# Patient Record
Sex: Female | Born: 1935
Health system: Southern US, Community
[De-identification: ages and names within clinical notes are randomized; demographics above are authoritative.]

## PROBLEM LIST (undated history)

## (undated) ENCOUNTER — Ambulatory Visit (HOSPITAL_COMMUNITY): Admission: EM | Source: Home / Self Care

## (undated) DIAGNOSIS — Z950 Presence of cardiac pacemaker: Secondary | ICD-10-CM

## (undated) DIAGNOSIS — I10 Essential (primary) hypertension: Secondary | ICD-10-CM

## (undated) DIAGNOSIS — I2510 Atherosclerotic heart disease of native coronary artery without angina pectoris: Secondary | ICD-10-CM

## (undated) DIAGNOSIS — I252 Old myocardial infarction: Secondary | ICD-10-CM

## (undated) DIAGNOSIS — N183 Chronic kidney disease, stage 3 unspecified: Secondary | ICD-10-CM

## (undated) DIAGNOSIS — Z87440 Personal history of urinary (tract) infections: Secondary | ICD-10-CM

## (undated) DIAGNOSIS — Z951 Presence of aortocoronary bypass graft: Secondary | ICD-10-CM

## (undated) DIAGNOSIS — Z955 Presence of coronary angioplasty implant and graft: Secondary | ICD-10-CM

## (undated) DIAGNOSIS — R55 Syncope and collapse: Secondary | ICD-10-CM

## (undated) DIAGNOSIS — E039 Hypothyroidism, unspecified: Secondary | ICD-10-CM

## (undated) DIAGNOSIS — E785 Hyperlipidemia, unspecified: Secondary | ICD-10-CM

## (undated) DIAGNOSIS — I480 Paroxysmal atrial fibrillation: Secondary | ICD-10-CM

## (undated) HISTORY — DX: Syncope and collapse: R55

## (undated) HISTORY — PX: TOTAL HIP ARTHROPLASTY: SHX124

## (undated) HISTORY — DX: Presence of coronary angioplasty implant and graft: Z95.5

## (undated) HISTORY — DX: Hypothyroidism, unspecified: E03.9

## (undated) HISTORY — DX: Chronic kidney disease, stage 3 unspecified: N18.30

## (undated) HISTORY — PX: THYROIDECTOMY: SHX17

## (undated) HISTORY — PX: CHOLECYSTECTOMY: SHX55

## (undated) HISTORY — PX: JOINT REPLACEMENT: SHX530

## (undated) HISTORY — DX: Hyperlipidemia, unspecified: E78.5

## (undated) HISTORY — DX: Chronic kidney disease, stage 3 (moderate): N18.3

## (undated) HISTORY — DX: Paroxysmal atrial fibrillation: I48.0

---

## 1997-08-11 ENCOUNTER — Ambulatory Visit (HOSPITAL_COMMUNITY): Admission: RE | Admit: 1997-08-11 | Discharge: 1997-08-11 | Payer: Self-pay

## 1997-10-05 ENCOUNTER — Ambulatory Visit (HOSPITAL_BASED_OUTPATIENT_CLINIC_OR_DEPARTMENT_OTHER): Admission: RE | Admit: 1997-10-05 | Discharge: 1997-10-05 | Payer: Self-pay | Admitting: *Deleted

## 1998-08-06 ENCOUNTER — Emergency Department (HOSPITAL_COMMUNITY): Admission: EM | Admit: 1998-08-06 | Discharge: 1998-08-06 | Payer: Self-pay | Admitting: Emergency Medicine

## 1999-11-30 ENCOUNTER — Encounter: Admission: RE | Admit: 1999-11-30 | Discharge: 1999-11-30 | Payer: Self-pay

## 2000-02-01 ENCOUNTER — Encounter: Admission: RE | Admit: 2000-02-01 | Discharge: 2000-02-01 | Payer: Self-pay

## 2001-02-15 ENCOUNTER — Emergency Department (HOSPITAL_COMMUNITY): Admission: EM | Admit: 2001-02-15 | Discharge: 2001-02-15 | Payer: Self-pay | Admitting: Emergency Medicine

## 2001-07-29 ENCOUNTER — Ambulatory Visit (HOSPITAL_COMMUNITY): Admission: RE | Admit: 2001-07-29 | Discharge: 2001-07-29 | Payer: Self-pay | Admitting: *Deleted

## 2001-07-29 ENCOUNTER — Encounter (INDEPENDENT_AMBULATORY_CARE_PROVIDER_SITE_OTHER): Payer: Self-pay | Admitting: Specialist

## 2002-01-13 ENCOUNTER — Encounter: Admission: RE | Admit: 2002-01-13 | Discharge: 2002-01-13 | Payer: Self-pay

## 2003-01-31 ENCOUNTER — Inpatient Hospital Stay (HOSPITAL_COMMUNITY): Admission: RE | Admit: 2003-01-31 | Discharge: 2003-02-03 | Payer: Self-pay | Admitting: Orthopaedic Surgery

## 2003-02-03 ENCOUNTER — Inpatient Hospital Stay (HOSPITAL_COMMUNITY)
Admission: RE | Admit: 2003-02-03 | Discharge: 2003-02-11 | Payer: Self-pay | Admitting: Physical Medicine & Rehabilitation

## 2004-01-25 ENCOUNTER — Encounter (INDEPENDENT_AMBULATORY_CARE_PROVIDER_SITE_OTHER): Payer: Self-pay | Admitting: *Deleted

## 2004-01-25 ENCOUNTER — Ambulatory Visit (HOSPITAL_COMMUNITY): Admission: RE | Admit: 2004-01-25 | Discharge: 2004-01-25 | Payer: Self-pay | Admitting: *Deleted

## 2004-02-26 DIAGNOSIS — Z951 Presence of aortocoronary bypass graft: Secondary | ICD-10-CM

## 2004-02-26 HISTORY — PX: CORONARY ARTERY BYPASS GRAFT: SHX141

## 2004-02-26 HISTORY — DX: Presence of aortocoronary bypass graft: Z95.1

## 2004-04-23 ENCOUNTER — Inpatient Hospital Stay (HOSPITAL_COMMUNITY): Admission: EM | Admit: 2004-04-23 | Discharge: 2004-05-09 | Payer: Self-pay | Admitting: Emergency Medicine

## 2004-04-25 ENCOUNTER — Encounter (INDEPENDENT_AMBULATORY_CARE_PROVIDER_SITE_OTHER): Payer: Self-pay | Admitting: Cardiology

## 2004-05-29 ENCOUNTER — Encounter: Admission: RE | Admit: 2004-05-29 | Discharge: 2004-05-29 | Payer: Self-pay | Admitting: Cardiothoracic Surgery

## 2004-06-04 ENCOUNTER — Encounter (HOSPITAL_COMMUNITY): Admission: RE | Admit: 2004-06-04 | Discharge: 2004-09-02 | Payer: Self-pay | Admitting: Cardiology

## 2004-06-28 ENCOUNTER — Encounter
Admission: RE | Admit: 2004-06-28 | Discharge: 2004-06-28 | Payer: Self-pay | Admitting: Thoracic Surgery (Cardiothoracic Vascular Surgery)

## 2005-07-25 ENCOUNTER — Emergency Department (HOSPITAL_COMMUNITY): Admission: EM | Admit: 2005-07-25 | Discharge: 2005-07-25 | Payer: Self-pay | Admitting: Family Medicine

## 2006-09-29 ENCOUNTER — Emergency Department (HOSPITAL_COMMUNITY): Admission: EM | Admit: 2006-09-29 | Discharge: 2006-09-29 | Payer: Self-pay | Admitting: Pediatrics

## 2008-07-07 ENCOUNTER — Encounter (INDEPENDENT_AMBULATORY_CARE_PROVIDER_SITE_OTHER): Payer: Self-pay | Admitting: *Deleted

## 2008-07-07 ENCOUNTER — Ambulatory Visit (HOSPITAL_COMMUNITY): Admission: RE | Admit: 2008-07-07 | Discharge: 2008-07-07 | Payer: Self-pay | Admitting: *Deleted

## 2008-11-29 HISTORY — PX: NM MYOVIEW LTD: HXRAD82

## 2009-07-06 ENCOUNTER — Encounter: Admission: RE | Admit: 2009-07-06 | Discharge: 2009-07-06 | Payer: Self-pay | Admitting: Internal Medicine

## 2010-02-25 HISTORY — PX: CAROTID STENT: SHX1301

## 2010-07-10 NOTE — Op Note (Signed)
NAME:  Stephanie Atkinson, Stephanie Atkinson                 ACCOUNT NO.:  [id number]   MEDICAL RECORD NO.:  [id number]          PATIENT TYPE:  AMB   LOCATION:  ENDO                         FACILITY:  [facility]   PHYSICIAN:  Georgiana Spinner, M.D.    DATE OF BIRTH:  19-Jul-1924   DATE OF PROCEDURE:  07/07/2008  DATE OF DISCHARGE:                               OPERATIVE REPORT   PROCEDURE:  Upper endoscopy with biopsy.   ENDOSCOPIST:  Georgiana Spinner, M.D.   INDICATIONS:  Barrett's esophagus.   ANESTHESIA:  Fentanyl 50 mcg, Versed 5 mg.   DESCRIPTION OF PROCEDURE:  With the patient mildly sedated in the left  lateral decubitus position, the Pentax videoscopic endoscope was  inserted in the mouth and passed under direct vision through the  esophagus, which appeared normal.  We entered into the stomach and  placed the endoscope in a  retroflexed view.  We could see that there  was an area of Barrett's esophagus and actually some small areas above  the squamocolumnar junction line.  This was photographed and biopsied in  retroflex view.  The endoscope was then straightened and advanced  through the stomach, fundus, body and antrum.  The duodenal bulb was  visualized, as was the second portion of the duodenum.  All appeared  normal.  From this point the endoscope was slowly withdrawn, taking  circumferential views of the duodenal mucosa, until the endoscope was  pulled back into the stomach, placed in retroflexion to view the stomach  from below. The endoscope was straightened and withdrawn, taking  circumferential views of the remaining gastric and esophageal mucosa.  The patient's vital signs and pulse oximeter remained stable.   The patient tolerated procedure well without apparent complications.   FINDINGS:  Barrett's esophagus was biopsied in the retroflexed view.  Otherwise unremarkable exam.   PLAN:  Await biopsy report.  The patient will call me for results and  follow up with me as an outpatient.        ______________________________  Georgiana Spinner, M.D.     GMO/MEDQ  D:  07/07/2008  T:  07/07/2008  Job:  [id number]

## 2010-07-13 NOTE — Op Note (Signed)
NAME:  Stephanie Atkinson, Stephanie Atkinson                 ACCOUNT NO.:  [id number]   MEDICAL RECORD NO.:  [id number]          PATIENT TYPE:  INP   LOCATION:                               FACILITY:  [facility]   PHYSICIAN:  Cristy Hilts. Jacinto Halim, MD       DATE OF BIRTH:  15-Oct-1924   DATE OF PROCEDURE:  DATE OF DISCHARGE:                                 OPERATIVE REPORT   PROCEDURE PERFORMED:  Persantine Cardiolite stress test, stress data only.   INDICATIONS FOR PROCEDURE:  Ms. Stephanie Atkinson is a 77 year old female with  history of hypertension who presents with chest discomfort.  Given her age  and chest discomfort, she was referred for cardiac stress testing for  evaluation of myocardial ischemia.   Resting ECG demonstrates normal sinus rhythm.   Resting heart rate is 66 beats per minute with a blood pressure of 173/73  mmHg.  With Persantine push the maximum heart rate was 96 beats per minute  with blood pressure of 160/61 mmHg.   With Persantine fusion, there was 2 mm downsloping ST segment depression  with T-wave inversion noted in the inferolateral leads suggestive of  myocardial ischemia.  These changes reverted back to baseline into recovery.   Patient did complain of chest discomfort at five minutes into Persantine  infusion which rapidly resolved with 75 mg of IV aminophylline infusion.   There are occasional PVCs.   FINDINGS:  1.  The patient with resting ECG demonstrating sinus rhythm.  Stress ECG was      positive for myocardial ischemia with development of 2 mm downsloping ST      segment depression in inferolateral leads.  2.  The Cardiolite portion of the stress test with nuclear imaging is      pending.  3.  Given the fact that the EKG response is positive for myocardial ischemia      with Persantine infusion.  This in itself should be considered a      moderate risk study.  Clinical correlation is necessary.      JRG/MEDQ  D:  04/25/2004  T:  04/25/2004  Job:  [id number]

## 2010-07-13 NOTE — Op Note (Signed)
NAME:  Stephanie Atkinson, Stephanie Atkinson                 ACCOUNT NO.:  [id number]   MEDICAL RECORD NO.:  [id number]          PATIENT TYPE:  AMB   LOCATION:  ENDO                         FACILITY:  [facility]   PHYSICIAN:  Georgiana Spinner, M.D.    DATE OF BIRTH:  October 09, 1924   DATE OF PROCEDURE:  DATE OF DISCHARGE:                                 OPERATIVE REPORT   PROCEDURE:  Upper endoscopy.   INDICATIONS:  GERD, with history of Barrett's esophagus by biopsy.   ANESTHESIA:  1.  Demerol 60.  2.  Versed 5 mg.   PROCEDURE:  With the patient mildly sedated in the left lateral decubitus  position, the Olympus videoscopic endoscope was inserted into the mouth and  passed under direct vision through the esophagus which appeared normal.  We  biopsied the distal esophagus, where the squamocolumnar junction was to look  for changes of Barrett's.  We entered into the stomach.  Fundus, body,  antrum, duodenal bulb, and second portion of the duodenum were visualized.  From this point, the endoscope was slowly withdrawn, taking circumferential  views of the duodenal mucosa until the endoscope until the endoscope had  been pulled back into the stomach, placed on retroflexion, and viewed the  stomach from below.  The endoscope was straightened and withdrawn, taking  circumferential views of the remaining gastric and esophageal mucosa.  The  patient's vital signs and pulse oximetry remained stable.  The patient  tolerated the procedure well, without apparent complications.   FINDINGS:  Biopsies of distal esophagus.  Await biopsy report.  The patient  will call me for results and follow up with me as an outpatient.  Proceed to  colonoscopy as planned.      GMO/MEDQ  D:  01/25/2004  T:  01/25/2004  Job:  [id number]

## 2010-07-13 NOTE — Procedures (Signed)
[facility]  Patient:    LETHER, TESCH Visit Number: [id number] MRN: [id number]          Service Type: END Location: ENDO Attending Physician:  Sabino Gasser Dictated by:   Sabino Gasser, M.D. Proc. Date: 07/29/01 Admit Date:  07/29/2001 Discharge Date: 07/29/2001                             Procedure Report  PROCEDURE:  Upper endoscopy.  INDICATIONS:  GERD.  ANESTHESIA:  Demerol 60 mg, Versed 6 mg.  DESCRIPTION OF PROCEDURE:  With the patient mildly sedated in the left lateral decubitus position, the Olympus videoscopic endoscope was inserted in the mouth and passed under direct vision through the esophagus.  The distal esophagus showed changes of esophagitis, which was biopsied.  We entered into the stomach; fundus, body, antrum, duodenal bulb and second portion of the duodenum were visualized.  From this point the endoscope was slowly withdrawn, taking circumferential views of the entire duodenal mucosa, until the endoscope had been pulled back and the stomach placed in retroflexion to view the stomach from below.  The endoscope was then straightened and withdrawn, taking circumferential views of the remaining gastric and esophageal mucosa, stopping to photograph and biopsy erythema of the body of the stomach.  The remainder of the examination was unremarkable.  The patients vital signs and pulse oximetry remained stable.  The patient tolerated the procedure well without apparent complications.  FINDINGS:  Erythema of body of stomach, biopsied.  Esophagitis biopsied. PLAN:  Await biopsy report.  The patient will call me for results and follow-up with me as an outpatient. Dictated by:   Sabino Gasser, M.D. Attending Physician:  Sabino Gasser DD:  07/29/01 TD:  07/31/01 Job: [id number]

## 2010-07-13 NOTE — Procedures (Signed)
[facility]  Patient:    Stephanie Atkinson, Stephanie Atkinson Visit Number: [id number] MRN: [id number]          Service Type: END Location: ENDO Attending Physician:  Sabino Gasser Dictated by:   Sabino Gasser, M.D. Admit Date:  07/29/2001 Discharge Date: 07/29/2001                             Procedure Report  PROCEDURE:  Colonoscopy.  SURGEON:  Sabino Gasser, M.D.  INDICATIONS:  Colon polyps and rectal bleeding.  ANESTHESIA:  Versed 2 mg.  DESCRIPTION OF PROCEDURE:  With the patient mildly sedated in the left lateral decubitus position, the Olympus videoscopic colonoscope was inserted in the rectum and passed under direct vision to the cecum, identified by the ileocecal valve and appendiceal orifice.  From this point, the colonoscope was slowly withdrawn, taking circumferential views of the entire colonic mucosa, stopping at approximately 25 cm from the anal verge, at which point, a large polyp was seen, photographed, and removed using snare cautery technique with a setting of 20/20 blunted current.  The base was photographed and seemed to be clean.  The endoscope was then withdrawn all the way to the rectum, stopping one fold from the rectum, at which point, a small flat polyp was seen, and removed using hot biopsy forceps technique with a setting of 20/20 blunted current.  In the rectum, the endoscope was placed in retroflexion to view the anal canal from above.  The endoscope was straightened and withdrawn.  The patients vital signs and pulse oximeter remained stable.  The patient tolerated the procedure well and without apparent complications.  FINDINGS:  Internal hemorrhoids and polyps of rectosigmoid and sigmoid areas, await biopsy report.  The patient will call me for results and follow up with me as an outpatient. Dictated by:   Sabino Gasser, M.D. Attending Physician:  Sabino Gasser DD:  07/29/01 TD:  07/31/01 Job: [id number]

## 2010-07-13 NOTE — Discharge Summary (Signed)
NAME:  Stephanie Atkinson, Stephanie Atkinson                 ACCOUNT NO.:  [id number]   MEDICAL RECORD NO.:  [id number]          PATIENT TYPE:  INP   LOCATION:  2014                         FACILITY:  [facility]   PHYSICIAN:  Evelene Croon, M.D.     DATE OF BIRTH:  1924-11-25   DATE OF ADMISSION:  04/23/2004  DATE OF DISCHARGE:  05/09/2004                                 DISCHARGE SUMMARY   ADDENDUM:  Stephanie Atkinson planned discharge for March 13th was held due to development of  atrial fibrillation.  She had an episode of atrial fibrillation with a rapid  ventricular rate in the overnight hours of postoperative day #3.  She was  started on IV amiodarone with rapid conversion to normal sinus rhythm.  She  maintained a normal sinus rhythm until the late morning on postoperative day  #5.  She again had an episode of atrial fibrillation with ventricular rate  in the 130s.  This lasted approximately one hour.  She converted back to a  normal sinus rhythm.  Her Toprol was increased to 50 mg q.d.  Her amiodarone  had been switched from IV to amiodarone the day before, and this was  continued on p.o. amiodarone 400 mg b.i.d.  Stephanie Atkinson converted back to  normal sinus rhythm at approximately 11:30 on postoperative day #5, March  14th.  She remained in normal sinus rhythm during the day and overnight  again.   On May 09, 2004, postoperative day #6, Stephanie Atkinson on morning rounds  reported feeling very well.  Her vital signs remained with a blood pressure  of 159/74.  She is afebrile.  Room-air saturation is 96%.  Her heart is  maintained in a normal sinus rhythm at 71 beats per minute.  Lungs with a  few crackles at the left base, otherwise clear.  She continues to tolerate  her diet.  No bowel or bladder complaints.  Her incision has continued to  heal well.  She did develop some right forearm cellulitis at the site of a  probable IV infiltrate.  She was started on a short course of Cipro (she is  allergic to PENICILLIN and  SULFA).   Stephanie Atkinson was discharged home today, May 09, 2004.   DISCHARGE MEDICATIONS:  1.  Enteric-coated aspirin 325 mg q.d.  2.  Toprol XL 50 mg q.d.  3.  Zocor 40 mg q.d.  4.  Isosorbide mononitrate 30 mg q.d. x1 month.  5.  Amiodarone 400 mg b.i.d. until March 20th, then 400 mg q.d.  6.  Cipro 250 mg q.12h. x5 days.   All other discharge instructions and appointments remain as per the prior  dictation.      CTK/MEDQ  D:  05/09/2004  T:  05/09/2004  Job:  [id number]   cc:   [facility]   Areatha Keas, M.D.  [address]  [address]  [place]  [place] [address]  Fax: [phone]

## 2010-07-13 NOTE — Cardiovascular Report (Signed)
NAME:  Stephanie Atkinson, Stephanie Atkinson                 ACCOUNT NO.:  [id number]   MEDICAL RECORD NO.:  [id number]          PATIENT TYPE:  INP   LOCATION:  3740                         FACILITY:  [facility]   PHYSICIAN:  Darlin Priestly, MD  DATE OF BIRTH:  03-May-1924   DATE OF PROCEDURE:  04/27/2004  DATE OF DISCHARGE:                              CARDIAC CATHETERIZATION   PROCEDURES:  1.  Left-heart catheterization.  2.  Coronary angiography.  3.  Left ventriculogram.  4.  Abdominal aortogram.   PHYSICIAN:  Dr. Lenise Herald.   COMPLICATIONS:  None.   HISTORY:  Ms. Osment is a 77 year old female, patient of Dr. Hettie Holstein,  with a history of hypertension, who was admitted on 04/23/2004 with  substernal chest pain.  She was subsequently ruled out for myocardial  infarction.  She did have a Cardiolite scan revealing apical ischemia as  well an anteroseptal ischemia.  She is now referred for cardiac  catheterization to rule out significant coronary artery disease.   DESCRIPTION OF PROCEDURE:  After giving informed written consent, the  patient was brought to the cardiac catheterization laboratory.  The groin  was shaved and prepped in the usual sterile fashion.  ECG monitoring was  established.  Using the modified Seldinger technique, a #6 French arterial  sheath was inserted in the right femoral artery.  The 6-French diagnostic  catheter was used to perform diagnostic angiography.   FINDINGS:  1.  The left main is main is a medium-sized vessel with no significant      disease.  2.  The LAD is a large vessel that courses the apex and one diagonal branch.      The LAD is noted to be calcified in its proximal segment with 60% ostial      and proximal and 95% proximal stenosis.  The remainder of the LAD has no      significant disease.  3.  The first diagonal is a small vessel which is irregular but has no      significant stenosis.  4.  The left circumflex is a medium-size vessel that courses in  the AV      groove, and gives off three obtuse marginal branches.  The AV groove of      the circumflex has an 80% early mid and 60% late mid stenosis after      takeoff of the first and second obtuse marginals.  5.  The first OM is a large vessel which bifurcates distally with an 80%      ostial and 70% proximal lesion.  6.  The second OM is a medium-size vessel with no significant disease.  7.  The third OM is a medium-size vessel which bifurcates distally with no      significant disease.  8.  The right coronary artery is a large vessel which is dominant and gives      rise to a PDA as well as a posterolateral branch.  The RCA has a 60%      late and 70% distal stenosis prior to the bifurcation.  The PDA and      posterolateral branch are small-to-medium size vessels with 50-60% mid      vessel stenosis.  9.  The left ventriculogram has preserved ejection fraction of 70%.  10. Abdominal aortogram reveals no evidence of significant renal artery      stenosis.  There does appear to be a small infrarenal abdominal      aneurysm.   HEMODYNAMICS:  Systemic arterial pressure 163/57, LV systemic pressure  164/14, LVEDP of 24.   CONCLUSION:  1.  Significant three-vessel coronary artery disease.  2.  Normal left-ventricular systolic function.  3.  No evidence of significant renal artery stenosis.  4.  Small infrarenal abdominal aortic aneurysm.  5.  Systemic hypertension.  6.  Elevated left ventricular end diastolic pressure.      RHM/MEDQ  D:  04/27/2004  T:  04/28/2004  Job:  [id number]   cc:   Hettie Holstein, D.O.

## 2010-07-13 NOTE — Op Note (Signed)
NAME:  Stephanie Atkinson, Stephanie Atkinson                           ACCOUNT NO.:  [id number]   MEDICAL RECORD NO.:  [id number]                   PATIENT TYPE:  INP   LOCATION:  2899                                 FACILITY:  [facility]   PHYSICIAN:  Mark C. Ophelia Charter, M.D.                 DATE OF BIRTH:  11-08-1924   DATE OF PROCEDURE:  01/31/2003  DATE OF DISCHARGE:                                 OPERATIVE REPORT   PREOPERATIVE DIAGNOSIS:  Right hip osteoarthritis.   POSTOPERATIVE DIAGNOSIS:  Right hip osteoarthritis.   OPERATION PERFORMED:  Right total hip arthroplasty.   SURGEON:  Mark C. Ophelia Charter, M.D.   ASSISTANT:  Genene Churn. Denton Meek.   ANESTHESIA:  General orotracheal anesthesia.   ESTIMATED BLOOD LOSS:  200 mL.   COMPONENTS USED:  52 mm Trident [employer]onics cup, 32 mm +5 ball, Crossfire  poly, 10 degree wall.  #6 cemented ODC stem, Stryker cement.   DESCRIPTION OF PROCEDURE:  After induction of general anesthesia,  preoperative Ancef prophylaxis, the patient was placed in lateral position,  standard prepping and draping was performed.  Impervious stockinette and  Coban, usual hip sheets, drapes, sterile skin marker and Betadine Vi-drape  times two was used to seal the skin and groin.  The posterior approach was  made.  The tensor fascia was split in line with its fibers.  The gluteus  maximus was split in line with its fibers.  Charnley retractor placed.  Sciatic nerve palpated, preserved and the Charnley retractor grabbed the  inferior portions of the gluteus maximus muscle.  The piriformis was tagged  and then cut with #1 Tycron suture.  The posterior capsule was excised.  A  large Steinmann pin was placed laterally in the pelvis underneath the  gluteus medius muscle and a parallel pin in the greater trochanter measuring  at 57 mm.  The hip was then dislocated and the neck cut.  Sequential  reaming, broaching, trochanteric reaming, calcar reaming was performed and  #6 was appropriate to fill the  canal.  Small sponge placed.   Acetabulum reamed sequentially up to 52.  Marginal osteophytes were trimmed  and the labrum was removed.  Transverse acetabular ligament was divided.  Trident cup was inserted, impacted into place.  After trials had been used  which showed excellent stability.  After permanent central screw plug was  placed followed by the liner, cement was vacuum mixed, compacted with a glue  gun, pressurized and a 13 mm distal tip was applied with a #4 canal  restrictor.  Cement was hardened 15 minutes and a +5 ball was popped on and  leg length measurements showed that she had been lengthened to 64 which  corrected her shortness that she had preoperatively.  There was flexion in  90 degrees, internal rotation to 90 degrees without subluxation.  Full  extension, quad was not tight, no trochanteric  impingement.  Good stability with extension, external rotation.  Piriformis  was repaired back to gluteus medius tendon.  Tensor fascia repaired with  Tycron followed by 0 Vicryl in the gluteus maximus, 2-0 Vicryl, subcutaneous  tissue, skin staple closure, postop dressing and knee immobilizer.  Instrument count, needle count was correct.                                               Mark C. Ophelia Charter, M.D.    MCY/MEDQ  D:  01/31/2003  T:  02/01/2003  Job:  [id number]

## 2010-07-13 NOTE — Op Note (Signed)
NAME:  Stephanie Atkinson, Stephanie Atkinson                 ACCOUNT NO.:  [id number]   MEDICAL RECORD NO.:  [id number]          PATIENT TYPE:  AMB   LOCATION:  ENDO                         FACILITY:  [facility]   PHYSICIAN:  Georgiana Spinner, M.D.    DATE OF BIRTH:  [birth year]   DATE OF PROCEDURE:  01/25/2004  DATE OF DISCHARGE:                                 OPERATIVE REPORT   PROCEDURE PERFORMED:  Colonoscopy.   ENDOSCOPIST:  Georgiana Spinner, M.D.   INDICATIONS FOR PROCEDURE:  Colon polyps.   ANESTHESIA:  Demerol 10 mg, Versed 1 mg.   DESCRIPTION OF PROCEDURE:  With the patient mildly sedated in the left  lateral decubitus position, the Olympus videoscopic colonoscope was inserted  in the rectum and passed under direct vision through a tortuous diverticula  filled sigmoid colon to reach the cecum, identified by the base of cecum,  ileocecal valve, both of which were photographed.  From this point the  colonoscope was slowly withdrawn taking circumferential views of the colonic  mucosa stopping only in the rectum which appeared normal on direct and  retroflex view.  The endoscope was straightened and withdrawn.  The  patient's vital signs and pulse oximeter remained stable.  The patient  tolerated the procedure well without apparent complications.   FINDINGS:  Diverticulosis of the sigmoid colon.  Otherwise unremarkable  examination.   PLAN:  Consider repeat examination possibly in five years.      GMO/MEDQ  D:  01/25/2004  T:  01/25/2004  Job:  [id number]

## 2010-07-13 NOTE — Consult Note (Signed)
NAME:  Stephanie Atkinson, Stephanie Atkinson                 ACCOUNT NO.:  [id number]   MEDICAL RECORD NO.:  [id number]          PATIENT TYPE:  INP   LOCATION:  3740                         FACILITY:  [facility]   PHYSICIAN:  Evelene Croon, M.D.     DATE OF BIRTH:  1924-11-23   DATE OF CONSULTATION:  04/29/2004  DATE OF DISCHARGE:                                   CONSULTATION   REFERRING PHYSICIAN:  Dr. Lenise Herald.   REASON FOR CONSULTATION:  Severe three-vessel coronary disease and unstable  angina.   HISTORY OF PRESENT ILLNESS:  This patient is a 77 year old woman who is very  active and walks daily.  She was asymptomatic until last Sunday, when she  developed severe substernal tightness and pressure which radiated up into  her shoulders and down both arms to her wrists.  This lasted for 2-3 minutes  and resolved, but left her very weak.  About 5 minutes later,  she has a  second episode of similar length and severity.  This resolved after a few  minutes and left her still very weak.  She sat there for a while and the  weakness resolved.  She discussed this with her daughter, who recommended  that she come to the hospital.  She had no acute EKG changes on  presentation.  Her CPK enzymes were negative.  Troponin levels were  negative.  She subsequently underwent a stress Cardiolite exam which was  positive and cardiac catheterization was performed by Dr. Jenne Campus on [facility] 3,  2006.  This showed severe three-vessel disease.  The patient has 60%  proximal LAD stenosis and then a calcified 95% stenosis.  The left  circumflex had large first marginal that had an 80% ostial and 70% proximal  stenosis.  There was then an 80% stenosis in the left circumflex before a  smaller second marginal branch.  There was then a 60% left circumflex  stenosis before a very small third marginal branch.  The right coronary had  60% proximal and 70% distal stenosis.  The posterior descending had about  50% proximal stenosis.  There  was about 60% distal right coronary stenosis  before the posterolateral branch which was small.  Left ventricular ejection  fraction was about 70%.  There is a small infrarenal saccular abdominal  aortic aneurysm.  There is no gradient across the aortic valve and no mitral  regurgitation.   REVIEW OF SYSTEMS:  Her review of systems is as follows:  GENERAL: She  denies any fever or chills.  She has had no recent weight changes.  EYES:  Negative.  ENT:  She has chronic sinus problems.  She wears lower dentures.  CARDIOVASCULAR:  She denies any previous chest pain or pressure.  She has  had no exertional dyspnea.  She denies orthopnea and PND.  She has had no  peripheral edema or palpitations.  RESPIRATORY:  She denies cough and sputum  production.  GI:  She has a history of diverticulosis found on colonoscopy.  She has some problems with constipation and gastroesophageal reflux.  She  has a history  of esophagitis and history of Barrett's esophagus found by  biopsy in November 2005.  She has had a history of gastritis with positive  H. pylori in June 2003.  GU:  She has nocturia symptoms, getting up several  times per night to urinate.  She has a history of urinary tract infection.  MUSCULOSKELETAL:  She has osteoarthritis, particularly in her hips, and has  undergone previous right hip arthroplasty in December 2004.  NEUROLOGICAL:  She denies any focal weakness or numbness.  She denies dizziness and  syncope.  She never had a TIA or stroke.  Neurological negative.  HEMATOLOGICAL:  Negative.   ALLERGIES:  To PENICILLIN and SULFA.   PAST MEDICAL HISTORY:  Her past medical history is significant for a right  hip arthroplasty in December of 2004.  She is status post cholecystectomy  and status post surgery for hemorrhoids.  She is status post right shoulder  arthroscopy.  She has a history of polyp removal from her rectosigmoid and  sigmoid colon.  She is status post bilateral leg saphenous  vein strippings  for varicose vein disease many years ago.  She has a  history of  hypertension and hyperlipidemia.  She denies diabetes.   SOCIAL HISTORY:  She is retired.  She has never smoked and does not drink  alcohol.  She is a widow and lives alone.   MEDICATIONS PRIOR TO ADMISSION:  1.  Norvasc 10 mg daily.  2.  Ultram 50 mg b.i.d.  3.  Aleve p.r.n.  4.  Metamucil p.r.n.[name]   FAMILY HISTORY:  Is positive for coronary disease.  Her mother died suddenly  at age 14.   PHYSICAL EXAMINATION:  VITAL SIGNS:  Her blood pressure 114/62.  Her pulse  is 65 regular.  Respiratory rate is 18 and unlabored.  GENERAL:  She is well-  developed black female in no distress.  HEENT:  Exam shows her to be normocephalic and atraumatic.  Pupils are equal  and reactive to light accommodation.  Extraocular muscles are intact.  Her  throat is clear.  NECK:  Exam shows normal carotid pulses bilaterally.  There are no bruits.  There is no adenopathy or thyromegaly.  CARDIAC:  Exam shows a regular rate and rhythm with normal S1-S2.  There is  no murmur or gallop.  LUNGS:  Her lungs are clear.  ABDOMEN:  Abdominal exam shows active bowel sounds. Abdomen is soft and  nontender.  There are no palpable masses or organomegaly.  EXTREMITIES:  Extremity exam shows no peripheral edema.  Pedal pulses are  palpable bilaterally.  There are old well-healed faint incisions in the  groins, adjacent to the knees and in the ankles bilaterally from vein  stripping.  SKIN:  Her skin is warm and dry.  NEUROLOGIC:  Exam shows her to be alert and oriented x3.  Motor and sensory  exams are grossly normal.   LABORATORY EXAMINATION:  Shows a hemoglobin of 13.9 and platelet count of  287,000, white blood cell count 5.3.  Coagulation profile is normal.  Electrolytes are normal with BUN of 11 and creatinine 1.0.  The liver  function profile is within normal limits.  Albumin was 3.5.  Her total cholesterol was 234 with  triglycerides of 72, HDL of 56 and an LDL of 164.   Chest x-ray shows no active disease.   Electrocardiogram shows sinus bradycardia with possible old septal infarct.   IMPRESSION:  This patient has severe three-vessel coronary disease with  unstable anginal symptoms on presentation.  She has normal left ventricular  function.  I agree that coronary bypass graft surgery is the best treatment  for her to prevent further ischemia and infarction and allow her to resume  an active lifestyle.  She has undergone bilateral lower extremity vein  stripping which I suspect that the greater saphenous vein.  We will have the  vascular laboratory perform vein mapping and if this saphenous vein has been  removed, then we will have to make plans to use bilateral internal mammary  grafts and possibly a radial artery graft.  I have discussed the operative  procedure of coronary bypass surgery with her including alternatives,  benefits, and risks including bleeding, blood transfusion, infection,  stroke, myocardial infarction, graft failure, and death.  She understands  and would like proceed with surgery as soon as possible.  We will plan to do  this on Thursday, May 03, 2004.      BB/MEDQ  D:  04/29/2004  T:  04/30/2004  Job:  [id number]   cc:   Darlin Priestly, MD  [phone] [address], [address]  [place]  [place] [address]  Fax: [phone]   Areatha Keas, M.D.  [address]  [address]  [place]  [place] [address]  Fax: [phone]

## 2010-07-13 NOTE — Discharge Summary (Signed)
NAME:  Stephanie Atkinson, Stephanie Atkinson                 ACCOUNT NO.:  [id number]   MEDICAL RECORD NO.:  [id number]          PATIENT TYPE:  INP   LOCATION:  2014                         FACILITY:  [facility]   PHYSICIAN:  Evelene Croon, M.D.     DATE OF BIRTH:  1925-02-07   DATE OF ADMISSION:  04/23/2004  DATE OF DISCHARGE:  05/07/2004                                 DISCHARGE SUMMARY   ADMISSION DIAGNOSIS:  Chest pain.   PAST MEDICAL HISTORY:  1.  Hypertension.  2.  Osteoarthritis.   PAST SURGICAL HISTORY:  1.  Includes upper and lower endoscopy November of 2005, diagnosed with      diverticulosis.  2.  Right hip arthroplasty December of 2004.  3.  Cholecystectomy.  4.  Bilateral vein stripping.   This patient has allergies to penicillin and sulfa.   DISCHARGE DIAGNOSES:  Severe three vessel coronary artery disease and  unstable angina, status post coronary artery bypass graft.   BRIEF HISTORY:  Stephanie Atkinson is an active 9 year old African American female.  On November 22, 2003, she developed substernal tightness and pressure which  radiated up to her shoulders and down both arms and wrists.  This resolved  after few minutes however, she still felt very weak.  Her daughter  recommended coming to the hospital.  She presented to the [facility]  Emergency Department. On arrival, she had no acute EKG changes.  CPK and  troponin levels were negative.  She does have a strong family history for  coronary disease.  She was evaluated in the emergency department by Dr.  Garnette Czech who recommended admission for 23 hour observation to rule out acute  cardiac event.   HOSPITAL COURSE:  On April 23, 2004, Stephanie Atkinson was admitted to [facility] under the care of the [facility] team.  Dr. Garnette Czech  requested cardiology consultation and she was evaluated on April 24, 2004  by Dr. Elsie [place] from [facility] and [facility].  He recommended  proceeding with a stress Cardiolite exam.  This  was performed and it was  positive.  He then recommended cardiac catheterization.  This was performed  by Dr. Jenne Campus on April 27, 2004.  Catheterization revealed severe two  vessel coronary artery disease with an ejection fraction of approximately  70%.  As the lesions were not amenable to PCI, cardiac surgery consultation  was requested.  She was evaluated later that day by Dr. Evelene Croon.  After  examination of the patient and in view of all the available records, Dr.  Laneta Simmers agreed that coronary artery bypass grafting for further treatment for  this woman to prevent further ischemia and infarction and allow her to  resume her active lifestyle.  This procedure, the risks and benefits were  all discussed with [name] and she agreed to proceed with surgery.  Because of her history of bilateral lower extremity vein stripping, Dr.  Laneta Simmers planned for preoperative vein mapping to see if there was any  available vein conduit for a bypass.  If not, his plan was to use bilateral  internal  mammary artery grafts as well as a radial artery graft.  Again,  these procedures were discussed with [name] and she agreed to proceed  with surgery.   Preoperative arterial evaluation included carotid duplex exam which revealed  no significant coronary artery disease.  Her vein mapping was performed  preoperatively and revealed that she had no suitable vein for conduit.  Ms.  Bobak remained stable in the hospital while awaiting surgery.   On May 03, 2004, Ms. Both was brought to the operating room and  underwent the following surgical procedure with Dr. Evelene Croon.  Coronary  artery bypass grafting x4.  Grafts placed at the time of procedure:  Left  internal mammary artery was grafted to the left anterior descending artery.  Left radial artery was grafted in a sequential fashion to the OM1 and OM2  arteries, a free right internal mammary artery was grafted to the posterior  descending  artery.  She tolerated this procedure well, transferring in  stable condition to the SICU.  She remained hemodynamically stable in the  immediate postoperative period.  She was extubated several hours after  arrival in the intensive care unit and awoke from anesthesia neurologically  intact.  She required only routine care in the intensive care unit and was  transferred to unit 2000 on postoperative day #1.  [name]  postoperative course has been essentially uneventful.  She is making very  good progress in her recovery.   On May 06, 2004, on postoperative day #3 on morning rounds, [name]  reports feeling very well.  Her vital signs are stable with blood pressure  127/73.  She is afebrile and room air saturation is 95%.  Her heart is  maintaining normal sinus rhythm, 69 beats per minute.  Her lung sounds are  slightly decreased at the right base, otherwise clear.  Her chest x-ray  today also reveals a small right lung atelectasis and effusion which is  improving from last x-ray.  She is tolerating her diet, small amounts of her  diet.  No nausea.  Her bowel and bladder functions are within normal limits  for her.  Her incisions are all healing well, specifically her left hand is  neurovascularly intact.  She does have mild bilateral lower extremity edema.  She is ambulating in the hall with minimal assistance.  Her pain is well  controlled.  If Ms. Logiudice continues to progress in this manner, it is  anticipated she will be ready for discharge home tomorrow, May 07, 2004.   LABORATORY STUDIES:  On May 06, 2004, chemistries included a sodium of  141, potassium 4.3, BUN 13, creatinine 0.9, glucose 92.  On May 05, 2004  CBC: White blood cells 9.3, hemoglobin 9.6, hematocrit 27.6, platelets  141,000.   CONDITION ON DISCHARGE:  Improved.   INSTRUCTIONS ON DISCHARGE:   MEDICATIONS:  1.  Enteric-coated aspirin 325 mg daily.  2.  Toprol XL 25 mg daily.  3.  Zocor 40 mg  daily. 4.  Lasix 40 mg daily for five days.  5.  Potassium chloride 20 mEq daily for five days.  6.  Isosorbide mononitrate 30 mg daily for one month.  7.  For pain management, she may have Ultram one to two p.o. q.6h. for      moderate to severe pain or Tylenol 325 mg one to two p.o. q.4h. for mild      pain.   ACTIVITY:  She is asked to refrain from any driving  or heavy lifting,  pushing or pulling.  She is also instructed to continue her breathing  exercises and daily walking.   DIET:  She will continue to be on a low fat, low salt diet.   WOUND CARE:  She is to shower daily with mild soap and water.  If her  incision shows any signs of infection, she is to call Dr. Sharee Pimple office.   FOLLOW UP:  She is to follow up with Dr. Elsie Lincoln at [facility] and  Vascular Center in approximately two weeks.  The patient has been asked to  call to arrange that appointment and given the office phone number.   An appointment will be arranged for Ms. Enterline at the [facility] office in  approximately three weeks.  The office will call with the date and time for  that appointment.  She will also be asked to have a chest x-ray at  [facility] one hour prior to that appointment and bring her  chest x-ray for the [facility] physician to review.      CTK/MEDQ  D:  05/06/2004  T:  05/07/2004  Job:  [id number]   cc:   Evelene Croon, M.D.  [address]  [place]  [place] [address]  Fax: [phone]   Madaline Savage, M.D.  [phone] [address]  [place]  [place] [address]  Fax: [phone]   Areatha Keas, M.D.  [address]  [address] [place] [address] [address]  [place]  [place] [address]  Fax: [phone]

## 2010-07-13 NOTE — H&P (Signed)
NAME:  Stephanie Atkinson, Stephanie Atkinson                 ACCOUNT NO.:  [id number]   MEDICAL RECORD NO.:  [id number]          PATIENT TYPE:  INP   LOCATION:  1843                         FACILITY:  [facility]   PHYSICIAN:  Stephanie Atkinson, D.O.    DATE OF BIRTH:  [birth year]   DATE OF ADMISSION:  04/23/2004  DATE OF DISCHARGE:                                HISTORY & PHYSICAL   PRIMARY CARE PHYSICIAN:  Stephanie Atkinson, M.D.   CHIEF COMPLAINT:  Chest pain.   HISTORY OF PRESENT ILLNESS:  This is a  pleasant 77 year old African-  American female with no prior coronary history, in relatively good health,  independently living, who developed some vague chest pain across her chest  and shoulders this past evening that lasted approximately 5 minutes.  She  reports this radiated down to her thighs.  She felt these were cramping and  not like in characteristics.  This recurred again today at rest.  This  mainly involved her neck, jaw, chest, wrists also.  Does not involve her  lower extremities as before.  She said they subsided without intervention.  Again, she developed another episode in 10 minutes.  She took no treatments.  She denies any prior history of coronary artery disease or angina in the  past.  Her functional status is good.  She ambulates.  Her limiting factor  is the pain she has in her hips.  She is followed closely by Dr. Phylliss Atkinson at  [facility].   PAST MEDICAL HISTORY:  She denies diabetes.  She has had hypertension  diagnosed for the past six to seven years, on medications.  She has  osteoarthritis.   PAST SURGICAL HISTORY:  Upper endoscopy back in November 2005, as well as  colonoscopy.  She was found to have diverticulosis.  She had right hip  arthroplasty in December 2004.  History of cholecystectomy.   MEDICATIONS:  1.  Norvasc 10 mg daily.  2.  Ultram for pain.   No other medications.   ALLERGIES:  She reports PENICILLIN and SULFA allergies.   FAMILY HISTORY:   Noncontributory.   SOCIAL HISTORY:  She does not smoke or drink alcohol and lives alone.   REVIEW OF SYSTEMS:  She has had no nausea, vomiting, diarrhea, diaphoresis.  Only chest pain is as described above.  No abdominal pain.  No urinary  difficulties.  Swelling over the lower extremities is chronic.  Otherwise,  she has no other complaints.   PHYSICAL EXAMINATION:  VITAL SIGNS:  Blood pressure 160/86, heart rate 70,  respirations 16, O2 saturation 99% on room air.  GENERAL:  Well-developed, well-nourished appearing African-American female  in no acute distress.  HEENT:  Head is normocephalic and atraumatic.  Extraocular muscles intact.  NECK:  Supple.  No palpable mass or thyromegaly.  CHEST:  Clear to auscultation bilaterally.  Normal effort.  No dullness to  percussion.  CARDIOVASCULAR:  Normal S1 and S2.  ABDOMEN: Soft and nontender.  EXTREMITIES:  Lower extremities exhibit no edema, cyanosis, clubbing.  Peripheral pulses are symmetrical.  NEUROLOGIC:  Exam reveals her  to be euthymic, and affect is stable.   LABORATORY DATA AND OTHER STUDIES:  Chemistries revealed sodium 139,  potassium 4.5, BUN 18, creatinine pending.  INR 0.9.  LFTs were within  normal limits.  WBC 5.0, hemoglobin 13.9, platelet count 254, MCV 81.   EKG revealed normal sinus rhythm.   Chest x-ray is pending at this time, and we will review.   ASSESSMENT AND PLAN:  1.  Chest pain, suspect noncardiac.  2.  Hypertension, poorly controlled.  3.  Osteoarthritis.   PLAN:  Admit Ms. Stephanie Atkinson for 23-hour observation to rule out acute event by  cardiac markers.  If these are negative, she can be discharged home if her  clinical course remains stable.      ESS/MEDQ  D:  04/23/2004  T:  04/23/2004  Job:  [id number]   cc:   Stephanie Atkinson, M.D.  [address]  [address]  [place]  [place] [address]  Fax: [phone]

## 2010-07-13 NOTE — Op Note (Signed)
NAME:  Stephanie Atkinson, Stephanie Atkinson                 ACCOUNT NO.:  [id number]   MEDICAL RECORD NO.:  [id number]          PATIENT TYPE:  INP   LOCATION:  2309                         FACILITY:  [facility]   PHYSICIAN:  Evelene Croon, M.D.     DATE OF BIRTH:  Jul 14, 1924   DATE OF PROCEDURE:  05/03/2004  DATE OF DISCHARGE:                                 OPERATIVE REPORT   PREOPERATIVE DIAGNOSIS:  Severe three-vessel coronary artery disease.   POSTOPERATIVE DIAGNOSIS:  Severe three-vessel coronary artery disease.   OPERATIVE PROCEDURE:  1.  Median sternotomy.  2.  Extracorporeal circulation.  3.  Coronary artery bypass graft surgery x4 using a left internal mammary      artery graft to the left anterior descending coronary artery, with a      free right internal mammary artery graft to the posterior descending      coronary artery and a sequential left radial artery graft to the first      and second obtuse marginal branches of the left circumflex coronary      artery.   ATTENDING SURGEON:  Evelene Croon, M.D.   ASSISTANT:  Toribio Harbour, N.P.   ANESTHESIA:  General endotracheal.   CLINICAL HISTORY:  This patient is a 77 year old woman with no prior cardiac  history, who was admitted with chest and shoulder pain.  She underwent a  Cardiolite scan, which was positive.  She subsequently underwent a cardiac  catheterization, which showed 60% proximal LAD stenosis.  There was a 95%  calcified proximal to mid-LAD stenosis.  The left circumflex had large first  and medium second marginal branches.  The first marginal had about 80%  ostial and 70% proximal stenosis.  The left circumflex then had about 80%  stenosis before the second marginal branch.  The right coronary artery had  60% mid- and 70% midstenoses.  There was about 50% posterior descending  stenosis.  Left ventricular ejection fraction was about 70% with no mitral  regurgitation.  After review of the angiogram and examination of the  patient,  it was felt that coronary artery bypass graft surgery was the best  treatment.  The patient had a history of previous bilateral saphenous vein  strippings and vein mapping was performed, which showed no remaining  saphenous vein in her legs.  Upper extremity arterial Doppler examination  was normal, and therefore we planned to use a left radial artery graft.  We  also made plans to use bilateral internal mammary grafts.  I discussed the  operative procedure with the patient and her two daughters, including use of  bilateral mammary arteries and the left radial artery graft.  I discussed  the alternatives to surgery, benefits and risks, including bleeding, blood  transfusion, infection, stroke, myocardial infarction, graft failure and  death.  I also discussed the slightly increased risk of sternal nonhealing  from using bilateral internal mammary grafts and a small chances of  paresthesias or ischemia in the left hand from use of the radial artery  graft.  They understood and agreed to proceed.   OPERATIVE PROCEDURE:  The patient was taken to the operating room and placed  on the table in the supine position.  After induction of general  endotracheal anesthesia, a Foley catheter was placed in the bladder using  sterile technique.  Then the left arm was positioned out at 90 degrees.  Then the left arm, neck, chest, abdomen and both lower extremities were  prepped and draped in the usual sterile manner.  The left radial artery was  harvested first.  This was a medium-caliber vessel with active blood flow  through it.  Prior to transecting the artery, it was occluded with an  atraumatic vascular clamp and there was good pulsation in the radial artery  at the risk beyond the clamp.  Therefore, the artery was transected  proximally and distally and the ends oversewn with 2-0 silk suture ligature.  There was complete hemostasis.  The wound was irrigated with saline solution  and then closed in  layers.  The radial artery was then flushed with  papaverine-saline solution.   Then the chest was entered through a median sternotomy incision and the  pericardium opened in the midline.  Examination of the heart showed good  ventricular contractility.  The ascending aorta had no palpable plaques in  it.   Then the left internal mammary artery was harvested from the chest wall as a  pedicle graft.  This was a medium-caliber vessel with excellent blood flow  through it.  Then the right internal mammary graft was harvested.  This was  also a medium-caliber vessel with excellent blood flow through it.  It was  obvious that this was not going to be long enough to use as a pedicle graft,  and therefore it was harvested as a free graft.   Then the patient was heparinized and when an adequate activated clotting  time was achieved, the distal ascending aorta was cannulated using a 20  French aortic cannula for arterial inflow.  Venous outflow was achieved  using a two-stage venous cannula through the right atrial appendage.  An  antegrade cardioplegia and vent cannula was inserted in the aortic root.   The patient was placed on cardiopulmonary bypass and the distal coronary  arteries identified.  The LAD was an intramyocardial vessel that exited near  the apex of the heart.  Distally this vessel was a medium-caliber vessel.  The first and second marginal branches were both large, graftable vessels.  The right coronary artery was diffusely diseased.  The proximal portion of  the posterior descending artery was a large vessel that was suitable for  grafting.   Then the aorta was crossclamped and 500 mL of cold blood antegrade  cardioplegia was administered in the aortic root with quick arrest of the  heart.  Systemic hypothermia to 20 degrees Centigrade and topical  hypothermia with iced saline was used.  A temperature probe was placed in the septum and an insulating pad in the  pericardium.   The first distal anastomosis was performed to the first marginal branch.  The internal diameter was about 2 mm.  The conduit used was the left radial  artery graft, and this was anastomosed in a sequential side-to-side manner  using continuous 7-0 Prolene suture.  Flow was measured through the graft  and was excellent.   The second distal anastomosis was performed to the second marginal branch.  The internal diameter of this vessel was about 1.75 mm.  The conduit used  was the same segment of greater saphenous vein  and the anastomosis performed  in a sequential end-to-side manner using continuous 8-0 Prolene suture.  Flow was measured through the graft and was excellent.  Then a dose of  cardioplegia was given down the vein graft and in the aortic root.   The third distal anastomosis was performed to the posterior descending  coronary artery.  The internal diameter of this vessel was about 1.75 mm.  The conduit used was the free right internal mammary graft, and this was  anastomosed in an end-to-side manner using continuous 8-0 Prolene suture.  Flow was measured through the graft and was excellent.   The fourth distal anastomosis was performed to the distal portion of the  left anterior descending coronary artery.  The internal diameter of this  vessel was about 1.6 mm in this area.  The conduit used was the left  internal mammary graft, and this was brought through an opening in the left  pericardium anterior to the phrenic nerve.  It was anastomosed to the LAD in  an end-to-side manner using continuous 8-0 Prolene suture.  The pedicle was  tacked to the epicardium with 6-0 Prolene sutures.   Then the patient was rewarmed to 37 degrees Centigrade.  The two proximal  anastomoses were then performed directly to the aortic root in an end-to-  side manner using continuous 7-0 Prolene suture.  The clamps were removed  from the mammary artery pedicle.  There was rapid  warming of the ventricular  septum and the return of spontaneous ventricular fibrillation.  Then the  crossclamp was removed with a time of 102 minutes.  There was spontaneous  return of sinus rhythm.  The proximal and distal anastomoses appeared  hemostatic, the alignment of the grafts satisfactory.  Graft markers were  placed around the proximal anastomoses.  Two temporary right ventricular and  right atrial pacing wires were placed and brought through the skin.   When the patient had was rewarmed to 37 degrees Centigrade, she was weaned  from cardiopulmonary bypass on no inotropic agents.  Total bypass time was  124 minutes.  Cardiac function appeared excellent with a cardiac output of  4.5 L/min.  Protamine was given and the venous and the aortic cannulas were  removed without difficulty.  Hemostasis was achieved.  Four chest tubes were  placed with bilateral pleural tubes, a tube in the posterior pericardium and one in the anterior mediastinum.  The pericardium was reapproximated over  the heart.  The sternum was closed with #6 stainless steel wires.  The  fascia was closed with continuous #1 Vicryl suture.  Subcutaneous tissue was  closed with a continuous 2-0 Vicryl and the skin with 3-0 Vicryl  subcuticular closure.  The lower extremity vein harvest site was closed in  layers in a similar manner.  The sponge, needle and instrument counts were  correct according to the scrub nurse.  Dry sterile dressings were applied  over the incisions and around the chest tubes, which were hooked to  Pleuravac suction.  The patient remained hemodynamically stable and was  transported to the SICU in guarded but stable condition.      BB/MEDQ  D:  05/03/2004  T:  05/03/2004  Job:  [id number]   cc:   Darlin Priestly, MD  [address]  [place]  [place] [address]  Fax: [phone]   [facility]

## 2010-07-13 NOTE — Discharge Summary (Signed)
NAME:  Stephanie Atkinson, Stephanie Atkinson                           ACCOUNT NO.:  [id number]   MEDICAL RECORD NO.:  [id number]                   PATIENT TYPE:  IPS   LOCATION:  4143                                 FACILITY:  [facility]   PHYSICIAN:  Ranelle Oyster, M.D.             DATE OF BIRTH:  07-Jul-1924   DATE OF ADMISSION:  02/03/2003  DATE OF DISCHARGE:  02/11/2003                                 DISCHARGE SUMMARY   DISCHARGE DIAGNOSES:  1. Right total hip replacement.  2. Postoperative anemia.  3. Hypertension.  4. Chronic constipation.   HISTORY OF PRESENT ILLNESS:  Stephanie Atkinson is a 77 year old female with a  history of hypertension, right hip OA with end-stage changes who elected to  undergo right total hip replacement on December 6 by Dr. Ophelia Charter.  Postop with  partial weightbearing and on Coumadin for DVT prophylaxis.  Once therapy was  initiated, the patient was noted to be on total assist __________  60% pivot  transfer to chair.  Has had a syncopal episode on December 7.   PAST MEDICAL HISTORY:  1. Significant for hypertension.  2. Right hip OA.  3. Nocturia x3.  4. __________  in 1999.  5. Cholecystectomy in 1985.  6. Varicose vein stripping.  7. Excision of lipoma, neck.  8. Excision of colon polyp.   ALLERGIES:  PENICILLIN.   SOCIAL HISTORY:  Patient lives in a one-level home with no step at entry.  Was independent prior to admission with the use of a cane for the past few  weeks.  She does not use any tobacco or alcohol.   Stephanie Atkinson was admitted to rehab on 02/03/2003 for therapy to consist  of PT/OT.  On past admission, Lovenox was added, and Coumadin on a  therapeutic basis.  Blood pressure was monitored on a b.i.d. basis and  showed good control.  Patient with chronic constipation issues.  Multiple  laxatives have been used to conquer this.  The patient's right hip incision  is healing without any signs or symptoms of infection.  She is noted to have  continued with  some minimal serous drainage on the lower aspect of her  incision.  Staples remain intact.  Patient is to follow up with Dr. Ophelia Charter  next week for staple removal.   Labs checked past admission showed a hemoglobin of 10.3, hematocrit 30.4,  white count 5.8. Sodium 144, potassium 3.9, chloride 108, CO2 28, BUN 8,  creatinine 0.9, glucose 111.   Once therapy was initiated, patient was able to progress a long way.  She  has been moderated.  At the time of discharge, the patient's progress was  modified independent for transfers, modified independent for ambulation,  modified independent for ambulating 80-120 feet without difficulty.  She was  able to navigate stairs with minimal assist.  Patient was modified  independent for below-body care.  Required assistive equivalent for  lower  body secondary to hip precautions.  Patient was modified independent for  toileting as well as simple move management tests.  Further followup  therapies include home health PT/OT by home health services.  Also to follow  Coumadin through 03/03/2003.  Patient on 1 mg of Coumadin to start tomorrow.   On February 11, 2003, the patient is discharged to home.   DISCHARGE MEDICATIONS:  1. Norvasc 10 mg q.d.  2. Ferrous sulfate 225 mg b.i.d.  3. Coumadin 2 mg q.p.m. starting tomorrow.  4. Oxycodone IR 5-10 mg q.4-6h. p.r.n. pain.   ACTIVITY:  Partial weight, right lower extremity.  Use walker.  Follow hip  precautions.   WOUND CARE:  Keep the area clean and dry.  May wash with soap and water.   SPECIAL INSTRUCTIONS:  No alcohol.  No smoking.  No driving.  No aspirin or  aspirin-containing products.  May use Tylenol and/or Ultram as needed.  Ice  pack to right hip p.r.n.   FOLLOW UP:  Patient is to follow up with Dr. Ophelia Charter for a postop check next  week.  Follow up with Dr. Su Hilt and Dr. Riley Kill as needed.      Greg Cutter, P.A.                    Ranelle Oyster, M.D.    PP/MEDQ  D:  02/11/2003   T:  02/13/2003  Job:  [id number]   cc:   Dr. Channing Mutters   Dr. Ophelia Charter

## 2010-07-13 NOTE — Discharge Summary (Signed)
NAME:  Stephanie Atkinson, Stephanie Atkinson                           ACCOUNT NO.:  [id number]   MEDICAL RECORD NO.:  [id number]                   PATIENT TYPE:  INP   LOCATION:  5030                                 FACILITY:  [facility]   PHYSICIAN:  Mark C. Ophelia Charter, M.D.                 DATE OF BIRTH:  [birth year]   DATE OF ADMISSION:  01/31/2003  DATE OF DISCHARGE:  02/03/2003                                 DISCHARGE SUMMARY   FINAL DIAGNOSES:  1. Status post right total hip arthroplasty for osteoarthritis on January 31, 2003.  2. Hypertension, not otherwise specified.  3. Longterm use of anticoagulants.   HISTORY OF PRESENT ILLNESS:  This 77 year old female with history of right  hip osteoarthritis and chronic pain presented to our office for preoperative  evaluation on January 27, 2003.  She had progressively worsening pain with  failure to respond to conservative treatment.  Significant decrease in her  daily activities due to the ongoing complaints.  Preoperative x-ray showed  bone-on-bone changes with marginal osteophyte formation and subchondral  sclerosis.   LABORATORY DATA:  WBC 6.4, hemoglobin 14.4, hematocrit 43.4, platelets 258.  PT 12.7, INR 0.9, PTT 29.  Sodium 140, potassium 3.9, chloride 106, CO2 27,  glucose 83, BUN 13, creatinine 1.0, calcium 9.5.  TP 7.0, albumin 4.1.  AST  20, ALT 20, alkaline phosphatase 109, total bilirubin 0.6.  UA negative.   HOSPITAL COURSE:  On January 31, 2003, the patient was taken to the [facility] operating room and a right total hip arthroplasty procedure  was performed.  Surgeon was [employer]. Ophelia Charter, M.D. and assistant was Genene Churn.  Denton Meek.  Anesthesia was general with local Marcaine 0.5% used  postoperatively.  Estimated blood loss 300 mL.  There were no surgical or  anesthesia complications and the patient was transferred to recovery room in  stable condition.   On February 01, 2003, the patient complained of right hip pain.  PT 14.7, INR  1.2, hemoglobin 11.1, glucose 135.  Temperature 100.1.  PT consult ordered.  Started pharmacy protocol Coumadin.  Encouraged incentive spirometer.  On  February 02, 2003, good pain control.  Complained of being dizzy when she  stood up.  Temperature 98.7, blood pressure 145/55, pulse 74, respirations  20.  PT 16.5, INR 1.5, hemoglobin 10.5, glucose 126, BUN 5.  Staples intact.  Wound looks good.  Rehab consult ordered.  FESO4 325 mg p.o. b.i.d. with  meals started.  Discontinue morphine PCA and Foley.  IV Hep-lock.  On  February 03, 2003, hemoglobin 10.3, INR 1.6.  Hep-lock discontinued.  Rehab  bed became available and the patient was ready for transfer.   DISCHARGE MEDICATIONS:  Continue all current medications.   CONDITION ON DISCHARGE:  Good and stable.   DISPOSITION:  Transfer to inpatient rehab.   DISCHARGE INSTRUCTIONS:  The patient will  continue to work with physical  therapy to improve ambulation and strengthening.  Staples to be removed two  weeks postoperative.  Remain on Coumadin for DVT prophylaxis x3 to 4 weeks  postoperative.  Dressing changes p.r.n.  She will follow up in our office in  two weeks after discharge from hospital for recheck.  If there are any  problems or complications, Dr. Ophelia Charter will be notified in our office.      Genene Churn. Denton Meek.                      Mark C. Ophelia Charter, M.D.    JMO/MEDQ  D:  03/01/2003  T:  03/01/2003  Job:  [id number]

## 2010-08-26 DIAGNOSIS — Z955 Presence of coronary angioplasty implant and graft: Secondary | ICD-10-CM

## 2010-08-26 DIAGNOSIS — R55 Syncope and collapse: Secondary | ICD-10-CM

## 2010-08-26 HISTORY — DX: Presence of coronary angioplasty implant and graft: Z95.5

## 2010-08-26 HISTORY — DX: Syncope and collapse: R55

## 2010-09-07 ENCOUNTER — Observation Stay (HOSPITAL_COMMUNITY)
Admission: EM | Admit: 2010-09-07 | Discharge: 2010-09-08 | Disposition: A | Discharge status: Home / Self Care | Payer: Medicare Other | Attending: Cardiology | Admitting: Cardiology

## 2010-09-07 ENCOUNTER — Emergency Department (HOSPITAL_COMMUNITY): Payer: Medicare Other

## 2010-09-07 DIAGNOSIS — I4891 Unspecified atrial fibrillation: Secondary | ICD-10-CM | POA: Insufficient documentation

## 2010-09-07 DIAGNOSIS — R079 Chest pain, unspecified: Secondary | ICD-10-CM | POA: Insufficient documentation

## 2010-09-07 DIAGNOSIS — R61 Generalized hyperhidrosis: Secondary | ICD-10-CM | POA: Insufficient documentation

## 2010-09-07 DIAGNOSIS — K222 Esophageal obstruction: Secondary | ICD-10-CM | POA: Insufficient documentation

## 2010-09-07 DIAGNOSIS — I10 Essential (primary) hypertension: Principal | ICD-10-CM | POA: Insufficient documentation

## 2010-09-07 DIAGNOSIS — E785 Hyperlipidemia, unspecified: Secondary | ICD-10-CM | POA: Insufficient documentation

## 2010-09-07 DIAGNOSIS — I2510 Atherosclerotic heart disease of native coronary artery without angina pectoris: Secondary | ICD-10-CM | POA: Insufficient documentation

## 2010-09-07 DIAGNOSIS — Z79899 Other long term (current) drug therapy: Secondary | ICD-10-CM | POA: Insufficient documentation

## 2010-09-07 DIAGNOSIS — R110 Nausea: Secondary | ICD-10-CM | POA: Insufficient documentation

## 2010-09-07 DIAGNOSIS — E039 Hypothyroidism, unspecified: Secondary | ICD-10-CM | POA: Insufficient documentation

## 2010-09-07 DIAGNOSIS — Z951 Presence of aortocoronary bypass graft: Secondary | ICD-10-CM | POA: Insufficient documentation

## 2010-09-07 LAB — BASIC METABOLIC PANEL
GFR calc Af Amer: 49 mL/min — ABNORMAL LOW (ref >60)
GFR calc non Af Amer: 41 mL/min — ABNORMAL LOW (ref >60)
Sodium: 137 mEq/L (ref 135–145)

## 2010-09-07 LAB — CBC
MCH: 27.9 pg (ref 26.0–34.0)
Platelets: 260 10*3/uL (ref 150–400)
WBC: 11.6 10*3/uL — ABNORMAL HIGH (ref 4.0–10.5)

## 2010-09-07 LAB — DIFFERENTIAL
Basophils Absolute: 0 10*3/uL (ref 0.0–0.1)
Basophils Relative: 0 % (ref 0–1)
Eosinophils Absolute: 0 10*3/uL (ref 0.0–0.7)
Eosinophils Relative: 0 % (ref 0–5)
Lymphocytes Relative: 9 % — ABNORMAL LOW (ref 12–46)
Neutro Abs: 9.6 10*3/uL — ABNORMAL HIGH (ref 1.7–7.7)

## 2010-09-07 LAB — CK TOTAL AND CKMB (NOT AT ARMC)
CK, MB: 3.5 ng/mL (ref 0.3–4.0)
CK, MB: 3.6 ng/mL (ref 0.3–4.0)
Relative Index: INVALID (ref 0.0–2.5)
Total CK: 81 U/L (ref 7–177)

## 2010-09-07 LAB — TROPONIN I: Troponin I: <0.3 ng/mL (ref <0.30)

## 2010-09-08 ENCOUNTER — Emergency Department (HOSPITAL_COMMUNITY): Payer: Medicare Other

## 2010-09-08 ENCOUNTER — Inpatient Hospital Stay (HOSPITAL_COMMUNITY)
Admission: EM | Admit: 2010-09-08 | Discharge: 2010-09-13 | DRG: 247 | Disposition: A | Discharge status: Home / Self Care | Payer: Medicare Other | Attending: Internal Medicine | Admitting: Internal Medicine

## 2010-09-08 DIAGNOSIS — Z7982 Long term (current) use of aspirin: Secondary | ICD-10-CM

## 2010-09-08 DIAGNOSIS — I440 Atrioventricular block, first degree: Secondary | ICD-10-CM | POA: Diagnosis present

## 2010-09-08 DIAGNOSIS — F17200 Nicotine dependence, unspecified, uncomplicated: Secondary | ICD-10-CM | POA: Diagnosis present

## 2010-09-08 DIAGNOSIS — Z7902 Long term (current) use of antithrombotics/antiplatelets: Secondary | ICD-10-CM

## 2010-09-08 DIAGNOSIS — E876 Hypokalemia: Secondary | ICD-10-CM | POA: Diagnosis present

## 2010-09-08 DIAGNOSIS — Z951 Presence of aortocoronary bypass graft: Secondary | ICD-10-CM

## 2010-09-08 DIAGNOSIS — I2582 Chronic total occlusion of coronary artery: Secondary | ICD-10-CM | POA: Diagnosis present

## 2010-09-08 DIAGNOSIS — R55 Syncope and collapse: Principal | ICD-10-CM | POA: Diagnosis present

## 2010-09-08 DIAGNOSIS — E785 Hyperlipidemia, unspecified: Secondary | ICD-10-CM | POA: Diagnosis present

## 2010-09-08 DIAGNOSIS — Z96649 Presence of unspecified artificial hip joint: Secondary | ICD-10-CM

## 2010-09-08 DIAGNOSIS — N289 Disorder of kidney and ureter, unspecified: Secondary | ICD-10-CM | POA: Diagnosis present

## 2010-09-08 DIAGNOSIS — I129 Hypertensive chronic kidney disease with stage 1 through stage 4 chronic kidney disease, or unspecified chronic kidney disease: Secondary | ICD-10-CM | POA: Diagnosis present

## 2010-09-08 DIAGNOSIS — I2510 Atherosclerotic heart disease of native coronary artery without angina pectoris: Secondary | ICD-10-CM | POA: Diagnosis present

## 2010-09-08 DIAGNOSIS — E039 Hypothyroidism, unspecified: Secondary | ICD-10-CM | POA: Diagnosis present

## 2010-09-08 DIAGNOSIS — I4891 Unspecified atrial fibrillation: Secondary | ICD-10-CM | POA: Diagnosis present

## 2010-09-08 DIAGNOSIS — E871 Hypo-osmolality and hyponatremia: Secondary | ICD-10-CM | POA: Diagnosis not present

## 2010-09-08 DIAGNOSIS — I498 Other specified cardiac arrhythmias: Secondary | ICD-10-CM | POA: Diagnosis present

## 2010-09-08 DIAGNOSIS — Z882 Allergy status to sulfonamides status: Secondary | ICD-10-CM

## 2010-09-08 DIAGNOSIS — Z79899 Other long term (current) drug therapy: Secondary | ICD-10-CM

## 2010-09-08 DIAGNOSIS — N189 Chronic kidney disease, unspecified: Secondary | ICD-10-CM | POA: Diagnosis present

## 2010-09-08 DIAGNOSIS — Z880 Allergy status to penicillin: Secondary | ICD-10-CM

## 2010-09-08 LAB — CBC
MCH: 28.1 pg (ref 26.0–34.0)
MCHC: 33.9 g/dL (ref 30.0–36.0)
MCV: 83.1 fL (ref 78.0–100.0)
Platelets: 256 10*3/uL (ref 150–400)
RBC: 5.08 MIL/uL (ref 3.87–5.11)

## 2010-09-08 LAB — TSH: TSH: 2.852 u[IU]/mL (ref 0.350–4.500)

## 2010-09-08 LAB — COMPREHENSIVE METABOLIC PANEL
ALT: 53 U/L — ABNORMAL HIGH (ref 0–35)
AST: 33 U/L (ref 0–37)
Albumin: 3.2 g/dL — ABNORMAL LOW (ref 3.5–5.2)
Albumin: 3.4 g/dL — ABNORMAL LOW (ref 3.5–5.2)
Alkaline Phosphatase: 111 U/L (ref 39–117)
Calcium: 10.3 mg/dL (ref 8.4–10.5)
Chloride: 103 mEq/L (ref 96–112)
Creatinine, Ser: 1.54 mg/dL — ABNORMAL HIGH (ref 0.50–1.10)
GFR calc Af Amer: 48 mL/min — ABNORMAL LOW (ref >60)
GFR calc non Af Amer: 40 mL/min — ABNORMAL LOW (ref >60)
Potassium: 4.2 mEq/L (ref 3.5–5.1)
Sodium: 140 mEq/L (ref 135–145)

## 2010-09-08 LAB — TROPONIN I: Troponin I: <0.3 ng/mL (ref <0.30)

## 2010-09-08 LAB — DIFFERENTIAL
Basophils Relative: 0 % (ref 0–1)
Eosinophils Absolute: 0 10*3/uL (ref 0.0–0.7)
Eosinophils Relative: 0 % (ref 0–5)
Lymphs Abs: 1.5 10*3/uL (ref 0.7–4.0)
Monocytes Absolute: 0.9 10*3/uL (ref 0.1–1.0)
Monocytes Relative: 10 % (ref 3–12)
Neutrophils Relative %: 73 % (ref 43–77)

## 2010-09-08 LAB — CK TOTAL AND CKMB (NOT AT ARMC)
Relative Index: INVALID (ref 0.0–2.5)
Total CK: 66 U/L (ref 7–177)

## 2010-09-08 LAB — URINE MICROSCOPIC-ADD ON

## 2010-09-08 LAB — URINALYSIS, ROUTINE W REFLEX MICROSCOPIC
Bilirubin Urine: NEGATIVE
Glucose, UA: NEGATIVE mg/dL
Ketones, ur: NEGATIVE mg/dL
Nitrite: NEGATIVE
Protein, ur: NEGATIVE mg/dL
pH: 7 (ref 5.0–8.0)

## 2010-09-09 LAB — CARDIAC PANEL(CRET KIN+CKTOT+MB+TROPI)
CK, MB: 3.4 ng/mL (ref 0.3–4.0)
CK, MB: 5.4 ng/mL — ABNORMAL HIGH (ref 0.3–4.0)
Relative Index: INVALID (ref 0.0–2.5)
Relative Index: INVALID (ref 0.0–2.5)
Total CK: 56 U/L (ref 7–177)
Total CK: 92 U/L (ref 7–177)
Troponin I: <0.3 ng/mL (ref <0.30)
Troponin I: 0.41 ng/mL (ref <0.30)

## 2010-09-09 LAB — CBC
Platelets: 250 10*3/uL (ref 150–400)
RDW: 15.5 % (ref 11.5–15.5)
WBC: 8.4 10*3/uL (ref 4.0–10.5)

## 2010-09-09 LAB — BASIC METABOLIC PANEL
Chloride: 103 mEq/L (ref 96–112)
Creatinine, Ser: 1.42 mg/dL — ABNORMAL HIGH (ref 0.50–1.10)
GFR calc Af Amer: 43 mL/min — ABNORMAL LOW (ref >60)
Potassium: 4.6 mEq/L (ref 3.5–5.1)
Sodium: 138 mEq/L (ref 135–145)

## 2010-09-10 LAB — POCT ACTIVATED CLOTTING TIME: Activated Clotting Time: 358 seconds

## 2010-09-10 LAB — CBC
HCT: 39 % (ref 36.0–46.0)
MCHC: 33.8 g/dL (ref 30.0–36.0)
Platelets: 247 10*3/uL (ref 150–400)
RDW: 15.1 % (ref 11.5–15.5)

## 2010-09-10 LAB — COMPREHENSIVE METABOLIC PANEL
Albumin: 3.2 g/dL — ABNORMAL LOW (ref 3.5–5.2)
Alkaline Phosphatase: 101 U/L (ref 39–117)
BUN: 21 mg/dL (ref 6–23)
Potassium: 3.7 mEq/L (ref 3.5–5.1)
Total Protein: 6.1 g/dL (ref 6.0–8.3)

## 2010-09-12 LAB — BASIC METABOLIC PANEL
BUN: 33 mg/dL — ABNORMAL HIGH (ref 6–23)
Chloride: 94 mEq/L — ABNORMAL LOW (ref 96–112)
Glucose, Bld: 97 mg/dL (ref 70–99)
Potassium: 3.5 mEq/L (ref 3.5–5.1)

## 2010-09-12 LAB — CBC
HCT: 39.5 % (ref 36.0–46.0)
Hemoglobin: 13.3 g/dL (ref 12.0–15.0)
WBC: 7.9 10*3/uL (ref 4.0–10.5)

## 2010-09-13 LAB — BASIC METABOLIC PANEL
BUN: 38 mg/dL — ABNORMAL HIGH (ref 6–23)
CO2: 28 mEq/L (ref 19–32)
Chloride: 93 mEq/L — ABNORMAL LOW (ref 96–112)
Creatinine, Ser: 1.95 mg/dL — ABNORMAL HIGH (ref 0.50–1.10)
GFR calc Af Amer: 30 mL/min — ABNORMAL LOW (ref >60)

## 2010-09-13 NOTE — Cardiovascular Report (Signed)
Stephanie Atkinson, Stephanie Atkinson                 ACCOUNT NO.:  [id number]  MEDICAL RECORD NO.:  [id number]  LOCATION:  2921                         FACILITY:  [facility]  PHYSICIAN:  Arturo Morton. Riley Kill, MD, FACCDATE OF BIRTH:  08-Mar-1924  DATE OF PROCEDURE:  09/08/2010 DATE OF DISCHARGE:                           CARDIAC CATHETERIZATION   INDICATIONS:  [name] is an 77 year old who was admitted with nausea. She ruled out and she was discharged today with plans for an outpatient stress test.  Barely before getting home, she developed recurrent problems with nausea subsequent syncope.  She was out for a period of time and called as a code stroke on arrival to the emergency room, however, she woke up, and was seen by Dr. Rennis Golden.  He was concerned about her EKG and also that this represented a high-grade unstable angina.  He asked that we perform urgent cardiac catheterization.  The patient's creatinine clearance was felt to be in the low 30s.  I discussed this with the patient and her family.  The plan was to proceed judiciously without left ventriculography and with limited contrast use with a diagnostic catheterization.  She was not having pain at the time of arrival in the catheterization laboratory.  PROCEDURE: 1. Left heart catheterization. 2. Coronary arteriography. 3. Selective free radial graft angiography. 4. Selective left internal mammary angiography. 5. Percutaneous stenting of the distal right coronary artery using     drug-eluting platform. 6. Percutaneous angioplasty of the continuation branch of the right     coronary artery.  DESCRIPTION OF PROCEDURE:  The patient was brought to the catheterization laboratory after informed consent and prepped and draped in the usual fashion.  Through an anterior puncture, the femoral artery was entered.  We counted contrast as we took injections to limit the amount of contrast load.  Two views of the left coronary artery were obtained followed  by two views of the right coronary artery before and after intracoronary nitroglycerin administration.  Following this, the grafts were injected sequentially.  Left ventricular end-diastolic pressure was measured, but we did not perform ventriculography in order to limit the amount of contrast load.  I discussed the case in detail with Dr. Rennis Golden.  She has a high-grade critical right stenosis, as well as a continuation branch stenosis that is about 80%.  After a thorough discussion, I recommend proceeding on to percutaneous intervention. This was explained to the patient and Dr. Rennis Golden went out and spoke with her family.  Bivalirudin was given according to protocol.  600 mg of oral clopidogrel was administered.  We used a JR-4 guiding catheter with side holes.  A traverse wire was passed into the distal vessel, then predilatation was done with a 2 mm compliant balloon first in the posterolateral segment, then in the more proximal segment.  Following this, we proceeded to stent the distal right coronary lesion just slightly across the posterior descending takeoff, and we chose a stent that was appropriately sized to 2.25 mm.  This was taken to about 13-14 atmospheres and there was still some evidence of mal expansion.  A 2.5 noncompliant balloon was then used to fully expand the stent, and there was  marked improvement as noted on fluoroscopy.  There was still a suboptimal result in the continuation branch, we elected to use a 2-mm noncompliant balloon and this was subsequently taken up to about 10 or 11 atmospheres with a marked improvement in the appearance of this vessel.  There were no major complications.  I subsequently chose to sew the sheath in place.  I then spoke with the family about the findings.  HEMODYNAMIC DATA: 1. Central aortic pressure 156/66, mean 99. 2. LV pressure 162/10. 3. No gradient or pullback across the aortic valve.  ANGIOGRAPHIC DATA: 1. The left main is free  of critical disease. 2. The left anterior descending artery has about 60% ostial narrowing.     It leads into a diagonal branch which has a small subbranch which     has a 90% ostial stenosis.  The distal LAD is occluded. 3. The distal left anterior descending artery is a small-caliber     vessel with mild luminal irregularity and fills by a fairly small-     caliber internal mammary which is widely patent. 4. The circumflex has a first and second marginals both of which have     modest disease.  There appears to be at most about 60-70% narrowing     in the first marginal and 70 in the second.  There is a retrograde     filling of the graft which is a free radial graft.  Antegrade     injection of the graft reveals about a 50% area of stenosis prior     to the initial first touchdown into the OM1, and this graft does go     into the OM-2, so there is a dual supply to the circumflex     territory. 5. The right coronary artery is a large-caliber vessel.  There is a 90-     95% stenosis just prior to the takeoff of the large PDA.  In the     continuation branch, there is a focal 80% stenosis prior to a     bifurcation into a posterolateral and a second posterolateral     branch.  The free radial graft to the distal right is basically     atretic and severely diseased distally.  Following stenting, the     distal lesion prior to the PDA is reduced from 90% down to 0%.  The     continuation branch responded nicely to a noncompliant balloon, and     demonstrates probably about 20% residual narrowing.  CONCLUSIONS: 1. Successful percutaneous stenting of the distal right coronary     artery leading into the posterior descending branch with a drug-     eluting platform. 2. Successful percutaneous angioplasty balloon only of the     continuation branch of the right coronary artery past the first     posterolateral branch. 3. Continued patency of the internal mammary to the LAD. 4. Continued  patency of a free radial graft to the OM-1 and 2 with     about 50% stenosis, but competitive filling.  DISPOSITION:  The patient will be treated medically.  Dr. Rennis Golden is providing her care for the weekend.  She will be treated with dual antiplatelet therapy, preferably for minimum of a year.     Arturo Morton. Riley Kill, MD, [facility]     TDS/MEDQ  D:  09/08/2010  T:  09/09/2010  Job:  [id number]  cc:   Italy Hilty, MD Nanetta Batty, M.D. CV Laboratory  Lorelle Formosa, M.D.  Electronically Signed by Shawnie Pons MD [facility] on 09/13/2010 07:29:35 AM

## 2010-09-20 NOTE — Discharge Summary (Signed)
NAME:  Stephanie Atkinson, Stephanie Atkinson                 ACCOUNT NO.:  [id number]  MEDICAL RECORD NO.:  [id number]  LOCATION:  3714                         FACILITY:  [facility]  PHYSICIAN:  [place] Hilty, MD         DATE OF BIRTH:  09-Jan-1925  DATE OF ADMISSION:  October 05, 2010 DATE OF DISCHARGE:  09/08/2010                              DISCHARGE SUMMARY   DISCHARGE DIAGNOSES: 1. Hypertensive urgency, blood pressure improved.  Metoprolol     increased to 50 mg daily. 2. Hypothyroidism. 3. Coronary artery disease, status post coronary artery bypass     grafting in March 2006. 4. History of atrial fibrillation. 5. Hyperlipidemia. 6. History of esophageal stricture.  HOSPITAL COURSE:  Stephanie Atkinson is an 77 year old African American female with history of hypertension, atrial fibrillation, coronary artery disease with coronary artery bypass grafting in 2006, hypothyroidism, hyperlipidemia, and esophageal stricture.  She was walking and shopping at a mall, she developed feeling of being very tired and developed diaphoresis and nausea.  Upon presentation at the ER, her blood pressure was 204/77 with recheck showing 138/54.  She was admitted for observation overnight.  Cardiac enzymes were cycled and negative x2. EKG shows T-wave abnormality in V5 and V6 that was similar to previous, first-degree AV block.  Blood pressures improved this morning.  She has had no complaints of nausea, chest pain, or shortness of breath.  She has been seen by Dr. Rennis Golden who felt she was stable for discharge home with followup to Dr. Allyson Sabal.  DISCHARGE LABS:  WBC is 11.6, hemoglobin 12.9, hematocrit 39.0, platelets 260.  Sodium 140, potassium 4.2, chloride 103, carbon dioxide 29, glucose 86, BUN 20, creatinine 1.28, total bilirubin 0.4, alkaline phosphatase 111, AST 35, ALT 49, total protein 6.0, albumin 3.2, calcium 10.0.  Cardiac enzymes negative x2.  Thyroid stimulating hormone is 2.852.  STUDIES/PROCEDURES:  Chest x-ray 10-05-22 2012, shows questionable mild course basilar pulmonary markings.  No acute findings.  DISCHARGE MEDICATIONS: 1. Metoprolol XL succinate 50 mg 1 tablet by mouth daily. 2. Amiodarone 200 mg half tablet by mouth daily. 3. Aspirin 81 mg 1 tablet by mouth daily. 4. Cyclobenzaprine 10 mg 1 tablet by mouth daily at bedtime as needed     for back spasm. 5. Diovan HCT 320/12.5 mg 1 tablet by mouth daily. 6. Diphenhydramine 25 mg 1 tablet by mouth daily as needed for sinus     allergies. 7. Furosemide 40 mg 1 tablet by mouth twice daily. 8. Ibuprofen 200 mg 1-2 tablets by mouth every 8 hours as needed for     pain or headache. 9. Levothyroxine 25 mcg 1 tablet by mouth daily. 10.Lipitor 40 mg 1 tablet by mouth daily. 11.Tramadol HCl/acetaminophen 37.5/325 mg 1-2 tablets by mouth every 6     hours as needed for pain.  DISPOSITION:  Stephanie Atkinson was discharged home in stable condition, recommended to eat heart-healthy diet.  She will follow up with Dr. Allyson Sabal in approximately 1-2 weeks and office will call with the appointment time.    ______________________________ Wilburt Finlay, PA   ______________________________ [place] Hilty, MD    BH/MEDQ  D:  09/08/2010  T:  09/08/2010  Job:  [id number]  cc:   Dr. Allyson Sabal  Electronically Signed by Wilburt Finlay PA on 09/12/2010 11:56:05 AM Electronically Signed by Kirtland Bouchard. HILTY M.D. on 09/20/2010 04:54:09 PM

## 2010-09-20 NOTE — Discharge Summary (Signed)
Stephanie Atkinson, Stephanie Atkinson                 ACCOUNT NO.:  [id number]  MEDICAL RECORD NO.:  [id number]  LOCATION:  2027                         FACILITY:  [facility]  PHYSICIAN:  [place] Chandi Nicklin, MD         DATE OF BIRTH:  14-Feb-1925  DATE OF ADMISSION:  09/08/2010 DATE OF DISCHARGE:  09/13/2010                              DISCHARGE SUMMARY   DISCHARGE DIAGNOSES: 1. Syncope secondary to possible ischemia versus arrhythmia. 2. Abnormal EKG on admission. 3. Coronary artery disease with stent placement to the distal right     coronary artery and balloon angioplasty to the posterolateral     artery by Dr. Riley Kill, who was MD on-call for urgent cardiac catheterization.     a.     Status post bypass grafting in 2006 with atretic vein graft      to the right coronary artery on this catheterization and patent      left internal mammary artery, patent small radial to obtuse      marginal 1 and 2, and normal left ventricular function.     b.     Mild increase of troponin after procedure. 4. Paroxysmal atrial fibrillation. 5. Sinus bradycardia with heart rates in the 40s with decrease in her     beta-blocker. 6. Acute on chronic renal insufficiency with chronic kidney disease.     At discharge, creatinine 1.95. 7. Hypokalemia, resolved. 8. Hypertension, treated. 9. Dyslipidemia. 10.Tobacco use with snuff with consults to discontinue.  DISCHARGE CONDITION:  Improved.  DISCHARGE MEDICATIONS:  See medication reconciliation sheet.  We had decreased her Lasix due to her renal issues.  We stopped her hydrochlorothiazide and we decreased her beta-blocker, Plavix, and increase of aspirin were also noted.  DISCHARGE INSTRUCTIONS: 1. Increase activity slowly.  May shower. 2. Diet:  Heart-healthy diet. 3. Wash cath site with soap and water.  Call us if any bleeding,     swelling or drainage. 4. Follow up with Dr. Allyson Sabal on October 03, 2010 at 1:30 p.m. 5. Appointment for heart monitor actually will be mailed to  her and     probably arrive over the weekend.  She is scheduled for 2-D echo on     September 27, 2010 at 2 p.m.  HOSPITAL COURSE:  An 77 year old African American female with history of hypertension, PAF, coronary artery disease with bypass grafting in 2006, presented with hypertensive urgency on September 07, 2010.  Her blood pressure in the ER was 204/77.  She had improvement and blood pressure was 148/82 and she was feeling better and discharged home.  In route home, she had a syncopal episode in the car.  She became hot, diaphoretic and her vision became dark and then she became unresponsive. Her daughter called 911 and she was brought immediately back.  She had abnormal EKG on arrival with ST depression in II, III, and aVF, and ST mildly elevated in V1 through V2.  She was seen by Dr. Rennis Golden and she had a similar episode prior to her bypass grafting.  CT of her head was done, which was negative for any acute process and she underwent cardiac catheterization, which found atretic vein graft  to the RCA and 95% stenosis distal RCA and 80% PLA lesion.  She underwent PTCA with Promus drug-eluting stent to the RCA and angioplasty to the PLA.  Tolerated the procedure well.  Her troponin did bump slightly after the procedure. She continued to be stable except bradycardic in the 40s.  Her beta- blocker was decreased and then her creatinine increased, so we have been given her fluids on the 18th and also initially decreased the Lasix and discontinued the Lasix and hydrochlorothiazide and are holding the Diovan on the day of discharge.  She will have also need blood work done on Monday, which will be a basic metabolic panel to further evaluate her kidney status.  The patient ambulated in the hall without complications. She denies any dizziness or lightheadedness at discharge and is ready for discharge.     Darcella Gasman. Annie Paras, N.P.   ______________________________ [place] Jhonathan Desroches, MD    LRI/MEDQ  D:   09/13/2010  T:  09/13/2010  Job:  [id number]  cc:   Nanetta Batty, M.D.  Electronically Signed by Nada Boozer N.P. on 09/16/2010 06:35:33 PM Electronically Signed by Kirtland Bouchard. Atina Feeley M.D. on 09/20/2010 06:36:26 PM

## 2010-09-27 HISTORY — PX: DOPPLER ECHOCARDIOGRAPHY: SHX263

## 2010-10-18 ENCOUNTER — Emergency Department (HOSPITAL_COMMUNITY)
Admission: EM | Admit: 2010-10-18 | Discharge: 2010-10-19 | Disposition: A | Discharge status: Home / Self Care | Payer: Medicare Other | Attending: Emergency Medicine | Admitting: Emergency Medicine

## 2010-10-18 ENCOUNTER — Emergency Department (HOSPITAL_COMMUNITY): Payer: Medicare Other

## 2010-10-18 DIAGNOSIS — R350 Frequency of micturition: Secondary | ICD-10-CM | POA: Insufficient documentation

## 2010-10-18 DIAGNOSIS — Z951 Presence of aortocoronary bypass graft: Secondary | ICD-10-CM | POA: Insufficient documentation

## 2010-10-18 DIAGNOSIS — K5730 Diverticulosis of large intestine without perforation or abscess without bleeding: Secondary | ICD-10-CM | POA: Insufficient documentation

## 2010-10-18 DIAGNOSIS — N390 Urinary tract infection, site not specified: Secondary | ICD-10-CM | POA: Insufficient documentation

## 2010-10-18 DIAGNOSIS — I252 Old myocardial infarction: Secondary | ICD-10-CM | POA: Insufficient documentation

## 2010-10-18 DIAGNOSIS — I10 Essential (primary) hypertension: Secondary | ICD-10-CM | POA: Insufficient documentation

## 2010-10-18 DIAGNOSIS — R109 Unspecified abdominal pain: Secondary | ICD-10-CM | POA: Insufficient documentation

## 2010-10-18 DIAGNOSIS — I2510 Atherosclerotic heart disease of native coronary artery without angina pectoris: Secondary | ICD-10-CM | POA: Insufficient documentation

## 2010-10-18 DIAGNOSIS — E785 Hyperlipidemia, unspecified: Secondary | ICD-10-CM | POA: Insufficient documentation

## 2010-10-18 HISTORY — DX: Essential (primary) hypertension: I10

## 2010-10-18 HISTORY — DX: Presence of aortocoronary bypass graft: Z95.1

## 2010-10-18 LAB — POCT I-STAT, CHEM 8
BUN: 21 mg/dL (ref 6–23)
Calcium, Ion: 1.25 mmol/L (ref 1.12–1.32)
Chloride: 105 meq/L (ref 96–112)
Creatinine, Ser: 1.6 mg/dL — ABNORMAL HIGH (ref 0.50–1.10)
Glucose, Bld: 105 mg/dL — ABNORMAL HIGH (ref 70–99)
HCT: 41 % (ref 36.0–46.0)
Hemoglobin: 13.9 g/dL (ref 12.0–15.0)
Potassium: 3.7 meq/L (ref 3.5–5.1)
Sodium: 143 meq/L (ref 135–145)
TCO2: 29 mmol/L (ref 0–100)

## 2010-10-18 LAB — URINALYSIS, ROUTINE W REFLEX MICROSCOPIC
Bilirubin Urine: NEGATIVE
Ketones, ur: NEGATIVE mg/dL
Protein, ur: NEGATIVE mg/dL
Urobilinogen, UA: 0.2 mg/dL (ref 0.0–1.0)

## 2010-10-18 LAB — DIFFERENTIAL
Basophils Absolute: 0 10*3/uL (ref 0.0–0.1)
Eosinophils Relative: 1 % (ref 0–5)
Lymphocytes Relative: 19 % (ref 12–46)
Lymphs Abs: 1.4 10*3/uL (ref 0.7–4.0)
Monocytes Absolute: 0.5 10*3/uL (ref 0.1–1.0)

## 2010-10-18 LAB — CBC
HCT: 39.9 % (ref 36.0–46.0)
MCHC: 32.8 g/dL (ref 30.0–36.0)
MCV: 86.7 fL (ref 78.0–100.0)
RDW: 14.7 % (ref 11.5–15.5)

## 2010-10-19 ENCOUNTER — Encounter (HOSPITAL_COMMUNITY): Payer: Self-pay | Admitting: Radiology

## 2010-10-20 LAB — URINE CULTURE: Colony Count: >=100000

## 2010-12-10 ENCOUNTER — Emergency Department (HOSPITAL_COMMUNITY): Payer: Medicare Other

## 2010-12-10 ENCOUNTER — Inpatient Hospital Stay (HOSPITAL_COMMUNITY)
Admission: EM | Admit: 2010-12-10 | Discharge: 2010-12-12 | DRG: 287 | Disposition: A | Discharge status: Home / Self Care | Payer: Medicare Other | Attending: Cardiovascular Disease | Admitting: Cardiovascular Disease

## 2010-12-10 DIAGNOSIS — K219 Gastro-esophageal reflux disease without esophagitis: Secondary | ICD-10-CM | POA: Diagnosis present

## 2010-12-10 DIAGNOSIS — Z96649 Presence of unspecified artificial hip joint: Secondary | ICD-10-CM

## 2010-12-10 DIAGNOSIS — N189 Chronic kidney disease, unspecified: Secondary | ICD-10-CM | POA: Diagnosis present

## 2010-12-10 DIAGNOSIS — E039 Hypothyroidism, unspecified: Secondary | ICD-10-CM | POA: Diagnosis present

## 2010-12-10 DIAGNOSIS — I129 Hypertensive chronic kidney disease with stage 1 through stage 4 chronic kidney disease, or unspecified chronic kidney disease: Secondary | ICD-10-CM | POA: Diagnosis present

## 2010-12-10 DIAGNOSIS — Z7902 Long term (current) use of antithrombotics/antiplatelets: Secondary | ICD-10-CM

## 2010-12-10 DIAGNOSIS — I25810 Atherosclerosis of coronary artery bypass graft(s) without angina pectoris: Secondary | ICD-10-CM | POA: Diagnosis present

## 2010-12-10 DIAGNOSIS — Z79899 Other long term (current) drug therapy: Secondary | ICD-10-CM

## 2010-12-10 DIAGNOSIS — Z9861 Coronary angioplasty status: Secondary | ICD-10-CM

## 2010-12-10 DIAGNOSIS — Z7982 Long term (current) use of aspirin: Secondary | ICD-10-CM

## 2010-12-10 DIAGNOSIS — E785 Hyperlipidemia, unspecified: Secondary | ICD-10-CM | POA: Diagnosis present

## 2010-12-10 DIAGNOSIS — K222 Esophageal obstruction: Secondary | ICD-10-CM | POA: Diagnosis present

## 2010-12-10 DIAGNOSIS — I2510 Atherosclerotic heart disease of native coronary artery without angina pectoris: Principal | ICD-10-CM | POA: Diagnosis present

## 2010-12-10 DIAGNOSIS — I200 Unstable angina: Secondary | ICD-10-CM | POA: Diagnosis present

## 2010-12-10 DIAGNOSIS — Z882 Allergy status to sulfonamides status: Secondary | ICD-10-CM

## 2010-12-10 DIAGNOSIS — Z880 Allergy status to penicillin: Secondary | ICD-10-CM

## 2010-12-10 DIAGNOSIS — I4891 Unspecified atrial fibrillation: Secondary | ICD-10-CM | POA: Diagnosis present

## 2010-12-10 DIAGNOSIS — I2582 Chronic total occlusion of coronary artery: Secondary | ICD-10-CM | POA: Diagnosis present

## 2010-12-10 HISTORY — PX: CARDIAC CATHETERIZATION: SHX172

## 2010-12-10 LAB — CBC
HCT: 44 % (ref 36.0–46.0)
Hemoglobin: 14.6 g/dL (ref 12.0–15.0)
MCH: 28.3 pg (ref 26.0–34.0)
MCHC: 33.2 g/dL (ref 30.0–36.0)
MCV: 85.4 fL (ref 78.0–100.0)
Platelets: 183 10*3/uL (ref 150–400)
RBC: 5.15 MIL/uL — ABNORMAL HIGH (ref 3.87–5.11)
RDW: 13.3 % (ref 11.5–15.5)
WBC: 5.3 K/uL (ref 4.0–10.5)

## 2010-12-10 LAB — PROTIME-INR
INR: 0.93 (ref 0.00–1.49)
Prothrombin Time: 12.7 s (ref 11.6–15.2)

## 2010-12-10 LAB — COMPREHENSIVE METABOLIC PANEL
ALT: 24 U/L (ref 0–35)
Albumin: 4.3 g/dL (ref 3.5–5.2)
Alkaline Phosphatase: 108 U/L (ref 39–117)
BUN: 16 mg/dL (ref 6–23)
Chloride: 102 mEq/L (ref 96–112)
Potassium: 4.6 mEq/L (ref 3.5–5.1)
Sodium: 140 mEq/L (ref 135–145)
Total Bilirubin: 0.5 mg/dL (ref 0.3–1.2)

## 2010-12-10 LAB — PRO B NATRIURETIC PEPTIDE: Pro B Natriuretic peptide (BNP): 312.5 pg/mL (ref 0–450)

## 2010-12-10 LAB — COMPREHENSIVE METABOLIC PANEL WITH GFR
AST: 41 U/L — ABNORMAL HIGH (ref 0–37)
CO2: 28 meq/L (ref 19–32)
Calcium: 10.5 mg/dL (ref 8.4–10.5)
Creatinine, Ser: 1.22 mg/dL — ABNORMAL HIGH (ref 0.50–1.10)
GFR calc Af Amer: 46 mL/min — ABNORMAL LOW (ref >90)
GFR calc non Af Amer: 40 mL/min — ABNORMAL LOW (ref >90)
Glucose, Bld: 87 mg/dL (ref 70–99)
Total Protein: 7.6 g/dL (ref 6.0–8.3)

## 2010-12-10 LAB — APTT: aPTT: 22 s — ABNORMAL LOW (ref 24–37)

## 2010-12-10 LAB — POCT I-STAT TROPONIN I: Troponin i, poc: 0 ng/mL (ref 0.00–0.08)

## 2010-12-11 LAB — BASIC METABOLIC PANEL
CO2: 25 mEq/L (ref 19–32)
Chloride: 108 mEq/L (ref 96–112)
Glucose, Bld: 100 mg/dL — ABNORMAL HIGH (ref 70–99)
Potassium: 3.3 mEq/L — ABNORMAL LOW (ref 3.5–5.1)
Sodium: 143 mEq/L (ref 135–145)

## 2010-12-11 LAB — CBC
Hemoglobin: 13.3 g/dL (ref 12.0–15.0)
MCH: 27.9 pg (ref 26.0–34.0)
RBC: 4.77 MIL/uL (ref 3.87–5.11)
WBC: 5.6 10*3/uL (ref 4.0–10.5)

## 2010-12-11 LAB — LIPID PANEL
Cholesterol: 147 mg/dL (ref 0–200)
HDL: 56 mg/dL (ref >39)
LDL Cholesterol: 74 mg/dL (ref 0–99)
Total CHOL/HDL Ratio: 2.6 ratio
Triglycerides: 84 mg/dL (ref <150)
VLDL: 17 mg/dL (ref 0–40)

## 2010-12-11 LAB — CARDIAC PANEL(CRET KIN+CKTOT+MB+TROPI)
CK, MB: 2.8 ng/mL (ref 0.3–4.0)
CK, MB: 4 ng/mL (ref 0.3–4.0)
Relative Index: INVALID (ref 0.0–2.5)
Relative Index: 2.6 — ABNORMAL HIGH (ref 0.0–2.5)
Total CK: 75 U/L (ref 7–177)
Total CK: 152 U/L (ref 7–177)
Troponin I: <0.3 ng/mL (ref <0.30)
Troponin I: <0.3 ng/mL (ref <0.30)

## 2010-12-11 LAB — BASIC METABOLIC PANEL WITH GFR
BUN: 13 mg/dL (ref 6–23)
Calcium: 10.1 mg/dL (ref 8.4–10.5)
Creatinine, Ser: 1.18 mg/dL — ABNORMAL HIGH (ref 0.50–1.10)
GFR calc Af Amer: 48 mL/min — ABNORMAL LOW (ref >90)
GFR calc non Af Amer: 41 mL/min — ABNORMAL LOW (ref >90)

## 2010-12-11 LAB — CK TOTAL AND CKMB (NOT AT ARMC)
CK, MB: 2.4 ng/mL (ref 0.3–4.0)
Relative Index: INVALID (ref 0.0–2.5)
Total CK: 67 U/L (ref 7–177)

## 2010-12-11 LAB — MAGNESIUM: Magnesium: 2.2 mg/dL (ref 1.5–2.5)

## 2010-12-11 LAB — TSH: TSH: 1.728 u[IU]/mL (ref 0.350–4.500)

## 2010-12-11 LAB — POCT ACTIVATED CLOTTING TIME: Activated Clotting Time: 144 seconds

## 2010-12-11 LAB — TROPONIN I: Troponin I: <0.3 ng/mL (ref <0.30)

## 2010-12-11 LAB — HEPARIN LEVEL (UNFRACTIONATED): Heparin Unfractionated: 0.29 IU/mL — ABNORMAL LOW (ref 0.30–0.70)

## 2010-12-12 LAB — BASIC METABOLIC PANEL
BUN: 13 mg/dL (ref 6–23)
Calcium: 10.3 mg/dL (ref 8.4–10.5)
Creatinine, Ser: 1.32 mg/dL — ABNORMAL HIGH (ref 0.50–1.10)
GFR calc Af Amer: 42 mL/min — ABNORMAL LOW (ref >90)

## 2010-12-14 NOTE — Cardiovascular Report (Signed)
NAME:  Stephanie Atkinson, Stephanie Atkinson NO.:  [id number]  MEDICAL RECORD NO.:  [id number]  LOCATION:  3703                         FACILITY:  [facility]  PHYSICIAN:  Landry Corporal, MD DATE OF BIRTH:  September 23, 1924  DATE OF PROCEDURE:  12/11/2010 DATE OF DISCHARGE:  12/12/2010                           CARDIAC CATHETERIZATION   PRIMARY CARDIOLOGIST:  Nanetta Batty, MD.  PERFORMING PHYSICIAN:  Landry Corporal, MD.  PROCEDURES PERFORMED: 1. Left heart catheterization without left ventriculography from the     right common femoral artery access using a 5-French sheath. 2. Native coronary angiography. 3. Radial artery graft angiography. 4. LIMA to LAD angiography.  INDICATIONS:  Diaphoresis and chest pressure with known CAD.  [name] is a very pleasant 77 year old woman, who recently back in July of this year underwent PCI of the distal RCA and PTCA of the right posterolateral branch after presenting with basically syncope after having some exertional dyspnea and angina.  She was readmitted earlier this morning having a spell of diaphoresis with some chest discomfort yesterday prior to arrival.  Her baseline coronary history is that she had a coronary artery bypass grafting in March 2006, with a LIMA to LAD, vein graft to the RCA, and vein graft sequential to OM-1, OM-2.  The free RIMA graft to the distal right was atretic and it is really diseased and basically occluded and the circumflex was a free radial. Based on her symptoms being very similar to her initial presenting symptoms for last hospitalization, I had a long discussion with the patient and family about the risks, benefits, alternatives, and indications of stress testing versus cardiac catheterization.  It was felt like the cardiac catheterization would be a more definitive study based on her existing disease and her recent PCI and, therefore, she was referred for a diagnostic cardiac catheterization.  The  risks, benefits, alternatives, and indications of procedure were explained to the patient in full detail.  An informed consent was obtained with signed form placed on the chart.  Risks included, but not limited to death, stroke, MI, VF/VT arrest, adverse medical reaction, vascular injury requiring vascular surgery, requiring redo bypass grafting emergently, renal insufficiency, adverse medication reaction, bleeding, hematoma.  The patient voiced understanding of these and did agree to proceed.  PROCEDURE:  The patient was brought to Second [facility], prepped and draped in usual sterile fashion for common femoral artery access.  After time-out period was performed, the patient was sedated with intravenous Versed and fentanyl.  The right femoral head was localized using tactile fluoroscopic guidance and the right groin was anesthetized using 1% subcutaneous lidocaine.  The right hand femoral artery was then accessed using the modified Seldinger technique with the placement of a 5-French sheath.  Sheath was aspirated and flushed and then first a 5-French JL-4 followed by 5-French JR4 catheters were advanced over a wire and multiple angiographic views of first left and right coronary systems were obtained.  The JR4 catheter was then used to engage the free radial graft to the sequential graft to the obtuse marginal branches and also pulled back into the subclavian artery and used to take for angiographic  images of the LIMA to LAD.  The catheter was then pulled back into the aortic arch and advanced over a wire across the aortic valve for measuring pullback of left ventricular pressures and pullback gradient.  A left ventriculogram was not performed as she just had recent evaluation of cardiac function and there was concern for possible contrast nephropathy from her last procedure based on her back chronic kidney disease at least stage II  to III.  CATHETERIZATION LABORATORY STATISTICS: 1. Sedation: 1 mg Versed and 25 mcg fentanyl. 2. Contrast:  100 mL.  HEMODYNAMICS: 1. Central aortic pressure of 193/68 mmHg with a mean of 115 mmHg. 2. Left ventricular pressure 195/8 mmHg with an EDP of 23 mmHg.  ANGIOGRAPHIC FINDINGS: 1. I did not view the known atretic RIMA graft to the right, this was     previously imaged and known to be atretic. 2. The right coronary artery.  The native right coronary artery has a     very proximal S-A nodal branch which has about 80% lesion.  The     distal portion has a widely patent stent that was recently placed     in July and the prior PTCA site remains at about 10%-20% stenosis. 3. The left main is a short vessel that bifurcates into an LAD which     is roughly 80% proximal lesion.  The remainder part of the LAD has     a small diagonal branch with 80% lesion prior to being essentially     occluded.  The circumflex is relatively patent in the proximal     portion.  First proximal obtuse marginal has 80% lesion and then is     occluded shortly thereafter.  Then after the takeoff of the second     obtuse marginal which is also occluded, there is a 60% lesion in a     1.5 mm vessel.  Remainder of the AV groove circumflex gives rise to     another posterolateral branch with no significant disease. 4. The radial graft to the sequential obtuse marginal branches has a     maybe 40% ostial lesion.  The distal obtuse marginals have TIMI-3     flow, but are diffusely disease. 5. The LIMA to LAD is widely patent.  There is a small diagonal branch     just after the anastomosis.  There is minimal retrograde flow in     the LAD itself.  The LAD then wraps around the apex, but is a small-     caliber vessel.  There is small diffuse disease throughout.  IMPRESSION: 1. Known atretic right internal mammary artery to right coronary     artery with patent percutaneous coronary intervention site of the      native right coronary artery as well as percutaneous transluminal     angioplasty site of the RPL. 2. Patent radial graft to the first obtuse marginal and second obtuse     marginal as well as patent left internal mammary artery to left     anterior descending artery. 3. Severe hypertension with mildly elevated EDP. 4. No evidence of obstructive coronary artery lesions remaining.  PLAN:  Aggressive blood pressure control.  Hydrate overnight for possible contrast nephropathy and likely discharge in the morning.          ______________________________ Landry Corporal, MD     DWH/MEDQ  D:  12/12/2010  T:  12/13/2010  Job:  [id number]  cc:   Tresa Moore  [facility] Nanetta Batty, M.D. Cardiac Catheterization Laboratory  Electronically Signed by Bryan Lemma MD on 12/14/2010 03:14:27 PM

## 2010-12-28 NOTE — H&P (Signed)
NAME:  Stephanie Atkinson, Stephanie Atkinson NO.:  [id number]  MEDICAL RECORD NO.:  [id number]  LOCATION:  [facility]                         FACILITY:  [facility]  PHYSICIAN:  Nanetta Batty, M.D.   DATE OF BIRTH:  [birth year]  DATE OF ADMISSION:  12/10/2010 DATE OF DISCHARGE:                             HISTORY & PHYSICAL   CHIEF COMPLAINT:  Chest pain and diaphoresis.  HISTORY OF PRESENT ILLNESS:  [name] is a very pleasant 77 year old African American female with history of coronary atherosclerotic heart disease.  She is status post coronary artery bypass graft in March 2006 and underwent percutaneous coronary intervention and drug-eluting stent placement to the distal right coronary artery as well as PTCA of the PL branch of the right coronary artery in July 2012.  She also has a history of hypertension, dyslipidemia, and hypothyroidism.  She also has chronic renal insufficiency.  She presents to the emergency department this evening after experiencing several episodes of chest discomfort earlier yesterday morning.  These episodes occurred while she was at rest.  She describes an aching discomfort under her left breast without radiation of the pain.  No shortness of breath associated with this.  No nausea or diaphoresis.  She states that these episodes lasted approximately 5 minutes and abated spontaneously.  Other than these intermittent episodes, she felt well during the day, however, around 2 p.m. she was at rest and developed sudden onset of diaphoresis.  She felt extremely hot from her hairline into her chest.  She was sweating profusely and complained of some weakness and tingling in her legs at that time as well.  She denies any syncope or near syncope.  No tachycardia or palpitations and no notable chest discomfort at that time.  She unfortunately experienced similar symptoms in the summer, prior to the placement of her stent in the RCA or she became very diaphoretic and  actually had a syncopal episode, which was thought to be her antianginal equivalent.  Because of that the symptoms that she had previously experienced she used 2 of her sublingual nitroglycerin, and the diaphoresis and weakness improved. She had no other complaints throughout the day.  Her youngest daughter stopped by to visit her this evening.  She was told of the events that occurred throughout the day, and she insisted that she come to the emergency department for evaluation.  On arrival, her EKG revealed sinus rhythm with T-wave inversion inferior and lateral.  There was slight change worsening of the T-wave changes inferiorly as compared to previous EKGs.  Her initial cardiac enzymes have been negative.  There was no evidence of active pulmonary disease.  On her chest x-ray.  Currently, she has no complaints and feels well without any chest discomfort.  PAST MEDICAL HISTORY: 1. Coronary atherosclerotic heart disease, status post coronary artery     bypass graft in March 2006 with a LIMA to the LAD, a free right     internal mammary artery to the posterior descending and a     sequential left radial artery graft to the 1st and 2nd obtuse     marginal branches of the left circumflex artery. 2. History of hypertension. 3.  Dyslipidemia. 4. Acute on chronic renal insufficiency with creatinine of 1.2. 5. Paroxysmally atrial fibrillation, on amiodarone therapy. 6. Sinus bradycardia, asymptomatic. 7. Hypothyroidism. 8. History of esophageal stricture. 9. Osteoarthritis. 10.History of right hip arthroplasty in December 2004, status post     cholecystectomy.  ALLERGIES:  PENICILLIN and SULFA.  CURRENT MEDICATIONS: 1. Amiodarone 200 mg 1/2 tablet daily. 2. Enteric-coated ASA 325 daily. 3. Plavix 75 mg daily. 4. Diovan 320 mg daily. 5. Lasix 40 mg daily. 6. Levothyroxine 50 mcg daily. 7. Lipitor 40 mg daily. 8. Metoprolol-XL 50 mg 1/2 tablet daily. 9. Sublingual nitroglycerin  p.r.n.  REVIEW OF SYSTEMS:  As per HPI otherwise negative.  FAMILY HISTORY:  Noncontributory.  SOCIAL HISTORY:  She is a widow.  She lives alone.  She denies any tobacco or alcohol use.  PHYSICAL EXAMINATION:  VITAL SIGNS:  Blood pressure is 140/60, pulse is 50 and regular, respirations 18, is a very pleasant, African American female in no acute distress. HEENT:  Pupils are equal, reactive to light and accommodation. Extraocular movements intact. NECK:  Supple.  No JVD, carotid bruits, or thyromegaly. CARDIOVASCULAR:  Regular rate and rhythm.  S1 and S2 without appreciable murmur, gallop, or rub. LUNGS:  Clear to auscultation bilaterally with normal respiratory effort. ABDOMEN:  Soft, nontender, without hepatosplenomegaly or masses. EXTREMITIES:  Radial, femoral, dorsal pedal arteries are present.  No lower extremity edema.  No clubbing, cyanosis, or ulcers. NEUROLOGICAL:  Alert and oriented to person, place, and time.  Normal mood and affect.  LABORATORY DATA:  Chest x-ray reveals no active pulmonary disease.  BNP is 312.  CMP revealed a BUN of 16, creatinine of 1.22.  PTT is 22.  PT is 12.7 with an INR of 0.93.  Troponin is 0.0.  CBC, white count is 5.3 hemoglobin is 14.6, hematocrit is 44.0, platelets of 183.  IMPRESSION: 1. Chest pain, diaphoresis questionable anginal equivalent. 2. Known coronary disease status post DES stent in the right coronary     artery, as well as PTCA of the  posterolateral branch.  Status post     CABG in 2006. 3. Hypertension. 4. Dyslipidemia. 5. Hypothyroidism.  PLAN:  We will admit to telemetry and rule out for myocardial infarction.  We will start her on heparin and continue her current medicines.  We will hold her Lasix and Diovan in the mornings in anticipation of possible cardiac catheterization if needed.  We will also keep her n.p.o. given her recent stent in her similar symptoms especially with improvement with nitroglycerin.  She  may need cardiac catheterization to evaluate for progression of her disease.  However given her chronic renal insufficiency, Myoview may be considered as well.  We will have the physician see her first thing in the morning to determine the need for catheterization versus Myoview. We will check her TSH as well as lipid panel and EKG in the morning.    ______________________________ [employer], NP   ______________________________ Nanetta Batty, M.D.    LS/MEDQ  D:  12/11/2010  T:  12/11/2010  Job:  [id number]  cc:   [facility]  Electronically Signed by Charmian Muff NP on 12/23/2010 09:54:05 PM Electronically Signed by Nanetta Batty M.D. on 12/28/2010 05:26:30 PM

## 2010-12-28 NOTE — Discharge Summary (Signed)
  Stephanie Atkinson, GOTO NO.:  [id number]  MEDICAL RECORD NO.:  [id number]  LOCATION:  3703                         FACILITY:  [facility]  PHYSICIAN:  Nanetta Batty, M.D.   DATE OF BIRTH:  06/26/1924  DATE OF ADMISSION:  12/10/2010 DATE OF DISCHARGE:  12/12/2010                              DISCHARGE SUMMARY   DISCHARGE DIAGNOSES: 1. Unstable angina, myocardial infarction ruled out. 2. Coronary disease with coronary artery bypass grafting in 2006, with     percutaneous coronary intervention July 2012, catheterization this     admission showing no critical coronary disease and plan is for     continued medical therapy. 3. Hypertension, her medications were adjusted this admission and is     under better control at discharge. 4. Treated dyslipidemia. 5. History of paroxysmal atrial fibrillation, the patient has been on     amiodarone and in sinus rhythm, she is on aspirin daily. 6. Gastroesophageal reflux with history of esophageal stricture.  HOSPITAL COURSE:  The patient is an 77 year old female with history of coronary artery bypass grafting x4 in March 2006.  She subsequently had a drug-eluting stent to the posterolateral branch in July 2012.  She was admitted with unstable angina on December 10, 2010.  See history and physical for complete details.  The patient was admitted, put on heparin and nitrates, set up for diagnostic catheterization.  This was done by Dr. Herbie Baltimore on December 11, 2010.  The patient has a known atretic RIMA to RCA with a patent distal RCA PLA stent site.  She has a patent radial artery to the OM1 and OM2, and a patent LIMA to the LAD. Catheterization, she had elevated LV end-diastolic pressure.  She was watched overnight.  She did have some pain at the site of her femoral cannulization.  She was seen by Dr. Clarene Duke, the next day.  A Doppler had been ordered the previous day, but by the next day, she was totally asymptomatic and ambulating  without problem.  On examination of her groin revealed no swelling and no ecchymosis.  Dr. Clarene Duke, felt she could be discharged without having her duplex study.  She will follow up with Dr. Allyson Sabal.  She will be contacted by the office.  Please see med rec for complete discharge medications.  LABS:  White count 5.6, hemoglobin 12.2, hematocrit 40.4, platelet count 221.  INR 0.9.  Sodium 142, potassium 3.8, BUN 13, creatinine 1.3.  CK- MB and troponins are negative.  Liver function show an AST of 41, ALT of 24, cholesterol is 147, triglycerides 84, LDL 74, TSH is 1.72.  Chest x-ray shows no acute process.  EKG shows sinus rhythm, nonspecific ST changes with LVH.  DISPOSITION:  Patient discharged in stable condition and will follow up with Dr. Allyson Sabal.     Abelino Derrick, P.A.   ______________________________ Nanetta Batty, M.D.    Stephanie Atkinson  D:  12/19/2010  T:  12/19/2010  Job:  [id number]  Electronically Signed by Corine Shelter P.A. on 12/24/2010 09:54:54 AM Electronically Signed by Nanetta Batty M.D. on 12/28/2010 05:26:34 PM

## 2011-04-30 DIAGNOSIS — M1710 Unilateral primary osteoarthritis, unspecified knee: Secondary | ICD-10-CM | POA: Diagnosis not present

## 2011-05-28 DIAGNOSIS — E782 Mixed hyperlipidemia: Secondary | ICD-10-CM | POA: Diagnosis not present

## 2011-05-28 DIAGNOSIS — R9431 Abnormal electrocardiogram [ECG] [EKG]: Secondary | ICD-10-CM | POA: Diagnosis not present

## 2011-05-28 DIAGNOSIS — I10 Essential (primary) hypertension: Secondary | ICD-10-CM | POA: Diagnosis not present

## 2011-05-28 DIAGNOSIS — I2510 Atherosclerotic heart disease of native coronary artery without angina pectoris: Secondary | ICD-10-CM | POA: Diagnosis not present

## 2011-06-13 DIAGNOSIS — M76899 Other specified enthesopathies of unspecified lower limb, excluding foot: Secondary | ICD-10-CM | POA: Diagnosis not present

## 2011-06-25 ENCOUNTER — Ambulatory Visit: Payer: Medicare Other | Attending: Physician Assistant

## 2011-06-25 DIAGNOSIS — IMO0001 Reserved for inherently not codable concepts without codable children: Secondary | ICD-10-CM | POA: Insufficient documentation

## 2011-06-25 DIAGNOSIS — M25559 Pain in unspecified hip: Secondary | ICD-10-CM | POA: Diagnosis not present

## 2011-06-25 DIAGNOSIS — Z96649 Presence of unspecified artificial hip joint: Secondary | ICD-10-CM | POA: Diagnosis not present

## 2011-07-02 ENCOUNTER — Ambulatory Visit: Payer: Medicare Other | Attending: Internal Medicine

## 2011-07-02 DIAGNOSIS — M25559 Pain in unspecified hip: Secondary | ICD-10-CM | POA: Diagnosis not present

## 2011-07-02 DIAGNOSIS — IMO0001 Reserved for inherently not codable concepts without codable children: Secondary | ICD-10-CM | POA: Diagnosis not present

## 2011-07-04 ENCOUNTER — Ambulatory Visit: Payer: Medicare Other

## 2011-07-09 ENCOUNTER — Ambulatory Visit: Payer: Medicare Other

## 2011-08-06 DIAGNOSIS — E039 Hypothyroidism, unspecified: Secondary | ICD-10-CM | POA: Diagnosis not present

## 2011-08-06 DIAGNOSIS — I10 Essential (primary) hypertension: Secondary | ICD-10-CM | POA: Diagnosis not present

## 2011-08-06 DIAGNOSIS — N390 Urinary tract infection, site not specified: Secondary | ICD-10-CM | POA: Diagnosis not present

## 2011-08-06 DIAGNOSIS — M899 Disorder of bone, unspecified: Secondary | ICD-10-CM | POA: Diagnosis not present

## 2011-08-06 DIAGNOSIS — Z0000 Encounter for general adult medical examination without abnormal findings: Secondary | ICD-10-CM | POA: Diagnosis not present

## 2011-08-06 DIAGNOSIS — M949 Disorder of cartilage, unspecified: Secondary | ICD-10-CM | POA: Diagnosis not present

## 2011-08-06 DIAGNOSIS — Z23 Encounter for immunization: Secondary | ICD-10-CM | POA: Diagnosis not present

## 2011-08-13 DIAGNOSIS — I2510 Atherosclerotic heart disease of native coronary artery without angina pectoris: Secondary | ICD-10-CM | POA: Diagnosis not present

## 2011-08-13 DIAGNOSIS — I10 Essential (primary) hypertension: Secondary | ICD-10-CM | POA: Diagnosis not present

## 2011-08-13 DIAGNOSIS — E785 Hyperlipidemia, unspecified: Secondary | ICD-10-CM | POA: Diagnosis not present

## 2011-08-13 DIAGNOSIS — E039 Hypothyroidism, unspecified: Secondary | ICD-10-CM | POA: Diagnosis not present

## 2011-11-15 DIAGNOSIS — M545 Low back pain: Secondary | ICD-10-CM | POA: Diagnosis not present

## 2011-11-15 DIAGNOSIS — Z23 Encounter for immunization: Secondary | ICD-10-CM | POA: Diagnosis not present

## 2011-12-11 DIAGNOSIS — M1710 Unilateral primary osteoarthritis, unspecified knee: Secondary | ICD-10-CM | POA: Diagnosis not present

## 2011-12-11 DIAGNOSIS — M25669 Stiffness of unspecified knee, not elsewhere classified: Secondary | ICD-10-CM | POA: Diagnosis not present

## 2011-12-19 DIAGNOSIS — M79609 Pain in unspecified limb: Secondary | ICD-10-CM | POA: Diagnosis not present

## 2011-12-19 DIAGNOSIS — M2010 Hallux valgus (acquired), unspecified foot: Secondary | ICD-10-CM | POA: Diagnosis not present

## 2011-12-19 DIAGNOSIS — I509 Heart failure, unspecified: Secondary | ICD-10-CM | POA: Diagnosis not present

## 2011-12-19 DIAGNOSIS — M19079 Primary osteoarthritis, unspecified ankle and foot: Secondary | ICD-10-CM | POA: Diagnosis not present

## 2011-12-31 ENCOUNTER — Encounter (HOSPITAL_COMMUNITY): Payer: Self-pay | Admitting: *Deleted

## 2011-12-31 ENCOUNTER — Emergency Department (HOSPITAL_COMMUNITY)
Admission: EM | Admit: 2011-12-31 | Discharge: 2012-01-01 | Disposition: A | Discharge status: Home / Self Care | Payer: Medicare Other | Attending: Emergency Medicine | Admitting: Emergency Medicine

## 2011-12-31 DIAGNOSIS — N390 Urinary tract infection, site not specified: Secondary | ICD-10-CM | POA: Diagnosis not present

## 2011-12-31 DIAGNOSIS — R61 Generalized hyperhidrosis: Secondary | ICD-10-CM | POA: Diagnosis not present

## 2011-12-31 DIAGNOSIS — R51 Headache: Secondary | ICD-10-CM | POA: Diagnosis not present

## 2011-12-31 DIAGNOSIS — Z96649 Presence of unspecified artificial hip joint: Secondary | ICD-10-CM | POA: Insufficient documentation

## 2011-12-31 DIAGNOSIS — Z951 Presence of aortocoronary bypass graft: Secondary | ICD-10-CM | POA: Diagnosis not present

## 2011-12-31 DIAGNOSIS — Z9089 Acquired absence of other organs: Secondary | ICD-10-CM | POA: Diagnosis not present

## 2011-12-31 DIAGNOSIS — Z79899 Other long term (current) drug therapy: Secondary | ICD-10-CM | POA: Diagnosis not present

## 2011-12-31 DIAGNOSIS — I252 Old myocardial infarction: Secondary | ICD-10-CM | POA: Insufficient documentation

## 2011-12-31 DIAGNOSIS — I10 Essential (primary) hypertension: Secondary | ICD-10-CM | POA: Insufficient documentation

## 2011-12-31 DIAGNOSIS — M542 Cervicalgia: Secondary | ICD-10-CM | POA: Insufficient documentation

## 2011-12-31 DIAGNOSIS — I2510 Atherosclerotic heart disease of native coronary artery without angina pectoris: Secondary | ICD-10-CM | POA: Diagnosis not present

## 2011-12-31 DIAGNOSIS — M852 Hyperostosis of skull: Secondary | ICD-10-CM | POA: Diagnosis not present

## 2011-12-31 HISTORY — DX: Old myocardial infarction: I25.2

## 2011-12-31 LAB — CBC WITH DIFFERENTIAL/PLATELET
Basophils Absolute: 0 10*3/uL (ref 0.0–0.1)
Basophils Relative: 0 % (ref 0–1)
Eosinophils Absolute: 0 10*3/uL (ref 0.0–0.7)
Eosinophils Relative: 1 % (ref 0–5)
MCH: 27.1 pg (ref 26.0–34.0)
MCV: 83.6 fL (ref 78.0–100.0)
Neutrophils Relative %: 62 % (ref 43–77)
Platelets: 188 10*3/uL (ref 150–400)
RBC: 5.06 MIL/uL (ref 3.87–5.11)
RDW: 14.5 % (ref 11.5–15.5)

## 2011-12-31 LAB — COMPREHENSIVE METABOLIC PANEL
ALT: 23 U/L (ref 0–35)
AST: 26 U/L (ref 0–37)
Albumin: 3.8 g/dL (ref 3.5–5.2)
Alkaline Phosphatase: 137 U/L — ABNORMAL HIGH (ref 39–117)
Calcium: 10 mg/dL (ref 8.4–10.5)
GFR calc Af Amer: 45 mL/min — ABNORMAL LOW (ref >90)
Potassium: 3.9 mEq/L (ref 3.5–5.1)
Sodium: 140 mEq/L (ref 135–145)
Total Protein: 6.6 g/dL (ref 6.0–8.3)

## 2011-12-31 NOTE — ED Notes (Signed)
Pt states that 2 weeks ago at the PCP her BP was elevated. Pt states that she did not take her BP yesterday but today she was having Neck pain and a HA and she took her BP today and it was elevated at home. Pt state takes all medications regularly.

## 2012-01-01 ENCOUNTER — Emergency Department (HOSPITAL_COMMUNITY): Payer: Medicare Other

## 2012-01-01 DIAGNOSIS — E782 Mixed hyperlipidemia: Secondary | ICD-10-CM | POA: Diagnosis not present

## 2012-01-01 DIAGNOSIS — M852 Hyperostosis of skull: Secondary | ICD-10-CM | POA: Diagnosis not present

## 2012-01-01 DIAGNOSIS — I10 Essential (primary) hypertension: Secondary | ICD-10-CM | POA: Diagnosis not present

## 2012-01-01 DIAGNOSIS — I2510 Atherosclerotic heart disease of native coronary artery without angina pectoris: Secondary | ICD-10-CM | POA: Diagnosis not present

## 2012-01-01 LAB — URINALYSIS, ROUTINE W REFLEX MICROSCOPIC
Glucose, UA: NEGATIVE mg/dL
Protein, ur: NEGATIVE mg/dL
Specific Gravity, Urine: 1.012 (ref 1.005–1.030)

## 2012-01-01 LAB — URINE MICROSCOPIC-ADD ON

## 2012-01-01 LAB — TROPONIN I: Troponin I: <0.3 ng/mL (ref <0.30)

## 2012-01-01 MED ORDER — CIPROFLOXACIN HCL 500 MG PO TABS: 500.0000 mg | ORAL_TABLET | Freq: Two times a day (BID) | ORAL | Status: DC

## 2012-01-01 MED ORDER — SODIUM CHLORIDE 0.9 % IV SOLN: INTRAVENOUS | Status: DC

## 2012-01-01 MED ORDER — CIPROFLOXACIN IN D5W 400 MG/200ML IV SOLN
400.0000 mg | Freq: Once | INTRAVENOUS | Status: AC
  Administered 2012-01-01: 400 mg via INTRAVENOUS
  Filled 2012-01-01: qty 200

## 2012-01-01 NOTE — ED Notes (Signed)
pt's daughter called and requesting to speak with EDP d/t BP, EDP is aware of HBP and has already spoke with family that is present about reason why has not received meds, but pt's daughter still wanting to speak with EDP personally, EDP notified, and is currently on phone with daughter

## 2012-01-01 NOTE — ED Provider Notes (Signed)
History     CSN: [id number]  Arrival date & time 12/31/11  2116   First MD Initiated Contact with Patient 01/01/12 0002      Chief Complaint  Patient presents with  . Hypertension  . Neck Pain    (Consider location/radiation/quality/duration/timing/severity/associated sxs/prior treatment) Patient is a 78 y.o. female presenting with hypertension and neck pain. The history is provided by the patient.  Hypertension Associated symptoms include headaches. Pertinent negatives include no chest pain, no abdominal pain and no shortness of breath.  Neck Pain  Associated symptoms include headaches. Pertinent negatives include no chest pain, no numbness and no weakness.   patient presents emergency department with the complaint of neck pain base of skull head pain and high blood pressure. Patient states 2 weeks ago PCP her blood pressure was elevated C-section take her blood pressure medicines yesterday but did take him today. Patient started having neck pain and headache at the occiput area it does 7 PM this evening. Patient checked her blood pressure at that time and it was elevated. Denies any chest pain nausea vomiting or strokelike symptoms. Currently the pain has resolved. Patient does have a history of coronary artery disease. She had a cardiac bypass in 2010 and had a stent in 2012  Past Medical History  Diagnosis Date  . Hypertension   . Hx of CABG   . CAD (coronary artery disease)   . S/P laparoscopic cholecystectomy   . History of hip replacement, total   . MI, old     "mild"    Past Surgical History  Procedure Date  . Carotid stent     2012    History reviewed. No pertinent family history.  History  Substance Use Topics  . Smoking status: Never Smoker   . Smokeless tobacco: Not on file  . Alcohol Use: No    OB History    Grav Para Term Preterm Abortions TAB SAB Ect Mult Living                  Review of Systems  Constitutional: Positive for diaphoresis.  Negative for fever.  HENT: Positive for neck pain. Negative for congestion.   Eyes: Negative for visual disturbance.  Respiratory: Negative for shortness of breath.   Cardiovascular: Negative for chest pain.  Gastrointestinal: Negative for nausea, vomiting and abdominal pain.  Genitourinary: Negative for dysuria.  Musculoskeletal: Negative for back pain.  Skin: Negative for rash.  Neurological: Positive for headaches. Negative for dizziness, weakness and numbness.  Hematological: Does not bruise/bleed easily.  Psychiatric/Behavioral: Negative for confusion.    Allergies  Penicillins and Sulfa antibiotics  Home Medications   Current Outpatient Rx  Name  Route  Sig  Dispense  Refill  . ACETAMINOPHEN 325 MG PO TABS   Oral   Take 650 mg by mouth every 4 (four) hours as needed. For pain         . ALUM & MAG HYDROXIDE-SIMETH 200-200-20 MG/5ML PO SUSP   Oral   Take 15 mLs by mouth every 2 (two) hours as needed.         . AMIODARONE HCL 200 MG PO TABS   Oral   Take 100 mg by mouth daily.         . ASPIRIN EC 325 MG PO TBEC   Oral   Take 325 mg by mouth daily.         . ATORVASTATIN CALCIUM 40 MG PO TABS   Oral   Take 40 mg  by mouth daily.         [name] CLOPIDOGREL BISULFATE 75 MG PO TABS   Oral   Take 75 mg by mouth daily.         . FUROSEMIDE 40 MG PO TABS   Oral   Take 40 mg by mouth daily as needed. For fluid retention         . LEVOTHYROXINE SODIUM 50 MCG PO TABS   Oral   Take 50 mcg by mouth daily.         [name] METOPROLOL SUCCINATE ER 50 MG PO TB24   Oral   Take 25 mg by mouth daily. Take with or immediately following a meal.         . NITROGLYCERIN 0.4 MG SL SUBL   Sublingual   Place 0.4 mg under the tongue every 5 (five) minutes as needed. For chest pain         . PANTOPRAZOLE SODIUM 40 MG PO TBEC   Oral   Take 40 mg by mouth daily.         [name] VALSARTAN 320 MG PO TABS   Oral   Take 320 mg by mouth daily.           BP 201/66  Pulse  58  Temp 98.4 F (36.9 C) (Oral)  Resp 18  SpO2 100%  Physical Exam  Nursing note and vitals reviewed. Constitutional: She is oriented to person, place, and time. She appears well-developed and well-nourished. No distress.  HENT:  Head: Normocephalic and atraumatic.  Mouth/Throat: Oropharynx is clear and moist.  Eyes: Conjunctivae normal and EOM are normal. Pupils are equal, round, and reactive to light.  Neck: Normal range of motion. Neck supple.  Cardiovascular: Normal rate, regular rhythm and normal heart sounds.   No murmur heard. Pulmonary/Chest: Effort normal and breath sounds normal.  Abdominal: Soft. Bowel sounds are normal. There is no tenderness.  Musculoskeletal: Normal range of motion. She exhibits no edema.  Lymphadenopathy:    She has no cervical adenopathy.  Neurological: She is alert and oriented to person, place, and time. No cranial nerve deficit. She exhibits normal muscle tone. Coordination normal.  Skin: Skin is warm. No rash noted. She is not diaphoretic.    ED Course  Procedures (including critical care time)  Labs Reviewed  COMPREHENSIVE METABOLIC PANEL - Abnormal; Notable for the following:    Creatinine, Ser 1.24 (*)     Alkaline Phosphatase 137 (*)     GFR calc non Af Amer 38 (*)     GFR calc Af Amer 45 (*)     All other components within normal limits  URINALYSIS, ROUTINE W REFLEX MICROSCOPIC - Abnormal; Notable for the following:    Hgb urine dipstick TRACE (*)     Nitrite POSITIVE (*)     Leukocytes, UA MODERATE (*)     All other components within normal limits  URINE MICROSCOPIC-ADD ON - Abnormal; Notable for the following:    Bacteria, UA MANY (*)     All other components within normal limits  CBC WITH DIFFERENTIAL  POCT I-STAT TROPONIN I  TROPONIN I  URINE CULTURE   Dg Chest 2 View  01/01/2012  *RADIOLOGY REPORT*  Clinical Data: Acute hypertension.  Headache.  CHEST - 2 VIEW  Comparison: Portable chest x-ray 12/10/2010.  Two-view chest  x-ray 09/08/2010.  Findings: Prior sternotomy for CABG.  Cardiac silhouette mildly enlarged but stable.  Thoracic aorta mildly tortuous and atherosclerotic, unchanged.  Hilar and  mediastinal contours otherwise unremarkable.  Lungs clear.  Bronchovascular markings normal.  Pulmonary vascularity normal.  No pneumothorax.  No pleural effusions.  Degenerative changes throughout the thoracic spine.  IMPRESSION: Stable mild cardiomegaly.  No acute cardiopulmonary disease.   Original Report Authenticated By: Hulan Saas, M.D.    Ct Head Wo Contrast  01/01/2012  *RADIOLOGY REPORT*  Clinical Data: Headache.  Neck pain.  Hypertension.  CT HEAD WITHOUT CONTRAST  Technique:  Contiguous axial images were obtained from the base of the skull through the vertex without contrast.  Comparison: Unenhanced cranial CT 09/08/2010.  Findings: Mild cortical and deep atrophy consistent with age.  Mild changes of small vessel disease of the white matter diffusely, unchanged.  Old lacunar stroke involving the left basal ganglia, unchanged.  No mass lesion.  No midline shift.  No acute hemorrhage or hematoma.  No extra-axial fluid collections.  No evidence of acute infarction.  No significant interval change.  Mild changes of hyperostosis frontalis interna. Visualized paranasal sinuses, bilateral mastoid air cells, and bilateral middle ear cavities well-aerated.  Extensive bilateral carotid siphon and vertebrobasilar atherosclerosis.  IMPRESSION:  1.  No acute intracranial abnormality. 2.  Stable mild age appropriate generalized atrophy and mild chronic microvascular ischemic changes of the white matter. 3.  Old lacunar stroke involving the left basal ganglia.   Original Report Authenticated By: Hulan Saas, M.D.    Results for orders placed during the hospital encounter of 12/31/11  CBC WITH DIFFERENTIAL      Component Value Range   WBC 5.9  4.0 - 10.5 K/uL   RBC 5.06  3.87 - 5.11 MIL/uL   Hemoglobin 13.7  12.0 - 15.0 g/dL     HCT 16.1  09.6 - 04.5 %   MCV 83.6  78.0 - 100.0 fL   MCH 27.1  26.0 - 34.0 pg   MCHC 32.4  30.0 - 36.0 g/dL   RDW 40.9  81.1 - 91.4 %   Platelets 188  150 - 400 K/uL   Neutrophils Relative 62  43 - 77 %   Neutro Abs 3.6  1.7 - 7.7 K/uL   Lymphocytes Relative 30  12 - 46 %   Lymphs Abs 1.8  0.7 - 4.0 K/uL   Monocytes Relative 7  3 - 12 %   Monocytes Absolute 0.4  0.1 - 1.0 K/uL   Eosinophils Relative 1  0 - 5 %   Eosinophils Absolute 0.0  0.0 - 0.7 K/uL   Basophils Relative 0  0 - 1 %   Basophils Absolute 0.0  0.0 - 0.1 K/uL  COMPREHENSIVE METABOLIC PANEL      Component Value Range   Sodium 140  135 - 145 mEq/L   Potassium 3.9  3.5 - 5.1 mEq/L   Chloride 105  96 - 112 mEq/L   CO2 25  19 - 32 mEq/L   Glucose, Bld 97  70 - 99 mg/dL   BUN 18  6 - 23 mg/dL   Creatinine, Ser 7.82 (*) 0.50 - 1.10 mg/dL   Calcium 95.6  8.4 - 21.3 mg/dL   Total Protein 6.6  6.0 - 8.3 g/dL   Albumin 3.8  3.5 - 5.2 g/dL   AST 26  0 - 37 U/L   ALT 23  0 - 35 U/L   Alkaline Phosphatase 137 (*) 39 - 117 U/L   Total Bilirubin 0.4  0.3 - 1.2 mg/dL   GFR calc non Af Amer 38 (*) >90 mL/min  GFR calc Af Amer 45 (*) >90 mL/min  POCT I-STAT TROPONIN I      Component Value Range   Troponin i, poc 0.03  0.00 - 0.08 ng/mL   Comment 3           URINALYSIS, ROUTINE W REFLEX MICROSCOPIC      Component Value Range   Color, Urine YELLOW  YELLOW   APPearance CLEAR  CLEAR   Specific Gravity, Urine 1.012  1.005 - 1.030   pH 6.5  5.0 - 8.0   Glucose, UA NEGATIVE  NEGATIVE mg/dL   Hgb urine dipstick TRACE (*) NEGATIVE   Bilirubin Urine NEGATIVE  NEGATIVE   Ketones, ur NEGATIVE  NEGATIVE mg/dL   Protein, ur NEGATIVE  NEGATIVE mg/dL   Urobilinogen, UA 0.2  0.0 - 1.0 mg/dL   Nitrite POSITIVE (*) NEGATIVE   Leukocytes, UA MODERATE (*) NEGATIVE  TROPONIN I      Component Value Range   Troponin I <0.30  <0.30 ng/mL  URINE MICROSCOPIC-ADD ON      Component Value Range   Squamous Epithelial / LPF RARE  RARE    WBC, UA 3-6  <3 WBC/hpf   RBC / HPF 0-2  <3 RBC/hpf   Bacteria, UA MANY (*) RARE    Date: 01/01/2012  Rate: 54  Rhythm: normal sinus rhythm  QRS Axis: normal  Intervals: normal  ST/T Wave abnormalities: nonspecific ST changes  Conduction Disutrbances:first-degree A-V block   Narrative Interpretation:   Old EKG Reviewed: unchanged From 12/12/10   1. Urinary tract infection   2. Hypertension       MDM   Patient presented with a complaint of high blood pressure. She has a history of hypertension but it was higher today than usual. Came in with a blood pressure 236/70. Patient is followed the by Dr. Gery Pray from cardiology. Starting at about 7 PM. Patient had a sharp posterior headache at the base of the brain which resolved. Some diaphoresis. No chest pain no shortness of breath no neurological focal findings. Symptoms have resolved currently. No dizziness no nausea no vomiting. Patient states she had similar symptoms back when she had heart attacks or to cardiac markers were done last one was done at 6-1/2 hours after the onset of symptomatology. EKG has no changes compared to a year ago. Workup did reveal a urinary tract infection with positive nitrite. Patient given a dose of Cipro she is allergic to penicillins Rocephin was not used in the emergency part will continue that at home. She has followup with her cardiologist later today. Blood pressure was not addressed. Didn't prove some for systolics were generally around 200. Diastolics are always normal. Patient doesn't show any evidence of endorgan damage. So we'll not be addressed acutely here tonight. Her record Dr. can adjust that slowly. Head CT showed no evidence of acute stroke chest x-ray showed no evidence of congestive heart failure.         Shelda Jakes, MD 01/01/12 [phone]

## 2012-01-01 NOTE — ED Notes (Signed)
The patient is AOx4 and comfortable with her discharge instructions. 

## 2012-01-01 NOTE — ED Notes (Signed)
The patient's contact person is Janace Aris.  She can be reached at [phone].

## 2012-01-03 LAB — URINE CULTURE: Colony Count: >=100000

## 2012-01-04 NOTE — ED Notes (Signed)
+  Urine. Patient given Cipro. Resistant. Chart sent to EDP office for review. °

## 2012-01-05 ENCOUNTER — Telehealth (HOSPITAL_COMMUNITY): Payer: Self-pay | Admitting: Emergency Medicine

## 2012-01-05 NOTE — ED Notes (Signed)
Chart returned from EDP office. Per [facility] PA-C, please ask what patient's allergy is to Penicillin. If not anaphylaxis, SOB, rash, or "unknown", etc, may give Keflex 500 mg PO BID x 7 days. Follow-up with PCP. Return for worsening symptoms. If penicillin allergy is sever, patient may need to return for IV antibiotics.

## 2012-01-05 NOTE — ED Notes (Signed)
+   Urine culture. Patient notified of positive result and resistance to Cipro. RX Keflex 500 mg PO BID x seven days called to [facility].

## 2012-01-07 DIAGNOSIS — N390 Urinary tract infection, site not specified: Secondary | ICD-10-CM | POA: Diagnosis not present

## 2012-01-07 DIAGNOSIS — I10 Essential (primary) hypertension: Secondary | ICD-10-CM | POA: Diagnosis not present

## 2012-02-06 DIAGNOSIS — E039 Hypothyroidism, unspecified: Secondary | ICD-10-CM | POA: Diagnosis not present

## 2012-02-06 DIAGNOSIS — I10 Essential (primary) hypertension: Secondary | ICD-10-CM | POA: Diagnosis not present

## 2012-02-06 DIAGNOSIS — E785 Hyperlipidemia, unspecified: Secondary | ICD-10-CM | POA: Diagnosis not present

## 2012-02-06 DIAGNOSIS — E559 Vitamin D deficiency, unspecified: Secondary | ICD-10-CM | POA: Diagnosis not present

## 2012-02-12 DIAGNOSIS — I2510 Atherosclerotic heart disease of native coronary artery without angina pectoris: Secondary | ICD-10-CM | POA: Diagnosis not present

## 2012-02-12 DIAGNOSIS — E039 Hypothyroidism, unspecified: Secondary | ICD-10-CM | POA: Diagnosis not present

## 2012-02-12 DIAGNOSIS — I10 Essential (primary) hypertension: Secondary | ICD-10-CM | POA: Diagnosis not present

## 2012-02-12 DIAGNOSIS — E785 Hyperlipidemia, unspecified: Secondary | ICD-10-CM | POA: Diagnosis not present

## 2012-04-30 ENCOUNTER — Other Ambulatory Visit: Payer: Self-pay | Admitting: Internal Medicine

## 2012-04-30 DIAGNOSIS — I82609 Acute embolism and thrombosis of unspecified veins of unspecified upper extremity: Secondary | ICD-10-CM

## 2012-04-30 DIAGNOSIS — M19019 Primary osteoarthritis, unspecified shoulder: Secondary | ICD-10-CM | POA: Diagnosis not present

## 2012-04-30 DIAGNOSIS — M25519 Pain in unspecified shoulder: Secondary | ICD-10-CM | POA: Diagnosis not present

## 2012-05-01 ENCOUNTER — Other Ambulatory Visit: Payer: Medicare Other

## 2012-05-04 ENCOUNTER — Ambulatory Visit
Admission: RE | Admit: 2012-05-04 | Discharge: 2012-05-04 | Disposition: A | Discharge status: Home / Self Care | Payer: Medicare Other | Source: Ambulatory Visit | Attending: Internal Medicine | Admitting: Internal Medicine

## 2012-05-04 DIAGNOSIS — M79609 Pain in unspecified limb: Secondary | ICD-10-CM | POA: Diagnosis not present

## 2012-05-04 DIAGNOSIS — I82609 Acute embolism and thrombosis of unspecified veins of unspecified upper extremity: Secondary | ICD-10-CM

## 2012-05-06 DIAGNOSIS — M25519 Pain in unspecified shoulder: Secondary | ICD-10-CM | POA: Diagnosis not present

## 2012-05-06 DIAGNOSIS — M75100 Unspecified rotator cuff tear or rupture of unspecified shoulder, not specified as traumatic: Secondary | ICD-10-CM | POA: Diagnosis not present

## 2012-05-27 DIAGNOSIS — M25519 Pain in unspecified shoulder: Secondary | ICD-10-CM | POA: Diagnosis not present

## 2012-05-27 DIAGNOSIS — M1990 Unspecified osteoarthritis, unspecified site: Secondary | ICD-10-CM | POA: Diagnosis not present

## 2012-07-09 ENCOUNTER — Encounter: Payer: Self-pay | Admitting: *Deleted

## 2012-07-10 ENCOUNTER — Encounter: Payer: Self-pay | Admitting: Cardiovascular Disease

## 2012-07-10 ENCOUNTER — Ambulatory Visit (INDEPENDENT_AMBULATORY_CARE_PROVIDER_SITE_OTHER): Payer: Medicare Other | Admitting: Cardiology

## 2012-07-10 ENCOUNTER — Encounter: Payer: Self-pay | Admitting: Cardiology

## 2012-07-10 VITALS — BP 176/62 | HR 54 | Ht 63.0 in | Wt 160.8 lb

## 2012-07-10 DIAGNOSIS — I10 Essential (primary) hypertension: Secondary | ICD-10-CM | POA: Insufficient documentation

## 2012-07-10 DIAGNOSIS — E785 Hyperlipidemia, unspecified: Secondary | ICD-10-CM | POA: Diagnosis not present

## 2012-07-10 DIAGNOSIS — N179 Acute kidney failure, unspecified: Secondary | ICD-10-CM | POA: Insufficient documentation

## 2012-07-10 DIAGNOSIS — I4821 Permanent atrial fibrillation: Secondary | ICD-10-CM | POA: Insufficient documentation

## 2012-07-10 DIAGNOSIS — Z951 Presence of aortocoronary bypass graft: Secondary | ICD-10-CM | POA: Diagnosis not present

## 2012-07-10 DIAGNOSIS — N183 Chronic kidney disease, stage 3 unspecified: Secondary | ICD-10-CM

## 2012-07-10 DIAGNOSIS — Z9861 Coronary angioplasty status: Secondary | ICD-10-CM

## 2012-07-10 DIAGNOSIS — I25810 Atherosclerosis of coronary artery bypass graft(s) without angina pectoris: Secondary | ICD-10-CM

## 2012-07-10 DIAGNOSIS — I4891 Unspecified atrial fibrillation: Secondary | ICD-10-CM

## 2012-07-10 DIAGNOSIS — I4430 Unspecified atrioventricular block: Secondary | ICD-10-CM

## 2012-07-10 DIAGNOSIS — I480 Paroxysmal atrial fibrillation: Secondary | ICD-10-CM

## 2012-07-10 DIAGNOSIS — Z955 Presence of coronary angioplasty implant and graft: Secondary | ICD-10-CM | POA: Insufficient documentation

## 2012-07-10 MED ORDER — ASPIRIN EC 81 MG PO TBEC: 162.0000 mg | DELAYED_RELEASE_TABLET | Freq: Every day | ORAL | Status: DC

## 2012-07-10 NOTE — Patient Instructions (Addendum)
Stop Metoprolol  Decrease Aspirin to (2) 81mg  tabs a day  Return in 2 weeks   Check BP at home

## 2012-07-10 NOTE — Assessment & Plan Note (Signed)
No angina 

## 2012-07-10 NOTE — Assessment & Plan Note (Signed)
NSR/SB with long 1st AVB on 100mg  daily and Toprol low dose. She is on ASA and Plavix not Coumadin

## 2012-07-10 NOTE — Assessment & Plan Note (Signed)
B/P a little elevated today - 162/60 by me

## 2012-07-10 NOTE — Assessment & Plan Note (Signed)
Last SCr 1.2 

## 2012-07-10 NOTE — Assessment & Plan Note (Signed)
Her PR is now . Last EKG this was .

## 2012-07-10 NOTE — Assessment & Plan Note (Signed)
Placed after she presented with syncope/ bradycardia in 2012

## 2012-07-10 NOTE — Progress Notes (Signed)
07/10/2012 Stephanie Atkinson   14-Nov-1924  [id number]  Primary [name], MD Primary Cardiologist: Dr Allyson Sabal  HPI:  Delightful 79 y/o female with a history of CABG in 2006. She had LIMA-LAD, L radial-OM1-OM2, and a free RIMA-PDA. In July 2012 she presented with syncope. Cath revealed an atretic RIMA and she underwent a DES to the PDA. She was noted to be bradycardic at that time. She apparently has been on Amiodarone since her CABG for PAF. This was cut back as well as her beta blocker. She denies any syncope but admits to some DOE that gradually resolves. Her PR is now >4. She has no angina or syncope, pre-syncope. She has rare palpitations, no tachycardia.   Current Outpatient Prescriptions  Medication Sig Dispense Refill  . acetaminophen (TYLENOL) 325 MG tablet Take 650 mg by mouth every 4 (four) hours as needed. For pain      . alum & mag hydroxide-simeth (MAALOX/MYLANTA) 200-200-20 MG/5ML suspension Take 15 mLs by mouth every 2 (two) hours as needed.      [name] amiodarone (PACERONE) 200 MG tablet Take 100 mg by mouth daily.      [name] amLODipine (NORVASC) 5 MG tablet Take 5 mg by mouth daily.      [name] aspirin EC 325 MG tablet Take 325 mg by mouth daily.      [name] atorvastatin (LIPITOR) 40 MG tablet Take 40 mg by mouth daily.      . clopidogrel (PLAVIX) 75 MG tablet Take 75 mg by mouth daily.      . furosemide (LASIX) 40 MG tablet Take 40 mg by mouth daily as needed. For fluid retention      . levothyroxine (SYNTHROID, LEVOTHROID) 50 MCG tablet Take 50 mcg by mouth daily.      . metoprolol succinate (TOPROL-XL) 50 MG 24 hr tablet Take 25 mg by mouth daily. Take with or immediately following a meal.      . nitroGLYCERIN (NITROSTAT) 0.4 MG SL tablet Place 0.4 mg under the tongue every 5 (five) minutes as needed. For chest pain      . pantoprazole (PROTONIX) 40 MG tablet Take 40 mg by mouth daily.      . traMADol (ULTRAM) 50 MG tablet Take 50 mg by mouth as needed.      . valsartan (DIOVAN) 320  MG tablet Take 320 mg by mouth daily.      . ciprofloxacin (CIPRO) 500 MG tablet Take 1 tablet (500 mg total) by mouth 2 (two) times daily.  14 tablet  0   No current facility-administered medications for this visit.    Allergies  Allergen Reactions  . Penicillins   . Sulfa Antibiotics     History   Social History  . Marital Status: Single    Spouse Name: N/A    Number of Children: N/A  . Years of Education: N/A   Occupational History  . Not on file.   Social History Main Topics  . Smoking status: Never Smoker   . Smokeless tobacco: Never Used  . Alcohol Use: No  . Drug Use: No  . Sexually Active: Not on file   Other Topics Concern  . Not on file   Social History Narrative  . No narrative on file     Review of Systems: General: negative for chills, fever, night sweats or weight changes.  Cardiovascular: negative for chest pain, dyspnea on exertion, edema, orthopnea, palpitations, paroxysmal nocturnal dyspnea or shortness of breath Dermatological: negative for rash Respiratory:  negative for cough or wheezing Urologic: negative for hematuria Abdominal: negative for nausea, vomiting, diarrhea, bright red blood per rectum, melena, or hematemesis Neurologic: negative for visual changes, syncope, or dizziness All other systems reviewed and are otherwise negative except as noted above.    Blood pressure 176/62, pulse 54, height 5\' 3"  (1.6 m), weight 160 lb 12.8 oz (72.938 kg).  General appearance: alert, cooperative, no distress and looks younger than 40! Neck: no carotid bruit and no JVD Lungs: clear to auscultation bilaterally Heart: RRR without murmur Extremities: no edema Skin: Skin color, texture, turgor normal. No rashes or lesions Neurologic: Grossly normal  EKG  EKG: sinus bradycardia, 1st degree AV block, PR now .  ASSESSMENT AND PLAN:   Hypertension B/P a little elevated today - 162/60 by me  Hx of CABG No angina  Presence of stent in right  coronary artery Placed after she presented with syncope/ bradycardia in 2012  PAF (paroxysmal atrial fibrillation) NSR/SB with long 1st AVB on 100mg  daily and Toprol low dose. She is on ASA and Plavix not Coumadin  Chronic kidney disease, stage 3 Last SCr 1.2  AVB (atrioventricular block) Her PR is now . Last EKG this was .   PLAN  I am concerned about her bradycardia and prolonged PR. I will stop her Metoprolol and continue her Amiodarone 100mg  daily. I also asked her to decrease her ASA to 162mg  daily. HerB/P is high today and she has already taken her medications. She will keep a log for the next few weeks and this will be reviewed when I see her back for a follow up EKG.   Amadu Schlageter KPA-C 07/10/2012 10:29 AM

## 2012-07-29 ENCOUNTER — Ambulatory Visit (INDEPENDENT_AMBULATORY_CARE_PROVIDER_SITE_OTHER): Payer: Medicare Other | Admitting: Cardiology

## 2012-07-29 ENCOUNTER — Encounter: Payer: Self-pay | Admitting: Cardiology

## 2012-07-29 VITALS — BP 158/78 | HR 61 | Ht 64.0 in | Wt 159.0 lb

## 2012-07-29 DIAGNOSIS — I4891 Unspecified atrial fibrillation: Secondary | ICD-10-CM | POA: Diagnosis not present

## 2012-07-29 DIAGNOSIS — Z9861 Coronary angioplasty status: Secondary | ICD-10-CM

## 2012-07-29 DIAGNOSIS — N183 Chronic kidney disease, stage 3 unspecified: Secondary | ICD-10-CM

## 2012-07-29 DIAGNOSIS — Z955 Presence of coronary angioplasty implant and graft: Secondary | ICD-10-CM

## 2012-07-29 DIAGNOSIS — I480 Paroxysmal atrial fibrillation: Secondary | ICD-10-CM

## 2012-07-29 DIAGNOSIS — Z951 Presence of aortocoronary bypass graft: Secondary | ICD-10-CM | POA: Diagnosis not present

## 2012-07-29 DIAGNOSIS — I4430 Unspecified atrioventricular block: Secondary | ICD-10-CM

## 2012-07-29 DIAGNOSIS — E785 Hyperlipidemia, unspecified: Secondary | ICD-10-CM

## 2012-07-29 NOTE — Patient Instructions (Signed)
Stop Amiodarone. See me in follow up in 6 weeks.

## 2012-07-29 NOTE — Assessment & Plan Note (Signed)
She has been on Amiodarone and ASA/Plavix since CABG in 2006. She has not had any episodes of breakthrough PAF.

## 2012-07-29 NOTE — Assessment & Plan Note (Signed)
This has not significantly changed since coming off beta blocker 3 weeks ago. She is asymptomatic

## 2012-07-29 NOTE — Assessment & Plan Note (Signed)
IMA-PDA occluded, DES stent placed July 2012

## 2012-07-29 NOTE — Assessment & Plan Note (Signed)
No angina 

## 2012-07-29 NOTE — Assessment & Plan Note (Signed)
treated

## 2012-07-29 NOTE — Progress Notes (Signed)
07/29/2012 Stephanie Atkinson   [birth year]  [id number]  Primary Stephanie Neer, MD Primary Cardiologist: Dr Stephanie Atkinson  HPI: Ms Stephanie Atkinson has a history of CAD. She had CABG X 4 in 3/06. She had a DES placed to her PDA in July 2012, this was patent on follow up cath in Oct 2012. I saw her in the office 3 weeks ago and noted a long 1st degree AVB on her EKG, her PR was > 4. She was asymptomatic. She has a history of PAF since her CABG in 2006 and has been on Amiodarone, ASA, and Plavix. I don't believe she has had any breakthrough PAF. I saw her in follow up today after stopping her Metoprolol 3 weeks ago. She continues to do well, no palpitation or syncope. Her PR remains prolonged. I reviewed this with Dr Stephanie Atkinson in the office. We will stop her Amiodarone. I'll see her in follow up in 6 weeks. For now she will continue to take ASA/ Plavix. She may ultimately require a pacemaker if she has recurrent PAF.   Current Outpatient Prescriptions  Medication Sig Dispense Refill  . acetaminophen (TYLENOL) 325 MG tablet Take 650 mg by mouth every 4 (four) hours as needed. For pain      . alum & mag hydroxide-simeth (MAALOX/MYLANTA) 200-200-20 MG/5ML suspension Take 15 mLs by mouth every 2 (two) hours as needed.      [name] amLODipine (NORVASC) 5 MG tablet Take 5 mg by mouth daily.      [name] aspirin EC 81 MG tablet Take 2 tablets (162 mg total) by mouth daily.  90 tablet  3  . atorvastatin (LIPITOR) 40 MG tablet Take 40 mg by mouth daily.      . clopidogrel (PLAVIX) 75 MG tablet Take 75 mg by mouth daily.      . furosemide (LASIX) 40 MG tablet Take 40 mg by mouth daily as needed. For fluid retention      . levothyroxine (SYNTHROID, LEVOTHROID) 50 MCG tablet Take 50 mcg by mouth daily.      . nitroGLYCERIN (NITROSTAT) 0.4 MG SL tablet Place 0.4 mg under the tongue every 5 (five) minutes as needed. For chest pain      . pantoprazole (PROTONIX) 40 MG tablet Take 40 mg by mouth daily.      . traMADol (ULTRAM) 50 MG tablet  Take 50 mg by mouth as needed.      . valsartan (DIOVAN) 320 MG tablet Take 320 mg by mouth daily.       No current facility-administered medications for this visit.    Allergies  Allergen Reactions  . Penicillins Hives  . Sulfa Antibiotics Swelling    History   Social History  . Marital Status: Single    Spouse Name: N/A    Number of Children: N/A  . Years of Education: N/A   Occupational History  . Not on file.   Social History Main Topics  . Smoking status: Never Smoker   . Smokeless tobacco: Current User    Types: Snuff  . Alcohol Use: No  . Drug Use: No  . Sexually Active: Not on file   Other Topics Concern  . Not on file   Social History Narrative  . No narrative on file     Review of Systems: General: negative for chills, fever, night sweats or weight changes.  Cardiovascular: negative for chest pain, dyspnea on exertion, edema, orthopnea, palpitations, paroxysmal nocturnal dyspnea or shortness of breath Dermatological: negative for rash Respiratory:  negative for cough or wheezing Urologic: negative for hematuria Abdominal: negative for nausea, vomiting, diarrhea, bright red blood per rectum, melena, or hematemesis Neurologic: negative for visual changes, syncope, or dizziness All other systems reviewed and are otherwise negative except as noted above.    Blood pressure 158/78, pulse 61, height 5\' 4"  (1.626 m), weight 159 lb (72.122 kg).  General appearance: alert, cooperative and no distress Lungs: clear to auscultation bilaterally Heart: regular rate and rhythm  EKG  EKG: normal EKG, normal sinus rhythm, unchanged from previous tracings, 1st degree AV block.  ASSESSMENT AND PLAN:   AVB (atrioventricular block) This has not significantly changed since coming off beta blocker 3 weeks ago. She is asymptomatic  Hx of CABG X 4 3/06- PDA DES 7/12, this was patent 10/12 No angina  Presence of stent in right coronary artery IMA-PDA occluded, DES  stent placed July 2012  Chronic kidney disease, stage 3 Scr 1.19 Jan 2012  Hyperlipidemia treated  PAF (paroxysmal atrial fibrillation) She has been on Amiodarone and ASA/Plavix since CABG in 2006. She has not had any episodes of breakthrough PAF.   PLAN  Stop Amiodarone. Follow up in 6 weeks.   Rocklin Soderquist KPA-C 07/29/2012 11:44 AM

## 2012-07-29 NOTE — Assessment & Plan Note (Signed)
Scr 1.19 Jan 2012

## 2012-08-07 DIAGNOSIS — E039 Hypothyroidism, unspecified: Secondary | ICD-10-CM | POA: Diagnosis not present

## 2012-08-07 DIAGNOSIS — I10 Essential (primary) hypertension: Secondary | ICD-10-CM | POA: Diagnosis not present

## 2012-08-07 DIAGNOSIS — M949 Disorder of cartilage, unspecified: Secondary | ICD-10-CM | POA: Diagnosis not present

## 2012-08-07 DIAGNOSIS — E785 Hyperlipidemia, unspecified: Secondary | ICD-10-CM | POA: Diagnosis not present

## 2012-08-07 DIAGNOSIS — M899 Disorder of bone, unspecified: Secondary | ICD-10-CM | POA: Diagnosis not present

## 2012-08-10 DIAGNOSIS — N390 Urinary tract infection, site not specified: Secondary | ICD-10-CM | POA: Diagnosis not present

## 2012-08-14 DIAGNOSIS — I10 Essential (primary) hypertension: Secondary | ICD-10-CM | POA: Diagnosis not present

## 2012-08-14 DIAGNOSIS — E785 Hyperlipidemia, unspecified: Secondary | ICD-10-CM | POA: Diagnosis not present

## 2012-08-14 DIAGNOSIS — I2510 Atherosclerotic heart disease of native coronary artery without angina pectoris: Secondary | ICD-10-CM | POA: Diagnosis not present

## 2012-08-14 DIAGNOSIS — E039 Hypothyroidism, unspecified: Secondary | ICD-10-CM | POA: Diagnosis not present

## 2012-10-06 ENCOUNTER — Encounter: Payer: Self-pay | Admitting: Cardiology

## 2012-10-06 ENCOUNTER — Ambulatory Visit (INDEPENDENT_AMBULATORY_CARE_PROVIDER_SITE_OTHER): Payer: Medicare Other | Admitting: Cardiology

## 2012-10-06 ENCOUNTER — Telehealth: Payer: Self-pay | Admitting: *Deleted

## 2012-10-06 VITALS — BP 146/60 | HR 56 | Ht 64.0 in | Wt 158.2 lb

## 2012-10-06 DIAGNOSIS — I4430 Unspecified atrioventricular block: Secondary | ICD-10-CM

## 2012-10-06 DIAGNOSIS — Z951 Presence of aortocoronary bypass graft: Secondary | ICD-10-CM | POA: Diagnosis not present

## 2012-10-06 DIAGNOSIS — I10 Essential (primary) hypertension: Secondary | ICD-10-CM

## 2012-10-06 DIAGNOSIS — R079 Chest pain, unspecified: Secondary | ICD-10-CM

## 2012-10-06 DIAGNOSIS — I480 Paroxysmal atrial fibrillation: Secondary | ICD-10-CM

## 2012-10-06 DIAGNOSIS — N183 Chronic kidney disease, stage 3 unspecified: Secondary | ICD-10-CM

## 2012-10-06 DIAGNOSIS — I4891 Unspecified atrial fibrillation: Secondary | ICD-10-CM

## 2012-10-06 DIAGNOSIS — Z7901 Long term (current) use of anticoagulants: Secondary | ICD-10-CM

## 2012-10-06 DIAGNOSIS — Z79899 Other long term (current) drug therapy: Secondary | ICD-10-CM

## 2012-10-06 DIAGNOSIS — R5381 Other malaise: Secondary | ICD-10-CM

## 2012-10-06 MED ORDER — APIXABAN 2.5 MG PO TABS: 2.5000 mg | ORAL_TABLET | Freq: Two times a day (BID) | ORAL | Status: DC

## 2012-10-06 NOTE — Assessment & Plan Note (Signed)
Beta blocker, then Amiodarone stopped June 2014

## 2012-10-06 NOTE — Assessment & Plan Note (Signed)
No chest pain

## 2012-10-06 NOTE — Patient Instructions (Addendum)
Start Eliquis 2.5mg  twice a day.  Have labwork done in 2 weeks.  Your physician recommends that you schedule a follow-up appointment in: 3 weeks  Stop Plavix

## 2012-10-06 NOTE — Telephone Encounter (Signed)
Patient notified to stop clopidogrel.  Patient voiced understanding.

## 2012-10-06 NOTE — Assessment & Plan Note (Signed)
She is now in AF with CVR.

## 2012-10-06 NOTE — Progress Notes (Signed)
10/06/2012 Stephanie Atkinson   [birth year]  [id number]  Primary [name], MD Primary Cardiologist: Dr Allyson Sabal  HPI:  Ms Stuber has a history of CAD. She had CABG X 4 in 3/06. She had a DES placed to her PDA in July 2012, this was patent on follow up cath in Oct 2012. I saw her in the office 07/10/12 and noted a long 1st degree AVB on her EKG, her PR was > 4. She was asymptomatic. She has a history of PAF since her CABG in 2006 and had been on Toprol, Amiodarone, ASA, and Plavix. At that time we stopped her Toprol and decreased her Amiodarone. I saw her in follow up 07/29/12. Her PR remained prolonged. I reviewed this with Dr Royann Shivers in the office. We stopped her Amiodarone at that time. She presents today for follow up. She denies any tachycardia or SOB. Today she is in AF with a VR of 58. She remains asymptomatic. She does not drive but is otherwise active.     Current Outpatient Prescriptions  Medication Sig Dispense Refill  . acetaminophen (TYLENOL) 325 MG tablet Take 650 mg by mouth every 4 (four) hours as needed. For pain      . alum & mag hydroxide-simeth (MAALOX/MYLANTA) 200-200-20 MG/5ML suspension Take 15 mLs by mouth every 2 (two) hours as needed.      [name] amLODipine (NORVASC) 5 MG tablet Take 5 mg by mouth daily.      [name] aspirin EC 81 MG tablet Take 2 tablets (162 mg total) by mouth daily.  90 tablet  3  . atorvastatin (LIPITOR) 40 MG tablet Take 40 mg by mouth daily.      . clopidogrel (PLAVIX) 75 MG tablet Take 75 mg by mouth daily.      . furosemide (LASIX) 40 MG tablet Take 40 mg by mouth daily as needed. For fluid retention      . levothyroxine (SYNTHROID, LEVOTHROID) 50 MCG tablet Take 50 mcg by mouth daily.      . nitroGLYCERIN (NITROSTAT) 0.4 MG SL tablet Place 0.4 mg under the tongue every 5 (five) minutes as needed. For chest pain      . pantoprazole (PROTONIX) 40 MG tablet Take 40 mg by mouth daily.      . traMADol (ULTRAM) 50 MG tablet Take 50 mg by mouth as needed.       . valsartan (DIOVAN) 320 MG tablet Take 320 mg by mouth daily.      [name] apixaban (ELIQUIS) 2.5 MG TABS tablet Take 1 tablet (2.5 mg total) by mouth 2 (two) times daily.  60 tablet  5   No current facility-administered medications for this visit.    Allergies  Allergen Reactions  . Penicillins Hives  . Sulfa Antibiotics Swelling    History   Social History  . Marital Status: Single    Spouse Name: N/A    Number of Children: N/A  . Years of Education: N/A   Occupational History  . Not on file.   Social History Main Topics  . Smoking status: Never Smoker   . Smokeless tobacco: Current User    Types: Snuff  . Alcohol Use: No  . Drug Use: No  . Sexually Active: Not on file   Other Topics Concern  . Not on file   Social History Narrative  . No narrative on file     Review of Systems: General: negative for chills, fever, night sweats or weight changes.  Cardiovascular: negative for chest  pain, dyspnea on exertion, edema, orthopnea, palpitations, paroxysmal nocturnal dyspnea or shortness of breath Dermatological: negative for rash Respiratory: negative for cough or wheezing Urologic: negative for hematuria Abdominal: negative for nausea, vomiting, diarrhea, bright red blood per rectum, melena, or hematemesis Neurologic: negative for visual changes, syncope, or dizziness All other systems reviewed and are otherwise negative except as noted above.    Blood pressure 146/60, pulse 56, height 5\' 4"  (1.626 m), weight 158 lb 3.2 oz (71.759 kg).  General appearance: alert, cooperative and no distress Neck: no carotid bruit and no JVD Lungs: clear to auscultation bilaterally Heart: irregularly irregular rhythm Extremities: no edema  EKG  EKG: AF with VR 56, one PVC, LVH with repol changes.  ASSESSMENT AND PLAN:   PAF (paroxysmal atrial fibrillation) She is now in AF with CVR.  AVB (atrioventricular block) Beta blocker, then Amiodarone stopped June 2014  Hx of CABG X  4 3/06- PDA DES 7/12, this was patent 10/12 No chest pain  Chronic kidney disease, stage 3 .   Hypertension controlled   PLAN  Since she does not drive and relies on public transportation I elected to start her on Eloquis. He CHAd2Vasc is 5. I stopped her Plavix and continued her baby ASA.  I discussed this with Dr Rennis Golden. She'll return in 3 weeks.   Sanda Dejoy KPA-C 10/06/2012 1:26 PM

## 2012-10-06 NOTE — Assessment & Plan Note (Signed)
controlled 

## 2012-10-07 ENCOUNTER — Telehealth: Payer: Self-pay | Admitting: Cardiology

## 2012-10-07 NOTE — Telephone Encounter (Signed)
Left message with directions on how she should take the eliquis.

## 2012-10-07 NOTE — Telephone Encounter (Signed)
Returned patient's phone message requesting info on how to take the eliquis she was given yesterday. Left message per office records she should have 2.5 mg tablet to take 1 tablet twice daily. Call back if questions.

## 2012-10-07 NOTE — Telephone Encounter (Signed)
Please call-need the correct directions for her Eliquis.Please call today so she can take start taking it.

## 2012-10-10 ENCOUNTER — Inpatient Hospital Stay (HOSPITAL_COMMUNITY)
Admission: EM | Admit: 2012-10-10 | Discharge: 2012-10-13 | DRG: 287 | Disposition: A | Discharge status: Home / Self Care | Payer: Medicare Other | Attending: Cardiovascular Disease | Admitting: Cardiovascular Disease

## 2012-10-10 ENCOUNTER — Emergency Department (HOSPITAL_COMMUNITY): Payer: Medicare Other

## 2012-10-10 ENCOUNTER — Encounter (HOSPITAL_COMMUNITY): Payer: Self-pay | Admitting: *Deleted

## 2012-10-10 DIAGNOSIS — R55 Syncope and collapse: Secondary | ICD-10-CM | POA: Diagnosis not present

## 2012-10-10 DIAGNOSIS — F17200 Nicotine dependence, unspecified, uncomplicated: Secondary | ICD-10-CM | POA: Diagnosis present

## 2012-10-10 DIAGNOSIS — Z9861 Coronary angioplasty status: Secondary | ICD-10-CM | POA: Diagnosis not present

## 2012-10-10 DIAGNOSIS — N281 Cyst of kidney, acquired: Secondary | ICD-10-CM | POA: Diagnosis not present

## 2012-10-10 DIAGNOSIS — I200 Unstable angina: Secondary | ICD-10-CM | POA: Diagnosis present

## 2012-10-10 DIAGNOSIS — Z7902 Long term (current) use of antithrombotics/antiplatelets: Secondary | ICD-10-CM | POA: Diagnosis not present

## 2012-10-10 DIAGNOSIS — Z79899 Other long term (current) drug therapy: Secondary | ICD-10-CM

## 2012-10-10 DIAGNOSIS — Z7901 Long term (current) use of anticoagulants: Secondary | ICD-10-CM | POA: Diagnosis not present

## 2012-10-10 DIAGNOSIS — I2510 Atherosclerotic heart disease of native coronary artery without angina pectoris: Principal | ICD-10-CM | POA: Diagnosis present

## 2012-10-10 DIAGNOSIS — I4430 Unspecified atrioventricular block: Secondary | ICD-10-CM | POA: Diagnosis not present

## 2012-10-10 DIAGNOSIS — I10 Essential (primary) hypertension: Secondary | ICD-10-CM

## 2012-10-10 DIAGNOSIS — N179 Acute kidney failure, unspecified: Secondary | ICD-10-CM | POA: Diagnosis present

## 2012-10-10 DIAGNOSIS — I129 Hypertensive chronic kidney disease with stage 1 through stage 4 chronic kidney disease, or unspecified chronic kidney disease: Secondary | ICD-10-CM | POA: Diagnosis not present

## 2012-10-10 DIAGNOSIS — I252 Old myocardial infarction: Secondary | ICD-10-CM | POA: Diagnosis not present

## 2012-10-10 DIAGNOSIS — E785 Hyperlipidemia, unspecified: Secondary | ICD-10-CM | POA: Diagnosis not present

## 2012-10-10 DIAGNOSIS — I440 Atrioventricular block, first degree: Secondary | ICD-10-CM | POA: Diagnosis present

## 2012-10-10 DIAGNOSIS — Z7982 Long term (current) use of aspirin: Secondary | ICD-10-CM

## 2012-10-10 DIAGNOSIS — R0602 Shortness of breath: Secondary | ICD-10-CM | POA: Diagnosis not present

## 2012-10-10 DIAGNOSIS — E039 Hypothyroidism, unspecified: Secondary | ICD-10-CM | POA: Diagnosis present

## 2012-10-10 DIAGNOSIS — I4891 Unspecified atrial fibrillation: Secondary | ICD-10-CM | POA: Diagnosis present

## 2012-10-10 DIAGNOSIS — I480 Paroxysmal atrial fibrillation: Secondary | ICD-10-CM

## 2012-10-10 DIAGNOSIS — Z955 Presence of coronary angioplasty implant and graft: Secondary | ICD-10-CM

## 2012-10-10 DIAGNOSIS — R079 Chest pain, unspecified: Secondary | ICD-10-CM

## 2012-10-10 DIAGNOSIS — N183 Chronic kidney disease, stage 3 unspecified: Secondary | ICD-10-CM | POA: Diagnosis present

## 2012-10-10 DIAGNOSIS — R0789 Other chest pain: Secondary | ICD-10-CM | POA: Diagnosis not present

## 2012-10-10 DIAGNOSIS — I4821 Permanent atrial fibrillation: Secondary | ICD-10-CM | POA: Diagnosis present

## 2012-10-10 DIAGNOSIS — Z951 Presence of aortocoronary bypass graft: Secondary | ICD-10-CM

## 2012-10-10 DIAGNOSIS — Z8679 Personal history of other diseases of the circulatory system: Secondary | ICD-10-CM | POA: Diagnosis present

## 2012-10-10 HISTORY — DX: Atherosclerotic heart disease of native coronary artery without angina pectoris: I25.10

## 2012-10-10 LAB — BASIC METABOLIC PANEL
Chloride: 105 mEq/L (ref 96–112)
GFR calc Af Amer: 54 mL/min — ABNORMAL LOW (ref >90)
GFR calc non Af Amer: 46 mL/min — ABNORMAL LOW (ref >90)
Potassium: 4.1 mEq/L (ref 3.5–5.1)
Sodium: 139 mEq/L (ref 135–145)

## 2012-10-10 LAB — POCT I-STAT TROPONIN I: Troponin i, poc: 0.07 ng/mL (ref 0.00–0.08)

## 2012-10-10 LAB — CBC
HCT: 37.5 % (ref 36.0–46.0)
Hemoglobin: 12.8 g/dL (ref 12.0–15.0)
MCH: 28.6 pg (ref 26.0–34.0)
MCHC: 34.1 g/dL (ref 30.0–36.0)
MCV: 83.7 fL (ref 78.0–100.0)
Platelets: 199 10*3/uL (ref 150–400)
RBC: 4.48 MIL/uL (ref 3.87–5.11)
RDW: 13.1 % (ref 11.5–15.5)
WBC: 5 10*3/uL (ref 4.0–10.5)

## 2012-10-10 LAB — BASIC METABOLIC PANEL WITH GFR
BUN: 23 mg/dL (ref 6–23)
CO2: 23 meq/L (ref 19–32)
Calcium: 9.8 mg/dL (ref 8.4–10.5)
Creatinine, Ser: 1.06 mg/dL (ref 0.50–1.10)
Glucose, Bld: 105 mg/dL — ABNORMAL HIGH (ref 70–99)

## 2012-10-10 MED ORDER — NITROGLYCERIN 0.4 MG SL SUBL: 0.4000 mg | SUBLINGUAL_TABLET | SUBLINGUAL | Status: DC | PRN

## 2012-10-10 MED ORDER — NITROGLYCERIN 2 % TD OINT
1.0000 [in_us] | TOPICAL_OINTMENT | Freq: Once | TRANSDERMAL | Status: AC
  Administered 2012-10-11: 1 [in_us] via TOPICAL
  Filled 2012-10-10 (×2): qty 1

## 2012-10-10 NOTE — ED Notes (Signed)
The pt has had  Some lt breast pain for 2-3 days and the pain increases when she lefts her lt arm.  Minimal sob.  Hx coronary artery disease

## 2012-10-10 NOTE — ED Notes (Signed)
Ghim, MD at bedside. 

## 2012-10-10 NOTE — ED Notes (Signed)
Pt is having 6/10 chest pressure, with radiation to her left arm, but only when she lifts the arm. Pt states that this bout of chest pain has been going on for 5 minutes. Dr. Oletta Lamas aware.

## 2012-10-10 NOTE — ED Provider Notes (Signed)
CSN: [id number]     Arrival date & time 10/10/12  2013 History     First MD Initiated Contact with Patient 10/10/12 2207     Chief Complaint  Patient presents with  . Chest Pain   (Consider location/radiation/quality/duration/timing/severity/associated sxs/prior Treatment) HPI Comments: Pt with h/o CABG, cardiac stent, usually doesn't have anginal pain, has had frequent episodes, brief of pain in left breast radiating deep into chest for the past 3-4 days, used NTG for the first time in about 1 year.  No SOB, sweats, nausea.  Pt reports pain is very similar to what she used to experience that led then to find CAD.  No cough, congestion, GERD or heart burn.  Pain lasts about 2-3 minutes.  Has had some pain intermittent in the ED but doesn't remain.    Patient is a 79 y.o. female presenting with chest pain. The history is provided by the patient, medical records and a relative.  Chest Pain Pain quality: aching and throbbing   Pain radiates to:  Does not radiate Pain radiates to the back: no   Pain severity:  Mild Onset quality:  Sudden Duration:  4 days Timing:  Intermittent Progression:  Unchanged Chronicity:  New Associated symptoms: no cough, no fever, no nausea and no shortness of breath     Past Medical History  Diagnosis Date  . Hypertension   . Hx of CABG 2006    X4  . MI, old     "mild"  . Hyperlipidemia   . Presence of stent in right coronary artery 07/12    RIMA-PDA occluded, DES stent placed  . PAF (paroxysmal atrial fibrillation)     on AMIO  . Syncope 07/12    bradycardic, beta blocker decreased  . Chronic kidney disease, stage 3   . Hypothyroid   . Coronary artery disease    Past Surgical History  Procedure Laterality Date  . Carotid stent      2012  . Cardiac catheterization  [id number]    dr. Onalee Hua harding, revealing a atretic bypass to her right with patent distal RCA stent  . Doppler echocardiography  [id number]    mild asymmetric left ventricular  hypertrophy, left ventricular systolic function is low normal, ejection fraction = 50-55%, the LA is moderate dilated, the RA is mildly dilated, no significant valvular disease  . Nm myoview ltd  100510    post stress left ventricle is normal in size, post stress ejection fraction is 67% global left ventricular systolic function is normal, normal myocardial perfusion study, abnormal myocardial perfusion study, low risk scan   History reviewed. No pertinent family history. History  Substance Use Topics  . Smoking status: Never Smoker   . Smokeless tobacco: Current User    Types: Snuff  . Alcohol Use: No   OB History   Grav Para Term Preterm Abortions TAB SAB Ect Mult Living                 Review of Systems  Constitutional: Negative for fever and activity change.  Respiratory: Negative for cough and shortness of breath.   Cardiovascular: Positive for chest pain.  Gastrointestinal: Negative for nausea.  All other systems reviewed and are negative.    Allergies  Penicillins and Sulfa antibiotics  Home Medications   No current outpatient prescriptions on file. BP 149/56  Pulse 76  Temp(Src) 98.2 F (36.8 C) (Oral)  Resp 20  Ht 5\' 4"  (1.626 m)  Wt 158 lb 8 oz (71.895 kg)  BMI 27.19 kg/m2  SpO2 100% Physical Exam  Nursing note and vitals reviewed. Constitutional: She is oriented to person, place, and time. She appears well-developed and well-nourished. No distress.  HENT:  Head: Normocephalic and atraumatic.  Eyes: EOM are normal.  Neck: Neck supple.  Cardiovascular: Normal rate and regular rhythm.   Pulmonary/Chest: Effort normal. No respiratory distress. She has no wheezes. She exhibits no tenderness.  Abdominal: Soft. She exhibits no distension. There is no tenderness. There is no rebound.  Musculoskeletal: She exhibits no edema.  Neurological: She is alert and oriented to person, place, and time.  Skin: She is not diaphoretic.  Psychiatric: She has a normal mood  and affect.    ED Course   Procedures (including critical care time)  Labs Reviewed  BASIC METABOLIC PANEL - Abnormal; Notable for the following:    Glucose, Bld 105 (*)    GFR calc non Af Amer 46 (*)    GFR calc Af Amer 54 (*)    All other components within normal limits  BASIC METABOLIC PANEL - Abnormal; Notable for the following:    Glucose, Bld 104 (*)    Creatinine, Ser 1.12 (*)    GFR calc non Af Amer 43 (*)    GFR calc Af Amer 50 (*)    All other components within normal limits  CBC - Abnormal; Notable for the following:    Hemoglobin 11.6 (*)    All other components within normal limits  CBC  TROPONIN I  TROPONIN I  TROPONIN I  POCT I-STAT TROPONIN I  POCT I-STAT TROPONIN I   Dg Chest Port 1 View  10/10/2012   *RADIOLOGY REPORT*  Clinical Data: 79 year old female shortness of breath and chest pain.  PORTABLE CHEST - 1 VIEW  Comparison: 01/01/2012 and earlier.  Findings: Portable upright AP view 2252 hours.  Mildly lower lung volumes. Stable cardiomegaly and mediastinal contours.  Sequelae of CABG.  Allowing for portable technique, the lungs are clear.  IMPRESSION: No acute cardiopulmonary abnormality.   Original Report Authenticated By: Erskine Speed, M.D.   1. Chest pain     ECG at time 20:17 shows SR with first degree AV block, anterior Q waves with poor r wave progression from leads V1-V3, ST depression laterall, T wave inversion in leads AVL.  Abn ECG.  No sig change from ECG on 10/06/12 except atrial fib no longer seen.    11:39 PM Pt is now having recurrence of CP and this time lasting longer than prior.  Will obtain repeat ECG.  Will give NTG and recheck troponin.     12:20 AM  Troponin repeat was normal.  Pain improved on its own when I went to see pt.  Spoke to Dr. Lovell Sheehan to admit pt for observation.  Nitro paste applied.   MDM  Pt with CP that seems to be her anginal equivalent, but has not occurred in some time, some concern for UA.  Troponin I is WNL  here times 1, no new ischemic changes on ECG and pain is intermittent, very brief, rather atypical.  Pt did take her ASA this AM she reports.    Gavin Pound. Oletta Lamas, MD 10/11/12 1610

## 2012-10-10 NOTE — ED Notes (Signed)
Pt states that she cannot remember how she felt with her last MI, but her daughter states that the pt become became flushed and complained of being hot, then passed out. Pt states that she was started on a new medication earlier in the week.

## 2012-10-11 ENCOUNTER — Encounter (HOSPITAL_COMMUNITY): Payer: Self-pay | Admitting: General Practice

## 2012-10-11 DIAGNOSIS — I10 Essential (primary) hypertension: Secondary | ICD-10-CM

## 2012-10-11 DIAGNOSIS — I200 Unstable angina: Secondary | ICD-10-CM

## 2012-10-11 DIAGNOSIS — R0789 Other chest pain: Secondary | ICD-10-CM

## 2012-10-11 DIAGNOSIS — R079 Chest pain, unspecified: Secondary | ICD-10-CM

## 2012-10-11 DIAGNOSIS — Z8679 Personal history of other diseases of the circulatory system: Secondary | ICD-10-CM | POA: Diagnosis present

## 2012-10-11 DIAGNOSIS — I4891 Unspecified atrial fibrillation: Secondary | ICD-10-CM

## 2012-10-11 DIAGNOSIS — Z9861 Coronary angioplasty status: Secondary | ICD-10-CM

## 2012-10-11 DIAGNOSIS — Z951 Presence of aortocoronary bypass graft: Secondary | ICD-10-CM

## 2012-10-11 DIAGNOSIS — E785 Hyperlipidemia, unspecified: Secondary | ICD-10-CM

## 2012-10-11 LAB — CBC
Platelets: 182 10*3/uL (ref 150–400)
RDW: 13.3 % (ref 11.5–15.5)
WBC: 6 10*3/uL (ref 4.0–10.5)

## 2012-10-11 LAB — TROPONIN I
Troponin I: <0.3 ng/mL (ref <0.30)
Troponin I: <0.3 ng/mL (ref <0.30)
Troponin I: <0.3 ng/mL (ref <0.30)

## 2012-10-11 LAB — BASIC METABOLIC PANEL
Calcium: 9.8 mg/dL (ref 8.4–10.5)
Chloride: 111 mEq/L (ref 96–112)
Creatinine, Ser: 1.12 mg/dL — ABNORMAL HIGH (ref 0.50–1.10)
GFR calc Af Amer: 50 mL/min — ABNORMAL LOW (ref >90)
GFR calc non Af Amer: 43 mL/min — ABNORMAL LOW (ref >90)

## 2012-10-11 LAB — POCT I-STAT TROPONIN I: Troponin i, poc: 0.03 ng/mL (ref 0.00–0.08)

## 2012-10-11 MED ORDER — HYDROMORPHONE HCL PF 1 MG/ML IJ SOLN: 0.5000 mg | INTRAMUSCULAR | Status: DC | PRN

## 2012-10-11 MED ORDER — APIXABAN 2.5 MG PO TABS
2.5000 mg | ORAL_TABLET | Freq: Two times a day (BID) | ORAL | Status: DC
  Administered 2012-10-11: 2.5 mg via ORAL
  Filled 2012-10-11 (×2): qty 1

## 2012-10-11 MED ORDER — FUROSEMIDE 40 MG PO TABS
40.0000 mg | ORAL_TABLET | Freq: Every day | ORAL | Status: DC
  Administered 2012-10-11: 40 mg via ORAL
  Filled 2012-10-11: qty 1

## 2012-10-11 MED ORDER — ATORVASTATIN CALCIUM 40 MG PO TABS
40.0000 mg | ORAL_TABLET | Freq: Every day | ORAL | Status: DC
  Administered 2012-10-11 – 2012-10-13 (×3): 40 mg via ORAL
  Filled 2012-10-11 (×3): qty 1

## 2012-10-11 MED ORDER — LEVOTHYROXINE SODIUM 50 MCG PO TABS
50.0000 ug | ORAL_TABLET | Freq: Every day | ORAL | Status: DC
  Administered 2012-10-11 – 2012-10-13 (×3): 50 ug via ORAL
  Filled 2012-10-11 (×4): qty 1

## 2012-10-11 MED ORDER — TRAMADOL HCL 50 MG PO TABS
50.0000 mg | ORAL_TABLET | Freq: Four times a day (QID) | ORAL | Status: DC | PRN
  Administered 2012-10-11 – 2012-10-13 (×5): 50 mg via ORAL
  Filled 2012-10-11 (×5): qty 1

## 2012-10-11 MED ORDER — ALUM & MAG HYDROXIDE-SIMETH 200-200-20 MG/5ML PO SUSP: 30.0000 mL | Freq: Four times a day (QID) | ORAL | Status: DC | PRN

## 2012-10-11 MED ORDER — PANTOPRAZOLE SODIUM 40 MG PO TBEC
40.0000 mg | DELAYED_RELEASE_TABLET | Freq: Every day | ORAL | Status: DC
  Administered 2012-10-11 – 2012-10-13 (×3): 40 mg via ORAL
  Filled 2012-10-11 (×3): qty 1

## 2012-10-11 MED ORDER — DIAZEPAM 2 MG PO TABS
2.0000 mg | ORAL_TABLET | ORAL | Status: AC
  Administered 2012-10-12: 2 mg via ORAL
  Filled 2012-10-11: qty 1

## 2012-10-11 MED ORDER — SODIUM CHLORIDE 0.9 % IV SOLN
INTRAVENOUS | Status: DC
  Administered 2012-10-11: 01:00:00 via INTRAVENOUS

## 2012-10-11 MED ORDER — NITROGLYCERIN 0.4 MG SL SUBL: 0.4000 mg | SUBLINGUAL_TABLET | SUBLINGUAL | Status: DC | PRN

## 2012-10-11 MED ORDER — APIXABAN 2.5 MG PO TABS: 2.5000 mg | ORAL_TABLET | Freq: Two times a day (BID) | ORAL | Status: DC

## 2012-10-11 MED ORDER — ONDANSETRON HCL 4 MG/2ML IJ SOLN: 4.0000 mg | Freq: Four times a day (QID) | INTRAMUSCULAR | Status: DC | PRN

## 2012-10-11 MED ORDER — OXYCODONE HCL 5 MG PO TABS
5.0000 mg | ORAL_TABLET | ORAL | Status: DC | PRN
  Filled 2012-10-11: qty 1

## 2012-10-11 MED ORDER — ACETAMINOPHEN 650 MG RE SUPP: 650.0000 mg | Freq: Four times a day (QID) | RECTAL | Status: DC | PRN

## 2012-10-11 MED ORDER — ZOLPIDEM TARTRATE 5 MG PO TABS: 5.0000 mg | ORAL_TABLET | Freq: Every evening | ORAL | Status: DC | PRN

## 2012-10-11 MED ORDER — ONDANSETRON HCL 4 MG PO TABS: 4.0000 mg | ORAL_TABLET | Freq: Four times a day (QID) | ORAL | Status: DC | PRN

## 2012-10-11 MED ORDER — FUROSEMIDE 40 MG PO TABS
40.0000 mg | ORAL_TABLET | Freq: Every day | ORAL | Status: DC
  Filled 2012-10-11: qty 1

## 2012-10-11 MED ORDER — SODIUM CHLORIDE 0.9 % IV SOLN
1.0000 mL/kg/h | INTRAVENOUS | Status: DC
  Administered 2012-10-11: 1 mL/kg/h via INTRAVENOUS

## 2012-10-11 MED ORDER — AMLODIPINE BESYLATE 5 MG PO TABS
5.0000 mg | ORAL_TABLET | Freq: Every day | ORAL | Status: DC
  Administered 2012-10-11 – 2012-10-12 (×2): 5 mg via ORAL
  Filled 2012-10-11 (×2): qty 1

## 2012-10-11 MED ORDER — IRBESARTAN 300 MG PO TABS
300.0000 mg | ORAL_TABLET | Freq: Every day | ORAL | Status: DC
  Administered 2012-10-13: 300 mg via ORAL
  Filled 2012-10-11: qty 1

## 2012-10-11 MED ORDER — IRBESARTAN 300 MG PO TABS
300.0000 mg | ORAL_TABLET | Freq: Every day | ORAL | Status: DC
  Administered 2012-10-11: 300 mg via ORAL
  Filled 2012-10-11: qty 1

## 2012-10-11 MED ORDER — ASPIRIN EC 81 MG PO TBEC
81.0000 mg | DELAYED_RELEASE_TABLET | Freq: Every day | ORAL | Status: DC
  Administered 2012-10-11 – 2012-10-13 (×3): 81 mg via ORAL
  Filled 2012-10-11 (×3): qty 1

## 2012-10-11 MED ORDER — CLOPIDOGREL BISULFATE 75 MG PO TABS
75.0000 mg | ORAL_TABLET | Freq: Every day | ORAL | Status: DC
  Administered 2012-10-11: 75 mg via ORAL
  Filled 2012-10-11: qty 1

## 2012-10-11 MED ORDER — APIXABAN 5 MG PO TABS
5.0000 mg | ORAL_TABLET | Freq: Two times a day (BID) | ORAL | Status: DC
Start: 2012-10-12 — End: 2012-10-13
  Administered 2012-10-12 – 2012-10-13 (×2): 5 mg via ORAL
  Filled 2012-10-11 (×3): qty 1

## 2012-10-11 MED ORDER — ACETAMINOPHEN 325 MG PO TABS
650.0000 mg | ORAL_TABLET | Freq: Four times a day (QID) | ORAL | Status: DC | PRN
Start: 2012-10-11 — End: 2012-10-13

## 2012-10-11 NOTE — Consult Note (Signed)
Reason for Consult: Chest pain  Requesting Physician: [facility]  HPI: This is a 79 y.o. female with a past medical history significant for a history of CAD. She had CABG X 4 in 3/06. She had a DES placed to her PDA in July 2012, (RIMA-PDA occluded). This was patent on follow up cath in Oct 2012. She was seen in the office 07/10/12 and noted to have a long 1st degree AVB on her EKG,her PR was > 4. She was asymptomatic. She has a history of PAF since her CABG in 2006 and had been on Toprol, Amiodarone, ASA, and Plavix. At that time we stopped her Toprol and decreased her Amiodarone. She was seen in follow up 07/29/12. Her PR remained prolonged. This was reviewed with Dr Royann Shivers in the office. We stopped her Amiodarone at that time. She was seen in follow up 10/06/12 and noted to be in AF with VR of 58. She was asymptomatic. At that time we stopped her Plavix and added Eloquis. She is admitted now with Lt sided chest "pressure". She has some atypical features. She says her chest is also "sore". She complains of Lt arm soreness as well, worse with movement. She denies any SOB or diaphoresis. He symptoms last about "7 minutes" and resolve spontaneously.     PMHx:  Past Medical History  Diagnosis Date  . Hypertension   . Hx of CABG 2006    X4  . MI, old     "mild"  . Hyperlipidemia   . Presence of stent in right coronary artery 07/12    RIMA-PDA occluded, DES stent placed  . PAF (paroxysmal atrial fibrillation)     on AMIO  . Syncope 07/12    bradycardic, beta blocker decreased  . Chronic kidney disease, stage 3   . Hypothyroid   . Coronary artery disease    Past Surgical History  Procedure Laterality Date  . Carotid stent      2012  . Cardiac catheterization  [id number]    dr. Onalee Hua harding, revealing a atretic bypass to her right with patent distal RCA stent  . Doppler echocardiography  [id number]    mild asymmetric left ventricular hypertrophy, left ventricular systolic function is low normal,  ejection fraction = 50-55%, the LA is moderate dilated, the RA is mildly dilated, no significant valvular disease  . Nm myoview ltd  100510    post stress left ventricle is normal in size, post stress ejection fraction is 67% global left ventricular systolic function is normal, normal myocardial perfusion study, abnormal myocardial perfusion study, low risk scan    FAMHx: History reviewed. No pertinent family history.  SOCHx:  reports that she has never smoked. Her smokeless tobacco use includes Snuff. She reports that she does not drink alcohol or use illicit drugs.  ALLERGIES: Allergies  Allergen Reactions  . Penicillins Hives  . Sulfa Antibiotics Swelling    ROS: Pertinent items are noted in HPI. See H&P from 8/17 for complete details. She denies any syncope or near syncope  HOME MEDICATIONS: Prescriptions prior to admission  Medication Sig Dispense Refill  . acetaminophen (TYLENOL) 325 MG tablet Take 650 mg by mouth every 4 (four) hours as needed. For pain      . alum & mag hydroxide-simeth (MAALOX/MYLANTA) 200-200-20 MG/5ML suspension Take 15 mLs by mouth every 2 (two) hours as needed.      [name] amLODipine (NORVASC) 5 MG tablet Take 5 mg by mouth daily.      [name] apixaban (  ELIQUIS) 2.5 MG TABS tablet Take 1 tablet (2.5 mg total) by mouth 2 (two) times daily.  60 tablet  5  . aspirin 325 MG tablet Take 325 mg by mouth daily.      [name] atorvastatin (LIPITOR) 40 MG tablet Take 40 mg by mouth daily.      . clopidogrel (PLAVIX) 75 MG tablet Take 75 mg by mouth daily.      . furosemide (LASIX) 40 MG tablet Take 40 mg by mouth daily as needed. For fluid retention      . levothyroxine (SYNTHROID, LEVOTHROID) 50 MCG tablet Take 50 mcg by mouth daily.      . nitroGLYCERIN (NITROSTAT) 0.4 MG SL tablet Place 0.4 mg under the tongue every 5 (five) minutes as needed. For chest pain      . pantoprazole (PROTONIX) 40 MG tablet Take 40 mg by mouth daily.      . traMADol (ULTRAM) 50 MG tablet Take 50 mg  by mouth as needed.      . valsartan (DIOVAN) 320 MG tablet Take 320 mg by mouth daily.        HOSPITAL MEDICATIONS: I have reviewed the patient's current medications.  VITALS: Blood pressure 138/53, pulse 80, temperature 97.9 F (36.6 C), temperature source Oral, resp. rate 18, height 5\' 4"  (1.626 m), weight 158 lb 8 oz (71.895 kg), SpO2 98.00%.  PHYSICAL EXAM: General appearance: alert, cooperative, no distress and looks younger than her stated age Neck: no carotid bruit and no JVD Lungs: clear to auscultation bilaterally Heart: regular rate and rhythm Abdomen: soft, non-tender; bowel sounds normal; no masses,  no organomegaly Extremities: extremities normal, atraumatic, no cyanosis or edema Pulses: 2+ and symmetric Skin: Skin color, texture, turgor normal. No rashes or lesions Neurologic: Grossly normal  LABS: Results for orders placed during the hospital encounter of 10/10/12 (from the past 48 hour(s))  CBC     Status: None   Collection Time    10/10/12  8:37 PM      Result Value Range   WBC 5.0  4.0 - 10.5 K/uL   RBC 4.48  3.87 - 5.11 MIL/uL   Hemoglobin 12.8  12.0 - 15.0 g/dL   HCT 19.1  47.8 - 29.5 %   MCV 83.7  78.0 - 100.0 fL   MCH 28.6  26.0 - 34.0 pg   MCHC 34.1  30.0 - 36.0 g/dL   RDW 62.1  30.8 - 65.7 %   Platelets 199  150 - 400 K/uL  BASIC METABOLIC PANEL     Status: Abnormal   Collection Time    10/10/12  8:37 PM      Result Value Range   Sodium 139  135 - 145 mEq/L   Potassium 4.1  3.5 - 5.1 mEq/L   Chloride 105  96 - 112 mEq/L   CO2 23  19 - 32 mEq/L   Glucose, Bld 105 (*) 70 - 99 mg/dL   BUN 23  6 - 23 mg/dL   Creatinine, Ser 8.46  0.50 - 1.10 mg/dL   Calcium 9.8  8.4 - 96.2 mg/dL   GFR calc non Af Amer 46 (*) >90 mL/min   GFR calc Af Amer 54 (*) >90 mL/min   Comment: (NOTE)     The eGFR has been calculated using the CKD EPI equation.     This calculation has not been validated in all clinical situations.     eGFR's persistently <90 mL/min  signify possible Chronic Kidney  Disease.  POCT I-STAT TROPONIN I     Status: None   Collection Time    10/10/12  8:43 PM      Result Value Range   Troponin i, poc 0.07  0.00 - 0.08 ng/mL   Comment 3            Comment: Due to the release kinetics of cTnI,     a negative result within the first hours     of the onset of symptoms does not rule out     myocardial infarction with certainty.     If myocardial infarction is still suspected,     repeat the test at appropriate intervals.  POCT I-STAT TROPONIN I     Status: None   Collection Time    10/11/12 12:07 AM      Result Value Range   Troponin i, poc 0.03  0.00 - 0.08 ng/mL   Comment 3            Comment: Due to the release kinetics of cTnI,     a negative result within the first hours     of the onset of symptoms does not rule out     myocardial infarction with certainty.     If myocardial infarction is still suspected,     repeat the test at appropriate intervals.  TROPONIN I     Status: None   Collection Time    10/11/12 12:11 AM      Result Value Range   Troponin I <0.30  <0.30 ng/mL   Comment:            Due to the release kinetics of cTnI,     a negative result within the first hours     of the onset of symptoms does not rule out     myocardial infarction with certainty.     If myocardial infarction is still suspected,     repeat the test at appropriate intervals.  BASIC METABOLIC PANEL     Status: Abnormal   Collection Time    10/11/12  4:50 AM      Result Value Range   Sodium 142  135 - 145 mEq/L   Potassium 3.8  3.5 - 5.1 mEq/L   Chloride 111  96 - 112 mEq/L   CO2 24  19 - 32 mEq/L   Glucose, Bld 104 (*) 70 - 99 mg/dL   BUN 20  6 - 23 mg/dL   Creatinine, Ser 1.61 (*) 0.50 - 1.10 mg/dL   Calcium 9.8  8.4 - 09.6 mg/dL   GFR calc non Af Amer 43 (*) >90 mL/min   GFR calc Af Amer 50 (*) >90 mL/min   Comment: (NOTE)     The eGFR has been calculated using the CKD EPI equation.     This calculation has not  been validated in all clinical situations.     eGFR's persistently <90 mL/min signify possible Chronic Kidney     Disease.  CBC     Status: Abnormal   Collection Time    10/11/12  4:50 AM      Result Value Range   WBC 6.0  4.0 - 10.5 K/uL   RBC 4.30  3.87 - 5.11 MIL/uL   Hemoglobin 11.6 (*) 12.0 - 15.0 g/dL   HCT 04.5  40.9 - 81.1 %   MCV 84.9  78.0 - 100.0 fL   MCH 27.0  26.0 - 34.0 pg   MCHC 31.8  30.0 - 36.0 g/dL   RDW 11.9  14.7 - 82.9 %   Platelets 182  150 - 400 K/uL  TROPONIN I     Status: None   Collection Time    10/11/12  7:41 AM      Result Value Range   Troponin I <0.30  <0.30 ng/mL   Comment:            Due to the release kinetics of cTnI,     a negative result within the first hours     of the onset of symptoms does not rule out     myocardial infarction with certainty.     If myocardial infarction is still suspected,     repeat the test at appropriate intervals.    EKG: NSR/SB with long 1st degree AVB  IMAGING: Dg Chest Port 1 View  10/10/2012   *RADIOLOGY REPORT*  Clinical Data: 79 year old female shortness of breath and chest pain.  PORTABLE CHEST - 1 VIEW  Comparison: 01/01/2012 and earlier.  Findings: Portable upright AP view 2252 hours.  Mildly lower lung volumes. Stable cardiomegaly and mediastinal contours.  Sequelae of CABG.  Allowing for portable technique, the lungs are clear.  IMPRESSION: No acute cardiopulmonary abnormality.   Original Report Authenticated By: Erskine Speed, M.D.    IMPRESSION: Principal Problem:   Chest pain with moderate risk for cardiac etiology Active Problems:   Hypertension   Hx of CABG X 4 3/06-    RCA DES placed 7/12- patent 10/12   PAF - recurrent   AVB - beta blocker and Amiodarone stopped   Chronic anticoagulation- Eloquis started and Plavix stopped 10/07/12   First degree atrioventricular block   Hyperlipidemia   Chronic kidney disease, stage 3   RECOMMENDATION: Dr Royann Shivers to see- ? Myoview vs Cath  Time  Spent Directly with Patient: 40 minutes  Abelino Derrick [phone] beeper 10/11/2012, 9:54 AM   I have seen and examined the patient along with Corine Shelter, PA.  I have reviewed the chart, notes and new data.  I agree with PA's note.  Key new complaints: her chest pain is not typical, but it is reminiscent of pre-CABG angina and responded to NTG. Pain free once NTG patch was applied.  Key examination changes: bradycardia, otherwise unremarkable cardiac exam. Normal distal pulses. Key new findings / data: ECG - Sinus brady with very long 1st degree AV block, nonspecific repol changes. ST changes are chronically present, but look subtly different from last tracing. cTropI negative so far. Creat 1.12.  PLAN:recommend cardiac cath/coronary angio as symptoms appear consistent with unstable angina. This procedure has been fully reviewed with the patient and informed consent has been obtained. Hold nephrotoxic drugs.   Thurmon Fair, MD, [facility] [phone] 10/11/2012, 12:53 PM

## 2012-10-11 NOTE — ED Notes (Signed)
Jenkins, MD at bedside.  

## 2012-10-11 NOTE — H&P (Signed)
[facility] History and Physical  Stephanie Atkinson ZOX:[id number] DOB: [birth year] DOA: 10/10/2012  Referring physician: EDP PCP: Thayer Headings, MD  Specialists:   Chief Complaint: Chest Pain  HPI: Stephanie Atkinson is a 79 y.o. female with a history of CAD S/P CABG x4, and PTI with Stent X1 who presents to the ED with complaints of pain in the left breast radiating into her back which has been occuring intermittently for the past 3 days. She reports having SOB, and diaphoresis at times with the pain.  She rates the pain as a 6/10, and the pain is pressure-like.  She reports that her previous MI pain was similar.  She sees [facility] and she was evaluated in the Ed and was found to have a non-acute EKG and negative first troponin so she was referred for medical admission.      Review of Systems: The patient denies anorexia, fever, chills, headaches, weight loss, vision loss, diplopia, dizziness, decreased hearing, rhinitis, hoarseness, syncope, dyspnea on exertion, peripheral edema, balance deficits, cough, hemoptysis, abdominal pain, nausea, vomiting, diarrhea, constipation, hematemesis, melena, hematochezia, severe indigestion/heartburn, dysuria, hematuria, incontinence, muscle weakness, suspicious skin lesions, transient blindness, difficulty walking, depression, unusual weight change, abnormal bleeding, enlarged lymph nodes, angioedema, and breast masses.    Past Medical History  Diagnosis Date  . Hypertension   . Hx of CABG 2006    X4  . MI, old     "mild"  . Hyperlipidemia   . Presence of stent in right coronary artery 07/12    RIMA-PDA occluded, DES stent placed  . PAF (paroxysmal atrial fibrillation)     on AMIO  . Syncope 07/12    bradycardic, beta blocker decreased  . Chronic kidney disease, stage 3   . Hypothyroid   . Coronary artery disease     Past Surgical History  Procedure Laterality Date  . Carotid stent      2012  . Cardiac catheterization  [id number]    dr.  Onalee Hua harding, revealing a atretic bypass to her right with patent distal RCA stent  . Doppler echocardiography  [id number]    mild asymmetric left ventricular hypertrophy, left ventricular systolic function is low normal, ejection fraction = 50-55%, the LA is moderate dilated, the RA is mildly dilated, no significant valvular disease  . Nm myoview ltd  [id number]    post stress left ventricle is normal in size, post stress ejection fraction is 67% global left ventricular systolic function is normal, normal myocardial perfusion study, abnormal myocardial perfusion study, low risk scan    Prior to Admission medications   Medication Sig Start Date End Date Taking? Authorizing Provider  acetaminophen (TYLENOL) 325 MG tablet Take 650 mg by mouth every 4 (four) hours as needed. For pain   Yes Historical Provider, MD  alum & mag hydroxide-simeth (MAALOX/MYLANTA) 200-200-20 MG/5ML suspension Take 15 mLs by mouth every 2 (two) hours as needed.   Yes Historical Provider, MD  amLODipine (NORVASC) 5 MG tablet Take 5 mg by mouth daily. 06/22/12  Yes Historical Provider, MD  apixaban (ELIQUIS) 2.5 MG TABS tablet Take 1 tablet (2.5 mg total) by mouth 2 (two) times daily. 10/06/12  Yes Abelino Derrick, PA-C  aspirin 325 MG tablet Take 325 mg by mouth daily.   Yes Historical Provider, MD  atorvastatin (LIPITOR) 40 MG tablet Take 40 mg by mouth daily.   Yes Historical Provider, MD  clopidogrel (PLAVIX) 75 MG tablet Take 75 mg by mouth daily.   Yes Historical  Provider, MD  furosemide (LASIX) 40 MG tablet Take 40 mg by mouth daily as needed. For fluid retention   Yes Historical Provider, MD  levothyroxine (SYNTHROID, LEVOTHROID) 50 MCG tablet Take 50 mcg by mouth daily.   Yes Historical Provider, MD  nitroGLYCERIN (NITROSTAT) 0.4 MG SL tablet Place 0.4 mg under the tongue every 5 (five) minutes as needed. For chest pain   Yes Historical Provider, MD  pantoprazole (PROTONIX) 40 MG tablet Take 40 mg by mouth daily.   Yes  Historical Provider, MD  traMADol (ULTRAM) 50 MG tablet Take 50 mg by mouth as needed. 06/22/12  Yes Historical Provider, MD  valsartan (DIOVAN) 320 MG tablet Take 320 mg by mouth daily.   Yes Historical Provider, MD    Allergies  Allergen Reactions  . Penicillins Hives  . Sulfa Antibiotics Swelling    Social History:  reports that she has never smoked. Her smokeless tobacco use includes Snuff. She reports that she does not drink alcohol or use illicit drugs.     Family History:    CAD in Mother and Maternal Grandmother   Physical Exam:  GEN:  Pleasant Elderly well nourished and well developed 79 y.o. African American female examined  and in no acute distress; cooperative with exam Filed Vitals:   10/10/12 2215 10/10/12 2317 10/11/12 0016 10/11/12 0138  BP: 145/52 149/57 135/52 164/61  Pulse: 66 66 63 73  Temp:    98 F (36.7 C)  TempSrc:      Resp: 19 12 18 20   Height:    5\' 4"  (1.626 m)  Weight:    71.895 kg (158 lb 8 oz)  SpO2: 99% 99% 96% 100%   Blood pressure 164/61, pulse 73, temperature 98 F (36.7 C), temperature source Oral, resp. rate 20, height 5\' 4"  (1.626 m), weight 71.895 kg (158 lb 8 oz), SpO2 100.00%. PSYCH: She is alert and oriented x4; does not appear anxious does not appear depressed; affect is normal HEENT: Normocephalic and Atraumatic, Mucous membranes pink; PERRLA; EOM intact; Fundi:  Benign;  No scleral icterus, Nares: Patent, Oropharynx: Clear, Fair Dentition, Neck:  FROM, no cervical lymphadenopathy nor thyromegaly or carotid bruit; no JVD; Breasts:: Not examined CHEST WALL: No tenderness CHEST: Normal respiration, clear to auscultation bilaterally HEART: Regular rate and rhythm; no murmurs rubs or gallops BACK: No kyphosis or scoliosis; no CVA tenderness ABDOMEN: Positive Bowel Sounds,  soft non-tender; no masses, no organomegaly. Rectal Exam: Not done EXTREMITIES: No cyanosis, clubbing or edema; no ulcerations. Small area of ecchymosis on Right  mid Arm area.    Genitalia: not examined PULSES: 2+ and symmetric SKIN: Normal hydration no rash or ulceration CNS: Cranial nerves 2-12 grossly intact no focal neurologic deficit    Labs on Admission:  Basic Metabolic Panel:  Recent Labs Lab 10/10/12 2037  NA 139  K 4.1  CL 105  CO2 23  GLUCOSE 105*  BUN 23  CREATININE 1.06  CALCIUM 9.8   Liver Function Tests: No results found for this basename: AST, ALT, ALKPHOS, BILITOT, PROT, ALBUMIN,  in the last 168 hours No results found for this basename: LIPASE, AMYLASE,  in the last 168 hours No results found for this basename: AMMONIA,  in the last 168 hours CBC:  Recent Labs Lab 10/10/12 2037  WBC 5.0  HGB 12.8  HCT 37.5  MCV 83.7  PLT 199   Cardiac Enzymes:  Recent Labs Lab 10/11/12 0011  TROPONINI <0.30    BNP (last 3 results) No  results found for this basename: PROBNP,  in the last 8760 hours CBG: No results found for this basename: GLUCAP,  in the last 168 hours  Radiological Exams on Admission: Dg Chest Port 1 View  10/10/2012   *RADIOLOGY REPORT*  Clinical Data: 79 year old female shortness of breath and chest pain.  PORTABLE CHEST - 1 VIEW  Comparison: 01/01/2012 and earlier.  Findings: Portable upright AP view 2252 hours.  Mildly lower lung volumes. Stable cardiomegaly and mediastinal contours.  Sequelae of CABG.  Allowing for portable technique, the lungs are clear.  IMPRESSION: No acute cardiopulmonary abnormality.   Original Report Authenticated By: Erskine Speed, M.D.     EKG: Independently reviewed. No acute changes form previous,  Rate 71,    Assessment/Plan Principal Problem:   Atypical chest pain Active Problems:   Hypertension   Hx of CABG X 4 3/06- PDA DES 7/12, this was patent 10/12   Hyperlipidemia   PAF - recurrent   Chronic kidney disease, stage 3   Chronic anticoagulation- Eloquis started and Plavix stopped 10/07/12   Hypothyroid     1.   Atypical Chest Pain-  Admitted to  telemetry Bed for Chest Pain rule out.  Nitropaste, O2,and Continue Eliquis Rx.     2.   HTN-  Continue Amlodipine and Diovan and Lasix.   Monitor Bps.    3.   CAD-  Continue on  Eliquis, ASA , Plavix , and Statin Rx (Atorvastatin).     4.   PAF- continue on Eliquis Rx.    5.  CKD stage 3- Monitor BUN/Cr levels.    6.   Hyperlipidemia- continue Statin Rx.    7.  Hypothyroid-  Continue levothyroxine.  Check TSH level.    8.  Notify [facility] in AM.      Code Status:    FULL CODE Family Communication:   Daughter At Bedside  Disposition Plan:      Return to Home on discharge   Time spent:   90 Minutes  Ron Parker [facility] Pager [phone]  If 7PM-7AM, please contact night-coverage [email or web address] Password [facility] 10/11/2012, 2:19 AM

## 2012-10-11 NOTE — Progress Notes (Signed)
Utilization review completed.  

## 2012-10-11 NOTE — Progress Notes (Signed)
PATIENT DETAILS Name: Stephanie Atkinson Age: 79 y.o. Sex: female Date of Birth: June 17, 1924 Admit Date: 10/10/2012 Admitting Physician Ron Parker, MD WUJ:[email or web address],NFAOZ, MD  Subjective: No further chest pain  Assessment/Plan: Principal Problem:   Chest pain  -known CAD-pain with both typical and atypical features -troponins neg, c/w ASA -cards consulted-left heart Cath in am  Active Problems:   Hypertension -controlled with Amlodipine, Lasix and Avapro    Hx of CABG X 4 3/06-  -on ASA, Statins    Hyperlipidemia -c/w Statins    PAF - recurrent -previously on Amiodarone and Toprol-these have been discontinued 2/2 prolonged PR interval -on Eliquis for anticoagulation    AVB  - beta blocker and Amiodarone stopped  Hypothyroidism -Levothyroxine  Disposition: Remain inpatient  DVT Prophylaxis: Not needed as on Eliquis  Code Status: Full code   Family Communication None  Procedures:  None  CONSULTS:  cardiology   MEDICATIONS: Scheduled Meds: . amLODipine  5 mg Oral Daily  . [START ON 10/12/2012] apixaban  5 mg Oral BID  . aspirin EC  81 mg Oral Daily  . atorvastatin  40 mg Oral Daily  . [START ON 10/13/2012] furosemide  40 mg Oral Daily  . [START ON 10/13/2012] irbesartan  300 mg Oral Daily  . levothyroxine  50 mcg Oral QAC breakfast  . pantoprazole  40 mg Oral Daily   Continuous Infusions: . sodium chloride 10 mL/hr at 10/11/12 0041   PRN Meds:.acetaminophen, acetaminophen, alum & mag hydroxide-simeth, HYDROmorphone (DILAUDID) injection, nitroGLYCERIN, ondansetron (ZOFRAN) IV, ondansetron, oxyCODONE, traMADol, zolpidem  Antibiotics: Anti-infectives   None       PHYSICAL EXAM: Vital signs in last 24 hours: Filed Vitals:   10/11/12 0138 10/11/12 0500 10/11/12 0900 10/11/12 1323  BP: 164/61 189/57 138/53 149/56  Pulse: 73 75 80 76  Temp: 98 F (36.7 C) 98.1 F (36.7 C) 97.9 F (36.6 C) 98.2 F (36.8 C)  TempSrc:   Oral Oral  Resp:  20 20 18 20   Height: 5\' 4"  (1.626 m)     Weight: 71.895 kg (158 lb 8 oz)     SpO2: 100% 99% 98% 100%    Weight change:  Filed Weights   10/11/12 0138  Weight: 71.895 kg (158 lb 8 oz)   Body mass index is 27.19 kg/(m^2).   Gen Exam: Awake and alert with clear speech.   Neck: Supple, No JVD.   Chest: B/L Clear.   CVS: S1 S2 Regular, no murmurs.  Abdomen: soft, BS +, non tender, non distended.  Extremities: no edema, lower extremities warm to touch. Neurologic: Non Focal.   Skin: No Rash.   Wounds: N/A.    Intake/Output from previous day:  Intake/Output Summary (Last 24 hours) at 10/11/12 1500 Last data filed at 10/11/12 1300  Gross per 24 hour  Intake    600 ml  Output    225 ml  Net    375 ml     LAB RESULTS: CBC  Recent Labs Lab 10/10/12 2037 10/11/12 0450  WBC 5.0 6.0  HGB 12.8 11.6*  HCT 37.5 36.5  PLT 199 182  MCV 83.7 84.9  MCH 28.6 27.0  MCHC 34.1 31.8  RDW 13.1 13.3    Chemistries   Recent Labs Lab 10/10/12 2037 10/11/12 0450  NA 139 142  K 4.1 3.8  CL 105 111  CO2 23 24  GLUCOSE 105* 104*  BUN 23 20  CREATININE 1.06 1.12*  CALCIUM 9.8 9.8    CBG: No  results found for this basename: GLUCAP,  in the last 168 hours  GFR Estimated Creatinine Clearance: 35.1 ml/min (by C-G formula based on Cr of 1.12).  Coagulation profile No results found for this basename: INR, PROTIME,  in the last 168 hours  Cardiac Enzymes  Recent Labs Lab 10/11/12 0011 10/11/12 0741 10/11/12 1148  TROPONINI <0.30 <0.30 <0.30    No components found with this basename: POCBNP,  No results found for this basename: DDIMER,  in the last 72 hours No results found for this basename: HGBA1C,  in the last 72 hours No results found for this basename: CHOL, HDL, LDLCALC, TRIG, CHOLHDL, LDLDIRECT,  in the last 72 hours No results found for this basename: TSH, T4TOTAL, FREET3, T3FREE, THYROIDAB,  in the last 72 hours No results found for this basename: VITAMINB12,  FOLATE, FERRITIN, TIBC, IRON, RETICCTPCT,  in the last 72 hours No results found for this basename: LIPASE, AMYLASE,  in the last 72 hours  Urine Studies No results found for this basename: UACOL, UAPR, USPG, UPH, UTP, UGL, UKET, UBIL, UHGB, UNIT, UROB, ULEU, UEPI, UWBC, URBC, UBAC, CAST, CRYS, UCOM, BILUA,  in the last 72 hours  MICROBIOLOGY: No results found for this or any previous visit (from the past 240 hour(s)).  RADIOLOGY STUDIES/RESULTS: Dg Chest Port 1 View  10/10/2012   *RADIOLOGY REPORT*  Clinical Data: 79 year old female shortness of breath and chest pain.  PORTABLE CHEST - 1 VIEW  Comparison: 01/01/2012 and earlier.  Findings: Portable upright AP view 2252 hours.  Mildly lower lung volumes. Stable cardiomegaly and mediastinal contours.  Sequelae of CABG.  Allowing for portable technique, the lungs are clear.  IMPRESSION: No acute cardiopulmonary abnormality.   Original Report Authenticated By: Erskine Speed, M.D.    Jeoffrey Massed, MD  [facility] Pager:[phone]  If 7PM-7AM, please contact night-coverage [email or web address] Password [email or web address] 10/11/2012, 3:00 PM   LOS: 1 day

## 2012-10-12 ENCOUNTER — Encounter (HOSPITAL_COMMUNITY)
Admission: EM | Disposition: A | Discharge status: Home / Self Care | Payer: Self-pay | Source: Home / Self Care | Attending: Internal Medicine

## 2012-10-12 DIAGNOSIS — I2510 Atherosclerotic heart disease of native coronary artery without angina pectoris: Principal | ICD-10-CM

## 2012-10-12 HISTORY — PX: LEFT HEART CATHETERIZATION WITH CORONARY/GRAFT ANGIOGRAM: SHX5450

## 2012-10-12 LAB — CBC
HCT: 39 % (ref 36.0–46.0)
Hemoglobin: 12.9 g/dL (ref 12.0–15.0)
MCH: 27.6 pg (ref 26.0–34.0)
MCHC: 33.1 g/dL (ref 30.0–36.0)
MCV: 83.3 fL (ref 78.0–100.0)
Platelets: 192 10*3/uL (ref 150–400)
RBC: 4.68 MIL/uL (ref 3.87–5.11)
RDW: 13.1 % (ref 11.5–15.5)
WBC: 4.6 10*3/uL (ref 4.0–10.5)

## 2012-10-12 LAB — BASIC METABOLIC PANEL
BUN: 19 mg/dL (ref 6–23)
CO2: 26 mEq/L (ref 19–32)
Calcium: 9.8 mg/dL (ref 8.4–10.5)
Chloride: 111 mEq/L (ref 96–112)
Creatinine, Ser: 1.15 mg/dL — ABNORMAL HIGH (ref 0.50–1.10)
GFR calc Af Amer: 48 mL/min — ABNORMAL LOW (ref >90)
GFR calc non Af Amer: 42 mL/min — ABNORMAL LOW (ref >90)
Glucose, Bld: 90 mg/dL (ref 70–99)
Potassium: 4 mEq/L (ref 3.5–5.1)
Sodium: 144 mEq/L (ref 135–145)

## 2012-10-12 LAB — PROTIME-INR
INR: 1.24 (ref 0.00–1.49)
Prothrombin Time: 15.3 seconds — ABNORMAL HIGH (ref 11.6–15.2)

## 2012-10-12 SURGERY — LEFT HEART CATHETERIZATION WITH CORONARY/GRAFT ANGIOGRAM
Anesthesia: LOCAL

## 2012-10-12 MED ORDER — FENTANYL CITRATE 0.05 MG/ML IJ SOLN
INTRAMUSCULAR | Status: AC
  Filled 2012-10-12: qty 2

## 2012-10-12 MED ORDER — NITROGLYCERIN 0.2 MG/ML ON CALL CATH LAB
INTRAVENOUS | Status: AC
  Filled 2012-10-12: qty 1

## 2012-10-12 MED ORDER — LIDOCAINE HCL (PF) 1 % IJ SOLN
INTRAMUSCULAR | Status: AC
  Filled 2012-10-12: qty 30

## 2012-10-12 MED ORDER — HYDRALAZINE HCL 20 MG/ML IJ SOLN: 10.0000 mg | INTRAMUSCULAR | Status: DC | PRN

## 2012-10-12 MED ORDER — SODIUM CHLORIDE 0.9 % IJ SOLN
3.0000 mL | Freq: Two times a day (BID) | INTRAMUSCULAR | Status: DC
  Administered 2012-10-12: 3 mL via INTRAVENOUS

## 2012-10-12 MED ORDER — HEPARIN (PORCINE) IN NACL 2-0.9 UNIT/ML-% IJ SOLN
INTRAMUSCULAR | Status: AC
  Filled 2012-10-12: qty 1000

## 2012-10-12 MED ORDER — SODIUM CHLORIDE 0.9 % IV SOLN: 250.0000 mL | INTRAVENOUS | Status: DC | PRN

## 2012-10-12 MED ORDER — AMLODIPINE BESYLATE 10 MG PO TABS
10.0000 mg | ORAL_TABLET | Freq: Every day | ORAL | Status: DC
  Administered 2012-10-13: 10 mg via ORAL
  Filled 2012-10-12: qty 1

## 2012-10-12 MED ORDER — SODIUM CHLORIDE 0.9 % IJ SOLN: 3.0000 mL | INTRAMUSCULAR | Status: DC | PRN

## 2012-10-12 MED ORDER — SODIUM CHLORIDE 0.9 % IV SOLN: 1.0000 mL/kg/h | INTRAVENOUS | Status: AC

## 2012-10-12 MED ORDER — MIDAZOLAM HCL 2 MG/2ML IJ SOLN
INTRAMUSCULAR | Status: AC
  Filled 2012-10-12: qty 2

## 2012-10-12 NOTE — Progress Notes (Signed)
Subjective:  No chest pain overnight  Objective:  Vital Signs in the last 24 hours: Temp:  [97.9 F (36.6 C)-98.5 F (36.9 C)] 98.1 F (36.7 C) (08/18 0759) Pulse Rate:  [63-92] 63 (08/18 0759) Resp:  [16-20] 18 (08/18 0400) BP: (127-149)/(37-56) 137/47 mmHg (08/18 0759) SpO2:  [98 %-100 %] 99 % (08/18 0759)  Intake/Output from previous day:  Intake/Output Summary (Last 24 hours) at 10/12/12 0817 Last data filed at 10/11/12 1800  Gross per 24 hour  Intake    960 ml  Output      0 ml  Net    960 ml    Physical Exam: General appearance: alert, cooperative and no distress Lungs: clear to auscultation bilaterally Heart: regular rate and rhythm   Rate: 66  Rhythm: normal sinus rhythm 1st degree AVB  Lab Results:  Recent Labs  10/11/12 0450 10/12/12 0612  WBC 6.0 4.6  HGB 11.6* 12.9  PLT 182 192    Recent Labs  10/11/12 0450 10/12/12 0612  NA 142 144  K 3.8 4.0  CL 111 111  CO2 24 26  GLUCOSE 104* 90  BUN 20 19  CREATININE 1.12* 1.15*    Recent Labs  10/11/12 0741 10/11/12 1148  TROPONINI <0.30 <0.30   Hepatic Function Panel No results found for this basename: PROT, ALBUMIN, AST, ALT, ALKPHOS, BILITOT, BILIDIR, IBILI,  in the last 72 hours No results found for this basename: CHOL,  in the last 72 hours  Recent Labs  10/12/12 0612  INR 1.24    Imaging: Imaging results have been reviewed  Cardiac Studies:  Assessment/Plan:   Principal Problem:   Chest pain with moderate risk for cardiac etiology Active Problems:   Hypertension   Hx of CABG X 4 3/06-    RCA DES placed 7/12- patent 10/12   PAF - recurrent   AVB - beta blocker and Amiodarone stopped   Chronic anticoagulation- Eloquis started and Plavix stopped 10/07/12   First degree atrioventricular block   Hyperlipidemia   Chronic kidney disease, stage 3   PLAN: Cath today. We will take on our service.  [name] PA-C Beeper [phone] 10/12/2012, 8:17 AM  79 y/o woman with known  CAD - CABG & PCI on Sequential RadA-OM1-OM2 Graft p/w CP concerning for Angina. Per Dr. Croitoru's initial plan, will proceed with cardiac cath today - was not dosed with Eliquis since admission, should be all but cleared by the time of cath. Cr seems to be stable.  This procedure has been fully reviewed with the patient and written informed consent has been obtained.   Dontea Corlew W, M.D., M.S. [facility] [address] [place], [place]  [address]  [phone] Pager # [phone] 10/12/2012 9:04 AM     

## 2012-10-12 NOTE — CV Procedure (Signed)
CARDIAC CATHETERIZATION REPORT  NAME:  Stephanie Atkinson   MRN: [id number] DOB:  08-25-1924   ADMIT DATE: 10/10/2012 Procedure Date: 10/12/2012  INTERVENTIONAL CARDIOLOGIST: Marykay Lex, M.D., MS PRIMARY CARE PROVIDER: Thayer Headings, MD PRIMARY CARDIOLOGIST: Nanetta Batty, M.D.  PATIENT:  Stephanie Atkinson is a 79 y.o. female with history of coronary disease status post all arterial CABG x4 in 2006 (LIMA-LAD, free RIMA-RCA, free radial-OM1-OM 2 sequential) who underwent distal RCA PCI in July of 2012 which is not have an occluded graft to the RCA and distal RCA and plus RPAV disease.  This was treated successfully with a drug-eluting stent in distal balloon angioplasty.  She is pre-cath in October of that year and have no significant change to her coronaries.  She done well up until this weekend when she had a prolonged episode of left-sided chest discomfort which was reminiscent of her pre-CABG days.  She was seen by Dr. Royann Shivers upon admission and was referred for cardiac catheterization for possible unstable angina.  PRE-OPERATIVE DIAGNOSIS:    Chest pain concerning for Unstable Angina  PROCEDURES PERFORMED:    LEFT HEART CATHETERIZATION WITH NATIVE CORONARY AND GRAFT ANGIOGRAPHY  ABDOMINAL AORTA-BILATERAL ILIAC ANGIOGRAPHY  PROCEDURE:Consent:  Risks of procedure as well as the alternatives and risks of each were explained to the (patient/caregiver).  Consent for procedure obtained. Consent for signed by MD and patient with RN witness -- placed on chart.   PROCEDURE: The patient was brought to the 2nd Floor [facility] in the fasting state and prepped and draped in the usual sterile fashion for Both groin  access. Sterile technique was used including antiseptics, cap, gloves, gown, hand hygiene, mask and sheet.  Skin prep: Chlorhexidine.  Time Out: Verified patient identification, verified procedure, site/side was marked, verified correct patient position,  special equipment/implants available, medications/allergies/relevent history reviewed, required imaging and test results available.  Performed  Access:   Right Common Femoral Artery; 5 Fr Sheath -- fluoroscopically guided modified Seldinger technique; however the wire would not advance into the common iliac ear therefore the sheath was initially placed into the external iliac.  Despite multiple adjustments, I was unable to aspirate from the side port of the sheath, therefore I decided to remove the sheath, and hold 15 minutes of manual pressure for hemostasis.  Attention was turned to the left groin that had been prepped.  Left Common Femoral Artery; 5 Fr Sheath -- fluoroscopically guided modified Seldinger technique    Diagnostic:  JR 4, IMA, JL4 and angled pigtail catheter  Left Coronary Artery Angiography: JL4  Right Coronary Artery, free radial graft to OM1 and OM 2 Angiography: JR 4, this catheter was then redirected into the left subclavian artery and exchanged the IMA catheter.  LIMA-LAD Angiography: IMA catheter  LV Hemodynamics (LV Gram): Angled pigtail catheter; pullback into the descending aorta for abdominal aorta-iliac angiography.  Left Common Femoral Artery Sheath:  To removed in the holding area with any pressure and held.  The right groin was unremarkable with no hematoma.  MEDICATIONS:  Anesthesia:  Local Lidocaine 16 mL right groin, 16 ml left groin  Sedation:  2 mg IV Versed, 50 mcg IV fentanyl ;   Omnipaque Contrast: 105 ml  Hemodynamics:  Central Aortic / Mean Pressures: 171/61 mmHg; 105 mmHg  Left Ventricular Pressures / EDP: 169/8 mmHg; 13 mmHg  Left Ventriculography:  EF: 65-70%  Wall Motion: Hyperdynamic  Coronary Anatomy:  Left Main: Large-caliber vessel that bifurcates into the LAD and Circumflex;  mild calcification but otherwise angiographically normal  LAD: Small to moderate caliber (roughly 2 mm) vessel it gives rise to a proximal first  diagonal branch and in the mid small diagonal branch/SP1.  There is 80% proximal lesion in a small small second diagonal branch, there is an occluded.  Non-this the LAD has competitive flow from the LIMA graft..  D1: Small-caliber (roughly 1.5 mm) vessel with ostial 80% stenosis  Left Circumflex: Very obtuse angle takeoff with a proximal OM1 before the vessel courses into the AV groove where it gives off an OM 2 then continues on to give off 2 small left posterolateral branches.  Prior to the bifurcation between OM 2 and the AV groove circumflex there is a focal 50-60% stenosis.  OM1: Small to moderate caliber (at most 2 mm) vessel with ostial 70% lesion; the remainder the vessel is free of significant disease.  OM 2: Small-caliber (roughly 1.27mm) vessel with proximal 60% stenosis   RCA: Moderate to large caliber, dominant vessel with a very proximal conus branch that has a 70% ostial stenosis.  Beyond this the vessel is essentially normal with minimal luminal irregularities and several smaller marginal branches.  Distally there is a widely patent stent prior to bifurcation into the Right Posterior Descending Artery and the Right Posterior AV Groove Branch (RPAV).  RPDA: Small-caliber vessel, mild luminal irregularities  RPL Sysytem:The RPAV is a smaller moderate caliber vessel with widely patent previous PTCA segment before the vessel bifurcates into 2 small caliber RPL branches.  Graft Angiography  LIMA-LAD: Widely patent moderate large-caliber vessel, follow on LAD is free of significant disease and gives rise to small diagonal branch as it reaches the apex.  Radial-OM1-OM 2: Very small, almost atretic graft with an ostial 50% lesion.  The anastomosis to OM1 does not appear to have any significant disease, however there is only competitive flow in the native OM1.  Distal flow beyond OM1, shows a widely patent, albeit very small caliber graft to the OM 2 that is perfused, but also has  competitive flow.  Known occlusion of free RIMA-RCA  PATIENT DISPOSITION:    The patient was transferred to the PACU holding area in a hemodynamicaly stable, chest pain free condition.  The patient tolerated the procedure well, and there were no complications.  EBL:   < 10 ml  The patient was stable before, during, and after the procedure.  POST-OPERATIVE DIAGNOSIS:    Moderate to severe native coronary artery disease in very small vessels.  No clear-cut culprit lesion for the patient's resting chest pain.  The radial graft to OM1-OM 2 does appear to be significantly atretic and small in caliber, however even the brisk flow in the native vessels, this is likely due to significant competitive flow.  It is unlikely that the existing lesion in OM1 would cause resting chest pain.  The previous PTCA and PCI site of the RCA is widely patent, LIMA is widely patent.  Systemic hypertension, but mildly elevated LVEDP.  Normal EF, with no regional wall motion abnormalities.  Essentially, despite the existence of disease in the circumflex system, these vessels are at best 2 mm in diameter, and the most significant lesion is the ostial OM1 lesion which would only be a PTCA candidate and not PCI due to the proximity to the parent Circumflex.  PLAN OF CARE:  Monitor over night with hydration due to her history of renal insufficiency.  Family medical therapy, given her hypertension, will increase amlodipine dose to 10 mg  daily.  Consider long-acting nitrate  Monitor over night, annulate the morning.  Would discharge if stable.  Restart Eliquis tonight.   Marykay Lex, M.D., M.S. [facility] [address] [place], [place]  [address]  [phone]  10/12/2012 3:47 PM

## 2012-10-12 NOTE — Interval H&P Note (Signed)
History and Physical Interval Note:  10/12/2012 10:47 AM  Stephanie Atkinson  has presented today for surgery, with the diagnosis of CP (likely Unstable Angina) The various methods of treatment have been discussed with the patient and family. After consideration of risks, benefits and other options for treatment, the patient has consented to  Procedure(s): LEFT HEART CATHETERIZATION WITH CORONARY/GRAFT ANGIOGRAM (N/A) and POSSIBLE PERCUTANEOUS CORONARY INTERVENTION as a surgical intervention .  The patient's history has been reviewed, patient examined, no change in status, stable for surgery.  I have reviewed the patient's chart and labs.  Questions were answered to the patient's satisfaction.     Cath Lab Visit (complete for each Cath Lab visit)  Clinical Evaluation Leading to the Procedure:   ACS: yes ; CP / ANGINA AT REST - UNSTABLE ANGINA  Non-ACS:    Anginal Classification: CCS III  Anti-ischemic medical therapy: Maximal Therapy (2 or more classes of medications)  Non-Invasive Test Results: No non-invasive testing performed  Prior CABG: Previous CABG  HARDING,DAVID W

## 2012-10-12 NOTE — H&P (View-Only) (Signed)
Subjective:  No chest pain overnight  Objective:  Vital Signs in the last 24 hours: Temp:  [97.9 F (36.6 C)-98.5 F (36.9 C)] 98.1 F (36.7 C) (08/18 0759) Pulse Rate:  [63-92] 63 (08/18 0759) Resp:  [16-20] 18 (08/18 0400) BP: (127-149)/(37-56) 137/47 mmHg (08/18 0759) SpO2:  [98 %-100 %] 99 % (08/18 0759)  Intake/Output from previous day:  Intake/Output Summary (Last 24 hours) at 10/12/12 0817 Last data filed at 10/11/12 1800  Gross per 24 hour  Intake    960 ml  Output      0 ml  Net    960 ml    Physical Exam: General appearance: alert, cooperative and no distress Lungs: clear to auscultation bilaterally Heart: regular rate and rhythm   Rate: 66  Rhythm: normal sinus rhythm 1st degree AVB  Lab Results:  Recent Labs  10/11/12 0450 10/12/12 0612  WBC 6.0 4.6  HGB 11.6* 12.9  PLT 182 192    Recent Labs  10/11/12 0450 10/12/12 0612  NA 142 144  K 3.8 4.0  CL 111 111  CO2 24 26  GLUCOSE 104* 90  BUN 20 19  CREATININE 1.12* 1.15*    Recent Labs  10/11/12 0741 10/11/12 1148  TROPONINI <0.30 <0.30   Hepatic Function Panel No results found for this basename: PROT, ALBUMIN, AST, ALT, ALKPHOS, BILITOT, BILIDIR, IBILI,  in the last 72 hours No results found for this basename: CHOL,  in the last 72 hours  Recent Labs  10/12/12 0612  INR 1.24    Imaging: Imaging results have been reviewed  Cardiac Studies:  Assessment/Plan:   Principal Problem:   Chest pain with moderate risk for cardiac etiology Active Problems:   Hypertension   Hx of CABG X 4 3/06-    RCA DES placed 7/12- patent 10/12   PAF - recurrent   AVB - beta blocker and Amiodarone stopped   Chronic anticoagulation- Eloquis started and Plavix stopped 10/07/12   First degree atrioventricular block   Hyperlipidemia   Chronic kidney disease, stage 3   PLAN: Cath today. We will take on our service.  Corine Shelter PA-C Beeper [phone] 10/12/2012, 8:17 AM  79 y/o woman with known  CAD - CABG & PCI on Sequential RadA-OM1-OM2 Graft p/w CP concerning for Angina. Per Dr. Erin Hearing initial plan, will proceed with cardiac cath today - was not dosed with Eliquis since admission, should be all but cleared by the time of cath. Cr seems to be stable.  This procedure has been fully reviewed with the patient and written informed consent has been obtained.   Marykay Lex, M.D., M.S. [facility] [address] [place], [place]  [address]  [phone] Pager # [phone] 10/12/2012 9:04 AM

## 2012-10-12 NOTE — Progress Notes (Signed)
Patient being transferred to [facility] service. Thanks.

## 2012-10-13 ENCOUNTER — Emergency Department (HOSPITAL_COMMUNITY): Payer: Medicare Other

## 2012-10-13 ENCOUNTER — Inpatient Hospital Stay (HOSPITAL_COMMUNITY)
Admission: EM | Admit: 2012-10-13 | Discharge: 2012-10-15 | DRG: 261 | Disposition: A | Discharge status: Home / Self Care | Payer: Medicare Other | Attending: Internal Medicine | Admitting: Internal Medicine

## 2012-10-13 ENCOUNTER — Encounter (HOSPITAL_COMMUNITY): Payer: Self-pay | Admitting: *Deleted

## 2012-10-13 DIAGNOSIS — R55 Syncope and collapse: Secondary | ICD-10-CM | POA: Diagnosis present

## 2012-10-13 DIAGNOSIS — A498 Other bacterial infections of unspecified site: Secondary | ICD-10-CM | POA: Diagnosis present

## 2012-10-13 DIAGNOSIS — Z951 Presence of aortocoronary bypass graft: Secondary | ICD-10-CM

## 2012-10-13 DIAGNOSIS — I10 Essential (primary) hypertension: Secondary | ICD-10-CM

## 2012-10-13 DIAGNOSIS — Z7982 Long term (current) use of aspirin: Secondary | ICD-10-CM

## 2012-10-13 DIAGNOSIS — Z955 Presence of coronary angioplasty implant and graft: Secondary | ICD-10-CM

## 2012-10-13 DIAGNOSIS — I440 Atrioventricular block, first degree: Secondary | ICD-10-CM | POA: Diagnosis present

## 2012-10-13 DIAGNOSIS — N390 Urinary tract infection, site not specified: Secondary | ICD-10-CM | POA: Diagnosis not present

## 2012-10-13 DIAGNOSIS — J9819 Other pulmonary collapse: Secondary | ICD-10-CM | POA: Diagnosis present

## 2012-10-13 DIAGNOSIS — Z8679 Personal history of other diseases of the circulatory system: Secondary | ICD-10-CM

## 2012-10-13 DIAGNOSIS — N179 Acute kidney failure, unspecified: Secondary | ICD-10-CM | POA: Diagnosis present

## 2012-10-13 DIAGNOSIS — N183 Chronic kidney disease, stage 3 unspecified: Secondary | ICD-10-CM

## 2012-10-13 DIAGNOSIS — I498 Other specified cardiac arrhythmias: Secondary | ICD-10-CM | POA: Diagnosis not present

## 2012-10-13 DIAGNOSIS — R209 Unspecified disturbances of skin sensation: Secondary | ICD-10-CM | POA: Diagnosis present

## 2012-10-13 DIAGNOSIS — E039 Hypothyroidism, unspecified: Secondary | ICD-10-CM | POA: Diagnosis present

## 2012-10-13 DIAGNOSIS — I129 Hypertensive chronic kidney disease with stage 1 through stage 4 chronic kidney disease, or unspecified chronic kidney disease: Secondary | ICD-10-CM | POA: Diagnosis present

## 2012-10-13 DIAGNOSIS — R079 Chest pain, unspecified: Secondary | ICD-10-CM | POA: Diagnosis not present

## 2012-10-13 DIAGNOSIS — N281 Cyst of kidney, acquired: Secondary | ICD-10-CM | POA: Diagnosis not present

## 2012-10-13 DIAGNOSIS — E785 Hyperlipidemia, unspecified: Secondary | ICD-10-CM

## 2012-10-13 DIAGNOSIS — Z7901 Long term (current) use of anticoagulants: Secondary | ICD-10-CM | POA: Diagnosis not present

## 2012-10-13 DIAGNOSIS — F17200 Nicotine dependence, unspecified, uncomplicated: Secondary | ICD-10-CM | POA: Diagnosis present

## 2012-10-13 DIAGNOSIS — I252 Old myocardial infarction: Secondary | ICD-10-CM

## 2012-10-13 DIAGNOSIS — I480 Paroxysmal atrial fibrillation: Secondary | ICD-10-CM

## 2012-10-13 DIAGNOSIS — Z9861 Coronary angioplasty status: Secondary | ICD-10-CM

## 2012-10-13 DIAGNOSIS — E860 Dehydration: Secondary | ICD-10-CM | POA: Diagnosis present

## 2012-10-13 DIAGNOSIS — I4430 Unspecified atrioventricular block: Secondary | ICD-10-CM | POA: Diagnosis not present

## 2012-10-13 DIAGNOSIS — I2510 Atherosclerotic heart disease of native coronary artery without angina pectoris: Secondary | ICD-10-CM | POA: Diagnosis present

## 2012-10-13 DIAGNOSIS — I4891 Unspecified atrial fibrillation: Secondary | ICD-10-CM | POA: Diagnosis present

## 2012-10-13 DIAGNOSIS — I4821 Permanent atrial fibrillation: Secondary | ICD-10-CM | POA: Diagnosis present

## 2012-10-13 LAB — CBC WITH DIFFERENTIAL/PLATELET
Basophils Relative: 0 % (ref 0–1)
Eosinophils Absolute: 0 10*3/uL (ref 0.0–0.7)
MCH: 27.6 pg (ref 26.0–34.0)
MCHC: 33 g/dL (ref 30.0–36.0)
Neutrophils Relative %: 64 % (ref 43–77)
Platelets: 169 10*3/uL (ref 150–400)

## 2012-10-13 LAB — TYPE AND SCREEN
ABO/RH(D): B POS
Antibody Screen: NEGATIVE

## 2012-10-13 LAB — CBC
HCT: 40.7 % (ref 36.0–46.0)
Hemoglobin: 13.5 g/dL (ref 12.0–15.0)
MCV: 85 fL (ref 78.0–100.0)
RDW: 13.5 % (ref 11.5–15.5)
WBC: 8.2 10*3/uL (ref 4.0–10.5)

## 2012-10-13 LAB — COMPREHENSIVE METABOLIC PANEL
ALT: 28 U/L (ref 0–35)
Albumin: 3.4 g/dL — ABNORMAL LOW (ref 3.5–5.2)
Calcium: 9.9 mg/dL (ref 8.4–10.5)
GFR calc Af Amer: 42 mL/min — ABNORMAL LOW (ref >90)
Glucose, Bld: 107 mg/dL — ABNORMAL HIGH (ref 70–99)
Potassium: 4.7 mEq/L (ref 3.5–5.1)
Sodium: 140 mEq/L (ref 135–145)
Total Protein: 5.9 g/dL — ABNORMAL LOW (ref 6.0–8.3)

## 2012-10-13 LAB — POCT I-STAT, CHEM 8
Chloride: 108 mEq/L (ref 96–112)
Glucose, Bld: 104 mg/dL — ABNORMAL HIGH (ref 70–99)
HCT: 42 % (ref 36.0–46.0)
Hemoglobin: 14.3 g/dL (ref 12.0–15.0)
Potassium: 4.2 mEq/L (ref 3.5–5.1)
Sodium: 141 mEq/L (ref 135–145)

## 2012-10-13 LAB — POCT I-STAT TROPONIN I: Troponin i, poc: 0.05 ng/mL (ref 0.00–0.08)

## 2012-10-13 LAB — ABO/RH: ABO/RH(D): B POS

## 2012-10-13 LAB — OCCULT BLOOD X 1 CARD TO LAB, STOOL: Fecal Occult Bld: NEGATIVE

## 2012-10-13 LAB — TROPONIN I: Troponin I: <0.3 ng/mL (ref <0.30)

## 2012-10-13 MED ORDER — IRBESARTAN 300 MG PO TABS
300.0000 mg | ORAL_TABLET | Freq: Every day | ORAL | Status: DC
  Administered 2012-10-14 – 2012-10-15 (×2): 300 mg via ORAL
  Filled 2012-10-13 (×3): qty 1

## 2012-10-13 MED ORDER — SODIUM CHLORIDE 0.9 % IJ SOLN
3.0000 mL | Freq: Two times a day (BID) | INTRAMUSCULAR | Status: DC
  Administered 2012-10-14: 3 mL via INTRAVENOUS

## 2012-10-13 MED ORDER — ACETAMINOPHEN 325 MG PO TABS: 650.0000 mg | ORAL_TABLET | ORAL | Status: DC | PRN

## 2012-10-13 MED ORDER — ISOSORBIDE MONONITRATE ER 30 MG PO TB24
30.0000 mg | ORAL_TABLET | Freq: Every day | ORAL | Status: DC
  Administered 2012-10-13: 30 mg via ORAL
  Filled 2012-10-13: qty 1

## 2012-10-13 MED ORDER — TRAMADOL HCL 50 MG PO TABS
50.0000 mg | ORAL_TABLET | Freq: Four times a day (QID) | ORAL | Status: DC | PRN
  Administered 2012-10-13 – 2012-10-15 (×3): 50 mg via ORAL
  Filled 2012-10-13 (×3): qty 1

## 2012-10-13 MED ORDER — IOHEXOL 300 MG/ML  SOLN
80.0000 mL | Freq: Once | INTRAMUSCULAR | Status: AC | PRN
  Administered 2012-10-13: 80 mL via INTRAVENOUS

## 2012-10-13 MED ORDER — LEVOTHYROXINE SODIUM 50 MCG PO TABS
50.0000 ug | ORAL_TABLET | Freq: Every day | ORAL | Status: DC
  Administered 2012-10-14: 50 ug via ORAL
  Filled 2012-10-13 (×4): qty 1

## 2012-10-13 MED ORDER — ALUM & MAG HYDROXIDE-SIMETH 200-200-20 MG/5ML PO SUSP: 15.0000 mL | ORAL | Status: DC | PRN

## 2012-10-13 MED ORDER — ATORVASTATIN CALCIUM 40 MG PO TABS
40.0000 mg | ORAL_TABLET | Freq: Every day | ORAL | Status: DC
  Administered 2012-10-14 – 2012-10-15 (×2): 40 mg via ORAL
  Filled 2012-10-13 (×3): qty 1

## 2012-10-13 MED ORDER — ISOSORBIDE MONONITRATE ER 30 MG PO TB24: 30.0000 mg | ORAL_TABLET | Freq: Every day | ORAL | Status: DC

## 2012-10-13 MED ORDER — AMLODIPINE BESYLATE 5 MG PO TABS
5.0000 mg | ORAL_TABLET | Freq: Every day | ORAL | Status: DC
  Administered 2012-10-14 – 2012-10-15 (×2): 5 mg via ORAL
  Filled 2012-10-13 (×3): qty 1

## 2012-10-13 MED ORDER — NITROGLYCERIN 0.4 MG SL SUBL: 0.4000 mg | SUBLINGUAL_TABLET | SUBLINGUAL | Status: DC | PRN

## 2012-10-13 MED ORDER — APIXABAN 5 MG PO TABS: 5.0000 mg | ORAL_TABLET | Freq: Two times a day (BID) | ORAL | Status: DC

## 2012-10-13 MED ORDER — PANTOPRAZOLE SODIUM 40 MG PO TBEC
40.0000 mg | DELAYED_RELEASE_TABLET | Freq: Every day | ORAL | Status: DC
  Administered 2012-10-14 – 2012-10-15 (×2): 40 mg via ORAL
  Filled 2012-10-13 (×3): qty 1

## 2012-10-13 MED ORDER — ASPIRIN 81 MG PO TBEC: 81.0000 mg | DELAYED_RELEASE_TABLET | Freq: Every day | ORAL | Status: DC

## 2012-10-13 MED ORDER — SODIUM CHLORIDE 0.9 % IV SOLN: INTRAVENOUS | Status: AC

## 2012-10-13 MED ORDER — SODIUM CHLORIDE 0.9 % IV BOLUS (SEPSIS)
500.0000 mL | Freq: Once | INTRAVENOUS | Status: AC
  Administered 2012-10-13: 500 mL via INTRAVENOUS

## 2012-10-13 MED ORDER — SODIUM CHLORIDE 0.9 % IV SOLN
1000.0000 mL | INTRAVENOUS | Status: DC
  Administered 2012-10-13: 1000 mL via INTRAVENOUS

## 2012-10-13 NOTE — Discharge Summary (Signed)
Physician Discharge Summary  Patient ID: Stephanie Atkinson MRN: [id number] DOB/AGE: [birth year] 77 y.o.  Admit date: 10/10/2012 Discharge date: 10/13/2012  Admission Diagnoses:  Chest pain with moderate risk for cardiac etiology  Principal Problem:   Chest pain with moderate risk for cardiac etiology Active Problems:   Hypertension   Hx of CABG X 4 3/06-    Hyperlipidemia   RCA DES placed 7/12- patent 10/12   PAF - recurrent   Chronic kidney disease, stage 3   AVB - beta blocker and Amiodarone stopped   Chronic anticoagulation- Eloquis started and Plavix stopped 10/07/12   First degree atrioventricular block   Discharged Condition: stable  Hospital Course: This is a 79 y.o. female with a past medical history significant for a history of CAD. She had CABG X 4 in 3/06. She had a DES placed to her PDA in July 2012, (RIMA-PDA occluded). This was patent on follow up cath in Oct 2012. She also has PAF, on Eliquis for anticoagulation, and first degree AV block. She was admitted on 10/11/12 for evaluation of left sided chest pain. Her EKG on admission demonstrated 1st degree AV block and nonspecific repolarization changes. There were also ST changes that had been chronically present, but looked subtly different compared to last tracing. Cardiac enzymes were cycled and were negative. However, a diagnostic left heart cath was recommended. The procedure was performed by Dr. Herbie Baltimore, via the right femoral artery. The cath demonstrated patent grafts and moderate to severe native coronary artery disease in very small vessels. There was no clear-cut culprit lesion for the patient's resting chest pain. She had normal LV function. Medical therapy was recommended. She left the cath lab in stable condition. She was kept an additional night for observation and hydration. Day 1 post cath, she had no further chest pain and the right femoral access site was stable, free of hematoma and bruit. Her renal function was stable.  She was started on 30 mg of Imdur daily. She was resumed on her PPI as well as all other home medications. She was last seen and examined by Dr. Royann Shivers, who determined that she was stable for discharge home. She will follow up with Dr. Allyson Sabal on 10/28/12.    Consults: None  Significant Diagnostic Studies:   LHC 10/12/12 POST-OPERATIVE DIAGNOSIS:  Moderate to severe native coronary artery disease in very small vessels. No clear-cut culprit lesion for the patient's resting chest pain. The radial graft to OM1-OM 2 does appear to be significantly atretic and small in caliber, however even the brisk flow in the native vessels, this is likely due to significant competitive flow. It is unlikely that the existing lesion in OM1 would cause resting chest pain.  The previous PTCA and PCI site of the RCA is widely patent, LIMA is widely patent.  Systemic hypertension, but mildly elevated LVEDP. Normal EF, with no regional wall motion abnormalities.  Essentially, despite the existence of disease in the circumflex system, these vessels are at best 2 mm in diameter, and the most significant lesion is the ostial OM1 lesion which would only be a PTCA candidate and not PCI due to the proximity to the parent Circumflex.  Treatments: See Hospital Course  Discharge Exam: Blood pressure 132/39, pulse 78, temperature 98.9 F (37.2 C), temperature source Oral, resp. rate 18, height 5\' 4"  (1.626 m), weight 158 lb 8 oz (71.895 kg), SpO2 93.00%.  Disposition: 01-Home or Self Care      Discharge Orders   Future Appointments  Provider Department Dept Phone   10/28/2012 10:00 AM Runell Gess, MD [facility] [place] [phone]   Future Orders Complete By Expires   Diet - low sodium heart healthy  As directed    Increase activity slowly  As directed        Medication List    STOP taking these medications       aspirin 325 MG tablet  Replaced by:  aspirin 81 MG EC tablet      clopidogrel 75 MG tablet  Commonly known as:  PLAVIX      TAKE these medications       acetaminophen 325 MG tablet  Commonly known as:  TYLENOL  Take 650 mg by mouth every 4 (four) hours as needed. For pain     alum & mag hydroxide-simeth 200-200-20 MG/5ML suspension  Commonly known as:  MAALOX/MYLANTA  Take 15 mLs by mouth every 2 (two) hours as needed.     amLODipine 5 MG tablet  Commonly known as:  NORVASC  Take 5 mg by mouth daily.     apixaban 5 MG Tabs tablet  Commonly known as:  ELIQUIS  Take 1 tablet (5 mg total) by mouth 2 (two) times daily.     aspirin 81 MG EC tablet  Take 1 tablet (81 mg total) by mouth daily.     atorvastatin 40 MG tablet  Commonly known as:  LIPITOR  Take 40 mg by mouth daily.     furosemide 40 MG tablet  Commonly known as:  LASIX  Take 40 mg by mouth daily as needed. For fluid retention     isosorbide mononitrate 30 MG 24 hr tablet  Commonly known as:  IMDUR  Take 1 tablet (30 mg total) by mouth daily.     levothyroxine 50 MCG tablet  Commonly known as:  SYNTHROID, LEVOTHROID  Take 50 mcg by mouth daily.     nitroGLYCERIN 0.4 MG SL tablet  Commonly known as:  NITROSTAT  Place 0.4 mg under the tongue every 5 (five) minutes as needed. For chest pain     pantoprazole 40 MG tablet  Commonly known as:  PROTONIX  Take 40 mg by mouth daily.     traMADol 50 MG tablet  Commonly known as:  ULTRAM  Take 50 mg by mouth as needed.     valsartan 320 MG tablet  Commonly known as:  DIOVAN  Take 320 mg by mouth daily.       Follow-up Information   Follow up with Runell Gess, MD On 10/28/2012. (10:00 am)    Specialty:  Cardiology   Contact information:   [address] [place] [address] [phone]     TIME SPENT ON DISCHARGE, INCLUDING PHYSICIAN TIME: > 30 MINUTES  Signed: Allayne Butcher, PA-C 10/13/2012, 9:38 AM

## 2012-10-13 NOTE — ED Notes (Signed)
Pt. Reports, "I feel weak."

## 2012-10-13 NOTE — H&P (Addendum)
[facility] History and Physical  DEJAE BERNET KGM:[id number] DOB: 08-05-1924 DOA: 10/13/2012  Referring physician:  Linwood Dibbles PCP:  Thayer Headings, MD   Chief Complaint:  Recurrent syncope  HPI:  The patient is a 79 y.o. year-old female with history of CAD s/p CABG x 4 in 2006 and RCA DES placed 2012, recurrent PAF on apixaban, AVB now off BB and amiodarone, CKD stage 3  who presented with chest pain on 10/11/12 to the ER.  The patient was last at their baseline health approximately 1 week prior to that admission.  Her troponins were negative, however, she had some subtle ST changes compared to prior so she was seen by cardiology who performed a left heart catheterization on 8/18 which demonstrated patent grafts and moderate to severe coronary artery disease of the native vessels.  She had no angioplasty or stents placed.  She remained chest pain free and was started on imdur 30mg  daily.  She was discharged home this morning.  En route from the hospital to home, she developed blurry vision followed by flushing and then partial unresponsiveness with head hanging.  She had nausea and pallor.  She denies chest pain or shortness of breath.  Her daughter brought her back to the ER.  In triage, she syncopized and was described as "pulseless" but she spontaneously recovered without chest compressions or assisted ventilation.  Currently she feels like the right side of her lip is numb or not right.  She has chronic numbness of the right arm and leg.  Denies focal weakness, but endorses generalized weakness and persistent nausea.     Her labs demonstrated some hemoconcentration with hgb of 14 compared to 12mg /dl yesterday.  Her creatinine was 1.4, slightly increased from 1.15 two days ago.  POC troponin 0.05.  Glucose 104.  CT abd/pelvis demonstrated no retroperitoneal hematoma.  ECG demonstrated atrial fibrillation with inversion of T-wave from previous in lead V2, but otherwise stable from prior  including previously inverted T-waves in V5-6 and I and aVL.    Review of Systems:  Denies fevers, chills.  Denies rhinorrhea, sinus congestion, sore throat.  Denies chest pain and palpitations.  Denies SOB, wheezing, cough. + nausea, vomiting.  Denies constipation, diarrhea.  Denies dysuria, frequency, urgency, polyuria, polydipsia.  Denies hematemesis, blood in stools, melena, abnormal bruising or bleeding.  Denies lymphadenopathy.  Denies arthralgias, myalgias.  Denies skin rash or ulcer.  Denies lower extremity edema.  Mild slurred speech.  Denies anxiety and depression.    Past Medical History  Diagnosis Date  . Hypertension   . Hx of CABG 2006    X4  . MI, old     "mild"  . Hyperlipidemia   . Presence of stent in right coronary artery 07/12    RIMA-PDA occluded, DES stent placed  . PAF (paroxysmal atrial fibrillation)     on AMIO  . Syncope 07/12    bradycardic, beta blocker decreased  . Chronic kidney disease, stage 3   . Hypothyroid   . Coronary artery disease    Past Surgical History  Procedure Laterality Date  . Carotid stent      2012  . Cardiac catheterization  [id number]    dr. Onalee Hua harding, revealing a atretic bypass to her right with patent distal RCA stent  . Doppler echocardiography  [id number]    mild asymmetric left ventricular hypertrophy, left ventricular systolic function is low normal, ejection fraction = 50-55%, the LA is moderate dilated, the RA is mildly dilated, no  significant valvular disease  . Nm myoview ltd  100510    post stress left ventricle is normal in size, post stress ejection fraction is 67% global left ventricular systolic function is normal, normal myocardial perfusion study, abnormal myocardial perfusion study, low risk scan   Social History:  reports that she has never smoked. Her smokeless tobacco use includes Snuff. She reports that she does not drink alcohol or use illicit drugs.   Allergies  Allergen Reactions  . Penicillins Hives  .  Sulfa Antibiotics Swelling    Family History  Problem Relation Age of Onset  . CAD Mother   . CAD Maternal Grandmother      Prior to Admission medications   Medication Sig Start Date End Date Taking? Authorizing Provider  acetaminophen (TYLENOL) 325 MG tablet Take 650 mg by mouth every 4 (four) hours as needed. For pain    Historical Provider, MD  alum & mag hydroxide-simeth (MAALOX/MYLANTA) 200-200-20 MG/5ML suspension Take 15 mLs by mouth every 2 (two) hours as needed.    Historical Provider, MD  amLODipine (NORVASC) 5 MG tablet Take 5 mg by mouth daily. 06/22/12   Historical Provider, MD  apixaban (ELIQUIS) 5 MG TABS tablet Take 1 tablet (5 mg total) by mouth 2 (two) times daily. 10/13/12   Brittainy Sharol Harness, PA-C  aspirin EC 81 MG EC tablet Take 1 tablet (81 mg total) by mouth daily. 10/13/12   Brittainy Simmons, PA-C  atorvastatin (LIPITOR) 40 MG tablet Take 40 mg by mouth daily.    Historical Provider, MD  furosemide (LASIX) 40 MG tablet Take 40 mg by mouth daily as needed. For fluid retention    Historical Provider, MD  isosorbide mononitrate (IMDUR) 30 MG 24 hr tablet Take 1 tablet (30 mg total) by mouth daily. 10/13/12   Brittainy Simmons, PA-C  levothyroxine (SYNTHROID, LEVOTHROID) 50 MCG tablet Take 50 mcg by mouth daily.    Historical Provider, MD  nitroGLYCERIN (NITROSTAT) 0.4 MG SL tablet Place 0.4 mg under the tongue every 5 (five) minutes as needed. For chest pain    Historical Provider, MD  pantoprazole (PROTONIX) 40 MG tablet Take 40 mg by mouth daily.    Historical Provider, MD  traMADol (ULTRAM) 50 MG tablet Take 50 mg by mouth as needed. 06/22/12   Historical Provider, MD  valsartan (DIOVAN) 320 MG tablet Take 320 mg by mouth daily.    Historical Provider, MD   Physical Exam: Filed Vitals:   10/13/12 1513 10/13/12 1515 10/13/12 1530 10/13/12 1545  BP: 155/56 155/56 149/55 158/60  Pulse: 72 76 75 80  Temp:      TempSrc:      Resp: 18 15 23 14   SpO2: 97% 95% 95% 97%      General:  Thin AAF, no acute distress, but pallor and slow to respond, some slurring of speech  Eyes:  PERRL, anicteric, non-injected.  ENT:  Nares clear.  OP clear, non-erythematous without plaques or exudates.  MMM.  Neck:  Supple without TM or JVD.    Lymph:  No cervical, supraclavicular, or submandibular LAD.  Cardiovascular:  RRR, normal S1, S2, without m/r/g.  2+ pulses, warm extremities  Respiratory:  CTA bilaterally without increased WOB.  Abdomen:  NABS.  Soft, ND/NT.    Skin:  No rashes or focal lesions.  Musculoskeletal:  Normal bulk and tone.  No LE edema.  Psychiatric:  A & O x 4.  Appropriate affect.  Neurologic:  CN 3-12 intact.  5/5 strength BUE and  LLE, 4/5 RLE which is chronic due to pain.  Sensation decreased right arm and leg which is chronic.  Mild slurred speech.  Bilateral dysmetria upper extremities with slow response somewhat suggestive of dementia.    Labs on Admission:  Basic Metabolic Panel:  Recent Labs Lab 10/10/12 2037 10/11/12 0450 10/12/12 0612 10/13/12 1227 10/13/12 1244  NA 139 142 144 140 141  K 4.1 3.8 4.0 4.7 4.2  CL 105 111 111 106 108  CO2 23 24 26 23   --   GLUCOSE 105* 104* 90 107* 104*  BUN 23 20 19 21  25*  CREATININE 1.06 1.12* 1.15* 1.29* 1.40*  CALCIUM 9.8 9.8 9.8 9.9  --    Liver Function Tests:  Recent Labs Lab 10/13/12 1227  AST 34  ALT 28  ALKPHOS 120*  BILITOT 0.7  PROT 5.9*  ALBUMIN 3.4*    Recent Labs Lab 10/13/12 1227  LIPASE 39   No results found for this basename: AMMONIA,  in the last 168 hours CBC:  Recent Labs Lab 10/10/12 2037 10/11/12 0450 10/12/12 0612 10/13/12 1244  WBC 5.0 6.0 4.6  --   HGB 12.8 11.6* 12.9 14.3  HCT 37.5 36.5 39.0 42.0  MCV 83.7 84.9 83.3  --   PLT 199 182 192  --    Cardiac Enzymes:  Recent Labs Lab 10/11/12 0011 10/11/12 0741 10/11/12 1148  TROPONINI <0.30 <0.30 <0.30    BNP (last 3 results) No results found for this basename: PROBNP,  in the  last 8760 hours CBG:  Recent Labs Lab 10/13/12 1219  GLUCAP 94    Radiological Exams on Admission: Ct Abdomen Pelvis W Contrast  10/13/2012   *RADIOLOGY REPORT*  Clinical Data: Epigastric pain syncope.  Rule out retroperitoneal bleed  CT ABDOMEN AND PELVIS WITH CONTRAST  Technique:  Multidetector CT imaging of the abdomen and pelvis was performed following the standard protocol during bolus administration of intravenous contrast.  Contrast: 80mL OMNIPAQUE IOHEXOL 300 MG/ML  SOLN  Comparison: CT 10/18/2010  Findings: The lung bases are clear.  Heart size is upper normal.  Prior cholecystectomy.  Bile ducts are not dilated.  No focal liver lesion.  Pancreas and spleen are normal.  Bilateral renal cyst.  No renal obstruction or mass.  Atherosclerotic aorta without aneurysm.  Negative for hematoma.  No free fluid.  Negative for bowel obstruction or bowel thickening.  Normal appendix.  Negative for mass or adenopathy.  IMPRESSION: No acute abnormality.  Negative for aortic aneurysm or hematoma.   Original Report Authenticated By: Janeece Riggers, M.D.   Dg Chest Port 1 View  10/13/2012   *RADIOLOGY REPORT*  Clinical Data: Near-syncope  PORTABLE CHEST - 1 VIEW  Comparison: 10/10/2012  Findings: Increased opacities noted the lung bases accentuated by lower lung volumes are seen on the prior study.  This is likely atelectasis. No evidence of pulmonary edema.  No pleural effusion or pneumothorax.  Changes from CABG surgery are stable.  There is mild cardiomegaly. No mediastinal or hilar masses are noted.  IMPRESSION: Mild lung base opacity accentuated by low lung volumes.  The opacities are most likely atelectasis.  Infiltrate should be considered in the proper clinical setting.  Mild cardiomegaly and changes from prior CABG surgery.   Original Report Authenticated By: Amie Portland, M.D.    EKG:  ECG demonstrated atrial fibrillation with inversion of T-wave from previous in lead V2, but otherwise stable from  prior including previously inverted T-waves in V5-6 and I and aVL.  Assessment/Plan Principal Problem:   Syncope and collapse Active Problems:   Hypertension   Hx of CABG X 4 3/06-    Hyperlipidemia   RCA DES placed 7/12- patent 10/12   PAF - recurrent   Chronic kidney disease, stage 3   AVB - beta blocker and Amiodarone stopped   Chronic anticoagulation- Eloquis started and Plavix stopped 10/07/12   First degree atrioventricular block   Principal Problem:   Syncope and collapse, DDX includes medication side effect from imdur, arrhythmia including higher degree heart block, stroke or TIA (particularly right after catheterization), new infection, dehydration, GIB, ACS.   Retroperitoneal bleed not seen on CT abd/pelvis. -  Admit to stepdown due to concern raised by "pulseless" episode ?higher degree heart block prior to current a-fib rhythm -  CXR demonstrates atelectasis and possible infiltrate, but currently without SOB or cough, will monitor -  UA pending -  Telemetry -  Cycle troponins  -  Cycle H/H -  Occult stool -  D/c imdur -  CT head to rule out hemorrhage -  MRI brain without contrast to rule out acute stroke.  Not candidate for TPA due to ongoing A/C.   -  Neurology consult if positive for stroke -  IVF and hold lasix -  Cardiology to follow  Abdominal pain:  LFTs and lipase wnl.  CT abd/pelvis negative for bowel thickening, obstruction, stones, or other notable abnormality which would explain her pain. -  UA pending.    Active Problems:   Hypertension, blood pressure improving since event in ER -  Continue ARB and norvasc with hold parameters  Hx of CABG X 4 3/06, RCA DES placed 7/12 -  Continue norvasc, ARB, statin and apixaban in AM if no evidence of GIB  Hyperlipidemia, continue statin  PAF - recurrent.  Rate controlled.  Hold apixaban until GIB ruled out.   -  Restart A/C in AM  First degree atrioventricular block, avoid AV blocking medications such as  beta blocker/amiodarone  Chronic kidney disease, stage 3, creatinine higher than baseline post cath.  May have some mild CIN -  Gentle hydration and hold lasix  Diet:  Healthy heart Access:  PIV IVF:  NS at 12ml/h x 12 h Proph:  apixaban  Code Status: full Family Communication: spoke with patient and her daughters Disposition Plan:  stepdown  Time spent: 60 min  Renae Fickle [facility] Pager [phone]  If 7PM-7AM, please contact night-coverage [email or web address] Password [facility] 10/13/2012, 4:35 PM

## 2012-10-13 NOTE — Progress Notes (Signed)
Chaplain Note: Found pt's daughter in Consult A. She was crying and quite distraught.  Talked about not wanting to see her mother suffer like this. I provided provided emotional support and joined her as she prayed for strength.  She expressed strong faith and a desire to submit to God's will but also stated she cannot see her mother in the condition she is in. Engaged in active and reflective listening as pt's daughter talked about her trips to [place] and the spiritual/emotional significance of these trips on her. Stayed with her until she was calm and at a good place.  She pronounced a blessing on me and thanked me for my presence and support.  Will follow up as needed.  Rutherford Nail Chaplain

## 2012-10-13 NOTE — Consult Note (Signed)
[facility]       CONSULTATION NOTE   Reason for Consult: Syncope  Requesting Physician: Short  Cardiologist: Allyson Sabal  HPI: This is a 79 y.o. female with a past medical history significant for coronary artery disease, recently evaluated again by coronary angiography, experienced a syncopal event while being driven home after her recent hospitalization.  She was feeling well this morning and had no difficulty getting into the car as she was discharged. While she was being driven away from the hospital she complained of blurry vision and a sensation of feeling hot. She did not have chest pain or shortness of breath. Subsequently, she became very quiet and complained of feeling weak. Her head tilted forward but she did not completely lose consciousness. Her daughter brought her immediately back to the emergency room and while she was being evaluated in triage she completely lost consciousness. Her blood pressure was recorded as approximately 92/66, but she was not hooked up to telemetry or electrocardiogram at the time of her syncopal event. She recovered very quickly but continued to feel weak and have a blurry vision.  As she had had a cardiac catheterization via right femoral approach the day before, a CT scan of the abdomen and pelvis was performed. It did not show any evidence of retroperitoneal hematoma.  Of note she had an almost identical syncopal event when she was being driven home after previous cardiac catheterization. These are the only 2 syncopal event she has had in her entire lifetime.  Also of note she was in sinus rhythm yesterday and today she is back in atrial fibrillation. She is known to have long-standing paroxysmal atrial fibrillation and is on chronic anticoagulation.  PMHx:  Past Medical History  Diagnosis Date  . Hypertension   . Hx of CABG 2006    X4  . MI, old     "mild"  . Hyperlipidemia   . Presence of stent in right  coronary artery 07/12    RIMA-PDA occluded, DES stent placed  . PAF (paroxysmal atrial fibrillation)     on AMIO  . Syncope 07/12    bradycardic, beta blocker decreased  . Chronic kidney disease, stage 3   . Hypothyroid   . Coronary artery disease    Past Surgical History  Procedure Laterality Date  . Carotid stent      2012  . Cardiac catheterization  [id number]    dr. Onalee Hua harding, revealing a atretic bypass to her right with patent distal RCA stent  . Doppler echocardiography  [id number]    mild asymmetric left ventricular hypertrophy, left ventricular systolic function is low normal, ejection fraction = 50-55%, the LA is moderate dilated, the RA is mildly dilated, no significant valvular disease  . Nm myoview ltd  100510    post stress left ventricle is normal in size, post stress ejection fraction is 67% global left ventricular systolic function is normal, normal myocardial perfusion study, abnormal myocardial perfusion study, low risk scan    FAMHx: No family history on file.  SOCHx:  reports that she has never smoked. Her smokeless tobacco use includes Snuff. She reports that she does not drink alcohol or use illicit drugs.  ALLERGIES: Allergies  Allergen Reactions  . Penicillins Hives  . Sulfa Antibiotics Swelling    ROS: Constitutional: positive for fatigue, negative for anorexia, chills and fevers Eyes: positive for Blurry vision, transient Ears, nose, mouth, throat, and face: negative Respiratory: negative for cough, dyspnea  on exertion, hemoptysis and wheezing Cardiovascular: positive for fatigue and syncope, negative for chest pain, lower extremity edema, orthopnea, palpitations and paroxysmal nocturnal dyspnea Gastrointestinal: negative for abdominal pain, constipation, diarrhea, dysphagia, nausea and vomiting Genitourinary:negative Hematologic/lymphatic: negative for bleeding, easy bruising and petechiae Musculoskeletal:negative Neurological: positive for Syncope;  she also says that her right lip was "drawing" for a few seconds; , negative for gait problems, headaches, paresthesia, seizures, speech problems and vertigo Behavioral/Psych: negative Endocrine: negative for diabetic symptoms including polydipsia and polyuria Allergic/Immunologic: negative  HOME MEDICATIONS:  (Not in a hospital admission)  HOSPITAL MEDICATIONS: I have reviewed the patient's current medications. Prior to Admission:  (Not in a hospital admission)  VITALS: Blood pressure 158/60, pulse 80, temperature 98.3 F (36.8 C), temperature source Oral, resp. rate 14, SpO2 97.00%.  PHYSICAL EXAM: General appearance: alert, cooperative and no distress Neck: no adenopathy, no carotid bruit, no JVD, supple, symmetrical, trachea midline and thyroid not enlarged, symmetric, no tenderness/mass/nodules Lungs: clear to auscultation bilaterally Heart: irregularly irregular rhythm, S1, S2 normal and No murmurs or gallops Abdomen: soft, non-tender; bowel sounds normal; no masses,  no organomegaly Extremities: extremities normal, atraumatic, no cyanosis or edema Pulses: 2+ and symmetric Skin: Skin color, texture, turgor normal. No rashes or lesions Neurologic: Alert and oriented X 3, normal strength and tone. Normal symmetric reflexes. Normal coordination and gait The right femoral access site is healthy without evidence of ecchymosis or hematoma or bruit  LABS: Results for orders placed during the hospital encounter of 10/13/12 (from the past 48 hour(s))  GLUCOSE, CAPILLARY     Status: None   Collection Time    10/13/12 12:19 PM      Result Value Range   Glucose-Capillary 94  70 - 99 mg/dL  COMPREHENSIVE METABOLIC PANEL     Status: Abnormal   Collection Time    10/13/12 12:27 PM      Result Value Range   Sodium 140  135 - 145 mEq/L   Potassium 4.7  3.5 - 5.1 mEq/L   Comment: HEMOLYZED SPECIMEN, RESULTS MAY BE AFFECTED   Chloride 106  96 - 112 mEq/L   CO2 23  19 - 32 mEq/L    Glucose, Bld 107 (*) 70 - 99 mg/dL   BUN 21  6 - 23 mg/dL   Creatinine, Ser 4.09 (*) 0.50 - 1.10 mg/dL   Calcium 9.9  8.4 - 81.1 mg/dL   Total Protein 5.9 (*) 6.0 - 8.3 g/dL   Albumin 3.4 (*) 3.5 - 5.2 g/dL   AST 34  0 - 37 U/L   ALT 28  0 - 35 U/L   Alkaline Phosphatase 120 (*) 39 - 117 U/L   Total Bilirubin 0.7  0.3 - 1.2 mg/dL   GFR calc non Af Amer 36 (*) >90 mL/min   GFR calc Af Amer 42 (*) >90 mL/min   Comment: (NOTE)     The eGFR has been calculated using the CKD EPI equation.     This calculation has not been validated in all clinical situations.     eGFR's persistently <90 mL/min signify possible Chronic Kidney     Disease.  PROTIME-INR     Status: Abnormal   Collection Time    10/13/12 12:27 PM      Result Value Range   Prothrombin Time 17.2 (*) 11.6 - 15.2 seconds   INR 1.44  0.00 - 1.49  LIPASE, BLOOD     Status: None   Collection Time    10/13/12  12:27 PM      Result Value Range   Lipase 39  11 - 59 U/L  TYPE AND SCREEN     Status: None   Collection Time    10/13/12 12:30 PM      Result Value Range   ABO/RH(D) B POS     Antibody Screen NEG     Sample Expiration 10/16/2012    ABO/RH     Status: None   Collection Time    10/13/12 12:30 PM      Result Value Range   ABO/RH(D) B POS    POCT I-STAT TROPONIN I     Status: None   Collection Time    10/13/12 12:42 PM      Result Value Range   Troponin i, poc 0.05  0.00 - 0.08 ng/mL   Comment 3            Comment: Due to the release kinetics of cTnI,     a negative result within the first hours     of the onset of symptoms does not rule out     myocardial infarction with certainty.     If myocardial infarction is still suspected,     repeat the test at appropriate intervals.  POCT I-STAT, CHEM 8     Status: Abnormal   Collection Time    10/13/12 12:44 PM      Result Value Range   Sodium 141  135 - 145 mEq/L   Potassium 4.2  3.5 - 5.1 mEq/L   Chloride 108  96 - 112 mEq/L   BUN 25 (*) 6 - 23 mg/dL    Creatinine, Ser 1.61 (*) 0.50 - 1.10 mg/dL   Glucose, Bld 096 (*) 70 - 99 mg/dL   Calcium, Ion 0.45  4.09 - 1.30 mmol/L   TCO2 22  0 - 100 mmol/L   Hemoglobin 14.3  12.0 - 15.0 g/dL   HCT 81.1  91.4 - 78.2 %    IMAGING: Ct Abdomen Pelvis W Contrast  10/13/2012   *RADIOLOGY REPORT*  Clinical Data: Epigastric pain syncope.  Rule out retroperitoneal bleed  CT ABDOMEN AND PELVIS WITH CONTRAST  Technique:  Multidetector CT imaging of the abdomen and pelvis was performed following the standard protocol during bolus administration of intravenous contrast.  Contrast: 80mL OMNIPAQUE IOHEXOL 300 MG/ML  SOLN  Comparison: CT 10/18/2010  Findings: The lung bases are clear.  Heart size is upper normal.  Prior cholecystectomy.  Bile ducts are not dilated.  No focal liver lesion.  Pancreas and spleen are normal.  Bilateral renal cyst.  No renal obstruction or mass.  Atherosclerotic aorta without aneurysm.  Negative for hematoma.  No free fluid.  Negative for bowel obstruction or bowel thickening.  Normal appendix.  Negative for mass or adenopathy.  IMPRESSION: No acute abnormality.  Negative for aortic aneurysm or hematoma.   Original Report Authenticated By: Janeece Riggers, M.D.   Dg Chest Port 1 View  10/13/2012   *RADIOLOGY REPORT*  Clinical Data: Near-syncope  PORTABLE CHEST - 1 VIEW  Comparison: 10/10/2012  Findings: Increased opacities noted the lung bases accentuated by lower lung volumes are seen on the prior study.  This is likely atelectasis. No evidence of pulmonary edema.  No pleural effusion or pneumothorax.  Changes from CABG surgery are stable.  There is mild cardiomegaly. No mediastinal or hilar masses are noted.  IMPRESSION: Mild lung base opacity accentuated by low lung volumes.  The opacities are most likely atelectasis.  Infiltrate should be considered in the proper clinical setting.  Mild cardiomegaly and changes from prior CABG surgery.   Original Report Authenticated By: Amie Portland, M.D.     IMPRESSION: 1. The clinical pattern of her loss of consciousness is most consistent with a neurally mediated syncope, but there is also substantial concern for possible tachycardia-bradycardia syndrome in this elderly lady with paroxysmal atrial fibrillation. It is possible that she had post fibrillatory sinus node arrest. Currently her vital signs are normal she is in atrial fibrillation with controlled ventricular rate.  RECOMMENDATION: 1. I would advocate monitoring her via telemetry for about 24 hours. Hopefully this will suffice to identify a cause for syncope. She might require pacemaker therapy, but this is unclear at this time. If she has a neurally mediated syncope, then vasodepressive syncope may still occur even with a pacemaker in place. If telemetry does not answer the question, I would go ahead with implantation of a loop recorder([employer] Linq), maybe on Thursday morning.  Time Spent Directly with Patient: 45 minutes  Thurmon Fair, MD, [facility] [phone] office [phone] pager  10/13/2012, 4:21 PM

## 2012-10-13 NOTE — ED Provider Notes (Signed)
CSN: [id number]     Arrival date & time 10/13/12  1147 History  First MD Initiated Contact with Patient 10/13/12 1216     Chief Complaint  Patient presents with  . Near Syncope    HPI Patient presents to the emergency room after having a syncopal episode. The patient was recently admitted to the hospital for evaluation of chest pain. She underwent a cardiac catheterization.  Pt did not have any lesions that were amenable to intervention.  The patient was being released today and was going out to the car when she began to have abdominal pain, nausea, neck pain and the sensation that she was going to pass out. Patient states she got very lightheaded and her vision started to get blurry .  Patient didn't get to the triage area and at that time had a syncopal episode. The nurse had difficulty feeling her pulse.  Patient was immediately brought back to the resuscitation room. On my arrival the patient was already alert and awake and answering questions. Patient denies any abdominal pain at this time. She denies any chest pain or neck pain. She does feel some nausea still. She has not had any vomiting. Past Medical History  Diagnosis Date  . Hypertension   . Hx of CABG 2006    X4  . MI, old     "mild"  . Hyperlipidemia   . Presence of stent in right coronary artery 07/12    RIMA-PDA occluded, DES stent placed  . PAF (paroxysmal atrial fibrillation)     on AMIO  . Syncope 07/12    bradycardic, beta blocker decreased  . Chronic kidney disease, stage 3   . Hypothyroid   . Coronary artery disease    Past Surgical History  Procedure Laterality Date  . Carotid stent      2012  . Cardiac catheterization  409811    dr. Onalee Hua harding, revealing a atretic bypass to her right with patent distal RCA stent  . Doppler echocardiography  [id number]    mild asymmetric left ventricular hypertrophy, left ventricular systolic function is low normal, ejection fraction = 50-55%, the LA is moderate dilated, the RA  is mildly dilated, no significant valvular disease  . Nm myoview ltd  100510    post stress left ventricle is normal in size, post stress ejection fraction is 67% global left ventricular systolic function is normal, normal myocardial perfusion study, abnormal myocardial perfusion study, low risk scan   No family history on file. History  Substance Use Topics  . Smoking status: Never Smoker   . Smokeless tobacco: Current User    Types: Snuff  . Alcohol Use: No   OB History   Grav Para Term Preterm Abortions TAB SAB Ect Mult Living                 Review of Systems  All other systems reviewed and are negative.    Allergies  Penicillins and Sulfa antibiotics  Home Medications   Current Outpatient Rx  Name  Route  Sig  Dispense  Refill  . acetaminophen (TYLENOL) 325 MG tablet   Oral   Take 650 mg by mouth every 4 (four) hours as needed. For pain         . alum & mag hydroxide-simeth (MAALOX/MYLANTA) 200-200-20 MG/5ML suspension   Oral   Take 15 mLs by mouth every 2 (two) hours as needed.         [name] amLODipine (NORVASC) 5 MG tablet  Oral   Take 5 mg by mouth daily.         [name] apixaban (ELIQUIS) 5 MG TABS tablet   Oral   Take 1 tablet (5 mg total) by mouth 2 (two) times daily.   60 tablet   5   . aspirin EC 81 MG EC tablet   Oral   Take 1 tablet (81 mg total) by mouth daily.         [name] atorvastatin (LIPITOR) 40 MG tablet   Oral   Take 40 mg by mouth daily.         . furosemide (LASIX) 40 MG tablet   Oral   Take 40 mg by mouth daily as needed. For fluid retention         . isosorbide mononitrate (IMDUR) 30 MG 24 hr tablet   Oral   Take 1 tablet (30 mg total) by mouth daily.   30 tablet   5   . levothyroxine (SYNTHROID, LEVOTHROID) 50 MCG tablet   Oral   Take 50 mcg by mouth daily.         . nitroGLYCERIN (NITROSTAT) 0.4 MG SL tablet   Sublingual   Place 0.4 mg under the tongue every 5 (five) minutes as needed. For chest pain         .  pantoprazole (PROTONIX) 40 MG tablet   Oral   Take 40 mg by mouth daily.         . traMADol (ULTRAM) 50 MG tablet   Oral   Take 50 mg by mouth as needed.         . valsartan (DIOVAN) 320 MG tablet   Oral   Take 320 mg by mouth daily.          BP 92/49  Temp(Src) 98.3 F (36.8 C) (Oral)  Resp 18  SpO2 98% Physical Exam  Nursing note and vitals reviewed. HENT:  Head: Normocephalic and atraumatic.  Right Ear: External ear normal.  Left Ear: External ear normal.  Eyes: Conjunctivae are normal. Right eye exhibits no discharge. Left eye exhibits no discharge. No scleral icterus.  Neck: Neck supple. No tracheal deviation present.  Cardiovascular: Normal rate, regular rhythm and intact distal pulses.   Pulmonary/Chest: Effort normal and breath sounds normal. No stridor. No respiratory distress. She has no wheezes. She has no rales.  Abdominal: Soft. Bowel sounds are normal. She exhibits no distension and no mass. There is no tenderness. There is no rebound and no guarding.  Musculoskeletal: She exhibits no edema and no tenderness.  Status post cardiac catheterization left femoral artery, and small hematoma, no pulsatile lesion  Neurological: She is alert. She has normal strength. No sensory deficit. Cranial nerve deficit:  no gross defecits noted. She exhibits normal muscle tone. She displays no seizure activity. Coordination normal.  Skin: Skin is warm and dry. No rash noted. She is not diaphoretic. There is pallor.  Psychiatric: She has a normal mood and affect.    ED Course  1517 patient is alert and awake in no distress. She appears comfortable  EKG Atrial fibrillation rate 63 Normal axis, regular intervals  normal ST T waves Irregular rhythm is new since prior EKG  Procedures (including critical care time)  Labs Reviewed  COMPREHENSIVE METABOLIC PANEL - Abnormal; Notable for the following:    Glucose, Bld 107 (*)    Creatinine, Ser 1.29 (*)    Total Protein 5.9  (*)    Albumin 3.4 (*)  Alkaline Phosphatase 120 (*)    GFR calc non Af Amer 36 (*)    GFR calc Af Amer 42 (*)    All other components within normal limits  PROTIME-INR - Abnormal; Notable for the following:    Prothrombin Time 17.2 (*)    All other components within normal limits  POCT I-STAT, CHEM 8 - Abnormal; Notable for the following:    BUN 25 (*)    Creatinine, Ser 1.40 (*)    Glucose, Bld 104 (*)    All other components within normal limits  GLUCOSE, CAPILLARY  LIPASE, BLOOD  CBC  URINALYSIS, ROUTINE W REFLEX MICROSCOPIC  POCT I-STAT TROPONIN I  POCT CBG (FASTING - GLUCOSE)-MANUAL ENTRY  TYPE AND SCREEN  ABO/RH   Ct Abdomen Pelvis W Contrast  10/13/2012   *RADIOLOGY REPORT*  Clinical Data: Epigastric pain syncope.  Rule out retroperitoneal bleed  CT ABDOMEN AND PELVIS WITH CONTRAST  Technique:  Multidetector CT imaging of the abdomen and pelvis was performed following the standard protocol during bolus administration of intravenous contrast.  Contrast: 80mL OMNIPAQUE IOHEXOL 300 MG/ML  SOLN  Comparison: CT 10/18/2010  Findings: The lung bases are clear.  Heart size is upper normal.  Prior cholecystectomy.  Bile ducts are not dilated.  No focal liver lesion.  Pancreas and spleen are normal.  Bilateral renal cyst.  No renal obstruction or mass.  Atherosclerotic aorta without aneurysm.  Negative for hematoma.  No free fluid.  Negative for bowel obstruction or bowel thickening.  Normal appendix.  Negative for mass or adenopathy.  IMPRESSION: No acute abnormality.  Negative for aortic aneurysm or hematoma.   Original Report Authenticated By: Janeece Riggers, M.D.   Dg Chest Port 1 View  10/13/2012   *RADIOLOGY REPORT*  Clinical Data: Near-syncope  PORTABLE CHEST - 1 VIEW  Comparison: 10/10/2012  Findings: Increased opacities noted the lung bases accentuated by lower lung volumes are seen on the prior study.  This is likely atelectasis. No evidence of pulmonary edema.  No pleural effusion  or pneumothorax.  Changes from CABG surgery are stable.  There is mild cardiomegaly. No mediastinal or hilar masses are noted.  IMPRESSION: Mild lung base opacity accentuated by low lung volumes.  The opacities are most likely atelectasis.  Infiltrate should be considered in the proper clinical setting.  Mild cardiomegaly and changes from prior CABG surgery.   Original Report Authenticated By: Amie Portland, M.D.   1. Syncope     MDM  Patient was recently discharged from hospital after having a cardiac catheterization for evaluation of chest pain. The patient was attempting to get into the car after being discharged she had a syncopal episode.  Laboratory tests are unremarkable. CT scan fortunately does not show any evidence of retroperitoneal hematoma to account for her lightheadedness  and abdominal pain.  Patient's EKG does show questionable A. fib. I will repeat EKG. We'll continue to monitor her vital signs.  I consulted  the cardiology service, Dr. Herbie Baltimore. I spoke with Dr Malachi Bonds from the medical service.  The patient will be admitted to the hospital for monitoring and further evaluation.  Celene Kras, MD 10/13/12 [phone]

## 2012-10-13 NOTE — ED Notes (Addendum)
cbg -94-- reported to lori-rn

## 2012-10-13 NOTE — Progress Notes (Signed)
[facility]  Subjective: No complaints. Denies further chest pain. No groin pain.   Objective: Vital signs in last 24 hours: Temp:  [97.9 F (36.6 C)-99.6 F (37.6 C)] 98.9 F (37.2 C) (08/19 0724) Pulse Rate:  [64-86] 78 (08/19 0724) Resp:  [18] 18 (08/19 0724) BP: (132-169)/(39-63) 132/39 mmHg (08/19 0724) SpO2:  [93 %-100 %] 93 % (08/19 0724) Last BM Date: 10/09/12  Intake/Output from previous day: 08/18 0701 - 08/19 0700 In: 360 [P.O.:360] Out: -  Intake/Output this shift:    Medications Current Facility-Administered Medications  Medication Dose Route Frequency Provider Last Rate Last Dose  . 0.9 %  sodium chloride infusion   Intravenous Continuous Ron Parker, MD 10 mL/hr at 10/11/12 0041    . 0.9 %  sodium chloride infusion  250 mL Intravenous PRN Marykay Lex, MD      . acetaminophen (TYLENOL) tablet 650 mg  650 mg Oral Q6H PRN Ron Parker, MD       Or  . acetaminophen (TYLENOL) suppository 650 mg  650 mg Rectal Q6H PRN Ron Parker, MD      . alum & mag hydroxide-simeth (MAALOX/MYLANTA) 200-200-20 MG/5ML suspension 30 mL  30 mL Oral Q6H PRN Ron Parker, MD      . amLODipine (NORVASC) tablet 10 mg  10 mg Oral Daily Marykay Lex, MD      . apixaban Everlene Balls) tablet 5 mg  5 mg Oral BID Abelino Derrick, PA-C   5 mg at 10/12/12 2026  . aspirin EC tablet 81 mg  81 mg Oral Daily Ron Parker, MD   81 mg at 10/12/12 1052  . atorvastatin (LIPITOR) tablet 40 mg  40 mg Oral Daily Ron Parker, MD   40 mg at 10/12/12 1052  . furosemide (LASIX) tablet 40 mg  40 mg Oral Daily Luke K Kilroy, PA-C      . hydrALAZINE (APRESOLINE) injection 10 mg  10 mg Intravenous Q10 min PRN Marykay Lex, MD      . HYDROmorphone (DILAUDID) injection 0.5-1 mg  0.5-1 mg Intravenous Q3H PRN Ron Parker, MD      . irbesartan (AVAPRO) tablet 300 mg  300 mg Oral Daily Luke K Kilroy, PA-C      . levothyroxine (SYNTHROID,  LEVOTHROID) tablet 50 mcg  50 mcg Oral QAC breakfast Ron Parker, MD   50 mcg at 10/13/12 4540  . nitroGLYCERIN (NITROSTAT) SL tablet 0.4 mg  0.4 mg Sublingual Q5 Min x 2 PRN Gavin Pound. Ghim, MD      . ondansetron (ZOFRAN) tablet 4 mg  4 mg Oral Q6H PRN Ron Parker, MD       Or  . ondansetron (ZOFRAN) injection 4 mg  4 mg Intravenous Q6H PRN Ron Parker, MD      . oxyCODONE (Oxy IR/ROXICODONE) immediate release tablet 5 mg  5 mg Oral Q4H PRN Ron Parker, MD      . pantoprazole (PROTONIX) EC tablet 40 mg  40 mg Oral Daily Ron Parker, MD   40 mg at 10/12/12 1052  . sodium chloride 0.9 % injection 3 mL  3 mL Intravenous Q12H Marykay Lex, MD   3 mL at 10/12/12 2315  . sodium chloride 0.9 % injection 3 mL  3 mL Intravenous PRN Marykay Lex, MD      . traMADol Janean Sark) tablet 50 mg  50 mg Oral Q6H PRN  Roma Kayser Schorr, NP   50 mg at 10/13/12 1610  . zolpidem (AMBIEN) tablet 5 mg  5 mg Oral QHS PRN Ron Parker, MD        PE: General appearance: alert, cooperative and no distress Lungs: clear to auscultation bilaterally Heart: regular rate and rhythm, S1, S2 normal, no murmur, click, rub or gallop Extremities: no LEE, the right groin is free of hematoma and bruit Pulses: 2+ and symmetric Skin: warm and dry Neurologic: Grossly normal  Lab Results:   Recent Labs  10/10/12 2037 10/11/12 0450 10/12/12 0612  WBC 5.0 6.0 4.6  HGB 12.8 11.6* 12.9  HCT 37.5 36.5 39.0  PLT 199 182 192   BMET  Recent Labs  10/10/12 2037 10/11/12 0450 10/12/12 0612  NA 139 142 144  K 4.1 3.8 4.0  CL 105 111 111  CO2 23 24 26   GLUCOSE 105* 104* 90  BUN 23 20 19   CREATININE 1.06 1.12* 1.15*  CALCIUM 9.8 9.8 9.8   PT/INR  Recent Labs  10/12/12 0612  LABPROT 15.3*  INR 1.24   Cholesterol No results found for this basename: CHOL,  in the last 72 hours Cardiac Enzymes No components found with this basename: TROPONIN,  CKMB,    Studies/Results: POST-OPERATIVE DIAGNOSIS:  Moderate to severe native coronary artery disease in very small vessels. No clear-cut culprit lesion for the patient's resting chest pain. The radial graft to OM1-OM 2 does appear to be significantly atretic and small in caliber, however even the brisk flow in the native vessels, this is likely due to significant competitive flow. It is unlikely that the existing lesion in OM1 would cause resting chest pain.  The previous PTCA and PCI site of the RCA is widely patent, LIMA is widely patent.  Systemic hypertension, but mildly elevated LVEDP. Normal EF, with no regional wall motion abnormalities.  Essentially, despite the existence of disease in the circumflex system, these vessels are at best 2 mm in diameter, and the most significant lesion is the ostial OM1 lesion which would only be a PTCA candidate and not PCI due to the proximity to the parent Circumflex.     Assessment/Plan  Principal Problem:   Chest pain with moderate risk for cardiac etiology Active Problems:   Hypertension   Hx of CABG X 4 3/06-    Hyperlipidemia   RCA DES placed 7/12- patent 10/12   PAF - recurrent   Chronic kidney disease, stage 3   AVB - beta blocker and Amiodarone stopped   Chronic anticoagulation- Eloquis started and Plavix stopped 10/07/12   First degree atrioventricular block  Plan: s/p LHC yesterday, which revealed patent grafts and moderate to severe native coronary artery disease in very small vessels. No clear-cut culprit lesion for the patient's resting chest pain. Continue with medical therapy. Will add Imdur, 30 mg daily. HR and BP stable. SCr is 1.12. The right groin is stable. Plan for discharge home today.      LOS: 3 days    Stephanie Atkinson 10/13/2012 8:28 AM  I have seen and examined the patient along with Stephanie M. Sharol Harness, PA-C.  I have reviewed the chart, notes and new data.  I agree with PA's note.  Key new complaints:  asymptomatic Key examination changes: groin site healthy, lungs clear, normal pulses, no edema, JVD or gallop Key new findings / data: creat at baseline  PLAN: May have angina secondary to small branch obstruction. Add daily long acting nitrates, maybe  Ranexa if that doesn't work. Continue PPI, may take H2 blocker prn as well. Instructed in appropriate use of SL NTG if symptoms return.  Thurmon Fair, MD, [facility] [phone] 10/13/2012, 8:38 AM

## 2012-10-13 NOTE — ED Notes (Signed)
Pt states that she got in car with daughter after being discharged today with chest pain.  Pt felt real faint, nauseated, had neck and stomach pain.  Pt states when faint vision got blurriness.  Pt feels weak all over

## 2012-10-13 NOTE — ED Notes (Signed)
Pt was getting EKG and eyes rolled back and patient was diaphoretic and unresponsive and appeared to stop breathing.  Pt was rushed to Trauma room and pulses not felt at triage.  Pt regained consciousness and MD to bedside

## 2012-10-13 NOTE — ED Notes (Signed)
Admitting MD at the bedside.  

## 2012-10-14 ENCOUNTER — Observation Stay (HOSPITAL_COMMUNITY): Payer: Medicare Other

## 2012-10-14 DIAGNOSIS — I4891 Unspecified atrial fibrillation: Secondary | ICD-10-CM | POA: Diagnosis not present

## 2012-10-14 DIAGNOSIS — Z951 Presence of aortocoronary bypass graft: Secondary | ICD-10-CM | POA: Diagnosis not present

## 2012-10-14 DIAGNOSIS — I498 Other specified cardiac arrhythmias: Secondary | ICD-10-CM | POA: Diagnosis not present

## 2012-10-14 DIAGNOSIS — I440 Atrioventricular block, first degree: Secondary | ICD-10-CM | POA: Diagnosis not present

## 2012-10-14 DIAGNOSIS — E860 Dehydration: Secondary | ICD-10-CM | POA: Diagnosis present

## 2012-10-14 DIAGNOSIS — R55 Syncope and collapse: Secondary | ICD-10-CM | POA: Diagnosis not present

## 2012-10-14 DIAGNOSIS — N390 Urinary tract infection, site not specified: Secondary | ICD-10-CM | POA: Diagnosis not present

## 2012-10-14 DIAGNOSIS — J9819 Other pulmonary collapse: Secondary | ICD-10-CM | POA: Diagnosis not present

## 2012-10-14 LAB — CBC
HCT: 38 % (ref 36.0–46.0)
Hemoglobin: 11.4 g/dL — ABNORMAL LOW (ref 12.0–15.0)
MCH: 26.8 pg (ref 26.0–34.0)
MCHC: 31.8 g/dL (ref 30.0–36.0)
MCHC: 32.6 g/dL (ref 30.0–36.0)
MCV: 84.4 fL (ref 78.0–100.0)
Platelets: 178 10*3/uL (ref 150–400)
Platelets: 199 10*3/uL (ref 150–400)
RDW: 13.3 % (ref 11.5–15.5)
WBC: 4.3 10*3/uL (ref 4.0–10.5)

## 2012-10-14 LAB — URINALYSIS, ROUTINE W REFLEX MICROSCOPIC
Bilirubin Urine: NEGATIVE
Nitrite: POSITIVE — AB

## 2012-10-14 LAB — URINE MICROSCOPIC-ADD ON

## 2012-10-14 LAB — BASIC METABOLIC PANEL
BUN: 17 mg/dL (ref 6–23)
Calcium: 9.6 mg/dL (ref 8.4–10.5)
GFR calc non Af Amer: 50 mL/min — ABNORMAL LOW (ref >90)
Glucose, Bld: 97 mg/dL (ref 70–99)

## 2012-10-14 MED ORDER — CHLORHEXIDINE GLUCONATE 4 % EX LIQD
60.0000 mL | Freq: Once | CUTANEOUS | Status: AC
  Administered 2012-10-15: 4 via TOPICAL
  Filled 2012-10-14: qty 60

## 2012-10-14 MED ORDER — SODIUM CHLORIDE 0.9 % IV SOLN
INTRAVENOUS | Status: DC
  Administered 2012-10-15: 06:00:00 via INTRAVENOUS

## 2012-10-14 MED ORDER — CHLORHEXIDINE GLUCONATE 4 % EX LIQD
60.0000 mL | Freq: Once | CUTANEOUS | Status: AC
  Administered 2012-10-14: 4 via TOPICAL
  Filled 2012-10-14: qty 60

## 2012-10-14 MED ORDER — VANCOMYCIN HCL IN DEXTROSE 1-5 GM/200ML-% IV SOLN
1000.0000 mg | INTRAVENOUS | Status: DC
  Filled 2012-10-14: qty 200

## 2012-10-14 MED ORDER — SODIUM CHLORIDE 0.45 % IV SOLN
INTRAVENOUS | Status: DC
  Administered 2012-10-14: 08:00:00 via INTRAVENOUS

## 2012-10-14 MED ORDER — SODIUM CHLORIDE 0.9 % IR SOLN
80.0000 mg | Status: DC
  Filled 2012-10-14 (×4): qty 2

## 2012-10-14 NOTE — Progress Notes (Signed)
Pt. Seen and examined. Agree with the NP/PA-C note as written.  Agree that episode sounds mostly like neurocardiogenic syncope. With a-fib, arrythmia cannot be ruled out. No events on telemetry overnight. I agree that a Linq monitor would be helpful and she is agreeable to this. Transfer to telemetry today and arrange for Linq monitor placement with Dr. Salena Saner tomorrow (will need pre-pacer orders).  Chrystie Nose, MD, [facility] Attending Cardiologist [facility]

## 2012-10-14 NOTE — Progress Notes (Signed)
[facility]  Subjective: No complaints. Feels "Great". Denies any further syncope/near syncope.   Objective: Vital signs in last 24 hours: Temp:  [97.9 F (36.6 C)-98.6 F (37 C)] 98 F (36.7 C) (08/20 0813) Pulse Rate:  [63-80] 75 (08/20 0815) Resp:  [13-23] 13 (08/20 0815) BP: (92-158)/(28-111) 151/62 mmHg (08/20 0815) SpO2:  [92 %-99 %] 98 % (08/20 0815) Weight:  [153 lb 3.5 oz (69.5 kg)] 153 lb 3.5 oz (69.5 kg) (08/19 1650)    Intake/Output from previous day: 08/19 0701 - 08/20 0700 In: 825 [P.O.:150; I.V.:675] Out: -  Intake/Output this shift:    Medications Current Facility-Administered Medications  Medication Dose Route Frequency Provider Last Rate Last Dose  . 0.45 % sodium chloride infusion   Intravenous Continuous Russella Dar, NP 75 mL/hr at 10/14/12 0806    . acetaminophen (TYLENOL) tablet 650 mg  650 mg Oral Q4H PRN Renae Fickle, MD      . alum & mag hydroxide-simeth (MAALOX/MYLANTA) 200-200-20 MG/5ML suspension 15 mL  15 mL Oral Q4H PRN Renae Fickle, MD      . amLODipine (NORVASC) tablet 5 mg  5 mg Oral Daily Renae Fickle, MD      . atorvastatin (LIPITOR) tablet 40 mg  40 mg Oral Daily Renae Fickle, MD      . irbesartan (AVAPRO) tablet 300 mg  300 mg Oral Daily Renae Fickle, MD      . levothyroxine (SYNTHROID, LEVOTHROID) tablet 50 mcg  50 mcg Oral QAC breakfast Renae Fickle, MD      . nitroGLYCERIN (NITROSTAT) SL tablet 0.4 mg  0.4 mg Sublingual Q5 min PRN Renae Fickle, MD      . pantoprazole (PROTONIX) EC tablet 40 mg  40 mg Oral Daily Renae Fickle, MD      . sodium chloride 0.9 % injection 3 mL  3 mL Intravenous Q12H Renae Fickle, MD      . traMADol Janean Sark) tablet 50 mg  50 mg Oral Q6H PRN Renae Fickle, MD   50 mg at 10/13/12 2240    PE: General appearance: alert, cooperative and no distress Lungs: clear to auscultation bilaterally Heart: regular rate and rhythm Extremities: no LEE Pulses:  2+ and symmetric Skin: warm and dry Neurologic: Grossly normal  Lab Results:   Recent Labs  10/13/12 1220 10/13/12 1244 10/13/12 1834 10/14/12 0551  WBC 8.2  --  5.8 4.7  HGB 13.5 14.3 12.3 11.4*  HCT 40.7 42.0 37.3 35.8*  PLT 215  --  169 178   BMET  Recent Labs  10/12/12 0612 10/13/12 1227 10/13/12 1244 10/14/12 0551  NA 144 140 141 149*  K 4.0 4.7 4.2 4.1  CL 111 106 108 116*  CO2 26 23  --  23  GLUCOSE 90 107* 104* 97  BUN 19 21 25* 17  CREATININE 1.15* 1.29* 1.40* 1.00  CALCIUM 9.8 9.9  --  9.6   PT/INR  Recent Labs  10/12/12 0612 10/13/12 1227  LABPROT 15.3* 17.2*  INR 1.24 1.44    Assessment/Plan  Principal Problem:   Syncope and collapse Active Problems:   Hypertension   Hx of CABG X 4 3/06-    Hyperlipidemia   RCA DES placed 7/12- patent 10/12   PAF - recurrent   Chronic kidney disease, stage 3   AVB - beta blocker and Amiodarone stopped   Chronic anticoagulation- Eloquis started and Plavix stopped 10/07/12   First degree atrioventricular block  Plan: No arrhthymias noted  on telemetry since readmission. However she does have known 1st degree AV block. No further syncope. HR and BP both stable. ? Insertion of loop recorder tomorrow. MD to follow.    LOS: 1 day    Estus Krakowski M. Delmer Islam 10/14/2012 8:47 AM

## 2012-10-14 NOTE — Progress Notes (Signed)
Transferred to [id number] by wheelchair , stable, report given to RN, belongings with family.

## 2012-10-14 NOTE — Progress Notes (Signed)
Orthostatic v/s: Lying-bp- 150/44, hr 73, r- 16 Sitting- bp 159/102,hr-74,r- 15 Sitting bp-151/62, hr- 77, r- 15 denied any dizziness.

## 2012-10-14 NOTE — Progress Notes (Signed)
[facility] Progress Note [place] TEAM 1 - Stepdown ICU Team   Stephanie Atkinson AVW:[id number] DOB: 02-07-1925 DOA: 10/13/2012 PCP: Thayer Headings, MD  Brief narrative: 79 year old female with known history of CAD recently admitted to this facility on 10/11/2012 with chest pain. Underwent cardiac catheterization during that admission and found to have patent bypass grafts. During that admission she was started on Imdur 30 mg daily. She was discharged home on the morning of 10/13/2012. Before arriving to her home she developed blurry vision with flushing of the skin and then partial unresponsiveness with her head hanging down. She had associated nausea and pallor. She had no chest pain or shortness of breath. Her daughter brought her back to the emergency department. In the triage area the patient apparently had another syncopal episode and was described as pulseless but spontaneously recovered without any chest compressions being delivered or any assisted ventilation. The patient subsequently reported that the right side of her lip was normal and did not feel right. She apparently has chronic numbness of the right arm and leg. Additional workup in the ER revealed some mild hemoconcentration with hemoglobin of 14 as compared to 12 the day prior. Her creatinine was also slightly elevated at 1.4 as compared to 1.15 at time of previous admission. CT abdomen and pelvis did not reveal any evidence of retroperitoneal hematoma. EKG demonstrated atrial fibrillation with new T-wave inversion in lead V2 but otherwise stable.  Assessment/Plan:    PAF - recurrent with History of first degree atrioventricular block -concern may have had bradycardia as etiology of syncope -Cards following and plans loop recorder implant 8/21 -tele here has been unremarkable so far    Syncope and collapse -?due to orthostasis vs arrhythmia -Cards suspicious likely neurocardiogenic syndrome -Orthostatic vital signs checked  today after hydration and are normal -Suspect may have had orthostasis pre-admit in setting of initiation of Imdur during last admission    Hypertension -Currently normotensive on Avapro and Norvasc    Chronic kidney disease, stage 3 -Creatinine back to baseline with baseline GFR and BUN after hydration    Dehydration -Currently normotensive and renal function has returned to baseline so we'll KVO IV fluid    Hx of CABG X 4 3/06/recent stable coronary angiogram August 2014 -Currently chest pain-free   Hypothyroidism -Continue Synthroid   Hyperlipidemia   Chronic anticoagulation - Eloquis started and Plavix stopped 10/07/12   DVT prophylaxis: SCDs Code Status: Full Family Communication: Patient Disposition Plan/Expected LOS: Transfer to telemetry-plans are for implantation of loop recorder 10/15/2012  Consultants: Cardiology  Procedures: Implantation of loop recorder - pending  Antibiotics: None   HPI/Subjective: Patient alert and currently denies any dizziness, chest pain or shortness of breath. States when normally at home she eats well and drinks adequate amounts of fluid. 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  Objective: Blood pressure 138/50, pulse 79, temperature 98.2 F (36.8 C), temperature source Other (Comment), resp. rate 20, height 5\' 4"  (1.626 m), weight 69.5 kg (153 lb 3.5 oz), SpO2 99.00%.  Intake/Output Summary (Last 24 hours) at 10/14/12 1302 Last data filed at 10/14/12 1300  Gross per 24 hour  Intake   1575 ml  Output      0 ml  Net   1575 ml    Exam: General: No acute respiratory distress Lungs: Clear to auscultation bilaterally without wheezes or crackles, RA Cardiovascular: Regular rate and first degree block rhythm without murmur gallop or rub normal S1 and S2, no peripheral edema or  JVD-not orthostatic when checked Abdomen: Nontender, nondistended, soft, bowel sounds positive, no rebound, no ascites, no appreciable  mass Musculoskeletal: No significant cyanosis, clubbing of bilateral lower extremities Neurological: Alert and oriented x 3, moves all extremities x 4 without focal neurological deficits, CN 2-12 intact  Scheduled Meds:  Scheduled Meds: . amLODipine  5 mg Oral Daily  . atorvastatin  40 mg Oral Daily  . irbesartan  300 mg Oral Daily  . levothyroxine  50 mcg Oral QAC breakfast  . pantoprazole  40 mg Oral Daily  . sodium chloride  3 mL Intravenous Q12H   Data Reviewed: Basic Metabolic Panel:  Recent Labs Lab 10/10/12 2037 10/11/12 0450 10/12/12 0612 10/13/12 1227 10/13/12 1244 10/14/12 0551  NA 139 142 144 140 141 149*  K 4.1 3.8 4.0 4.7 4.2 4.1  CL 105 111 111 106 108 116*  CO2 23 24 26 23   --  23  GLUCOSE 105* 104* 90 107* 104* 97  BUN 23 20 19 21  25* 17  CREATININE 1.06 1.12* 1.15* 1.29* 1.40* 1.00  CALCIUM 9.8 9.8 9.8 9.9  --  9.6   Liver Function Tests:  Recent Labs Lab 10/13/12 1227  AST 34  ALT 28  ALKPHOS 120*  BILITOT 0.7  PROT 5.9*  ALBUMIN 3.4*    Recent Labs Lab 10/13/12 1227  LIPASE 39   CBC:  Recent Labs Lab 10/11/12 0450 10/12/12 0612 10/13/12 1220 10/13/12 1244 10/13/12 1834 10/14/12 0551  WBC 6.0 4.6 8.2  --  5.8 4.7  NEUTROABS  --   --   --   --  3.7  --   HGB 11.6* 12.9 13.5 14.3 12.3 11.4*  HCT 36.5 39.0 40.7 42.0 37.3 35.8*  MCV 84.9 83.3 85.0  --  83.8 84.0  PLT 182 192 215  --  169 178   Cardiac Enzymes:  Recent Labs Lab 10/11/12 0741 10/11/12 1148 10/13/12 1838 10/13/12 2225 10/14/12 0551  TROPONINI <0.30 <0.30 <0.30 <0.30 <0.30   CBG:  Recent Labs Lab 10/13/12 1219  GLUCAP 94    Recent Results (from the past 240 hour(s))  MRSA PCR SCREENING     Status: None   Collection Time    10/13/12  4:37 PM      Result Value Range Status   MRSA by PCR NEGATIVE  NEGATIVE Final   Comment:            The GeneXpert MRSA Assay (FDA     approved for NASAL specimens     only), is one component of a     comprehensive  MRSA colonization     surveillance program. It is not     intended to diagnose MRSA     infection nor to guide or     monitor treatment for     MRSA infections.     Studies:  Recent x-ray studies have been reviewed in detail by the Attending Physician  Junious Silk, ANP [facility] Office  [phone] Pager [phone]  **If unable to reach the above provider after paging please contact the Flow Manager @ [phone]  On-Call/Text Page:      [email or web address]      password [email or web address]  If 7PM-7AM, please contact night-coverage [email or web address] Password [email or web address] 10/14/2012, 1:02 PM   LOS: 1 day   I have personally examined this patient and reviewed the entire database. I have reviewed the above note, made any necessary editorial changes, and agree with its content.  Lonia Blood, MD [facility]

## 2012-10-14 NOTE — Progress Notes (Signed)
MD made aware of the result of urinalysis, no order given.

## 2012-10-15 ENCOUNTER — Encounter (HOSPITAL_COMMUNITY)
Admission: EM | Disposition: A | Discharge status: Home / Self Care | Payer: Self-pay | Source: Home / Self Care | Attending: Internal Medicine

## 2012-10-15 DIAGNOSIS — R55 Syncope and collapse: Secondary | ICD-10-CM | POA: Diagnosis not present

## 2012-10-15 DIAGNOSIS — I10 Essential (primary) hypertension: Secondary | ICD-10-CM | POA: Diagnosis not present

## 2012-10-15 DIAGNOSIS — I4891 Unspecified atrial fibrillation: Secondary | ICD-10-CM | POA: Diagnosis not present

## 2012-10-15 HISTORY — PX: LOOP RECORDER IMPLANT: SHX5477

## 2012-10-15 LAB — URINE CULTURE: Colony Count: >=100000

## 2012-10-15 LAB — CBC
HCT: 34.7 % — ABNORMAL LOW (ref 36.0–46.0)
Hemoglobin: 11.5 g/dL — ABNORMAL LOW (ref 12.0–15.0)
MCH: 27.8 pg (ref 26.0–34.0)
MCHC: 33.1 g/dL (ref 30.0–36.0)
RBC: 4.14 MIL/uL (ref 3.87–5.11)

## 2012-10-15 LAB — BASIC METABOLIC PANEL
BUN: 18 mg/dL (ref 6–23)
Chloride: 110 mEq/L (ref 96–112)
Glucose, Bld: 95 mg/dL (ref 70–99)
Potassium: 3.8 mEq/L (ref 3.5–5.1)
Sodium: 141 mEq/L (ref 135–145)

## 2012-10-15 LAB — SURGICAL PCR SCREEN: Staphylococcus aureus: NEGATIVE

## 2012-10-15 SURGERY — LOOP RECORDER IMPLANT
Anesthesia: LOCAL

## 2012-10-15 MED ORDER — NITROFURANTOIN MACROCRYSTAL 100 MG PO CAPS
100.0000 mg | ORAL_CAPSULE | Freq: Two times a day (BID) | ORAL | Status: DC
  Administered 2012-10-15: 100 mg via ORAL
  Filled 2012-10-15 (×2): qty 1

## 2012-10-15 MED ORDER — NITROFURANTOIN MACROCRYSTAL 100 MG PO CAPS: 100.0000 mg | ORAL_CAPSULE | Freq: Two times a day (BID) | ORAL | Status: DC

## 2012-10-15 MED ORDER — LIDOCAINE HCL (PF) 1 % IJ SOLN
INTRAMUSCULAR | Status: AC
  Filled 2012-10-15: qty 30

## 2012-10-15 MED ORDER — DIPHENHYDRAMINE HCL 12.5 MG/5ML PO ELIX
12.5000 mg | ORAL_SOLUTION | Freq: Once | ORAL | Status: AC
  Administered 2012-10-15: 12.5 mg via ORAL
  Filled 2012-10-15: qty 5

## 2012-10-15 NOTE — Progress Notes (Signed)
[facility]  DAILY PROGRESS NOTE   Subjective:  No chest pain or syncope overnight  Objective:  Temp:  [97.5 F (36.4 C)-98.2 F (36.8 C)] 98.1 F (36.7 C) (08/21 0436) Pulse Rate:  [62-79] 62 (08/21 0436) Resp:  [18-20] 18 (08/21 0436) BP: (134-174)/(43-56) 134/43 mmHg (08/21 0436) SpO2:  [99 %-100 %] 100 % (08/21 0436) Weight:  [155 lb 8 oz (70.534 kg)] 155 lb 8 oz (70.534 kg) (08/20 1457) Weight change: 2 lb 4.5 oz (1.034 kg)  Intake/Output from previous day: 08/20 0701 - 08/21 0700 In: 1290 [P.O.:780; I.V.:510] Out: -   Intake/Output from this shift:    Medications: Current Facility-Administered Medications  Medication Dose Route Frequency Provider Last Rate Last Dose  . 0.45 % sodium chloride infusion   Intravenous Continuous Russella Dar, NP 10 mL/hr at 10/14/12 1500    . acetaminophen (TYLENOL) tablet 650 mg  650 mg Oral Q4H PRN Renae Fickle, MD      . alum & mag hydroxide-simeth (MAALOX/MYLANTA) 200-200-20 MG/5ML suspension 15 mL  15 mL Oral Q4H PRN Renae Fickle, MD      . amLODipine (NORVASC) tablet 5 mg  5 mg Oral Daily Renae Fickle, MD   5 mg at 10/14/12 0900  . atorvastatin (LIPITOR) tablet 40 mg  40 mg Oral Daily Renae Fickle, MD   40 mg at 10/14/12 0900  . irbesartan (AVAPRO) tablet 300 mg  300 mg Oral Daily Renae Fickle, MD   300 mg at 10/14/12 0900  . levothyroxine (SYNTHROID, LEVOTHROID) tablet 50 mcg  50 mcg Oral QAC breakfast Renae Fickle, MD   50 mcg at 10/14/12 0900  . nitroGLYCERIN (NITROSTAT) SL tablet 0.4 mg  0.4 mg Sublingual Q5 min PRN Renae Fickle, MD      . pantoprazole (PROTONIX) EC tablet 40 mg  40 mg Oral Daily Renae Fickle, MD   40 mg at 10/14/12 0900  . sodium chloride 0.9 % injection 3 mL  3 mL Intravenous Q12H Renae Fickle, MD   3 mL at 10/14/12 0901  . traMADol (ULTRAM) tablet 50 mg  50 mg Oral Q6H PRN Renae Fickle, MD   50 mg at 10/14/12 2001    Physical Exam: General  appearance: alert, cooperative and no distress Neck: no adenopathy, no carotid bruit, no JVD, supple, symmetrical, trachea midline and thyroid not enlarged, symmetric, no tenderness/mass/nodules Lungs: clear to auscultation bilaterally Heart: regular rate and rhythm, S1, S2 normal, no murmur, click, rub or gallop Abdomen: soft, non-tender; bowel sounds normal; no masses,  no organomegaly Extremities: extremities normal, atraumatic, no cyanosis or edema Pulses: 2+ and symmetric Skin: Skin color, texture, turgor normal. No rashes or lesions Neurologic: Alert and oriented X 3, normal strength and tone. Normal symmetric reflexes. Normal coordination and gait  Lab Results: Results for orders placed during the hospital encounter of 10/13/12 (from the past 48 hour(s))  GLUCOSE, CAPILLARY     Status: None   Collection Time    10/13/12 12:19 PM      Result Value Range   Glucose-Capillary 94  70 - 99 mg/dL  CBC     Status: None   Collection Time    10/13/12 12:20 PM      Result Value Range   WBC 8.2  4.0 - 10.5 K/uL   RBC 4.79  3.87 - 5.11 MIL/uL   Hemoglobin 13.5  12.0 - 15.0 g/dL   HCT 16.1  09.6 - 04.5 %   MCV 85.0  78.0 - 100.0 fL   MCH 28.2  26.0 - 34.0 pg   MCHC 33.2  30.0 - 36.0 g/dL   RDW 16.1  09.6 - 04.5 %   Platelets 215  150 - 400 K/uL  COMPREHENSIVE METABOLIC PANEL     Status: Abnormal   Collection Time    10/13/12 12:27 PM      Result Value Range   Sodium 140  135 - 145 mEq/L   Potassium 4.7  3.5 - 5.1 mEq/L   Comment: HEMOLYZED SPECIMEN, RESULTS MAY BE AFFECTED   Chloride 106  96 - 112 mEq/L   CO2 23  19 - 32 mEq/L   Glucose, Bld 107 (*) 70 - 99 mg/dL   BUN 21  6 - 23 mg/dL   Creatinine, Ser 4.09 (*) 0.50 - 1.10 mg/dL   Calcium 9.9  8.4 - 81.1 mg/dL   Total Protein 5.9 (*) 6.0 - 8.3 g/dL   Albumin 3.4 (*) 3.5 - 5.2 g/dL   AST 34  0 - 37 U/L   ALT 28  0 - 35 U/L   Alkaline Phosphatase 120 (*) 39 - 117 U/L   Total Bilirubin 0.7  0.3 - 1.2 mg/dL   GFR calc non Af  Amer 36 (*) >90 mL/min   GFR calc Af Amer 42 (*) >90 mL/min   Comment: (NOTE)     The eGFR has been calculated using the CKD EPI equation.     This calculation has not been validated in all clinical situations.     eGFR's persistently <90 mL/min signify possible Chronic Kidney     Disease.  PROTIME-INR     Status: Abnormal   Collection Time    10/13/12 12:27 PM      Result Value Range   Prothrombin Time 17.2 (*) 11.6 - 15.2 seconds   INR 1.44  0.00 - 1.49  LIPASE, BLOOD     Status: None   Collection Time    10/13/12 12:27 PM      Result Value Range   Lipase 39  11 - 59 U/L  TYPE AND SCREEN     Status: None   Collection Time    10/13/12 12:30 PM      Result Value Range   ABO/RH(D) B POS     Antibody Screen NEG     Sample Expiration 10/16/2012    ABO/RH     Status: None   Collection Time    10/13/12 12:30 PM      Result Value Range   ABO/RH(D) B POS    POCT I-STAT TROPONIN I     Status: None   Collection Time    10/13/12 12:42 PM      Result Value Range   Troponin i, poc 0.05  0.00 - 0.08 ng/mL   Comment 3            Comment: Due to the release kinetics of cTnI,     a negative result within the first hours     of the onset of symptoms does not rule out     myocardial infarction with certainty.     If myocardial infarction is still suspected,     repeat the test at appropriate intervals.  POCT I-STAT, CHEM 8     Status: Abnormal   Collection Time    10/13/12 12:44 PM      Result Value Range   Sodium 141  135 - 145 mEq/L   Potassium 4.2  3.5 -  5.1 mEq/L   Chloride 108  96 - 112 mEq/L   BUN 25 (*) 6 - 23 mg/dL   Creatinine, Ser 2.95 (*) 0.50 - 1.10 mg/dL   Glucose, Bld 621 (*) 70 - 99 mg/dL   Calcium, Ion 3.08  6.57 - 1.30 mmol/L   TCO2 22  0 - 100 mmol/L   Hemoglobin 14.3  12.0 - 15.0 g/dL   HCT 84.6  96.2 - 95.2 %  MRSA PCR SCREENING     Status: None   Collection Time    10/13/12  4:37 PM      Result Value Range   MRSA by PCR NEGATIVE  NEGATIVE   Comment:             The GeneXpert MRSA Assay (FDA     approved for NASAL specimens     only), is one component of a     comprehensive MRSA colonization     surveillance program. It is not     intended to diagnose MRSA     infection nor to guide or     monitor treatment for     MRSA infections.  CBC WITH DIFFERENTIAL     Status: None   Collection Time    10/13/12  6:34 PM      Result Value Range   WBC 5.8  4.0 - 10.5 K/uL   RBC 4.45  3.87 - 5.11 MIL/uL   Hemoglobin 12.3  12.0 - 15.0 g/dL   HCT 84.1  32.4 - 40.1 %   MCV 83.8  78.0 - 100.0 fL   MCH 27.6  26.0 - 34.0 pg   MCHC 33.0  30.0 - 36.0 g/dL   RDW 02.7  25.3 - 66.4 %   Platelets 169  150 - 400 K/uL   Neutrophils Relative % 64  43 - 77 %   Neutro Abs 3.7  1.7 - 7.7 K/uL   Lymphocytes Relative 24  12 - 46 %   Lymphs Abs 1.4  0.7 - 4.0 K/uL   Monocytes Relative 11  3 - 12 %   Monocytes Absolute 0.6  0.1 - 1.0 K/uL   Eosinophils Relative 0  0 - 5 %   Eosinophils Absolute 0.0  0.0 - 0.7 K/uL   Basophils Relative 0  0 - 1 %   Basophils Absolute 0.0  0.0 - 0.1 K/uL  TROPONIN I     Status: None   Collection Time    10/13/12  6:38 PM      Result Value Range   Troponin I <0.30  <0.30 ng/mL   Comment:            Due to the release kinetics of cTnI,     a negative result within the first hours     of the onset of symptoms does not rule out     myocardial infarction with certainty.     If myocardial infarction is still suspected,     repeat the test at appropriate intervals.  OCCULT BLOOD X 1 CARD TO LAB, STOOL     Status: None   Collection Time    10/13/12  7:40 PM      Result Value Range   Fecal Occult Bld NEGATIVE  NEGATIVE  TROPONIN I     Status: None   Collection Time    10/13/12 10:25 PM      Result Value Range   Troponin I <0.30  <0.30 ng/mL   Comment:  Due to the release kinetics of cTnI,     a negative result within the first hours     of the onset of symptoms does not rule out     myocardial infarction with  certainty.     If myocardial infarction is still suspected,     repeat the test at appropriate intervals.  TROPONIN I     Status: None   Collection Time    10/14/12  5:51 AM      Result Value Range   Troponin I <0.30  <0.30 ng/mL   Comment:            Due to the release kinetics of cTnI,     a negative result within the first hours     of the onset of symptoms does not rule out     myocardial infarction with certainty.     If myocardial infarction is still suspected,     repeat the test at appropriate intervals.  BASIC METABOLIC PANEL     Status: Abnormal   Collection Time    10/14/12  5:51 AM      Result Value Range   Sodium 149 (*) 135 - 145 mEq/L   Comment: DELTA CHECK NOTED   Potassium 4.1  3.5 - 5.1 mEq/L   Chloride 116 (*) 96 - 112 mEq/L   CO2 23  19 - 32 mEq/L   Glucose, Bld 97  70 - 99 mg/dL   BUN 17  6 - 23 mg/dL   Creatinine, Ser 1.61  0.50 - 1.10 mg/dL   Calcium 9.6  8.4 - 09.6 mg/dL   GFR calc non Af Amer 50 (*) >90 mL/min   GFR calc Af Amer 57 (*) >90 mL/min   Comment: (NOTE)     The eGFR has been calculated using the CKD EPI equation.     This calculation has not been validated in all clinical situations.     eGFR's persistently <90 mL/min signify possible Chronic Kidney     Disease.  CBC     Status: Abnormal   Collection Time    10/14/12  5:51 AM      Result Value Range   WBC 4.7  4.0 - 10.5 K/uL   RBC 4.26  3.87 - 5.11 MIL/uL   Hemoglobin 11.4 (*) 12.0 - 15.0 g/dL   HCT 04.5 (*) 40.9 - 81.1 %   MCV 84.0  78.0 - 100.0 fL   MCH 26.8  26.0 - 34.0 pg   MCHC 31.8  30.0 - 36.0 g/dL   RDW 91.4  78.2 - 95.6 %   Platelets 178  150 - 400 K/uL  URINALYSIS, ROUTINE W REFLEX MICROSCOPIC     Status: Abnormal   Collection Time    10/14/12  9:22 AM      Result Value Range   Color, Urine YELLOW  YELLOW   APPearance CLOUDY (*) CLEAR   Specific Gravity, Urine 1.029  1.005 - 1.030   pH 6.0  5.0 - 8.0   Glucose, UA NEGATIVE  NEGATIVE mg/dL   Hgb urine dipstick  TRACE (*) NEGATIVE   Bilirubin Urine NEGATIVE  NEGATIVE   Ketones, ur NEGATIVE  NEGATIVE mg/dL   Protein, ur NEGATIVE  NEGATIVE mg/dL   Urobilinogen, UA 1.0  0.0 - 1.0 mg/dL   Nitrite POSITIVE (*) NEGATIVE   Leukocytes, UA MODERATE (*) NEGATIVE  URINE MICROSCOPIC-ADD ON     Status: Abnormal   Collection Time    10/14/12  9:22 AM      Result Value Range   Squamous Epithelial / LPF FEW (*) RARE   WBC, UA TOO NUMEROUS TO COUNT  <3 WBC/hpf   RBC / HPF 3-6  <3 RBC/hpf   Bacteria, UA MANY (*) RARE  URINE CULTURE     Status: None   Collection Time    10/14/12  9:22 AM      Result Value Range   Specimen Description URINE, RANDOM     Special Requests NONE     Culture  Setup Time       Value: 10/14/2012 10:37     Performed at [facility] Count       Value: >=100,000 COLONIES/ML     Performed at [facility]   Culture       Value: ESCHERICHIA COLI     Performed at [facility]   Report Status PENDING    CBC     Status: None   Collection Time    10/14/12  2:55 PM      Result Value Range   WBC 4.3  4.0 - 10.5 K/uL   RBC 4.50  3.87 - 5.11 MIL/uL   Hemoglobin 12.4  12.0 - 15.0 g/dL   HCT 29.5  28.4 - 13.2 %   MCV 84.4  78.0 - 100.0 fL   MCH 27.6  26.0 - 34.0 pg   MCHC 32.6  30.0 - 36.0 g/dL   RDW 44.0  10.2 - 72.5 %   Platelets 199  150 - 400 K/uL  BASIC METABOLIC PANEL     Status: Abnormal   Collection Time    10/15/12  4:25 AM      Result Value Range   Sodium 141  135 - 145 mEq/L   Comment: DELTA CHECK NOTED   Potassium 3.8  3.5 - 5.1 mEq/L   Chloride 110  96 - 112 mEq/L   CO2 23  19 - 32 mEq/L   Glucose, Bld 95  70 - 99 mg/dL   BUN 18  6 - 23 mg/dL   Creatinine, Ser 3.66  0.50 - 1.10 mg/dL   Calcium 9.7  8.4 - 44.0 mg/dL   GFR calc non Af Amer 50 (*) >90 mL/min   GFR calc Af Amer 58 (*) >90 mL/min   Comment: (NOTE)     The eGFR has been calculated using the CKD EPI equation.     This calculation has not been validated in all clinical  situations.     eGFR's persistently <90 mL/min signify possible Chronic Kidney     Disease.  CBC     Status: Abnormal   Collection Time    10/15/12  4:25 AM      Result Value Range   WBC 4.9  4.0 - 10.5 K/uL   RBC 4.14  3.87 - 5.11 MIL/uL   Hemoglobin 11.5 (*) 12.0 - 15.0 g/dL   HCT 34.7 (*) 42.5 - 95.6 %   MCV 83.8  78.0 - 100.0 fL   MCH 27.8  26.0 - 34.0 pg   MCHC 33.1  30.0 - 36.0 g/dL   RDW 38.7  56.4 - 33.2 %   Platelets 184  150 - 400 K/uL  SURGICAL PCR SCREEN     Status: None   Collection Time    10/15/12  5:45 AM      Result Value Range   MRSA, PCR NEGATIVE  NEGATIVE   Staphylococcus aureus NEGATIVE  NEGATIVE   Comment:            The Xpert SA Assay (FDA     approved for NASAL specimens     in patients over 38 years of age),     is one component of     a comprehensive surveillance     program.  Test performance has     been validated by [facility] for patients greater     than or equal to 20 year old.     It is not intended     to diagnose infection nor to     guide or monitor treatment.    Imaging: Ct Head Wo Contrast  10/14/2012   *RADIOLOGY REPORT*  Clinical Data: Recurrent syncope postcatheterization.  On anticoagulation.  CT HEAD WITHOUT CONTRAST  Technique:  Contiguous axial images were obtained from the base of the skull through the vertex without contrast.  Comparison: 01/01/2012  Findings: Mild cerebral atrophy and small vessel ischemic changes. The ventricles and sulci are symmetrical without significant effacement, displacement, or dilatation. No mass effect or midline shift. No abnormal extra-axial fluid collections. The grey-white matter junction is distinct. Basal cisterns are not effaced. No acute intracranial hemorrhage. No depressed skull fractures. Visualized paranasal sinuses and mastoids air cells are not significantly opacified.  No significant change since previous study.  IMPRESSION: No acute intracranial abnormalities.   Original Report  Authenticated By: Burman Nieves, M.D.   Ct Abdomen Pelvis W Contrast  10/13/2012   *RADIOLOGY REPORT*  Clinical Data: Epigastric pain syncope.  Rule out retroperitoneal bleed  CT ABDOMEN AND PELVIS WITH CONTRAST  Technique:  Multidetector CT imaging of the abdomen and pelvis was performed following the standard protocol during bolus administration of intravenous contrast.  Contrast: 80mL OMNIPAQUE IOHEXOL 300 MG/ML  SOLN  Comparison: CT 10/18/2010  Findings: The lung bases are clear.  Heart size is upper normal.  Prior cholecystectomy.  Bile ducts are not dilated.  No focal liver lesion.  Pancreas and spleen are normal.  Bilateral renal cyst.  No renal obstruction or mass.  Atherosclerotic aorta without aneurysm.  Negative for hematoma.  No free fluid.  Negative for bowel obstruction or bowel thickening.  Normal appendix.  Negative for mass or adenopathy.  IMPRESSION: No acute abnormality.  Negative for aortic aneurysm or hematoma.   Original Report Authenticated By: Janeece Riggers, M.D.   Dg Chest Port 1 View  10/13/2012   *RADIOLOGY REPORT*  Clinical Data: Near-syncope  PORTABLE CHEST - 1 VIEW  Comparison: 10/10/2012  Findings: Increased opacities noted the lung bases accentuated by lower lung volumes are seen on the prior study.  This is likely atelectasis. No evidence of pulmonary edema.  No pleural effusion or pneumothorax.  Changes from CABG surgery are stable.  There is mild cardiomegaly. No mediastinal or hilar masses are noted.  IMPRESSION: Mild lung base opacity accentuated by low lung volumes.  The opacities are most likely atelectasis.  Infiltrate should be considered in the proper clinical setting.  Mild cardiomegaly and changes from prior CABG surgery.   Original Report Authenticated By: Amie Portland, M.D.    Assessment:  1. Active Problems: 2.   Hypertension 3.   Hx of CABG X 4 3/06-  4.   Hyperlipidemia 5.   PAF - recurrent 6.   Chronic kidney disease, stage 3 7.   Chronic  anticoagulation- Eloquis started and Plavix stopped 10/07/12 8.   History of first degree atrioventricular block 9.  Syncope and collapse 10.   Dehydration 11.   Plan:  1. Loop recorder this AM. DC home later today  Time Spent Directly with Patient:  30 minutes  Length of Stay:  LOS: 2 days    Cing  10/15/2012, 9:22 AM

## 2012-10-15 NOTE — Discharge Summary (Signed)
Physician Discharge Summary  MANNA GOSE AOZ:[id number] DOB: 1924/09/26 DOA: 10/13/2012  PCP: Thayer Headings, MD  Admit date: 10/13/2012 Discharge date: 10/15/2012  Time spent: 30 minutes  Recommendations for Outpatient Follow-up:  1. Follow up with Cardiologist as directed 2. Please refer to discharge instructions from cardiology regarding loop recorder post procedure care and instructions  Discharge Diagnoses:  Active Problems:   PAF - recurrent-non documented this admission   History of first degree atrioventricular block   Syncope and collapse- etiology likely dehydration and new nitrate   Hypertension   Chronic kidney disease, stage 3   Dehydration   Hx of CABG X 4 3/06-    Hyperlipidemia   Chronic anticoagulation- Eliquis started and Plavix stopped 10/07/12   Discharge Condition: stable  Diet recommendation: Heart Healthy  Filed Weights   10/13/12 1650 10/14/12 1457  Weight: 69.5 kg (153 lb 3.5 oz) 70.534 kg (155 lb 8 oz)    History of present illness:  79 year old female with known history of CAD recently admitted to this facility on 10/11/2012 with chest pain. Underwent cardiac catheterization during that admission and found to have patent bypass grafts. During that admission she was started on Imdur 30 mg daily. She was discharged home on the morning of 10/13/2012. Before arriving to her home she developed blurry vision with flushing of the skin and then partial unresponsiveness with her head hanging down. She had associated nausea and pallor. She had no chest pain or shortness of breath. Her daughter brought her back to the emergency department. In the triage area the patient apparently had another syncopal episode and was described as pulseless but spontaneously recovered without any chest compressions being delivered or any assisted ventilation. The patient subsequently reported that the right side of her lip was normal and did not feel right. She apparently has chronic  numbness of the right arm and leg. Additional workup in the ER revealed some mild hemoconcentration with hemoglobin of 14 as compared to 12 the day prior. Her creatinine was also slightly elevated at 1.4 as compared to 1.15 at time of previous admission. CT abdomen and pelvis did not reveal any evidence of retroperitoneal hematoma. EKG demonstrated atrial fibrillation with new T-wave inversion in lead V2 but otherwise stable.   Hospital Course:  PAF - recurrent with History of first degree atrioventricular block  -concern may have had bradycardia as etiology of syncope  -Cards followed during the admission and loop recorder implanted 8/21  -telemetery here has been unremarkable this admission  Syncope and collapse  -likely due to orthostasis although arrhythmia not fully excluded -Cardiology was suspicious may have neurocardiogenic syndrome  -Orthostatic vital signs checked after hydration were normal  -Suspect she may have had orthostasis pre-admit in setting of initiation of Imdur during last admission therefore after discussion with Dr. Rubie Maid we have discontinued Imdur.  Hypertension  -Currently normotensive on Avapro and Norvasc   Chronic kidney disease, stage 3  -Creatinine back to baseline with baseline GFR and BUN after hydration   Dehydration  -Currently normotensive and renal function has returned to baseline  E. Coli UTI -last positive urine was November 2013 (E. Coli)- seen in ER and empirically treated with Cipro since PCN allergic with hives. Subsequent culture demonstrated fluoroquinolone resistance -current sensitivities not available but suspect will be the same -discussed with pharmacist and recommendation is to proceed with Macrodantin 100 mg BID and extend to a minimum course of 7 days to ensure eradication- GFR borderline for use of this medication but  choices are extremely limited  Hx of CABG X 4 3/06/recent stable coronary angiogram August 2014  -Currently chest  pain-free   Hypothyroidism  -Continue Synthroid   Hyperlipidemia  -continue Lipitor  Chronic anticoagulation  - Eliquis started and Plavix stopped 10/07/12   Procedures: Loop recorder implantation  10/15/2012   Consultations:  Cardiology  Discharge Exam: Filed Vitals:   10/15/12 0436  BP: 134/43  Pulse: 62  Temp: 98.1 F (36.7 C)  Resp: 18   General: No acute respiratory distress  Lungs: Clear to auscultation bilaterally without wheezes or crackles, RA  Cardiovascular: Regular rate and first degree block rhythm without murmur gallop or rub normal S1 and S2, no peripheral edema or JVD-not orthostatic when checked  Abdomen: Nontender, nondistended, soft, bowel sounds positive, no rebound, no ascites, no appreciable mass  Musculoskeletal: No significant cyanosis, clubbing of bilateral lower extremities  Neurological: Alert and oriented x 3, moves all extremities x 4 without focal neurological deficits, CN 2-12 intact     Discharge Instructions      Discharge Orders   Future Appointments Provider Department Dept Phone   10/20/2012 10:20 AM Abelino Derrick, [place] [facility] [place] [phone]   10/28/2012 10:00 AM Runell Gess, MD [facility] [place] [phone]   Future Orders Complete By Expires   Diet - low sodium heart healthy  As directed    Discharge wound care:  As directed    Comments:     Please see instructions from Cardiology fro care after loop recorder implantation   Increase activity slowly  As directed        Medication List    STOP taking these medications       isosorbide mononitrate 30 MG 24 hr tablet  Commonly known as:  IMDUR      TAKE these medications       acetaminophen 325 MG tablet  Commonly known as:  TYLENOL  Take 650 mg by mouth every 4 (four) hours as needed. For pain     alum & mag hydroxide-simeth 200-200-20 MG/5ML suspension  Commonly known as:  MAALOX/MYLANTA   Take 15 mLs by mouth every 2 (two) hours as needed.     amLODipine 5 MG tablet  Commonly known as:  NORVASC  Take 5 mg by mouth daily.     apixaban 5 MG Tabs tablet  Commonly known as:  ELIQUIS  Take 1 tablet (5 mg total) by mouth 2 (two) times daily.     aspirin 81 MG EC tablet  Take 1 tablet (81 mg total) by mouth daily.     atorvastatin 40 MG tablet  Commonly known as:  LIPITOR  Take 40 mg by mouth daily.     furosemide 40 MG tablet  Commonly known as:  LASIX  Take 40 mg by mouth daily as needed. For fluid retention     levothyroxine 50 MCG tablet  Commonly known as:  SYNTHROID, LEVOTHROID  Take 50 mcg by mouth daily.     nitrofurantoin 100 MG capsule  Commonly known as:  MACRODANTIN  Take 1 capsule (100 mg total) by mouth every 12 (twelve) hours.     nitroGLYCERIN 0.4 MG SL tablet  Commonly known as:  NITROSTAT  Place 0.4 mg under the tongue every 5 (five) minutes as needed. For chest pain     pantoprazole 40 MG tablet  Commonly known as:  PROTONIX  Take 40 mg by mouth daily.     traMADol  50 MG tablet  Commonly known as:  ULTRAM  Take 50 mg by mouth every 6 (six) hours as needed for pain.     valsartan 320 MG tablet  Commonly known as:  DIOVAN  Take 320 mg by mouth daily.       Allergies  Allergen Reactions  . Penicillins Hives  . Sulfa Antibiotics Swelling   Follow-up Information   Follow up with CROITORU,MIHAI, MD In 10 days.   Specialty:  Cardiology   Contact information:   [address] [place] [address] [phone]        The results of significant diagnostics from this hospitalization (including imaging, microbiology, ancillary and laboratory) are listed below for reference.    Significant Diagnostic Studies: Ct Head Wo Contrast  10/14/2012   *RADIOLOGY REPORT*  Clinical Data: Recurrent syncope postcatheterization.  On anticoagulation.  CT HEAD WITHOUT CONTRAST  Technique:  Contiguous axial images were obtained from  the base of the skull through the vertex without contrast.  Comparison: 01/01/2012  Findings: Mild cerebral atrophy and small vessel ischemic changes. The ventricles and sulci are symmetrical without significant effacement, displacement, or dilatation. No mass effect or midline shift. No abnormal extra-axial fluid collections. The grey-white matter junction is distinct. Basal cisterns are not effaced. No acute intracranial hemorrhage. No depressed skull fractures. Visualized paranasal sinuses and mastoids air cells are not significantly opacified.  No significant change since previous study.  IMPRESSION: No acute intracranial abnormalities.   Original Report Authenticated By: Burman Nieves, M.D.   Ct Abdomen Pelvis W Contrast  10/13/2012   *RADIOLOGY REPORT*  Clinical Data: Epigastric pain syncope.  Rule out retroperitoneal bleed  CT ABDOMEN AND PELVIS WITH CONTRAST  Technique:  Multidetector CT imaging of the abdomen and pelvis was performed following the standard protocol during bolus administration of intravenous contrast.  Contrast: 80mL OMNIPAQUE IOHEXOL 300 MG/ML  SOLN  Comparison: CT 10/18/2010  Findings: The lung bases are clear.  Heart size is upper normal.  Prior cholecystectomy.  Bile ducts are not dilated.  No focal liver lesion.  Pancreas and spleen are normal.  Bilateral renal cyst.  No renal obstruction or mass.  Atherosclerotic aorta without aneurysm.  Negative for hematoma.  No free fluid.  Negative for bowel obstruction or bowel thickening.  Normal appendix.  Negative for mass or adenopathy.  IMPRESSION: No acute abnormality.  Negative for aortic aneurysm or hematoma.   Original Report Authenticated By: Janeece Riggers, M.D.   Dg Chest Port 1 View  10/13/2012   *RADIOLOGY REPORT*  Clinical Data: Near-syncope  PORTABLE CHEST - 1 VIEW  Comparison: 10/10/2012  Findings: Increased opacities noted the lung bases accentuated by lower lung volumes are seen on the prior study.  This is likely  atelectasis. No evidence of pulmonary edema.  No pleural effusion or pneumothorax.  Changes from CABG surgery are stable.  There is mild cardiomegaly. No mediastinal or hilar masses are noted.  IMPRESSION: Mild lung base opacity accentuated by low lung volumes.  The opacities are most likely atelectasis.  Infiltrate should be considered in the proper clinical setting.  Mild cardiomegaly and changes from prior CABG surgery.   Original Report Authenticated By: Amie Portland, M.D.   Dg Chest Port 1 View  10/10/2012   *RADIOLOGY REPORT*  Clinical Data: 79 year old female shortness of breath and chest pain.  PORTABLE CHEST - 1 VIEW  Comparison: 01/01/2012 and earlier.  Findings: Portable upright AP view 2252 hours.  Mildly lower lung volumes. Stable cardiomegaly and  mediastinal contours.  Sequelae of CABG.  Allowing for portable technique, the lungs are clear.  IMPRESSION: No acute cardiopulmonary abnormality.   Original Report Authenticated By: Erskine Speed, M.D.    Microbiology: Recent Results (from the past 240 hour(s))  MRSA PCR SCREENING     Status: None   Collection Time    10/13/12  4:37 PM      Result Value Range Status   MRSA by PCR NEGATIVE  NEGATIVE Final   Comment:            The GeneXpert MRSA Assay (FDA     approved for NASAL specimens     only), is one component of a     comprehensive MRSA colonization     surveillance program. It is not     intended to diagnose MRSA     infection nor to guide or     monitor treatment for     MRSA infections.  URINE CULTURE     Status: None   Collection Time    10/14/12  9:22 AM      Result Value Range Status   Specimen Description URINE, RANDOM   Final   Special Requests NONE   Final   Culture  Setup Time     Final   Value: 10/14/2012 10:37     Performed at [facility] Count     Final   Value: >=100,000 COLONIES/ML     Performed at [facility]   Culture     Final   Value: ESCHERICHIA COLI     Performed at  [facility]   Report Status PENDING   Incomplete  SURGICAL PCR SCREEN     Status: None   Collection Time    10/15/12  5:45 AM      Result Value Range Status   MRSA, PCR NEGATIVE  NEGATIVE Final   Staphylococcus aureus NEGATIVE  NEGATIVE Final   Comment:            The Xpert SA Assay (FDA     approved for NASAL specimens     in patients over 94 years of age),     is one component of     a comprehensive surveillance     program.  Test performance has     been validated by [employer] for patients greater     than or equal to 30 year old.     It is not intended     to diagnose infection nor to     guide or monitor treatment.     Labs: Basic Metabolic Panel:  Recent Labs Lab 10/11/12 0450 10/12/12 0612 10/13/12 1227 10/13/12 1244 10/14/12 0551 10/15/12 0425  NA 142 144 140 141 149* 141  K 3.8 4.0 4.7 4.2 4.1 3.8  CL 111 111 106 108 116* 110  CO2 24 26 23   --  23 23  GLUCOSE 104* 90 107* 104* 97 95  BUN 20 19 21  25* 17 18  CREATININE 1.12* 1.15* 1.29* 1.40* 1.00 0.99  CALCIUM 9.8 9.8 9.9  --  9.6 9.7   Liver Function Tests:  Recent Labs Lab 10/13/12 1227  AST 34  ALT 28  ALKPHOS 120*  BILITOT 0.7  PROT 5.9*  ALBUMIN 3.4*    Recent Labs Lab 10/13/12 1227  LIPASE 39   No results found for this basename: AMMONIA,  in the last 168 hours CBC:  Recent Labs  Lab 10/13/12 1220 10/13/12 1244 10/13/12 1834 10/14/12 0551 10/14/12 1455 10/15/12 0425  WBC 8.2  --  5.8 4.7 4.3 4.9  NEUTROABS  --   --  3.7  --   --   --   HGB 13.5 14.3 12.3 11.4* 12.4 11.5*  HCT 40.7 42.0 37.3 35.8* 38.0 34.7*  MCV 85.0  --  83.8 84.0 84.4 83.8  PLT 215  --  169 178 199 184   Cardiac Enzymes:  Recent Labs Lab 10/11/12 0741 10/11/12 1148 10/13/12 1838 10/13/12 2225 10/14/12 0551  TROPONINI <0.30 <0.30 <0.30 <0.30 <0.30   BNP: BNP (last 3 results) No results found for this basename: PROBNP,  in the last 8760 hours CBG:  Recent Labs Lab  10/13/12 1219  GLUCAP 94       Signed:  ELLIS,ALLISON L. ANP  [facility] 10/15/2012, 12:41 PM   I have examined the patient, reviewed the chart and modified the above note which I agree with.   Hasini Peachey,MD [phone] 10/15/2012, 3:37 PM

## 2012-10-15 NOTE — Op Note (Signed)
LOOP RECORDER IMPLANT   Procedure report  Procedure performed:  1. Loop recorder implantation  Reason for procedure:  1. Recurrent syncope Procedure performed by:  Thurmon Fair, MD  Complications:  None  Estimated blood loss:  <5 mL   Device details:  Medtronic Reveal Linq model number [id number], serial number [id number] Procedure details:  After the risks and benefits of the procedure were discussed the patient provided informed consent. He was brought to the cardiac catheter lab in the fasting state. The patient was prepped and draped in usual sterile fashion. Local anesthesia with 1% lidocaine was administered to an area 2 cm to the left of the sternum in the 4th intercostal space. A horizontal incision was made using the incision tool. The introducer was then used to create a subcutaneous tunnel and carefully deploy the device. Local pressure was held to ensure hemostasis.  The incision was closed with SteriStrips and a sterile dressing was applied.   Thurmon Fair, MD, [facility] [phone] office [phone] pager 10/15/2012 9:12 AM

## 2012-10-16 ENCOUNTER — Telehealth: Payer: Self-pay | Admitting: Cardiovascular Disease

## 2012-10-16 MED ORDER — APIXABAN 2.5 MG PO TABS: 2.5000 mg | ORAL_TABLET | Freq: Two times a day (BID) | ORAL | Status: DC

## 2012-10-16 NOTE — Telephone Encounter (Signed)
Pt returning your call

## 2012-10-16 NOTE — Telephone Encounter (Signed)
Returning your call. °

## 2012-10-16 NOTE — Telephone Encounter (Signed)
Returned call and spoke w/ [name], pt's daughter.  Stated pt was taking Eliquis 2.5 mg BID before the hospital and now Rx says to take 5 mg BID.  Wants to know correct dose.  Dr. Salena Saner notified and advised pt should be taking 2.5 mg BID and apologized for confusion.  Daughter informed and of apology.  Verbalized understanding and agreed w/ plan.   New Rx sent to pharmacy for 2.5 mg tabs one BID per request by daughter.  Message to hold until call for refill.

## 2012-10-16 NOTE — Telephone Encounter (Signed)
Please call-pt was just discharged from the hospital yesterday-please call asap-question about her Eloiquist.

## 2012-10-16 NOTE — Telephone Encounter (Signed)
Returned call.  Left message to call back before 4pm.  

## 2012-10-20 ENCOUNTER — Encounter: Payer: Self-pay | Admitting: Cardiology

## 2012-10-20 ENCOUNTER — Ambulatory Visit (INDEPENDENT_AMBULATORY_CARE_PROVIDER_SITE_OTHER): Payer: Medicare Other | Admitting: Cardiology

## 2012-10-20 VITALS — BP 150/60 | HR 72 | Ht 63.0 in | Wt 155.2 lb

## 2012-10-20 DIAGNOSIS — R55 Syncope and collapse: Secondary | ICD-10-CM | POA: Diagnosis not present

## 2012-10-20 DIAGNOSIS — I480 Paroxysmal atrial fibrillation: Secondary | ICD-10-CM

## 2012-10-20 DIAGNOSIS — Z8679 Personal history of other diseases of the circulatory system: Secondary | ICD-10-CM

## 2012-10-20 DIAGNOSIS — Z7901 Long term (current) use of anticoagulants: Secondary | ICD-10-CM

## 2012-10-20 DIAGNOSIS — Z951 Presence of aortocoronary bypass graft: Secondary | ICD-10-CM

## 2012-10-20 DIAGNOSIS — I4891 Unspecified atrial fibrillation: Secondary | ICD-10-CM

## 2012-10-20 DIAGNOSIS — N183 Chronic kidney disease, stage 3 unspecified: Secondary | ICD-10-CM

## 2012-10-20 DIAGNOSIS — I10 Essential (primary) hypertension: Secondary | ICD-10-CM

## 2012-10-20 NOTE — Patient Instructions (Signed)
No standing water, showering is OK Your physician recommends that you schedule a follow-up appointment in: one month

## 2012-10-20 NOTE — Assessment & Plan Note (Signed)
She has had long 1sr degree AVB, transient junctional rhythm, and PAF

## 2012-10-20 NOTE — Assessment & Plan Note (Signed)
S/P loop recorder implant 10/14/12. No further syncope or pre syncope.

## 2012-10-20 NOTE — Assessment & Plan Note (Signed)
SCr 0.99 at discharge 10/15/12

## 2012-10-20 NOTE — Progress Notes (Signed)
10/20/2012 Stephanie Atkinson   01-11-1925  [id number]  Primary Stephanie Neer, MD Primary Cardiologist: Dr Allyson Sabal  HPI:  79 y/o with a history of CAD. She had CABG X 4 in 3/06. She had a DES placed to her PDA in July 2012, this was patent on follow up cath in Oct 2012 and recently 10/12/12. She did have distal disease that is to be treated medically. She h in the office 07/10/12 and was noted to have a long 1st degree AVB on her EKG, her PR was > 4. She was asymptomatic. She has a history of PAF since her CABG in 2006 and had been on Toprol, Amiodarone, ASA, and Plavix. At that time we stopped her Toprol and decreased her Amiodarone. When she was seen in follow up 07/29/12 her PR remained prolonged her Amiodarone was discontinued. On 10/06/12 she was seen and noted to be in AF with CVR. She was placed on Eloquis and her Plavix was  Stopped. She the was admitted a few days later with chest pain with subtle EKG changes. She was cathed 10/12/12 and her anatomy was unchanged. She was discharge but readmitted later the 19th after a syncopal spell. She had a loop recorder implanted 10/14/12. She was noted to be mildly dehydrated and this, as well as new nitrate therapy, may have contributed to her syncope. She is seen today for post loop follow up. She has had no angina or syncope.    Current Outpatient Prescriptions  Medication Sig Dispense Refill  . acetaminophen (TYLENOL) 325 MG tablet Take 650 mg by mouth every 4 (four) hours as needed. For pain      . alum & mag hydroxide-simeth (MAALOX/MYLANTA) 200-200-20 MG/5ML suspension Take 15 mLs by mouth every 2 (two) hours as needed.      [name] amLODipine (NORVASC) 5 MG tablet Take 5 mg by mouth daily.      [name] apixaban (ELIQUIS) 2.5 MG TABS tablet Take 1 tablet (2.5 mg total) by mouth 2 (two) times daily.  60 tablet  4  . aspirin EC 81 MG EC tablet Take 1 tablet (81 mg total) by mouth daily.      [name] atorvastatin (LIPITOR) 40 MG tablet Take 40 mg by mouth daily.       . clopidogrel (PLAVIX) 75 MG tablet       . furosemide (LASIX) 40 MG tablet Take 40 mg by mouth daily as needed. For fluid retention      . isosorbide mononitrate (IMDUR) 30 MG 24 hr tablet       . levothyroxine (SYNTHROID, LEVOTHROID) 50 MCG tablet Take 50 mcg by mouth daily.      . nitrofurantoin (MACRODANTIN) 100 MG capsule Take 1 capsule (100 mg total) by mouth every 12 (twelve) hours.  27 capsule  0  . nitroGLYCERIN (NITROSTAT) 0.4 MG SL tablet Place 0.4 mg under the tongue every 5 (five) minutes as needed. For chest pain      . pantoprazole (PROTONIX) 40 MG tablet Take 40 mg by mouth daily.      . traMADol (ULTRAM) 50 MG tablet Take 50 mg by mouth every 6 (six) hours as needed for pain.      . valsartan (DIOVAN) 320 MG tablet Take 320 mg by mouth daily.       No current facility-administered medications for this visit.    Allergies  Allergen Reactions  . Penicillins Hives  . Sulfa Antibiotics Swelling    History   Social History  .  Marital Status: Single    Spouse Name: N/A    Number of Children: N/A  . Years of Education: N/A   Occupational History  . Not on file.   Social History Main Topics  . Smoking status: Never Smoker   . Smokeless tobacco: Current User    Types: Snuff  . Alcohol Use: No  . Drug Use: No  . Sexual Activity: Not on file   Other Topics Concern  . Not on file   Social History Narrative  . No narrative on file     Review of Systems: General: negative for chills, fever, night sweats or weight changes.  Cardiovascular: negative for chest pain, dyspnea on exertion, edema, orthopnea, palpitations, paroxysmal nocturnal dyspnea or shortness of breath Dermatological: negative for rash Respiratory: negative for cough or wheezing Urologic: negative for hematuria Abdominal: negative for nausea, vomiting, diarrhea, bright red blood per rectum, melena, or hematemesis Neurologic: negative for visual changes, syncope, or dizziness All other systems  reviewed and are otherwise negative except as noted above.     Blood pressure 150/60, pulse 72, height 5\' 3"  (1.6 m), weight 155 lb 3.2 oz (70.398 kg).  Loop recorder site without signs of infection. Steri strips removed.  EKG NSR, long 1st degree AVB  ASSESSMENT AND PLAN:   Syncope and collapse S/P loop recorder implant 10/14/12. No further syncope or pre syncope.  History of first degree atrioventricular block She has had long 1sr degree AVB, transient junctional rhythm, and PAF  Chronic kidney disease, stage 3 SCr 0.99 at discharge 10/15/12  Chronic anticoagulation- Eloquis started and Plavix stopped 10/07/12 .  Hypertension controlled  Hx of CABG X 4 3/06-  DES PDA 7/12 with cath 10/13, 10/12/12 showing distal disease- medical Rx   PLAN  Follow up with Dr Allyson Sabal as scheduled. Consider resuming Imdur if she has chest pain in the future.  June Vacha KPA-C 10/20/2012 10:54 AM

## 2012-10-20 NOTE — Assessment & Plan Note (Signed)
DES PDA 7/12 with cath 10/13, 10/12/12 showing distal disease- medical Rx

## 2012-10-20 NOTE — Assessment & Plan Note (Signed)
controlled 

## 2012-10-23 ENCOUNTER — Telehealth: Payer: Self-pay | Admitting: Cardiovascular Disease

## 2012-10-23 NOTE — Telephone Encounter (Signed)
Left message -no restriction in activity or being around any equipment.

## 2012-10-23 NOTE — Telephone Encounter (Signed)
Patient has had a loop recorder implanted recently.  Does she need to stay away from the microwave oven?

## 2012-10-28 ENCOUNTER — Encounter: Payer: Self-pay | Admitting: Cardiovascular Disease

## 2012-10-28 ENCOUNTER — Ambulatory Visit (INDEPENDENT_AMBULATORY_CARE_PROVIDER_SITE_OTHER): Payer: Self-pay | Admitting: Cardiovascular Disease

## 2012-10-28 VITALS — BP 156/62 | HR 68 | Ht 64.0 in | Wt 156.6 lb

## 2012-10-28 DIAGNOSIS — Z951 Presence of aortocoronary bypass graft: Secondary | ICD-10-CM

## 2012-10-28 DIAGNOSIS — R55 Syncope and collapse: Secondary | ICD-10-CM

## 2012-10-28 DIAGNOSIS — I4891 Unspecified atrial fibrillation: Secondary | ICD-10-CM

## 2012-10-28 DIAGNOSIS — I480 Paroxysmal atrial fibrillation: Secondary | ICD-10-CM

## 2012-10-28 NOTE — Assessment & Plan Note (Signed)
Status post coronary artery bypass grafting in 2006 by Dr. Rexanne Mano with a LIMA to her LAD, a left radial to the first and second obtuse marginal branches and a free RIMA to the PDA. She underwent cardiac catheterization recently by Dr. Bryan Lemma because of chest pain 10/12/12 was unchanged anatomy. She has had no further symptoms.

## 2012-10-28 NOTE — Assessment & Plan Note (Signed)
Appears to be in sinus rhythm now on Eliquis anticoagulation

## 2012-10-28 NOTE — Progress Notes (Signed)
10/28/2012 Stephanie Atkinson   30-Nov-1924  [id number]  Primary Physician Stephanie Headings, Atkinson Primary Cardiologist: Stephanie Atkinson Stephanie Atkinson   HPI:  79 y/o with a history of CAD. She had CABG X 4 in 3/06. She had a DES placed to her PDA in July 2012, this was patent on follow up cath in Oct 2012 and recently 10/12/12. She did have distal disease that is to be treated medically. She h in the office 07/10/12 and was noted to have a long 1st degree AVB on her EKG, her PR was > 4. She was asymptomatic. She has a history of PAF since her CABG in 2006 and had been on Toprol, Amiodarone, ASA, and Plavix. At that time we stopped her Toprol and decreased her Amiodarone. When she was seen in follow up 07/29/12 her PR remained prolonged her Amiodarone was discontinued. On 10/06/12 she was seen and noted to be in AF with CVR. She was placed on Eloquis and her Plavix was Stopped. She the was admitted a few days later with chest pain with subtle EKG changes. She was cathed 10/12/12 and her anatomy was unchanged. She was discharge but readmitted later the 19th after a syncopal spell. She had a loop recorder implanted 10/14/12. She was noted to be mildly dehydrated and this, as well as new nitrate therapy, may have contributed to her syncope. . She has had no angina or syncope. She saw Stephanie Atkinson [facility] in the office one week ago for post loop implantation followup. The insertion site appears well-healed. She's had no recurrent episodes of syncope, presyncope, or chest pain.    Current Outpatient Prescriptions  Medication Sig Dispense Refill  . acetaminophen (TYLENOL) 325 MG tablet Take 650 mg by mouth every 4 (four) hours as needed. For pain      . alum & mag hydroxide-simeth (MAALOX/MYLANTA) 200-200-20 MG/5ML suspension Take 15 mLs by mouth every 2 (two) hours as needed.      [name] amLODipine (NORVASC) 5 MG tablet Take 5 mg by mouth daily.      [name] apixaban (ELIQUIS) 2.5 MG TABS tablet Take 1 tablet (2.5 mg  total) by mouth 2 (two) times daily.  60 tablet  4  . aspirin EC 81 MG EC tablet Take 1 tablet (81 mg total) by mouth daily.      [name] atorvastatin (LIPITOR) 40 MG tablet Take 40 mg by mouth daily.      . furosemide (LASIX) 40 MG tablet Take 40 mg by mouth daily as needed. For fluid retention      . levothyroxine (SYNTHROID, LEVOTHROID) 50 MCG tablet Take 50 mcg by mouth daily.      . nitrofurantoin (MACRODANTIN) 100 MG capsule Take 1 capsule (100 mg total) by mouth every 12 (twelve) hours.  27 capsule  0  . nitroGLYCERIN (NITROSTAT) 0.4 MG SL tablet Place 0.4 mg under the tongue every 5 (five) minutes as needed. For chest pain      . pantoprazole (PROTONIX) 40 MG tablet Take 40 mg by mouth daily.      . traMADol (ULTRAM) 50 MG tablet Take 50 mg by mouth every 6 (six) hours as needed for pain.      . valsartan (DIOVAN) 320 MG tablet Take 320 mg by mouth daily.       No current facility-administered medications for this visit.    Allergies  Allergen Reactions  . Penicillins Hives  . Sulfa Antibiotics Swelling    History   Social History  .  Marital Status: Single    Spouse Name: N/A    Number of Children: N/A  . Years of Education: N/A   Occupational History  . Not on file.   Social History Main Topics  . Smoking status: Never Smoker   . Smokeless tobacco: Current User    Types: Snuff  . Alcohol Use: No  . Drug Use: No  . Sexual Activity: Not on file   Other Topics Concern  . Not on file   Social History Narrative  . No narrative on file     Review of Systems: General: negative for chills, fever, night sweats or weight changes.  Cardiovascular: negative for chest pain, dyspnea on exertion, edema, orthopnea, palpitations, paroxysmal nocturnal dyspnea or shortness of breath Dermatological: negative for rash Respiratory: negative for cough or wheezing Urologic: negative for hematuria Abdominal: negative for nausea, vomiting, diarrhea, bright red blood per rectum, melena,  or hematemesis Neurologic: negative for visual changes, syncope, or dizziness All other systems reviewed and are otherwise negative except as noted above.    Blood pressure 156/62, pulse 68, height 5\' 4"  (1.626 m), weight 156 lb 9.6 oz (71.033 kg).  General appearance: alert and no distress Neck: no adenopathy, no carotid bruit, no JVD, supple, symmetrical, trachea midline and thyroid not enlarged, symmetric, no tenderness/mass/nodules Lungs: clear to auscultation bilaterally Heart: regular rate and rhythm, S1, S2 normal, no murmur, click, rub or gallop Abdomen: soft, non-tender; bowel sounds normal; no masses,  no organomegaly Pulses: 2+ and symmetric  EKG not performed today  ASSESSMENT AND PLAN:   Hx of CABG X 4 3/06-  Status post coronary artery bypass grafting in 2006 by Dr. Rexanne Mano with a LIMA to her LAD, a left radial to the first and second obtuse marginal branches and a free RIMA to the PDA. She underwent cardiac catheterization recently by Dr. Bryan Lemma because of chest pain 10/12/12 was unchanged anatomy. She has had no further symptoms.  Syncope and collapse She's had 2 syncopal episodes thought to be related to potentially dehydration and use of nitrates. A loop recorder was inserted by Dr. Royann Shivers   PAF - recurrent Appears to be in sinus rhythm now on Eliquis anticoagulation      Stephanie Atkinson [facility] 10/28/2012 11:06 AM

## 2012-10-28 NOTE — Assessment & Plan Note (Signed)
She's had 2 syncopal episodes thought to be related to potentially dehydration and use of nitrates. A loop recorder was inserted by Dr. Royann Shivers

## 2012-10-28 NOTE — Patient Instructions (Addendum)
Your physician wants you to follow-up in: 6 months with [facility] and Dr Allyson Sabal in 1 year.  You will receive a reminder letter in the mail two months in advance. If you don't receive a letter, please call our office to schedule the follow-up appointment.  You need to see Dr Royann Shivers in 3 months for a loop recorder check.

## 2012-11-05 DIAGNOSIS — M545 Low back pain: Secondary | ICD-10-CM | POA: Diagnosis not present

## 2012-11-05 DIAGNOSIS — I509 Heart failure, unspecified: Secondary | ICD-10-CM | POA: Diagnosis not present

## 2012-11-06 ENCOUNTER — Other Ambulatory Visit: Payer: Self-pay | Admitting: Cardiovascular Disease

## 2012-11-09 NOTE — Telephone Encounter (Signed)
Rx was sent to pharmacy electronically. 

## 2012-11-14 ENCOUNTER — Other Ambulatory Visit: Payer: Self-pay | Admitting: Cardiovascular Disease

## 2012-11-14 DIAGNOSIS — R55 Syncope and collapse: Secondary | ICD-10-CM | POA: Diagnosis not present

## 2012-11-14 DIAGNOSIS — I4891 Unspecified atrial fibrillation: Secondary | ICD-10-CM

## 2012-11-14 LAB — PACEMAKER DEVICE OBSERVATION

## 2012-11-24 DIAGNOSIS — M545 Low back pain: Secondary | ICD-10-CM | POA: Diagnosis not present

## 2012-11-24 DIAGNOSIS — M76899 Other specified enthesopathies of unspecified lower limb, excluding foot: Secondary | ICD-10-CM | POA: Diagnosis not present

## 2012-11-25 ENCOUNTER — Encounter: Payer: Self-pay | Admitting: *Deleted

## 2012-11-25 LAB — PACEMAKER DEVICE OBSERVATION

## 2012-11-25 LAB — REMOTE PACEMAKER DEVICE

## 2012-12-22 DIAGNOSIS — Z23 Encounter for immunization: Secondary | ICD-10-CM | POA: Diagnosis not present

## 2013-01-27 ENCOUNTER — Encounter: Payer: Self-pay | Admitting: Cardiovascular Disease

## 2013-01-27 ENCOUNTER — Ambulatory Visit (INDEPENDENT_AMBULATORY_CARE_PROVIDER_SITE_OTHER): Payer: Medicare Other | Admitting: Cardiovascular Disease

## 2013-01-27 VITALS — BP 130/60 | HR 68 | Ht 64.0 in | Wt 157.8 lb

## 2013-01-27 DIAGNOSIS — I480 Paroxysmal atrial fibrillation: Secondary | ICD-10-CM

## 2013-01-27 DIAGNOSIS — I4891 Unspecified atrial fibrillation: Secondary | ICD-10-CM

## 2013-01-27 DIAGNOSIS — R55 Syncope and collapse: Secondary | ICD-10-CM

## 2013-01-27 LAB — MDC_IDC_ENUM_SESS_TYPE_INCLINIC

## 2013-01-27 LAB — PACEMAKER DEVICE OBSERVATION

## 2013-01-27 NOTE — Patient Instructions (Addendum)
Your home monitor will be sending automatic transmissions at least every month. Please record symptoms as they occur.  Your physician recommends that you schedule a follow-up appointment in: 12 months   Continue to follow up with Dr.Berry as scheduled.

## 2013-02-08 DIAGNOSIS — M899 Disorder of bone, unspecified: Secondary | ICD-10-CM | POA: Diagnosis not present

## 2013-02-08 DIAGNOSIS — I10 Essential (primary) hypertension: Secondary | ICD-10-CM | POA: Diagnosis not present

## 2013-02-08 DIAGNOSIS — E039 Hypothyroidism, unspecified: Secondary | ICD-10-CM | POA: Diagnosis not present

## 2013-02-10 NOTE — Progress Notes (Signed)
Patient ID: Stephanie Atkinson, female   DOB: 03-Dec-1924, 79 y.o.   MRN: [id number]     Reason for office visit Loop recorder followup  Ms. Swanner had 2 episodes of syncope that might have been related to nitrates and hypovolemia but because of concern of possible arrhythmia she had a loop recorder implanted. She has a very long first degree AV block as well as a history of paroxysmal atrial fibrillation treated with amiodarone and metoprolol She's not had any new syncopal events since the device was placed. We have enrolled her in the remote monitoring system and she'll begin performing monthly transmissions. She does not have any discomfort at the loop recorder site which has healed well. She complains of some dizziness but does occurs when she lies flat in bed and turns his head from side to side. She has occasional shortness of breath if she "over exerts himself" but denies chest pain or edema. She has a long-standing history of structural heart disease and has previously undergone bypass surgery in 2006 as well subsequent percutaneous revascularization procedures. She underwent cardiac catheterization on August 18 without any new areas of coronary insufficiency and had syncope the very next day. She received a loop recorder on August 20. By most recent evaluation she has normal left ventricular systolic function Allergies  Allergen Reactions  . Penicillins Hives  . Sulfa Antibiotics Swelling    Current Outpatient Prescriptions  Medication Sig Dispense Refill  . acetaminophen (TYLENOL) 325 MG tablet Take 650 mg by mouth every 4 (four) hours as needed. For pain      . alum & mag hydroxide-simeth (MAALOX/MYLANTA) 200-200-20 MG/5ML suspension Take 15 mLs by mouth every 2 (two) hours as needed.      [name] amLODipine (NORVASC) 5 MG tablet Take 5 mg by mouth daily.      [name] apixaban (ELIQUIS) 2.5 MG TABS tablet Take 1 tablet (2.5 mg total) by mouth 2 (two) times daily.  60 tablet  4  . aspirin EC 81 MG EC  tablet Take 1 tablet (81 mg total) by mouth daily.      [name] atorvastatin (LIPITOR) 40 MG tablet Take 80 mg by mouth daily.       . furosemide (LASIX) 40 MG tablet Take 40 mg by mouth daily as needed. For fluid retention      . levothyroxine (SYNTHROID, LEVOTHROID) 50 MCG tablet Take 50 mcg by mouth daily.      . nitrofurantoin (MACRODANTIN) 100 MG capsule Take 100 mg by mouth every 12 (twelve) hours as needed.      . nitroGLYCERIN (NITROSTAT) 0.4 MG SL tablet Place 0.4 mg under the tongue every 5 (five) minutes as needed. For chest pain      . pantoprazole (PROTONIX) 40 MG tablet Take 40 mg by mouth daily.      . traMADol (ULTRAM) 50 MG tablet Take 50 mg by mouth every 6 (six) hours as needed for pain.      . valsartan (DIOVAN) 320 MG tablet TAKE 1 TABLET BY MOUTH EVERY DAY  30 tablet  11   No current facility-administered medications for this visit.    Past Medical History  Diagnosis Date  . Hypertension   . Hx of CABG 2006    X4  . MI, old     "mild"  . Hyperlipidemia   . Presence of stent in right coronary artery 07/12    RIMA-PDA occluded, DES stent placed  . PAF (paroxysmal atrial fibrillation)  on AMIO  . Syncope 07/12    bradycardic, beta blocker decreased  . Chronic kidney disease, stage 3   . Hypothyroid   . Coronary artery disease     Past Surgical History  Procedure Laterality Date  . Carotid stent      2012  . Cardiac catheterization  161096    dr. Onalee Hua harding, revealing a atretic bypass to her right with patent distal RCA stent  . Doppler echocardiography  045409    mild asymmetric left ventricular hypertrophy, left ventricular systolic function is low normal, ejection fraction = 50-55%, the LA is moderate dilated, the RA is mildly dilated, no significant valvular disease  . Nm myoview ltd  100510    post stress left ventricle is normal in size, post stress ejection fraction is 67% global left ventricular systolic function is normal, normal myocardial  perfusion study, abnormal myocardial perfusion study, low risk scan    Family History  Problem Relation Age of Onset  . CAD Mother   . CAD Maternal Grandmother     History   Social History  . Marital Status: Single    Spouse Name: N/A    Number of Children: N/A  . Years of Education: N/A   Occupational History  . Not on file.   Social History Main Topics  . Smoking status: Never Smoker   . Smokeless tobacco: Current User    Types: Snuff  . Alcohol Use: No  . Drug Use: No  . Sexual Activity: Not on file   Other Topics Concern  . Not on file   Social History Narrative  . No narrative on file    Review of systems: The patient specifically denies any chest pain at rest or with exertion, dyspnea at rest or with exertion, orthopnea, paroxysmal nocturnal dyspnea, syncope, palpitations, focal neurological deficits, intermittent claudication, lower extremity edema, unexplained weight gain, cough, hemoptysis or wheezing.  The patient also denies abdominal pain, nausea, vomiting, dysphagia, diarrhea, constipation, polyuria, polydipsia, dysuria, hematuria, frequency, urgency, abnormal bleeding or bruising, fever, chills, unexpected weight changes, mood swings, change in skin or hair texture, change in voice quality, auditory or visual problems, allergic reactions or rashes, new musculoskeletal complaints other than usual "aches and pains".   PHYSICAL EXAM BP 130/60  Pulse 68  Ht 5\' 4"  (1.626 m)  Wt 157 lb 12.8 oz (71.578 kg)  BMI 27.07 kg/m2  General: Alert, oriented x3, no distress Head: no evidence of trauma, PERRL, EOMI, no exophtalmos or lid lag, no myxedema, no xanthelasma; normal ears, nose and oropharynx Neck: normal jugular venous pulsations and no hepatojugular reflux; brisk carotid pulses without delay and no carotid bruits Chest: clear to auscultation, no signs of consolidation by percussion or palpation, normal fremitus, symmetrical and full respiratory excursions;  healthy sternotomy and loop recorder site Cardiovascular: normal position and quality of the apical impulse, regular rhythm, normal first and second heart sounds, no murmurs, rubs or gallops Abdomen: no tenderness or distention, no masses by palpation, no abnormal pulsatility or arterial bruits, normal bowel sounds, no hepatosplenomegaly Extremities: no clubbing, cyanosis or edema; 2+ radial, ulnar and brachial pulses bilaterally; 2+ right femoral, posterior tibial and dorsalis pedis pulses; 2+ left femoral, posterior tibial and dorsalis pedis pulses; no subclavian or femoral bruits Neurological: grossly nonfocal   EKG: Sinus rhythm, first degree AV block  Lipid Panel     Component Value Date/Time   CHOL 147 12/11/2010 0901   TRIG 84 12/11/2010 0901   HDL 56 12/11/2010 0901  CHOLHDL 2.6 12/11/2010 0901   VLDL 17 12/11/2010 0901   LDLCALC 74 12/11/2010 0901    BMET    Component Value Date/Time   NA 141 10/15/2012 0425   K 3.8 10/15/2012 0425   CL 110 10/15/2012 0425   CO2 23 10/15/2012 0425   GLUCOSE 95 10/15/2012 0425   BUN 18 10/15/2012 0425   CREATININE 0.99 10/15/2012 0425   CALCIUM 9.7 10/15/2012 0425   GFRNONAA 50* 10/15/2012 0425   GFRAA 58* 10/15/2012 0425     ASSESSMENT AND PLAN Syncope of uncertain etiology. Possibilities include bradycardia arrhythmias related to underlying conduction system disease and negative chronotropic medication, tachycardia (atrial fibrillation with RVR) or hypotension. No evidence of occurred since the loop recorder implantation. Patient Instructions  Your home monitor will be sending automatic transmissions at least every month. Please record symptoms as they occur.  Your physician recommends that you schedule a follow-up appointment in: 12 months   Continue to follow up with Dr.Berry as scheduled.        Orders Placed This Encounter  Procedures  . Implantable device check   Meds ordered this encounter  Medications  . nitrofurantoin  (MACRODANTIN) 100 MG capsule    Sig: Take 100 mg by mouth every 12 (twelve) hours as needed.    Junious Silk, MD, [facility] [phone] office [phone] pager

## 2013-02-15 DIAGNOSIS — E785 Hyperlipidemia, unspecified: Secondary | ICD-10-CM | POA: Diagnosis not present

## 2013-02-15 DIAGNOSIS — I2510 Atherosclerotic heart disease of native coronary artery without angina pectoris: Secondary | ICD-10-CM | POA: Diagnosis not present

## 2013-02-15 DIAGNOSIS — E039 Hypothyroidism, unspecified: Secondary | ICD-10-CM | POA: Diagnosis not present

## 2013-02-15 DIAGNOSIS — I10 Essential (primary) hypertension: Secondary | ICD-10-CM | POA: Diagnosis not present

## 2013-02-19 ENCOUNTER — Ambulatory Visit (INDEPENDENT_AMBULATORY_CARE_PROVIDER_SITE_OTHER): Payer: Medicare Other | Admitting: *Deleted

## 2013-02-19 DIAGNOSIS — R55 Syncope and collapse: Secondary | ICD-10-CM

## 2013-02-19 LAB — PACEMAKER DEVICE OBSERVATION

## 2013-03-16 DIAGNOSIS — R300 Dysuria: Secondary | ICD-10-CM | POA: Diagnosis not present

## 2013-03-16 DIAGNOSIS — N390 Urinary tract infection, site not specified: Secondary | ICD-10-CM | POA: Diagnosis not present

## 2013-03-23 ENCOUNTER — Ambulatory Visit
Admission: RE | Admit: 2013-03-23 | Discharge: 2013-03-23 | Disposition: A | Discharge status: Home / Self Care | Payer: Medicare Other | Source: Ambulatory Visit | Attending: Internal Medicine | Admitting: Internal Medicine

## 2013-03-23 ENCOUNTER — Other Ambulatory Visit: Payer: Self-pay | Admitting: Internal Medicine

## 2013-03-23 DIAGNOSIS — R21 Rash and other nonspecific skin eruption: Secondary | ICD-10-CM

## 2013-03-23 DIAGNOSIS — M79609 Pain in unspecified limb: Secondary | ICD-10-CM | POA: Diagnosis not present

## 2013-03-25 ENCOUNTER — Ambulatory Visit (INDEPENDENT_AMBULATORY_CARE_PROVIDER_SITE_OTHER): Payer: Medicare Other | Admitting: *Deleted

## 2013-03-25 DIAGNOSIS — I4430 Unspecified atrioventricular block: Secondary | ICD-10-CM | POA: Diagnosis not present

## 2013-03-25 LAB — PACEMAKER DEVICE OBSERVATION

## 2013-03-30 DIAGNOSIS — M67919 Unspecified disorder of synovium and tendon, unspecified shoulder: Secondary | ICD-10-CM | POA: Diagnosis not present

## 2013-03-30 DIAGNOSIS — M719 Bursopathy, unspecified: Secondary | ICD-10-CM | POA: Diagnosis not present

## 2013-04-22 LAB — MDC_IDC_ENUM_SESS_TYPE_REMOTE

## 2013-04-26 ENCOUNTER — Ambulatory Visit: Payer: Medicare Other | Admitting: Cardiology

## 2013-04-26 ENCOUNTER — Ambulatory Visit (INDEPENDENT_AMBULATORY_CARE_PROVIDER_SITE_OTHER): Payer: Medicare Other | Admitting: *Deleted

## 2013-04-26 DIAGNOSIS — I4891 Unspecified atrial fibrillation: Secondary | ICD-10-CM | POA: Diagnosis not present

## 2013-04-26 DIAGNOSIS — I480 Paroxysmal atrial fibrillation: Secondary | ICD-10-CM

## 2013-04-26 LAB — PACEMAKER DEVICE OBSERVATION

## 2013-05-04 ENCOUNTER — Ambulatory Visit (HOSPITAL_COMMUNITY): Payer: Medicare Other

## 2013-05-04 ENCOUNTER — Encounter: Payer: Self-pay | Admitting: Cardiology

## 2013-05-04 ENCOUNTER — Ambulatory Visit (INDEPENDENT_AMBULATORY_CARE_PROVIDER_SITE_OTHER): Payer: Medicare Other | Admitting: Cardiology

## 2013-05-04 VITALS — BP 140/64 | HR 71 | Ht 64.0 in | Wt 162.0 lb

## 2013-05-04 DIAGNOSIS — Z951 Presence of aortocoronary bypass graft: Secondary | ICD-10-CM

## 2013-05-04 DIAGNOSIS — Z9861 Coronary angioplasty status: Secondary | ICD-10-CM | POA: Diagnosis not present

## 2013-05-04 DIAGNOSIS — I4430 Unspecified atrioventricular block: Secondary | ICD-10-CM | POA: Diagnosis not present

## 2013-05-04 DIAGNOSIS — Z7901 Long term (current) use of anticoagulants: Secondary | ICD-10-CM

## 2013-05-04 DIAGNOSIS — I4891 Unspecified atrial fibrillation: Secondary | ICD-10-CM

## 2013-05-04 DIAGNOSIS — Z955 Presence of coronary angioplasty implant and graft: Secondary | ICD-10-CM

## 2013-05-04 DIAGNOSIS — I480 Paroxysmal atrial fibrillation: Secondary | ICD-10-CM

## 2013-05-04 DIAGNOSIS — I498 Other specified cardiac arrhythmias: Secondary | ICD-10-CM | POA: Insufficient documentation

## 2013-05-04 NOTE — Assessment & Plan Note (Signed)
No angina 

## 2013-05-04 NOTE — Progress Notes (Signed)
05/04/2013 Stephanie Atkinson   1924-04-17  [id number]  Primary Stephanie Neer, MD Primary Cardiologist: Dr Allyson Sabal Dr Croitoru  HPI:  80 y/o with a history of CAD. She had CABG X 4 in 3/06. She had a DES placed to her PDA in July 2012, this was patent on follow up cath in Oct 2012 and recently 10/12/12. She did have distal disease that is to be treated medically. She had been seen in the office 07/10/12 and was noted to have a long 1st degree AVB on her EKG, her PR was > 4. She was asymptomatic. She has a history of PAF since her CABG in 2006 and had been on Toprol, Amiodarone, ASA, and Plavix. At that time we stopped her Toprol and decreased her Amiodarone. When she was seen in follow up 07/29/12 her PR remained prolonged her Amiodarone was discontinued. On 10/06/12 she was seen and noted to be in AF with CVR. She was placed on Eloquis and her Plavix was Stopped. She then was admitted a few days later with chest pain with subtle EKG changes. She was cathed 10/12/12 and her anatomy was unchanged. She was discharge but readmitted later the 19th after a syncopal spell. She had a loop recorder implanted 10/14/12. She was noted to be mildly dehydrated and this, as well as new nitrate therapy, may have contributed to her syncope. Dr Royann Shivers saw he in follow up 02/10/13 and she was doing well. She has had no evidence of AF or bradycardia reported on her loop and she denies any dyspnea, near syncope, chest pain, or palpitations. Today her EKG shows an accelerated junctional rhythm with narrow QRS complex and a rate of 70. She is asymptomatic.     Current Outpatient Prescriptions  Medication Sig Dispense Refill  . acetaminophen (TYLENOL) 325 MG tablet Take 650 mg by mouth every 4 (four) hours as needed. For pain      . alum & mag hydroxide-simeth (MAALOX/MYLANTA) 200-200-20 MG/5ML suspension Take 15 mLs by mouth every 2 (two) hours as needed.      [name] amLODipine (NORVASC) 5 MG tablet Take 5 mg by mouth  daily.      [name] apixaban (ELIQUIS) 2.5 MG TABS tablet Take 1 tablet (2.5 mg total) by mouth 2 (two) times daily.  60 tablet  4  . aspirin EC 81 MG EC tablet Take 1 tablet (81 mg total) by mouth daily.      [name] atorvastatin (LIPITOR) 40 MG tablet Take 80 mg by mouth daily.       . furosemide (LASIX) 40 MG tablet Take 40 mg by mouth daily as needed. For fluid retention      . levothyroxine (SYNTHROID, LEVOTHROID) 50 MCG tablet Take 50 mcg by mouth daily.      . nitrofurantoin (MACRODANTIN) 100 MG capsule Take 100 mg by mouth every 12 (twelve) hours as needed.      . nitroGLYCERIN (NITROSTAT) 0.4 MG SL tablet Place 0.4 mg under the tongue every 5 (five) minutes as needed. For chest pain      . pantoprazole (PROTONIX) 40 MG tablet Take 40 mg by mouth daily.      . traMADol (ULTRAM) 50 MG tablet Take 50 mg by mouth every 6 (six) hours as needed for pain.      . valsartan (DIOVAN) 320 MG tablet TAKE 1 TABLET BY MOUTH EVERY DAY  30 tablet  11   No current facility-administered medications for this visit.    Allergies  Allergen Reactions  . Penicillins Hives  . Sulfa Antibiotics Swelling    History   Social History  . Marital Status: Single    Spouse Name: N/A    Number of Children: N/A  . Years of Education: N/A   Occupational History  . Not on file.   Social History Main Topics  . Smoking status: Never Smoker   . Smokeless tobacco: Current User    Types: Snuff  . Alcohol Use: No  . Drug Use: No  . Sexual Activity: Not on file   Other Topics Concern  . Not on file   Social History Narrative  . No narrative on file     Review of Systems: General: negative for chills, fever, night sweats or weight changes.  Cardiovascular: negative for chest pain, dyspnea on exertion, edema, orthopnea, palpitations, paroxysmal nocturnal dyspnea or shortness of breath Dermatological: negative for rash Respiratory: negative for cough or wheezing Urologic: negative for hematuria Abdominal:  negative for nausea, vomiting, diarrhea, bright red blood per rectum, melena, or hematemesis Neurologic: negative for visual changes, syncope, or dizziness All other systems reviewed and are otherwise negative except as noted above.    Blood pressure 140/64, pulse 71, height 5\' 4"  (1.626 m), weight 162 lb (73.483 kg).  General appearance: alert, cooperative and no distress Lungs: clear to auscultation bilaterally Heart: regular rate and rhythm  EKG Accelerated junctional rhythm at 70  ASSESSMENT AND PLAN:   AVB - beta blocker and Amiodarone stopped She is now in an accelerated junctional rhythm- asymptomatic  PAF history .  RCA DES placed 7/12- patent 8/14 No angina  Hx of CABG X 4 3/06-  .  Chronic anticoagulation- Eloquis started and Plavix stopped 10/07/12 .  Accelerated junctional rhythm Asymptomatic    PLAN  I reviewed her EKG with Dr Royann Shiversroitoru. She is asymptomatic. We'll continue to monitor. Follow up with Dr Royann Shiversroitoru in 3 months. If she develops any symptoms she will need a pacemaker.   [facility] CenterKILROY,Damari Hiltz KPA-C 05/04/2013 12:51 PM

## 2013-05-04 NOTE — Patient Instructions (Signed)
Your physician recommends that you schedule a follow-up appointment in: 3 months with Dr. Croitoru.  

## 2013-05-04 NOTE — Assessment & Plan Note (Signed)
She is now in an accelerated junctional rhythm- asymptomatic

## 2013-05-04 NOTE — Assessment & Plan Note (Signed)
Asymptomatic. 

## 2013-05-20 DIAGNOSIS — R300 Dysuria: Secondary | ICD-10-CM | POA: Diagnosis not present

## 2013-05-26 ENCOUNTER — Ambulatory Visit (INDEPENDENT_AMBULATORY_CARE_PROVIDER_SITE_OTHER): Payer: Medicare Other | Admitting: *Deleted

## 2013-05-26 DIAGNOSIS — I4891 Unspecified atrial fibrillation: Secondary | ICD-10-CM | POA: Diagnosis not present

## 2013-05-26 DIAGNOSIS — I480 Paroxysmal atrial fibrillation: Secondary | ICD-10-CM

## 2013-05-26 LAB — MDC_IDC_ENUM_SESS_TYPE_REMOTE

## 2013-06-13 LAB — MDC_IDC_ENUM_SESS_TYPE_REMOTE

## 2013-06-14 ENCOUNTER — Encounter: Payer: Self-pay | Admitting: Cardiovascular Disease

## 2013-06-23 ENCOUNTER — Ambulatory Visit (INDEPENDENT_AMBULATORY_CARE_PROVIDER_SITE_OTHER): Payer: Medicare Other | Admitting: *Deleted

## 2013-06-23 DIAGNOSIS — R55 Syncope and collapse: Secondary | ICD-10-CM

## 2013-07-27 ENCOUNTER — Ambulatory Visit (INDEPENDENT_AMBULATORY_CARE_PROVIDER_SITE_OTHER): Payer: Medicare Other

## 2013-07-27 DIAGNOSIS — R55 Syncope and collapse: Secondary | ICD-10-CM

## 2013-08-03 ENCOUNTER — Encounter: Payer: Self-pay | Admitting: Cardiovascular Disease

## 2013-08-03 ENCOUNTER — Ambulatory Visit (INDEPENDENT_AMBULATORY_CARE_PROVIDER_SITE_OTHER): Payer: Medicare Other | Admitting: Cardiovascular Disease

## 2013-08-03 VITALS — BP 136/72 | HR 68 | Resp 16 | Ht 63.0 in | Wt 160.5 lb

## 2013-08-03 DIAGNOSIS — I4891 Unspecified atrial fibrillation: Secondary | ICD-10-CM | POA: Diagnosis not present

## 2013-08-03 DIAGNOSIS — I4430 Unspecified atrioventricular block: Secondary | ICD-10-CM

## 2013-08-03 DIAGNOSIS — E039 Hypothyroidism, unspecified: Secondary | ICD-10-CM | POA: Diagnosis not present

## 2013-08-03 DIAGNOSIS — I480 Paroxysmal atrial fibrillation: Secondary | ICD-10-CM

## 2013-08-03 DIAGNOSIS — R55 Syncope and collapse: Secondary | ICD-10-CM | POA: Diagnosis not present

## 2013-08-03 DIAGNOSIS — R5383 Other fatigue: Secondary | ICD-10-CM | POA: Diagnosis not present

## 2013-08-03 DIAGNOSIS — R5381 Other malaise: Secondary | ICD-10-CM | POA: Diagnosis not present

## 2013-08-03 DIAGNOSIS — I498 Other specified cardiac arrhythmias: Secondary | ICD-10-CM | POA: Diagnosis not present

## 2013-08-03 LAB — PACEMAKER DEVICE OBSERVATION

## 2013-08-03 LAB — MDC_IDC_ENUM_SESS_TYPE_INCLINIC
Date Time Interrogation Session: 20150609153503
MDC IDC SET ZONE DETECTION INTERVAL: 420 ms
Zone Setting Detection Interval: 2000 ms
Zone Setting Detection Interval: 3000 ms

## 2013-08-03 NOTE — Patient Instructions (Addendum)
Your physician has ordered the following test(s) to be done:  Lab work - TSH, T4  Dr [employer] wants you to follow-up in 6 months. You will receive a reminder letter in the mail one months in advance. If you don't receive a letter, please call our office to schedule the follow-up appointment.

## 2013-08-03 NOTE — Progress Notes (Signed)
Patient ID: Stephanie Atkinson, female   DOB: 14-Jul-1924, 80 y.o.   MRN: [id number]      Reason for office visit Syncope, implantable loop recorder check  Stephanie Atkinson feels well and is here for routine followup.  She has an extensive history of coronary disease and underwent bypass surgery in 2006 and subsequent percutaneous revascularization procedures. She underwent cardiac catheterization on October 12, 2012 without any new areas of coronary insufficiency and had syncope the very next day. She received a loop recorder on August 20. She has normal left ventricular systolic function. She has a history of paroxysmal atrial fibrillation and AV conduction disease parents is very long first degree AV block). Her syncope occurred when receiving treatment with amiodarone and metoprolol. Since implantation of the loop recorder she has not had syncope.   The device has not recorded any meaningful episodes of bradyarrhythmia or any ventricular tachyarrhythmia. She continues to have a relatively low burden of atrial fibrillation at 7.2% with good ventricular rate control. However over the last couple of months there seems to be increasing prevalence of atrial fibrillation compared with earlier this year. She is oblivious to the arrhythmia and is unaware of palpitations.    Allergies  Allergen Reactions  . Penicillins Hives  . Sulfa Antibiotics Swelling    Current Outpatient Prescriptions  Medication Sig Dispense Refill  . acetaminophen (TYLENOL) 325 MG tablet Take 650 mg by mouth every 4 (four) hours as needed. For pain      . alum & mag hydroxide-simeth (MAALOX/MYLANTA) 200-200-20 MG/5ML suspension Take 15 mLs by mouth every 2 (two) hours as needed.      [name] amLODipine (NORVASC) 5 MG tablet Take 5 mg by mouth daily.      [name] apixaban (ELIQUIS) 2.5 MG TABS tablet Take 1 tablet (2.5 mg total) by mouth 2 (two) times daily.  60 tablet  4  . aspirin EC 81 MG EC tablet Take 1 tablet (81 mg total) by mouth daily.       [name] atorvastatin (LIPITOR) 40 MG tablet Take 80 mg by mouth daily.       . furosemide (LASIX) 40 MG tablet Take 40 mg by mouth daily as needed. For fluid retention      . levothyroxine (SYNTHROID, LEVOTHROID) 50 MCG tablet Take 50 mcg by mouth daily.      . nitrofurantoin (MACRODANTIN) 100 MG capsule Take 100 mg by mouth every 12 (twelve) hours as needed.      . nitroGLYCERIN (NITROSTAT) 0.4 MG SL tablet Place 0.4 mg under the tongue every 5 (five) minutes as needed. For chest pain      . traMADol (ULTRAM) 50 MG tablet Take 50 mg by mouth every 6 (six) hours as needed for pain.      . valsartan (DIOVAN) 320 MG tablet TAKE 1 TABLET BY MOUTH EVERY DAY  30 tablet  11   No current facility-administered medications for this visit.    Past Medical History  Diagnosis Date  . Hypertension   . Hx of CABG 2006    X4  . MI, old     "mild"  . Hyperlipidemia   . Presence of stent in right coronary artery 07/12    RIMA-PDA occluded, DES stent placed  . PAF (paroxysmal atrial fibrillation)     on AMIO  . Syncope 07/12    bradycardic, beta blocker decreased  . Chronic kidney disease, stage 3   . Hypothyroid   . Coronary artery disease  Past Surgical History  Procedure Laterality Date  . Carotid stent      2012  . Cardiac catheterization  [id number]    dr. Onalee Huadavid harding, revealing a atretic bypass to her right with patent distal RCA stent  . Doppler echocardiography  [id number]    mild asymmetric left ventricular hypertrophy, left ventricular systolic function is low normal, ejection fraction = 50-55%, the LA is moderate dilated, the RA is mildly dilated, no significant valvular disease  . Nm myoview ltd  100510    post stress left ventricle is normal in size, post stress ejection fraction is 67% global left ventricular systolic function is normal, normal myocardial perfusion study, abnormal myocardial perfusion study, low risk scan    Family History  Problem Relation Age of Onset  . CAD  Mother   . CAD Maternal Grandmother     History   Social History  . Marital Status: Single    Spouse Name: N/A    Number of Children: N/A  . Years of Education: N/A   Occupational History  . Not on file.   Social History Main Topics  . Smoking status: Never Smoker   . Smokeless tobacco: Current User    Types: Snuff  . Alcohol Use: No  . Drug Use: No  . Sexual Activity: Not on file   Other Topics Concern  . Not on file   Social History Narrative  . No narrative on file    Review of systems: The patient specifically denies any chest pain at rest or with exertion, dyspnea at rest or with exertion, orthopnea, paroxysmal nocturnal dyspnea, syncope, palpitations, focal neurological deficits, intermittent claudication, lower extremity edema, unexplained weight gain, cough, hemoptysis or wheezing.  The patient also denies abdominal pain, nausea, vomiting, dysphagia, diarrhea, constipation, polyuria, polydipsia, dysuria, hematuria, frequency, urgency, abnormal bleeding or bruising, fever, chills, unexpected weight changes, mood swings, change in skin or hair texture, change in voice quality, auditory or visual problems, allergic reactions or rashes, new musculoskeletal complaints other than usual "aches and pains".   PHYSICAL EXAM BP 136/72  Pulse 68  Resp 16  Ht 5\' 3"  (1.6 m)  Wt 160 lb 8 oz (72.802 kg)  BMI 28.44 kg/m2 General: Alert, oriented x3, no distress  Head: no evidence of trauma, PERRL, EOMI, no exophtalmos or lid lag, no myxedema, no xanthelasma; normal ears, nose and oropharynx  Neck: normal jugular venous pulsations and no hepatojugular reflux; brisk carotid pulses without delay and no carotid bruits  Chest: clear to auscultation, no signs of consolidation by percussion or palpation, normal fremitus, symmetrical and full respiratory excursions; healthy sternotomy and loop recorder site  Cardiovascular: normal position and quality of the apical impulse, regular  rhythm, normal first and second heart sounds, no murmurs, rubs or gallops  Abdomen: no tenderness or distention, no masses by palpation, no abnormal pulsatility or arterial bruits, normal bowel sounds, no hepatosplenomegaly  Extremities: no clubbing, cyanosis or edema; 2+ radial, ulnar and brachial pulses bilaterally; 2+ right femoral, posterior tibial and dorsalis pedis pulses; 2+ left femoral, posterior tibial and dorsalis pedis pulses; no subclavian or femoral bruits  Neurological: grossly nonfocal   EKG: Sinus rhythm, first degree AV block   Lipid Panel     Component Value Date/Time   CHOL 147 12/11/2010 0901   TRIG 84 12/11/2010 0901   HDL 56 12/11/2010 0901   CHOLHDL 2.6 12/11/2010 0901   VLDL 17 12/11/2010 0901   LDLCALC 74 12/11/2010 0901    BMET  Component Value Date/Time   NA 141 10/15/2012 0425   K 3.8 10/15/2012 0425   CL 110 10/15/2012 0425   CO2 23 10/15/2012 0425   GLUCOSE 95 10/15/2012 0425   BUN 18 10/15/2012 0425   CREATININE 0.99 10/15/2012 0425   CALCIUM 9.7 10/15/2012 0425   GFRNONAA 50* 10/15/2012 0425   GFRAA 58* 10/15/2012 0425     ASSESSMENT AND PLAN  Stephanie Atkinson has not had any near syncopal events and her loop recorder has not recorded any rhythm abnormalities that could explain previous syncope. It appears likely that she may have had a bradycardic event related to treatment with amiodarone and metoprolol for paroxysmal atrial fibrillation.   He seems to be slight increase in the overall prevalence of atrial fibrillation. There are no clinical findings to support coronary insufficiency or heart failure as a cause for this. I have advised that she have her thyroid function tests rechecked. She is appropriately anticoagulated. Antiarrhythmic medicines do not appear to be indicated since she is asymptomatic during atrial fibrillation. Rate control remains excellent  Orders Placed This Encounter  Procedures  . T4, free  . TSH  . Implantable device check    No orders of the defined types were placed in this encounter.    Awesome Jared  Thurmon Fair, MD, [facility] [phone] office [phone] pager

## 2013-08-05 LAB — TSH: TSH: 2.342 u[IU]/mL (ref 0.350–4.500)

## 2013-08-05 LAB — T4, FREE: Free T4: 1.2 ng/dL (ref 0.80–1.80)

## 2013-08-06 DIAGNOSIS — M67919 Unspecified disorder of synovium and tendon, unspecified shoulder: Secondary | ICD-10-CM | POA: Diagnosis not present

## 2013-08-06 DIAGNOSIS — M25559 Pain in unspecified hip: Secondary | ICD-10-CM | POA: Diagnosis not present

## 2013-08-06 DIAGNOSIS — M25569 Pain in unspecified knee: Secondary | ICD-10-CM | POA: Diagnosis not present

## 2013-08-16 DIAGNOSIS — Z0000 Encounter for general adult medical examination without abnormal findings: Secondary | ICD-10-CM | POA: Diagnosis not present

## 2013-08-16 DIAGNOSIS — M899 Disorder of bone, unspecified: Secondary | ICD-10-CM | POA: Diagnosis not present

## 2013-08-16 DIAGNOSIS — E039 Hypothyroidism, unspecified: Secondary | ICD-10-CM | POA: Diagnosis not present

## 2013-08-16 DIAGNOSIS — E663 Overweight: Secondary | ICD-10-CM | POA: Diagnosis not present

## 2013-08-16 DIAGNOSIS — Z1331 Encounter for screening for depression: Secondary | ICD-10-CM | POA: Diagnosis not present

## 2013-08-16 DIAGNOSIS — I10 Essential (primary) hypertension: Secondary | ICD-10-CM | POA: Diagnosis not present

## 2013-08-16 DIAGNOSIS — N390 Urinary tract infection, site not specified: Secondary | ICD-10-CM | POA: Diagnosis not present

## 2013-08-20 ENCOUNTER — Ambulatory Visit (INDEPENDENT_AMBULATORY_CARE_PROVIDER_SITE_OTHER): Payer: Medicare Other | Admitting: *Deleted

## 2013-08-20 DIAGNOSIS — R55 Syncope and collapse: Secondary | ICD-10-CM

## 2013-08-21 LAB — MDC_IDC_ENUM_SESS_TYPE_REMOTE: Date Time Interrogation Session: 20150627040500

## 2013-08-23 DIAGNOSIS — I2510 Atherosclerotic heart disease of native coronary artery without angina pectoris: Secondary | ICD-10-CM | POA: Diagnosis not present

## 2013-08-23 DIAGNOSIS — E785 Hyperlipidemia, unspecified: Secondary | ICD-10-CM | POA: Diagnosis not present

## 2013-08-23 DIAGNOSIS — M810 Age-related osteoporosis without current pathological fracture: Secondary | ICD-10-CM | POA: Diagnosis not present

## 2013-08-23 DIAGNOSIS — E039 Hypothyroidism, unspecified: Secondary | ICD-10-CM | POA: Diagnosis not present

## 2013-08-23 DIAGNOSIS — I10 Essential (primary) hypertension: Secondary | ICD-10-CM | POA: Diagnosis not present

## 2013-08-31 NOTE — Progress Notes (Signed)
Loop recorder 

## 2013-09-01 ENCOUNTER — Other Ambulatory Visit: Payer: Self-pay

## 2013-09-01 MED ORDER — APIXABAN 2.5 MG PO TABS: 2.5000 mg | ORAL_TABLET | Freq: Two times a day (BID) | ORAL | Status: DC

## 2013-09-01 NOTE — Telephone Encounter (Signed)
Rx was sent to pharmacy electronically. 

## 2013-09-09 DIAGNOSIS — M79609 Pain in unspecified limb: Secondary | ICD-10-CM | POA: Diagnosis not present

## 2013-09-09 DIAGNOSIS — R079 Chest pain, unspecified: Secondary | ICD-10-CM | POA: Diagnosis not present

## 2013-09-14 ENCOUNTER — Encounter: Payer: Self-pay | Admitting: Cardiology

## 2013-09-16 ENCOUNTER — Encounter: Payer: Self-pay | Admitting: Cardiovascular Disease

## 2013-09-20 ENCOUNTER — Encounter: Payer: Self-pay | Admitting: Cardiovascular Disease

## 2013-09-21 ENCOUNTER — Encounter: Payer: Self-pay | Admitting: Cardiovascular Disease

## 2013-09-22 ENCOUNTER — Ambulatory Visit (INDEPENDENT_AMBULATORY_CARE_PROVIDER_SITE_OTHER): Payer: Medicare Other | Admitting: *Deleted

## 2013-09-22 DIAGNOSIS — R55 Syncope and collapse: Secondary | ICD-10-CM | POA: Diagnosis not present

## 2013-09-22 LAB — MDC_IDC_ENUM_SESS_TYPE_REMOTE

## 2013-09-29 NOTE — Progress Notes (Signed)
Loop recorder 

## 2013-10-05 DIAGNOSIS — M1610 Unilateral primary osteoarthritis, unspecified hip: Secondary | ICD-10-CM | POA: Diagnosis not present

## 2013-10-05 DIAGNOSIS — M25559 Pain in unspecified hip: Secondary | ICD-10-CM | POA: Diagnosis not present

## 2013-10-05 DIAGNOSIS — M169 Osteoarthritis of hip, unspecified: Secondary | ICD-10-CM | POA: Diagnosis not present

## 2013-10-11 ENCOUNTER — Encounter: Payer: Self-pay | Admitting: Cardiovascular Disease

## 2013-10-22 ENCOUNTER — Ambulatory Visit (INDEPENDENT_AMBULATORY_CARE_PROVIDER_SITE_OTHER): Payer: Medicare Other | Admitting: *Deleted

## 2013-10-22 DIAGNOSIS — R55 Syncope and collapse: Secondary | ICD-10-CM

## 2013-10-28 NOTE — Progress Notes (Signed)
Loop recorder 

## 2013-11-01 LAB — MDC_IDC_ENUM_SESS_TYPE_REMOTE

## 2013-11-02 ENCOUNTER — Ambulatory Visit: Payer: Medicare Other | Admitting: Cardiovascular Disease

## 2013-11-02 LAB — MDC_IDC_ENUM_SESS_TYPE_REMOTE

## 2013-11-04 ENCOUNTER — Other Ambulatory Visit: Payer: Self-pay | Admitting: Cardiovascular Disease

## 2013-11-04 DIAGNOSIS — N390 Urinary tract infection, site not specified: Secondary | ICD-10-CM | POA: Diagnosis not present

## 2013-11-04 NOTE — Telephone Encounter (Signed)
Rx refill sent to patient pharmacy   

## 2013-11-09 LAB — MDC_IDC_ENUM_SESS_TYPE_REMOTE

## 2013-11-16 ENCOUNTER — Encounter: Payer: Self-pay | Admitting: Cardiology

## 2013-11-19 ENCOUNTER — Encounter: Payer: Self-pay | Admitting: Cardiovascular Disease

## 2013-11-24 ENCOUNTER — Encounter: Payer: Self-pay | Admitting: Cardiovascular Disease

## 2013-11-24 ENCOUNTER — Ambulatory Visit (INDEPENDENT_AMBULATORY_CARE_PROVIDER_SITE_OTHER): Payer: Medicare Other | Admitting: *Deleted

## 2013-11-24 DIAGNOSIS — R55 Syncope and collapse: Secondary | ICD-10-CM | POA: Diagnosis not present

## 2013-11-24 LAB — MDC_IDC_ENUM_SESS_TYPE_REMOTE

## 2013-12-03 ENCOUNTER — Ambulatory Visit (INDEPENDENT_AMBULATORY_CARE_PROVIDER_SITE_OTHER): Payer: Medicare Other | Admitting: Cardiovascular Disease

## 2013-12-03 ENCOUNTER — Encounter: Payer: Self-pay | Admitting: Cardiovascular Disease

## 2013-12-03 VITALS — BP 118/64 | HR 54 | Ht 63.0 in | Wt 162.0 lb

## 2013-12-03 DIAGNOSIS — Z951 Presence of aortocoronary bypass graft: Secondary | ICD-10-CM

## 2013-12-03 DIAGNOSIS — I480 Paroxysmal atrial fibrillation: Secondary | ICD-10-CM

## 2013-12-03 DIAGNOSIS — I498 Other specified cardiac arrhythmias: Secondary | ICD-10-CM

## 2013-12-03 DIAGNOSIS — I10 Essential (primary) hypertension: Secondary | ICD-10-CM

## 2013-12-03 DIAGNOSIS — R55 Syncope and collapse: Secondary | ICD-10-CM

## 2013-12-03 DIAGNOSIS — E785 Hyperlipidemia, unspecified: Secondary | ICD-10-CM | POA: Diagnosis not present

## 2013-12-03 NOTE — Assessment & Plan Note (Signed)
Controlled on current medications 

## 2013-12-03 NOTE — Progress Notes (Signed)
12/03/2013 Stephanie Atkinson   08/11/1924  [id number]  Primary Physician Stephanie Atkinson,BRIAN, MD Primary Cardiologist: Stephanie Atkinson J. Stephanie Homann MD Stephanie RenoFACP,FACC,FAHA, FSCAI   HPI:  80y/o with a history of CAD who I last saw in the office one year ago.. She had CABG X 4 in 3/06. She had a DES placed to her PDA in July 2012, this was patent on follow up cath in Oct 2012 and recently 10/12/12. She did have distal disease that is to be treated medically. She h in the office 07/10/12 and was noted to have a long 1st degree AVB on her EKG, her PR was > 4. She was asymptomatic. She has a history of PAF since her CABG in 2006 and had been on Toprol, Amiodarone, ASA, and Plavix. At that time we stopped her Toprol and decreased her Amiodarone. When she was seen in follow up 07/29/12 her PR remained prolonged her Amiodarone was discontinued. On 10/06/12 she was seen and noted to be in AF with CVR. She was placed on Eloquis and her Plavix was Stopped. She the was admitted a few days later with chest pain with subtle EKG changes. She was cathed 10/12/12 and her anatomy was unchanged. She was discharge but readmitted later the 19th after a syncopal spell. She had a loop recorder implanted 10/14/12. She was noted to be mildly dehydrated and this, as well as new nitrate therapy, may have contributed to her syncope. . She has had no angina or syncope.  She had a loop recorder implanted which has been followed by Dr. Royann Atkinson was recently 3 months ago that showed no evidence of tachycardia or bradycardia arrhythmias. She's had no further episodes of syncope. She denies chest pain or shortness of breath..    Current Outpatient Prescriptions  Medication Sig Dispense Refill  . acetaminophen (TYLENOL) 325 MG tablet Take 650 mg by mouth every 4 (four) hours as needed. For pain      . alum & mag hydroxide-simeth (MAALOX/MYLANTA) 200-200-20 MG/5ML suspension Take 15 mLs by mouth every 2 (two) hours as needed.      [name]. amLODipine (NORVASC) 5 MG  tablet Take 5 mg by mouth daily.      [name]. apixaban (ELIQUIS) 2.5 MG TABS tablet Take 1 tablet (2.5 mg total) by mouth 2 (two) times daily.  60 tablet  11  . aspirin EC 81 MG EC tablet Take 1 tablet (81 mg total) by mouth daily.      [name]. atorvastatin (LIPITOR) 80 MG tablet Take 80 mg by mouth daily.      . furosemide (LASIX) 40 MG tablet Take 40 mg by mouth daily as needed. For fluid retention      . levothyroxine (SYNTHROID, LEVOTHROID) 50 MCG tablet Take 50 mcg by mouth daily.      . nitrofurantoin (MACRODANTIN) 100 MG capsule Take 100 mg by mouth every 12 (twelve) hours as needed.      . nitroGLYCERIN (NITROSTAT) 0.4 MG SL tablet Place 0.4 mg under the tongue every 5 (five) minutes as needed. For chest pain      . traMADol (ULTRAM) 50 MG tablet Take 50 mg by mouth every 6 (six) hours as needed for pain.      . valsartan (DIOVAN) 320 MG tablet TAKE 1 TABLET BY MOUTH EVERY DAY  30 tablet  11   No current facility-administered medications for this visit.    Allergies  Allergen Reactions  . Penicillins Hives  . Sulfa Antibiotics Swelling  History   Social History  . Marital Status: Single    Spouse Name: N/A    Number of Children: N/A  . Years of Education: N/A   Occupational History  . Not on file.   Social History Main Topics  . Smoking status: Never Smoker   . Smokeless tobacco: Current User    Types: Snuff  . Alcohol Use: No  . Drug Use: No  . Sexual Activity: Not on file   Other Topics Concern  . Not on file   Social History Narrative  . No narrative on file     Review of Systems: General: negative for chills, fever, night sweats or weight changes.  Cardiovascular: negative for chest pain, dyspnea on exertion, edema, orthopnea, palpitations, paroxysmal nocturnal dyspnea or shortness of breath Dermatological: negative for rash Respiratory: negative for cough or wheezing Urologic: negative for hematuria Abdominal: negative for nausea, vomiting, diarrhea, bright red  blood per rectum, melena, or hematemesis Neurologic: negative for visual changes, syncope, or dizziness All other systems reviewed and are otherwise negative except as noted above.    Blood pressure 118/64, pulse 54, height 5\' 3"  (1.6 m), weight 162 lb (73.483 kg).  General appearance: alert and no distress Neck: no adenopathy, no carotid bruit, no JVD, supple, symmetrical, trachea midline and thyroid not enlarged, symmetric, no tenderness/mass/nodules Lungs: clear to auscultation bilaterally Heart: regular rate and rhythm, S1, S2 normal, no murmur, click, rub or gallop Extremities: extremities normal, atraumatic, no cyanosis or edema  EKG sinus bradycardia at 54 with evidence of LVH with repolarization changes unchanged from prior EKGs  ASSESSMENT AND PLAN:   Hypertension Controlled on current medications  Hyperlipidemia On statin therapy followed by her PCP  Hx of CABG X 4 3/06-  History of CAD status post coronary artery bypass grafting x4 of March 2006. She had DES stent placed to her PDA July 2012 with followup cath performed in October of that year and as well as August 2014 with distal disease documented that was treated medically. She denies chest pain or shortness of breath.  PAF history History of PAF on Eliquis oral anticoagulation. She does have a loop recorder implanted which was interrogated by Dr. Royann Atkinson 08/03/13 which revealed an A. Fib burden of 7.2% with good ventricular rate control. There were no tachycardia or bradycardia arrhythmias that would contribute to syncope.  Syncope and collapse History of syncope in the past. She has a history of paroxysmal atrial fibrillation on oral anticoagulation. She's had a loop recorder implanted with recent followup her former doctor Stephanie Atkinson 08/03/13 that did not show any tachycardia or bradycardia arrhythmias.      Stephanie Atkinson J. Stephanie Lasure MD FACP,FACC,FAHA, [facility] CorporationFSCAI 12/03/2013 11:37 AM

## 2013-12-03 NOTE — Assessment & Plan Note (Signed)
On statin therapy followed by her PCP 

## 2013-12-03 NOTE — Assessment & Plan Note (Signed)
History of CAD status post coronary artery bypass grafting x4 of March 2006. She had DES stent placed to her PDA July 2012 with followup cath performed in October of that year and as well as August 2014 with distal disease documented that was treated medically. She denies chest pain or shortness of breath.

## 2013-12-03 NOTE — Assessment & Plan Note (Signed)
History of syncope in the past. She has a history of paroxysmal atrial fibrillation on oral anticoagulation. She's had a loop recorder implanted with recent followup her former doctor Croitoru 08/03/13 that did not show any tachycardia or bradycardia arrhythmias.

## 2013-12-03 NOTE — Patient Instructions (Signed)
We request that you follow-up in: 6 months with Luke and in 1 year with Dr Berry  You will receive a reminder letter in the mail two months in advance. If you don't receive a letter, please call our office to schedule the follow-up appointment.   

## 2013-12-03 NOTE — Assessment & Plan Note (Signed)
History of PAF on Eliquis oral anticoagulation. She does have a loop recorder implanted which was interrogated by Dr. Royann Shiversroitoru 08/03/13 which revealed an A. Fib burden of 7.2% with good ventricular rate control. There were no tachycardia or bradycardia arrhythmias that would contribute to syncope.

## 2013-12-03 NOTE — Progress Notes (Signed)
Loop recorder 

## 2013-12-13 DIAGNOSIS — Z23 Encounter for immunization: Secondary | ICD-10-CM | POA: Diagnosis not present

## 2013-12-13 DIAGNOSIS — R0789 Other chest pain: Secondary | ICD-10-CM | POA: Diagnosis not present

## 2013-12-13 DIAGNOSIS — N390 Urinary tract infection, site not specified: Secondary | ICD-10-CM | POA: Diagnosis not present

## 2013-12-14 ENCOUNTER — Encounter: Payer: Self-pay | Admitting: Cardiovascular Disease

## 2013-12-24 ENCOUNTER — Ambulatory Visit (INDEPENDENT_AMBULATORY_CARE_PROVIDER_SITE_OTHER): Payer: Medicare Other | Admitting: *Deleted

## 2013-12-24 DIAGNOSIS — R55 Syncope and collapse: Secondary | ICD-10-CM

## 2013-12-24 LAB — MDC_IDC_ENUM_SESS_TYPE_REMOTE

## 2013-12-29 NOTE — Progress Notes (Signed)
Loop recorder 

## 2013-12-31 ENCOUNTER — Ambulatory Visit (INDEPENDENT_AMBULATORY_CARE_PROVIDER_SITE_OTHER): Payer: Medicare Other | Admitting: *Deleted

## 2013-12-31 DIAGNOSIS — R55 Syncope and collapse: Secondary | ICD-10-CM | POA: Diagnosis not present

## 2014-01-04 DIAGNOSIS — R1011 Right upper quadrant pain: Secondary | ICD-10-CM | POA: Diagnosis not present

## 2014-01-05 ENCOUNTER — Other Ambulatory Visit: Payer: Self-pay | Admitting: Internal Medicine

## 2014-01-05 DIAGNOSIS — R1011 Right upper quadrant pain: Secondary | ICD-10-CM

## 2014-01-13 ENCOUNTER — Encounter (INDEPENDENT_AMBULATORY_CARE_PROVIDER_SITE_OTHER): Payer: Self-pay

## 2014-01-13 ENCOUNTER — Ambulatory Visit
Admission: RE | Admit: 2014-01-13 | Discharge: 2014-01-13 | Disposition: A | Discharge status: Home / Self Care | Payer: Medicare Other | Source: Ambulatory Visit | Attending: Internal Medicine | Admitting: Internal Medicine

## 2014-01-13 DIAGNOSIS — R1011 Right upper quadrant pain: Secondary | ICD-10-CM

## 2014-01-13 DIAGNOSIS — Z9049 Acquired absence of other specified parts of digestive tract: Secondary | ICD-10-CM | POA: Diagnosis not present

## 2014-01-28 NOTE — Progress Notes (Signed)
Loop recorder 

## 2014-01-31 LAB — MDC_IDC_ENUM_SESS_TYPE_REMOTE

## 2014-02-01 ENCOUNTER — Encounter: Payer: Self-pay | Admitting: Cardiovascular Disease

## 2014-02-01 ENCOUNTER — Ambulatory Visit (INDEPENDENT_AMBULATORY_CARE_PROVIDER_SITE_OTHER): Payer: Medicare Other | Admitting: Cardiovascular Disease

## 2014-02-01 VITALS — BP 150/80 | HR 64 | Resp 29 | Ht 63.0 in | Wt 162.7 lb

## 2014-02-01 DIAGNOSIS — Z8679 Personal history of other diseases of the circulatory system: Secondary | ICD-10-CM

## 2014-02-01 DIAGNOSIS — I4430 Unspecified atrioventricular block: Secondary | ICD-10-CM

## 2014-02-01 DIAGNOSIS — I10 Essential (primary) hypertension: Secondary | ICD-10-CM | POA: Diagnosis not present

## 2014-02-01 DIAGNOSIS — I498 Other specified cardiac arrhythmias: Secondary | ICD-10-CM

## 2014-02-01 DIAGNOSIS — Z4509 Encounter for adjustment and management of other cardiac device: Secondary | ICD-10-CM | POA: Insufficient documentation

## 2014-02-01 DIAGNOSIS — I480 Paroxysmal atrial fibrillation: Secondary | ICD-10-CM

## 2014-02-01 DIAGNOSIS — R55 Syncope and collapse: Secondary | ICD-10-CM | POA: Diagnosis not present

## 2014-02-01 NOTE — Patient Instructions (Signed)
Dr Royann Shiversroitoru wants you to follow-up in 1 year with device check. You will receive a reminder letter in the mail two months in advance. If you don't receive a letter, please call our office to schedule the follow-up appointment.

## 2014-02-01 NOTE — Progress Notes (Signed)
Patient ID: Stephanie Atkinson, female   DOB: Sep 16, 1924, 80 y.o.   MRN: [id number]     Reason for office visit History of syncope, recurrent paroxysmal atrial fibrillation  Stephanie Atkinson has a history of 2 episodes of syncope that seemed to occur in the setting of hypovolemia and treatment with nitrates. However she also had evidence of amiodarone and beta blocker induced atrioventricular block and received a loop recorder roughly 2 years ago. Since implantation the device has shown a very high burden of paroxysmal atrial fibrillation, on today's check 21.4%. The ventricular rate is uniformly well controlled, despite not being on any rate control medications. The episodes are asymptomatic. There has been no evidence of any bradycardia arrhythmia that could cause syncope. Occasional "pauses" recorded by the device are secondary to under sensed PVCs.  She also has a history of coronary artery disease status post bypass surgery in 2006 with subsequent percutaneous revascularization procedures. Most recently she had cardiac catheterization in August 2014 that did not lead to any intervention. One of her syncopal events actually occurred the day after that last catheterization. She has normal left ventricular systolic function.  She has not had syncope and since loop recorder implantation.   She is on appropriate anticoagulation with Eliquis and has not had any serious bleeding complications or any neurological events. Allergies  Allergen Reactions  . Penicillins Hives  . Sulfa Antibiotics Swelling    Current Outpatient Prescriptions  Medication Sig Dispense Refill  . acetaminophen (TYLENOL) 325 MG tablet Take 650 mg by mouth every 4 (four) hours as needed. For pain    . alum & mag hydroxide-simeth (MAALOX/MYLANTA) 200-200-20 MG/5ML suspension Take 15 mLs by mouth every 2 (two) hours as needed.    [name]. amLODipine (NORVASC) 5 MG tablet Take 5 mg by mouth daily.    [name]. apixaban (ELIQUIS) 2.5 MG TABS tablet  Take 1 tablet (2.5 mg total) by mouth 2 (two) times daily. 60 tablet 11  . aspirin EC 81 MG EC tablet Take 1 tablet (81 mg total) by mouth daily.    [name]. atorvastatin (LIPITOR) 80 MG tablet Take 80 mg by mouth daily.    . furosemide (LASIX) 40 MG tablet Take 40 mg by mouth daily as needed. For fluid retention    . levothyroxine (SYNTHROID, LEVOTHROID) 50 MCG tablet Take 50 mcg by mouth daily.    . nitrofurantoin (MACRODANTIN) 100 MG capsule Take 100 mg by mouth every 12 (twelve) hours as needed.    . nitroGLYCERIN (NITROSTAT) 0.4 MG SL tablet Place 0.4 mg under the tongue every 5 (five) minutes as needed. For chest pain    . traMADol (ULTRAM) 50 MG tablet Take 50 mg by mouth every 6 (six) hours as needed for pain.    . valsartan (DIOVAN) 320 MG tablet TAKE 1 TABLET BY MOUTH EVERY DAY 30 tablet 11   No current facility-administered medications for this visit.    Past Medical History  Diagnosis Date  . Hypertension   . Hx of CABG 2006    X4  . MI, old     "mild"  . Hyperlipidemia   . Presence of stent in right coronary artery 07/12    RIMA-PDA occluded, DES stent placed  . PAF (paroxysmal atrial fibrillation)     on AMIO  . Syncope 07/12    bradycardic, beta blocker decreased  . Chronic kidney disease, stage 3   . Hypothyroid   . Coronary artery disease     Past Surgical History  Procedure Laterality Date  . Carotid stent      2012  . Cardiac catheterization  [id number]    dr. Onalee Hua harding, revealing a atretic bypass to her right with patent distal RCA stent  . Doppler echocardiography  [id number]    mild asymmetric left ventricular hypertrophy, left ventricular systolic function is low normal, ejection fraction = 50-55%, the LA is moderate dilated, the RA is mildly dilated, no significant valvular disease  . Nm myoview ltd  100510    post stress left ventricle is normal in size, post stress ejection fraction is 67% global left ventricular systolic function is normal, normal myocardial  perfusion study, abnormal myocardial perfusion study, low risk scan    Family History  Problem Relation Age of Onset  . CAD Mother   . CAD Maternal Grandmother     History   Social History  . Marital Status: Single    Spouse Name: N/A    Number of Children: N/A  . Years of Education: N/A   Occupational History  . Not on file.   Social History Main Topics  . Smoking status: Never Smoker   . Smokeless tobacco: Current User    Types: Snuff  . Alcohol Use: No  . Drug Use: No  . Sexual Activity: Not on file   Other Topics Concern  . Not on file   Social History Narrative    Review of systems: The patient specifically denies any chest pain at rest or with usual exertion, dyspnea at rest or with exertion, orthopnea, paroxysmal nocturnal dyspnea, syncope, palpitations, focal neurological deficits, intermittent claudication, lower extremity edema, unexplained weight gain, cough, hemoptysis or wheezing.  The patient also denies abdominal pain, nausea, vomiting, dysphagia, diarrhea, constipation, polyuria, polydipsia, dysuria, hematuria, frequency, urgency, abnormal bleeding or bruising, fever, chills, unexpected weight changes, mood swings, change in skin or hair texture, change in voice quality, auditory or visual problems, allergic reactions or rashes, new musculoskeletal complaints other than usual "aches and pains".   PHYSICAL EXAM BP 150/80 mmHg  Pulse 64  Resp 29  Ht 5\' 3"  (1.6 m)  Wt 162 lb 11.2 oz (73.8 kg)  BMI 28.83 kg/m2 General: Alert, oriented x3, no distress Head: no evidence of trauma, PERRL, EOMI, no exophtalmos or lid lag, no myxedema, no xanthelasma; normal ears, nose and oropharynx Neck: normal jugular venous pulsations and no hepatojugular reflux; brisk carotid pulses without delay and no carotid bruits Chest: clear to auscultation, no signs of consolidation by percussion or palpation, normal fremitus, symmetrical and full respiratory excursions; healthy  sternotomy and loop recorder site Cardiovascular: normal position and quality of the apical impulse, regular rhythm, normal first and second heart sounds, no murmurs, rubs or gallops Abdomen: no tenderness or distention, no masses by palpation, no abnormal pulsatility or arterial bruits, normal bowel sounds, no hepatosplenomegaly Extremities: no clubbing, cyanosis or edema; 2+ radial, ulnar and brachial pulses bilaterally; 2+ right femoral, posterior tibial and dorsalis pedis pulses; 2+ left femoral, posterior tibial and dorsalis pedis pulses; no subclavian or femoral bruits Neurological: grossly nonfocal   EKG: Sinus rhythm, first degree AV block   Lipid Panel     Component Value Date/Time   CHOL 147 12/11/2010 0901   TRIG 84 12/11/2010 0901   HDL 56 12/11/2010 0901   CHOLHDL 2.6 12/11/2010 0901   VLDL 17 12/11/2010 0901   LDLCALC 74 12/11/2010 0901    BMET    Component Value Date/Time   NA 141 10/15/2012 0425   K 3.8 10/15/2012  0425   CL 110 10/15/2012 0425   CO2 23 10/15/2012 0425   GLUCOSE 95 10/15/2012 0425   BUN 18 10/15/2012 0425   CREATININE 0.99 10/15/2012 0425   CALCIUM 9.7 10/15/2012 0425   GFRNONAA 50* 10/15/2012 0425   GFRAA 58* 10/15/2012 0425     ASSESSMENT AND PLAN  Frequent recurrent paroxysmal atrial fibrillation Spontaneous ventricular rate control, the presence of first-degree AV block during sinus rhythm and the occurrence of syncope while on treatment with negative chronotropic agents all suggest that she has significant AV node conduction disease. She does not require pacemaker treatment at this time but chronotropic negative medications should be avoided. Continue full anticoagulation.  History of syncope Normally functioning implantable loop recorder without meaningful episodes of bradyarrhythmia since implantation.  Her blood pressure is mildly elevated today but this is unusual for her and no changes are made her medications. She has no  symptoms of active coronary insufficiency.  Follow-up with Dr. Nanetta BattyJonathan Berry for CAD and PAD [facility],Novella Abraha  Thurmon FairMihai Zyrah Wiswell, MD, [facility] [phone] office [phone] pager

## 2014-02-03 ENCOUNTER — Encounter (HOSPITAL_COMMUNITY): Payer: Self-pay | Admitting: Cardiology

## 2014-02-04 LAB — MDC_IDC_ENUM_SESS_TYPE_INCLINIC

## 2014-02-14 DIAGNOSIS — I10 Essential (primary) hypertension: Secondary | ICD-10-CM | POA: Diagnosis not present

## 2014-02-14 DIAGNOSIS — E039 Hypothyroidism, unspecified: Secondary | ICD-10-CM | POA: Diagnosis not present

## 2014-02-14 DIAGNOSIS — M859 Disorder of bone density and structure, unspecified: Secondary | ICD-10-CM | POA: Diagnosis not present

## 2014-02-21 DIAGNOSIS — E785 Hyperlipidemia, unspecified: Secondary | ICD-10-CM | POA: Diagnosis not present

## 2014-02-21 DIAGNOSIS — I10 Essential (primary) hypertension: Secondary | ICD-10-CM | POA: Diagnosis not present

## 2014-02-21 DIAGNOSIS — E039 Hypothyroidism, unspecified: Secondary | ICD-10-CM | POA: Diagnosis not present

## 2014-02-21 DIAGNOSIS — I2510 Atherosclerotic heart disease of native coronary artery without angina pectoris: Secondary | ICD-10-CM | POA: Diagnosis not present

## 2014-03-01 ENCOUNTER — Encounter: Payer: Self-pay | Admitting: Cardiovascular Disease

## 2014-04-15 ENCOUNTER — Telehealth: Payer: Self-pay | Admitting: Cardiology

## 2014-04-15 NOTE — Telephone Encounter (Signed)
Spoke with pt and attempted to help her trouble shoot monitor. She stated that the monitor would not come on. Instructed pt to call tech services. Pt verbalized understanding.

## 2014-04-25 ENCOUNTER — Ambulatory Visit (INDEPENDENT_AMBULATORY_CARE_PROVIDER_SITE_OTHER): Payer: Medicare Other | Admitting: *Deleted

## 2014-04-25 DIAGNOSIS — R55 Syncope and collapse: Secondary | ICD-10-CM | POA: Diagnosis not present

## 2014-04-28 ENCOUNTER — Telehealth: Payer: Self-pay | Admitting: Cardiovascular Disease

## 2014-04-28 NOTE — Telephone Encounter (Signed)
Called patient, Ms. Stephanie Atkinson. Patient reports on Tuesday nite she noticed a blood vessel has burst in her eye. She states her eye was red, with a "little red ball under eyelid", but has improved and is just a pink color in eye.   Her daughter had called in wanting to get patient appmt with Dr. Allyson SabalBerry since she is a "heart patient" and I assume since she is on Eliquis. Patient states she will see her eye doctor this afternoon. She was advised to keep this appointment.   Patient denies eye pain, denies headaches, denies vision changes, denies sinus pressure/congestion. She reports her BP was a little higher last week (does not check on daily basis).   Informed patient is appears her issue is progressively improving. She agreed.   Will route to DOD for any further advice if appropriate

## 2014-04-28 NOTE — Telephone Encounter (Signed)
Stephanie HongJudy called in stating that the blood vessal in her eye burst on Tuesday night and she would like for her mother to be seen as soon as possible to get it checked out. Please f/u  Thanks

## 2014-04-28 NOTE — Telephone Encounter (Signed)
Spoke to daughter- informed her unable to discuss information ( no DPR) She states she should, will handle it when they come in. She states she spoke to her mother- and mother stated someone contacted her.

## 2014-04-28 NOTE — Progress Notes (Signed)
Loop recorder 

## 2014-04-29 DIAGNOSIS — M1711 Unilateral primary osteoarthritis, right knee: Secondary | ICD-10-CM | POA: Diagnosis not present

## 2014-04-29 DIAGNOSIS — M5440 Lumbago with sciatica, unspecified side: Secondary | ICD-10-CM | POA: Diagnosis not present

## 2014-05-11 LAB — MDC_IDC_ENUM_SESS_TYPE_REMOTE
Date Time Interrogation Session: 20160219184642
MDC IDC SET ZONE DETECTION INTERVAL: 2000 ms
MDC IDC SET ZONE DETECTION INTERVAL: 3000 ms
Zone Setting Detection Interval: 420 ms

## 2014-05-16 ENCOUNTER — Encounter: Payer: Self-pay | Admitting: Cardiovascular Disease

## 2014-05-20 ENCOUNTER — Encounter: Payer: Self-pay | Admitting: Cardiology

## 2014-05-24 ENCOUNTER — Ambulatory Visit (INDEPENDENT_AMBULATORY_CARE_PROVIDER_SITE_OTHER): Payer: Medicare Other | Admitting: *Deleted

## 2014-05-24 DIAGNOSIS — R55 Syncope and collapse: Secondary | ICD-10-CM

## 2014-05-24 LAB — MDC_IDC_ENUM_SESS_TYPE_REMOTE
MDC IDC SESS DTM: 20160331194254
MDC IDC SET ZONE DETECTION INTERVAL: 2000 ms
Zone Setting Detection Interval: 3000 ms
Zone Setting Detection Interval: 420 ms

## 2014-05-24 NOTE — Progress Notes (Signed)
Loop recorder 

## 2014-05-26 ENCOUNTER — Telehealth: Payer: Self-pay | Admitting: Cardiovascular Disease

## 2014-05-26 NOTE — Telephone Encounter (Signed)
New Message  Pt wants to know what to do going forward after receiving letter. Please call back and discuss.

## 2014-05-26 NOTE — Telephone Encounter (Signed)
Instructed pt how to send manual transmission.  

## 2014-05-27 ENCOUNTER — Encounter: Payer: Self-pay | Admitting: Cardiovascular Disease

## 2014-06-13 ENCOUNTER — Encounter: Payer: Self-pay | Admitting: Cardiovascular Disease

## 2014-06-23 ENCOUNTER — Ambulatory Visit (INDEPENDENT_AMBULATORY_CARE_PROVIDER_SITE_OTHER): Payer: Medicare Other | Admitting: *Deleted

## 2014-06-23 DIAGNOSIS — R55 Syncope and collapse: Secondary | ICD-10-CM

## 2014-06-24 ENCOUNTER — Encounter: Payer: Self-pay | Admitting: Cardiovascular Disease

## 2014-06-24 DIAGNOSIS — N390 Urinary tract infection, site not specified: Secondary | ICD-10-CM | POA: Diagnosis not present

## 2014-06-24 DIAGNOSIS — R300 Dysuria: Secondary | ICD-10-CM | POA: Diagnosis not present

## 2014-06-28 NOTE — Progress Notes (Signed)
Loop recorder 

## 2014-07-18 ENCOUNTER — Encounter: Payer: Self-pay | Admitting: Cardiology

## 2014-07-18 ENCOUNTER — Ambulatory Visit (INDEPENDENT_AMBULATORY_CARE_PROVIDER_SITE_OTHER): Payer: Medicare Other | Admitting: Cardiology

## 2014-07-18 VITALS — BP 122/74 | HR 46 | Ht 63.0 in | Wt 161.2 lb

## 2014-07-18 DIAGNOSIS — Z955 Presence of coronary angioplasty implant and graft: Secondary | ICD-10-CM

## 2014-07-18 DIAGNOSIS — N183 Chronic kidney disease, stage 3 unspecified: Secondary | ICD-10-CM

## 2014-07-18 DIAGNOSIS — I480 Paroxysmal atrial fibrillation: Secondary | ICD-10-CM

## 2014-07-18 DIAGNOSIS — I498 Other specified cardiac arrhythmias: Secondary | ICD-10-CM | POA: Diagnosis not present

## 2014-07-18 DIAGNOSIS — Z951 Presence of aortocoronary bypass graft: Secondary | ICD-10-CM | POA: Diagnosis not present

## 2014-07-18 DIAGNOSIS — R001 Bradycardia, unspecified: Secondary | ICD-10-CM

## 2014-07-18 DIAGNOSIS — I10 Essential (primary) hypertension: Secondary | ICD-10-CM

## 2014-07-18 DIAGNOSIS — E785 Hyperlipidemia, unspecified: Secondary | ICD-10-CM

## 2014-07-18 DIAGNOSIS — Z7901 Long term (current) use of anticoagulants: Secondary | ICD-10-CM

## 2014-07-18 MED ORDER — APIXABAN 2.5 MG PO TABS: 2.5000 mg | ORAL_TABLET | Freq: Two times a day (BID) | ORAL | Status: DC

## 2014-07-18 NOTE — Assessment & Plan Note (Signed)
Tolerating this well 

## 2014-07-18 NOTE — Assessment & Plan Note (Signed)
Controlled.  

## 2014-07-18 NOTE — Assessment & Plan Note (Signed)
Followed by Dr Ronne BinningMcKenzie

## 2014-07-18 NOTE — Progress Notes (Signed)
   Patient ID: Stephanie Atkinson, female    DOB: [birth year], 81 y.o.   MRN: [id number]  HPI    Review of Systems    Physical Exam     We will need to see he renal function to make sure she is on the correct dose of Eliquis.  Corine ShelterLUKE Lorieann Argueta PA-C 07/18/2014 11:55 AM

## 2014-07-18 NOTE — Assessment & Plan Note (Signed)
Appears to be in permanentt AF. No symptomatic slow

## 2014-07-18 NOTE — Assessment & Plan Note (Signed)
No labs in Epic going back to 2014

## 2014-07-18 NOTE — Patient Instructions (Addendum)
   Dr. Allyson SabalBerry will see you in October and Dr. Royann Shiversroitoru in December.

## 2014-07-18 NOTE — Progress Notes (Signed)
07/18/2014 Stephanie Atkinson   02/26/1924  [id number]  Primary Physician Thayer HeadingsMACKENZIE,BRIAN, MD Primary Cardiologist: Dr Allyson SabalBerry/ Dr Geralyn Flashroitrou  HPI:  81 y/o with a history of CAD. She had CABG X 4 in 3/06. She had a DES placed to her PDA in July 2012, this was patent on follow up cath in Oct 2012 and 10/12/12. She did have distal disease that is to be treated medically. She had been seen in the office 07/10/12 and was noted to have a long 1st degree AVB on her EKG, her PR was > 4. She was asymptomatic. She has a history of PAF since her CABG in 2006 and had been on Toprol, Amiodarone, ASA, and Plavix. Eventually her Amiodarone was discontinued. On 10/06/12 she was seen and noted to be in AF with CVR. She was placed on Eloquis and her Plavix was Stopped. She was admitted with a syncopal spell in Aug 2014 and underwent loop recorder implant 10/14/12. She was noted to be mildly dehydrated and this, as well as new nitrate therapy, may have contributed to her syncope. She is in the office today for follow up. She is in AF with VR 46. She is asymptomatic.    Current Outpatient Prescriptions  Medication Sig Dispense Refill  . acetaminophen (TYLENOL) 325 MG tablet Take 650 mg by mouth every 4 (four) hours as needed. For pain    . alum & mag hydroxide-simeth (MAALOX/MYLANTA) 200-200-20 MG/5ML suspension Take 15 mLs by mouth every 2 (two) hours as needed.    [name]. amLODipine (NORVASC) 5 MG tablet Take 5 mg by mouth daily.    [name]. apixaban (ELIQUIS) 2.5 MG TABS tablet Take 1 tablet (2.5 mg total) by mouth 2 (two) times daily. 60 tablet 11  . aspirin EC 81 MG EC tablet Take 1 tablet (81 mg total) by mouth daily.    [name]. atorvastatin (LIPITOR) 80 MG tablet Take 80 mg by mouth daily.    . furosemide (LASIX) 40 MG tablet Take 40 mg by mouth daily as needed. For fluid retention    . levothyroxine (SYNTHROID, LEVOTHROID) 50 MCG tablet Take 50 mcg by mouth daily.    . nitrofurantoin (MACRODANTIN) 100 MG capsule Take 100 mg by mouth  every 12 (twelve) hours as needed.    . nitroGLYCERIN (NITROSTAT) 0.4 MG SL tablet Place 0.4 mg under the tongue every 5 (five) minutes as needed. For chest pain    . traMADol (ULTRAM) 50 MG tablet Take 50 mg by mouth every 6 (six) hours as needed for pain.    . valsartan (DIOVAN) 320 MG tablet TAKE 1 TABLET BY MOUTH EVERY DAY 30 tablet 11   No current facility-administered medications for this visit.    Allergies  Allergen Reactions  . Penicillins Hives  . Sulfa Antibiotics Swelling    History   Social History  . Marital Status: Single    Spouse Name: N/A  . Number of Children: N/A  . Years of Education: N/A   Occupational History  . Not on file.   Social History Main Topics  . Smoking status: Never Smoker   . Smokeless tobacco: Current User    Types: Snuff  . Alcohol Use: No  . Drug Use: No  . Sexual Activity: Not on file   Other Topics Concern  . Not on file   Social History Narrative     Review of Systems: General: negative for chills, fever, night sweats or weight changes.  Cardiovascular: negative for chest pain, dyspnea  on exertion, edema, orthopnea, palpitations, paroxysmal nocturnal dyspnea or shortness of breath Dermatological: negative for rash Respiratory: negative for cough or wheezing Urologic: negative for hematuria Abdominal: negative for nausea, vomiting, diarrhea, bright red blood per rectum, melena, or hematemesis Neurologic: negative for visual changes, syncope, or dizziness All other systems reviewed and are otherwise negative except as noted above.    Blood pressure 122/74, pulse 46, height  (1.6 m), weight 161 lb 3.2 oz (73.12 kg).  General appearance: alert, cooperative and no distress Neck: no carotid bruit and no JVD Lungs: clear to auscultation bilaterally Heart: irregularly irregular rhythm Extremities: extremities normal, atraumatic, no cyanosis or edema Neurologic: Grossly normal  EKG AF with slow VR, TWI inferior Lat leads  unchanged She was in NSR in Oct 2015  ASSESSMENT AND PLAN:   PAF history Appears to be in permanentt AF. No symptomatic slow    Hx of CABG X 4 3/06-  Last cath Aug 2014- medical Rx. No anginal symptoms   Chronic anticoagulation- Eloquis started and Plavix stopped 10/07/12 Tolerating this well   RCA DES placed 7/12- patent 8/14 .   Hypertension Controlled   Dyslipidemia Followed by Dr Ronne Binning   Chronic kidney disease, stage 3 No labs in Epic going back to 2014    PLAN  Same Rx. F/U Dr Allyson Sabal in Oct. Labs will be done by PCP next week, I have asked her to send [place] copies.   Corine Shelter K PA-C 07/18/2014 11:46 AM

## 2014-07-18 NOTE — Assessment & Plan Note (Signed)
Last cath Aug 2014- medical Rx. No anginal symptoms

## 2014-07-22 ENCOUNTER — Ambulatory Visit (INDEPENDENT_AMBULATORY_CARE_PROVIDER_SITE_OTHER): Payer: Medicare Other | Admitting: *Deleted

## 2014-07-22 DIAGNOSIS — R55 Syncope and collapse: Secondary | ICD-10-CM

## 2014-07-26 LAB — CUP PACEART REMOTE DEVICE CHECK
Date Time Interrogation Session: 20160523142652
Zone Setting Detection Interval: 2000 ms
Zone Setting Detection Interval: 3000 ms
Zone Setting Detection Interval: 420 ms

## 2014-08-02 LAB — CUP PACEART REMOTE DEVICE CHECK: Date Time Interrogation Session: 20160607101632

## 2014-08-02 NOTE — Progress Notes (Signed)
Loop recorder 

## 2014-08-03 DIAGNOSIS — M25562 Pain in left knee: Secondary | ICD-10-CM | POA: Diagnosis not present

## 2014-08-04 ENCOUNTER — Encounter: Payer: Self-pay | Admitting: Cardiovascular Disease

## 2014-08-08 ENCOUNTER — Encounter: Payer: Self-pay | Admitting: Cardiovascular Disease

## 2014-08-16 DIAGNOSIS — N390 Urinary tract infection, site not specified: Secondary | ICD-10-CM | POA: Diagnosis not present

## 2014-08-16 DIAGNOSIS — Z0000 Encounter for general adult medical examination without abnormal findings: Secondary | ICD-10-CM | POA: Diagnosis not present

## 2014-08-16 DIAGNOSIS — Z23 Encounter for immunization: Secondary | ICD-10-CM | POA: Diagnosis not present

## 2014-08-16 DIAGNOSIS — Z1389 Encounter for screening for other disorder: Secondary | ICD-10-CM | POA: Diagnosis not present

## 2014-08-16 DIAGNOSIS — M859 Disorder of bone density and structure, unspecified: Secondary | ICD-10-CM | POA: Diagnosis not present

## 2014-08-16 DIAGNOSIS — I10 Essential (primary) hypertension: Secondary | ICD-10-CM | POA: Diagnosis not present

## 2014-08-19 ENCOUNTER — Encounter: Payer: Self-pay | Admitting: Cardiovascular Disease

## 2014-08-22 ENCOUNTER — Ambulatory Visit (INDEPENDENT_AMBULATORY_CARE_PROVIDER_SITE_OTHER): Payer: Medicare Other | Admitting: *Deleted

## 2014-08-22 DIAGNOSIS — R55 Syncope and collapse: Secondary | ICD-10-CM

## 2014-08-23 DIAGNOSIS — E785 Hyperlipidemia, unspecified: Secondary | ICD-10-CM | POA: Diagnosis not present

## 2014-08-23 DIAGNOSIS — I2510 Atherosclerotic heart disease of native coronary artery without angina pectoris: Secondary | ICD-10-CM | POA: Diagnosis not present

## 2014-08-23 DIAGNOSIS — I130 Hypertensive heart and chronic kidney disease with heart failure and stage 1 through stage 4 chronic kidney disease, or unspecified chronic kidney disease: Secondary | ICD-10-CM | POA: Diagnosis not present

## 2014-08-23 DIAGNOSIS — E063 Autoimmune thyroiditis: Secondary | ICD-10-CM | POA: Diagnosis not present

## 2014-08-23 LAB — CUP PACEART REMOTE DEVICE CHECK: Date Time Interrogation Session: 20160628130244

## 2014-08-24 ENCOUNTER — Encounter: Payer: Self-pay | Admitting: Cardiovascular Disease

## 2014-08-24 NOTE — Progress Notes (Signed)
Loop recorder 

## 2014-08-25 IMAGING — CR DG CHEST 1V PORT
1 series · 1 of 1 positions shown · non-contrast
Comparison: 01/01/2012 and earlier.

CLINICAL DATA: 88-year-old female shortness of breath and chest
pain.

PORTABLE CHEST - 1 VIEW

[AP]
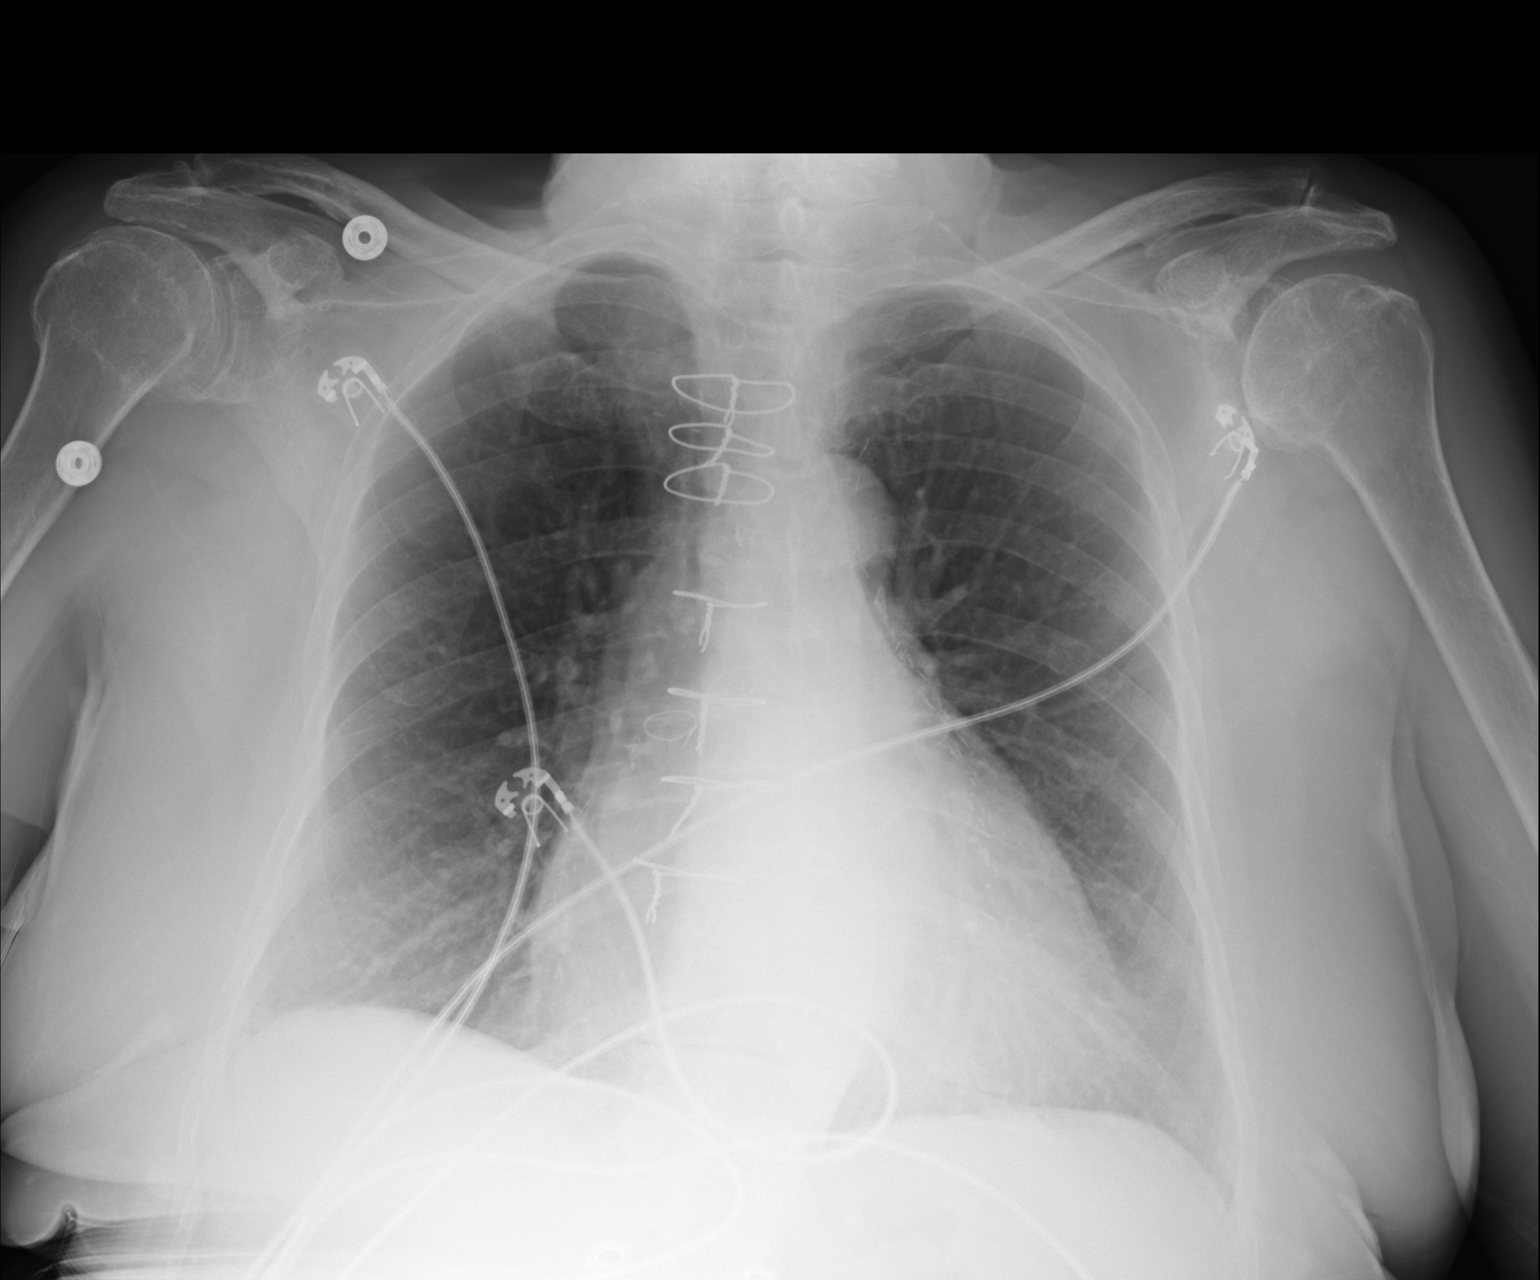

[1 of 1 positions shown; findings below may reference images not displayed]

FINDINGS: Portable upright AP view 5575 hours.  Mildly lower lung
volumes. Stable cardiomegaly and mediastinal contours.  Sequelae of
CABG.  Allowing for portable technique, the lungs are clear.
IMPRESSION: No acute cardiopulmonary abnormality.

## 2014-08-28 IMAGING — CT CT ABD-PELV W/ CM
2 of 5 series · 17 of 46 positions shown, 19 images · IV contrast (CONTRAST)
Comparison: CT 10/18/2010

CLINICAL DATA: Epigastric pain syncope.  Rule out retroperitoneal
bleed

CT ABDOMEN AND PELVIS WITH CONTRAST
TECHNIQUE: Multidetector CT imaging of the abdomen and pelvis was
performed following the standard protocol during bolus
administration of intravenous contrast.
Contrast: 80mL OMNIPAQUE IOHEXOL 300 MG/ML  SOLN

[Series 2: routine · axial · 0.84mm/px · z∈[+397,+777]mm · 14 of 86 slices shown, 16 images]
[im 5/86  soft-tissue]
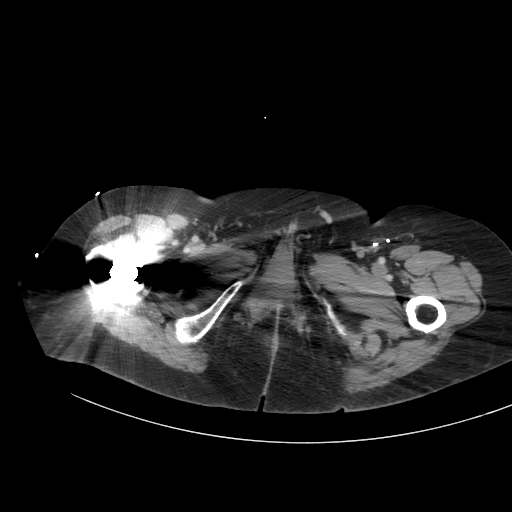
[im 5/86  bone]
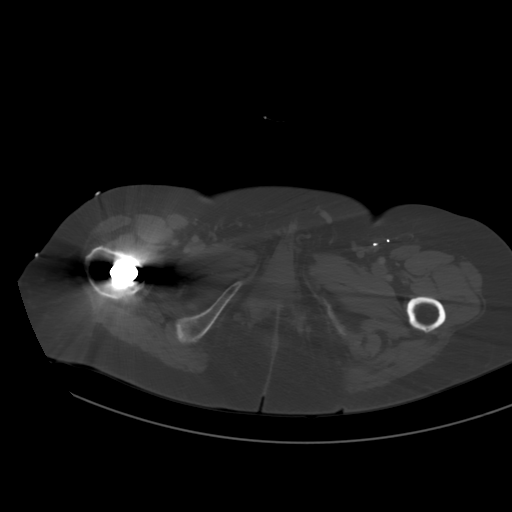
[im 9/86  soft-tissue]
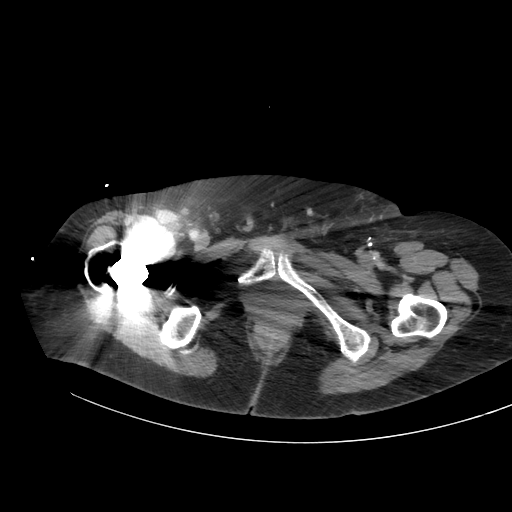
[im 18/86  soft-tissue]
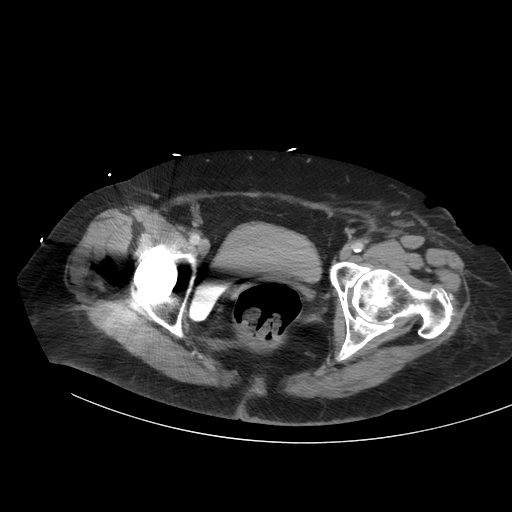
[im 23/86  soft-tissue]
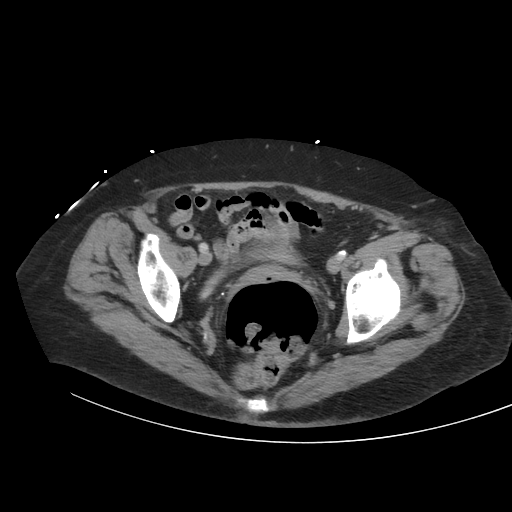
[im 27/86  soft-tissue]
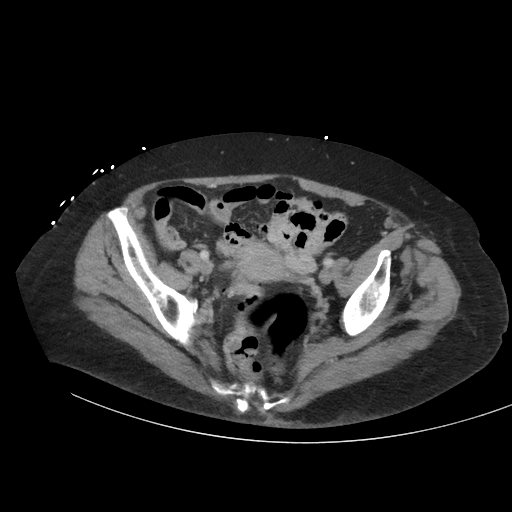
[im 36/86  soft-tissue]
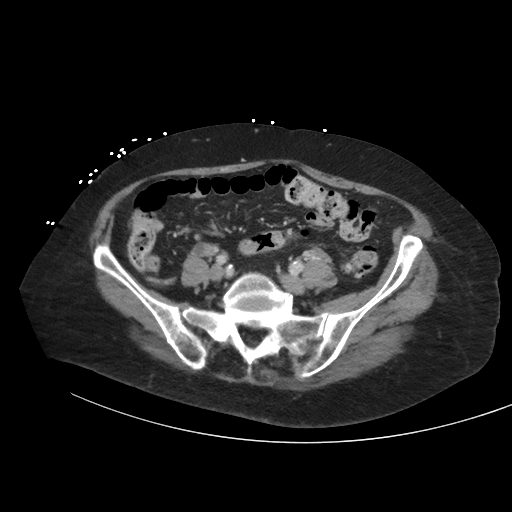
[im 41/86  soft-tissue]
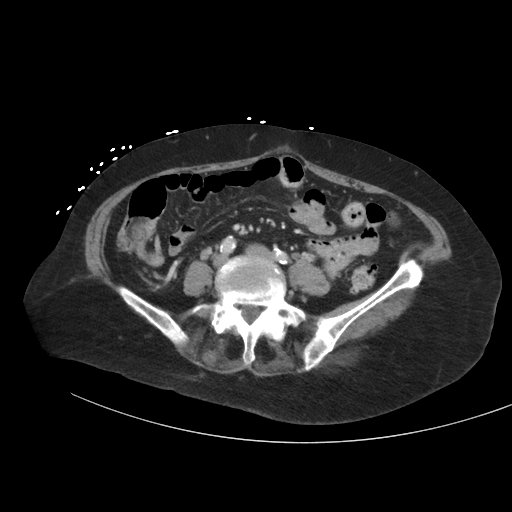
[im 45/86  soft-tissue]
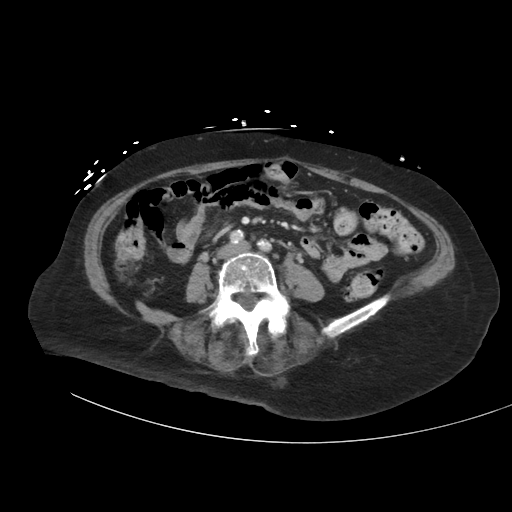
[im 50/86  soft-tissue]
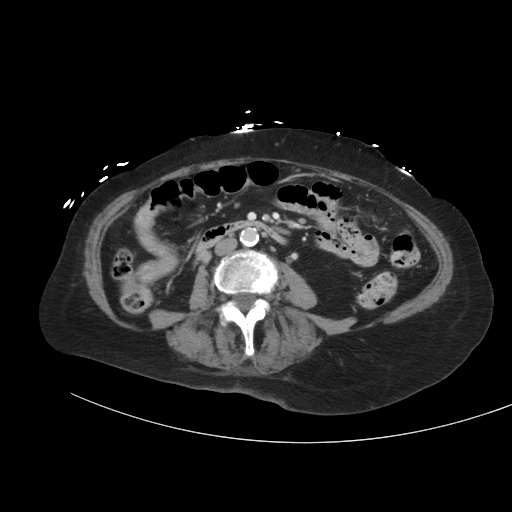
[im 50/86  bone]
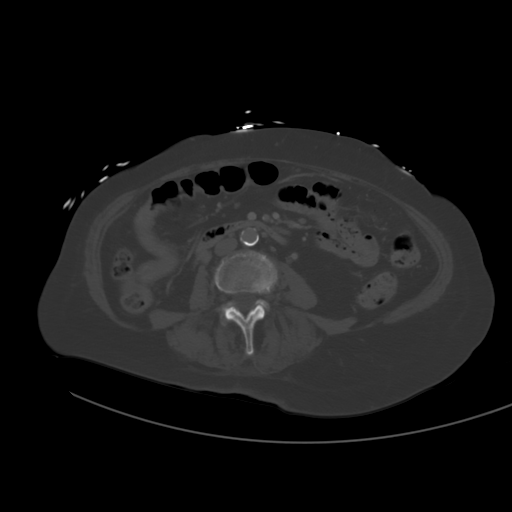
[im 59/86  soft-tissue]
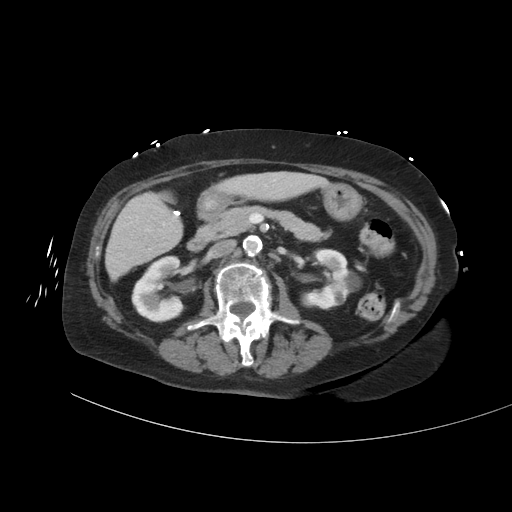
[im 63/86  soft-tissue]
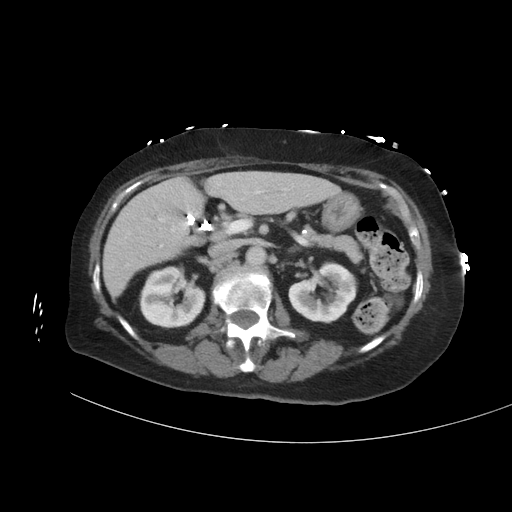
[im 68/86  soft-tissue]
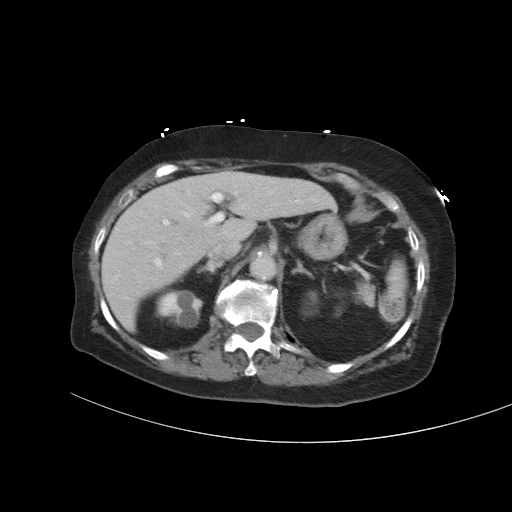
[im 77/86  soft-tissue]
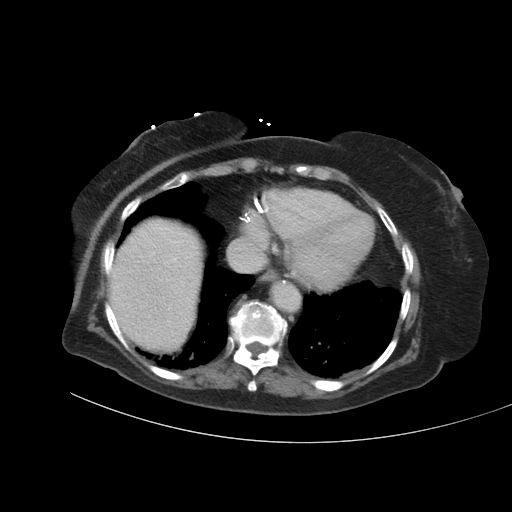
[im 81/86  soft-tissue]
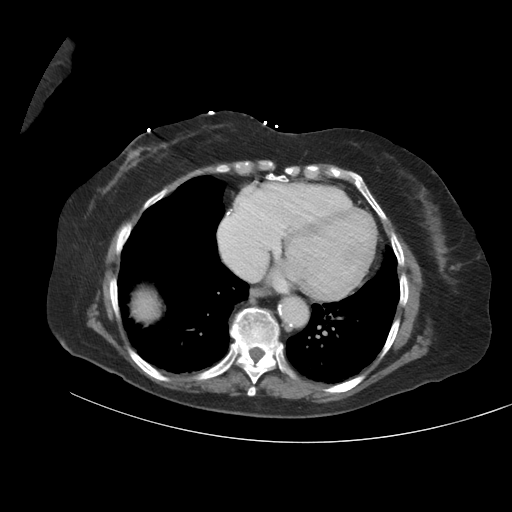

[coronal · coronal · 0.83mm/px · 3 of 86 slices shown]
[im 29/86  soft-tissue]
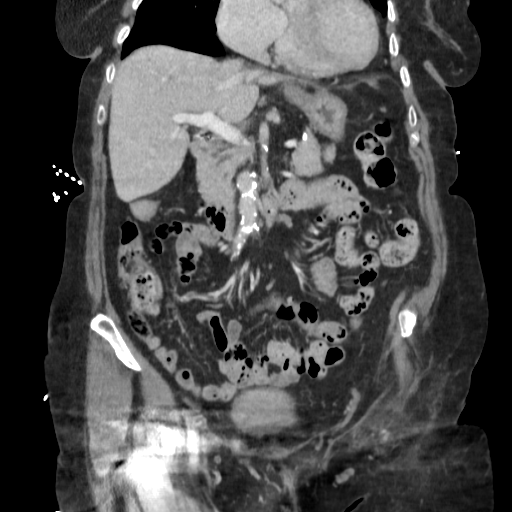
[im 38/86  soft-tissue]
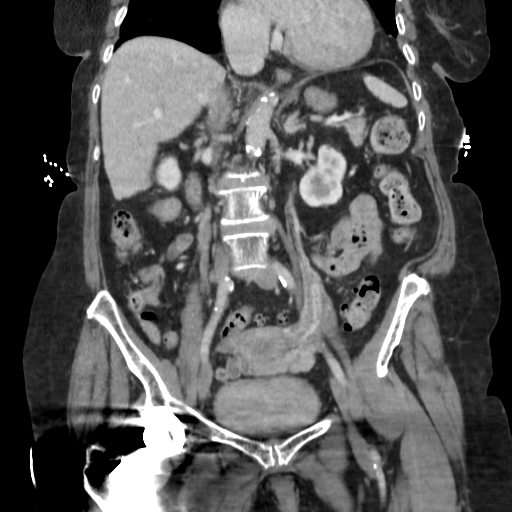
[im 48/86  soft-tissue]
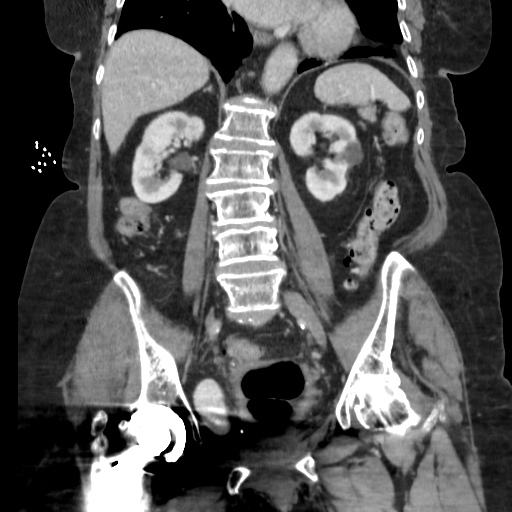

[17 of 46 positions shown; findings below may reference images not displayed]

FINDINGS: The lung bases are clear.  Heart size is upper normal.

Prior cholecystectomy.  Bile ducts are not dilated.  No focal liver
lesion.  Pancreas and spleen are normal.

Bilateral renal cyst.  No renal obstruction or mass.

Atherosclerotic aorta without aneurysm.  Negative for hematoma.  No
free fluid.

Negative for bowel obstruction or bowel thickening.  Normal
appendix.  Negative for mass or adenopathy.
IMPRESSION: No acute abnormality.  Negative for aortic aneurysm or hematoma.

## 2014-08-29 IMAGING — CT CT HEAD W/O CM
1 series · 16 of 30 positions shown, 20 images · non-contrast
Comparison: 01/01/2012

CLINICAL DATA: Recurrent syncope postcatheterization.  On
anticoagulation.

CT HEAD WITHOUT CONTRAST
TECHNIQUE: Contiguous axial images were obtained from the base of
the skull through the vertex without contrast.

[Series 2: head 5.0 h30s · axial · 0.43mm/px · z∈[-138,+7]mm · 16 of 33 slices shown, 20 images]
[im 2/33  brain]
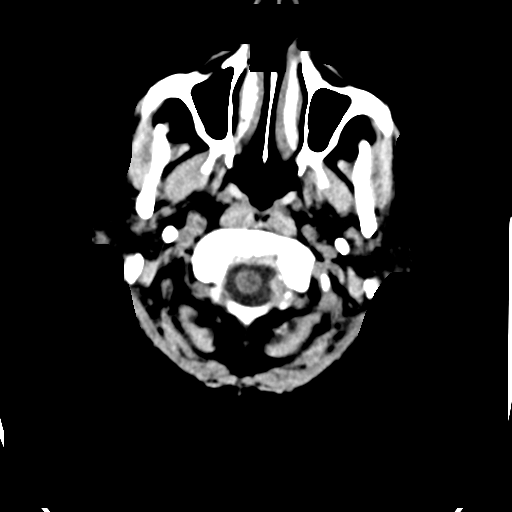
[im 2/33  bone]
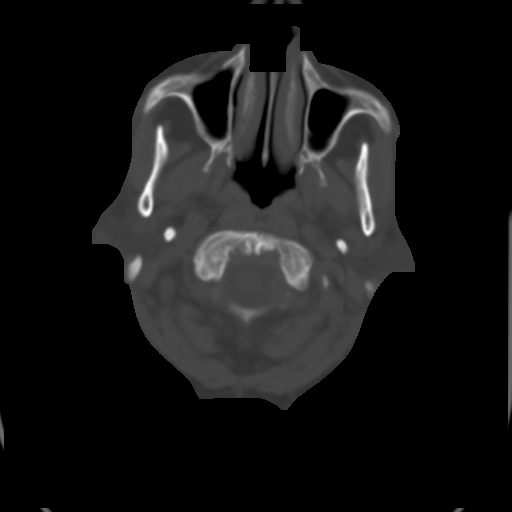
[im 4/33  brain]
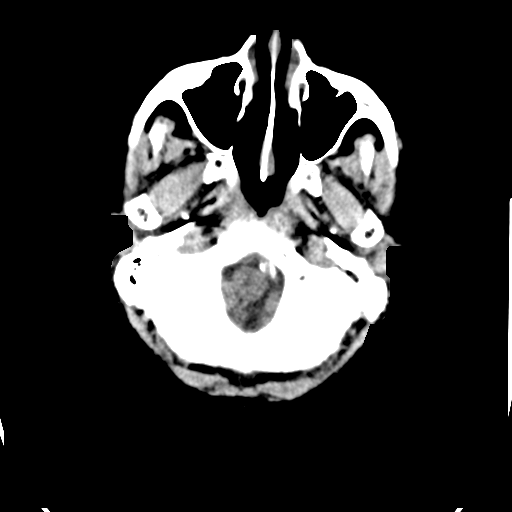
[im 6/33  brain]
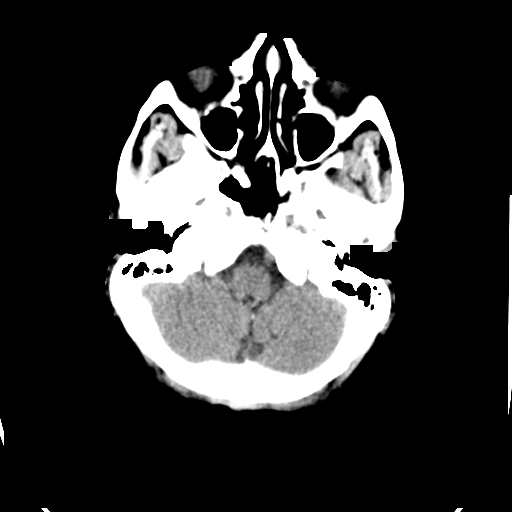
[im 8/33  brain]
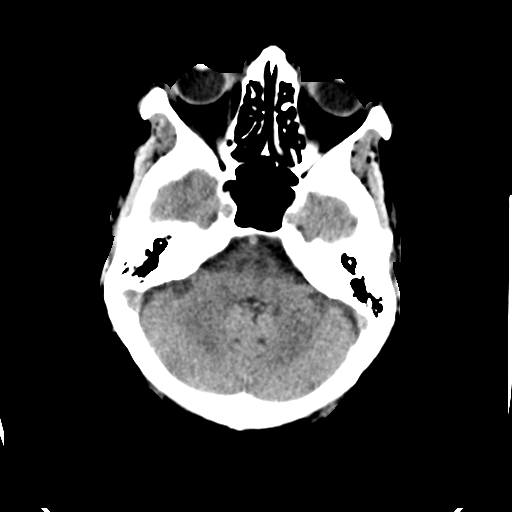
[im 9/33  brain]
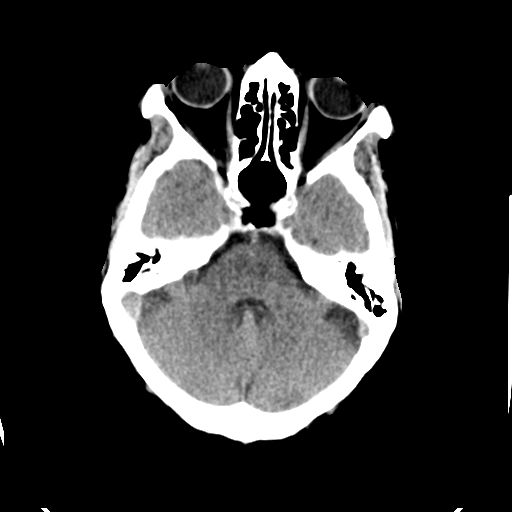
[im 9/33  bone]
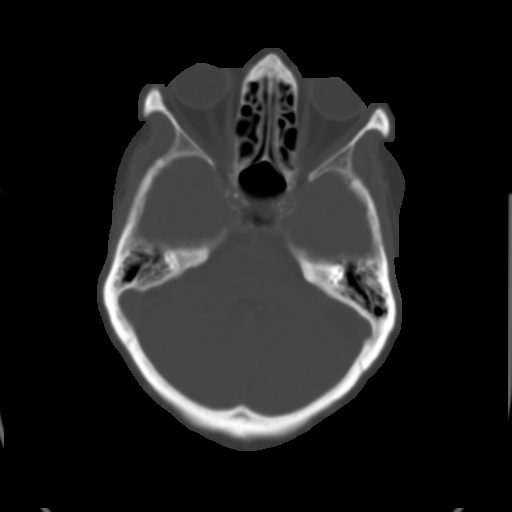
[im 12/33  brain]
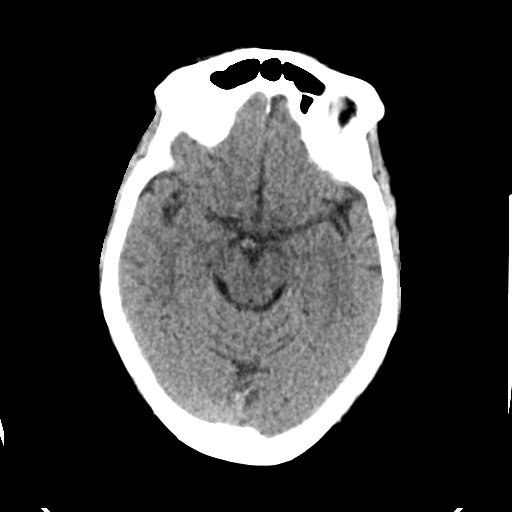
[im 14/33  brain]
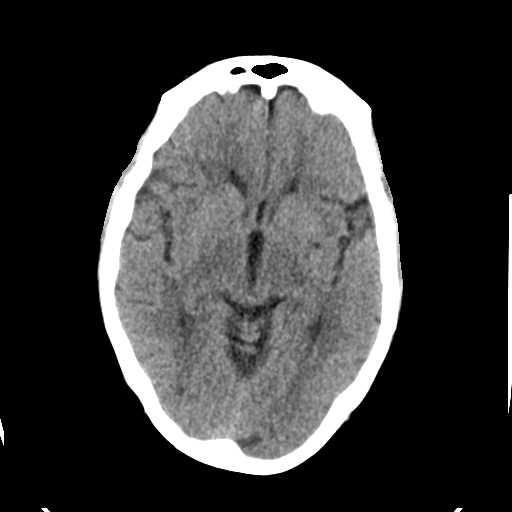
[im 16/33  brain]
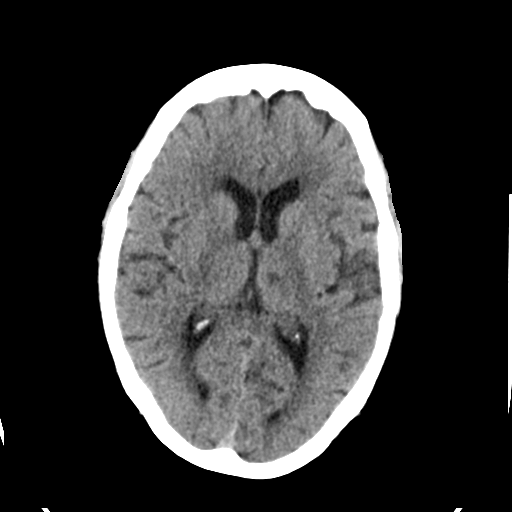
[im 17/33  brain]
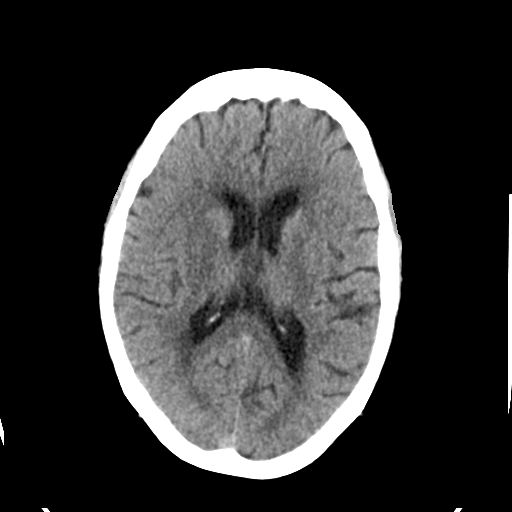
[im 17/33  bone]
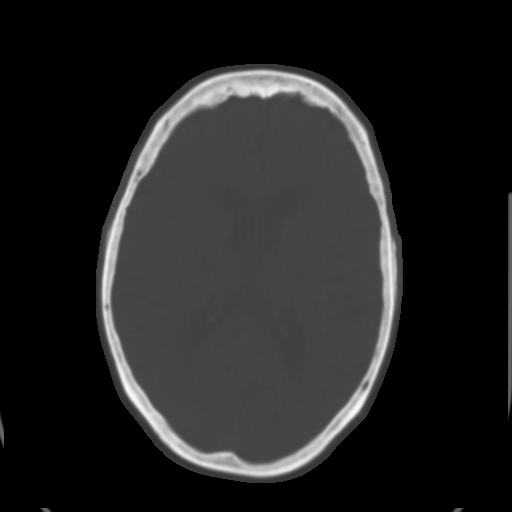
[im 19/33  brain]
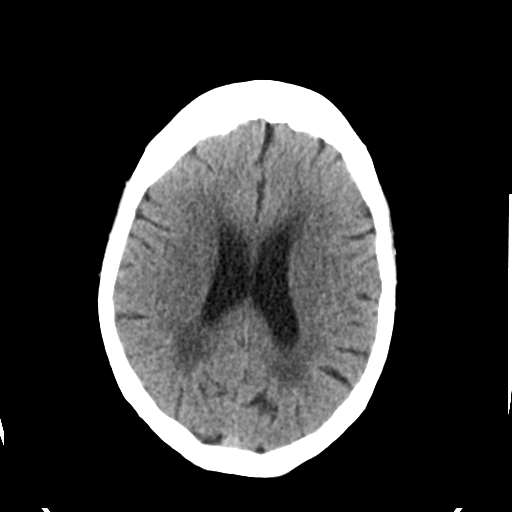
[im 21/33  brain]
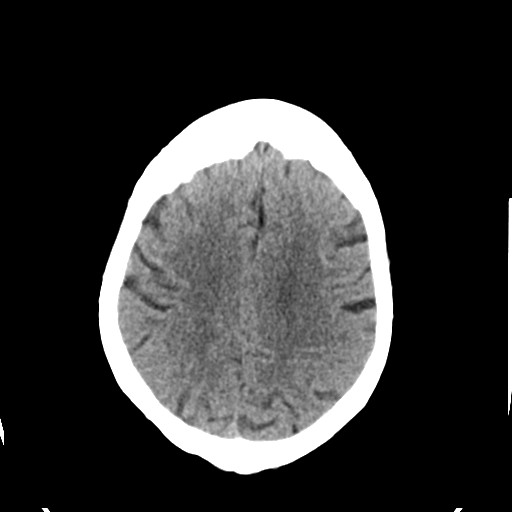
[im 24/33  brain]
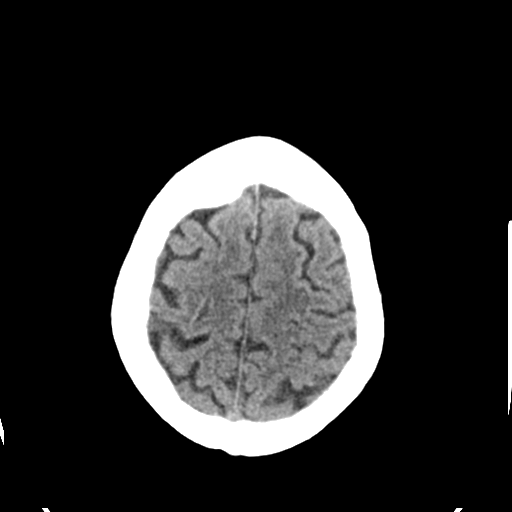
[im 25/33  brain]
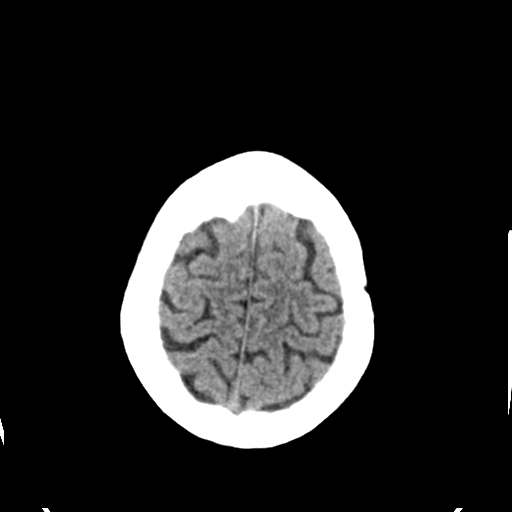
[im 25/33  bone]
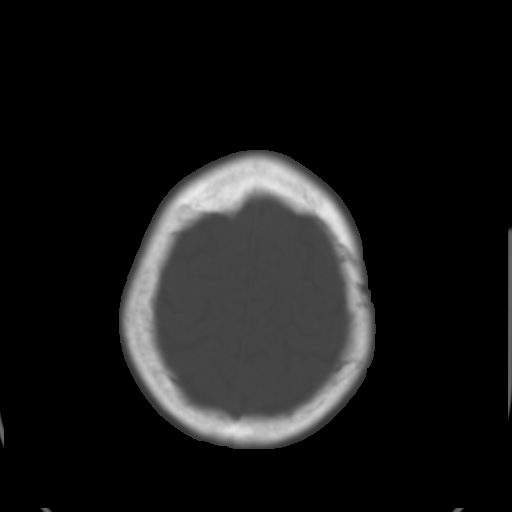
[im 27/33  brain]
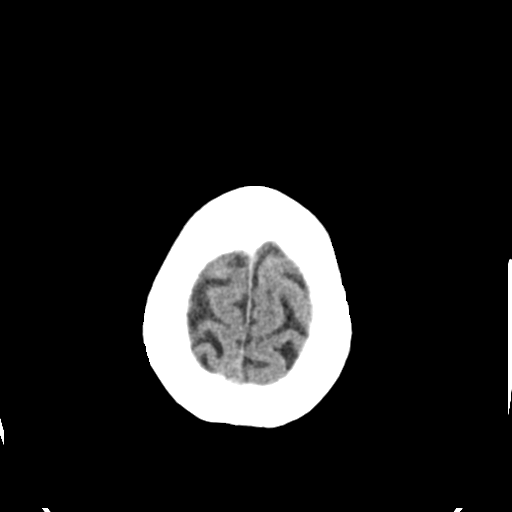
[im 29/33  brain]
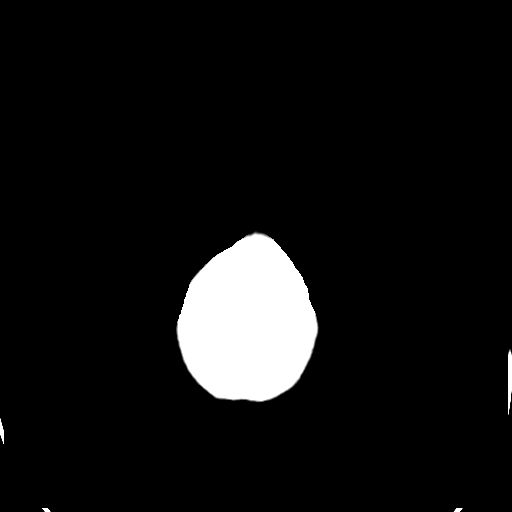
[im 31/33  brain]
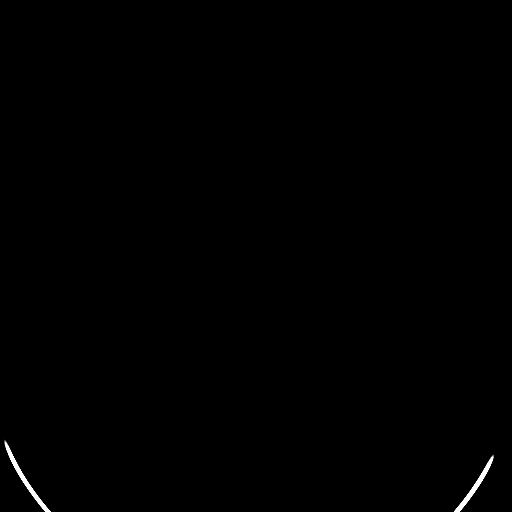

[16 of 30 positions shown; findings below may reference images not displayed]

FINDINGS: Mild cerebral atrophy and small vessel ischemic changes.
The ventricles and sulci are symmetrical without significant
effacement, displacement, or dilatation. No mass effect or midline
shift. No abnormal extra-axial fluid collections. The grey-white
matter junction is distinct. Basal cisterns are not effaced. No
acute intracranial hemorrhage. No depressed skull fractures.
Visualized paranasal sinuses and mastoids air cells are not
significantly opacified.  No significant change since previous
study.
IMPRESSION: No acute intracranial abnormalities.

## 2014-08-30 ENCOUNTER — Encounter: Payer: Self-pay | Admitting: *Deleted

## 2014-09-01 ENCOUNTER — Telehealth: Payer: Self-pay | Admitting: *Deleted

## 2014-09-01 NOTE — Telephone Encounter (Signed)
Pause episode on 08-24-14 at 2:22 a.m. Pt was sleeping. Does not recall episode.

## 2014-09-11 ENCOUNTER — Other Ambulatory Visit: Payer: Self-pay | Admitting: Cardiovascular Disease

## 2014-09-15 ENCOUNTER — Encounter: Payer: Self-pay | Admitting: Cardiovascular Disease

## 2014-09-21 ENCOUNTER — Ambulatory Visit (INDEPENDENT_AMBULATORY_CARE_PROVIDER_SITE_OTHER): Payer: Medicare Other

## 2014-09-21 DIAGNOSIS — R55 Syncope and collapse: Secondary | ICD-10-CM | POA: Diagnosis not present

## 2014-09-28 NOTE — Progress Notes (Signed)
Loop recorder 

## 2014-10-04 ENCOUNTER — Other Ambulatory Visit: Payer: Self-pay | Admitting: Cardiovascular Disease

## 2014-10-07 LAB — CUP PACEART REMOTE DEVICE CHECK: Date Time Interrogation Session: 20160729040500

## 2014-10-13 ENCOUNTER — Encounter: Payer: Self-pay | Admitting: Cardiovascular Disease

## 2014-10-21 ENCOUNTER — Ambulatory Visit (INDEPENDENT_AMBULATORY_CARE_PROVIDER_SITE_OTHER): Payer: Medicare Other | Admitting: *Deleted

## 2014-10-21 DIAGNOSIS — R55 Syncope and collapse: Secondary | ICD-10-CM | POA: Diagnosis not present

## 2014-10-26 NOTE — Progress Notes (Signed)
Loop recorder 

## 2014-11-03 ENCOUNTER — Telehealth: Payer: Self-pay | Admitting: Cardiovascular Disease

## 2014-11-03 ENCOUNTER — Other Ambulatory Visit: Payer: Self-pay | Admitting: Cardiovascular Disease

## 2014-11-03 NOTE — Telephone Encounter (Signed)
Stephanie Atkinson is calling because she has been swelling in both feet and legs and having some shortness of breath . Please call   Thanks

## 2014-11-03 NOTE — Telephone Encounter (Signed)
Patient says past 2-3 months has had swelling in feet and legs resolved with elevation.  Also c/o feeling short of breath with housework resolved by rest.  Has not taken Lasix since 2014.  Called primary care doctor and just got Lasix restarted and will be picking up today.  Does not know if Dr. Has done any labs to watch her kidney function.  Instructed patient to observe symptoms over the next few days.  If she gets worse she needs to call right away.  If this is helping her feels better let us know.

## 2014-11-03 NOTE — Telephone Encounter (Signed)
Needs OV with MLP 

## 2014-11-04 ENCOUNTER — Encounter: Payer: Self-pay | Admitting: Cardiology

## 2014-11-04 NOTE — Telephone Encounter (Signed)
Called patient LVM that she has an appointment with Cassie Freer on Wednesday September 14th at 0800 in the morning at [address] location

## 2014-11-04 NOTE — Telephone Encounter (Signed)
Called patient to ask her if there is anytime during the next week that she can't come in.  LVMTCB  Routed to scheduling to follow up with midlevel (NP or PA) within the next week

## 2014-11-07 ENCOUNTER — Encounter: Payer: Self-pay | Admitting: Cardiovascular Disease

## 2014-11-09 ENCOUNTER — Ambulatory Visit (INDEPENDENT_AMBULATORY_CARE_PROVIDER_SITE_OTHER): Payer: Medicare Other | Admitting: Physician Assistant

## 2014-11-09 ENCOUNTER — Encounter: Payer: Self-pay | Admitting: Physician Assistant

## 2014-11-09 VITALS — BP 166/84 | HR 56 | Ht 63.0 in | Wt 160.6 lb

## 2014-11-09 DIAGNOSIS — I10 Essential (primary) hypertension: Secondary | ICD-10-CM | POA: Diagnosis not present

## 2014-11-09 DIAGNOSIS — R609 Edema, unspecified: Secondary | ICD-10-CM

## 2014-11-09 DIAGNOSIS — R55 Syncope and collapse: Secondary | ICD-10-CM | POA: Diagnosis not present

## 2014-11-09 DIAGNOSIS — R001 Bradycardia, unspecified: Secondary | ICD-10-CM | POA: Diagnosis not present

## 2014-11-09 MED ORDER — FUROSEMIDE 40 MG PO TABS: 40.0000 mg | ORAL_TABLET | Freq: Every day | ORAL | Status: DC

## 2014-11-09 NOTE — Patient Instructions (Addendum)
Medication Instructions:  Your physician has recommended you make the following change in your medication:  1.  Increase your Lasix to taking 40 mg 1 tablet every day.   Labwork: BMET   11-30-14 @ 9:45   Testing/Procedures: None ordered  Follow-Up: Your physician recommends that you keep your follow-up appointment with Dr. Allyson Sabal 11-30-14 @ 10:15 @ the [facility]   Any Other Special Instructions Will Be Listed Below (If Applicable). 1.  The 1st thing every morning, we want you to go to the restroom, weigh yourself and record the weight in a notebook. 2.  Eat bananas on a regular basis 3.  Please call our office is you start to have cramps or get light headed.

## 2014-11-09 NOTE — Progress Notes (Signed)
Cardiology Office Note   Date:  11/09/2014   ID:  Stephanie TURRUBIATES, DOB 03-24-1924, MRN [id number]  PCP:  Thayer Headings, MD  Cardiologist:  Dr Allyson Sabal,  Dr Croitoru (Loop) Barrett, [place]orn [place], [place]   Chief complaint: Fatigue and dyspnea on exertion, lower extremity edema  History of Present Illness: Stephanie Atkinson is a 81 y.o. female with a history of CABG '06, DES PDA'12, PR > 4, PAF on Eliquis, Syncope>loop, hypothryoid, HTN, HL, CKD III.    Stephanie Atkinson presents for evaluation of lower extremity edema and fatigue.  Stephanie Atkinson began noticing increasing fatigue and some dyspnea on exertion and couple months ago. She is able to do most of the activities she was previously doing but has to take more frequent rest breaks.  Recently, she began noticing lower extremity edema. She has been taking the Lasix almost daily, but still gets it. There is some in the mornings, but mostly during the day. She denies orthopnea or PND. When she cooks at home, she does well with a low sodium diet. However, when she eats out her sodium intake is considerably higher.  She was concerned about the swelling and came in today for evaluation.  She is aware her blood pressure is up today, but has not taken her medications yet and had [place] food last night. She she promises to throw the leftovers away. She has not been weighing daily. Her current Lasix dose is 40 mg daily when necessary but she has taken it 4 out of the last 5 days.  Past Medical History  Diagnosis Date  . Hypertension   . Hx of CABG 2006    LIMA-LAD, free RIMA-PDA, Radial-OM1-OM2  . MI, old        . Hyperlipidemia   . Presence of stent in right coronary artery 07/12    RIMA-PDA occluded, DES stent placed  . PAF (paroxysmal atrial fibrillation)     was on amio, d/c'd due to prolonged PR interval, rate control  . Syncope 07/12    bradycardic, beta blocker decreased, also felt to be dehydrated  . Chronic kidney disease, stage 3   .  Hypothyroid   . Coronary artery disease     Past Surgical History  Procedure Laterality Date  . Carotid stent      2012  . Cardiac catheterization  409811    dr. Onalee Hua harding, revealing a atretic bypass to her right with patent distal RCA stent  . Doppler echocardiography  [id number]    mild asymmetric left ventricular hypertrophy, left ventricular systolic function is low normal, ejection fraction = 50-55%, the LA is moderate dilated, the RA is mildly dilated, no significant valvular disease  . Nm myoview ltd  [id number]    post stress left ventricle is normal in size, post stress ejection fraction is 67% global left ventricular systolic function is normal, normal myocardial perfusion study, abnormal myocardial perfusion study, low risk scan  . Left heart catheterization with coronary/graft angiogram N/A 10/12/2012    Procedure: LEFT HEART CATHETERIZATION WITH Isabel Caprice;  Surgeon: Marykay Lex, MD;  Location: [facility] CATH LAB;  Service: Cardiovascular;  Laterality: N/A;  . Loop recorder implant N/A 10/15/2012    Procedure: LOOP RECORDER IMPLANT;  Surgeon: Thurmon Fair, MD;  Location: [facility] CATH LAB;  Service: Cardiovascular;  Laterality: N/A;  . Coronary artery bypass graft  2006    LIMA-LAD, free RIMA-PDA, Radial-OM1-OM2    Current Outpatient Prescriptions  Medication Sig Dispense Refill  . acetaminophen (  TYLENOL) 325 MG tablet Take 650 mg by mouth every 4 (four) hours as needed. For pain    . alum & mag hydroxide-simeth (MAALOX/MYLANTA) 200-200-20 MG/5ML suspension Take 15 mLs by mouth every 2 (two) hours as needed.    [name] amLODipine (NORVASC) 5 MG tablet Take 5 mg by mouth daily.    [name] apixaban (ELIQUIS) 2.5 MG TABS tablet Take 1 tablet (2.5 mg total) by mouth 2 (two) times daily. 42 tablet 0  . aspirin EC 81 MG EC tablet Take 1 tablet (81 mg total) by mouth daily.    [name] atorvastatin (LIPITOR) 80 MG tablet Take 80 mg by mouth daily.    . furosemide (LASIX) 40 MG tablet Take 1 tablet  (40 mg total) by mouth daily. 90 tablet 3  . levothyroxine (SYNTHROID, LEVOTHROID) 50 MCG tablet Take 50 mcg by mouth daily.    . nitroGLYCERIN (NITROSTAT) 0.4 MG SL tablet Place 0.4 mg under the tongue every 5 (five) minutes as needed. For chest pain    . traMADol (ULTRAM) 50 MG tablet Take 50 mg by mouth every 6 (six) hours as needed for pain.    . valsartan (DIOVAN) 320 MG tablet TAKE 1 TABLET BY MOUTH EVERY DAY 30 tablet 2   No current facility-administered medications for this visit.    Allergies:   Penicillins and Sulfa antibiotics    Social History:  The patient  reports that she has never smoked. Her smokeless tobacco use includes Snuff. She reports that she does not drink alcohol or use illicit drugs.   Family History:  The patient's family history includes CAD in her maternal grandmother and mother.   ROS:  Please see the history of present illness. All other systems are reviewed and negative.    PHYSICAL EXAM: VS:  BP 166/84 mmHg  Pulse 56  Ht 5\' 3"  (1.6 m)  Wt 160 lb 9.6 oz (72.848 kg)  BMI 28.46 kg/m2  SpO2 95% , BMI Body mass index is 28.46 kg/(m^2). GEN: Well nourished, well developed, in no acute distress HEENT: normal Neck:  JVD 9 cm, +hepatojugular reflux, no carotid bruits, or masses Cardiac: RRR; no murmurs, rubs, or gallops, trace edema  Respiratory:  Decreased BS bases bilaterally, normal work of breathing GI: soft, nontender, nondistended, + BS MS: no deformity or atrophy; distal pulses intact in all 4 extremities Skin: warm and dry, no rash Neuro:  Strength and sensation are intact Psych: euthymic mood, full affect   EKG:  EKG is not ordered today. Loop Recorder: 10/26/2014 Ms. Mcquilkin is in atrial fibrillation 42% of the time for the month of August. Since the loop recorder was inserted, she has been in atrial fibrillation 17.5% of the time. Her percentage of time in atrial fibrillation is increasing.   Her rate is generally well-controlled, there are  occasional episodes where she is less than 40, but most of the time she is between 40 and 80.  When in sinus rhythm, she has actually more time with her heart rate less than 40 that she does when she is in atrial fibrillation.  Recent Labs:  BMET 07/2014 by primary M.D. Showed potassium 4.5, BUN 21 and creatinine 1.1  Lipid Panel    Component Value Date/Time   CHOL 147 12/11/2010 0901   TRIG 84 12/11/2010 0901   HDL 56 12/11/2010 0901   CHOLHDL 2.6 12/11/2010 0901   VLDL 17 12/11/2010 0901   LDLCALC 74 12/11/2010 0901     Wt Readings from Last 3  Encounters:  11/09/14 160 lb 9.6 oz (72.848 kg)  07/18/14 161 lb 3.2 oz (73.12 kg)  02/01/14 162 lb 11.2 oz (73.8 kg)     Other studies Reviewed: Additional studies/ records that were reviewed today include: Previous office visits, hospital records and op notes, Loop recorder report.  ASSESSMENT AND PLAN:  1.  Edema: Ms Blass  EF was last assessed in 2014, at cath. Her EF was 65-70 percent with hyperdynamic wall motion. With her history of high blood pressure, she is at high risk for diastolic dysfunction.  Plan: We will increase her Lasix to 40 mg daily. She is to keep her follow-up appointment with Dr. Allyson Sabal in October and get a BMET done that day. He can discuss with her if she needs an echocardiogram performed. For now, we will treat her symptomatically.  She understands that she is to get up every morning, go to the bathroom and then weigh herself. She is to record these weights and let us know if her weight increases. She is drinking approximately 5 of the 8 ounce bottles of water a day and she was advised that this is okay. She is to let us know if she begins to get lightheaded or the edema does not improve.  2. Syncope: She was asking about the loop recorder and if it would stay in there. It is still operational and providing helpful information.  Current medicines are reviewed at length with the patient today.  The patient does  not have concerns regarding medicines.  The following changes have been made:  Change Lasix from 40 mg daily when necessary to 40 mg daily  Labs/ tests ordered today include:   Orders Placed This Encounter  Procedures  . Basic Metabolic Panel (BMET)     Disposition:   FU with Dr. Allyson Sabal as scheduled.  Tawny Asal  11/09/2014 8:27 AM    [facility] [address] [place], [place], [place]  [address] Phone: [phone]; Fax: [phone]

## 2014-11-21 ENCOUNTER — Ambulatory Visit (INDEPENDENT_AMBULATORY_CARE_PROVIDER_SITE_OTHER): Payer: Medicare Other | Admitting: *Deleted

## 2014-11-21 DIAGNOSIS — R55 Syncope and collapse: Secondary | ICD-10-CM

## 2014-11-21 LAB — CUP PACEART REMOTE DEVICE CHECK: Date Time Interrogation Session: 20160925143530

## 2014-11-24 ENCOUNTER — Encounter: Payer: Self-pay | Admitting: Cardiology

## 2014-11-24 LAB — CUP PACEART REMOTE DEVICE CHECK: Date Time Interrogation Session: 20160826140610

## 2014-11-24 NOTE — Progress Notes (Signed)
Loop recorder 

## 2014-11-24 NOTE — Progress Notes (Signed)
Carelink summary report received. Battery status RRT since 09/22/14 (likely false, ROV with [facility] on 12/01/14 at 10:00am to wand for software update). Normal device function. No new symptom, tachy, brady, or pause episodes. 477 AF episodes (burden 42.0%), +Eliquis, V rates well controlled. Monthly summary reports and ROV with [facility] on 02/07/15 at 10:15am.

## 2014-11-29 ENCOUNTER — Encounter: Payer: Self-pay | Admitting: Cardiovascular Disease

## 2014-11-30 ENCOUNTER — Ambulatory Visit: Payer: Medicare Other | Admitting: Cardiovascular Disease

## 2014-12-01 ENCOUNTER — Encounter: Payer: Self-pay | Admitting: Cardiology

## 2014-12-01 ENCOUNTER — Ambulatory Visit (INDEPENDENT_AMBULATORY_CARE_PROVIDER_SITE_OTHER): Payer: Medicare Other | Admitting: *Deleted

## 2014-12-01 DIAGNOSIS — I480 Paroxysmal atrial fibrillation: Secondary | ICD-10-CM

## 2014-12-01 LAB — CUP PACEART INCLINIC DEVICE CHECK: Date Time Interrogation Session: 20161006110538

## 2014-12-01 NOTE — Progress Notes (Signed)
False EOS. Updated software shows battery status now "Good." Pt with 0 tachy episodes; 0 brady episodes; 1 asystole---previously noted, pt was asleep. 38.6% AF + eliquis. ROV w/ [facility] 02/07/15.

## 2014-12-09 DIAGNOSIS — M25561 Pain in right knee: Secondary | ICD-10-CM | POA: Diagnosis not present

## 2014-12-09 DIAGNOSIS — M25551 Pain in right hip: Secondary | ICD-10-CM | POA: Diagnosis not present

## 2014-12-09 DIAGNOSIS — M25552 Pain in left hip: Secondary | ICD-10-CM | POA: Diagnosis not present

## 2014-12-09 DIAGNOSIS — M25511 Pain in right shoulder: Secondary | ICD-10-CM | POA: Diagnosis not present

## 2014-12-12 ENCOUNTER — Encounter: Payer: Self-pay | Admitting: Cardiovascular Disease

## 2014-12-19 NOTE — Progress Notes (Signed)
Carelink summary report received. Battery status OK. Normal device function. No new symptom episodes, tachy episodes, brady, or pause episodes. 267 AF episodes (burden 31.9%), +Eliquis, avg V rate well controlled. Monthly summary reports and ROV with [facility] on 02/07/15 at 10:15am. [facility]

## 2014-12-20 ENCOUNTER — Ambulatory Visit (INDEPENDENT_AMBULATORY_CARE_PROVIDER_SITE_OTHER): Payer: Medicare Other | Admitting: *Deleted

## 2014-12-20 DIAGNOSIS — R55 Syncope and collapse: Secondary | ICD-10-CM

## 2014-12-20 LAB — CUP PACEART REMOTE DEVICE CHECK: Date Time Interrogation Session: 20161025143649

## 2014-12-21 NOTE — Progress Notes (Signed)
Loop recorder 

## 2014-12-28 ENCOUNTER — Ambulatory Visit (INDEPENDENT_AMBULATORY_CARE_PROVIDER_SITE_OTHER): Payer: Medicare Other | Admitting: Cardiovascular Disease

## 2014-12-28 ENCOUNTER — Encounter: Payer: Self-pay | Admitting: Cardiovascular Disease

## 2014-12-28 VITALS — BP 148/58 | HR 54 | Ht 62.0 in | Wt 161.0 lb

## 2014-12-28 DIAGNOSIS — Z23 Encounter for immunization: Secondary | ICD-10-CM

## 2014-12-28 DIAGNOSIS — E785 Hyperlipidemia, unspecified: Secondary | ICD-10-CM | POA: Diagnosis not present

## 2014-12-28 DIAGNOSIS — Z951 Presence of aortocoronary bypass graft: Secondary | ICD-10-CM

## 2014-12-28 DIAGNOSIS — R55 Syncope and collapse: Secondary | ICD-10-CM | POA: Diagnosis not present

## 2014-12-28 DIAGNOSIS — I480 Paroxysmal atrial fibrillation: Secondary | ICD-10-CM

## 2014-12-28 DIAGNOSIS — I10 Essential (primary) hypertension: Secondary | ICD-10-CM

## 2014-12-28 NOTE — Assessment & Plan Note (Signed)
History of chronic atrial fibrillation currently with a ventricular response of 54. She is on Eliquis oral anticoagulation.

## 2014-12-28 NOTE — Assessment & Plan Note (Signed)
History of hyperlipidemia on atorvastatin 80. We will recheck a lipid and liver profile

## 2014-12-28 NOTE — Assessment & Plan Note (Signed)
History of coronary artery disease status post coronary artery bypass grafting March 2006 with 4 grafts placed at that time. She had a DES placed to her PDA in July 2012. Follow-up cath October 2012 as recently as 10/12/12 showed distal disease which was treated medically. She denies chest pain or shortness of breath.

## 2014-12-28 NOTE — Patient Instructions (Signed)
Medication Instructions:  Your physician recommends that you continue on your current medications as directed. Please refer to the Current Medication list given to you today.   Labwork: Your physician recommends that you return for lab work in: FASTING (LIPID/LIVER) The lab can be found on the FIRST FLOOR of out building in [address]   Testing/Procedures: none  Follow-Up: We request that you follow-up in: 6 MONTHS WITH [name] and in 12 MONTHS with Dr [name]  You will receive a reminder letter in the mail two months in advance. If you don't receive a letter, please call our office to schedule the follow-up appointment.    Any Other Special Instructions Will Be Listed Below (If Applicable).     If you need a refill on your cardiac medications before your next appointment, please call your pharmacy.

## 2014-12-28 NOTE — Assessment & Plan Note (Signed)
History of syncope with subsequent loop recorder placed by Dr. Royann Shiversroitoru which did not show any tachycardia or bradycardia arrhythmias contributing to this.

## 2014-12-28 NOTE — Progress Notes (Signed)
12/28/2014 Stephanie Atkinson   1924/07/15  [id number]  Primary Physician Thayer Headings, MD Primary Cardiologist: Runell Gess MD [name]   HPI:   81 year old female with a history of CAD who I last saw in the office one year ago.. She had CABG X 4 in 3/06. She had a DES placed to her PDA in July 2012, this was patent on follow up cath in Oct 2012 and recently 10/12/12. She did have distal disease that is to be treated medically. She h in the office 07/10/12 and was noted to have a long 1st degree AVB on her EKG, her PR was > 4. She was asymptomatic. She has a history of PAF since her CABG in 2006 and had been on Toprol, Amiodarone, ASA, and Plavix. At that time we stopped her Toprol and decreased her Amiodarone. When she was seen in follow up 07/29/12 her PR remained prolonged her Amiodarone was discontinued. On 10/06/12 she was seen and noted to be in AF with CVR. She was placed on Eloquis and her Plavix was Stopped. She the was admitted a few days later with chest pain with subtle EKG changes. She was cathed 10/12/12 and her anatomy was unchanged. She was discharge but readmitted later the 19th after a syncopal spell. She had a loop recorder implanted 10/14/12. She was noted to be mildly dehydrated and this, as well as new nitrate therapy, may have contributed to her syncope. . She has had no angina or syncope. Interrogation of her loop recorder was unrevealing.   Current Outpatient Prescriptions  Medication Sig Dispense Refill  . acetaminophen (TYLENOL) 325 MG tablet Take 650 mg by mouth every 4 (four) hours as needed. For pain    . alum & mag hydroxide-simeth (MAALOX/MYLANTA) 200-200-20 MG/5ML suspension Take 15 mLs by mouth every 2 (two) hours as needed.    [name] amLODipine (NORVASC) 5 MG tablet Take 5 mg by mouth daily.    [name] apixaban (ELIQUIS) 2.5 MG TABS tablet Take 1 tablet (2.5 mg total) by mouth 2 (two) times daily. 42 tablet 0  . aspirin EC 81 MG EC tablet Take 1 tablet (81  mg total) by mouth daily.    [name] atorvastatin (LIPITOR) 80 MG tablet Take 80 mg by mouth daily.    . furosemide (LASIX) 40 MG tablet Take 1 tablet (40 mg total) by mouth daily. 90 tablet 3  . levothyroxine (SYNTHROID, LEVOTHROID) 50 MCG tablet Take 50 mcg by mouth daily.    . nitroGLYCERIN (NITROSTAT) 0.4 MG SL tablet Place 0.4 mg under the tongue every 5 (five) minutes as needed. For chest pain    . traMADol (ULTRAM) 50 MG tablet Take 50 mg by mouth every 6 (six) hours as needed for pain.    . valsartan (DIOVAN) 320 MG tablet TAKE 1 TABLET BY MOUTH EVERY DAY 30 tablet 2   No current facility-administered medications for this visit.    Allergies  Allergen Reactions  . Penicillins Hives  . Sulfa Antibiotics Swelling    Social History   Social History  . Marital Status: Single    Spouse Name: N/A  . Number of Children: N/A  . Years of Education: N/A   Occupational History  . Not on file.   Social History Main Topics  . Smoking status: Never Smoker   . Smokeless tobacco: Current User    Types: Snuff  . Alcohol Use: No  . Drug Use: No  . Sexual Activity: Not on  file   Other Topics Concern  . Not on file   Social History Narrative     Review of Systems: General: negative for chills, fever, night sweats or weight changes.  Cardiovascular: negative for chest pain, dyspnea on exertion, edema, orthopnea, palpitations, paroxysmal nocturnal dyspnea or shortness of breath Dermatological: negative for rash Respiratory: negative for cough or wheezing Urologic: negative for hematuria Abdominal: negative for nausea, vomiting, diarrhea, bright red blood per rectum, melena, or hematemesis Neurologic: negative for visual changes, syncope, or dizziness All other systems reviewed and are otherwise negative except as noted above.    Blood pressure 148/58, pulse 54, height 5\' 2"  (1.575 m), weight 161 lb (73.029 kg).  General appearance: alert and no distress Neck: no adenopathy, no  carotid bruit, no JVD, supple, symmetrical, trachea midline and thyroid not enlarged, symmetric, no tenderness/mass/nodules Lungs: clear to auscultation bilaterally Heart: irregularly irregular rhythm Extremities: extremities normal, atraumatic, no cyanosis or edema  EKG atrial fibrillation with a ventricular response of 54 and occasional apparent beats with evidence of left surgical hypertrophy and nonspecific ST and T-wave changes. I personally reviewed this EKG  ASSESSMENT AND PLAN:   Syncope and collapse History of syncope with subsequent loop recorder placed by Dr. Royann Shiversroitoru which did not show any tachycardia or bradycardia arrhythmias contributing to this.  Hx of CABG X 4 3/06-  History of coronary artery disease status post coronary artery bypass grafting March 2006 with 4 grafts placed at that time. She had a DES placed to her PDA in July 2012. Follow-up cath October 2012 as recently as 10/12/12 showed distal disease which was treated medically. She denies chest pain or shortness of breath.  Dyslipidemia History of hyperlipidemia on atorvastatin 80. We will recheck a lipid and liver profile  PAF history History of chronic atrial fibrillation currently with a ventricular response of 54. She is on Eliquis oral anticoagulation.      Runell GessJonathan J. Liticia Gasior MD FACP,FACC,FAHA, [facility] 12/28/2014 3:17 PM

## 2015-01-02 ENCOUNTER — Encounter: Payer: Self-pay | Admitting: Cardiovascular Disease

## 2015-01-03 ENCOUNTER — Encounter: Payer: Self-pay | Admitting: Cardiovascular Disease

## 2015-01-05 DIAGNOSIS — E785 Hyperlipidemia, unspecified: Secondary | ICD-10-CM | POA: Diagnosis not present

## 2015-01-06 LAB — HEPATIC FUNCTION PANEL
ALK PHOS: 94 U/L (ref 33–130)
ALT: 32 U/L — ABNORMAL HIGH (ref 6–29)
AST: 31 U/L (ref 10–35)
Albumin: 4 g/dL (ref 3.6–5.1)
BILIRUBIN INDIRECT: 0.8 mg/dL (ref 0.2–1.2)
Bilirubin, Direct: 0.2 mg/dL (ref <=0.2)
TOTAL PROTEIN: 6.4 g/dL (ref 6.1–8.1)
Total Bilirubin: 1 mg/dL (ref 0.2–1.2)

## 2015-01-06 LAB — LIPID PANEL
Cholesterol: 121 mg/dL — ABNORMAL LOW (ref 125–200)
HDL: 47 mg/dL
LDL Cholesterol: 56 mg/dL
Total CHOL/HDL Ratio: 2.6 ratio
Triglycerides: 89 mg/dL
VLDL: 18 mg/dL

## 2015-01-12 ENCOUNTER — Telehealth: Payer: Self-pay | Admitting: Cardiovascular Disease

## 2015-01-12 NOTE — Telephone Encounter (Signed)
Pt aware of results 

## 2015-01-12 NOTE — Telephone Encounter (Signed)
Pt is returning Kim's call in reference to some lab results. Please f/u with her   Thanks

## 2015-01-20 NOTE — Progress Notes (Signed)
Carelink summary report received. Battery status OK. Normal device function. No new symptom, tachy, or brady episodes. 2 pause episodes, 1 pause appropriate, ~3sec, 1 pause inappropriate, undersensed PVC. 573 AF episodes (burden 39.3%), +Eliquis, avg V rates well controlled. Monthly summary reports and ROV with [facility] on 02/13/15 at 9:45am.

## 2015-01-23 ENCOUNTER — Ambulatory Visit (INDEPENDENT_AMBULATORY_CARE_PROVIDER_SITE_OTHER): Payer: Medicare Other | Admitting: *Deleted

## 2015-01-23 DIAGNOSIS — R55 Syncope and collapse: Secondary | ICD-10-CM

## 2015-01-23 LAB — CUP PACEART REMOTE DEVICE CHECK: MDC IDC SESS DTM: 20161124150605

## 2015-01-25 NOTE — Progress Notes (Signed)
LOOP RECORDER  

## 2015-01-26 ENCOUNTER — Encounter: Payer: Self-pay | Admitting: Cardiovascular Disease

## 2015-01-31 ENCOUNTER — Other Ambulatory Visit: Payer: Self-pay | Admitting: Cardiovascular Disease

## 2015-01-31 NOTE — Telephone Encounter (Signed)
Rx(s) sent to pharmacy electronically.  

## 2015-02-07 ENCOUNTER — Encounter: Payer: Medicare Other | Admitting: Cardiovascular Disease

## 2015-02-08 ENCOUNTER — Encounter: Payer: Medicare Other | Admitting: Cardiovascular Disease

## 2015-02-13 ENCOUNTER — Encounter: Payer: Self-pay | Admitting: Cardiovascular Disease

## 2015-02-13 ENCOUNTER — Ambulatory Visit (INDEPENDENT_AMBULATORY_CARE_PROVIDER_SITE_OTHER): Payer: Medicare Other | Admitting: Cardiovascular Disease

## 2015-02-13 VITALS — BP 138/66 | HR 56 | Resp 16 | Ht 63.0 in | Wt 162.5 lb

## 2015-02-13 DIAGNOSIS — Z951 Presence of aortocoronary bypass graft: Secondary | ICD-10-CM

## 2015-02-13 DIAGNOSIS — I25708 Atherosclerosis of coronary artery bypass graft(s), unspecified, with other forms of angina pectoris: Secondary | ICD-10-CM | POA: Diagnosis not present

## 2015-02-13 DIAGNOSIS — I10 Essential (primary) hypertension: Secondary | ICD-10-CM | POA: Diagnosis not present

## 2015-02-13 DIAGNOSIS — I480 Paroxysmal atrial fibrillation: Secondary | ICD-10-CM | POA: Diagnosis not present

## 2015-02-13 DIAGNOSIS — R55 Syncope and collapse: Secondary | ICD-10-CM

## 2015-02-13 DIAGNOSIS — Z7901 Long term (current) use of anticoagulants: Secondary | ICD-10-CM

## 2015-02-13 DIAGNOSIS — I25810 Atherosclerosis of coronary artery bypass graft(s) without angina pectoris: Secondary | ICD-10-CM | POA: Insufficient documentation

## 2015-02-13 LAB — CUP PACEART INCLINIC DEVICE CHECK: Date Time Interrogation Session: 20161219151344

## 2015-02-13 NOTE — Progress Notes (Signed)
Patient ID: Stephanie Atkinson, female   DOB: 12/05/1924, 81 y.o.   MRN: [id number]    Cardiology Office Note    Date:  02/13/2015   ID:  Stephanie Atkinson, DOB 04/14/1924, MRN [id number]  PCP:  Thayer Headings, MD  Cardiologist:  Nanetta Batty, MD  Thurmon Fair, MD   Chief Complaint  Patient presents with  . Follow-up    no chest pain, occassional shortness of breath, has edema, no pain or cramping in legs, no lightheaded or dizziness     History of Present Illness:  Stephanie Atkinson is a 81 y.o. female with CAD s/p CABG, PAFib, implantable loop recorder (for syncope that has not recurred since implant).  She feels well. The patient specifically denies any chest pain at rest exertion, dyspnea at rest or with exertion, orthopnea, paroxysmal nocturnal dyspnea, syncope, palpitations, focal neurological deficits, intermittent claudication, lower extremity edema, unexplained weight gain, cough, hemoptysis or wheezing.  Device checks show frequent PAF that seems to be asymtomatic. Excellent ventricular rate control. Longest episode recently 6 hours in October. No stroke symptoms, no bleeding.   Past Medical History  Diagnosis Date  . Hypertension   . Hx of CABG 2006    LIMA-LAD, free RIMA-PDA, Radial-OM1-OM2  . MI, old        . Hyperlipidemia   . Presence of stent in right coronary artery 07/12    RIMA-PDA occluded, DES stent placed  . PAF (paroxysmal atrial fibrillation) (HCC)     was on amio, d/c'd due to prolonged PR interval, rate control  . Syncope 07/12    bradycardic, beta blocker decreased, also felt to be dehydrated  . Chronic kidney disease, stage 3   . Hypothyroid   . Coronary artery disease     Past Surgical History  Procedure Laterality Date  . Carotid stent      2012  . Cardiac catheterization  [id number]    dr. Onalee Hua harding, revealing a atretic bypass to her right with patent distal RCA stent  . Doppler echocardiography  644034    mild asymmetric left ventricular  hypertrophy, left ventricular systolic function is low normal, ejection fraction = 50-55%, the LA is moderate dilated, the RA is mildly dilated, no significant valvular disease  . Nm myoview ltd  [id number]    post stress left ventricle is normal in size, post stress ejection fraction is 67% global left ventricular systolic function is normal, normal myocardial perfusion study, abnormal myocardial perfusion study, low risk scan  . Left heart catheterization with coronary/graft angiogram N/A 10/12/2012    Procedure: LEFT HEART CATHETERIZATION WITH Isabel Caprice;  Surgeon: Marykay Lex, MD;  Location: [facility] CATH LAB;  Service: Cardiovascular;  Laterality: N/A;  . Loop recorder implant N/A 10/15/2012    Procedure: LOOP RECORDER IMPLANT;  Surgeon: Thurmon Fair, MD;  Location: [facility] CATH LAB;  Service: Cardiovascular;  Laterality: N/A;  . Coronary artery bypass graft  2006    LIMA-LAD, free RIMA-PDA, Radial-OM1-OM2    Current Outpatient Prescriptions  Medication Sig Dispense Refill  . acetaminophen (TYLENOL) 325 MG tablet Take 650 mg by mouth every 4 (four) hours as needed. For pain    . alum & mag hydroxide-simeth (MAALOX/MYLANTA) 200-200-20 MG/5ML suspension Take 15 mLs by mouth every 2 (two) hours as needed.    [name] amLODipine (NORVASC) 5 MG tablet Take 5 mg by mouth daily.    [name] apixaban (ELIQUIS) 2.5 MG TABS tablet Take 1 tablet (2.5 mg total) by mouth 2 (two) times daily. 42  tablet 0  . atorvastatin (LIPITOR) 80 MG tablet Take 80 mg by mouth daily.    . furosemide (LASIX) 40 MG tablet Take 1 tablet (40 mg total) by mouth daily. 90 tablet 3  . levothyroxine (SYNTHROID, LEVOTHROID) 50 MCG tablet Take 50 mcg by mouth daily.    . nitroGLYCERIN (NITROSTAT) 0.4 MG SL tablet Place 0.4 mg under the tongue every 5 (five) minutes as needed. For chest pain    . traMADol (ULTRAM) 50 MG tablet Take 50 mg by mouth every 6 (six) hours as needed for pain.    . valsartan (DIOVAN) 320 MG tablet TAKE 1 TABLET BY  MOUTH EVERY DAY 30 tablet 8   No current facility-administered medications for this visit.    Allergies:   Penicillins and Sulfa antibiotics   Social History   Social History  . Marital Status: Single    Spouse Name: N/A  . Number of Children: N/A  . Years of Education: N/A   Social History Main Topics  . Smoking status: Never Smoker   . Smokeless tobacco: Current User    Types: Snuff  . Alcohol Use: No  . Drug Use: No  . Sexual Activity: Not Asked   Other Topics Concern  . None   Social History Narrative     Family History:  The patient's family history includes CAD in her maternal grandmother and mother.   ROS:   Please see the history of present illness.    Review of Systems  All other systems reviewed and are negative.     PHYSICAL EXAM:   VS:  BP 138/66 mmHg  Pulse 56  Resp 16  Ht 5\' 3"  (1.6 m)  Wt 162 lb 8 oz (73.71 kg)  BMI 28.79 kg/m2   GEN: Well nourished, well developed, in no acute distress HEENT: normal Neck: no JVD, carotid bruits, or masses Cardiac: RRR; no murmurs, rubs, or gallops,no edema  Respiratory:  clear to auscultation bilaterally, normal work of breathing, healthy ILR site GI: soft, nontender, nondistended, + BS MS: no deformity or atrophy Skin: warm and dry, no rash Neuro:  Alert and Oriented x 3, Strength and sensation are intact Psych: euthymic mood, full affect  Wt Readings from Last 3 Encounters:  02/13/15 162 lb 8 oz (73.71 kg)  12/28/14 161 lb (73.029 kg)  11/09/14 160 lb 9.6 oz (72.848 kg)      Studies/Labs Reviewed:   EKG:  EKG is not ordered today.    Recent Labs: 01/05/2015: ALT 32*   Lipid Panel    Component Value Date/Time   CHOL 121* 01/05/2015 1118   TRIG 89 01/05/2015 1118   HDL 47 01/05/2015 1118   CHOLHDL 2.6 01/05/2015 1118   VLDL 18 01/05/2015 1118   LDLCALC 56 01/05/2015 1118     ASSESSMENT:    1. Coronary artery disease involving coronary bypass graft of native heart with other forms of  angina pectoris (HCC)   2. Paroxysmal atrial fibrillation (HCC)   3. Syncope and collapse   4. Essential hypertension   5. Chronic anticoagulation- Eloquis started and Plavix stopped 10/07/12   6. Hx of CABG X 4 3/06-       PLAN:  In order of problems listed above:  1. CAD - Angina free on current regimen. > 4 years since last revascularization procedure, > 2 years ago cath was OK. Atrial fibrillation is well rate controlled, not clearly associated with any symptoms and she is anticoagulated. Stop aspirin to reduce bleeding  risk No syncope after ILR implantation - suspect she had hypotension (nitrates and hypovolemia) or a bradyarrhythmia event (while on amiodarone and beta blocker combo, very long PR interval). HBP well controled   Medication Adjustments/Labs and Tests Ordered: Current medicines are reviewed at length with the patient today.  Concerns regarding medicines are outlined above.  Medication changes, Labs and Tests ordered today are listed below. Patient Instructions  Your physician has recommended you make the following change in your medication: STOP THE ASPIRIN   Dr. Royann Shivers recommends that you schedule a follow-up appointment in: ONE YEAR       SignedThurmon Fair, MD  02/13/2015 4:56 PM    [facility] [address] [place], [place], [place]  [address] Phone: [phone]; Fax: [phone]

## 2015-02-13 NOTE — Patient Instructions (Signed)
Your physician has recommended you make the following change in your medication: STOP THE ASPIRIN   Dr. Royann Shiversroitoru recommends that you schedule a follow-up appointment in: ONE YEAR

## 2015-02-15 DIAGNOSIS — E785 Hyperlipidemia, unspecified: Secondary | ICD-10-CM | POA: Diagnosis not present

## 2015-02-15 DIAGNOSIS — I130 Hypertensive heart and chronic kidney disease with heart failure and stage 1 through stage 4 chronic kidney disease, or unspecified chronic kidney disease: Secondary | ICD-10-CM | POA: Diagnosis not present

## 2015-02-15 DIAGNOSIS — I2510 Atherosclerotic heart disease of native coronary artery without angina pectoris: Secondary | ICD-10-CM | POA: Diagnosis not present

## 2015-02-15 DIAGNOSIS — I480 Paroxysmal atrial fibrillation: Secondary | ICD-10-CM | POA: Diagnosis not present

## 2015-02-15 DIAGNOSIS — N183 Chronic kidney disease, stage 3 (moderate): Secondary | ICD-10-CM | POA: Diagnosis not present

## 2015-02-15 DIAGNOSIS — E559 Vitamin D deficiency, unspecified: Secondary | ICD-10-CM | POA: Diagnosis not present

## 2015-02-15 DIAGNOSIS — M8580 Other specified disorders of bone density and structure, unspecified site: Secondary | ICD-10-CM | POA: Diagnosis not present

## 2015-02-21 ENCOUNTER — Ambulatory Visit (INDEPENDENT_AMBULATORY_CARE_PROVIDER_SITE_OTHER): Payer: Medicare Other | Admitting: *Deleted

## 2015-02-21 DIAGNOSIS — R55 Syncope and collapse: Secondary | ICD-10-CM | POA: Diagnosis not present

## 2015-02-21 NOTE — Progress Notes (Signed)
Carelink Summary Report / Loop Recorder 

## 2015-02-22 DIAGNOSIS — E785 Hyperlipidemia, unspecified: Secondary | ICD-10-CM | POA: Diagnosis not present

## 2015-02-22 DIAGNOSIS — I129 Hypertensive chronic kidney disease with stage 1 through stage 4 chronic kidney disease, or unspecified chronic kidney disease: Secondary | ICD-10-CM | POA: Diagnosis not present

## 2015-02-22 DIAGNOSIS — I509 Heart failure, unspecified: Secondary | ICD-10-CM | POA: Diagnosis not present

## 2015-02-22 DIAGNOSIS — D709 Neutropenia, unspecified: Secondary | ICD-10-CM | POA: Diagnosis not present

## 2015-03-20 ENCOUNTER — Ambulatory Visit (INDEPENDENT_AMBULATORY_CARE_PROVIDER_SITE_OTHER): Payer: Medicare Other | Admitting: *Deleted

## 2015-03-20 DIAGNOSIS — R55 Syncope and collapse: Secondary | ICD-10-CM

## 2015-03-21 NOTE — Progress Notes (Signed)
Carelink Summary Report / Loop Recorder 

## 2015-04-01 LAB — CUP PACEART REMOTE DEVICE CHECK: MDC IDC SESS DTM: 20161224150648

## 2015-04-19 ENCOUNTER — Ambulatory Visit (INDEPENDENT_AMBULATORY_CARE_PROVIDER_SITE_OTHER): Payer: Medicare Other | Admitting: *Deleted

## 2015-04-19 DIAGNOSIS — R55 Syncope and collapse: Secondary | ICD-10-CM

## 2015-04-19 NOTE — Progress Notes (Signed)
Carelink Summary Report / Loop Recorder 

## 2015-05-03 DIAGNOSIS — M62830 Muscle spasm of back: Secondary | ICD-10-CM | POA: Diagnosis not present

## 2015-05-03 DIAGNOSIS — N183 Chronic kidney disease, stage 3 (moderate): Secondary | ICD-10-CM | POA: Diagnosis not present

## 2015-05-03 DIAGNOSIS — I509 Heart failure, unspecified: Secondary | ICD-10-CM | POA: Diagnosis not present

## 2015-05-05 LAB — CUP PACEART REMOTE DEVICE CHECK: Date Time Interrogation Session: 20170123153712

## 2015-05-19 ENCOUNTER — Ambulatory Visit (INDEPENDENT_AMBULATORY_CARE_PROVIDER_SITE_OTHER): Payer: Medicare Other | Admitting: *Deleted

## 2015-05-19 DIAGNOSIS — R55 Syncope and collapse: Secondary | ICD-10-CM | POA: Diagnosis not present

## 2015-05-19 NOTE — Progress Notes (Signed)
Carelink Summary Report / Loop Recorder 

## 2015-06-07 DIAGNOSIS — I509 Heart failure, unspecified: Secondary | ICD-10-CM | POA: Diagnosis not present

## 2015-06-09 ENCOUNTER — Other Ambulatory Visit: Payer: Self-pay | Admitting: Cardiovascular Disease

## 2015-06-19 ENCOUNTER — Ambulatory Visit (INDEPENDENT_AMBULATORY_CARE_PROVIDER_SITE_OTHER): Payer: Medicare Other | Admitting: *Deleted

## 2015-06-19 DIAGNOSIS — R55 Syncope and collapse: Secondary | ICD-10-CM | POA: Diagnosis not present

## 2015-06-19 NOTE — Progress Notes (Signed)
Carelink Summary Report / Loop Recorder 

## 2015-07-07 LAB — CUP PACEART REMOTE DEVICE CHECK: Date Time Interrogation Session: 20170222160659

## 2015-07-07 NOTE — Progress Notes (Signed)
Carelink summary report received. Battery status OK. Normal device function. No new symptom episodes, tachy episodes, brady, or pause episodes. 250 AF episodes 22.4% +Eliquis. Monthly summary reports and ROV/PRN

## 2015-07-18 ENCOUNTER — Ambulatory Visit (INDEPENDENT_AMBULATORY_CARE_PROVIDER_SITE_OTHER): Payer: Medicare Other | Admitting: *Deleted

## 2015-07-18 DIAGNOSIS — R55 Syncope and collapse: Secondary | ICD-10-CM

## 2015-07-19 NOTE — Progress Notes (Signed)
Carelink Summary Report / Loop Recorder 

## 2015-07-20 DIAGNOSIS — R300 Dysuria: Secondary | ICD-10-CM | POA: Diagnosis not present

## 2015-07-20 DIAGNOSIS — N390 Urinary tract infection, site not specified: Secondary | ICD-10-CM | POA: Diagnosis not present

## 2015-07-21 ENCOUNTER — Telehealth: Payer: Self-pay | Admitting: Cardiology

## 2015-07-21 NOTE — Telephone Encounter (Signed)
Spoke w/ pt and requested that she send a manual transmission b/c her home monitor has not updated in at least 14 days.   

## 2015-07-22 LAB — CUP PACEART REMOTE DEVICE CHECK: MDC IDC SESS DTM: 20170324163633

## 2015-07-22 NOTE — Progress Notes (Signed)
[facility]link summary report received. [facility] Normal device function. No new symptom episodes, tachy episodes, brady, or pause episodes. 26% AF, +Eliquis, V rates controlled. ROV [facility] next available.

## 2015-07-24 LAB — CUP PACEART REMOTE DEVICE CHECK: Date Time Interrogation Session: 20170423170602

## 2015-07-24 NOTE — Progress Notes (Signed)
[facility]link summary report received[facility] Normal device function. No new symptom episodes, tachy episodes, brady, or pause episodes. 30.4% AF, +Eliquis, V rates controlled. ROV [facility] next available.

## 2015-07-26 ENCOUNTER — Encounter: Payer: Self-pay | Admitting: Cardiology

## 2015-07-26 ENCOUNTER — Ambulatory Visit (INDEPENDENT_AMBULATORY_CARE_PROVIDER_SITE_OTHER): Payer: Medicare Other | Admitting: Cardiology

## 2015-07-26 VITALS — BP 132/70 | HR 54 | Ht 63.0 in | Wt 159.4 lb

## 2015-07-26 DIAGNOSIS — I10 Essential (primary) hypertension: Secondary | ICD-10-CM | POA: Diagnosis not present

## 2015-07-26 DIAGNOSIS — Z951 Presence of aortocoronary bypass graft: Secondary | ICD-10-CM | POA: Diagnosis not present

## 2015-07-26 DIAGNOSIS — Z7901 Long term (current) use of anticoagulants: Secondary | ICD-10-CM

## 2015-07-26 DIAGNOSIS — Z955 Presence of coronary angioplasty implant and graft: Secondary | ICD-10-CM | POA: Diagnosis not present

## 2015-07-26 DIAGNOSIS — I482 Chronic atrial fibrillation: Secondary | ICD-10-CM

## 2015-07-26 DIAGNOSIS — I4821 Permanent atrial fibrillation: Secondary | ICD-10-CM

## 2015-07-26 NOTE — Assessment & Plan Note (Signed)
RIMA-PDA occluded, DES stent placed

## 2015-07-26 NOTE — Assessment & Plan Note (Signed)
CABG x 4 2006

## 2015-07-26 NOTE — Progress Notes (Signed)
07/26/2015 Stephanie Atkinson   07-Sep-1924  [id number]  Primary Physician Thayer HeadingsMACKENZIE,BRIAN, MD Primary Cardiologist: Dr Allyson SabalBerry  HPI:  82 y/o AA female with a history of CAD. She had CABG X 4 in 3/06. She had a DES placed to her PDA in July 2012, this was patent on follow up cath in Oct 2012 and 10/12/12. She did have distal disease that is to be treated medically. She has a history of PAF since her CABG in 2006 and had been on Toprol, Amiodarone, ASA, and Plavix. Eventually her Amiodarone was discontinued secondary to junctional rhythm and bradycardia. She has remained in AF with CVR which she tolerates well. CHADs VASc=5 and she is on Eliquis. She was admitted with a syncopal spell in Aug 2014 and underwent loop recorder implant 10/14/12. She was noted to be mildly dehydrated and this, as well as new nitrate therapy, may have contributed to her syncope. She is in the office today for follow up. She is in AF with VR 54. She is asymptomatic. She denies chest pain or palpitations.   Current Outpatient Prescriptions  Medication Sig Dispense Refill  . acetaminophen (TYLENOL) 325 MG tablet Take 650 mg by mouth every 4 (four) hours as needed. For pain    . alum & mag hydroxide-simeth (MAALOX/MYLANTA) 200-200-20 MG/5ML suspension Take 15 mLs by mouth every 2 (two) hours as needed.    [name]. amLODipine (NORVASC) 5 MG tablet Take 5 mg by mouth daily.    [name]. apixaban (ELIQUIS) 2.5 MG TABS tablet Take 1 tablet (2.5 mg total) by mouth 2 (two) times daily. 42 tablet 0  . atorvastatin (LIPITOR) 80 MG tablet Take 80 mg by mouth daily.    [name]. doxycycline (VIBRAMYCIN) 100 MG capsule Take 1 capsule by mouth daily. For 7 days.    . furosemide (LASIX) 40 MG tablet Take 1 tablet (40 mg total) by mouth daily. 90 tablet 3  . levothyroxine (SYNTHROID, LEVOTHROID) 50 MCG tablet Take 50 mcg by mouth daily.    . nitroGLYCERIN (NITROSTAT) 0.4 MG SL tablet Place 0.4 mg under the tongue every 5 (five) minutes as needed. For chest pain      . traMADol (ULTRAM) 50 MG tablet Take 50 mg by mouth every 6 (six) hours as needed for pain.    . valsartan (DIOVAN) 320 MG tablet TAKE 1 TABLET BY MOUTH EVERY DAY 30 tablet 8   No current facility-administered medications for this visit.    Allergies  Allergen Reactions  . Penicillins Hives  . Sulfa Antibiotics Swelling    Social History   Social History  . Marital Status: Single    Spouse Name: N/A  . Number of Children: N/A  . Years of Education: N/A   Occupational History  . Not on file.   Social History Main Topics  . Smoking status: Never Smoker   . Smokeless tobacco: Current User    Types: Snuff  . Alcohol Use: No  . Drug Use: No  . Sexual Activity: Not on file   Other Topics Concern  . Not on file   Social History Narrative     Review of Systems: General: negative for chills, fever, night sweats or weight changes.  Cardiovascular: negative for chest pain, dyspnea on exertion, edema, orthopnea, palpitations, paroxysmal nocturnal dyspnea or shortness of breath Dermatological: negative for rash Respiratory: negative for cough or wheezing Urologic: negative for hematuria Abdominal: negative for nausea, vomiting, diarrhea, bright red blood per rectum, melena, or hematemesis Neurologic: negative for  visual changes, syncope, or dizziness All other systems reviewed and are otherwise negative except as noted above.    Blood pressure 132/70, pulse 54, height  (1.6 m), weight 159 lb 6.4 oz (72.303 kg).  General appearance: alert, cooperative and no distress Neck: no carotid bruit and no JVD Lungs: clear to auscultation bilaterally Heart: irregularly irregular rhythm Extremities: trace edema Skin: Skin color, texture, turgor normal. No rashes or lesions Neurologic: Grossly normal  EKG AF, VR 54, LVH  ASSESSMENT AND PLAN:   Permanent atrial fibrillation (HCC) Slow VR, she tolerates this well  Hypertension Controlled  Hx of CABG X 4 3/06-  CABG x  4 2006  RCA DES placed 7/12- patent 8/14 RIMA-PDA occluded, DES stent placed  Chronic anticoagulation- Eloquis started and Plavix stopped 10/07/12 CHADs VASc=5   PLAN  No change in her medical Rx. She has had CRI in the past but her last SCr was normal. She will see her PCP in a few weeks and he draws her labs. I have asked her to send [place] a copy. F/U Dr Allyson Sabal in 6 months.   Corine Shelter PA-C 07/26/2015 10:43 AM

## 2015-07-26 NOTE — Assessment & Plan Note (Signed)
Slow VR, she tolerates this well

## 2015-07-26 NOTE — Assessment & Plan Note (Signed)
Controlled.  

## 2015-07-26 NOTE — Patient Instructions (Signed)
Your physician recommends that you schedule a follow-up appointment in: 6 months with Dr. Allyson SabalBerry  Continue your current therapy.

## 2015-07-26 NOTE — Assessment & Plan Note (Signed)
CHADs VASc=5 

## 2015-08-03 ENCOUNTER — Telehealth: Payer: Self-pay | Admitting: *Deleted

## 2015-08-03 NOTE — Telephone Encounter (Signed)
Called patient to make her aware that her LINQ has reached EOS.  Patient verbalizes understanding and wishes to discuss extraction with Dr. Royann Shiversroitoru.  Scheduled patient for appointment with Dr. Royann Shiversroitoru on 10/10/15 at 10:30am.  She is appreciative of call and denies questions or concerns at this time.

## 2015-08-23 DIAGNOSIS — E663 Overweight: Secondary | ICD-10-CM | POA: Diagnosis not present

## 2015-08-23 DIAGNOSIS — I509 Heart failure, unspecified: Secondary | ICD-10-CM | POA: Diagnosis not present

## 2015-08-23 DIAGNOSIS — Z0000 Encounter for general adult medical examination without abnormal findings: Secondary | ICD-10-CM | POA: Diagnosis not present

## 2015-08-23 DIAGNOSIS — E039 Hypothyroidism, unspecified: Secondary | ICD-10-CM | POA: Diagnosis not present

## 2015-08-23 DIAGNOSIS — I129 Hypertensive chronic kidney disease with stage 1 through stage 4 chronic kidney disease, or unspecified chronic kidney disease: Secondary | ICD-10-CM | POA: Diagnosis not present

## 2015-08-23 DIAGNOSIS — M8580 Other specified disorders of bone density and structure, unspecified site: Secondary | ICD-10-CM | POA: Diagnosis not present

## 2015-08-23 DIAGNOSIS — E559 Vitamin D deficiency, unspecified: Secondary | ICD-10-CM | POA: Diagnosis not present

## 2015-08-23 DIAGNOSIS — Z1389 Encounter for screening for other disorder: Secondary | ICD-10-CM | POA: Diagnosis not present

## 2015-08-29 LAB — CUP PACEART REMOTE DEVICE CHECK: MDC IDC SESS DTM: 20170523170552

## 2015-08-29 NOTE — Progress Notes (Signed)
Carelink summary report received. Battery status RRT since 05-13-15. Normal device function. No new symptom episodes, tachy episodes, brady, or pause episodes. 208 AF episodes 28.8% +Eliquis. Monthly summary reports and ROV/PRN

## 2015-08-31 DIAGNOSIS — Z008 Encounter for other general examination: Secondary | ICD-10-CM | POA: Diagnosis not present

## 2015-08-31 DIAGNOSIS — I480 Paroxysmal atrial fibrillation: Secondary | ICD-10-CM | POA: Diagnosis not present

## 2015-08-31 DIAGNOSIS — E785 Hyperlipidemia, unspecified: Secondary | ICD-10-CM | POA: Diagnosis not present

## 2015-08-31 DIAGNOSIS — E559 Vitamin D deficiency, unspecified: Secondary | ICD-10-CM | POA: Diagnosis not present

## 2015-09-06 DIAGNOSIS — M25561 Pain in right knee: Secondary | ICD-10-CM | POA: Diagnosis not present

## 2015-09-06 DIAGNOSIS — M25551 Pain in right hip: Secondary | ICD-10-CM | POA: Diagnosis not present

## 2015-10-10 ENCOUNTER — Encounter (INDEPENDENT_AMBULATORY_CARE_PROVIDER_SITE_OTHER): Payer: Self-pay

## 2015-10-10 ENCOUNTER — Encounter: Payer: Self-pay | Admitting: Cardiovascular Disease

## 2015-10-10 ENCOUNTER — Ambulatory Visit (INDEPENDENT_AMBULATORY_CARE_PROVIDER_SITE_OTHER): Payer: Medicare Other | Admitting: Cardiovascular Disease

## 2015-10-10 VITALS — BP 136/70 | HR 50 | Ht 63.0 in | Wt 152.4 lb

## 2015-10-10 DIAGNOSIS — I482 Chronic atrial fibrillation: Secondary | ICD-10-CM | POA: Diagnosis not present

## 2015-10-10 DIAGNOSIS — I10 Essential (primary) hypertension: Secondary | ICD-10-CM

## 2015-10-10 DIAGNOSIS — I25708 Atherosclerosis of coronary artery bypass graft(s), unspecified, with other forms of angina pectoris: Secondary | ICD-10-CM | POA: Diagnosis not present

## 2015-10-10 DIAGNOSIS — R55 Syncope and collapse: Secondary | ICD-10-CM

## 2015-10-10 DIAGNOSIS — I4821 Permanent atrial fibrillation: Secondary | ICD-10-CM

## 2015-10-10 DIAGNOSIS — Z4509 Encounter for adjustment and management of other cardiac device: Secondary | ICD-10-CM

## 2015-10-10 NOTE — Progress Notes (Signed)
Patient ID: Stephanie AspCora R Kowalchuk, female   DOB: 1924-10-14, 82 y.o.   MRN: [id number]    Cardiology Office Note    Date:  10/11/2015   ID:  Stephanie AspCora R Doddridge, DOB 1924-10-14, MRN [id number]  PCP:  Thayer HeadingsMACKENZIE,BRIAN, MD  Cardiologist:  Nanetta BattyJonathan Berry, MD  Thurmon FairMihai Sirus Labrie, MD   Chief Complaint  Patient presents with  . Follow-up    pacer check, had episode last week of shortness of breath, has edema    History of Present Illness:  Stephanie Atkinson is a 82 y.o. female with CAD s/p CABG, PAFib, implantable loop recorder (for syncope that has not recurred since implant).  She feels well. The patient specifically denies any chest pain at rest exertion, dyspnea at rest or with exertion, orthopnea, paroxysmal nocturnal dyspnea, syncope, palpitations, focal neurological deficits, intermittent claudication, lower extremity edema, unexplained weight gain, cough, hemoptysis or wheezing.  A couple weeks ago she had a brief episode where she was a little dizzy, had blurry vision and felt hot and flushed, improved after sitting down and drinking water.  Her loop recorder has reached end of service. Over time it has documented atrial fibrillation with good ventricular rate control, asymptomatic.  She has an enlarging hyperpigmented spot on her right cheek bone, where she had a "mole" removed years ago. I encouraged her to see a dermatologist again the lesion does not appear to be raised on bleeding or ulcerated but is at least 2 cm in maximum diameter.  Past Medical History:  Diagnosis Date  . Chronic kidney disease, stage 3   . Coronary artery disease   . Hx of CABG 2006   LIMA-LAD, free RIMA-PDA, Radial-OM1-OM2  . Hyperlipidemia   . Hypertension   . Hypothyroid   . MI, old       . PAF (paroxysmal atrial fibrillation) (HCC)    was on amio, d/c'd due to prolonged PR interval, rate control  . Presence of stent in right coronary artery 07/12   RIMA-PDA occluded, DES stent placed  . Syncope 07/12   bradycardic,  beta blocker decreased, also felt to be dehydrated    Past Surgical History:  Procedure Laterality Date  . CARDIAC CATHETERIZATION  [id number]   dr. Onalee Huadavid harding, revealing a atretic bypass to her right with patent distal RCA stent  . CAROTID STENT     2012  . CORONARY ARTERY BYPASS GRAFT  2006   LIMA-LAD, free RIMA-PDA, Radial-OM1-OM2  . DOPPLER ECHOCARDIOGRAPHY  [id number]   mild asymmetric left ventricular hypertrophy, left ventricular systolic function is low normal, ejection fraction = 50-55%, the LA is moderate dilated, the RA is mildly dilated, no significant valvular disease  . LEFT HEART CATHETERIZATION WITH CORONARY/GRAFT ANGIOGRAM N/A 10/12/2012   Procedure: LEFT HEART CATHETERIZATION WITH Isabel CapriceORONARY/GRAFT ANGIOGRAM;  Surgeon: Marykay Lexavid W Harding, MD;  [facility];  Service: Cardiovascular;  Laterality: N/A;  . LOOP RECORDER IMPLANT N/A 10/15/2012   Procedure: LOOP RECORDER IMPLANT;  Surgeon: Thurmon FairMihai Nil Xiong, MD;  Location: [facility] CATH LAB;  Service: Cardiovascular;  Laterality: N/A;  . NM MYOVIEW LTD  100510   post stress left ventricle is normal in size, post stress ejection fraction is 67% global left ventricular systolic function is normal, normal myocardial perfusion study, abnormal myocardial perfusion study, low risk scan    Current Outpatient Prescriptions  Medication Sig Dispense Refill  . acetaminophen (TYLENOL) 325 MG tablet Take 650 mg by mouth every 4 (four) hours as needed. For pain    . alum &  mag hydroxide-simeth (MAALOX/MYLANTA) 200-200-20 MG/5ML suspension Take 15 mLs by mouth every 2 (two) hours as needed.    [name] amLODipine (NORVASC) 5 MG tablet Take 5 mg by mouth daily.    [name] apixaban (ELIQUIS) 2.5 MG TABS tablet Take 1 tablet (2.5 mg total) by mouth 2 (two) times daily. 42 tablet 0  . atorvastatin (LIPITOR) 80 MG tablet Take 80 mg by mouth daily.    [name] doxycycline (VIBRAMYCIN) 100 MG capsule Take 1 capsule by mouth daily. For 7 days.    . furosemide (LASIX) 40 MG tablet  Take 1 tablet (40 mg total) by mouth daily. 90 tablet 3  . levothyroxine (SYNTHROID, LEVOTHROID) 50 MCG tablet Take 50 mcg by mouth daily.    . nitroGLYCERIN (NITROSTAT) 0.4 MG SL tablet Place 0.4 mg under the tongue every 5 (five) minutes as needed. For chest pain    . traMADol (ULTRAM) 50 MG tablet Take 50 mg by mouth every 6 (six) hours as needed for pain.    . valsartan (DIOVAN) 320 MG tablet TAKE 1 TABLET BY MOUTH EVERY DAY 30 tablet 8   No current facility-administered medications for this visit.     Allergies:   Penicillins and Sulfa antibiotics   Social History   Social History  . Marital status: Single    Spouse name: N/A  . Number of children: N/A  . Years of education: N/A   Social History Main Topics  . Smoking status: Never Smoker  . Smokeless tobacco: Current User    Types: Snuff  . Alcohol use No  . Drug use: No  . Sexual activity: Not Asked   Other Topics Concern  . None   Social History Narrative  . None     Family History:  The patient's family history includes CAD in her maternal grandmother and mother.   ROS:   Please see the history of present illness.    Review of Systems  All other systems reviewed and are negative.     PHYSICAL EXAM:   VS:  BP 136/70 (BP Location: Right Arm, Patient Position: Sitting, Cuff Size: Normal)   Pulse (!) 50   Ht 5\' 3"  (1.6 m)   Wt 152 lb 6 oz (69.1 kg)   BMI 26.99 kg/m    GEN: Well nourished, well developed, in no acute distress  HEENT: normal  Neck: no JVD, carotid bruits, or masses Cardiac: RRR; no murmurs, rubs, or gallops,no edema  Respiratory:  clear to auscultation bilaterally, normal work of breathing, healthy ILR site GI: soft, nontender, nondistended, + BS MS: no deformity or atrophy  Skin: warm and dry, no rash Neuro:  Alert and Oriented x 3, Strength and sensation are intact Psych: euthymic mood, full affect  Wt Readings from Last 3 Encounters:  10/10/15 152 lb 6 oz (69.1 kg)  07/26/15 159 lb  6.4 oz (72.3 kg)  02/13/15 162 lb 8 oz (73.7 kg)      Studies/Labs Reviewed:   EKG:  EKG is not ordered today.    Recent Labs: 01/05/2015: ALT 32   Lipid Panel    Component Value Date/Time   CHOL 121 (L) 01/05/2015 1118   TRIG 89 01/05/2015 1118   HDL 47 01/05/2015 1118   CHOLHDL 2.6 01/05/2015 1118   VLDL 18 01/05/2015 1118   LDLCALC 56 01/05/2015 1118     ASSESSMENT:    1. Coronary artery disease involving coronary bypass graft of native heart with other forms of angina pectoris (HCC)  2. Permanent atrial fibrillation (HCC)   3. Syncope and collapse   4. Essential hypertension   5. Encounter for loop recorder check      PLAN:  In order of problems listed above:  1. CAD - Angina free on current regimen. > 5 years since last revascularization procedure, about 3 years ago cath was OK. Atrial fibrillation is well rate controlled, not clearly associated with any symptoms and she is anticoagulated. No bleeding complications. CHADSVasc 5 (age 75, hypertension, CAD, gender). No syncope after ILR implantation - suspect she had hypotension (nitrates and hypovolemia). She had a recent episode of flushing and dizziness that was probably also related to hypotension  HBP well controled. Discussed the fact that if she is dehydrated her medications will prevent her from adjusting to hypotension. Avoid excessive heat and drink plenty of fluids. ILR: Has reached end of service. She prefers to leave the device in for the time being   Medication Adjustments/Labs and Tests Ordered: Current medicines are reviewed at length with the patient today.  Concerns regarding medicines are outlined above.  Medication changes, Labs and Tests ordered today are listed below. Patient Instructions  Dr Royann Shiversroitoru recommends that you schedule a follow-up appointment in 12 months. You will receive a reminder letter in the mail two months in advance. If you don't receive a letter, please call our office to  schedule the follow-up appointment.  If you need a refill on your cardiac medications before your next appointment, please call your pharmacy.   Signed, Thurmon FairMihai Breanah Faddis, MD  10/11/2015 1:00 PM    [facility] [address], [place], [place]  [address] Phone: [phone]; Fax: [phone]

## 2015-10-10 NOTE — Patient Instructions (Signed)
Dr Croitoru recommends that you schedule a follow-up appointment in 12 months. You will receive a reminder letter in the mail two months in advance. If you don't receive a letter, please call our office to schedule the follow-up appointment.  If you need a refill on your cardiac medications before your next appointment, please call your pharmacy. 

## 2015-10-11 ENCOUNTER — Telehealth: Payer: Self-pay | Admitting: Cardiovascular Disease

## 2015-10-11 NOTE — Telephone Encounter (Signed)
New message    Patient calling what does she do with two monitors at home.

## 2015-10-11 NOTE — Telephone Encounter (Signed)
Called patient to inform her that I would be sending her a return kit for her equipment.   Confirmed address with patient.  Patient voiced understanding.

## 2015-10-11 NOTE — Telephone Encounter (Signed)
Patient has ILR - reached EOS. She has decided to leave in loop recorder and not have removed Patient has 2 monitors that is like a telephone and one she carries around with her.  She wants to know what to do with these monitors/transmitters  Will route to device clinic to contact patient

## 2015-10-13 ENCOUNTER — Encounter (HOSPITAL_COMMUNITY): Payer: Self-pay | Admitting: Emergency Medicine

## 2015-10-13 ENCOUNTER — Emergency Department (HOSPITAL_COMMUNITY): Payer: Medicare Other

## 2015-10-13 ENCOUNTER — Emergency Department (HOSPITAL_COMMUNITY)
Admission: EM | Admit: 2015-10-13 | Discharge: 2015-10-14 | Disposition: A | Discharge status: Home / Self Care | Payer: Medicare Other | Attending: Emergency Medicine | Admitting: Emergency Medicine

## 2015-10-13 DIAGNOSIS — R52 Pain, unspecified: Secondary | ICD-10-CM | POA: Diagnosis not present

## 2015-10-13 DIAGNOSIS — G43909 Migraine, unspecified, not intractable, without status migrainosus: Secondary | ICD-10-CM | POA: Insufficient documentation

## 2015-10-13 DIAGNOSIS — G43009 Migraine without aura, not intractable, without status migrainosus: Secondary | ICD-10-CM

## 2015-10-13 DIAGNOSIS — I129 Hypertensive chronic kidney disease with stage 1 through stage 4 chronic kidney disease, or unspecified chronic kidney disease: Secondary | ICD-10-CM | POA: Diagnosis not present

## 2015-10-13 DIAGNOSIS — I252 Old myocardial infarction: Secondary | ICD-10-CM | POA: Insufficient documentation

## 2015-10-13 DIAGNOSIS — Z7901 Long term (current) use of anticoagulants: Secondary | ICD-10-CM | POA: Diagnosis not present

## 2015-10-13 DIAGNOSIS — G4489 Other headache syndrome: Secondary | ICD-10-CM | POA: Diagnosis not present

## 2015-10-13 DIAGNOSIS — Z79899 Other long term (current) drug therapy: Secondary | ICD-10-CM | POA: Insufficient documentation

## 2015-10-13 DIAGNOSIS — Z7982 Long term (current) use of aspirin: Secondary | ICD-10-CM | POA: Insufficient documentation

## 2015-10-13 DIAGNOSIS — I10 Essential (primary) hypertension: Secondary | ICD-10-CM

## 2015-10-13 DIAGNOSIS — N183 Chronic kidney disease, stage 3 (moderate): Secondary | ICD-10-CM | POA: Insufficient documentation

## 2015-10-13 DIAGNOSIS — R51 Headache: Secondary | ICD-10-CM | POA: Diagnosis not present

## 2015-10-13 DIAGNOSIS — Z951 Presence of aortocoronary bypass graft: Secondary | ICD-10-CM | POA: Insufficient documentation

## 2015-10-13 DIAGNOSIS — I2510 Atherosclerotic heart disease of native coronary artery without angina pectoris: Secondary | ICD-10-CM | POA: Insufficient documentation

## 2015-10-13 DIAGNOSIS — E039 Hypothyroidism, unspecified: Secondary | ICD-10-CM | POA: Insufficient documentation

## 2015-10-13 LAB — I-STAT CHEM 8, ED
BUN: 25 mg/dL — ABNORMAL HIGH (ref 6–20)
CALCIUM ION: 1.26 mmol/L — AB (ref 1.12–1.23)
CHLORIDE: 108 mmol/L (ref 101–111)
CREATININE: 0.9 mg/dL (ref 0.44–1.00)
GLUCOSE: 96 mg/dL (ref 65–99)
HCT: 44 % (ref 36.0–46.0)
Hemoglobin: 15 g/dL (ref 12.0–15.0)
Potassium: 5.5 mmol/L — ABNORMAL HIGH (ref 3.5–5.1)
Sodium: 142 mmol/L (ref 135–145)
TCO2: 27 mmol/L (ref 0–100)

## 2015-10-13 LAB — CBG MONITORING, ED: Glucose-Capillary: 116 mg/dL — ABNORMAL HIGH (ref 65–99)

## 2015-10-13 MED ORDER — HYDROMORPHONE HCL 1 MG/ML IJ SOLN
0.5000 mg | Freq: Once | INTRAMUSCULAR | Status: AC
  Administered 2015-10-13: 0.5 mg via INTRAVENOUS
  Filled 2015-10-13: qty 1

## 2015-10-13 MED ORDER — ONDANSETRON HCL 4 MG/2ML IJ SOLN
4.0000 mg | Freq: Once | INTRAMUSCULAR | Status: AC
Start: 2015-10-13 — End: 2015-10-13
  Administered 2015-10-13: 4 mg via INTRAVENOUS
  Filled 2015-10-13: qty 2

## 2015-10-13 NOTE — Discharge Instructions (Addendum)
Follow-up with your family doctor next week for recheck. Take her Ultram for pain

## 2015-10-13 NOTE — ED Triage Notes (Signed)
Per EMS, pt from home with c/o severe headache beginning at 1400 this afternoon. C/o pain on the left side of head. No neurological deficits noted.  BP-212/83, HR-76, SpO2-96% ra.

## 2015-10-13 NOTE — ED Notes (Signed)
Pt states pain is worse when she turns her head to the left side.

## 2015-10-14 LAB — POTASSIUM: Potassium: 4.4 mmol/L (ref 3.5–5.1)

## 2015-10-14 NOTE — ED Notes (Signed)
Pt verbalized understanding of discharge instructions and follow up care

## 2015-10-14 NOTE — ED Provider Notes (Signed)
Pt handed off to me by Dr. Agapito GamesZamit.  Pt's repeat potassium is fine.  Her headache is gone.  Her BP is much better.  She knows to return if worse and f/u with her PCP.   Jacalyn LefevreJulie Emmette Katt, MD 10/14/15 [phone]

## 2015-10-18 NOTE — ED Provider Notes (Signed)
[facility]-EMERGENCY DEPT Provider Note   CSN: [id number] Arrival date & time: 10/13/15  2039     History   Chief Complaint Chief Complaint  Patient presents with  . Migraine    HPI Stephanie Atkinson is a 82 y.o. female.  Pt complains of  A headache   The history is provided by the patient. No language interpreter was used.  Migraine  This is a new problem. The current episode started less than 1 hour ago. The problem occurs hourly. The problem has not changed since onset.Pertinent negatives include no chest pain, no abdominal pain and no headaches. Nothing aggravates the symptoms. Nothing relieves the symptoms. She has tried nothing for the symptoms.    Past Medical History:  Diagnosis Date  . Chronic kidney disease, stage 3   . Coronary artery disease   . Hx of CABG 2006   LIMA-LAD, free RIMA-PDA, Radial-OM1-OM2  . Hyperlipidemia   . Hypertension   . Hypothyroid   . MI, old       . PAF (paroxysmal atrial fibrillation) (HCC)    was on amio, d/c'd due to prolonged PR interval, rate control  . Presence of stent in right coronary artery 07/12   RIMA-PDA occluded, DES stent placed  . Syncope 07/12   bradycardic, beta blocker decreased, also felt to be dehydrated    Patient Active Problem List   Diagnosis Date Noted  . CAD (coronary artery disease) of artery bypass graft 02/13/2015  . Encounter for loop recorder check 02/01/2014  . Accelerated junctional rhythm 05/04/2013  . Dehydration 10/14/2012  . Syncope and collapse 10/13/2012  . History of first degree atrioventricular block 10/11/2012  . Chronic anticoagulation- Eloquis started and Plavix stopped 10/07/12 10/06/2012  . AVB - beta blocker and Amiodarone stopped 07/10/2012  . Hypertension   . Hx of CABG X 4 3/06-    . Dyslipidemia   . RCA DES placed 7/12- patent 8/14   . Permanent atrial fibrillation (HCC)   . Chronic kidney disease, stage 3     Past Surgical History:  Procedure Laterality Date  . CARDIAC  CATHETERIZATION  [id number]   dr. Onalee Huadavid harding, revealing a atretic bypass to her right with patent distal RCA stent  . CAROTID STENT     2012  . CORONARY ARTERY BYPASS GRAFT  2006   LIMA-LAD, free RIMA-PDA, Radial-OM1-OM2  . DOPPLER ECHOCARDIOGRAPHY  [id number]   mild asymmetric left ventricular hypertrophy, left ventricular systolic function is low normal, ejection fraction = 50-55%, the LA is moderate dilated, the RA is mildly dilated, no significant valvular disease  . LEFT HEART CATHETERIZATION WITH CORONARY/GRAFT ANGIOGRAM N/A 10/12/2012   Procedure: LEFT HEART CATHETERIZATION WITH Isabel CapriceORONARY/GRAFT ANGIOGRAM;  Surgeon: Marykay Lexavid W Harding, MD;  [facility];  Service: Cardiovascular;  Laterality: N/A;  . LOOP RECORDER IMPLANT N/A 10/15/2012   Procedure: LOOP RECORDER IMPLANT;  Surgeon: Thurmon FairMihai Croitoru, MD;  Location: [facility] CATH LAB;  Service: Cardiovascular;  Laterality: N/A;  . NM MYOVIEW LTD  100510   post stress left ventricle is normal in size, post stress ejection fraction is 67% global left ventricular systolic function is normal, normal myocardial perfusion study, abnormal myocardial perfusion study, low risk scan    OB History    No data available       Home Medications    Prior to Admission medications   Medication Sig Start Date End Date Taking? Authorizing Provider  acetaminophen (TYLENOL) 325 MG tablet Take 650 mg by mouth every 4 (four)  hours as needed. For pain   Yes Historical Provider, MD  amLODipine (NORVASC) 5 MG tablet Take 5 mg by mouth daily. 06/22/12  Yes Historical Provider, MD  apixaban (ELIQUIS) 2.5 MG TABS tablet Take 1 tablet (2.5 mg total) by mouth 2 (two) times daily. 07/18/14  Yes Abelino DerrickLuke K Kilroy, PA-C  aspirin EC 81 MG tablet Take 81 mg by mouth daily.   Yes Historical Provider, MD  atorvastatin (LIPITOR) 80 MG tablet Take 80 mg by mouth daily.   Yes Historical Provider, MD  furosemide (LASIX) 40 MG tablet Take 1 tablet (40 mg total) by mouth daily. 11/09/14  Yes  Rhonda G Barrett, PA-C  levothyroxine (SYNTHROID, LEVOTHROID) 50 MCG tablet Take 50 mcg by mouth daily.   Yes Historical Provider, MD  nitroGLYCERIN (NITROSTAT) 0.4 MG SL tablet Place 0.4 mg under the tongue every 5 (five) minutes as needed. For chest pain   Yes Historical Provider, MD  traMADol (ULTRAM) 50 MG tablet Take 50 mg by mouth every 6 (six) hours as needed for pain.   Yes Historical Provider, MD  valsartan (DIOVAN) 320 MG tablet TAKE 1 TABLET BY MOUTH EVERY DAY 01/31/15  Yes Runell GessJonathan J Berry, MD    Family History Family History  Problem Relation Age of Onset  . CAD Mother   . CAD Maternal Grandmother     Social History Social History  Substance Use Topics  . Smoking status: Never Smoker  . Smokeless tobacco: Current User    Types: Snuff  . Alcohol use No     Allergies   Penicillins and Sulfa antibiotics   Review of Systems Review of Systems  Constitutional: Negative for appetite change and fatigue.  HENT: Negative for congestion, ear discharge and sinus pressure.   Eyes: Negative for discharge.  Respiratory: Negative for cough.   Cardiovascular: Negative for chest pain.  Gastrointestinal: Negative for abdominal pain and diarrhea.  Genitourinary: Negative for frequency and hematuria.  Musculoskeletal: Negative for back pain.  Skin: Negative for rash.  Neurological: Negative for seizures and headaches.  Psychiatric/Behavioral: Negative for hallucinations.     Physical Exam Updated Vital Signs BP 157/76   Pulse 64   Temp 98.1 F (36.7 C) (Oral)   Resp 24   Ht 5\' 3"  (1.6 m)   Wt 152 lb (68.9 kg)   SpO2 95%   BMI 26.93 kg/m   Physical Exam  Constitutional: She is oriented to person, place, and time. She appears well-developed.  HENT:  Head: Normocephalic.  Eyes: Conjunctivae and EOM are normal. No scleral icterus.  Neck: Neck supple. No thyromegaly present.  Cardiovascular: Normal rate and regular rhythm.  Exam reveals no gallop and no friction rub.    No murmur heard. Pulmonary/Chest: No stridor. She has no wheezes. She has no rales. She exhibits no tenderness.  Abdominal: She exhibits no distension. There is no tenderness. There is no rebound.  Musculoskeletal: Normal range of motion. She exhibits no edema.  Lymphadenopathy:    She has no cervical adenopathy.  Neurological: She is oriented to person, place, and time. She exhibits normal muscle tone. Coordination normal.  Skin: No rash noted. No erythema.  Psychiatric: She has a normal mood and affect. Her behavior is normal.     ED Treatments / Results  Labs (all labs ordered are listed, but only abnormal results are displayed) Labs Reviewed  CBG MONITORING, ED - Abnormal; Notable for the following:       Result Value   Glucose-Capillary 116 (*)  All other components within normal limits  I-STAT CHEM 8, ED - Abnormal; Notable for the following:    Potassium 5.5 (*)    BUN 25 (*)    Calcium, Ion 1.26 (*)    All other components within normal limits  POTASSIUM    EKG  EKG Interpretation None       Radiology No results found.  Procedures Procedures (including critical care time)  Medications Ordered in ED Medications  HYDROmorphone (DILAUDID) injection 0.5 mg (0.5 mg Intravenous Given 10/13/15 2116)  ondansetron (ZOFRAN) injection 4 mg (4 mg Intravenous Given 10/13/15 2115)     Initial Impression / Assessment and Plan / ED Course  I have reviewed the triage vital signs and the nursing notes.  Pertinent labs & imaging results that were available during my care of the patient were reviewed by me and considered in my medical decision making (see chart for details).  Clinical Course    Headache improved  Final Clinical Impressions(s) / ED Diagnoses   Final diagnoses:  Nonintractable migraine, unspecified migraine type  Essential hypertension    New Prescriptions Discharge Medication List as of 10/14/2015 12:16 AM       Bethann Berkshire, MD 10/18/15  1404

## 2015-10-20 DIAGNOSIS — I129 Hypertensive chronic kidney disease with stage 1 through stage 4 chronic kidney disease, or unspecified chronic kidney disease: Secondary | ICD-10-CM | POA: Diagnosis not present

## 2015-10-20 DIAGNOSIS — R51 Headache: Secondary | ICD-10-CM | POA: Diagnosis not present

## 2015-10-20 DIAGNOSIS — Z23 Encounter for immunization: Secondary | ICD-10-CM | POA: Diagnosis not present

## 2015-10-24 ENCOUNTER — Other Ambulatory Visit: Payer: Self-pay | Admitting: Cardiovascular Disease

## 2015-10-24 NOTE — Telephone Encounter (Signed)
Rx request sent to pharmacy.  

## 2015-11-27 DIAGNOSIS — E559 Vitamin D deficiency, unspecified: Secondary | ICD-10-CM | POA: Diagnosis not present

## 2015-11-27 DIAGNOSIS — I10 Essential (primary) hypertension: Secondary | ICD-10-CM | POA: Diagnosis not present

## 2015-11-27 DIAGNOSIS — M8580 Other specified disorders of bone density and structure, unspecified site: Secondary | ICD-10-CM | POA: Diagnosis not present

## 2015-11-27 DIAGNOSIS — I5032 Chronic diastolic (congestive) heart failure: Secondary | ICD-10-CM | POA: Diagnosis not present

## 2015-11-27 DIAGNOSIS — E039 Hypothyroidism, unspecified: Secondary | ICD-10-CM | POA: Diagnosis not present

## 2015-11-30 DIAGNOSIS — I5032 Chronic diastolic (congestive) heart failure: Secondary | ICD-10-CM | POA: Diagnosis not present

## 2015-11-30 DIAGNOSIS — E785 Hyperlipidemia, unspecified: Secondary | ICD-10-CM | POA: Diagnosis not present

## 2015-11-30 DIAGNOSIS — N183 Chronic kidney disease, stage 3 (moderate): Secondary | ICD-10-CM | POA: Diagnosis not present

## 2015-11-30 DIAGNOSIS — I129 Hypertensive chronic kidney disease with stage 1 through stage 4 chronic kidney disease, or unspecified chronic kidney disease: Secondary | ICD-10-CM | POA: Diagnosis not present

## 2015-12-20 ENCOUNTER — Other Ambulatory Visit: Payer: Self-pay | Admitting: Internal Medicine

## 2015-12-20 ENCOUNTER — Ambulatory Visit
Admission: RE | Admit: 2015-12-20 | Discharge: 2015-12-20 | Disposition: A | Discharge status: Home / Self Care | Payer: Medicare Other | Source: Ambulatory Visit | Attending: Internal Medicine | Admitting: Internal Medicine

## 2015-12-20 ENCOUNTER — Ambulatory Visit (INDEPENDENT_AMBULATORY_CARE_PROVIDER_SITE_OTHER): Payer: Self-pay | Admitting: Orthopaedic Surgery

## 2015-12-20 ENCOUNTER — Telehealth (INDEPENDENT_AMBULATORY_CARE_PROVIDER_SITE_OTHER): Payer: Self-pay | Admitting: Radiology

## 2015-12-20 DIAGNOSIS — S0990XA Unspecified injury of head, initial encounter: Secondary | ICD-10-CM | POA: Diagnosis not present

## 2015-12-20 DIAGNOSIS — Z7901 Long term (current) use of anticoagulants: Secondary | ICD-10-CM | POA: Diagnosis not present

## 2015-12-20 DIAGNOSIS — Z9181 History of falling: Secondary | ICD-10-CM | POA: Diagnosis not present

## 2015-12-20 DIAGNOSIS — I10 Essential (primary) hypertension: Secondary | ICD-10-CM | POA: Diagnosis not present

## 2015-12-20 NOTE — Telephone Encounter (Signed)
Patient calling today, s/p fall yesterday evening. She is having pain and wants to make sure everything is ok. She would like to see Dr. Ophelia CharterYates since he was the one that had done surgery on her knee. Appt made at 3:15pm today.

## 2015-12-28 DIAGNOSIS — M549 Dorsalgia, unspecified: Secondary | ICD-10-CM | POA: Diagnosis not present

## 2016-01-08 ENCOUNTER — Telehealth: Payer: Self-pay | Admitting: Cardiovascular Disease

## 2016-01-08 ENCOUNTER — Emergency Department (HOSPITAL_COMMUNITY): Payer: Medicare Other

## 2016-01-08 ENCOUNTER — Emergency Department (HOSPITAL_COMMUNITY)
Admission: EM | Admit: 2016-01-08 | Discharge: 2016-01-08 | Disposition: A | Discharge status: Home / Self Care | Payer: Medicare Other | Attending: Emergency Medicine | Admitting: Emergency Medicine

## 2016-01-08 ENCOUNTER — Encounter (HOSPITAL_COMMUNITY): Payer: Self-pay | Admitting: Nurse Practitioner

## 2016-01-08 DIAGNOSIS — I10 Essential (primary) hypertension: Secondary | ICD-10-CM | POA: Diagnosis not present

## 2016-01-08 DIAGNOSIS — I252 Old myocardial infarction: Secondary | ICD-10-CM | POA: Insufficient documentation

## 2016-01-08 DIAGNOSIS — I160 Hypertensive urgency: Secondary | ICD-10-CM | POA: Insufficient documentation

## 2016-01-08 DIAGNOSIS — Z7982 Long term (current) use of aspirin: Secondary | ICD-10-CM | POA: Diagnosis not present

## 2016-01-08 DIAGNOSIS — Z951 Presence of aortocoronary bypass graft: Secondary | ICD-10-CM | POA: Insufficient documentation

## 2016-01-08 DIAGNOSIS — I2510 Atherosclerotic heart disease of native coronary artery without angina pectoris: Secondary | ICD-10-CM | POA: Insufficient documentation

## 2016-01-08 DIAGNOSIS — R42 Dizziness and giddiness: Secondary | ICD-10-CM | POA: Diagnosis present

## 2016-01-08 DIAGNOSIS — N183 Chronic kidney disease, stage 3 (moderate): Secondary | ICD-10-CM | POA: Diagnosis not present

## 2016-01-08 DIAGNOSIS — Z79899 Other long term (current) drug therapy: Secondary | ICD-10-CM | POA: Diagnosis not present

## 2016-01-08 DIAGNOSIS — E039 Hypothyroidism, unspecified: Secondary | ICD-10-CM | POA: Insufficient documentation

## 2016-01-08 DIAGNOSIS — Z7901 Long term (current) use of anticoagulants: Secondary | ICD-10-CM | POA: Insufficient documentation

## 2016-01-08 LAB — URINALYSIS, ROUTINE W REFLEX MICROSCOPIC
Bilirubin Urine: NEGATIVE
GLUCOSE, UA: NEGATIVE mg/dL
Hgb urine dipstick: NEGATIVE
KETONES UR: NEGATIVE mg/dL
LEUKOCYTES UA: NEGATIVE
NITRITE: NEGATIVE
PH: 7 (ref 5.0–8.0)
Protein, ur: NEGATIVE mg/dL
SPECIFIC GRAVITY, URINE: 1.005 (ref 1.005–1.030)

## 2016-01-08 LAB — BASIC METABOLIC PANEL
ANION GAP: 9 (ref 5–15)
BUN: 12 mg/dL (ref 6–20)
CHLORIDE: 108 mmol/L (ref 101–111)
CO2: 23 mmol/L (ref 22–32)
Calcium: 10.3 mg/dL (ref 8.9–10.3)
Creatinine, Ser: 0.98 mg/dL (ref 0.44–1.00)
GFR, EST AFRICAN AMERICAN: 58 mL/min — AB (ref >60)
GFR, EST NON AFRICAN AMERICAN: 50 mL/min — AB (ref >60)
Glucose, Bld: 107 mg/dL — ABNORMAL HIGH (ref 65–99)
POTASSIUM: 4.4 mmol/L (ref 3.5–5.1)
SODIUM: 140 mmol/L (ref 135–145)

## 2016-01-08 LAB — CBC
HEMATOCRIT: 42.7 % (ref 36.0–46.0)
HEMOGLOBIN: 13.7 g/dL (ref 12.0–15.0)
MCH: 27.3 pg (ref 26.0–34.0)
MCHC: 32.1 g/dL (ref 30.0–36.0)
MCV: 85.1 fL (ref 78.0–100.0)
Platelets: 183 10*3/uL (ref 150–400)
RBC: 5.02 MIL/uL (ref 3.87–5.11)
RDW: 14.3 % (ref 11.5–15.5)
WBC: 5.6 10*3/uL (ref 4.0–10.5)

## 2016-01-08 MED ORDER — HYDRALAZINE HCL 20 MG/ML IJ SOLN
5.0000 mg | Freq: Once | INTRAMUSCULAR | Status: AC
  Administered 2016-01-08: 5 mg via INTRAVENOUS
  Filled 2016-01-08: qty 1

## 2016-01-08 NOTE — Discharge Instructions (Signed)
As discussed, your evaluation today has been largely reassuring.  But, it is important that you monitor your condition carefully, and do not hesitate to return to the ED if you develop new, or concerning changes in your condition.  Otherwise, please follow-up with your physician for appropriate ongoing care.Please be sure to discuss tonight's emergency department evaluation, and your blood pressure medication.

## 2016-01-08 NOTE — Telephone Encounter (Signed)
Curt BearsJudy Rashad is calling about patient's elevated BP readings.  She would like to speak with RN and find out what she needs to do prior to her appt with Dr. Allyson SabalBerry this week.

## 2016-01-08 NOTE — ED Provider Notes (Signed)
[facility]-EMERGENCY DEPT Provider Note   CSN: [id number] Arrival date & time: 01/08/16  1640     History   Chief Complaint Chief Complaint  Patient presents with  . Hypertension    HPI Stephanie Atkinson is a 82 y.o. female.  HPI  Patient presents with concern of dizziness, hypertension. Patient has multiple medical issues including hypertension for which he takes multiple antihypertensive medication. She notes that about 2 days ago she began to have mild dizziness, without chest pain, or any pain. She also denies vision changes, confusion, disorientation. During that.  She took her blood pressure, and as she has found several times since, has had persistently elevated readings. No change in diet, activity, medication. No dysuria, hematuria, vomiting. Patient is here with multiple family members who assist with the history of present illness.   Past Medical History:  Diagnosis Date  . Chronic kidney disease, stage 3   . Coronary artery disease   . Hx of CABG 2006   LIMA-LAD, free RIMA-PDA, Radial-OM1-OM2  . Hyperlipidemia   . Hypertension   . Hypothyroid   . MI, old       . PAF (paroxysmal atrial fibrillation) (HCC)    was on amio, d/c'd due to prolonged PR interval, rate control  . Presence of stent in right coronary artery 07/12   RIMA-PDA occluded, DES stent placed  . Syncope 07/12   bradycardic, beta blocker decreased, also felt to be dehydrated    Patient Active Problem List   Diagnosis Date Noted  . CAD (coronary artery disease) of artery bypass graft 02/13/2015  . Encounter for loop recorder check 02/01/2014  . Accelerated junctional rhythm 05/04/2013  . Dehydration 10/14/2012  . Syncope and collapse 10/13/2012  . History of first degree atrioventricular block 10/11/2012  . Chronic anticoagulation- Eloquis started and Plavix stopped 10/07/12 10/06/2012  . AVB - beta blocker and Amiodarone stopped 07/10/2012  . Hypertension   . Hx of CABG X 4 3/06-    .  Dyslipidemia   . RCA DES placed 7/12- patent 8/14   . Permanent atrial fibrillation (HCC)   . Chronic kidney disease, stage 3     Past Surgical History:  Procedure Laterality Date  . CARDIAC CATHETERIZATION  [id number]   dr. Onalee Huadavid harding, revealing a atretic bypass to her right with patent distal RCA stent  . CAROTID STENT     2012  . CORONARY ARTERY BYPASS GRAFT  2006   LIMA-LAD, free RIMA-PDA, Radial-OM1-OM2  . DOPPLER ECHOCARDIOGRAPHY  [id number]   mild asymmetric left ventricular hypertrophy, left ventricular systolic function is low normal, ejection fraction = 50-55%, the LA is moderate dilated, the RA is mildly dilated, no significant valvular disease  . LEFT HEART CATHETERIZATION WITH CORONARY/GRAFT ANGIOGRAM N/A 10/12/2012   Procedure: LEFT HEART CATHETERIZATION WITH Isabel CapriceORONARY/GRAFT ANGIOGRAM;  Surgeon: Marykay Lexavid W Harding, MD;  [facility];  Service: Cardiovascular;  Laterality: N/A;  . LOOP RECORDER IMPLANT N/A 10/15/2012   Procedure: LOOP RECORDER IMPLANT;  Surgeon: Thurmon FairMihai Croitoru, MD;  Location: [facility] CATH LAB;  Service: Cardiovascular;  Laterality: N/A;  . NM MYOVIEW LTD  100510   post stress left ventricle is normal in size, post stress ejection fraction is 67% global left ventricular systolic function is normal, normal myocardial perfusion study, abnormal myocardial perfusion study, low risk scan    OB History    No data available       Home Medications    Prior to Admission medications   Medication Sig Start  Date End Date Taking? Authorizing Provider  acetaminophen (TYLENOL) 325 MG tablet Take 650 mg by mouth every 4 (four) hours as needed. For pain    Historical Provider, MD  amLODipine (NORVASC) 5 MG tablet Take 5 mg by mouth daily. 06/22/12   Historical Provider, MD  apixaban (ELIQUIS) 2.5 MG TABS tablet Take 1 tablet (2.5 mg total) by mouth 2 (two) times daily. 07/18/14   Abelino DerrickLuke K Kilroy, PA-C  aspirin EC 81 MG tablet Take 81 mg by mouth daily.    Historical Provider, MD   atorvastatin (LIPITOR) 80 MG tablet Take 80 mg by mouth daily.    Historical Provider, MD  furosemide (LASIX) 40 MG tablet Take 1 tablet (40 mg total) by mouth daily. 11/09/14   Rhonda G Barrett, PA-C  levothyroxine (SYNTHROID, LEVOTHROID) 50 MCG tablet Take 50 mcg by mouth daily.    Historical Provider, MD  nitroGLYCERIN (NITROSTAT) 0.4 MG SL tablet Place 0.4 mg under the tongue every 5 (five) minutes as needed. For chest pain    Historical Provider, MD  traMADol (ULTRAM) 50 MG tablet Take 50 mg by mouth every 6 (six) hours as needed for pain.    Historical Provider, MD  valsartan (DIOVAN) 320 MG tablet TAKE 1 TABLET BY MOUTH EVERY DAY 10/24/15   Runell GessJonathan J Berry, MD    Family History Family History  Problem Relation Age of Onset  . CAD Mother   . CAD Maternal Grandmother     Social History Social History  Substance Use Topics  . Smoking status: Never Smoker  . Smokeless tobacco: Current User    Types: Snuff  . Alcohol use No     Allergies   Penicillins and Sulfa antibiotics   Review of Systems Review of Systems  Constitutional:       Per HPI, otherwise negative  HENT:       Per HPI, otherwise negative  Respiratory:       Per HPI, otherwise negative  Cardiovascular:       Per HPI, otherwise negative  Gastrointestinal: Negative for vomiting.  Endocrine:       Negative aside from HPI  Genitourinary:       Neg aside from HPI   Musculoskeletal:       Per HPI, otherwise negative  Skin: Negative.   Neurological: Positive for dizziness. Negative for syncope.     Physical Exam Updated Vital Signs BP 158/64   Pulse (!) 55   Temp 98.1 F (36.7 C) (Oral)   Resp 18   SpO2 100%   Physical Exam  Constitutional: She is oriented to person, place, and time. She appears well-developed and well-nourished. No distress.  HENT:  Head: Normocephalic and atraumatic.  Eyes: Conjunctivae and EOM are normal.  Cardiovascular: Normal rate and regular rhythm.   Pulmonary/Chest:  Effort normal and breath sounds normal. No stridor. No respiratory distress.  Abdominal: She exhibits no distension.  Musculoskeletal: She exhibits no edema.  Neurological: She is alert and oriented to person, place, and time. Coordination normal.  Skin: Skin is warm and dry.  Psychiatric: She has a normal mood and affect.  Nursing note and vitals reviewed.    ED Treatments / Results  Labs (all labs ordered are listed, but only abnormal results are displayed) Labs Reviewed  BASIC METABOLIC PANEL - Abnormal; Notable for the following:       Result Value   Glucose, Bld 107 (*)    GFR calc non Af Amer 50 (*)  GFR calc Af Amer 58 (*)    All other components within normal limits  CBC  URINALYSIS, ROUTINE W REFLEX MICROSCOPIC (NOT AT [facility])    EKG  EKG Interpretation  Date/Time:  Monday January 08 2016 17:22:36 EST Ventricular Rate:  48 PR Interval:    QRS Duration: 86 QT Interval:  482 QTC Calculation: 430 R Axis:   64 Text Interpretation:  Atrial fibrillation T wave abnormality Artifact Abnormal ekg Confirmed by Gerhard Munch  MD [phone]) on 01/08/2016 7:57:54 PM       Radiology Dg Chest 2 View  Result Date: 01/08/2016 CLINICAL DATA:  Atrial fibrillation.  Hypertension for 3 days. EXAM: CHEST  2 VIEW COMPARISON:  Single-view of the chest 10/13/2012. FINDINGS: There is cardiomegaly without edema. The patient is status post CABG. Lungs are clear. No pneumothorax or pleural effusion. No focal bony abnormality. IMPRESSION: Cardiomegaly without acute disease. Electronically Signed   By: Drusilla Kanner M.D.   On: 01/08/2016 18:04    Procedures Procedures (including critical care time)  Medications Ordered in ED Medications  hydrALAZINE (APRESOLINE) injection 5 mg (5 mg Intravenous Given 01/08/16 2159)    Chart review notable for multiple evaluations from her cardiology and primary care team for dizziness, hypertensive urgency.  No discrete etiology thus  far.   Initial Impression / Assessment and Plan / ED Course  I have reviewed the triage vital signs and the nursing notes.  Pertinent labs & imaging results that were available during my care of the patient were reviewed by me and considered in my medical decision making (see chart for details). Initial results were reassuring, on repeat exam the patient in no distress. Clinical Course     10:14 PM  Patient in no distress, awake and alert speaking clearly, dizziness not present. Blood pressure has diminished mildly after provision of hydralazine. We discussed all findings, as well as the need to follow-up with primary care.   Final Clinical Impressions(s) / ED Diagnoses  Elderly female presents with dizziness, hypertensive urgency. Here she is awake, alert, with no evidence for CNS pathology. No end organ effects from mildly elevated blood pressure. Blood pressure diminished appropriately after provision of IV medication. With no new complaints, no red flags, and with multiple prior similar occurrences, the patient was appropriate for discharge with close outpatient follow-up.    Gerhard Munch, MD 01/08/16 2216

## 2016-01-08 NOTE — ED Triage Notes (Addendum)
Pt presents with c/o elevated blood pressure. For the past 2 days she has noticed her blood pressure has been in the 170s over 100s at home. She has been taking her blood pressure medication as prescribed. She has had no recent changes in blood pressure medication. She denies weakness, CP, sob, vision changes. She reports headaches, dizziness. She did fall 3 weeks ago and had a CT scan of her head which was normal. She is in atrial fibrillation on EKG today, which she denies a history of.

## 2016-01-08 NOTE — Telephone Encounter (Signed)
No answer. Goes to VM that drops/disconnects call.

## 2016-01-10 NOTE — Telephone Encounter (Signed)
No answer when dialed. 

## 2016-01-11 ENCOUNTER — Ambulatory Visit (INDEPENDENT_AMBULATORY_CARE_PROVIDER_SITE_OTHER): Payer: Medicare Other | Admitting: Cardiovascular Disease

## 2016-01-11 ENCOUNTER — Encounter: Payer: Self-pay | Admitting: Cardiovascular Disease

## 2016-01-11 VITALS — BP 150/60 | HR 58 | Ht 63.0 in | Wt 151.6 lb

## 2016-01-11 DIAGNOSIS — R55 Syncope and collapse: Secondary | ICD-10-CM

## 2016-01-11 DIAGNOSIS — Z951 Presence of aortocoronary bypass graft: Secondary | ICD-10-CM | POA: Diagnosis not present

## 2016-01-11 DIAGNOSIS — I10 Essential (primary) hypertension: Secondary | ICD-10-CM

## 2016-01-11 DIAGNOSIS — I25708 Atherosclerosis of coronary artery bypass graft(s), unspecified, with other forms of angina pectoris: Secondary | ICD-10-CM

## 2016-01-11 DIAGNOSIS — E785 Hyperlipidemia, unspecified: Secondary | ICD-10-CM

## 2016-01-11 DIAGNOSIS — I482 Chronic atrial fibrillation: Secondary | ICD-10-CM | POA: Diagnosis not present

## 2016-01-11 DIAGNOSIS — I4821 Permanent atrial fibrillation: Secondary | ICD-10-CM

## 2016-01-11 NOTE — Assessment & Plan Note (Signed)
History of syncope and collapse in the past. She had a loop recorder implanted 10/14/12 which has not demonstrated an etiology. She's had no recurrent symptoms.

## 2016-01-11 NOTE — Progress Notes (Signed)
01/11/2016 Stephanie Atkinson   [birth year]  [id number]  Primary Physician Thayer HeadingsMACKENZIE,BRIAN, MD Primary Cardiologist: Runell GessJonathan J Amiylah Anastos MD Roseanne RenoFACP, FACC, FAHA, FSCAI  HPI:  82 year old female with a history of CAD who I last saw in the office 12/28/14.[name]. She had CABG X 4 in 3/06. She had a DES placed to her PDA in July 2012, this was patent on follow up cath in Oct 2012 and recently 10/12/12. She did have distal disease that is to be treated medically. She h in the office 07/10/12 and was noted to have a long 1st degree AVB on her EKG, her PR was > 4. She was asymptomatic. She has a history of PAF since her CABG in 2006 and had been on Toprol, Amiodarone, ASA, and Plavix. At that time we stopped her Toprol and decreased her Amiodarone. When she was seen in follow up 07/29/12 her PR remained prolonged her Amiodarone was discontinued. On 10/06/12 she was seen and noted to be in AF with CVR. She was placed on Eloquis and her Plavix was Stopped. She the was admitted a few days later with chest pain with subtle EKG changes. She was cathed 10/12/12 and her anatomy was unchanged. She was discharge but readmitted later the 19th after a syncopal spell. She had a loop recorder implanted 10/14/12. She was noted to be mildly dehydrated and this, as well as new nitrate therapy, may have contributed to her syncope. . She has had no angina or syncope. Interrogation of her loop recorder was unrevealing.   Current Outpatient Prescriptions  Medication Sig Dispense Refill  . acetaminophen (TYLENOL) 325 MG tablet Take 650 mg by mouth every 4 (four) hours as needed. For pain    . amLODipine (NORVASC) 5 MG tablet Take 5 mg by mouth daily.    [name]. apixaban (ELIQUIS) 2.5 MG TABS tablet Take 1 tablet (2.5 mg total) by mouth 2 (two) times daily. 42 tablet 0  . atorvastatin (LIPITOR) 80 MG tablet Take 80 mg by mouth daily.    . furosemide (LASIX) 40 MG tablet Take 1 tablet (40 mg total) by mouth daily. 90 tablet 3  . levothyroxine  (SYNTHROID, LEVOTHROID) 50 MCG tablet Take 50 mcg by mouth daily.    . nitroGLYCERIN (NITROSTAT) 0.4 MG SL tablet Place 0.4 mg under the tongue every 5 (five) minutes as needed. For chest pain    . traMADol (ULTRAM) 50 MG tablet Take 50 mg by mouth 2 (two) times daily.     . valsartan (DIOVAN) 320 MG tablet TAKE 1 TABLET BY MOUTH EVERY DAY 30 tablet 11   No current facility-administered medications for this visit.     Allergies  Allergen Reactions  . Penicillins Hives  . Sulfa Antibiotics Swelling    Social History   Social History  . Marital status: Single    Spouse name: N/A  . Number of children: N/A  . Years of education: N/A   Occupational History  . Not on file.   Social History Main Topics  . Smoking status: Never Smoker  . Smokeless tobacco: Current User    Types: Snuff  . Alcohol use No  . Drug use: No  . Sexual activity: Not on file   Other Topics Concern  . Not on file   Social History Narrative  . No narrative on file     Review of Systems: General: negative for chills, fever, night sweats or weight changes.  Cardiovascular: negative for chest pain, dyspnea on  exertion, edema, orthopnea, palpitations, paroxysmal nocturnal dyspnea or shortness of breath Dermatological: negative for rash Respiratory: negative for cough or wheezing Urologic: negative for hematuria Abdominal: negative for nausea, vomiting, diarrhea, bright red blood per rectum, melena, or hematemesis Neurologic: negative for visual changes, syncope, or dizziness All other systems reviewed and are otherwise negative except as noted above.    Blood pressure (!) 150/60, pulse (!) 58, height 5\' 3"  (1.6 m), weight 151 lb 9.6 oz (68.8 kg), SpO2 97 %.  General appearance: alert and no distress Neck: no adenopathy, no carotid bruit, no JVD, supple, symmetrical, trachea midline and thyroid not enlarged, symmetric, no tenderness/mass/nodules Lungs: clear to auscultation bilaterally Heart:  irregularly irregular rhythm Extremities: extremities normal, atraumatic, no cyanosis or edema  EKG atrial fibrillation with a ventricular response of 58, LVH with repolarization changes. I personally reviewed this EKG.  ASSESSMENT AND PLAN:   Hypertension History of hypertension but blood pressure measured at 150/60. She is on amlodipine and valsartan. Continue current meds at current dosing  Hx of CABG X 4 3/06-  History of coronary artery disease status post coronary artery bypass grafting X 4 in March 2006. She had DES stent placed in her PDA July 2012. She'll follow-up in October 2012 as well as 10/12/12 show some "distal disease" that was elected to be treated medically. She denies chest pain or shortness of breath.  Dyslipidemia History of dyslipidemia on statin therapy followed by her PCP  Permanent atrial fibrillation (HCC) History of permanent atrial fibrillation rate controlled on Eliquis oral anticoagulation.  Syncope and collapse History of syncope and collapse in the past. She had a loop recorder implanted 10/14/12 which has not demonstrated an etiology. She's had no recurrent symptoms.      Runell GessJonathan J. Theodoro Koval MD FACP,FACC,FAHA, [facility]SC[facility] 01/11/2016 11:49 AM

## 2016-01-11 NOTE — Assessment & Plan Note (Signed)
History of permanent atrial fibrillation rate controlled on Eliquis oral anticoagulation 

## 2016-01-11 NOTE — Assessment & Plan Note (Signed)
History of coronary artery disease status post coronary artery bypass grafting X 4 in March 2006. She had DES stent placed in her PDA July 2012. She'll follow-up in October 2012 as well as 10/12/12 show some "distal disease" that was elected to be treated medically. She denies chest pain or shortness of breath.

## 2016-01-11 NOTE — Assessment & Plan Note (Signed)
History of hypertension but blood pressure measured at 150/60. She is on amlodipine and valsartan. Continue current meds at current dosing

## 2016-01-11 NOTE — Assessment & Plan Note (Signed)
History of dyslipidemia on statin therapy followed by her PCP 

## 2016-01-11 NOTE — Patient Instructions (Signed)

## 2016-01-15 ENCOUNTER — Telehealth: Payer: Self-pay | Admitting: Cardiovascular Disease

## 2016-01-15 NOTE — Telephone Encounter (Signed)
New message  Has Paperwork from Friday that she filled  Please mail to pt

## 2016-01-15 NOTE — Telephone Encounter (Signed)
Attempted to call. Phone busy. 

## 2016-01-15 NOTE — Telephone Encounter (Signed)
Routed to [place] to follow up.

## 2016-01-16 NOTE — Telephone Encounter (Signed)
Returning your call. °

## 2016-01-16 NOTE — Telephone Encounter (Signed)
Spoke to pt, she received her papers in the mail yesterday.

## 2016-01-23 ENCOUNTER — Ambulatory Visit: Payer: Medicare Other | Admitting: Cardiovascular Disease

## 2016-01-23 ENCOUNTER — Other Ambulatory Visit: Payer: Self-pay | Admitting: Cardiovascular Disease

## 2016-01-24 DIAGNOSIS — N183 Chronic kidney disease, stage 3 (moderate): Secondary | ICD-10-CM | POA: Diagnosis not present

## 2016-01-24 DIAGNOSIS — N390 Urinary tract infection, site not specified: Secondary | ICD-10-CM | POA: Diagnosis not present

## 2016-01-24 DIAGNOSIS — R300 Dysuria: Secondary | ICD-10-CM | POA: Diagnosis not present

## 2016-01-26 ENCOUNTER — Other Ambulatory Visit: Payer: Self-pay

## 2016-01-26 DIAGNOSIS — I480 Paroxysmal atrial fibrillation: Secondary | ICD-10-CM

## 2016-01-26 MED ORDER — APIXABAN 2.5 MG PO TABS: 2.5000 mg | ORAL_TABLET | Freq: Two times a day (BID) | ORAL | 6 refills | Status: DC

## 2016-01-29 ENCOUNTER — Other Ambulatory Visit: Payer: Self-pay | Admitting: Pharmacist Clinician (PhC)/ Clinical Pharmacy Specialist

## 2016-01-29 MED ORDER — APIXABAN 5 MG PO TABS: 5.0000 mg | ORAL_TABLET | Freq: Two times a day (BID) | ORAL | 5 refills | Status: DC

## 2016-01-29 NOTE — Telephone Encounter (Signed)
Changed eliquis from 2.5 mg bid to 5 mg bid.  Based on dosing guidelines patient should be on higher does.

## 2016-03-27 ENCOUNTER — Other Ambulatory Visit: Payer: Self-pay | Admitting: Cardiovascular Disease

## 2016-05-17 ENCOUNTER — Ambulatory Visit (INDEPENDENT_AMBULATORY_CARE_PROVIDER_SITE_OTHER): Payer: Medicare Other

## 2016-05-17 ENCOUNTER — Encounter (INDEPENDENT_AMBULATORY_CARE_PROVIDER_SITE_OTHER): Payer: Self-pay | Admitting: Orthopaedic Surgery

## 2016-05-17 ENCOUNTER — Ambulatory Visit (INDEPENDENT_AMBULATORY_CARE_PROVIDER_SITE_OTHER): Payer: Medicare Other | Admitting: Orthopaedic Surgery

## 2016-05-17 VITALS — BP 151/65 | HR 49 | Ht 64.0 in | Wt 152.0 lb

## 2016-05-17 DIAGNOSIS — M25561 Pain in right knee: Secondary | ICD-10-CM

## 2016-05-17 DIAGNOSIS — M25532 Pain in left wrist: Secondary | ICD-10-CM

## 2016-05-17 DIAGNOSIS — M654 Radial styloid tenosynovitis [de Quervain]: Secondary | ICD-10-CM | POA: Insufficient documentation

## 2016-05-17 DIAGNOSIS — G8929 Other chronic pain: Secondary | ICD-10-CM | POA: Diagnosis not present

## 2016-05-17 NOTE — Progress Notes (Signed)
Office Visit Note   Patient: Stephanie Atkinson           Date of Birth: 1924/05/28           MRN: [id number] Visit Date: 05/17/2016              Requested by: Thayer HeadingsBrian Mackenzie, MD [address], [address] [place] [id number] PCP: Thayer HeadingsMACKENZIE,BRIAN, MD   Assessment & Plan: Visit Diagnoses:  1. Chronic pain of right knee   2. Pain in left wrist   3. De Quervain's tenosynovitis     Plan: Patient has osteoarthritis consistent with chondromalacia right knee and intra-articular injection performed with good relief. First dorsal compartment injection left wrist performed for stenosing tenosynovitis. She tolerated the injection well good relief of pain. She can call should like to have a splint. We discussed the diagnosis options for treatment. She'll call if she has persistent symptoms.  Follow-Up Instructions: No Follow-up on file.   Orders:  Orders Placed This Encounter  Procedures  . Large Joint Injection/Arthrocentesis  . Medium Joint Injection/Arthrocentesis  . Hand/Upper Extremity Injection/Arthrocentesis  . XR Knee 1-2 Views Right  . XR Wrist Complete Left   No orders of the defined types were placed in this encounter.     Procedures: Large Joint Inj Date/Time: 05/17/2016 4:28 PM Performed by: Eldred MangesYATES, Peri Kreft C Authorized by: Eldred MangesYATES, Roman Sandall C   Consent Given by:  Patient Site marked: the procedure site was marked   Indications:  Pain and joint swelling Location:  Knee Site:  R knee Needle Size:  22 G Needle Length:  1.5 inches Approach:  Anterolateral Ultrasound Guidance: No   Fluoroscopic Guidance: No   Arthrogram: No   Medications:  1 mL lidocaine 1 %; 40 mg methylPREDNISolone acetate 40 MG/ML; 0.66 mL bupivacaine 0.25 % Aspiration Attempted: No   Patient tolerance:  Patient tolerated the procedure well with no immediate complications Hand/UE Inj Date/Time: 05/17/2016 4:29 PM Performed by: Eldred MangesYATES, Devlyn Parish C Authorized by: Annell GreeningYATES, Claron Rosencrans C   Condition: de Quervain's    Needle Size:  25 G Medications:  0.3 mL lidocaine 1 %; 0.5 mL bupivacaine 0.5 %; 20 mg methylPREDNISolone acetate 40 MG/ML     Clinical Data: No additional findings.   Subjective: Chief Complaint  Patient presents with  . Right Knee - Pain  . Left Wrist - Pain    Patient is here with complaint of two problems. She has chronic right knee pain that she has had x 10 years.  She does state that the knee is painful and swells. She is also having left wrist pain x 2 weeks. She fell in October, but denies any problems after the fall. Two weeks ago she developed pain when twisting her arm or working her thumb. She takes tramadol normally for pain.   Patient many years ago had a right wrist show veins release which did well. She doesn't exactly remember what year. She has pain with squeezing points to the first dorsal compartment where she has pain. Saw said 10 years chronic right knee pain.  Review of Systems 14 point review of system positive for previous D [place] release right wrist distant past. Hypertension CABG 4. Stage III kidney disease, chronic anticoagulation on alkalosis. Hyperlipidemia. Permanent atrial fib first-degree AV block. Syncope dehydration. Coronary artery disease. Knee osteoarthritis. Previous right knee injections   Objective: Vital Signs: BP (!) 151/65   Pulse (!) 49   Ht 5\' 4"  (1.626 m)   Wt 152 lb (68.9  kg)   BMI 26.09 kg/m   Physical Exam  Constitutional: She is oriented to person, place, and time. She appears well-developed.  HENT:  Head: Normocephalic.  Right Ear: External ear normal.  Left Ear: External ear normal.  Eyes: Pupils are equal, round, and reactive to light.  Neck: No tracheal deviation present. No thyromegaly present.  Cardiovascular: Normal rate.   Pulmonary/Chest: Effort normal.  Abdominal: Soft.  Musculoskeletal:  Patient has no shoulder impingement elbow which is full extension. No evidence of peripheral nerve compression in the  forearm or wrist. Exquisite tenderness left first dorsal compartment positive Finkelstein test negative Phalen's test at the wrist negative percussion test over the ulnar nerve at the elbow and hand. Right knee has a 2+ knee effusion tenderness for palpation over the joint line. Negative Homan distal pulses are intact. Collateral cruciate ligament exam right knee are normal no pain with hip range of motion negative straight leg raising.  Neurological: She is alert and oriented to person, place, and time.  Skin: Skin is warm and dry.  Psychiatric: She has a normal mood and affect. Her behavior is normal.    Ortho Exam  Specialty Comments:  No specialty comments available.  Imaging: No results found.   PMFS History: Patient Active Problem List   Diagnosis Date Noted  . Chronic pain of right knee 05/17/2016  . De Quervain's tenosynovitis 05/17/2016  . CAD (coronary artery disease) of artery bypass graft 02/13/2015  . Encounter for loop recorder check 02/01/2014  . Accelerated junctional rhythm 05/04/2013  . Dehydration 10/14/2012  . Syncope and collapse 10/13/2012  . History of first degree atrioventricular block 10/11/2012  . Chronic anticoagulation- Eloquis started and Plavix stopped 10/07/12 10/06/2012  . AVB - beta blocker and Amiodarone stopped 07/10/2012  . Hypertension   . Hx of CABG X 4 3/06-    . Dyslipidemia   . RCA DES placed 7/12- patent 8/14   . Permanent atrial fibrillation (HCC)   . Chronic kidney disease, stage 3    Past Medical History:  Diagnosis Date  . Chronic kidney disease, stage 3   . Coronary artery disease   . Hx of CABG 2006   LIMA-LAD, free RIMA-PDA, Radial-OM1-OM2  . Hyperlipidemia   . Hypertension   . Hypothyroid   . MI, old       . PAF (paroxysmal atrial fibrillation) (HCC)    was on amio, d/c'd due to prolonged PR interval, rate control  . Presence of stent in right coronary artery 07/12   RIMA-PDA occluded, DES stent placed  . Syncope  07/12   bradycardic, beta blocker decreased, also felt to be dehydrated    Family History  Problem Relation Age of Onset  . CAD Mother   . CAD Maternal Grandmother     Past Surgical History:  Procedure Laterality Date  . CARDIAC CATHETERIZATION  [id number]   dr. Onalee Hua harding, revealing a atretic bypass to her right with patent distal RCA stent  . CAROTID STENT     2012  . CORONARY ARTERY BYPASS GRAFT  2006   LIMA-LAD, free RIMA-PDA, Radial-OM1-OM2  . DOPPLER ECHOCARDIOGRAPHY  [id number]   mild asymmetric left ventricular hypertrophy, left ventricular systolic function is low normal, ejection fraction = 50-55%, the LA is moderate dilated, the RA is mildly dilated, no significant valvular disease  . LEFT HEART CATHETERIZATION WITH CORONARY/GRAFT ANGIOGRAM N/A 10/12/2012   Procedure: LEFT HEART CATHETERIZATION WITH Isabel Caprice;  Surgeon: Marykay Lex, MD;  Location: [facility] CATH  LAB;  Service: Cardiovascular;  Laterality: N/A;  . LOOP RECORDER IMPLANT N/A 10/15/2012   Procedure: LOOP RECORDER IMPLANT;  Surgeon: Thurmon Fair, MD;  Location: [facility] CATH LAB;  Service: Cardiovascular;  Laterality: N/A;  . NM MYOVIEW LTD  100510   post stress left ventricle is normal in size, post stress ejection fraction is 67% global left ventricular systolic function is normal, normal myocardial perfusion study, abnormal myocardial perfusion study, low risk scan   Social History   Occupational History  . Not on file.   Social History Main Topics  . Smoking status: Never Smoker  . Smokeless tobacco: Current User    Types: Snuff  . Alcohol use No  . Drug use: No  . Sexual activity: Not on file

## 2016-05-19 MED ORDER — METHYLPREDNISOLONE ACETATE 40 MG/ML IJ SUSP
20.0000 mg | INTRAMUSCULAR | Status: AC | PRN
  Administered 2016-05-17: 20 mg

## 2016-05-19 MED ORDER — BUPIVACAINE HCL 0.5 % IJ SOLN
0.5000 mL | INTRAMUSCULAR | Status: AC | PRN
  Administered 2016-05-17: .5 mL

## 2016-05-19 MED ORDER — LIDOCAINE HCL 1 % IJ SOLN
1.0000 mL | INTRAMUSCULAR | Status: AC | PRN
  Administered 2016-05-17: 1 mL

## 2016-05-19 MED ORDER — LIDOCAINE HCL 1 % IJ SOLN
0.3000 mL | INTRAMUSCULAR | Status: AC | PRN
  Administered 2016-05-17: .3 mL

## 2016-05-19 MED ORDER — METHYLPREDNISOLONE ACETATE 40 MG/ML IJ SUSP
40.0000 mg | INTRAMUSCULAR | Status: AC | PRN
  Administered 2016-05-17: 40 mg via INTRA_ARTICULAR

## 2016-05-19 MED ORDER — BUPIVACAINE HCL 0.25 % IJ SOLN
0.6600 mL | INTRAMUSCULAR | Status: AC | PRN
  Administered 2016-05-17: .66 mL via INTRA_ARTICULAR

## 2016-05-29 ENCOUNTER — Ambulatory Visit (INDEPENDENT_AMBULATORY_CARE_PROVIDER_SITE_OTHER): Payer: Self-pay | Admitting: Orthopaedic Surgery

## 2016-07-19 ENCOUNTER — Telehealth: Payer: Self-pay | Admitting: Cardiovascular Disease

## 2016-07-19 NOTE — Telephone Encounter (Signed)
Spoke with pt, she is having SOB that has increased from her normal. She reports having to stop while making the bed or getting dressed due to SOB. She recovers quickly after resting. She denies orthopnea. She reports swelling and burning sensation in feet and legs by the end of the day. She was recently seen by the dermatologist and was told her veins were big due to poor circulation in her legs. She keeps her legs elevated which does help the swelling. Was able to move appt with dr berry up to next week due to cancellation. She will call prior to appt with concerns. Pt agreed with this plan.

## 2016-07-19 NOTE — Telephone Encounter (Signed)
Pt have been having some shortness of breath and some concerns about her legs. She has an appointment on 07-30-16.

## 2016-07-24 ENCOUNTER — Encounter: Payer: Self-pay | Admitting: Cardiovascular Disease

## 2016-07-24 ENCOUNTER — Ambulatory Visit (INDEPENDENT_AMBULATORY_CARE_PROVIDER_SITE_OTHER): Payer: Medicare Other | Admitting: Cardiovascular Disease

## 2016-07-24 VITALS — BP 164/72 | HR 54 | Ht 63.0 in | Wt 149.6 lb

## 2016-07-24 DIAGNOSIS — E785 Hyperlipidemia, unspecified: Secondary | ICD-10-CM

## 2016-07-24 DIAGNOSIS — I10 Essential (primary) hypertension: Secondary | ICD-10-CM

## 2016-07-24 DIAGNOSIS — I4821 Permanent atrial fibrillation: Secondary | ICD-10-CM

## 2016-07-24 DIAGNOSIS — I482 Chronic atrial fibrillation: Secondary | ICD-10-CM | POA: Diagnosis not present

## 2016-07-24 DIAGNOSIS — Z951 Presence of aortocoronary bypass graft: Secondary | ICD-10-CM

## 2016-07-24 NOTE — Assessment & Plan Note (Signed)
History of hypertension blood pressure measured 164/72. She is on amlodipine and valsartan. Continue current meds at current dosing

## 2016-07-24 NOTE — Patient Instructions (Signed)

## 2016-07-24 NOTE — Assessment & Plan Note (Signed)
History of coronary artery disease status post bypass grafting times 04/28/2004 she had a drug-eluting stent placed to her PDA July 2012 which was patent on follow-up 2014. She gets occasional atypical chest pain.

## 2016-07-24 NOTE — Progress Notes (Signed)
07/24/2016 Stephanie Atkinson   05/11/1924  [id number]  Primary Physician Thayer Headings, MD Primary Cardiologist: Runell Gess MD [name]  HPI:    83 year old female with a history of CAD who I last saw in the office 01/11/16.[name] She had CABG X 4 in 3/06. She had a DES placed to her PDA in July 2012, this was patent on follow up cath in Oct 2012 and recently 10/12/12. She did have distal disease that is to be treated medically. She h in the office 07/10/12 and was noted to have a long 1st degree AVB on her EKG, her PR was > 4. She was asymptomatic. She has a history of PAF since her CABG in 2006 and had been on Toprol, Amiodarone, ASA, and Plavix. At that time we stopped her Toprol and decreased her Amiodarone. When she was seen in follow up 07/29/12 her PR remained prolonged her Amiodarone was discontinued. On 10/06/12 she was seen and noted to be in AF with CVR. She was placed on Eloquis and her Plavix was Stopped. She the was admitted a few days later with chest pain with subtle EKG changes. She was cathed 10/12/12 and her anatomy was unchanged. She was discharge but readmitted later the 19th after a syncopal spell. She had a loop recorder implanted 10/14/12. She was noted to be mildly dehydrated and this, as well as new nitrate therapy, may have contributed to her syncope. Since I saw her 6 months ago she's remained stable. She gets occasional atypical low sternal chest pain which is fleeting. She's had no further episodes of syncope.   Current Outpatient Prescriptions  Medication Sig Dispense Refill  . acetaminophen (TYLENOL) 325 MG tablet Take 650 mg by mouth every 4 (four) hours as needed. For pain    . amLODipine (NORVASC) 5 MG tablet Take 5 mg by mouth daily.    [name] apixaban (ELIQUIS) 5 MG TABS tablet Take 1 tablet (5 mg total) by mouth 2 (two) times daily. 60 tablet 5  . atorvastatin (LIPITOR) 80 MG tablet Take 80 mg by mouth daily.    . clobetasol cream (TEMOVATE) 0.05 %  Apply 1 application topically as directed.  3  . furosemide (LASIX) 40 MG tablet Take 1 tablet (40 mg total) by mouth daily. 90 tablet 3  . levothyroxine (SYNTHROID, LEVOTHROID) 50 MCG tablet Take 50 mcg by mouth daily.    . nitroGLYCERIN (NITROSTAT) 0.4 MG SL tablet Place 0.4 mg under the tongue every 5 (five) minutes as needed. For chest pain    . traMADol (ULTRAM) 50 MG tablet Take 50 mg by mouth 2 (two) times daily.     . valsartan (DIOVAN) 320 MG tablet TAKE 1 TABLET BY MOUTH EVERY DAY 30 tablet 11   No current facility-administered medications for this visit.     Allergies  Allergen Reactions  . Penicillins Hives  . Sulfa Antibiotics Swelling    Social History   Social History  . Marital status: Single    Spouse name: N/A  . Number of children: N/A  . Years of education: N/A   Occupational History  . Not on file.   Social History Main Topics  . Smoking status: Never Smoker  . Smokeless tobacco: Current User    Types: Snuff  . Alcohol use No  . Drug use: No  . Sexual activity: Not on file   Other Topics Concern  . Not on file   Social History Narrative  .  No narrative on file     Review of Systems: General: negative for chills, fever, night sweats or weight changes.  Cardiovascular: negative for chest pain, dyspnea on exertion, edema, orthopnea, palpitations, paroxysmal nocturnal dyspnea or shortness of breath Dermatological: negative for rash Respiratory: negative for cough or wheezing Urologic: negative for hematuria Abdominal: negative for nausea, vomiting, diarrhea, bright red blood per rectum, melena, or hematemesis Neurologic: negative for visual changes, syncope, or dizziness All other systems reviewed and are otherwise negative except as noted above.    Blood pressure (!) 164/72, pulse (!) 54, height 5\' 3"  (1.6 m), weight 149 lb 9.6 oz (67.9 kg).  General appearance: alert and no distress Neck: no adenopathy, no carotid bruit, no JVD, supple,  symmetrical, trachea midline and thyroid not enlarged, symmetric, no tenderness/mass/nodules Lungs: clear to auscultation bilaterally Heart: irregularly irregular rhythm Extremities: extremities normal, atraumatic, no cyanosis or edema  EKG Atrial fibrillation with ventricular response of 54, lethargy or hypertrophy with repolarization abnormality. I personally reviewed this EKG.  ASSESSMENT AND PLAN:   Hypertension History of hypertension blood pressure measured 164/72. She is on amlodipine and valsartan. Continue current meds at current dosing  Hx of CABG X 4 3/06-  History of coronary artery disease status post bypass grafting times 04/28/2004 she had a drug-eluting stent placed to her PDA July 2012 which was patent on follow-up 2014. She gets occasional atypical chest pain.  Dyslipidemia History of hyperlipidemia on statin therapy followed by her PCP  Permanent atrial fibrillation (HCC) History of permanent atrial fibrillation rate controlled on Eliquis oral anticoagulation.      Runell GessJonathan J. Arli Bree MD FACP,FACC,FAHA, [facility] 07/24/2016 9:31 AM

## 2016-07-24 NOTE — Assessment & Plan Note (Signed)
History of hyperlipidemia on statin therapy followed by her PCP. 

## 2016-07-24 NOTE — Assessment & Plan Note (Signed)
History of permanent atrial fibrillation rate controlled on Eliquis oral anticoagulation 

## 2016-07-25 ENCOUNTER — Other Ambulatory Visit: Payer: Self-pay | Admitting: Cardiovascular Disease

## 2016-07-29 ENCOUNTER — Other Ambulatory Visit: Payer: Self-pay | Admitting: *Deleted

## 2016-07-29 MED ORDER — VALSARTAN 320 MG PO TABS: 320.0000 mg | ORAL_TABLET | Freq: Every day | ORAL | 3 refills | Status: DC

## 2016-07-30 ENCOUNTER — Ambulatory Visit: Payer: Medicare Other | Admitting: Cardiovascular Disease

## 2016-08-19 ENCOUNTER — Other Ambulatory Visit: Payer: Self-pay | Admitting: Internal Medicine

## 2016-08-19 DIAGNOSIS — I872 Venous insufficiency (chronic) (peripheral): Secondary | ICD-10-CM

## 2016-09-04 ENCOUNTER — Ambulatory Visit
Admission: RE | Admit: 2016-09-04 | Discharge: 2016-09-04 | Disposition: A | Discharge status: Home / Self Care | Payer: Medicare Other | Source: Ambulatory Visit | Attending: Internal Medicine | Admitting: Internal Medicine

## 2016-09-04 ENCOUNTER — Other Ambulatory Visit: Payer: Self-pay

## 2016-09-04 DIAGNOSIS — M79605 Pain in left leg: Principal | ICD-10-CM

## 2016-09-04 DIAGNOSIS — I872 Venous insufficiency (chronic) (peripheral): Secondary | ICD-10-CM

## 2016-09-04 DIAGNOSIS — G8929 Other chronic pain: Secondary | ICD-10-CM

## 2016-09-04 DIAGNOSIS — M79604 Pain in right leg: Principal | ICD-10-CM

## 2016-09-11 ENCOUNTER — Other Ambulatory Visit (HOSPITAL_COMMUNITY): Payer: Self-pay | Admitting: Internal Medicine

## 2016-09-11 DIAGNOSIS — G8929 Other chronic pain: Secondary | ICD-10-CM

## 2016-09-11 DIAGNOSIS — M79604 Pain in right leg: Principal | ICD-10-CM

## 2016-09-11 DIAGNOSIS — M79605 Pain in left leg: Principal | ICD-10-CM

## 2016-09-12 ENCOUNTER — Other Ambulatory Visit: Payer: Medicare Other

## 2016-09-19 ENCOUNTER — Other Ambulatory Visit: Payer: Self-pay

## 2016-09-19 DIAGNOSIS — I872 Venous insufficiency (chronic) (peripheral): Secondary | ICD-10-CM

## 2016-09-19 DIAGNOSIS — G8929 Other chronic pain: Secondary | ICD-10-CM

## 2016-09-19 DIAGNOSIS — M79605 Pain in left leg: Principal | ICD-10-CM

## 2016-09-19 DIAGNOSIS — M79604 Pain in right leg: Principal | ICD-10-CM

## 2016-09-20 ENCOUNTER — Ambulatory Visit (HOSPITAL_COMMUNITY)
Admission: RE | Admit: 2016-09-20 | Discharge: 2016-09-20 | Disposition: A | Discharge status: Home / Self Care | Payer: Medicare Other | Source: Ambulatory Visit | Attending: Cardiovascular Disease | Admitting: Cardiovascular Disease

## 2016-09-20 DIAGNOSIS — M79604 Pain in right leg: Secondary | ICD-10-CM | POA: Insufficient documentation

## 2016-09-20 DIAGNOSIS — M79605 Pain in left leg: Secondary | ICD-10-CM | POA: Diagnosis present

## 2016-09-20 DIAGNOSIS — I739 Peripheral vascular disease, unspecified: Secondary | ICD-10-CM

## 2016-09-20 DIAGNOSIS — G8929 Other chronic pain: Secondary | ICD-10-CM

## 2016-09-25 ENCOUNTER — Encounter (HOSPITAL_COMMUNITY): Payer: Medicare Other

## 2016-10-02 ENCOUNTER — Ambulatory Visit (INDEPENDENT_AMBULATORY_CARE_PROVIDER_SITE_OTHER): Payer: Medicare Other

## 2016-10-02 ENCOUNTER — Ambulatory Visit (INDEPENDENT_AMBULATORY_CARE_PROVIDER_SITE_OTHER): Payer: Medicare Other | Admitting: Orthopaedic Surgery

## 2016-10-02 DIAGNOSIS — M5442 Lumbago with sciatica, left side: Secondary | ICD-10-CM | POA: Diagnosis not present

## 2016-10-02 DIAGNOSIS — M25552 Pain in left hip: Secondary | ICD-10-CM

## 2016-10-02 DIAGNOSIS — M1711 Unilateral primary osteoarthritis, right knee: Secondary | ICD-10-CM | POA: Diagnosis not present

## 2016-10-02 NOTE — Progress Notes (Signed)
Office Visit Note   Patient: Stephanie Atkinson           Date of Birth: [birth year]           MRN: [id number] Visit Date: 10/02/2016              Requested by: No referring provider defined for this encounter. PCP: No primary care provider on file.   Assessment & Plan: Visit Diagnoses:  1. Pain in left hip   2. Acute left-sided low back pain with left-sided sciatica   3. Unilateral primary osteoarthritis, right knee     Plan: Right knee injection performed which she tolerated well. She can ambulate better after the injection with improvement in her pain.  Follow-Up Instructions: No Follow-up on file.   Orders:  Orders Placed This Encounter  Procedures  . Large Joint Injection/Arthrocentesis  . XR HIP UNILAT W OR W/O PELVIS 1V LEFT  . XR Lumbar Spine 2-3 Views   No orders of the defined types were placed in this encounter.     Procedures: Large Joint Inj Date/Time: 10/02/2016 11:49 AM Performed by: Eldred Manges Authorized by: Annell Greening C   Consent Given by:  Patient Indications:  Pain and joint swelling Location:  Knee Site:  R knee Needle Size:  22 G Needle Length:  1.5 inches Approach:  Anterolateral Ultrasound Guidance: No   Fluoroscopic Guidance: No   Arthrogram: No   Medications:  0.66 mL bupivacaine 0.25 %; 1 mL lidocaine 1 %; 40 mg methylPREDNISolone acetate 40 MG/ML Aspiration Attempted: No   Patient tolerance:  Patient tolerated the procedure well with no immediate complications      Clinical Data: No additional findings.   Subjective: No chief complaint on file.   HPI 83-year-old female returns was some ongoing back pain symptoms pain laterally over her trochanter on the left and increasing right knee pain primarily medial compartment. Previous x-rays had shown some osteoarthritis of her knee moderate in severity. She has not fallen. She is on chronic anticoagulation and has heart disease and chronic kidney disease. 5 months ago she got many  months of good relief with intra-articular knee injection.  Review of Systems positive for hypertension, CABG 4, stage III kidney disease, chronic anticoagulation, hyperlipidemia, atrial fib with first-degree block, syncope, history of dehydration, right greater than left knee osteoarthritis with previous good relief with injections, 14 point otherwise negative as it pertains or history of present illness.   Objective: Vital Signs: There were no vitals taken for this visit.  Physical Exam  Constitutional: She is oriented to person, place, and time. She appears well-developed.  HENT:  Head: Normocephalic.  Right Ear: External ear normal.  Left Ear: External ear normal.  Eyes: Pupils are equal, round, and reactive to light.  Neck: No tracheal deviation present. No thyromegaly present.  Cardiovascular: Intact distal pulses.   Pulmonary/Chest: Effort normal.  Abdominal: Soft.  Neurological: She is alert and oriented to person, place, and time.  Skin: Skin is warm and dry.  Psychiatric: She has a normal mood and affect. Her behavior is normal.   pulse is irregularly irregular  Ortho Exam patient has some trochanteric bursal tenderness the left mild sciatic notch tenderness on the left. Negative Trendelenburg gait. No significant pain with internal/external rotation of her hips. The synovitis on the right with medial joint line tenderness collateral cruciate ligament exam is normal she has palpable distal pulse.  Specialty Comments:  No specialty comments available.  Imaging: No results  found.   PMFS History: Patient Active Problem List   Diagnosis Date Noted  . Chronic pain of right knee 05/17/2016  . De Quervain's tenosynovitis 05/17/2016  . CAD (coronary artery disease) of artery bypass graft 02/13/2015  . Encounter for loop recorder check 02/01/2014  . Accelerated junctional rhythm 05/04/2013  . Dehydration 10/14/2012  . Syncope and collapse 10/13/2012  . History of first  degree atrioventricular block 10/11/2012  . Chronic anticoagulation- Eloquis started and Plavix stopped 10/07/12 10/06/2012  . AVB - beta blocker and Amiodarone stopped 07/10/2012  . Hypertension   . Hx of CABG X 4 3/06-    . Dyslipidemia   . RCA DES placed 7/12- patent 8/14   . Permanent atrial fibrillation (HCC)   . Chronic kidney disease, stage 3    Past Medical History:  Diagnosis Date  . Chronic kidney disease, stage 3   . Coronary artery disease   . Hx of CABG 2006   LIMA-LAD, free RIMA-PDA, Radial-OM1-OM2  . Hyperlipidemia   . Hypertension   . Hypothyroid   . MI, old       . PAF (paroxysmal atrial fibrillation) (HCC)    was on amio, d/c'd due to prolonged PR interval, rate control  . Presence of stent in right coronary artery 07/12   RIMA-PDA occluded, DES stent placed  . Syncope 07/12   bradycardic, beta blocker decreased, also felt to be dehydrated    Family History  Problem Relation Age of Onset  . CAD Mother   . CAD Maternal Grandmother     Past Surgical History:  Procedure Laterality Date  . CARDIAC CATHETERIZATION  [id number]   dr. Onalee Huadavid harding, revealing a atretic bypass to her right with patent distal RCA stent  . CAROTID STENT     2012  . CORONARY ARTERY BYPASS GRAFT  2006   LIMA-LAD, free RIMA-PDA, Radial-OM1-OM2  . DOPPLER ECHOCARDIOGRAPHY  [id number]   mild asymmetric left ventricular hypertrophy, left ventricular systolic function is low normal, ejection fraction = 50-55%, the LA is moderate dilated, the RA is mildly dilated, no significant valvular disease  . LEFT HEART CATHETERIZATION WITH CORONARY/GRAFT ANGIOGRAM N/A 10/12/2012   Procedure: LEFT HEART CATHETERIZATION WITH Isabel CapriceORONARY/GRAFT ANGIOGRAM;  Surgeon: Marykay Lexavid W Harding, MD;  [facility];  Service: Cardiovascular;  Laterality: N/A;  . LOOP RECORDER IMPLANT N/A 10/15/2012   Procedure: LOOP RECORDER IMPLANT;  Surgeon: Thurmon FairMihai Croitoru, MD;  Location: [facility] CATH LAB;  Service: Cardiovascular;   Laterality: N/A;  . NM MYOVIEW LTD  100510   post stress left ventricle is normal in size, post stress ejection fraction is 67% global left ventricular systolic function is normal, normal myocardial perfusion study, abnormal myocardial perfusion study, low risk scan   Social History   Occupational History  . Not on file.   Social History Main Topics  . Smoking status: Never Smoker  . Smokeless tobacco: Current User    Types: Snuff  . Alcohol use No  . Drug use: No  . Sexual activity: Not on file

## 2016-10-03 MED ORDER — LIDOCAINE HCL 1 % IJ SOLN
1.0000 mL | INTRAMUSCULAR | Status: AC | PRN
  Administered 2016-10-02: 1 mL

## 2016-10-03 MED ORDER — BUPIVACAINE HCL 0.25 % IJ SOLN
0.6600 mL | INTRAMUSCULAR | Status: AC | PRN
  Administered 2016-10-02: .66 mL via INTRA_ARTICULAR

## 2016-10-03 MED ORDER — METHYLPREDNISOLONE ACETATE 40 MG/ML IJ SUSP
40.0000 mg | INTRAMUSCULAR | Status: AC | PRN
  Administered 2016-10-02: 40 mg via INTRA_ARTICULAR

## 2016-11-18 ENCOUNTER — Encounter: Payer: Self-pay | Admitting: Cardiovascular Disease

## 2016-11-18 ENCOUNTER — Ambulatory Visit (INDEPENDENT_AMBULATORY_CARE_PROVIDER_SITE_OTHER): Payer: Medicare Other | Admitting: Cardiovascular Disease

## 2016-11-18 VITALS — BP 140/60 | HR 52 | Ht 63.0 in | Wt 151.0 lb

## 2016-11-18 DIAGNOSIS — Z79899 Other long term (current) drug therapy: Secondary | ICD-10-CM | POA: Diagnosis not present

## 2016-11-18 DIAGNOSIS — Z87898 Personal history of other specified conditions: Secondary | ICD-10-CM | POA: Diagnosis not present

## 2016-11-18 DIAGNOSIS — I25708 Atherosclerosis of coronary artery bypass graft(s), unspecified, with other forms of angina pectoris: Secondary | ICD-10-CM | POA: Diagnosis not present

## 2016-11-18 DIAGNOSIS — I10 Essential (primary) hypertension: Secondary | ICD-10-CM

## 2016-11-18 DIAGNOSIS — Z7901 Long term (current) use of anticoagulants: Secondary | ICD-10-CM

## 2016-11-18 DIAGNOSIS — I481 Persistent atrial fibrillation: Secondary | ICD-10-CM | POA: Diagnosis not present

## 2016-11-18 DIAGNOSIS — E785 Hyperlipidemia, unspecified: Secondary | ICD-10-CM | POA: Diagnosis not present

## 2016-11-18 DIAGNOSIS — I4819 Other persistent atrial fibrillation: Secondary | ICD-10-CM

## 2016-11-18 NOTE — Patient Instructions (Signed)
Dr Royann Shivers recommends that you continue on your current medications as directed. Please refer to the Current Medication list given to you today.  Your physician recommends that you return for lab work at your earliest convenience - FASTING.  Your physician recommends that you schedule a follow-up appointment in 6 months with Dr Allyson Sabal.  Dr Royann Shivers recommends that you schedule a follow-up appointment in 12 months. You will receive a reminder letter in the mail two months in advance. If you don't receive a letter, please call our office to schedule the follow-up appointment.  If you need a refill on your cardiac medications before your next appointment, please call your pharmacy.

## 2016-11-18 NOTE — Progress Notes (Signed)
Patient ID: Stephanie Atkinson, female   DOB: 20-Jun-1924, 83 y.o.   MRN: [id number]    Cardiology Office Note    Date:  11/19/2016   ID:  Stephanie Atkinson, DOB [birth year], MRN [id number]  PCP:  System, Pcp Not In  Cardiologist:  Nanetta Batty, MD  Thurmon Fair, MD   Chief Complaint  Patient presents with  . Follow-up    History of Present Illness:  Stephanie Atkinson is a 83 y.o. female with CAD s/p CABG, PAFib. She received an implantable loop recorder (for syncope that has not recurred since implant), but the device has reached end of service..  The patient specifically denies any chest pain at rest exertion, dyspnea at rest or with exertion, orthopnea, paroxysmal nocturnal dyspnea, syncope, palpitations, focal neurological deficits, intermittent claudication, lower extremity edema, unexplained weight gain, cough, hemoptysis or wheezing. She is unaware of the fact that she is in atrial fibrillation. She has relatively slow ventricular rate at 52, but currently denies dizziness, visual changes, syncope or lightheadedness. She has not had any bleeding problems on anticoagulation with Eliquis.  Past Medical History:  Diagnosis Date  . Chronic kidney disease, stage 3   . Coronary artery disease   . Hx of CABG 2006   LIMA-LAD, free RIMA-PDA, Radial-OM1-OM2  . Hyperlipidemia   . Hypertension   . Hypothyroid   . MI, old       . PAF (paroxysmal atrial fibrillation) (HCC)    was on amio, d/c'd due to prolonged PR interval, rate control  . Presence of stent in right coronary artery 07/12   RIMA-PDA occluded, DES stent placed  . Syncope 07/12   bradycardic, beta blocker decreased, also felt to be dehydrated    Past Surgical History:  Procedure Laterality Date  . CARDIAC CATHETERIZATION  562130   dr. Onalee Hua harding, revealing a atretic bypass to her right with patent distal RCA stent  . CAROTID STENT     2012  . CORONARY ARTERY BYPASS GRAFT  2006   LIMA-LAD, free RIMA-PDA, Radial-OM1-OM2  .  DOPPLER ECHOCARDIOGRAPHY  [id number]   mild asymmetric left ventricular hypertrophy, left ventricular systolic function is low normal, ejection fraction = 50-55%, the LA is moderate dilated, the RA is mildly dilated, no significant valvular disease  . LEFT HEART CATHETERIZATION WITH CORONARY/GRAFT ANGIOGRAM N/A 10/12/2012   Procedure: LEFT HEART CATHETERIZATION WITH Isabel Caprice;  Surgeon: Marykay Lex, MD;  [facility];  Service: Cardiovascular;  Laterality: N/A;  . LOOP RECORDER IMPLANT N/A 10/15/2012   Procedure: LOOP RECORDER IMPLANT;  Surgeon: Thurmon Fair, MD;  Location: [facility] CATH LAB;  Service: Cardiovascular;  Laterality: N/A;  . NM MYOVIEW LTD  100510   post stress left ventricle is normal in size, post stress ejection fraction is 67% global left ventricular systolic function is normal, normal myocardial perfusion study, abnormal myocardial perfusion study, low risk scan    Current Outpatient Prescriptions  Medication Sig Dispense Refill  . acetaminophen (TYLENOL) 325 MG tablet Take 650 mg by mouth every 4 (four) hours as needed. For pain    . amLODipine (NORVASC) 5 MG tablet Take 5 mg by mouth daily.    [name] atorvastatin (LIPITOR) 80 MG tablet Take 80 mg by mouth daily.    . clobetasol cream (TEMOVATE) 0.05 % Apply 1 application topically as directed.  3  . ELIQUIS 5 MG TABS tablet TAKE 1 TABLET (5 MG TOTAL) BY MOUTH 2 (TWO) TIMES DAILY. 180 tablet 1  . furosemide (LASIX)  40 MG tablet Take 1 tablet (40 mg total) by mouth daily. 90 tablet 3  . levothyroxine (SYNTHROID, LEVOTHROID) 50 MCG tablet Take 50 mcg by mouth daily.    . nitroGLYCERIN (NITROSTAT) 0.4 MG SL tablet Place 0.4 mg under the tongue every 5 (five) minutes as needed. For chest pain    . traMADol (ULTRAM) 50 MG tablet Take 50 mg by mouth 2 (two) times daily.     . valsartan (DIOVAN) 320 MG tablet Take 1 tablet (320 mg total) by mouth daily. 90 tablet 3   No current facility-administered medications for  this visit.     Allergies:   Penicillins and Sulfa antibiotics   Social History   Social History  . Marital status: Single    Spouse name: N/A  . Number of children: N/A  . Years of education: N/A   Social History Main Topics  . Smoking status: Never Smoker  . Smokeless tobacco: Current User    Types: Snuff  . Alcohol use No  . Drug use: No  . Sexual activity: Not Asked   Other Topics Concern  . None   Social History Narrative  . None     Family History:  The patient's family history includes CAD in her maternal grandmother and mother.   ROS:   Please see the history of present illness.    Review of Systems  All other systems reviewed and are negative.    PHYSICAL EXAM:   VS:  BP 140/60   Pulse (!) 52   Ht  (1.6 m)   Wt 151 lb (68.5 kg)   BMI 26.75 kg/m     General: Alert, oriented x3, no distress, Well-nourished Head: no evidence of trauma, PERRL, EOMI, no exophtalmos or lid lag, no myxedema, no xanthelasma; normal ears, nose and oropharynx Neck: normal jugular venous pulsations and no hepatojugular reflux; brisk carotid pulses without delay and no carotid bruits Chest: clear to auscultation, no signs of consolidation by percussion or palpation, normal fremitus, symmetrical and full respiratory excursions Cardiovascular: normal position and quality of the apical impulse, irregular rhythm, normal first and second heart sounds, no murmurs, rubs or gallops Abdomen: no tenderness or distention, no masses by palpation, no abnormal pulsatility or arterial bruits, normal bowel sounds, no hepatosplenomegaly Extremities: no clubbing, cyanosis or edema; 2+ radial, ulnar and brachial pulses bilaterally; 2+ right femoral, posterior tibial and dorsalis pedis pulses; 2+ left femoral, posterior tibial and dorsalis pedis pulses; no subclavian or femoral bruits Neurological: grossly nonfocal Psych: Normal mood and affect   Wt Readings from Last 3 Encounters:  11/18/16 151  lb (68.5 kg)  07/24/16 149 lb 9.6 oz (67.9 kg)  05/17/16 152 lb (68.9 kg)      Studies/Labs Reviewed:   EKG:  EKG is ordered today.  It shows atrial fibrillation with spontaneously slow ventricular rate of 52 bpm, left ventricular hypertrophy with secondary repolarization abnormalities (QRS 104 ms), QTC 425 ms  Recent Labs: 01/08/2016: BUN 12; Creatinine, Ser 0.98; Hemoglobin 13.7; Platelets 183; Potassium 4.4; Sodium 140   Lipid Panel    Component Value Date/Time   CHOL 121 (L) 01/05/2015 1118   TRIG 89 01/05/2015 1118   HDL 47 01/05/2015 1118   CHOLHDL 2.6 01/05/2015 1118   VLDL 18 01/05/2015 1118   LDLCALC 56 01/05/2015 1118     ASSESSMENT:    1. Coronary artery disease involving coronary bypass graft of native heart with other forms of angina pectoris (HCC)   2. Persistent  atrial fibrillation (HCC)   3. Long term current use of anticoagulant   4. History of syncope   5. Essential hypertension   6. Dyslipidemia   7. Medication management      PLAN:  In order of problems listed above:  1. CAD - Asymptomatic. > 6 years since last revascularization procedure, about 3 years ago cath was OK. Atrial fibrillation with spontaneous slow ventricular response, asymptomatic. She is anticoagulated.  CHADSVasc 5 (age 60, hypertension, CAD, gender). Eliquis: Well-tolerated no bleeding complications No syncope after ILR implantation - suspect she had hypotension (nitrates and hypovolemia), not arrhythmic syncope. She does have evidence of conduction system disease and might eventually require pacemaker.  HBP well controlled. Avoid any vacations with negative chronotropic effects such as beta blockers ILR: Has reached end of service. She prefers to leave the device in for the time being. If she ever needs a pacemaker there would be a good opportunity to remove it HLP: Time for recheck. On statin.   Medication Adjustments/Labs and Tests Ordered: Current medicines are reviewed at  length with the patient today.  Concerns regarding medicines are outlined above.  Medication changes, Labs and Tests ordered today are listed below. Patient Instructions  Dr Royann Shivers recommends that you continue on your current medications as directed. Please refer to the Current Medication list given to you today.  Your physician recommends that you return for lab work at your earliest convenience - FASTING.  Your physician recommends that you schedule a follow-up appointment in 6 months with Dr Allyson Sabal.  Dr Royann Shivers recommends that you schedule a follow-up appointment in 12 months. You will receive a reminder letter in the mail two months in advance. If you don't receive a letter, please call our office to schedule the follow-up appointment.  If you need a refill on your cardiac medications before your next appointment, please call your pharmacy.   Signed, Thurmon Fair, MD  11/19/2016 12:57 PM    [facility] [address] [place], [place], [place]  [address] Phone: [phone]; Fax: [phone]

## 2016-11-25 LAB — COMPREHENSIVE METABOLIC PANEL
A/G RATIO: 1.9 (ref 1.2–2.2)
ALT: 16 IU/L (ref 0–32)
AST: 18 IU/L (ref 0–40)
Albumin: 4.2 g/dL (ref 3.2–4.6)
Alkaline Phosphatase: 84 IU/L (ref 39–117)
BUN/Creatinine Ratio: 13 (ref 12–28)
BUN: 15 mg/dL (ref 10–36)
Bilirubin Total: 0.7 mg/dL (ref 0.0–1.2)
CALCIUM: 10.1 mg/dL (ref 8.7–10.3)
CHLORIDE: 107 mmol/L — AB (ref 96–106)
CO2: 25 mmol/L (ref 20–29)
Creatinine, Ser: 1.13 mg/dL — ABNORMAL HIGH (ref 0.57–1.00)
GFR calc Af Amer: 49 mL/min/{1.73_m2} — ABNORMAL LOW (ref >59)
GFR, EST NON AFRICAN AMERICAN: 43 mL/min/{1.73_m2} — AB (ref >59)
GLUCOSE: 78 mg/dL (ref 65–99)
Globulin, Total: 2.2 g/dL (ref 1.5–4.5)
POTASSIUM: 4.2 mmol/L (ref 3.5–5.2)
Sodium: 144 mmol/L (ref 134–144)
Total Protein: 6.4 g/dL (ref 6.0–8.5)

## 2016-11-25 LAB — LIPID PANEL
CHOL/HDL RATIO: 2.8 ratio (ref 0.0–4.4)
Cholesterol, Total: 127 mg/dL (ref 100–199)
HDL: 46 mg/dL (ref >39)
LDL Calculated: 58 mg/dL (ref 0–99)
TRIGLYCERIDES: 117 mg/dL (ref 0–149)
VLDL CHOLESTEROL CAL: 23 mg/dL (ref 5–40)

## 2016-11-27 ENCOUNTER — Telehealth: Payer: Self-pay | Admitting: Cardiovascular Disease

## 2016-11-27 NOTE — Telephone Encounter (Signed)
New Message ° ° pt verbalized that she is returning call for rn °

## 2016-11-27 NOTE — Telephone Encounter (Signed)
Results of recent labwork and recommendations discussed with patient, who verbalized understanding and thanks.

## 2016-11-30 ENCOUNTER — Emergency Department (HOSPITAL_COMMUNITY): Payer: Medicare Other

## 2016-11-30 ENCOUNTER — Emergency Department (HOSPITAL_COMMUNITY)
Admission: EM | Admit: 2016-11-30 | Discharge: 2016-11-30 | Disposition: A | Discharge status: Home / Self Care | Payer: Medicare Other | Attending: Emergency Medicine | Admitting: Emergency Medicine

## 2016-11-30 ENCOUNTER — Encounter (HOSPITAL_COMMUNITY): Payer: Self-pay | Admitting: *Deleted

## 2016-11-30 DIAGNOSIS — R42 Dizziness and giddiness: Secondary | ICD-10-CM

## 2016-11-30 DIAGNOSIS — N183 Chronic kidney disease, stage 3 (moderate): Secondary | ICD-10-CM | POA: Diagnosis not present

## 2016-11-30 DIAGNOSIS — I252 Old myocardial infarction: Secondary | ICD-10-CM | POA: Insufficient documentation

## 2016-11-30 DIAGNOSIS — E785 Hyperlipidemia, unspecified: Secondary | ICD-10-CM | POA: Insufficient documentation

## 2016-11-30 DIAGNOSIS — Z79899 Other long term (current) drug therapy: Secondary | ICD-10-CM | POA: Insufficient documentation

## 2016-11-30 DIAGNOSIS — I2510 Atherosclerotic heart disease of native coronary artery without angina pectoris: Secondary | ICD-10-CM | POA: Diagnosis not present

## 2016-11-30 DIAGNOSIS — I480 Paroxysmal atrial fibrillation: Secondary | ICD-10-CM | POA: Diagnosis not present

## 2016-11-30 DIAGNOSIS — Z951 Presence of aortocoronary bypass graft: Secondary | ICD-10-CM | POA: Diagnosis not present

## 2016-11-30 DIAGNOSIS — I129 Hypertensive chronic kidney disease with stage 1 through stage 4 chronic kidney disease, or unspecified chronic kidney disease: Secondary | ICD-10-CM | POA: Diagnosis not present

## 2016-11-30 DIAGNOSIS — Z7901 Long term (current) use of anticoagulants: Secondary | ICD-10-CM | POA: Insufficient documentation

## 2016-11-30 LAB — COMPREHENSIVE METABOLIC PANEL
ALK PHOS: 69 U/L (ref 38–126)
ALT: 21 U/L (ref 14–54)
AST: 25 U/L (ref 15–41)
Albumin: 3.8 g/dL (ref 3.5–5.0)
Anion gap: 7 (ref 5–15)
BILIRUBIN TOTAL: 1 mg/dL (ref 0.3–1.2)
BUN: 21 mg/dL — AB (ref 6–20)
CALCIUM: 10.1 mg/dL (ref 8.9–10.3)
CHLORIDE: 111 mmol/L (ref 101–111)
CO2: 22 mmol/L (ref 22–32)
CREATININE: 1.06 mg/dL — AB (ref 0.44–1.00)
GFR, EST AFRICAN AMERICAN: 52 mL/min — AB (ref >60)
GFR, EST NON AFRICAN AMERICAN: 45 mL/min — AB (ref >60)
Glucose, Bld: 87 mg/dL (ref 65–99)
Potassium: 4.1 mmol/L (ref 3.5–5.1)
Sodium: 140 mmol/L (ref 135–145)
TOTAL PROTEIN: 6.5 g/dL (ref 6.5–8.1)

## 2016-11-30 LAB — CBC WITH DIFFERENTIAL/PLATELET
BASOS ABS: 0 10*3/uL (ref 0.0–0.1)
BASOS PCT: 0 %
EOS PCT: 1 %
Eosinophils Absolute: 0.1 10*3/uL (ref 0.0–0.7)
HCT: 41.9 % (ref 36.0–46.0)
Hemoglobin: 13.2 g/dL (ref 12.0–15.0)
LYMPHS PCT: 19 %
Lymphs Abs: 1.1 10*3/uL (ref 0.7–4.0)
MCH: 27.2 pg (ref 26.0–34.0)
MCHC: 31.5 g/dL (ref 30.0–36.0)
MCV: 86.2 fL (ref 78.0–100.0)
Monocytes Absolute: 0.5 10*3/uL (ref 0.1–1.0)
Monocytes Relative: 8 %
Neutro Abs: 4.1 10*3/uL (ref 1.7–7.7)
Neutrophils Relative %: 72 %
PLATELETS: 174 10*3/uL (ref 150–400)
RBC: 4.86 MIL/uL (ref 3.87–5.11)
RDW: 13.9 % (ref 11.5–15.5)
WBC: 5.7 10*3/uL (ref 4.0–10.5)

## 2016-11-30 MED ORDER — MECLIZINE HCL 25 MG PO TABS: 25.0000 mg | ORAL_TABLET | Freq: Three times a day (TID) | ORAL | 0 refills | Status: DC | PRN

## 2016-11-30 MED ORDER — MECLIZINE HCL 25 MG PO TABS
25.0000 mg | ORAL_TABLET | Freq: Once | ORAL | Status: AC
  Administered 2016-11-30: 25 mg via ORAL
  Filled 2016-11-30: qty 1

## 2016-11-30 NOTE — ED Notes (Signed)
Patient returned from CT

## 2016-11-30 NOTE — ED Notes (Addendum)
Patient transported to MRI 

## 2016-11-30 NOTE — ED Notes (Signed)
Patient ambulated in hallway, tolerated well 

## 2016-11-30 NOTE — Discharge Instructions (Signed)
Continue the anti-GERD for the dizziness. Return for any new or worse symptoms. Make an appointment to follow-up with your doctor in the next few days. Referral information provided to neurology if additional follow-up is needed.

## 2016-11-30 NOTE — ED Notes (Signed)
Family at bedside. 

## 2016-11-30 NOTE — ED Triage Notes (Signed)
PT here via [facility] for dizziness when waking up this am.   Initial bp was 220/106, hr afib in 60's (though pt denies hx of afib).  Pt ao x 4 per ems.

## 2016-11-30 NOTE — ED Notes (Signed)
Daughter [name]) called to get an update on arrival. Pt notified.

## 2016-11-30 NOTE — ED Notes (Signed)
Tech walking patient at this time.

## 2016-11-30 NOTE — ED Notes (Signed)
Called daughter concerning discharge orders. Daughter will arrange for a ride home asap.

## 2016-11-30 NOTE — ED Notes (Addendum)
Patient transported to CT 

## 2016-11-30 NOTE — ED Provider Notes (Signed)
[facility]-EMERGENCY DEPT Provider Note   CSN: [id number] Arrival date & time: 11/30/16  7829     History   Chief Complaint Chief Complaint  Patient presents with  . Dizziness  . Hypertension    HPI Stephanie Atkinson is a 83 y.o. female.  Patient brought in by EMS for acute onset of dizziness with room spinning. Patient's blood pressure was elevated as well 180/58. Patient without any other specific symptoms. No other focal neurological deficits. Everything hit her this morning. Patient's past medical history significant for hypertension. Proximal atrial fibrillation as well. Patient is on blood thinners. Patient also has a implanted cardiac loop detector. Patient denies any chest pain any shortness of breath. No new visual changes no new motor weakness. No headache. Patient lives in assisted living. Family members live near her.      Past Medical History:  Diagnosis Date  . Chronic kidney disease, stage 3 (HCC)   . Coronary artery disease   . Hx of CABG 2006   LIMA-LAD, free RIMA-PDA, Radial-OM1-OM2  . Hyperlipidemia   . Hypertension   . Hypothyroid   . MI, old       . PAF (paroxysmal atrial fibrillation) (HCC)    was on amio, d/c'd due to prolonged PR interval, rate control  . Presence of stent in right coronary artery 07/12   RIMA-PDA occluded, DES stent placed  . Syncope 07/12   bradycardic, beta blocker decreased, also felt to be dehydrated    Patient Active Problem List   Diagnosis Date Noted  . Chronic pain of right knee 05/17/2016  . De Quervain's tenosynovitis 05/17/2016  . CAD (coronary artery disease) of artery bypass graft 02/13/2015  . Encounter for loop recorder check 02/01/2014  . Accelerated junctional rhythm 05/04/2013  . Dehydration 10/14/2012  . Syncope and collapse 10/13/2012  . History of first degree atrioventricular block 10/11/2012  . Chronic anticoagulation- Eloquis started and Plavix stopped 10/07/12 10/06/2012  . AVB - beta blocker and Amiodarone  stopped 07/10/2012  . Hypertension   . Hx of CABG X 4 3/06-    . Dyslipidemia   . RCA DES placed 7/12- patent 8/14   . Permanent atrial fibrillation (HCC)   . Chronic kidney disease, stage 3 (HCC)     Past Surgical History:  Procedure Laterality Date  . CARDIAC CATHETERIZATION  562130   dr. Onalee Hua harding, revealing a atretic bypass to her right with patent distal RCA stent  . CAROTID STENT     2012  . CORONARY ARTERY BYPASS GRAFT  2006   LIMA-LAD, free RIMA-PDA, Radial-OM1-OM2  . DOPPLER ECHOCARDIOGRAPHY  [id number]   mild asymmetric left ventricular hypertrophy, left ventricular systolic function is low normal, ejection fraction = 50-55%, the LA is moderate dilated, the RA is mildly dilated, no significant valvular disease  . JOINT REPLACEMENT     hip replacement  . LEFT HEART CATHETERIZATION WITH CORONARY/GRAFT ANGIOGRAM N/A 10/12/2012   Procedure: LEFT HEART CATHETERIZATION WITH Isabel Caprice;  Surgeon: Marykay Lex, MD;  Location: [facility] CATH LAB;  Service: Cardiovascular;  Laterality: N/A;  . LOOP RECORDER IMPLANT N/A 10/15/2012   Procedure: LOOP RECORDER IMPLANT;  Surgeon: Thurmon Fair, MD;  Location: [facility] CATH LAB;  Service: Cardiovascular;  Laterality: N/A;  . NM MYOVIEW LTD  100510   post stress left ventricle is normal in size, post stress ejection fraction is 67% global left ventricular systolic function is normal, normal myocardial perfusion study, abnormal myocardial perfusion study, low risk scan  . THYROIDECTOMY  OB History    No data available       Home Medications    Prior to Admission medications   Medication Sig Start Date End Date Taking? Authorizing Provider  acetaminophen (TYLENOL) 325 MG tablet Take 650 mg by mouth every 4 (four) hours as needed. For pain   Yes [provider]  amLODipine (NORVASC) 5 MG tablet Take 5 mg by mouth daily. 06/22/12  Yes [provider]  atorvastatin (LIPITOR) 80 MG tablet Take 80 mg by mouth  daily.   Yes [provider]  clobetasol cream (TEMOVATE) 0.05 % Apply 1 application topically as needed (to legs).  07/12/16  Yes [provider]  ELIQUIS 5 MG TABS tablet TAKE 1 TABLET (5 MG TOTAL) BY MOUTH 2 (TWO) TIMES DAILY. 07/25/16  Yes Runell Gess, MD  furosemide (LASIX) 40 MG tablet Take 1 tablet (40 mg total) by mouth daily. 11/09/14  Yes Barrett, Joline Salt, PA-C  levothyroxine (SYNTHROID, LEVOTHROID) 50 MCG tablet Take 50 mcg by mouth daily.   Yes [provider]  nitroGLYCERIN (NITROSTAT) 0.4 MG SL tablet Place 0.4 mg under the tongue every 5 (five) minutes as needed. For chest pain   Yes [provider]  traMADol (ULTRAM) 50 MG tablet Take 50 mg by mouth 2 (two) times daily.    Yes [provider]  valsartan (DIOVAN) 320 MG tablet Take 1 tablet (320 mg total) by mouth daily. 07/29/16  Yes Runell Gess, MD  meclizine (ANTIVERT) 25 MG tablet Take 1 tablet (25 mg total) by mouth 3 (three) times daily as needed for dizziness. 11/30/16   Vanetta Mulders, MD    Family History Family History  Problem Relation Age of Onset  . CAD Mother   . CAD Maternal Grandmother     Social History Social History  Substance Use Topics  . Smoking status: Never Smoker  . Smokeless tobacco: Current User    Types: Snuff  . Alcohol use No     Allergies   Penicillins and Sulfa antibiotics   Review of Systems Review of Systems  Constitutional: Negative for fever.  HENT: Negative for congestion.   Eyes: Negative for visual disturbance.  Respiratory: Negative for shortness of breath.   Gastrointestinal: Positive for nausea. Negative for abdominal pain and vomiting.  Genitourinary: Negative for dysuria.  Musculoskeletal: Negative for back pain.  Skin: Negative for wound.  Neurological: Positive for dizziness.  Hematological: Bruises/bleeds easily.  Psychiatric/Behavioral: Negative for confusion.     Physical Exam Updated Vital Signs BP  (!) 182/74 (BP Location: Right Arm)   Pulse 61   Temp 98.2 F (36.8 C) (Oral)   Resp 18   SpO2 100%   Physical Exam  Constitutional: She is oriented to person, place, and time. She appears well-developed and well-nourished. No distress.  HENT:  Head: Normocephalic and atraumatic.  Mouth/Throat: Oropharynx is clear and moist.  Eyes: Pupils are equal, round, and reactive to light. Conjunctivae and EOM are normal.  Neck: Normal range of motion. Neck supple.  Cardiovascular: Normal rate.   Pulmonary/Chest: Effort normal. No respiratory distress.  Abdominal: Soft. There is no tenderness.  Musculoskeletal: Normal range of motion.  Neurological: She is alert and oriented to person, place, and time. No cranial nerve deficit or sensory deficit. She exhibits normal muscle tone. Coordination normal.  Skin: Skin is warm.  Nursing note and vitals reviewed.    ED Treatments / Results  Labs (all labs ordered are listed, but only abnormal results  are displayed) Labs Reviewed  COMPREHENSIVE METABOLIC PANEL - Abnormal; Notable for the following:       Result Value   BUN 21 (*)    Creatinine, Ser 1.06 (*)    GFR calc non Af Amer 45 (*)    GFR calc Af Amer 52 (*)    All other components within normal limits  CBC WITH DIFFERENTIAL/PLATELET    EKG  EKG Interpretation  Date/Time:  Saturday November 30 2016 08:39:50 EDT Ventricular Rate:  47 PR Interval:    QRS Duration: 96 QT Interval:  509 QTC Calculation: 450 R Axis:   62 Text Interpretation:  Atrial fibrillation Ventricular premature complex LVH with secondary repolarization abnormality Anterior Q waves, possibly due to LVH Artifact Confirmed by Vanetta Mulders [phone]) on 11/30/2016 8:57:07 AM       Radiology Ct Head Wo Contrast  Result Date: 11/30/2016 CLINICAL DATA:  Pt c/o having an episode this morning, she set up on the side of her bed and it looked like the walls were closing in on her. EXAM: CT HEAD WITHOUT CONTRAST  TECHNIQUE: Contiguous axial images were obtained from the base of the skull through the vertex without intravenous contrast. COMPARISON:  Head CT dated 12/20/2015. FINDINGS: Brain: Mild generalized age related parenchymal atrophy with commensurate dilatation of the ventricles and sulci. Chronic small vessel ischemic changes again noted within the bilateral periventricular and subcortical white matter regions. Old lacunar infarct again noted in the left thalamus. No mass, hemorrhage, edema or other evidence of acute parenchymal abnormality. No extra-axial hemorrhage. Vascular: There are chronic calcified atherosclerotic changes of the large vessels at the skull base. No unexpected hyperdense vessel. Skull: Normal. Negative for fracture or focal lesion. Sinuses/Orbits: No acute finding. Other: None. IMPRESSION: 1. No acute findings.  No intracranial mass, hemorrhage or edema. 2. Chronic small vessel ischemic changes, as detailed above. Electronically Signed   By: Bary Richard M.D.   On: 11/30/2016 11:24   Mr Brain Wo Contrast (neuro Protocol)  Result Date: 11/30/2016 CLINICAL DATA:  Ataxia.  Acute stroke.  Negative CT. EXAM: MRI HEAD WITHOUT CONTRAST TECHNIQUE: Multiplanar, multiecho pulse sequences of the brain and surrounding structures were obtained without intravenous contrast. COMPARISON:  CT same day. FINDINGS: Brain: Diffusion imaging does not show any acute or subacute infarction. The brainstem is normal. There is an old infarction in the right cerebellum. Cerebral hemispheres show an old lacunar infarction in the left thalamus and moderate chronic small-vessel ischemic changes of the deep and subcortical white matter. No mass lesion, hemorrhage, hydrocephalus or extra-axial collection. Vascular: Major vessels at the base of the brain show flow. Skull and upper cervical spine: Negative Sinuses/Orbits: Clear/normal Other: None significant IMPRESSION: No acute finding by MRI. Old right cerebellar  infarction. Old left thalamic lacunar infarction. Moderate chronic small-vessel ischemic changes of the hemispheric white matter. Electronically Signed   By: Paulina Fusi M.D.   On: 11/30/2016 13:12   Dg Chest Port 1 View  Result Date: 11/30/2016 CLINICAL DATA:  Dizziness, chest pain dizziness, mid chest pain EXAM: PORTABLE CHEST 1 VIEW COMPARISON:  01/08/2016 FINDINGS: Bilateral mild interstitial thickening. No pleural effusion or pneumothorax. Stable cardiomegaly. Prior CABG. No acute osseous abnormality. Moderate osteoarthritis of bilateral glenohumeral joints. IMPRESSION: 1. Cardiomegaly with mild pulmonary vascular congestion. Electronically Signed   By: Elige Ko   On: 11/30/2016 10:36    Procedures Procedures (including critical care time)  Medications Ordered in ED Medications  meclizine (ANTIVERT) tablet 25 mg (25 mg Oral Given  11/30/16 1050)     Initial Impression / Assessment and Plan / ED Course  I have reviewed the triage vital signs and the nursing notes.  Pertinent labs & imaging results that were available during my care of the patient were reviewed by me and considered in my medical decision making (see chart for details).     Extensive workup for the vertigo to include head CT as well as MRI brain. MRI brain shows evidence of some old remote small infarcts but nothing acute. Patient responded extremely well to meclizine. But to be honest the dizziness was beginning to improve prior to receiving that. Symptoms were predominantly brought on by sitting up or standing up. With patient laying flat symptoms did not occur. Patient is now asymptomatic. Patient is stable for discharge home.  Patient will be continued on the meclizine. Will have follow-up with primary care doctor as well as neurology as needed.  Final Clinical Impressions(s) / ED Diagnoses   Final diagnoses:  Vertigo    New Prescriptions New Prescriptions   MECLIZINE (ANTIVERT) 25 MG TABLET    Take 1  tablet (25 mg total) by mouth 3 (three) times daily as needed for dizziness.     Vanetta Mulders, MD 11/30/16 1536

## 2016-11-30 NOTE — ED Notes (Signed)
Patient returned from MRI.

## 2016-12-04 ENCOUNTER — Ambulatory Visit (INDEPENDENT_AMBULATORY_CARE_PROVIDER_SITE_OTHER): Payer: Medicare Other | Admitting: Orthopaedic Surgery

## 2016-12-18 ENCOUNTER — Telehealth (INDEPENDENT_AMBULATORY_CARE_PROVIDER_SITE_OTHER): Payer: Self-pay | Admitting: Orthopaedic Surgery

## 2016-12-18 NOTE — Telephone Encounter (Signed)
Please let Ms.Arita MissDawson know if an appt opens up sooner than Nov 14 @9 :45am

## 2016-12-19 NOTE — Telephone Encounter (Signed)
I called patient and offered an appointment for tomorrow afternoon that opened at 3:30p. She uses [employer] transportation and has to let them know a day in advance. She is unable to make that appt. I advised I would let her know if I had anything else that seemed to come open.

## 2016-12-20 NOTE — Telephone Encounter (Signed)
I called patient. Worked in for 12/23/2016 at 1 pm.

## 2016-12-23 ENCOUNTER — Ambulatory Visit (INDEPENDENT_AMBULATORY_CARE_PROVIDER_SITE_OTHER): Payer: Medicare Other | Admitting: Orthopaedic Surgery

## 2016-12-26 ENCOUNTER — Emergency Department (HOSPITAL_COMMUNITY): Payer: Medicare Other

## 2016-12-26 ENCOUNTER — Encounter (HOSPITAL_COMMUNITY): Payer: Self-pay | Admitting: *Deleted

## 2016-12-26 ENCOUNTER — Emergency Department (HOSPITAL_COMMUNITY)
Admission: EM | Admit: 2016-12-26 | Discharge: 2016-12-26 | Disposition: A | Discharge status: Home / Self Care | Payer: Medicare Other | Attending: Emergency Medicine | Admitting: Emergency Medicine

## 2016-12-26 DIAGNOSIS — Z79899 Other long term (current) drug therapy: Secondary | ICD-10-CM | POA: Diagnosis not present

## 2016-12-26 DIAGNOSIS — Z951 Presence of aortocoronary bypass graft: Secondary | ICD-10-CM | POA: Insufficient documentation

## 2016-12-26 DIAGNOSIS — Z96649 Presence of unspecified artificial hip joint: Secondary | ICD-10-CM | POA: Insufficient documentation

## 2016-12-26 DIAGNOSIS — I129 Hypertensive chronic kidney disease with stage 1 through stage 4 chronic kidney disease, or unspecified chronic kidney disease: Secondary | ICD-10-CM | POA: Insufficient documentation

## 2016-12-26 DIAGNOSIS — W010XXA Fall on same level from slipping, tripping and stumbling without subsequent striking against object, initial encounter: Secondary | ICD-10-CM | POA: Insufficient documentation

## 2016-12-26 DIAGNOSIS — R001 Bradycardia, unspecified: Secondary | ICD-10-CM | POA: Insufficient documentation

## 2016-12-26 DIAGNOSIS — Z7901 Long term (current) use of anticoagulants: Secondary | ICD-10-CM | POA: Diagnosis not present

## 2016-12-26 DIAGNOSIS — Y999 Unspecified external cause status: Secondary | ICD-10-CM | POA: Diagnosis not present

## 2016-12-26 DIAGNOSIS — M62838 Other muscle spasm: Secondary | ICD-10-CM

## 2016-12-26 DIAGNOSIS — S0990XA Unspecified injury of head, initial encounter: Secondary | ICD-10-CM

## 2016-12-26 DIAGNOSIS — E039 Hypothyroidism, unspecified: Secondary | ICD-10-CM | POA: Diagnosis not present

## 2016-12-26 DIAGNOSIS — Y9389 Activity, other specified: Secondary | ICD-10-CM | POA: Insufficient documentation

## 2016-12-26 DIAGNOSIS — M47812 Spondylosis without myelopathy or radiculopathy, cervical region: Secondary | ICD-10-CM | POA: Insufficient documentation

## 2016-12-26 DIAGNOSIS — N183 Chronic kidney disease, stage 3 (moderate): Secondary | ICD-10-CM | POA: Insufficient documentation

## 2016-12-26 DIAGNOSIS — S0003XA Contusion of scalp, initial encounter: Secondary | ICD-10-CM | POA: Diagnosis not present

## 2016-12-26 DIAGNOSIS — W19XXXA Unspecified fall, initial encounter: Secondary | ICD-10-CM

## 2016-12-26 DIAGNOSIS — I2510 Atherosclerotic heart disease of native coronary artery without angina pectoris: Secondary | ICD-10-CM | POA: Insufficient documentation

## 2016-12-26 DIAGNOSIS — Y929 Unspecified place or not applicable: Secondary | ICD-10-CM | POA: Diagnosis not present

## 2016-12-26 NOTE — ED Notes (Signed)
Pt sitting on the side of the bed drinking coffee and eating crackers.  Denies dizziness.

## 2016-12-26 NOTE — ED Provider Notes (Signed)
[facility] EMERGENCY DEPARTMENT Provider Note   CSN: [id number] Arrival date & time: 12/26/16  1053     History   Chief Complaint Chief Complaint  Patient presents with  . Fall    HPI Stephanie Atkinson is a 83 y.o. female with a PMHx of CKD3, CAD s/p CABG, HTN, HLD, PAF on Eliquis, hypothyroidism, bradycardia, syncope, AV block, and other conditions listed below, who presents to the ED with complaints of mechanical fall that occurred just prior to arrival. Patient was walking out of her sliding glass door when she turned to grab her shopping cart behind her and her foot caught on the ground causing her to fall and hit the left posterior scalp on the wall next to her, and then slide down to the ground. She denies any prodromal symptoms including dizziness or lightheadedness, states that she felt fine and that her foot just caught on the ground. She denies any syncope/LOC. She was able to get up with the assistance of the paramedics, and was ambulatory on scene. She states that she has a small bump on the left posterior scalp however she denies any pain in this area. She denies any pain from the fall, denies any injuries sustained aside from the swollen part of her scalp. However she states that this morning when she woke up she had some right neck pain that she thought was just from "sleeping wrong on it", and feels very much like her arthritis. She denies any worsening of his neck pain after fall. She describes the pain is 8/10 intermittent aching nonradiating right lateral neck pain that worsens with turning her neck, and with no treatments tried prior to arrival. She is compliant on her Oquist. Her PCP is Dr. Ronne Binning at [facility]. She denies any hip pain or injury, back or buttock injury, leg pain, extremity pain, headache, vision changes, lightheadedness, dizziness, syncope/LOC, lacerations, bruises, recent fevers or chills, CP, SOB, abdominal pain, nausea, vomiting, diarrhea,  constipation, dysuria, hematuria, numbness, tingling, focal weakness, or any other complaints at this time. Last week she was on an antibiotic for a UTI, however she has completed this course and has had no other changes in her medications recently.   The history is provided by the patient and medical records. No language interpreter was used.  Fall  This is a new problem. The current episode started less than 1 hour ago. The problem occurs every several days. The problem has not changed since onset.Pertinent negatives include no chest pain, no abdominal pain, no headaches and no shortness of breath. The symptoms are aggravated by walking. Nothing relieves the symptoms. She has tried nothing for the symptoms. The treatment provided no relief.    Past Medical History:  Diagnosis Date  . Chronic kidney disease, stage 3 (HCC)   . Coronary artery disease   . Hx of CABG 2006   LIMA-LAD, free RIMA-PDA, Radial-OM1-OM2  . Hyperlipidemia   . Hypertension   . Hypothyroid   . MI, old       . PAF (paroxysmal atrial fibrillation) (HCC)    was on amio, d/c'd due to prolonged PR interval, rate control  . Presence of stent in right coronary artery 07/12   RIMA-PDA occluded, DES stent placed  . Syncope 07/12   bradycardic, beta blocker decreased, also felt to be dehydrated    Patient Active Problem List   Diagnosis Date Noted  . Chronic pain of right knee 05/17/2016  . De Quervain's tenosynovitis 05/17/2016  .  CAD (coronary artery disease) of artery bypass graft 02/13/2015  . Encounter for loop recorder check 02/01/2014  . Accelerated junctional rhythm 05/04/2013  . Dehydration 10/14/2012  . Syncope and collapse 10/13/2012  . History of first degree atrioventricular block 10/11/2012  . Chronic anticoagulation- Eloquis started and Plavix stopped 10/07/12 10/06/2012  . AVB - beta blocker and Amiodarone stopped 07/10/2012  . Hypertension   . Hx of CABG X 4 3/06-    . Dyslipidemia   . RCA DES  placed 7/12- patent 8/14   . Permanent atrial fibrillation (HCC)   . Chronic kidney disease, stage 3 (HCC)     Past Surgical History:  Procedure Laterality Date  . CARDIAC CATHETERIZATION  161096   dr. Onalee Hua harding, revealing a atretic bypass to her right with patent distal RCA stent  . CAROTID STENT     2012  . CORONARY ARTERY BYPASS GRAFT  2006   LIMA-LAD, free RIMA-PDA, Radial-OM1-OM2  . DOPPLER ECHOCARDIOGRAPHY  [id number]   mild asymmetric left ventricular hypertrophy, left ventricular systolic function is low normal, ejection fraction = 50-55%, the LA is moderate dilated, the RA is mildly dilated, no significant valvular disease  . JOINT REPLACEMENT     hip replacement  . LEFT HEART CATHETERIZATION WITH CORONARY/GRAFT ANGIOGRAM N/A 10/12/2012   Procedure: LEFT HEART CATHETERIZATION WITH Isabel Caprice;  Surgeon: Marykay Lex, MD;  Location: [facility] CATH LAB;  Service: Cardiovascular;  Laterality: N/A;  . LOOP RECORDER IMPLANT N/A 10/15/2012   Procedure: LOOP RECORDER IMPLANT;  Surgeon: Thurmon Fair, MD;  Location: [facility] CATH LAB;  Service: Cardiovascular;  Laterality: N/A;  . NM MYOVIEW LTD  100510   post stress left ventricle is normal in size, post stress ejection fraction is 67% global left ventricular systolic function is normal, normal myocardial perfusion study, abnormal myocardial perfusion study, low risk scan  . THYROIDECTOMY      OB History    No data available       Home Medications    Prior to Admission medications   Medication Sig Start Date End Date Taking? Authorizing Provider  acetaminophen (TYLENOL) 325 MG tablet Take 650 mg by mouth every 4 (four) hours as needed. For pain    [provider]  amLODipine (NORVASC) 5 MG tablet Take 5 mg by mouth daily. 06/22/12   [provider]  atorvastatin (LIPITOR) 80 MG tablet Take 80 mg by mouth daily.    [provider]  clobetasol cream (TEMOVATE) 0.05 % Apply 1 application topically as  needed (to legs).  07/12/16   [provider]  ELIQUIS 5 MG TABS tablet TAKE 1 TABLET (5 MG TOTAL) BY MOUTH 2 (TWO) TIMES DAILY. 07/25/16   Runell Gess, MD  furosemide (LASIX) 40 MG tablet Take 1 tablet (40 mg total) by mouth daily. 11/09/14   Barrett, Joline Salt, PA-C  levothyroxine (SYNTHROID, LEVOTHROID) 50 MCG tablet Take 50 mcg by mouth daily.    [provider]  meclizine (ANTIVERT) 25 MG tablet Take 1 tablet (25 mg total) by mouth 3 (three) times daily as needed for dizziness. 11/30/16   Vanetta Mulders, MD  nitroGLYCERIN (NITROSTAT) 0.4 MG SL tablet Place 0.4 mg under the tongue every 5 (five) minutes as needed. For chest pain    [provider]  traMADol (ULTRAM) 50 MG tablet Take 50 mg by mouth 2 (two) times daily.     [provider]  valsartan (DIOVAN) 320 MG tablet Take 1 tablet (320 mg total) by mouth  daily. 07/29/16   Runell Gess, MD    Family History Family History  Problem Relation Age of Onset  . CAD Mother   . CAD Maternal Grandmother     Social History Social History  Substance Use Topics  . Smoking status: Never Smoker  . Smokeless tobacco: Current User    Types: Snuff  . Alcohol use No     Allergies   Penicillins and Sulfa antibiotics   Review of Systems Review of Systems  Constitutional: Negative for chills and fever.  HENT: Positive for facial swelling (scalp).   Eyes: Negative for visual disturbance.  Respiratory: Negative for shortness of breath.   Cardiovascular: Negative for chest pain.  Gastrointestinal: Negative for abdominal pain, constipation, diarrhea, nausea and vomiting.  Genitourinary: Negative for dysuria and hematuria.  Musculoskeletal: Positive for neck pain. Negative for arthralgias, back pain and myalgias.  Skin: Negative for color change and wound.  Allergic/Immunologic: Negative for immunocompromised state.  Neurological: Negative for syncope, weakness, light-headedness, numbness and  headaches.  Hematological: Bruises/bleeds easily (on eliquis).  Psychiatric/Behavioral: Negative for confusion.   All other systems reviewed and are negative for acute change except as noted in the HPI.    Physical Exam Updated Vital Signs BP (!) 154/62 (BP Location: Left Arm)   Pulse (!) 44   Temp 98.6 F (37 C) (Oral)   Resp 16   SpO2 96%   Physical Exam  Constitutional: She is oriented to person, place, and time. Vital signs are normal. She appears well-developed and well-nourished.  Non-toxic appearance. No distress.  Afebrile, nontoxic, NAD  HENT:  Head: Normocephalic. Head is with contusion. Head is without raccoon's eyes, without Battle's sign and without abrasion.    Right Ear: Hearing, tympanic membrane, external ear and ear canal normal.  Left Ear: Hearing, tympanic membrane, external ear and ear canal normal.  Nose: Nose normal.  Mouth/Throat: Oropharynx is clear and moist and mucous membranes are normal.  Small hematoma to L posterior scalp, very mildly TTP, no bony stepoffs or deformities, no crepitus, no other scalp/face tenderness. No racoon eyes or battle's sign, no abrasions or lacerations. Ears are clear bilaterally, no hemotympanum. Nose clear. Oropharynx clear. Tongue protrusion symmetric. No s/sx of basilar skull fx.   Eyes: Pupils are equal, round, and reactive to light. Conjunctivae and EOM are normal. Right eye exhibits no discharge. Left eye exhibits no discharge.  PERRL, EOMI, no nystagmus, no visual field deficits   Neck: Normal range of motion. Neck supple. Muscular tenderness present. No spinous process tenderness present. No neck rigidity. Normal range of motion present.  Towel wrapped around C-spine loosely. FROM intact without spinous process TTP, no bony stepoffs or deformities, with mild R sided paraspinous muscle TTP and muscle spasms. No rigidity or meningeal signs. No bruising or swelling.   Cardiovascular: Regular rhythm, normal heart sounds and  intact distal pulses.  Bradycardia present.  Exam reveals no gallop and no friction rub.   No murmur heard. Bradycardia which appears to be chronic and unchanged  Pulmonary/Chest: Effort normal and breath sounds normal. No respiratory distress. She has no decreased breath sounds. She has no wheezes. She has no rhonchi. She has no rales.  Abdominal: Soft. Normal appearance and bowel sounds are normal. She exhibits no distension. There is no tenderness. There is no rigidity, no rebound, no guarding, no CVA tenderness, no tenderness at McBurney's point and negative Murphy's sign.  Musculoskeletal: Normal range of motion.  MAE x4 Strength and sensation grossly intact in  all extremities Distal pulses intact C-spine as above, all other spinal levels nonTTP without bony stepoffs or deformities No pelvic instability.  No tenderness to all extremities and all other major joints. Neg log roll testing.   Neurological: She is alert and oriented to person, place, and time. She has normal strength. No cranial nerve deficit or sensory deficit. Coordination and gait normal. GCS eye subscore is 4. GCS verbal subscore is 5. GCS motor subscore is 6.  CN 2-12 grossly intact A&O x4 GCS 15 Sensation and strength intact Gait nonataxic and steady Coordination with finger-to-nose WNL Neg pronator drift   Skin: Skin is warm, dry and intact. No abrasion, no bruising and no rash noted.  No bruising or abrasions noted  Psychiatric: She has a normal mood and affect.  Nursing note and vitals reviewed.    ED Treatments / Results  Labs (all labs ordered are listed, but only abnormal results are displayed) Labs Reviewed - No data to display  EKG  EKG Interpretation None       Radiology Ct Head Wo Contrast  Result Date: 12/26/2016 CLINICAL DATA:  Fall, hit back of head.  Right neck pain. EXAM: CT HEAD WITHOUT CONTRAST CT CERVICAL SPINE WITHOUT CONTRAST TECHNIQUE: Multidetector CT imaging of the head and  cervical spine was performed following the standard protocol without intravenous contrast. Multiplanar CT image reconstructions of the cervical spine were also generated. COMPARISON:  11/30/2016 FINDINGS: CT HEAD FINDINGS Brain: Old right cerebellar infarct. Old lacunar infarct in the left thalamus. Mild chronic small vessel disease throughout the deep white matter. No acute intracranial abnormality. Specifically, no hemorrhage, hydrocephalus, mass lesion, acute infarction, or significant intracranial injury. Vascular: No hyperdense vessel or unexpected calcification. Skull: No acute calvarial abnormality. Sinuses/Orbits: Visualized paranasal sinuses and mastoids clear. Orbital soft tissues unremarkable. Other: None CT CERVICAL SPINE FINDINGS Alignment: Slight anterolisthesis of C4 on C5, C5 on C6 and C6 on C7 related to facet disease. Skull base and vertebrae: No fracture. Soft tissues and spinal canal: Prevertebral soft tissues are normal. No epidural or paraspinal hematoma. Disc levels: Degenerative changes at the C1-2 articulation and diffusely throughout the facets bilaterally. Upper chest: Negative Other: None IMPRESSION: Old infarcts in the right cerebellum and left thalamus. Chronic small vessel disease. No acute intracranial abnormality. Degenerative changes in the cervical spine. No acute bony abnormality. Electronically Signed   By: Charlett Nose M.D.   On: 12/26/2016 13:30   Ct Cervical Spine Wo Contrast  Result Date: 12/26/2016 CLINICAL DATA:  Fall, hit back of head.  Right neck pain. EXAM: CT HEAD WITHOUT CONTRAST CT CERVICAL SPINE WITHOUT CONTRAST TECHNIQUE: Multidetector CT imaging of the head and cervical spine was performed following the standard protocol without intravenous contrast. Multiplanar CT image reconstructions of the cervical spine were also generated. COMPARISON:  11/30/2016 FINDINGS: CT HEAD FINDINGS Brain: Old right cerebellar infarct. Old lacunar infarct in the left thalamus. Mild  chronic small vessel disease throughout the deep white matter. No acute intracranial abnormality. Specifically, no hemorrhage, hydrocephalus, mass lesion, acute infarction, or significant intracranial injury. Vascular: No hyperdense vessel or unexpected calcification. Skull: No acute calvarial abnormality. Sinuses/Orbits: Visualized paranasal sinuses and mastoids clear. Orbital soft tissues unremarkable. Other: None CT CERVICAL SPINE FINDINGS Alignment: Slight anterolisthesis of C4 on C5, C5 on C6 and C6 on C7 related to facet disease. Skull base and vertebrae: No fracture. Soft tissues and spinal canal: Prevertebral soft tissues are normal. No epidural or paraspinal hematoma. Disc levels: Degenerative changes at the C1-2  articulation and diffusely throughout the facets bilaterally. Upper chest: Negative Other: None IMPRESSION: Old infarcts in the right cerebellum and left thalamus. Chronic small vessel disease. No acute intracranial abnormality. Degenerative changes in the cervical spine. No acute bony abnormality. Electronically Signed   By: Charlett NoseKevin  Dover M.D.   On: 12/26/2016 13:30    Procedures Procedures (including critical care time)  Medications Ordered in ED Medications - No data to display   Initial Impression / Assessment and Plan / ED Course  I have reviewed the triage vital signs and the nursing notes.  Pertinent labs & imaging results that were available during my care of the patient were reviewed by me and considered in my medical decision making (see chart for details).     83 y.o. female here with mechanical fall today while getting out of her house, her foot caught on the ground and she fell into the doorway hitting her L head on the wall, and then sliding down onto her bottom. Denies any pain except for R neck pain that was going on before the fall, after she woke up; thought she slept weird. On exam, small hematoma to L posterior scalp, mildly tender, no hemotympanum or s/sx of  basilar skull fx, no focal neuro deficits, mild R paracervical muscle TTP and spasm, no midline spinal tenderness; no tenderness to hips or extremities. Will get CT head/neck to ensure no injury, however doubt need for other labs/imaging. Pt declines wanting anything for pain. Will reassess shortly. Discussed case with my attending Dr. Estell HarpinZammit who agrees with plan.   2:57 PM CT head/neck without acute findings, degenerative findings of cervical spine as expected but no other acute findings in head/neck imaging. Pt ambulatory with steady gait and continues to remain stable. Advised ice use to head and heat use to neck, discussed tylenol/motrin for pain, and f/up with PCP in 5-7 days for recheck. I explained the diagnosis and have given explicit precautions to return to the ER including for any other new or worsening symptoms. The patient understands and accepts the medical plan as it's been dictated and I have answered their questions. Discharge instructions concerning home care and prescriptions have been given. The patient is STABLE and is discharged to home in good condition.    Final Clinical Impressions(s) / ED Diagnoses   Final diagnoses:  Injury of head, initial encounter  Contusion of scalp, initial encounter  Fall, initial encounter  Bradycardia  Cervical arthritis  Muscle spasms of neck    New Prescriptions New Prescriptions   No medications on [address]       Lataya Varnell, BenavidesMercedes, New JerseyPA-C 12/26/16 1502    Bethann BerkshireZammit, Joseph, MD 12/26/16 1625

## 2016-12-26 NOTE — ED Triage Notes (Signed)
Per EMS, pt from home, reports tripping on the way out of her building and fell.  Reports hitting the back of her head on the wall, small hematoma in back of her head per Paramedic.  Denies LOC.  C/o R shoulder pain.  Pt is A&O x 4.

## 2016-12-26 NOTE — ED Notes (Signed)
Pt is resting comfortably, she was requesting for something to eat and drink.  EDPA wants to hold off until the CT is resulted.  Pt made aware.

## 2016-12-26 NOTE — Discharge Instructions (Signed)
Alternate between Ibuprofen and Tylenol for pain. Get plenty of rest, use ice on your head, and use warm compress on your neck to help with pain.  Stay in a quiet, not simulating, dark environment until your symptoms improve. Follow Up with primary care physician in 5-7 days for recheck of symptoms.  Return to the emergency department if patient becomes lethargic, begins vomiting or other change in mental status, or any other changes/worsening symptoms.

## 2016-12-26 NOTE — ED Notes (Signed)
Pt reports she was walking out her building sliding door to get on the [employer] bus to go to the grocery store when she tripped on something and lost her balance.  Reports hitting her head on the wall.  She reports neck pain which started before the fall and R shoulder pain.  Pt is able to move her R arm up and down without difficulty.  Pt denies dizziness or lightheadedness prior to fall.  Pt is A&O x 4.

## 2017-01-08 ENCOUNTER — Encounter (INDEPENDENT_AMBULATORY_CARE_PROVIDER_SITE_OTHER): Payer: Self-pay | Admitting: Orthopaedic Surgery

## 2017-01-08 ENCOUNTER — Ambulatory Visit (INDEPENDENT_AMBULATORY_CARE_PROVIDER_SITE_OTHER): Payer: Medicare Other

## 2017-01-08 ENCOUNTER — Ambulatory Visit (INDEPENDENT_AMBULATORY_CARE_PROVIDER_SITE_OTHER): Payer: Medicare Other | Admitting: Orthopaedic Surgery

## 2017-01-08 VITALS — BP 168/68 | HR 54

## 2017-01-08 DIAGNOSIS — M79605 Pain in left leg: Secondary | ICD-10-CM

## 2017-01-08 DIAGNOSIS — M25552 Pain in left hip: Secondary | ICD-10-CM | POA: Diagnosis not present

## 2017-01-08 NOTE — Progress Notes (Signed)
Office Visit Note   Patient: Stephanie Atkinson           Date of Birth: [birth year]           MRN: [id number] Visit Date: 01/08/2017              Requested by: No referring provider defined for this encounter. PCP: System, Pcp Not In   Assessment & Plan: Visit Diagnoses:  1. Left hip pain   2. Pain in left leg     Plan:   Follow-Up Instructions: Return if symptoms worsen or fail to improve.   Orders:  Orders Placed This Encounter  Procedures  . XR HIP UNILAT W OR W/O PELVIS 2-3 VIEWS LEFT  . XR Tibia/Fibula Left   No orders of the defined types were placed in this encounter.     Procedures: No procedures performed   Clinical Data: No additional findings.   Subjective: Chief Complaint  Patient presents with  . Left Leg - Pain  . Left Hip - Pain    HPI 83 year old female had a fall 2 weeks ago as she was going out of her house thru  the door landing on her left hip with ecchymosis bruising soreness in some pain is been shooting down her left leg occasionally down to her foot mostly at night.  The ecchymosis has resolved.  She states she got some temporary relief with the right knee injection in August and the right knee is gradually gotten a little bit more sore.  She has some occasional back pain off and on.  She denies any fever or chills.  Review of Systems 14 point system updated negative for changes from last office visit 10/02/2016 other than as mentioned above in HPI with a recent fall.   Objective: Vital Signs: BP (!) 168/68   Pulse (!) 54   Physical Exam  Constitutional: She is oriented to person, place, and time. She appears well-developed.  HENT:  Head: Normocephalic.  Right Ear: External ear normal.  Left Ear: External ear normal.  Eyes: Pupils are equal, round, and reactive to light.  Neck: No tracheal deviation present. No thyromegaly present.  Cardiovascular: Normal rate.  Pulmonary/Chest: Effort normal.  Abdominal: Soft.  Neurological: She is  alert and oriented to person, place, and time.  Skin: Skin is warm and dry.  Psychiatric: She has a normal mood and affect. Her behavior is normal.    Ortho Exam patient has no pain with internal or external rotation of the left hip and internally rotate 40 degrees comfortably she has trochanteric tenderness resolution of ecchymosis is noted.  Knee has full extension.  Slight tenderness the peroneal nerve at the fibular neck.  Mild crepitus with knee extension distal pulses palpable.  She is able to ambulate without limp.  Mild sciatic notch tenderness on the left mild on the right.  Right greater trochanter is tender.  Well-healed total hip arthroplasty incision.  Specialty Comments:  No specialty comments available.  Imaging: Xr Hip Unilat W Or W/o Pelvis 2-3 Views Left  Result Date: 01/08/2017 AP pelvis frog-leg left hip were obtained that shows well-positioned hybrid total hip arthroplasty on the right with cemented stem without loosening.  Slight heterotopic calcification about the joint is noted acetabulum is in good position without loosening.  Left hip shows joint space narrowing and marginal osteophytes consistent with some mild to mild/moderate hip osteoarthritis without erosion of the head. Impression: Negative x-rays for acute changes post fall.  There is  some hip arthritis on the left and well-positioned total hip arthroplasty on the right  Xr Tibia/fibula Left  Result Date: 01/08/2017 AP lateral tib-fib x-rays were obtained and reviewed these are negative for acute injury after a fall.  No significant knee or ankle arthritis noted.  Soft tissues are normal. Impression: Tib-fib x-rays negative for acute changes post fall.    PMFS History: Patient Active Problem List   Diagnosis Date Noted  . Chronic pain of right knee 05/17/2016  . De Quervain's tenosynovitis 05/17/2016  . CAD (coronary artery disease) of artery bypass graft 02/13/2015  . Encounter for loop recorder check  02/01/2014  . Accelerated junctional rhythm 05/04/2013  . Dehydration 10/14/2012  . Syncope and collapse 10/13/2012  . History of first degree atrioventricular block 10/11/2012  . Chronic anticoagulation- Eloquis started and Plavix stopped 10/07/12 10/06/2012  . AVB - beta blocker and Amiodarone stopped 07/10/2012  . Hypertension   . Hx of CABG X 4 3/06-    . Dyslipidemia   . RCA DES placed 7/12- patent 8/14   . Permanent atrial fibrillation (HCC)   . Chronic kidney disease, stage 3 (HCC)    Past Medical History:  Diagnosis Date  . Chronic kidney disease, stage 3 (HCC)   . Coronary artery disease   . Hx of CABG 2006   LIMA-LAD, free RIMA-PDA, Radial-OM1-OM2  . Hyperlipidemia   . Hypertension   . Hypothyroid   . MI, old       . PAF (paroxysmal atrial fibrillation) (HCC)    was on amio, d/c'd due to prolonged PR interval, rate control  . Presence of stent in right coronary artery 07/12   RIMA-PDA occluded, DES stent placed  . Syncope 07/12   bradycardic, beta blocker decreased, also felt to be dehydrated    Family History  Problem Relation Age of Onset  . CAD Mother   . CAD Maternal Grandmother     Past Surgical History:  Procedure Laterality Date  . CARDIAC CATHETERIZATION  [id number]   dr. Onalee Huadavid harding, revealing a atretic bypass to her right with patent distal RCA stent  . CAROTID STENT     2012  . CORONARY ARTERY BYPASS GRAFT  2006   LIMA-LAD, free RIMA-PDA, Radial-OM1-OM2  . DOPPLER ECHOCARDIOGRAPHY  [id number]   mild asymmetric left ventricular hypertrophy, left ventricular systolic function is low normal, ejection fraction = 50-55%, the LA is moderate dilated, the RA is mildly dilated, no significant valvular disease  . JOINT REPLACEMENT     hip replacement  . NM MYOVIEW LTD  100510   post stress left ventricle is normal in size, post stress ejection fraction is 67% global left ventricular systolic function is normal, normal myocardial perfusion study, abnormal  myocardial perfusion study, low risk scan  . THYROIDECTOMY     Social History   Occupational History  . Not on file  Tobacco Use  . Smoking status: Never Smoker  . Smokeless tobacco: Current User    Types: Snuff  Substance and Sexual Activity  . Alcohol use: No  . Drug use: No  . Sexual activity: Not on file

## 2017-01-10 ENCOUNTER — Ambulatory Visit: Payer: Medicare Other | Admitting: Cardiovascular Disease

## 2017-02-01 ENCOUNTER — Other Ambulatory Visit: Payer: Self-pay | Admitting: Cardiovascular Disease

## 2017-05-20 ENCOUNTER — Ambulatory Visit: Payer: Medicare Other | Admitting: Cardiovascular Disease

## 2017-05-20 ENCOUNTER — Telehealth: Payer: Self-pay | Admitting: Cardiovascular Disease

## 2017-05-20 ENCOUNTER — Encounter: Payer: Self-pay | Admitting: Cardiovascular Disease

## 2017-05-20 VITALS — BP 148/70 | HR 48 | Ht 63.0 in | Wt 154.2 lb

## 2017-05-20 DIAGNOSIS — Z951 Presence of aortocoronary bypass graft: Secondary | ICD-10-CM

## 2017-05-20 DIAGNOSIS — I10 Essential (primary) hypertension: Secondary | ICD-10-CM | POA: Diagnosis not present

## 2017-05-20 DIAGNOSIS — I482 Chronic atrial fibrillation: Secondary | ICD-10-CM

## 2017-05-20 DIAGNOSIS — I4821 Permanent atrial fibrillation: Secondary | ICD-10-CM

## 2017-05-20 NOTE — Patient Instructions (Signed)
Medication Instructions: Your physician recommends that you continue on your current medications as directed. Please refer to the Current Medication list given to you today.   Follow-Up: Your physician wants you to follow-up in: 1 year with Dr. Allyson SabalBerry. You will receive a reminder letter in the mail two months in advance. If you don't receive a letter, please call our office to schedule the follow-up appointment.  Your physician recommends that you schedule a follow-up appointment in 1 month with Dr. Royann Shiversroitoru for Bradycardia and fatigue.  If you need a refill on your cardiac medications before your next appointment, please call your pharmacy.

## 2017-05-20 NOTE — Progress Notes (Signed)
05/20/2017 Stephanie Atkinson   1924/11/06  [id number]  Primary Physician System, Pcp Not In Primary Cardiologist: Runell GessJonathan J Mikita Lesmeister MD FACP, [place], [place], [place]  HPI:  Stephanie Atkinson is a 84 y.o.  with a history of CAD who I last saw in the office 08/24/16.[name]. She had CABG X 4 in 3/06. She had a DES placed to her PDA in July 2012, this was patent on follow up cath in Oct 2012 and recently 10/12/12. She did have distal disease that is to be treated medically. She h in the office 07/10/12 and was noted to have a long 1st degree AVB on her EKG, her PR was > 4. She was asymptomatic. She has a history of PAF since her CABG in 2006 and had been on Toprol, Amiodarone, ASA, and Plavix. At that time we stopped her Toprol and decreased her Amiodarone. When she was seen in follow up 07/29/12 her PR remained prolonged her Amiodarone was discontinued. On 10/06/12 she was seen and noted to be in AF with CVR. She was placed on Eloquis and her Plavix was Stopped. She the was admitted a few days later with chest pain with subtle EKG changes. She was cathed 10/12/12 and her anatomy was unchanged. She was discharge but readmitted later the 19th after a syncopal spell. She had a loop recorder implanted 10/14/12. She was noted to be mildly dehydrated and this, as well as new nitrate therapy, may have contributed to her syncope. Since I saw her a year ago she's remained stable. She does complain of some fatigue over the last several months and has a slow ventricular response with her A. Fib today.    Current Meds  Medication Sig  . acetaminophen (TYLENOL) 325 MG tablet Take 650 mg by mouth every 4 (four) hours as needed. For pain  . amLODipine (NORVASC) 5 MG tablet Take 5 mg by mouth daily.  [name]. atorvastatin (LIPITOR) 80 MG tablet Take 80 mg by mouth daily.  . clobetasol cream (TEMOVATE) 0.05 % Apply 1 application topically as needed (to legs).   [name]. ELIQUIS 5 MG TABS tablet TAKE 1 TABLET BY MOUTH TWICE A DAY  . furosemide (LASIX) 40  MG tablet Take 1 tablet (40 mg total) by mouth daily.  . irbesartan (AVAPRO) 300 MG tablet Take 300 mg by mouth daily.  [name]. levothyroxine (SYNTHROID, LEVOTHROID) 50 MCG tablet Take 50 mcg by mouth daily.  . meclizine (ANTIVERT) 25 MG tablet Take 1 tablet (25 mg total) by mouth 3 (three) times daily as needed for dizziness.  . nitroGLYCERIN (NITROSTAT) 0.4 MG SL tablet Place 0.4 mg under the tongue every 5 (five) minutes as needed. For chest pain  . traMADol (ULTRAM) 50 MG tablet Take 50 mg by mouth 2 (two) times daily.   . valsartan (DIOVAN) 320 MG tablet Take 1 tablet (320 mg total) by mouth daily.     Allergies  Allergen Reactions  . Penicillins Hives  . Sulfa Antibiotics Swelling    Social History   Socioeconomic History  . Marital status: Single    Spouse name: Not on file  . Number of children: Not on file  . Years of education: Not on file  . Highest education level: Not on file  Occupational History  . Not on file  Social Needs  . Financial resource strain: Not on file  . Food insecurity:    Worry: Not on file    Inability: Not on file  . Transportation needs:  Medical: Not on file    Non-medical: Not on file  Tobacco Use  . Smoking status: Never Smoker  . Smokeless tobacco: Current User    Types: Snuff  Substance and Sexual Activity  . Alcohol use: No  . Drug use: No  . Sexual activity: Not on file  Lifestyle  . Physical activity:    Days per week: Not on file    Minutes per session: Not on file  . Stress: Not on file  Relationships  . Social connections:    Talks on phone: Not on file    Gets together: Not on file    Attends religious service: Not on file    Active member of club or organization: Not on file    Attends meetings of clubs or organizations: Not on file    Relationship status: Not on file  . Intimate partner violence:    Fear of current or ex partner: Not on file    Emotionally abused: Not on file    Physically abused: Not on file     Forced sexual activity: Not on file  Other Topics Concern  . Not on file  Social History Narrative  . Not on file     Review of Systems: General: negative for chills, fever, night sweats or weight changes.  Cardiovascular: negative for chest pain, dyspnea on exertion, edema, orthopnea, palpitations, paroxysmal nocturnal dyspnea or shortness of breath Dermatological: negative for rash Respiratory: negative for cough or wheezing Urologic: negative for hematuria Abdominal: negative for nausea, vomiting, diarrhea, bright red blood per rectum, melena, or hematemesis Neurologic: negative for visual changes, syncope, or dizziness All other systems reviewed and are otherwise negative except as noted above.    Blood pressure (!) 168/62, pulse (!) 48, height 5\' 3"  (1.6 m), weight 154 lb 3.2 oz (69.9 kg).  General appearance: alert and no distress Neck: no adenopathy, no carotid bruit, no JVD, supple, symmetrical, trachea midline and thyroid not enlarged, symmetric, no tenderness/mass/nodules Lungs: clear to auscultation bilaterally Heart: regular rate and rhythm, S1, S2 normal, no murmur, click, rub or gallop Extremities: extremities normal, atraumatic, no cyanosis or edema Pulses: 2+ and symmetric Skin: Skin color, texture, turgor normal. No rashes or lesions Neurologic: Alert and oriented X 3, normal strength and tone. Normal symmetric reflexes. Normal coordination and gait  EKG atrial fibrillation with a ventricular response of 48, septal Q waves and lethargy for hypertrophy with diffuse T-wave inversion. I personally reviewed this EKG.  ASSESSMENT AND PLAN:   Hypertension History of essential hypertension with blood pressure measured 168/62. She is on amlodipine and Diovan. Continue current meds at current dosing  Hx of CABG X 4 3/06-  And she has CAD status post coronary artery bypass grafting 4 in March 2006. She had drug-eluting stent placed to her PDA July 2012 which was patent on  follow-up October 2012 and 10/12/12. She denies chest pain or shortness of breath.  Permanent atrial fibrillation (HCC) History of permanent atrial fibrillation with slow ventricular response on Elliquis oral anticoagulation.she complains of chronic fatigue. Her heart rate today is in the 40s. She does have expired loop recorder implanted back in 2014 by Dr. Royann Shivers. I am referring her back to him for consideration of permanent transvenous pacing.      Runell Gess MD FACP,FACC,FAHA, [facility] 05/20/2017 11:19 AM

## 2017-05-20 NOTE — Assessment & Plan Note (Signed)
History of essential hypertension with blood pressure measured 168/62. She is on amlodipine and Diovan. Continue current meds at current dosing

## 2017-05-20 NOTE — Telephone Encounter (Signed)
Returned call to patient.She stated Dr.Berry's CMA wanted to know name of Dr.who prescribed Irbesartan and Amlodipine.Stated Dr.Kim prescribed both.Advised I will send message to her.Advised I will also send message to Dr.Croitoru's CMA for appointment.

## 2017-05-20 NOTE — Assessment & Plan Note (Signed)
And she has CAD status post coronary artery bypass grafting 4 in March 2006. She had drug-eluting stent placed to her PDA July 2012 which was patent on follow-up October 2012 and 10/12/12. She denies chest pain or shortness of breath.

## 2017-05-20 NOTE — Telephone Encounter (Signed)
NEW MESSAGE    Patient calling to inform nurse that Dr Pixie CasinoJ. Kim prescribed other medications.

## 2017-05-20 NOTE — Assessment & Plan Note (Signed)
History of permanent atrial fibrillation with slow ventricular response on Elliquis oral anticoagulation.she complains of chronic fatigue. Her heart rate today is in the 40s. She does have expired loop recorder implanted back in 2014 by Dr. Royann Shiversroitoru. I am referring her back to him for consideration of permanent transvenous pacing.

## 2017-05-21 ENCOUNTER — Ambulatory Visit (INDEPENDENT_AMBULATORY_CARE_PROVIDER_SITE_OTHER): Payer: Medicare Other | Admitting: Orthopaedic Surgery

## 2017-06-10 ENCOUNTER — Encounter (INDEPENDENT_AMBULATORY_CARE_PROVIDER_SITE_OTHER): Payer: Self-pay | Admitting: Orthopaedic Surgery

## 2017-06-10 ENCOUNTER — Ambulatory Visit (INDEPENDENT_AMBULATORY_CARE_PROVIDER_SITE_OTHER): Payer: Medicare Other | Admitting: Orthopaedic Surgery

## 2017-06-10 VITALS — BP 149/68 | HR 47 | Ht 63.0 in | Wt 154.0 lb

## 2017-06-10 DIAGNOSIS — G8929 Other chronic pain: Secondary | ICD-10-CM | POA: Diagnosis not present

## 2017-06-10 DIAGNOSIS — M25561 Pain in right knee: Secondary | ICD-10-CM | POA: Diagnosis not present

## 2017-06-10 NOTE — Progress Notes (Signed)
Office Visit Note   Patient: Stephanie Atkinson           Date of Birth: [birth year]           MRN: [id number] Visit Date: 06/10/2017              Requested by: No referring provider defined for this encounter. PCP: System, Pcp Not In   Assessment & Plan: Visit Diagnoses:  1. Chronic pain of right knee     Plan: Knee symptoms are stable at this point.  The pain that she had in her left leg is probably related to some disc protrusion and the symptoms have improved.  She is walking well today.  She will call if she has increased symptoms.  Follow-Up Instructions: No follow-ups on file.   Orders:  No orders of the defined types were placed in this encounter.  No orders of the defined types were placed in this encounter.     Procedures: No procedures performed   Clinical Data: No additional findings.   Subjective: Chief Complaint  Patient presents with  . Lower Back - Pain  . Right Knee - Pain  . Left Hip - Pain    HPI 84 year old female returns and states last Saturday she had sharp pain that radiated from trochanter down to the lateral ankle with numbness and states she could barely walk.  She took Tylenol and tramadol and states to gradually ease during the day and finally she could walk.  She is had previous x-rays of lumbar spine 2018 that showed normal anatomy.  States that pain is resolved.  She is had previous injections in her right knee but the last injection August 2018 did not last as long as previous and injections.  Recent problems with bradycardia and she is possibly getting a pacemaker coming up soon and has an appointment on 5 03/2017.  Previous hip x-rays August 2018 did not show any significant osteoarthritis.  Today she is ambulating in the office and states her pain is better but she still has a little bit of numbness in the lateral calf.  Review of Systems 14 point review of systems updated unchanged from 10/02/2017 office visit other than as mentioned above.   Previous CABG procedure with left radial artery graft.   Objective: Vital Signs: BP (!) 149/68   Pulse (!) 47   Ht 5\' 3"  (1.6 m)   Wt 154 lb (69.9 kg)   BMI 27.28 kg/m   Physical Exam  Constitutional: She is oriented to person, place, and time. She appears well-developed.  HENT:  Head: Normocephalic.  Right Ear: External ear normal.  Left Ear: External ear normal.  Eyes: Pupils are equal, round, and reactive to light.  Neck: No tracheal deviation present. No thyromegaly present.  Cardiovascular: Normal rate.  Pulmonary/Chest: Effort normal.  Abdominal: Soft.  Neurological: She is alert and oriented to person, place, and time.  Skin: Skin is warm and dry.  Psychiatric: She has a normal mood and affect. Her behavior is normal.  Palpable record her left chest wall.   Ortho Exam patient has crepitus with right knee range of motion no significant pain with hip internal/external rotation negative logroll.  Knees reach full extension.  Bradycardia noted pulse of 47.  She is amatory with negative Trendelenburg gait she does have bilateral greater trochanteric tenderness with palpation.  Specialty Comments:  No specialty comments available.  Imaging: No results found.   PMFS History: Patient Active Problem List  Diagnosis Date Noted  . Chronic pain of right knee 05/17/2016  . De Quervain's tenosynovitis 05/17/2016  . CAD (coronary artery disease) of artery bypass graft 02/13/2015  . Encounter for loop recorder check 02/01/2014  . Accelerated junctional rhythm 05/04/2013  . Dehydration 10/14/2012  . Syncope and collapse 10/13/2012  . History of first degree atrioventricular block 10/11/2012  . Chronic anticoagulation- Eloquis started and Plavix stopped 10/07/12 10/06/2012  . AVB - beta blocker and Amiodarone stopped 07/10/2012  . Hypertension   . Hx of CABG X 4 3/06-    . Dyslipidemia   . RCA DES placed 7/12- patent 8/14   . Permanent atrial fibrillation (HCC)   . Chronic  kidney disease, stage 3 (HCC)    Past Medical History:  Diagnosis Date  . Chronic kidney disease, stage 3 (HCC)   . Coronary artery disease   . Hx of CABG 2006   LIMA-LAD, free RIMA-PDA, Radial-OM1-OM2  . Hyperlipidemia   . Hypertension   . Hypothyroid   . MI, old       . PAF (paroxysmal atrial fibrillation) (HCC)    was on amio, d/c'd due to prolonged PR interval, rate control  . Presence of stent in right coronary artery 07/12   RIMA-PDA occluded, DES stent placed  . Syncope 07/12   bradycardic, beta blocker decreased, also felt to be dehydrated    Family History  Problem Relation Age of Onset  . CAD Mother   . CAD Maternal Grandmother     Past Surgical History:  Procedure Laterality Date  . CARDIAC CATHETERIZATION  [id number]   dr. Onalee Huadavid harding, revealing a atretic bypass to her right with patent distal RCA stent  . CAROTID STENT     2012  . CORONARY ARTERY BYPASS GRAFT  2006   LIMA-LAD, free RIMA-PDA, Radial-OM1-OM2  . DOPPLER ECHOCARDIOGRAPHY  [id number]   mild asymmetric left ventricular hypertrophy, left ventricular systolic function is low normal, ejection fraction = 50-55%, the LA is moderate dilated, the RA is mildly dilated, no significant valvular disease  . JOINT REPLACEMENT     hip replacement  . LEFT HEART CATHETERIZATION WITH CORONARY/GRAFT ANGIOGRAM N/A 10/12/2012   Procedure: LEFT HEART CATHETERIZATION WITH Isabel CapriceORONARY/GRAFT ANGIOGRAM;  Surgeon: Marykay Lexavid W Harding, MD;  [facility];  Service: Cardiovascular;  Laterality: N/A;  . LOOP RECORDER IMPLANT N/A 10/15/2012   Procedure: LOOP RECORDER IMPLANT;  Surgeon: Thurmon FairMihai Croitoru, MD;  Location: [facility] CATH LAB;  Service: Cardiovascular;  Laterality: N/A;  . NM MYOVIEW LTD  100510   post stress left ventricle is normal in size, post stress ejection fraction is 67% global left ventricular systolic function is normal, normal myocardial perfusion study, abnormal myocardial perfusion study, low risk scan  . THYROIDECTOMY       Social History   Occupational History  . Not on file  Tobacco Use  . Smoking status: Never Smoker  . Smokeless tobacco: Current User    Types: Snuff  Substance and Sexual Activity  . Alcohol use: No  . Drug use: No  . Sexual activity: Not on file

## 2017-06-27 ENCOUNTER — Ambulatory Visit: Payer: Medicare Other | Admitting: Cardiovascular Disease

## 2017-06-27 ENCOUNTER — Encounter: Payer: Self-pay | Admitting: Cardiovascular Disease

## 2017-06-27 VITALS — BP 158/72 | HR 55 | Ht 63.0 in | Wt 154.0 lb

## 2017-06-27 DIAGNOSIS — I10 Essential (primary) hypertension: Secondary | ICD-10-CM

## 2017-06-27 DIAGNOSIS — I4891 Unspecified atrial fibrillation: Secondary | ICD-10-CM

## 2017-06-27 DIAGNOSIS — E7800 Pure hypercholesterolemia, unspecified: Secondary | ICD-10-CM | POA: Insufficient documentation

## 2017-06-27 DIAGNOSIS — I480 Paroxysmal atrial fibrillation: Secondary | ICD-10-CM | POA: Insufficient documentation

## 2017-06-27 DIAGNOSIS — R001 Bradycardia, unspecified: Secondary | ICD-10-CM | POA: Insufficient documentation

## 2017-06-27 DIAGNOSIS — I25810 Atherosclerosis of coronary artery bypass graft(s) without angina pectoris: Secondary | ICD-10-CM | POA: Diagnosis not present

## 2017-06-27 DIAGNOSIS — Z4509 Encounter for adjustment and management of other cardiac device: Secondary | ICD-10-CM | POA: Diagnosis not present

## 2017-06-27 DIAGNOSIS — Z7901 Long term (current) use of anticoagulants: Secondary | ICD-10-CM

## 2017-06-27 DIAGNOSIS — E785 Hyperlipidemia, unspecified: Secondary | ICD-10-CM | POA: Insufficient documentation

## 2017-06-27 NOTE — H&P (View-Only) (Signed)
Patient ID: Stephanie Atkinson, female   DOB: 08/11/1924, 84 y.o.   MRN: [id number]    Cardiology Office Note    Date:  06/27/2017   ID:  Stephanie Atkinson, DOB 07/13/1924, MRN [id number]  PCP:  Kim, James, MD  Cardiologist:  Stephanie Berry, MD  Anecia Nusbaum, MD   Chief Complaint  Patient presents with  . Shortness of Breath    states when moving around   Referred to discuss pacemaker implantation.  History of Present Illness:  Stephanie Atkinson is a 84 y.o. female with CAD s/p CABG, PAFib. She received an implantable loop recorder (for syncope that has not recurred since implant), but the device has reached end of service.  She has been in what appears to be permanent atrial fibrillation with slow ventricular response.  When she saw Dr. Berry last month her ventricular rate was only 47 bpm.  She has been complaining of worsening fatigue.  Has had a couple of near syncopal events.  The patient specifically denies any chest pain at rest exertion, dyspnea at rest or with exertion, orthopnea, paroxysmal nocturnal dyspnea, syncope, palpitations, focal neurological deficits, intermittent claudication, lower extremity edema, unexplained weight gain, cough, hemoptysis or wheezing.  No serious bleeding problems with Eliquis.  Past Medical History:  Diagnosis Date  . Chronic kidney disease, stage 3 (HCC)   . Coronary artery disease   . Hx of CABG 2006   LIMA-LAD, free RIMA-PDA, Radial-OM1-OM2  . Hyperlipidemia   . Hypertension   . Hypothyroid   . MI, old       . PAF (paroxysmal atrial fibrillation) (HCC)    was on amio, d/c'd due to prolonged PR interval, rate control  . Presence of stent in right coronary artery 07/12   RIMA-PDA occluded, DES stent placed  . Syncope 07/12   bradycardic, beta blocker decreased, also felt to be dehydrated    Past Surgical History:  Procedure Laterality Date  . CARDIAC CATHETERIZATION  101512   dr. david harding, revealing a atretic bypass to her right with patent  distal RCA stent  . CAROTID STENT     2012  . CORONARY ARTERY BYPASS GRAFT  2006   LIMA-LAD, free RIMA-PDA, Radial-OM1-OM2  . DOPPLER ECHOCARDIOGRAPHY  080212   mild asymmetric left ventricular hypertrophy, left ventricular systolic function is low normal, ejection fraction = 50-55%, the LA is moderate dilated, the RA is mildly dilated, no significant valvular disease  . JOINT REPLACEMENT     hip replacement  . LEFT HEART CATHETERIZATION WITH CORONARY/GRAFT ANGIOGRAM N/A 10/12/2012   Procedure: LEFT HEART CATHETERIZATION WITH CORONARY/GRAFT ANGIOGRAM;  Surgeon: David W Harding, MD;  Location: [facility]ATH [facility];  Service: Cardiovascular;  Laterality: N/A;  . LOOP RECORDER IMPLANT N/A 10/15/2012   Procedure: LOOP RECORDER IMPLANT;  Surgeon: Roma Bierlein, MD;  Location: [facility];  Service: Cardiovascular;  Laterality: N/A;  . NM MYOVIEW LTD  100510   post stress left ventricle is normal in size, post stress ejection fraction is 67% global left ventricular systolic function is normal, normal myocardial perfusion study, abnormal myocardial perfusion study, low risk scan  . THYROIDECTOMY      Current Outpatient Medications  Medication Sig Dispense Refill  . acetaminophen (TYLENOL) 325 MG tablet Take 650 mg by mouth every 4 (four) hours as needed. For pain    . amLODipine (NORVASC) 5 MG tablet Take 5 mg by mouth daily.    . atorvastatin (LIPITOR) 80 MG tablet Take 80 mg by mouth   daily.    . clobetasol cream (TEMOVATE) 0.05 % Apply 1 application topically as needed (to legs).   3  . ELIQUIS 5 MG TABS tablet TAKE 1 TABLET BY MOUTH TWICE A DAY 180 tablet 1  . furosemide (LASIX) 40 MG tablet Take 1 tablet (40 mg total) by mouth daily. 90 tablet 3  . irbesartan (AVAPRO) 300 MG tablet Take 300 mg by mouth daily.  3  . levothyroxine (SYNTHROID, LEVOTHROID) 50 MCG tablet Take 50 mcg by mouth daily.    . meclizine (ANTIVERT) 25 MG tablet Take 1 tablet (25 mg total) by mouth 3 (three) times daily as needed  for dizziness. 30 tablet 0  . nitroGLYCERIN (NITROSTAT) 0.4 MG SL tablet Place 0.4 mg under the tongue every 5 (five) minutes as needed. For chest pain    . traMADol (ULTRAM) 50 MG tablet Take 50 mg by mouth 2 (two) times daily.     . valsartan (DIOVAN) 320 MG tablet Take 1 tablet (320 mg total) by mouth daily. 90 tablet 3   No current facility-administered medications for this visit.     Allergies:   Penicillins and Sulfa antibiotics   Social History   Socioeconomic History  . Marital status: Single    Spouse name: Not on file  . Number of children: Not on file  . Years of education: Not on file  . Highest education level: Not on file  Occupational History  . Not on file  Social Needs  . Financial resource strain: Not on file  . Food insecurity:    Worry: Not on file    Inability: Not on file  . Transportation needs:    Medical: Not on file    Non-medical: Not on file  Tobacco Use  . Smoking status: Never Smoker  . Smokeless tobacco: Current User    Types: Snuff  Substance and Sexual Activity  . Alcohol use: No  . Drug use: No  . Sexual activity: Not on file  Lifestyle  . Physical activity:    Days per week: Not on file    Minutes per session: Not on file  . Stress: Not on file  Relationships  . Social connections:    Talks on phone: Not on file    Gets together: Not on file    Attends religious service: Not on file    Active member of club or organization: Not on file    Attends meetings of clubs or organizations: Not on file    Relationship status: Not on file  Other Topics Concern  . Not on file  Social History Narrative  . Not on file     Family History:  The patient's family history includes CAD in her maternal grandmother and mother.   ROS:   Please see the history of present illness.    Review of Systems  All other systems reviewed and are negative.    PHYSICAL EXAM:   VS:  BP (!) 158/72   Pulse (!) 55   Ht 5' 3" (1.6 m)   Wt 154 lb (69.9 kg)    BMI 27.28 kg/m     General: Alert, oriented x3, no distress, lean.  Appears younger than stated age. Head: no evidence of trauma, PERRL, EOMI, no exophtalmos or lid lag, no myxedema, no xanthelasma; normal ears, nose and oropharynx Neck: normal jugular venous pulsations and no hepatojugular reflux; brisk carotid pulses without delay and no carotid bruits Chest: clear to auscultation, no signs of consolidation by   percussion or palpation, normal fremitus, symmetrical and full respiratory excursions Cardiovascular: normal position and quality of the apical impulse, slow irregular rhythm, normal first and second heart sounds, no murmurs, rubs or gallops Abdomen: no tenderness or distention, no masses by palpation, no abnormal pulsatility or arterial bruits, normal bowel sounds, no hepatosplenomegaly Extremities: no clubbing, cyanosis or edema; 2+ radial, ulnar and brachial pulses bilaterally; 2+ right femoral, posterior tibial and dorsalis pedis pulses; 2+ left femoral, posterior tibial and dorsalis pedis pulses; no subclavian or femoral bruits Neurological: grossly nonfocal Psych: Normal mood and affect   Wt Readings from Last 3 Encounters:  06/27/17 154 lb (69.9 kg)  06/10/17 154 lb (69.9 kg)  05/20/17 154 lb 3.2 oz (69.9 kg)      Studies/Labs Reviewed:   EKG:  EKG is ordered today.  It shows atrial fibrillation with slow ventricular response, LVH with typical secondary repolarization abnormalities.  The QRS is narrow.  Recent Labs: 11/30/2016: ALT 21; BUN 21; Creatinine, Ser 1.06; Hemoglobin 13.2; Platelets 174; Potassium 4.1; Sodium 140   Lipid Panel    Component Value Date/Time   CHOL 127 11/25/2016 1005   TRIG 117 11/25/2016 1005   HDL 46 11/25/2016 1005   CHOLHDL 2.8 11/25/2016 1005   CHOLHDL 2.6 01/05/2015 1118   VLDL 18 01/05/2015 1118   LDLCALC 58 11/25/2016 1005     ASSESSMENT:    1. Atrial fibrillation with slow ventricular response (HCC)   2. Symptomatic  bradycardia   3. Long term (current) use of anticoagulants   4. Essential hypertension   5. Encounter for loop recorder at end of battery life   6. Coronary artery disease involving coronary bypass graft of native heart without angina pectoris   7. Hypercholesterolemia      PLAN:  In order of problems listed above:  Atrial fibrillation with spontaneously slow ventricular response, now much more symptomatic. She is anticoagulated.  CHADSVasc 5 (age 4, hypertension, CAD, gender).  We discussed the pros and cons single-chamber pacemaker implantation, went over the possible complications, overnight hospital observation, activity restrictions, wound care, etc. Eliquis: Well-tolerated no bleeding complications HBP well controlled. Avoid any vacations with negative chronotropic effects such as beta blockers ILR: at end of service.  We will remove at the time of pacemaker implantation. CAD - Asymptomatic. > 6 years since last revascularization procedure, about 3 years ago cath was OK HLP: Excellent LDL-C on statin.   Medication Adjustments/Labs and Tests Ordered: Current medicines are reviewed at length with the patient today.  Concerns regarding medicines are outlined above.  Medication changes, Labs and Tests ordered today are listed below. Patient Instructions    You are scheduled for: Permanent Transvenous Pacemaker on Wednesday, May 22nd, 2019 with Dr Bill Yohn.  Please arrive at the [facility] "A" of [facility] ([address]) at 1:30 pm on the day of your procedure.  1. You may have breakfast the morning of your procedure but nothing to eat after 8:00 am. 2.  3. Complete labwork on Monday, May 20th 2019. We have a lab station located within our office if we are convenient for you. You do not need an appointment. In our lobby, to the right of check-in, there is a podium where you sign-in for labs. You do not have to be fasting.  4. You may continue your  current medications except: HOLD Eliquis for 48 hours prior to your procedure. Last dose will be Monday morning.  5. Plan for an overnight   stay.  6. Bring your insurance cards and a list of your current medications.  7. Wash your chest and neck with the surgical scrub provided the evening before and the morning of your procedure.  Rinse well. See "Preparing for Surgery"  If you have ANY questions after you get home, please call the office [phone].  * Special note:  Every effort is made to have your procedure done on time.  Occasionally there are emergencies that present themselves at the hospital that may cause delays.  Please be patient if a delay does occur.   Preparing for Surgery  Before surgery, you can play an important role. Because skin is not sterile, your skin needs to be as free of germs as possible. You can reduce the number of germs on your skin by washing with CHG (chlorhexidine gluconate) Soap before surgery. CHG is an antiseptic cleaner which kills germs and bonds with the skin to continue killing germs even after washing.  Please do not use if you have an allergy to CHG or antibacterial soaps. If your skin becomes reddened/irritated, STOP using the CHG.  DO NOT SHAVE (including legs and underarms) for at least 48 hours prior to first CHG shower. It is OK to shave your face.  Please follow these instructions carefully:  1. Shower the night before surgery and the morning of surgery with CHG Soap.  2. If you chose to wash your hair, wash your hair first as usual with your normal shampoo/conditioner.  3. After you shampoo/condition, rinse you hair and body thoroughly to remove shampoo/conditioner.  4. Use CHG as you would any other liquid soap. You can apply CHG directly to the skin and wash gently with a loofah or a clean washcloth.  5. Apply the CHG Soap to your body ONLY FROM THE NECK DOWN. Do not use on open wounds or open sores. Avoid contact with your eyes, ears,  mouth, and genitals (private parts). Wash genitals (private part) with your normal soap.  6. Wash thoroughly, paying special attention to the area where your surgery will be performed.  7. Thoroughly rinse your body with warm water from the neck down.  8. DO NOT shower/wash with your normal soap after using and rinsing off the CHG Soap.  9. Pat yourself dry with a clean towel.  10. Wear clean pajamas to bed.  11. Place clean sheets on your bed the night of your first shower and do not sleep with pets.  Day of Surgery:  Shower with the CHG Soap following the instructions listed above. DO NOT apply deodorants or lotions. Please wear clean clothes to the hospital/surgery center.     Signed, Deidrea Gaetz, MD  06/27/2017 1:12 PM    [facility] [address], [place], [place]  [address] Phone: [phone]; Fax: [phone]   

## 2017-06-27 NOTE — Patient Instructions (Addendum)
You are scheduled for: Permanent Transvenous Pacemaker on Wednesday, May 22nd, 2019 with Dr Royann Shivers.  Please arrive at the [facility] Entrance "A" of [facility] ([address] [place]) at 1:30 pm on the day of your procedure.  1. You may have breakfast the morning of your procedure but nothing to eat after 8:00 am. 2.  3. Complete labwork on Monday, May 20th 2019. We have a lab station located within our office if we are convenient for you. You do not need an appointment. In our lobby, to the right of check-in, there is a podium where you sign-in for labs. You do not have to be fasting.  4. You may continue your current medications except: HOLD Eliquis for 48 hours prior to your procedure. Last dose will be Monday morning.  5. Plan for an overnight stay.  6. Bring your insurance cards and a list of your current medications.  7. Wash your chest and neck with the surgical scrub provided the evening before and the morning of your procedure.  Rinse well. See "Preparing for Surgery"  If you have ANY questions after you get home, please call the office [phone].  * Special note:  Every effort is made to have your procedure done on time.  Occasionally there are emergencies that present themselves at the hospital that may cause delays.  Please be patient if a delay does occur.   Preparing for Surgery  Before surgery, you can play an important role. Because skin is not sterile, your skin needs to be as free of germs as possible. You can reduce the number of germs on your skin by washing with CHG (chlorhexidine gluconate) Soap before surgery. CHG is an antiseptic cleaner which kills germs and bonds with the skin to continue killing germs even after washing.  Please do not use if you have an allergy to CHG or antibacterial soaps. If your skin becomes reddened/irritated, STOP using the CHG.  DO NOT SHAVE (including legs and underarms) for at least 48 hours prior to first CHG  shower. It is OK to shave your face.  Please follow these instructions carefully:  1. Shower the night before surgery and the morning of surgery with CHG Soap.  2. If you chose to wash your hair, wash your hair first as usual with your normal shampoo/conditioner.  3. After you shampoo/condition, rinse you hair and body thoroughly to remove shampoo/conditioner.  4. Use CHG as you would any other liquid soap. You can apply CHG directly to the skin and wash gently with a loofah or a clean washcloth.  5. Apply the CHG Soap to your body ONLY FROM THE NECK DOWN. Do not use on open wounds or open sores. Avoid contact with your eyes, ears, mouth, and genitals (private parts). Wash genitals (private part) with your normal soap.  6. Wash thoroughly, paying special attention to the area where your surgery will be performed.  7. Thoroughly rinse your body with warm water from the neck down.  8. DO NOT shower/wash with your normal soap after using and rinsing off the CHG Soap.  9. Pat yourself dry with a clean towel.  10. Wear clean pajamas to bed.  11. Place clean sheets on your bed the night of your first shower and do not sleep with pets.  Day of Surgery:  Shower with the CHG Soap following the instructions listed above. DO NOT apply deodorants or lotions. Please wear clean clothes to the hospital/surgery center.

## 2017-06-27 NOTE — Progress Notes (Signed)
Patient ID: Stephanie Atkinson, female   DOB: [birth year], 84 y.o.   MRN: [id number]    Cardiology Office Note    Date:  06/27/2017   ID:  Stephanie Atkinson, DOB 03-29-1924, MRN [id number]  PCP:  Pearson Grippe, MD  Cardiologist:  Nanetta Batty, MD  Thurmon Fair, MD   Chief Complaint  Patient presents with  . Shortness of Breath    states when moving around   Referred to discuss pacemaker implantation.  History of Present Illness:  Stephanie Atkinson is a 84 y.o. female with CAD s/p CABG, PAFib. She received an implantable loop recorder (for syncope that has not recurred since implant), but the device has reached end of service.  She has been in what appears to be permanent atrial fibrillation with slow ventricular response.  When she saw Dr. Allyson Sabal last month her ventricular rate was only 47 bpm.  She has been complaining of worsening fatigue.  Has had a couple of near syncopal events.  The patient specifically denies any chest pain at rest exertion, dyspnea at rest or with exertion, orthopnea, paroxysmal nocturnal dyspnea, syncope, palpitations, focal neurological deficits, intermittent claudication, lower extremity edema, unexplained weight gain, cough, hemoptysis or wheezing.  No serious bleeding problems with Eliquis.  Past Medical History:  Diagnosis Date  . Chronic kidney disease, stage 3 (HCC)   . Coronary artery disease   . Hx of CABG 2006   LIMA-LAD, free RIMA-PDA, Radial-OM1-OM2  . Hyperlipidemia   . Hypertension   . Hypothyroid   . MI, old       . PAF (paroxysmal atrial fibrillation) (HCC)    was on amio, d/c'd due to prolonged PR interval, rate control  . Presence of stent in right coronary artery 07/12   RIMA-PDA occluded, DES stent placed  . Syncope 07/12   bradycardic, beta blocker decreased, also felt to be dehydrated    Past Surgical History:  Procedure Laterality Date  . CARDIAC CATHETERIZATION  782956   dr. Onalee Hua harding, revealing a atretic bypass to her right with patent  distal RCA stent  . CAROTID STENT     2012  . CORONARY ARTERY BYPASS GRAFT  2006   LIMA-LAD, free RIMA-PDA, Radial-OM1-OM2  . DOPPLER ECHOCARDIOGRAPHY  [id number]   mild asymmetric left ventricular hypertrophy, left ventricular systolic function is low normal, ejection fraction = 50-55%, the LA is moderate dilated, the RA is mildly dilated, no significant valvular disease  . JOINT REPLACEMENT     hip replacement  . LEFT HEART CATHETERIZATION WITH CORONARY/GRAFT ANGIOGRAM N/A 10/12/2012   Procedure: LEFT HEART CATHETERIZATION WITH Isabel Caprice;  Surgeon: Marykay Lex, MD;  Location: [facility] CATH LAB;  Service: Cardiovascular;  Laterality: N/A;  . LOOP RECORDER IMPLANT N/A 10/15/2012   Procedure: LOOP RECORDER IMPLANT;  Surgeon: Thurmon Fair, MD;  Location: [facility] CATH LAB;  Service: Cardiovascular;  Laterality: N/A;  . NM MYOVIEW LTD  100510   post stress left ventricle is normal in size, post stress ejection fraction is 67% global left ventricular systolic function is normal, normal myocardial perfusion study, abnormal myocardial perfusion study, low risk scan  . THYROIDECTOMY      Current Outpatient Medications  Medication Sig Dispense Refill  . acetaminophen (TYLENOL) 325 MG tablet Take 650 mg by mouth every 4 (four) hours as needed. For pain    . amLODipine (NORVASC) 5 MG tablet Take 5 mg by mouth daily.    [name] atorvastatin (LIPITOR) 80 MG tablet Take 80 mg by mouth  daily.    . clobetasol cream (TEMOVATE) 0.05 % Apply 1 application topically as needed (to legs).   3  . ELIQUIS 5 MG TABS tablet TAKE 1 TABLET BY MOUTH TWICE A DAY 180 tablet 1  . furosemide (LASIX) 40 MG tablet Take 1 tablet (40 mg total) by mouth daily. 90 tablet 3  . irbesartan (AVAPRO) 300 MG tablet Take 300 mg by mouth daily.  3  . levothyroxine (SYNTHROID, LEVOTHROID) 50 MCG tablet Take 50 mcg by mouth daily.    . meclizine (ANTIVERT) 25 MG tablet Take 1 tablet (25 mg total) by mouth 3 (three) times daily as needed  for dizziness. 30 tablet 0  . nitroGLYCERIN (NITROSTAT) 0.4 MG SL tablet Place 0.4 mg under the tongue every 5 (five) minutes as needed. For chest pain    . traMADol (ULTRAM) 50 MG tablet Take 50 mg by mouth 2 (two) times daily.     . valsartan (DIOVAN) 320 MG tablet Take 1 tablet (320 mg total) by mouth daily. 90 tablet 3   No current facility-administered medications for this visit.     Allergies:   Penicillins and Sulfa antibiotics   Social History   Socioeconomic History  . Marital status: Single    Spouse name: Not on file  . Number of children: Not on file  . Years of education: Not on file  . Highest education level: Not on file  Occupational History  . Not on file  Social Needs  . Financial resource strain: Not on file  . Food insecurity:    Worry: Not on file    Inability: Not on file  . Transportation needs:    Medical: Not on file    Non-medical: Not on file  Tobacco Use  . Smoking status: Never Smoker  . Smokeless tobacco: Current User    Types: Snuff  Substance and Sexual Activity  . Alcohol use: No  . Drug use: No  . Sexual activity: Not on file  Lifestyle  . Physical activity:    Days per week: Not on file    Minutes per session: Not on file  . Stress: Not on file  Relationships  . Social connections:    Talks on phone: Not on file    Gets together: Not on file    Attends religious service: Not on file    Active member of club or organization: Not on file    Attends meetings of clubs or organizations: Not on file    Relationship status: Not on file  Other Topics Concern  . Not on file  Social History Narrative  . Not on file     Family History:  The patient's family history includes CAD in her maternal grandmother and mother.   ROS:   Please see the history of present illness.    Review of Systems  All other systems reviewed and are negative.    PHYSICAL EXAM:   VS:  BP (!) 158/72   Pulse (!) 55   Ht  (1.6 m)   Wt 154 lb (69.9 kg)    BMI 27.28 kg/m     General: Alert, oriented x3, no distress, lean.  Appears younger than stated age. Head: no evidence of trauma, PERRL, EOMI, no exophtalmos or lid lag, no myxedema, no xanthelasma; normal ears, nose and oropharynx Neck: normal jugular venous pulsations and no hepatojugular reflux; brisk carotid pulses without delay and no carotid bruits Chest: clear to auscultation, no signs of consolidation by  percussion or palpation, normal fremitus, symmetrical and full respiratory excursions Cardiovascular: normal position and quality of the apical impulse, slow irregular rhythm, normal first and second heart sounds, no murmurs, rubs or gallops Abdomen: no tenderness or distention, no masses by palpation, no abnormal pulsatility or arterial bruits, normal bowel sounds, no hepatosplenomegaly Extremities: no clubbing, cyanosis or edema; 2+ radial, ulnar and brachial pulses bilaterally; 2+ right femoral, posterior tibial and dorsalis pedis pulses; 2+ left femoral, posterior tibial and dorsalis pedis pulses; no subclavian or femoral bruits Neurological: grossly nonfocal Psych: Normal mood and affect   Wt Readings from Last 3 Encounters:  06/27/17 154 lb (69.9 kg)  06/10/17 154 lb (69.9 kg)  05/20/17 154 lb 3.2 oz (69.9 kg)      Studies/Labs Reviewed:   EKG:  EKG is ordered today.  It shows atrial fibrillation with slow ventricular response, LVH with typical secondary repolarization abnormalities.  The QRS is narrow.  Recent Labs: 11/30/2016: ALT 21; BUN 21; Creatinine, Ser 1.06; Hemoglobin 13.2; Platelets 174; Potassium 4.1; Sodium 140   Lipid Panel    Component Value Date/Time   CHOL 127 11/25/2016 1005   TRIG 117 11/25/2016 1005   HDL 46 11/25/2016 1005   CHOLHDL 2.8 11/25/2016 1005   CHOLHDL 2.6 01/05/2015 1118   VLDL 18 01/05/2015 1118   LDLCALC 58 11/25/2016 1005     ASSESSMENT:    1. Atrial fibrillation with slow ventricular response (HCC)   2. Symptomatic  bradycardia   3. Long term (current) use of anticoagulants   4. Essential hypertension   5. Encounter for loop recorder at end of battery life   6. Coronary artery disease involving coronary bypass graft of native heart without angina pectoris   7. Hypercholesterolemia      PLAN:  In order of problems listed above:  Atrial fibrillation with spontaneously slow ventricular response, now much more symptomatic. She is anticoagulated.  CHADSVasc 5 (age 48, hypertension, CAD, gender).  We discussed the pros and cons single-chamber pacemaker implantation, went over the possible complications, overnight hospital observation, activity restrictions, wound care, etc. Eliquis: Well-tolerated no bleeding complications HBP well controlled. Avoid any vacations with negative chronotropic effects such as beta blockers ILR: at end of service.  We will remove at the time of pacemaker implantation. CAD - Asymptomatic. > 6 years since last revascularization procedure, about 3 years ago cath was OK HLP: Excellent LDL-C on statin.   Medication Adjustments/Labs and Tests Ordered: Current medicines are reviewed at length with the patient today.  Concerns regarding medicines are outlined above.  Medication changes, Labs and Tests ordered today are listed below. Patient Instructions    You are scheduled for: Permanent Transvenous Pacemaker on Wednesday, May 22nd, 2019 with Dr Royann Shivers.  Please arrive at the [facility] Entrance "A" of [facility] ([address] [place]) at 1:30 pm on the day of your procedure.  1. You may have breakfast the morning of your procedure but nothing to eat after 8:00 am. 2.  3. Complete labwork on Monday, May 20th 2019. We have a lab station located within our office if we are convenient for you. You do not need an appointment. In our lobby, to the right of check-in, there is a podium where you sign-in for labs. You do not have to be fasting.  4. You may continue your  current medications except: HOLD Eliquis for 48 hours prior to your procedure. Last dose will be Monday morning.  5. Plan for an overnight  stay.  6. Bring your insurance cards and a list of your current medications.  7. Wash your chest and neck with the surgical scrub provided the evening before and the morning of your procedure.  Rinse well. See "Preparing for Surgery"  If you have ANY questions after you get home, please call the office [phone].  * Special note:  Every effort is made to have your procedure done on time.  Occasionally there are emergencies that present themselves at the hospital that may cause delays.  Please be patient if a delay does occur.   Preparing for Surgery  Before surgery, you can play an important role. Because skin is not sterile, your skin needs to be as free of germs as possible. You can reduce the number of germs on your skin by washing with CHG (chlorhexidine gluconate) Soap before surgery. CHG is an antiseptic cleaner which kills germs and bonds with the skin to continue killing germs even after washing.  Please do not use if you have an allergy to CHG or antibacterial soaps. If your skin becomes reddened/irritated, STOP using the CHG.  DO NOT SHAVE (including legs and underarms) for at least 48 hours prior to first CHG shower. It is OK to shave your face.  Please follow these instructions carefully:  1. Shower the night before surgery and the morning of surgery with CHG Soap.  2. If you chose to wash your hair, wash your hair first as usual with your normal shampoo/conditioner.  3. After you shampoo/condition, rinse you hair and body thoroughly to remove shampoo/conditioner.  4. Use CHG as you would any other liquid soap. You can apply CHG directly to the skin and wash gently with a loofah or a clean washcloth.  5. Apply the CHG Soap to your body ONLY FROM THE NECK DOWN. Do not use on open wounds or open sores. Avoid contact with your eyes, ears,  mouth, and genitals (private parts). Wash genitals (private part) with your normal soap.  6. Wash thoroughly, paying special attention to the area where your surgery will be performed.  7. Thoroughly rinse your body with warm water from the neck down.  8. DO NOT shower/wash with your normal soap after using and rinsing off the CHG Soap.  9. Pat yourself dry with a clean towel.  10. Wear clean pajamas to bed.  11. Place clean sheets on your bed the night of your first shower and do not sleep with pets.  Day of Surgery:  Shower with the CHG Soap following the instructions listed above. DO NOT apply deodorants or lotions. Please wear clean clothes to the hospital/surgery center.     Signed, Thurmon Fair, MD  06/27/2017 1:12 PM    [facility] [address] [place], [place], [place]  [address] Phone: [phone]; Fax: [phone]

## 2017-07-10 ENCOUNTER — Other Ambulatory Visit: Payer: Self-pay

## 2017-07-10 ENCOUNTER — Emergency Department (HOSPITAL_COMMUNITY): Payer: Medicare Other

## 2017-07-10 ENCOUNTER — Encounter (HOSPITAL_COMMUNITY): Payer: Self-pay

## 2017-07-10 ENCOUNTER — Emergency Department (HOSPITAL_COMMUNITY)
Admission: EM | Admit: 2017-07-10 | Discharge: 2017-07-10 | Disposition: A | Discharge status: Home / Self Care | Payer: Medicare Other | Attending: Emergency Medicine | Admitting: Emergency Medicine

## 2017-07-10 DIAGNOSIS — M62838 Other muscle spasm: Secondary | ICD-10-CM | POA: Insufficient documentation

## 2017-07-10 DIAGNOSIS — I2510 Atherosclerotic heart disease of native coronary artery without angina pectoris: Secondary | ICD-10-CM | POA: Diagnosis not present

## 2017-07-10 DIAGNOSIS — M542 Cervicalgia: Secondary | ICD-10-CM | POA: Diagnosis present

## 2017-07-10 DIAGNOSIS — F17290 Nicotine dependence, other tobacco product, uncomplicated: Secondary | ICD-10-CM | POA: Insufficient documentation

## 2017-07-10 DIAGNOSIS — Z79899 Other long term (current) drug therapy: Secondary | ICD-10-CM | POA: Insufficient documentation

## 2017-07-10 DIAGNOSIS — I129 Hypertensive chronic kidney disease with stage 1 through stage 4 chronic kidney disease, or unspecified chronic kidney disease: Secondary | ICD-10-CM | POA: Diagnosis not present

## 2017-07-10 DIAGNOSIS — Z951 Presence of aortocoronary bypass graft: Secondary | ICD-10-CM | POA: Diagnosis not present

## 2017-07-10 DIAGNOSIS — R51 Headache: Secondary | ICD-10-CM | POA: Insufficient documentation

## 2017-07-10 DIAGNOSIS — Z96649 Presence of unspecified artificial hip joint: Secondary | ICD-10-CM | POA: Insufficient documentation

## 2017-07-10 DIAGNOSIS — N183 Chronic kidney disease, stage 3 (moderate): Secondary | ICD-10-CM | POA: Insufficient documentation

## 2017-07-10 LAB — CBC WITH DIFFERENTIAL/PLATELET
ABS IMMATURE GRANULOCYTES: 0 10*3/uL (ref 0.0–0.1)
Basophils Absolute: 0 10*3/uL (ref 0.0–0.1)
Basophils Relative: 0 %
Eosinophils Absolute: 0 10*3/uL (ref 0.0–0.7)
Eosinophils Relative: 0 %
HEMATOCRIT: 38.1 % (ref 36.0–46.0)
HEMOGLOBIN: 12.3 g/dL (ref 12.0–15.0)
IMMATURE GRANULOCYTES: 0 %
LYMPHS ABS: 1.1 10*3/uL (ref 0.7–4.0)
LYMPHS PCT: 19 %
MCH: 28.3 pg (ref 26.0–34.0)
MCHC: 32.3 g/dL (ref 30.0–36.0)
MCV: 87.6 fL (ref 78.0–100.0)
Monocytes Absolute: 0.7 10*3/uL (ref 0.1–1.0)
Monocytes Relative: 12 %
NEUTROS ABS: 3.8 10*3/uL (ref 1.7–7.7)
NEUTROS PCT: 69 %
Platelets: 172 10*3/uL (ref 150–400)
RBC: 4.35 MIL/uL (ref 3.87–5.11)
RDW: 13.6 % (ref 11.5–15.5)
WBC: 5.6 10*3/uL (ref 4.0–10.5)

## 2017-07-10 LAB — I-STAT CHEM 8, ED
BUN: 17 mg/dL (ref 6–20)
CHLORIDE: 105 mmol/L (ref 101–111)
CREATININE: 1 mg/dL (ref 0.44–1.00)
Calcium, Ion: 1.31 mmol/L (ref 1.15–1.40)
GLUCOSE: 100 mg/dL — AB (ref 65–99)
HEMATOCRIT: 38 % (ref 36.0–46.0)
HEMOGLOBIN: 12.9 g/dL (ref 12.0–15.0)
POTASSIUM: 4 mmol/L (ref 3.5–5.1)
Sodium: 139 mmol/L (ref 135–145)
TCO2: 24 mmol/L (ref 22–32)

## 2017-07-10 LAB — I-STAT TROPONIN, ED: Troponin i, poc: 0.02 ng/mL (ref 0.00–0.08)

## 2017-07-10 MED ORDER — IOPAMIDOL (ISOVUE-370) INJECTION 76%
50.0000 mL | Freq: Once | INTRAVENOUS | Status: AC | PRN
  Administered 2017-07-10: 50 mL via INTRAVENOUS

## 2017-07-10 MED ORDER — METHOCARBAMOL 1000 MG/10ML IJ SOLN
1000.0000 mg | Freq: Once | INTRAVENOUS | Status: AC
  Administered 2017-07-10: 1000 mg via INTRAVENOUS
  Filled 2017-07-10: qty 10

## 2017-07-10 MED ORDER — METHOCARBAMOL 1000 MG/10ML IJ SOLN: 1000.0000 mg | Freq: Once | INTRAMUSCULAR | Status: DC

## 2017-07-10 MED ORDER — IOPAMIDOL (ISOVUE-370) INJECTION 76%
INTRAVENOUS | Status: AC
  Filled 2017-07-10: qty 50

## 2017-07-10 MED ORDER — PREDNISONE 20 MG PO TABS
60.0000 mg | ORAL_TABLET | Freq: Once | ORAL | Status: AC
  Administered 2017-07-10: 60 mg via ORAL
  Filled 2017-07-10: qty 3

## 2017-07-10 MED ORDER — METHOCARBAMOL 500 MG PO TABS: 500.0000 mg | ORAL_TABLET | Freq: Two times a day (BID) | ORAL | 0 refills | Status: DC

## 2017-07-10 MED ORDER — KETOROLAC TROMETHAMINE 30 MG/ML IJ SOLN
15.0000 mg | Freq: Once | INTRAMUSCULAR | Status: AC
  Administered 2017-07-10: 15 mg via INTRAVENOUS
  Filled 2017-07-10: qty 1

## 2017-07-10 MED ORDER — DICLOFENAC SODIUM 1 % TD GEL: 4.0000 g | Freq: Four times a day (QID) | TRANSDERMAL | 0 refills | Status: DC

## 2017-07-10 NOTE — ED Notes (Signed)
Heat pack applied to back of neck per MD request

## 2017-07-10 NOTE — ED Provider Notes (Signed)
[facility] EMERGENCY DEPARTMENT Provider Note   CSN: [id number] Arrival date & time: 07/10/17  0203     History   Chief Complaint Chief Complaint  Patient presents with  . Neck Pain  . Back Pain  . Headache    HPI Stephanie Atkinson is a 84 y.o. female.  The history is provided by the patient. No language interpreter was used.  Neck Pain   This is a new problem. The current episode started more than 1 week ago. The problem occurs constantly. The problem has not changed since onset.The pain is associated with nothing. There has been no fever. The pain is present in the occipital region. The quality of the pain is described as stabbing. The pain is severe. The symptoms are aggravated by bending, twisting and position. Pertinent negatives include no photophobia, no visual change, no chest pain, no syncope, no numbness, no weight loss, no headaches, no bowel incontinence, no bladder incontinence, no leg pain, no paresis, no tingling and no weakness. Treatments tried: tramadol. The treatment provided no relief.    Past Medical History:  Diagnosis Date  . Chronic kidney disease, stage 3 (HCC)   . Coronary artery disease   . Hx of CABG 2006   LIMA-LAD, free RIMA-PDA, Radial-OM1-OM2  . Hyperlipidemia   . Hypertension   . Hypothyroid   . MI, old       . PAF (paroxysmal atrial fibrillation) (HCC)    was on amio, d/c'd due to prolonged PR interval, rate control  . Presence of stent in right coronary artery 07/12   RIMA-PDA occluded, DES stent placed  . Syncope 07/12   bradycardic, beta blocker decreased, also felt to be dehydrated    Patient Active Problem List   Diagnosis Date Noted  . Atrial fibrillation with slow ventricular response (HCC) 06/27/2017  . Hypercholesterolemia 06/27/2017  . Symptomatic bradycardia 06/27/2017  . Chronic pain of right knee 05/17/2016  . De Quervain's tenosynovitis 05/17/2016  . CAD (coronary artery disease) of artery bypass graft  02/13/2015  . Encounter for loop recorder check 02/01/2014  . Accelerated junctional rhythm 05/04/2013  . Dehydration 10/14/2012  . Syncope and collapse 10/13/2012  . History of first degree atrioventricular block 10/11/2012  . Long term (current) use of anticoagulants 10/06/2012  . AVB - beta blocker and Amiodarone stopped 07/10/2012  . Hypertension   . Hx of CABG X 4 3/06-    . Dyslipidemia   . RCA DES placed 7/12- patent 8/14   . Permanent atrial fibrillation (HCC)   . Chronic kidney disease, stage 3 (HCC)     Past Surgical History:  Procedure Laterality Date  . CARDIAC CATHETERIZATION  [id number]   dr. Onalee Hua harding, revealing a atretic bypass to her right with patent distal RCA stent  . CAROTID STENT     2012  . CORONARY ARTERY BYPASS GRAFT  2006   LIMA-LAD, free RIMA-PDA, Radial-OM1-OM2  . DOPPLER ECHOCARDIOGRAPHY  [id number]   mild asymmetric left ventricular hypertrophy, left ventricular systolic function is low normal, ejection fraction = 50-55%, the LA is moderate dilated, the RA is mildly dilated, no significant valvular disease  . JOINT REPLACEMENT     hip replacement  . LEFT HEART CATHETERIZATION WITH CORONARY/GRAFT ANGIOGRAM N/A 10/12/2012   Procedure: LEFT HEART CATHETERIZATION WITH Isabel Caprice;  Surgeon: Marykay Lex, MD;  Location: [facility] CATH LAB;  Service: Cardiovascular;  Laterality: N/A;  . LOOP RECORDER IMPLANT N/A 10/15/2012   Procedure: LOOP RECORDER IMPLANT;  Surgeon: Thurmon Fair, MD;  Location: [facility] CATH LAB;  Service: Cardiovascular;  Laterality: N/A;  . NM MYOVIEW LTD  [id number]   post stress left ventricle is normal in size, post stress ejection fraction is 67% global left ventricular systolic function is normal, normal myocardial perfusion study, abnormal myocardial perfusion study, low risk scan  . THYROIDECTOMY       OB History   None      Home Medications    Prior to Admission medications   Medication Sig Start Date End Date Taking?  Authorizing Provider  acetaminophen (TYLENOL) 325 MG tablet Take 650 mg by mouth every 4 (four) hours as needed. For pain   Yes [provider]  amLODipine (NORVASC) 5 MG tablet Take 5 mg by mouth daily. 06/22/12  Yes [provider]  atorvastatin (LIPITOR) 80 MG tablet Take 80 mg by mouth daily.   Yes [provider]  clobetasol cream (TEMOVATE) 0.05 % Apply 1 application topically as needed (to legs).  07/12/16  Yes [provider]  ELIQUIS 5 MG TABS tablet TAKE 1 TABLET BY MOUTH TWICE A DAY 02/04/17  Yes Runell Gess, MD  furosemide (LASIX) 40 MG tablet Take 1 tablet (40 mg total) by mouth daily. 11/09/14  Yes Barrett, Joline Salt, PA-C  irbesartan (AVAPRO) 300 MG tablet Take 300 mg by mouth daily. 12/05/16  Yes [provider]  levothyroxine (SYNTHROID, LEVOTHROID) 50 MCG tablet Take 50 mcg by mouth daily.   Yes [provider]  meclizine (ANTIVERT) 25 MG tablet Take 1 tablet (25 mg total) by mouth 3 (three) times daily as needed for dizziness. 11/30/16  Yes Vanetta Mulders, MD  nitroGLYCERIN (NITROSTAT) 0.4 MG SL tablet Place 0.4 mg under the tongue every 5 (five) minutes as needed. For chest pain   Yes [provider]  traMADol (ULTRAM) 50 MG tablet Take 50 mg by mouth 2 (two) times daily.    Yes [provider]  valsartan (DIOVAN) 320 MG tablet Take 1 tablet (320 mg total) by mouth daily. 07/29/16  Yes Runell Gess, MD    Family History Family History  Problem Relation Age of Onset  . CAD Mother   . CAD Maternal Grandmother     Social History Social History   Tobacco Use  . Smoking status: Never Smoker  . Smokeless tobacco: Current User    Types: Snuff  Substance Use Topics  . Alcohol use: No  . Drug use: No     Allergies   Penicillins and Sulfa antibiotics   Review of Systems Review of Systems  Constitutional: Negative for diaphoresis, fever and weight loss.  Eyes: Negative for photophobia.    Respiratory: Negative for chest tightness and shortness of breath.   Cardiovascular: Negative for chest pain, palpitations, leg swelling and syncope.  Gastrointestinal: Negative for bowel incontinence.  Genitourinary: Negative for bladder incontinence.  Musculoskeletal: Positive for neck pain. Negative for joint swelling, myalgias and neck stiffness.  Neurological: Negative for tingling, tremors, seizures, syncope, facial asymmetry, speech difficulty, weakness, light-headedness, numbness and headaches.  All other systems reviewed and are negative.    Physical Exam Updated Vital Signs BP (!) 164/61   Pulse (!) 50   Temp 99 F (37.2 C) (Oral)   Resp (!) 27   Ht  (1.6 m)   Wt 69.9 kg (154 lb)   SpO2 97%   BMI 27.28 kg/m   Physical Exam  Constitutional: She is oriented to person, place, and time. She appears  well-developed and well-nourished. No distress.  HENT:  Head: Normocephalic and atraumatic.  Mouth/Throat: Oropharynx is clear and moist. No oropharyngeal exudate.  Eyes: Pupils are equal, round, and reactive to light. Conjunctivae and EOM are normal.  Neck: Trachea normal and phonation normal. No JVD present. Muscular tenderness present. No tracheal tenderness and no spinous process tenderness present. No neck rigidity. No tracheal deviation present. No Brudzinski's sign and no Kernig's sign noted.  Pain with movement, spasm palpable  Cardiovascular: Normal rate, regular rhythm, normal heart sounds and intact distal pulses.  Pulmonary/Chest: Effort normal and breath sounds normal. No stridor. No respiratory distress. She has no wheezes. She has no rales.  Abdominal: Soft. Bowel sounds are normal. She exhibits no mass. There is no tenderness. There is no rebound and no guarding.  Musculoskeletal: Normal range of motion. She exhibits no tenderness.       Cervical back: She exhibits spasm. She exhibits no bony tenderness, no swelling, no edema, no deformity, no laceration and  normal pulse.  Lymphadenopathy:    She has no cervical adenopathy.  Neurological: She is alert and oriented to person, place, and time. She displays normal reflexes.  Skin: Skin is warm and dry. Capillary refill takes less than 2 seconds.  Psychiatric: She has a normal mood and affect.     ED Treatments / Results  Labs (all labs ordered are listed, but only abnormal results are displayed) Results for orders placed or performed during the hospital encounter of 07/10/17  CBC with Differential/Platelet  Result Value Ref Range   WBC 5.6 4.0 - 10.5 K/uL   RBC 4.35 3.87 - 5.11 MIL/uL   Hemoglobin 12.3 12.0 - 15.0 g/dL   HCT 16.1 09.6 - 04.5 %   MCV 87.6 78.0 - 100.0 fL   MCH 28.3 26.0 - 34.0 pg   MCHC 32.3 30.0 - 36.0 g/dL   RDW 40.9 81.1 - 91.4 %   Platelets 172 150 - 400 K/uL   Neutrophils Relative % 69 %   Neutro Abs 3.8 1.7 - 7.7 K/uL   Lymphocytes Relative 19 %   Lymphs Abs 1.1 0.7 - 4.0 K/uL   Monocytes Relative 12 %   Monocytes Absolute 0.7 0.1 - 1.0 K/uL   Eosinophils Relative 0 %   Eosinophils Absolute 0.0 0.0 - 0.7 K/uL   Basophils Relative 0 %   Basophils Absolute 0.0 0.0 - 0.1 K/uL   Immature Granulocytes 0 %   Abs Immature Granulocytes 0.0 0.0 - 0.1 K/uL  I-stat chem 8, ed  Result Value Ref Range   Sodium 139 135 - 145 mmol/L   Potassium 4.0 3.5 - 5.1 mmol/L   Chloride 105 101 - 111 mmol/L   BUN 17 6 - 20 mg/dL   Creatinine, Ser 7.82 0.44 - 1.00 mg/dL   Glucose, Bld 956 (H) 65 - 99 mg/dL   Calcium, Ion 2.13 0.86 - 1.40 mmol/L   TCO2 24 22 - 32 mmol/L   Hemoglobin 12.9 12.0 - 15.0 g/dL   HCT 57.8 46.9 - 62.9 %  I-stat troponin, ED  Result Value Ref Range   Troponin i, poc 0.02 0.00 - 0.08 ng/mL   Comment 3           Dg Chest 2 View  Result Date: 07/10/2017 CLINICAL DATA:  Headache neck and back pain EXAM: CHEST - 2 VIEW COMPARISON:  11/30/2016 FINDINGS: Post sternotomy changes. Recording device over the left lower chest. Marked cardiomegaly. Mild central  vascular congestion. No pleural  effusion or focal consolidation. Aortic atherosclerosis. No pneumothorax. IMPRESSION: Marked cardiomegaly with mild central vascular congestion. Electronically Signed   By: Jasmine Pang M.D.   On: 07/10/2017 03:22   Ct Head Wo Contrast  Result Date: 07/10/2017 CLINICAL DATA:  84 y/o F; three days of neck pain. History of hypertension, syncope, thyroidectomy, and carotid stent. EXAM: CT HEAD WITHOUT CONTRAST CT CERVICAL SPINE WITHOUT CONTRAST TECHNIQUE: Multidetector CT imaging of the head and cervical spine was performed following the standard protocol without intravenous contrast. Multiplanar CT image reconstructions of the cervical spine were also generated. COMPARISON:  06/30/2017 CT angiogram of the neck. 12/26/2016 CT of the head and cervical spine. FINDINGS: CT HEAD FINDINGS Brain: No evidence of acute infarction, hemorrhage, hydrocephalus, extra-axial collection or mass lesion/mass effect. Stable small chronic lacunar infarcts within left putamen and left thalamus. Stable small chronic infarct in the right superior cerebellum. Stable chronic microvascular ischemic changes and parenchymal volume loss of the brain. Vascular: Calcific atherosclerosis of carotid siphons and vertebral arteries. Skull: Normal. Negative for fracture or focal lesion. Sinuses/Orbits: No acute finding. Other: None. CT CERVICAL SPINE FINDINGS Alignment: Stable C4-5 and C5-6 grade 1 anterolisthesis. Skull base and vertebrae: No acute fracture. No primary bone lesion or focal pathologic process. Soft tissues and spinal canal: No prevertebral fluid or swelling. No visible canal hematoma. Disc levels: Stable severe productive changes of the anterior C1-2 articulation and prominent right-sided facet arthropathy. No high-grade bony canal stenosis. Upper chest: Negative. Other: Stable prominent left posterior lower cervical lymph node measuring up to 11 mm. IMPRESSION: 1. No acute intracranial abnormality  or calvarial fracture. 2. No acute fracture or dislocation of cervical spine. 3. Stable chronic microvascular ischemic changes and parenchymal volume loss of the brain. 4. Stable spondylosis of the cervical spine with grade 1 C4-5 and C5-6 anterolisthesis. Electronically Signed   By: Mitzi Hansen M.D.   On: 07/10/2017 06:40   Ct Angio Neck W And/or Wo Contrast  Result Date: 07/10/2017 CLINICAL DATA:  84 y/o F; 3 days of neck pain. History of hypertension, syncope, thyroidectomy, and carotid stent. EXAM: CT ANGIOGRAPHY NECK TECHNIQUE: Multidetector CT imaging of the neck was performed using the standard protocol during bolus administration of intravenous contrast. Multiplanar CT image reconstructions and MIPs were obtained to evaluate the vascular anatomy. Carotid stenosis measurements (when applicable) are obtained utilizing NASCET criteria, using the distal internal carotid diameter as the denominator. CONTRAST:  50mL ISOVUE-370 IOPAMIDOL (ISOVUE-370) INJECTION 76% COMPARISON:  07/10/2017 CT head and cervical spine. 12/26/2016 CT head and cervical spine. FINDINGS: Aortic arch: Standard branching. Imaged portion shows no evidencece of aneurysm or dissection. No significant stenosis of the major arch vessel origins. Mild calcific atherosclerosis. Right carotid system: No evidence of dissection, stenosis (50% or greater) or occlusion. Calcific atherosclerosis of the carotid siphons with mild less than 50% proximal ICA stenosis. Left carotid system: No evidence of dissection, stenosis (50% or greater) or occlusion. Vertebral arteries: Left dominant, diminutive right vertebral artery. No evidence of dissection, stenosis (50% or greater) or occlusion. Skeleton: C4-5 and C5-6 grade 1 anterolisthesis. Severe productive changes of the anterior C1-2 articulation and prominent right-sided facet arthropathy. No high-grade bony canal stenosis. Other neck: Calcified mediastinal lymph nodes compatible with prior  granulomatous disease. Upper chest: Negative. IMPRESSION: 1. Patent carotid and vertebral arteries. No dissection, aneurysm, or hemodynamically significant stenosis utilizing NASCET criteria. 2. Mild calcific atherosclerosis of the aorta and bilateral carotid bifurcations. 3. Mild less than 50% proximal right ICA stenosis. 4. Calcified mediastinal  lymph nodes compatible with prior granulomatous disease. Electronically Signed   By: Mitzi Hansen M.D.   On: 07/10/2017 06:41   Ct Cervical Spine Wo Contrast  Result Date: 07/10/2017 CLINICAL DATA:  84 y/o F; three days of neck pain. History of hypertension, syncope, thyroidectomy, and carotid stent. EXAM: CT HEAD WITHOUT CONTRAST CT CERVICAL SPINE WITHOUT CONTRAST TECHNIQUE: Multidetector CT imaging of the head and cervical spine was performed following the standard protocol without intravenous contrast. Multiplanar CT image reconstructions of the cervical spine were also generated. COMPARISON:  06/30/2017 CT angiogram of the neck. 12/26/2016 CT of the head and cervical spine. FINDINGS: CT HEAD FINDINGS Brain: No evidence of acute infarction, hemorrhage, hydrocephalus, extra-axial collection or mass lesion/mass effect. Stable small chronic lacunar infarcts within left putamen and left thalamus. Stable small chronic infarct in the right superior cerebellum. Stable chronic microvascular ischemic changes and parenchymal volume loss of the brain. Vascular: Calcific atherosclerosis of carotid siphons and vertebral arteries. Skull: Normal. Negative for fracture or focal lesion. Sinuses/Orbits: No acute finding. Other: None. CT CERVICAL SPINE FINDINGS Alignment: Stable C4-5 and C5-6 grade 1 anterolisthesis. Skull base and vertebrae: No acute fracture. No primary bone lesion or focal pathologic process. Soft tissues and spinal canal: No prevertebral fluid or swelling. No visible canal hematoma. Disc levels: Stable severe productive changes of the anterior C1-2  articulation and prominent right-sided facet arthropathy. No high-grade bony canal stenosis. Upper chest: Negative. Other: Stable prominent left posterior lower cervical lymph node measuring up to 11 mm. IMPRESSION: 1. No acute intracranial abnormality or calvarial fracture. 2. No acute fracture or dislocation of cervical spine. 3. Stable chronic microvascular ischemic changes and parenchymal volume loss of the brain. 4. Stable spondylosis of the cervical spine with grade 1 C4-5 and C5-6 anterolisthesis. Electronically Signed   By: Mitzi Hansen M.D.   On: 07/10/2017 06:40    EKG EKG Interpretation  Date/Time:  Thursday Jul 10 2017 03:54:46 EDT Ventricular Rate:  49 PR Interval:    QRS Duration: 94 QT Interval:  464 QTC Calculation: 419 R Axis:   62 Text Interpretation:  Atrial fibrillation Probable LVH with secondary repol abnrm Confirmed by Fernanda Twaddell ([id number]) on 07/10/2017 4:42:29 AM   Radiology Dg Chest 2 View  Result Date: 07/10/2017 CLINICAL DATA:  Headache neck and back pain EXAM: CHEST - 2 VIEW COMPARISON:  11/30/2016 FINDINGS: Post sternotomy changes. Recording device over the left lower chest. Marked cardiomegaly. Mild central vascular congestion. No pleural effusion or focal consolidation. Aortic atherosclerosis. No pneumothorax. IMPRESSION: Marked cardiomegaly with mild central vascular congestion. Electronically Signed   By: Jasmine Pang M.D.   On: 07/10/2017 03:22   Ct Head Wo Contrast  Result Date: 07/10/2017 CLINICAL DATA:  84 y/o F; three days of neck pain. History of hypertension, syncope, thyroidectomy, and carotid stent. EXAM: CT HEAD WITHOUT CONTRAST CT CERVICAL SPINE WITHOUT CONTRAST TECHNIQUE: Multidetector CT imaging of the head and cervical spine was performed following the standard protocol without intravenous contrast. Multiplanar CT image reconstructions of the cervical spine were also generated. COMPARISON:  06/30/2017 CT angiogram of the neck.  12/26/2016 CT of the head and cervical spine. FINDINGS: CT HEAD FINDINGS Brain: No evidence of acute infarction, hemorrhage, hydrocephalus, extra-axial collection or mass lesion/mass effect. Stable small chronic lacunar infarcts within left putamen and left thalamus. Stable small chronic infarct in the right superior cerebellum. Stable chronic microvascular ischemic changes and parenchymal volume loss of the brain. Vascular: Calcific atherosclerosis of carotid siphons and vertebral arteries. Skull:  Normal. Negative for fracture or focal lesion. Sinuses/Orbits: No acute finding. Other: None. CT CERVICAL SPINE FINDINGS Alignment: Stable C4-5 and C5-6 grade 1 anterolisthesis. Skull base and vertebrae: No acute fracture. No primary bone lesion or focal pathologic process. Soft tissues and spinal canal: No prevertebral fluid or swelling. No visible canal hematoma. Disc levels: Stable severe productive changes of the anterior C1-2 articulation and prominent right-sided facet arthropathy. No high-grade bony canal stenosis. Upper chest: Negative. Other: Stable prominent left posterior lower cervical lymph node measuring up to 11 mm. IMPRESSION: 1. No acute intracranial abnormality or calvarial fracture. 2. No acute fracture or dislocation of cervical spine. 3. Stable chronic microvascular ischemic changes and parenchymal volume loss of the brain. 4. Stable spondylosis of the cervical spine with grade 1 C4-5 and C5-6 anterolisthesis. Electronically Signed   By: Mitzi Hansen M.D.   On: 07/10/2017 06:40   Ct Angio Neck W And/or Wo Contrast  Result Date: 07/10/2017 CLINICAL DATA:  84 y/o F; 3 days of neck pain. History of hypertension, syncope, thyroidectomy, and carotid stent. EXAM: CT ANGIOGRAPHY NECK TECHNIQUE: Multidetector CT imaging of the neck was performed using the standard protocol during bolus administration of intravenous contrast. Multiplanar CT image reconstructions and MIPs were obtained to  evaluate the vascular anatomy. Carotid stenosis measurements (when applicable) are obtained utilizing NASCET criteria, using the distal internal carotid diameter as the denominator. CONTRAST:  50mL ISOVUE-370 IOPAMIDOL (ISOVUE-370) INJECTION 76% COMPARISON:  07/10/2017 CT head and cervical spine. 12/26/2016 CT head and cervical spine. FINDINGS: Aortic arch: Standard branching. Imaged portion shows no evidencece of aneurysm or dissection. No significant stenosis of the major arch vessel origins. Mild calcific atherosclerosis. Right carotid system: No evidence of dissection, stenosis (50% or greater) or occlusion. Calcific atherosclerosis of the carotid siphons with mild less than 50% proximal ICA stenosis. Left carotid system: No evidence of dissection, stenosis (50% or greater) or occlusion. Vertebral arteries: Left dominant, diminutive right vertebral artery. No evidence of dissection, stenosis (50% or greater) or occlusion. Skeleton: C4-5 and C5-6 grade 1 anterolisthesis. Severe productive changes of the anterior C1-2 articulation and prominent right-sided facet arthropathy. No high-grade bony canal stenosis. Other neck: Calcified mediastinal lymph nodes compatible with prior granulomatous disease. Upper chest: Negative. IMPRESSION: 1. Patent carotid and vertebral arteries. No dissection, aneurysm, or hemodynamically significant stenosis utilizing NASCET criteria. 2. Mild calcific atherosclerosis of the aorta and bilateral carotid bifurcations. 3. Mild less than 50% proximal right ICA stenosis. 4. Calcified mediastinal lymph nodes compatible with prior granulomatous disease. Electronically Signed   By: Mitzi Hansen M.D.   On: 07/10/2017 06:41   Ct Cervical Spine Wo Contrast  Result Date: 07/10/2017 CLINICAL DATA:  84 y/o F; three days of neck pain. History of hypertension, syncope, thyroidectomy, and carotid stent. EXAM: CT HEAD WITHOUT CONTRAST CT CERVICAL SPINE WITHOUT CONTRAST TECHNIQUE:  Multidetector CT imaging of the head and cervical spine was performed following the standard protocol without intravenous contrast. Multiplanar CT image reconstructions of the cervical spine were also generated. COMPARISON:  06/30/2017 CT angiogram of the neck. 12/26/2016 CT of the head and cervical spine. FINDINGS: CT HEAD FINDINGS Brain: No evidence of acute infarction, hemorrhage, hydrocephalus, extra-axial collection or mass lesion/mass effect. Stable small chronic lacunar infarcts within left putamen and left thalamus. Stable small chronic infarct in the right superior cerebellum. Stable chronic microvascular ischemic changes and parenchymal volume loss of the brain. Vascular: Calcific atherosclerosis of carotid siphons and vertebral arteries. Skull: Normal. Negative for fracture or focal lesion. Sinuses/Orbits:  No acute finding. Other: None. CT CERVICAL SPINE FINDINGS Alignment: Stable C4-5 and C5-6 grade 1 anterolisthesis. Skull base and vertebrae: No acute fracture. No primary bone lesion or focal pathologic process. Soft tissues and spinal canal: No prevertebral fluid or swelling. No visible canal hematoma. Disc levels: Stable severe productive changes of the anterior C1-2 articulation and prominent right-sided facet arthropathy. No high-grade bony canal stenosis. Upper chest: Negative. Other: Stable prominent left posterior lower cervical lymph node measuring up to 11 mm. IMPRESSION: 1. No acute intracranial abnormality or calvarial fracture. 2. No acute fracture or dislocation of cervical spine. 3. Stable chronic microvascular ischemic changes and parenchymal volume loss of the brain. 4. Stable spondylosis of the cervical spine with grade 1 C4-5 and C5-6 anterolisthesis. Electronically Signed   By: Mitzi Hansen M.D.   On: 07/10/2017 06:40    Procedures Procedures (including critical care time)  Medications Ordered in ED Medications  iopamidol (ISOVUE-370) 76 % injection (has no  administration in time range)  methocarbamol (ROBAXIN) 1,000 mg in dextrose 5 % 50 mL IVPB (1,000 mg Intravenous New Bag/Given 07/10/17 0640)  iopamidol (ISOVUE-370) 76 % injection 50 mL (50 mLs Intravenous Contrast Given 07/10/17 0604)  ketorolac (TORADOL) 30 MG/ML injection 15 mg (15 mg Intravenous Given 07/10/17 0703)  predniSONE (DELTASONE) tablet 60 mg (60 mg Oral Given 07/10/17 0710)     Final Clinical Impressions(s) / ED Diagnoses   Muscle spasm of the neck:     Return for weakness, numbness, changes in vision or speech, fevers >100.4 unrelieved by medication, shortness of breath, intractable vomiting, or diarrhea, abdominal pain, Inability to tolerate liquids or food, cough, altered mental status or any concerns. No signs of systemic illness or infection. The patient is nontoxic-appearing on exam and vital signs are within normal limits.   I have reviewed the triage vital signs and the nursing notes. Pertinent labs &imaging results that were available during my care of the patient were reviewed by me and considered in my medical decision making (see chart for details).  After history, exam, and medical workup I feel the patient has been appropriately medically screened and is safe for discharge home. Pertinent diagnoses were discussed with the patient. Patient was given return precautions.     Summerlyn Fickel, MD 07/10/17 [phone]

## 2017-07-10 NOTE — ED Triage Notes (Signed)
Pt coming from home due to neck, mid back pain with a headache that has been going on for 3 days. Pt states that if she lifts her head up or down it feels like it's going to snap. Pt states that she has full sensation in all extremities, and has control to bowel and bladder. Pt axox4.

## 2017-07-14 LAB — CBC
HEMATOCRIT: 39.4 % (ref 34.0–46.6)
HEMOGLOBIN: 13.5 g/dL (ref 11.1–15.9)
MCH: 29.7 pg (ref 26.6–33.0)
MCHC: 34.3 g/dL (ref 31.5–35.7)
MCV: 87 fL (ref 79–97)
Platelets: 249 10*3/uL (ref 150–450)
RBC: 4.55 x10E6/uL (ref 3.77–5.28)
RDW: 15 % (ref 12.3–15.4)
WBC: 4.7 10*3/uL (ref 3.4–10.8)

## 2017-07-14 LAB — BASIC METABOLIC PANEL
BUN/Creatinine Ratio: 14 (ref 12–28)
BUN: 16 mg/dL (ref 10–36)
CALCIUM: 10.6 mg/dL — AB (ref 8.7–10.3)
CO2: 22 mmol/L (ref 20–29)
CREATININE: 1.16 mg/dL — AB (ref 0.57–1.00)
Chloride: 105 mmol/L (ref 96–106)
GFR calc non Af Amer: 41 mL/min/{1.73_m2} — ABNORMAL LOW (ref >59)
GFR, EST AFRICAN AMERICAN: 48 mL/min/{1.73_m2} — AB (ref >59)
Glucose: 106 mg/dL — ABNORMAL HIGH (ref 65–99)
POTASSIUM: 4.7 mmol/L (ref 3.5–5.2)
Sodium: 141 mmol/L (ref 134–144)

## 2017-07-15 ENCOUNTER — Other Ambulatory Visit: Payer: Self-pay

## 2017-07-15 DIAGNOSIS — R001 Bradycardia, unspecified: Secondary | ICD-10-CM

## 2017-07-16 ENCOUNTER — Encounter (HOSPITAL_COMMUNITY): Payer: Self-pay | Admitting: General Practice

## 2017-07-16 ENCOUNTER — Ambulatory Visit (HOSPITAL_COMMUNITY)
Admission: RE | Disposition: A | Discharge status: Home / Self Care | Payer: Self-pay | Source: Ambulatory Visit | Attending: Cardiovascular Disease

## 2017-07-16 ENCOUNTER — Ambulatory Visit (HOSPITAL_COMMUNITY)
Admission: RE | Admit: 2017-07-16 | Discharge: 2017-07-17 | Disposition: A | Discharge status: Home / Self Care | Payer: Medicare Other | Source: Ambulatory Visit | Attending: Cardiovascular Disease | Admitting: Cardiovascular Disease

## 2017-07-16 DIAGNOSIS — I4891 Unspecified atrial fibrillation: Secondary | ICD-10-CM

## 2017-07-16 DIAGNOSIS — I129 Hypertensive chronic kidney disease with stage 1 through stage 4 chronic kidney disease, or unspecified chronic kidney disease: Secondary | ICD-10-CM | POA: Insufficient documentation

## 2017-07-16 DIAGNOSIS — Z950 Presence of cardiac pacemaker: Secondary | ICD-10-CM | POA: Diagnosis present

## 2017-07-16 DIAGNOSIS — I4821 Permanent atrial fibrillation: Secondary | ICD-10-CM | POA: Diagnosis present

## 2017-07-16 DIAGNOSIS — Z7901 Long term (current) use of anticoagulants: Secondary | ICD-10-CM | POA: Insufficient documentation

## 2017-07-16 DIAGNOSIS — Z951 Presence of aortocoronary bypass graft: Secondary | ICD-10-CM | POA: Diagnosis not present

## 2017-07-16 DIAGNOSIS — Z09 Encounter for follow-up examination after completed treatment for conditions other than malignant neoplasm: Secondary | ICD-10-CM

## 2017-07-16 DIAGNOSIS — I25810 Atherosclerosis of coronary artery bypass graft(s) without angina pectoris: Secondary | ICD-10-CM | POA: Diagnosis not present

## 2017-07-16 DIAGNOSIS — Z955 Presence of coronary angioplasty implant and graft: Secondary | ICD-10-CM | POA: Insufficient documentation

## 2017-07-16 DIAGNOSIS — Z882 Allergy status to sulfonamides status: Secondary | ICD-10-CM | POA: Diagnosis not present

## 2017-07-16 DIAGNOSIS — I252 Old myocardial infarction: Secondary | ICD-10-CM | POA: Insufficient documentation

## 2017-07-16 DIAGNOSIS — E039 Hypothyroidism, unspecified: Secondary | ICD-10-CM | POA: Insufficient documentation

## 2017-07-16 DIAGNOSIS — Z880 Allergy status to penicillin: Secondary | ICD-10-CM | POA: Insufficient documentation

## 2017-07-16 DIAGNOSIS — I495 Sick sinus syndrome: Secondary | ICD-10-CM | POA: Diagnosis not present

## 2017-07-16 DIAGNOSIS — E7800 Pure hypercholesterolemia, unspecified: Secondary | ICD-10-CM | POA: Insufficient documentation

## 2017-07-16 DIAGNOSIS — R001 Bradycardia, unspecified: Secondary | ICD-10-CM | POA: Diagnosis present

## 2017-07-16 DIAGNOSIS — I482 Chronic atrial fibrillation: Secondary | ICD-10-CM | POA: Insufficient documentation

## 2017-07-16 DIAGNOSIS — N183 Chronic kidney disease, stage 3 (moderate): Secondary | ICD-10-CM | POA: Insufficient documentation

## 2017-07-16 HISTORY — DX: Presence of cardiac pacemaker: Z95.0

## 2017-07-16 HISTORY — PX: INSERT / REPLACE / REMOVE PACEMAKER: SUR710

## 2017-07-16 HISTORY — PX: LOOP RECORDER REMOVAL: EP1215

## 2017-07-16 HISTORY — PX: PACEMAKER IMPLANT: EP1218

## 2017-07-16 LAB — SURGICAL PCR SCREEN
MRSA, PCR: NEGATIVE
Staphylococcus aureus: NEGATIVE

## 2017-07-16 SURGERY — PACEMAKER IMPLANT

## 2017-07-16 MED ORDER — VANCOMYCIN HCL IN DEXTROSE 1-5 GM/200ML-% IV SOLN
1000.0000 mg | INTRAVENOUS | Status: AC
  Administered 2017-07-16: 1000 mg via INTRAVENOUS

## 2017-07-16 MED ORDER — MIDAZOLAM HCL 5 MG/5ML IJ SOLN
INTRAMUSCULAR | Status: DC | PRN
  Administered 2017-07-16 (×2): 1 mg via INTRAVENOUS

## 2017-07-16 MED ORDER — VANCOMYCIN HCL IN DEXTROSE 1-5 GM/200ML-% IV SOLN
INTRAVENOUS | Status: AC
  Filled 2017-07-16: qty 200

## 2017-07-16 MED ORDER — LIDOCAINE HCL (PF) 1 % IJ SOLN
INTRAMUSCULAR | Status: DC | PRN
  Administered 2017-07-16: 60 mL

## 2017-07-16 MED ORDER — VANCOMYCIN HCL IN DEXTROSE 1-5 GM/200ML-% IV SOLN: 1000.0000 mg | Freq: Two times a day (BID) | INTRAVENOUS | Status: DC

## 2017-07-16 MED ORDER — SODIUM CHLORIDE 0.9 % IV SOLN: 80.0000 mg | INTRAVENOUS | Status: DC

## 2017-07-16 MED ORDER — FENTANYL CITRATE (PF) 100 MCG/2ML IJ SOLN
INTRAMUSCULAR | Status: AC
  Filled 2017-07-16: qty 2

## 2017-07-16 MED ORDER — MIDAZOLAM HCL 5 MG/5ML IJ SOLN
INTRAMUSCULAR | Status: AC
  Filled 2017-07-16: qty 5

## 2017-07-16 MED ORDER — SODIUM CHLORIDE 0.9 % IV SOLN
INTRAVENOUS | Status: DC
  Administered 2017-07-16: 14:00:00 via INTRAVENOUS

## 2017-07-16 MED ORDER — FENTANYL CITRATE (PF) 100 MCG/2ML IJ SOLN
INTRAMUSCULAR | Status: DC | PRN
  Administered 2017-07-16 (×2): 25 ug via INTRAVENOUS

## 2017-07-16 MED ORDER — LIDOCAINE HCL (PF) 1 % IJ SOLN
INTRAMUSCULAR | Status: AC
  Filled 2017-07-16: qty 60

## 2017-07-16 MED ORDER — ACETAMINOPHEN 325 MG PO TABS
325.0000 mg | ORAL_TABLET | ORAL | Status: DC | PRN
  Administered 2017-07-17: 650 mg via ORAL
  Filled 2017-07-16: qty 2

## 2017-07-16 MED ORDER — HEPARIN (PORCINE) IN NACL 2-0.9 UNITS/ML
INTRAMUSCULAR | Status: AC | PRN
  Administered 2017-07-16: 500 mL

## 2017-07-16 MED ORDER — MUPIROCIN 2 % EX OINT
1.0000 "application " | TOPICAL_OINTMENT | Freq: Once | CUTANEOUS | Status: AC
  Administered 2017-07-16: 1 via TOPICAL
  Filled 2017-07-16: qty 22

## 2017-07-16 MED ORDER — MUPIROCIN 2 % EX OINT
TOPICAL_OINTMENT | CUTANEOUS | Status: AC
  Administered 2017-07-16: 1 via TOPICAL
  Filled 2017-07-16: qty 22

## 2017-07-16 MED ORDER — ONDANSETRON HCL 4 MG/2ML IJ SOLN: 4.0000 mg | Freq: Four times a day (QID) | INTRAMUSCULAR | Status: DC | PRN

## 2017-07-16 MED ORDER — CHLORHEXIDINE GLUCONATE 4 % EX LIQD
60.0000 mL | Freq: Once | CUTANEOUS | Status: DC
  Filled 2017-07-16: qty 60

## 2017-07-16 MED ORDER — SODIUM CHLORIDE 0.9 % IV SOLN
INTRAVENOUS | Status: AC
  Filled 2017-07-16: qty 2

## 2017-07-16 SURGICAL SUPPLY — 7 items
CABLE SURGICAL S-101-97-12 (CABLE) ×2 IMPLANT
IPG PACE AZUR XT SR MRI W1SR01 (Pacemaker) IMPLANT
LEAD CAPSURE NOVUS 5076-52CM (Lead) ×2 IMPLANT
PACE AZURE XT SR MRI W1SR01 (Pacemaker) ×3 IMPLANT
PAD DEFIB LIFELINK (PAD) ×2 IMPLANT
SHEATH CLASSIC 7F (SHEATH) ×2 IMPLANT
TRAY PACEMAKER INSERTION (PACKS) ×2 IMPLANT

## 2017-07-16 NOTE — Progress Notes (Signed)
PHARMACY NOTE:  ANTIMICROBIAL RENAL DOSAGE ADJUSTMENT  Current antimicrobial regimen includes a mismatch between antimicrobial dosage and estimated renal function.  As per policy approved by the Pharmacy & Therapeutics and Medical Executive Committees, the antimicrobial dosage will be adjusted accordingly.  Current antimicrobial dosage:  Vancomycin  IV at ~ 2am on 5/23 -  IV vancomycin given at ~ 4pm on 5/22  Indication: surgical prophylaxis post pacemaker  Renal Function:  Estimated Creatinine Clearance: 29.6 mL/min (A) (by C-G formula based on SCr of 1.16 mg/dL (H)).     Antimicrobial dosage has been changed to:  No further dosing is needed  Additional comments: Due to age and CrCl the dose of  will cover for the 24 hour period. No further dosing is needed   Thank you for allowing pharmacy to be a part of this patient's care.  Harland German, PharmD Clinical Pharmacist 07/16/2017 5:41 PM

## 2017-07-16 NOTE — Op Note (Signed)
Procedure report  Procedure performed:  1. Implantation of new single chamber permanent pacemaker 2. Fluoroscopy 3. Light sedation 4. Loop recorder explantation  Reason for procedure: Symptomatic bradycardia due to atrial fibrillation with slow ventricular response  Procedure performed by: Thurmon Fair, MD  Complications: None  Estimated blood loss: <10 mL  Medications administered during procedure: Vancomycin 1 g intravenously Lidocaine 1% 30 mL locally,  Fentanyl 50 mcg intravenously Versed 2 mg intravenously  During this procedure the patient is administered a total of Versed 2 mg and Fentanyl 50 mcg IV to achieve and maintain moderate conscious sedation.  The patient's heart rate, blood pressure, and oxygen saturation are monitored continuously during the procedure. The period of conscious sedation is 46 minutes, of which I was present face-to-face 100% of this time.  Device details: Generator Medtronic Azure XT SR model [id number] serial number [id number] H Right ventricular lead Medtronic [id number] serial number [id number]  Procedure details:  After the risks and benefits of the procedure were discussed the patient provided informed consent and was brought to the cardiac cath lab in the fasting state. The patient was prepped and draped in usual sterile fashion. Local anesthesia with 1% lidocaine was administered to to the left infraclavicular area. A 5-6 cm horizontal incision was made parallel with and 2-3 cm caudal to the left clavicle. Using electrocautery and blunt dissection a prepectoral pocket was created down to the level of the pectoralis major muscle fascia. The pocket was carefully inspected for hemostasis. An antibiotic-soaked sponge was placed in the pocket.  Under fluoroscopic guidance and using the modified Seldinger technique a single venipuncture was  performed to access the left subclavian vein. No difficulty was encountered accessing the vein.  A J-tip  guidewires was subsequently exchanged for a 16F [place] safe sheath.  Under fluoroscopic guidance the ventricular lead was advanced to level of the mid to apical right ventricular septum and thet active-fixation helix was deployed. Prominent current of injury was seen. Satisfactory pacing and sensing parameters were recorded. There was no evidence of diaphragmatic stimulation at maximum device output. The safe sheath was peeled away and the lead was secured in place with 2-0 silk.  In similar fashion the right atrial lead was advanced to the level of the atrial appendage. The active-fixation helix was deployed. There was prominent current of injury. Satisfactory  pacing and sensing parameters were recorded. There was no evidence of diaphragmatic stimulation with pacing at maximum device output. The safe sheath was peeled away and the lead was secured in place with 2-0 silk.  The antibiotic-soaked sponge was removed from the pocket. The pocket was flushed with copious amounts of antibiotic solution. Reinspection showed excellent hemostasis..  The ventricular lead was connected to the generator and appropriate ventricular pacing was seen. Subsequently the atrial lead was also connected. Repeat testing of the lead parameters later showed excellent values.  The entire system was then carefully inserted in the pocket with care been taking that the leads and device assumed a comfortable position without pressure on the incision. Great care was taken that the leads be located deep to the generator. The pocket was then closed in layers using 2 layers of 2-0 Vicryl and cutaneous staples, after which a sterile dressing was applied.  At the end of the procedure the following lead parameters were encountered:  Right ventricular lead sensed R waves 4.4 mV bipolar/9.0 mV unipolar, impedance 750 ohms, threshold 0.5 V at 0.4 ms pulse width.  5 mL of local anesthetic was then  administered to the loop recorder site and  a 1 cm incision was made, after which the device was easily explanted with a hemostat. Hemostasis was achieved with electrocautery and local pressure. Two staples were applied and the site was covered with a sterile dressing.

## 2017-07-16 NOTE — Interval H&P Note (Signed)
History and Physical Interval Note:  07/16/2017 3:37 PM  Stephanie Atkinson  has presented today for surgery, with the diagnosis of symptomatic bradycardia  The various methods of treatment have been discussed with the patient and family. After consideration of risks, benefits and other options for treatment, the patient has consented to  Procedure(s): PACEMAKER IMPLANT (N/A) LOOP RECORDER REMOVAL (N/A) as a surgical intervention .  The patient's history has been reviewed, patient examined, no change in status, stable for surgery.  I have reviewed the patient's chart and labs.  Questions were answered to the patient's satisfaction.     [name]

## 2017-07-17 ENCOUNTER — Encounter (HOSPITAL_COMMUNITY): Payer: Self-pay | Admitting: Cardiovascular Disease

## 2017-07-17 ENCOUNTER — Ambulatory Visit (HOSPITAL_COMMUNITY): Payer: Medicare Other

## 2017-07-17 DIAGNOSIS — I25810 Atherosclerosis of coronary artery bypass graft(s) without angina pectoris: Secondary | ICD-10-CM | POA: Diagnosis not present

## 2017-07-17 DIAGNOSIS — R001 Bradycardia, unspecified: Secondary | ICD-10-CM

## 2017-07-17 DIAGNOSIS — I129 Hypertensive chronic kidney disease with stage 1 through stage 4 chronic kidney disease, or unspecified chronic kidney disease: Secondary | ICD-10-CM | POA: Diagnosis not present

## 2017-07-17 DIAGNOSIS — I252 Old myocardial infarction: Secondary | ICD-10-CM | POA: Diagnosis not present

## 2017-07-17 DIAGNOSIS — I482 Chronic atrial fibrillation: Secondary | ICD-10-CM | POA: Diagnosis not present

## 2017-07-17 MED ORDER — TRAMADOL HCL 50 MG PO TABS
50.0000 mg | ORAL_TABLET | Freq: Two times a day (BID) | ORAL | Status: DC | PRN
  Administered 2017-07-17: 50 mg via ORAL
  Filled 2017-07-17: qty 1

## 2017-07-17 MED FILL — Gentamicin Sulfate Inj 40 MG/ML: INTRAMUSCULAR | Qty: 80 | Status: AC

## 2017-07-17 MED FILL — Vancomycin HCl-Dextrose IV Soln 1 GM/200ML-5%: INTRAVENOUS | Qty: 200 | Status: AC

## 2017-07-17 NOTE — Discharge Instructions (Signed)
Supplemental Discharge Instructions for  Pacemaker/Defibrillator Patients  ACTIVITY Do not raise your left/right arm above shoulder level or extend it backward beyond shoulder level for 2 weeks. Wear the arm sling as a reminder or as needed for comfort for 2 weeks. No heavy lifting or vigorous activity with your left/right arm for 6-8 weeks.             __  NO DRIVING is preferable for 2 weeks; If absolutely necessary, drive only short, familiar routes. DO wear your seatbelt, even if it crosses over the pacemaker site.  WOUND CARE - Keep the wound area clean and dry.  Do not get this area wet for one week. No showers for one week; you may shower on   07/24/17    . - The tape/steri-strips on your wound will fall off; do not pull them off.  No bandage is needed on the site.  DO  NOT apply any creams, oils, or ointments to the wound area. - If you notice any drainage or discharge from the wound, any swelling or bruising at the site, or you develop a fever > 101? F after you are discharged home, call the office at once.  SPECIAL INSTRUCTIONS - You are still able to use cellular telephones; use the ear opposite the side where you have your pacemaker/defibrillator.  Avoid carrying your cellular phone near your device. - When traveling through airports, show security personnel your identification card to avoid being screened in the metal detectors.  Ask the security personnel to use the hand wand. - Avoid arc welding equipment, MRI testing (magnetic resonance imaging), TENS units (transcutaneous nerve stimulators).  Call the office for questions about other devices. - Avoid electrical appliances that are in poor condition or are not properly grounded. - Microwave ovens are safe to be near or to operate.  ADDITIONAL INFORMATION FOR DEFIBRILLATOR PATIENTS SHOULD YOUR DEVICE GO OFF: - If your device goes off ONCE and you feel fine afterward, notify the device clinic nurses. - If your device goes  off ONCE and you do not feel well afterward, call 911. - If your device goes off TWICE, call 911. - If your device goes off TWICE OR MORE IN ONE DAY, call 911.  DO NOT DRIVE YOURSELF OR A FAMILY MEMBER WITH A DEFIBRILLATOR TO THE HOSPITAL--CALL 911.    Heart-Healthy Eating Plan Heart-healthy meal planning includes:  Limiting unhealthy fats.  Increasing healthy fats.  Making other small dietary changes.  You may need to talk with your doctor or a diet specialist (dietitian) to create an eating plan that is right for you. What types of fat should I choose?  Choose healthy fats. These include olive oil and canola oil, flaxseeds, walnuts, almonds, and seeds.  Eat more omega-3 fats. These include salmon, mackerel, sardines, tuna, flaxseed oil, and ground flaxseeds. Try to eat fish at least twice each week.  Limit saturated fats. ? Saturated fats are often found in animal products, such as meats, butter, and cream. ? Plant sources of saturated fats include palm oil, palm kernel oil, and coconut oil.  Avoid foods with partially hydrogenated oils in them. These include stick margarine, some tub margarines, cookies, crackers, and other baked goods. These contain trans fats. What general guidelines do I need to follow?  Check food labels carefully. Identify foods with trans fats or high amounts of saturated fat.  Fill one half of your plate with vegetables and green salads. Eat 4-5 servings of vegetables per  day. A serving of vegetables is: ? 1 cup of raw leafy vegetables. ?  cup of raw or cooked cut-up vegetables. ?  cup of vegetable juice.  Fill one fourth of your plate with whole grains. Look for the word "whole" as the first word in the ingredient list.  Fill one fourth of your plate with lean protein foods.  Eat 4-5 servings of fruit per day. A serving of fruit is: ? One medium whole fruit. ?  cup of dried fruit. ?  cup of fresh, frozen, or canned fruit. ?  cup of 100%  fruit juice.  Eat more foods that contain soluble fiber. These include apples, broccoli, carrots, beans, peas, and barley. Try to get 20-30 g of fiber per day.  Eat more home-cooked food. Eat less restaurant, buffet, and fast food.  Limit or avoid alcohol.  Limit foods high in starch and sugar.  Avoid fried foods.  Avoid frying your food. Try baking, boiling, grilling, or broiling it instead. You can also reduce fat by: ? Removing the skin from poultry. ? Removing all visible fats from meats. ? Skimming the fat off of stews, soups, and gravies before serving them. ? Steaming vegetables in water or broth.  Lose weight if you are overweight.  Eat 4-5 servings of nuts, legumes, and seeds per week: ? One serving of dried beans or legumes equals  cup after being cooked. ? One serving of nuts equals 1 ounces. ? One serving of seeds equals  ounce or one tablespoon.  You may need to keep track of how much salt or sodium you eat. This is especially true if you have high blood pressure. Talk with your doctor or dietitian to get more information. What foods can I eat? Grains Breads, including [place], white, pita, wheat, raisin, rye, oatmeal, and [place]. Tortillas that are neither fried nor made with lard or trans fat. Low-fat rolls, including hotdog and hamburger buns and English muffins. Biscuits. Muffins. Waffles. Pancakes. Light popcorn. Whole-grain cereals. Flatbread. Melba toast. Pretzels. Breadsticks. Rusks. Low-fat snacks. Low-fat crackers, including oyster, saltine, matzo, graham, animal, and rye. Rice and pasta, including brown rice and pastas that are made with whole wheat. Vegetables All vegetables. Fruits All fruits, but limit coconut. Meats and Other Protein Sources Lean, well-trimmed beef, veal, pork, and lamb. Chicken and [place] without skin. All fish and shellfish. Wild duck, rabbit, pheasant, and venison. Egg whites or low-cholesterol egg substitutes. Dried beans, peas,  lentils, and tofu. Seeds and most nuts. Dairy Low-fat or nonfat cheeses, including ricotta, string, and mozzarella. Skim or 1% milk that is liquid, powdered, or evaporated. Buttermilk that is made with low-fat milk. Nonfat or low-fat yogurt. Beverages Mineral water. Diet carbonated beverages. Sweets and Desserts Sherbets and fruit ices. Honey, jam, marmalade, jelly, and syrups. Meringues and gelatins. Pure sugar candy, such as hard candy, jelly beans, gumdrops, mints, marshmallows, and small amounts of dark chocolate. [employer]. Eat all sweets and desserts in moderation. Fats and Oils Nonhydrogenated (trans-free) margarines. Vegetable oils, including soybean, sesame, sunflower, olive, peanut, safflower, corn, canola, and cottonseed. Salad dressings or mayonnaise made with a vegetable oil. Limit added fats and oils that you use for cooking, baking, salads, and as spreads. Other Cocoa powder. Coffee and tea. All seasonings and condiments. The items listed above may not be a complete list of recommended foods or beverages. Contact your dietitian for more options. What foods are not recommended? Grains Breads that are made with saturated or trans fats, oils, or  whole milk. Croissants. Butter rolls. Cheese breads. Sweet rolls. Donuts. Buttered popcorn. Chow mein noodles. High-fat crackers, such as cheese or butter crackers. Meats and Other Protein Sources Fatty meats, such as hotdogs, short ribs, sausage, spareribs, bacon, rib eye roast or steak, and mutton. High-fat deli meats, such as salami and bologna. Caviar. Domestic duck and goose. Organ meats, such as kidney, liver, sweetbreads, and heart. Dairy Cream, sour cream, cream cheese, and creamed cottage cheese. Whole-milk cheeses, including blue (bleu), [address], [place], [place], [address], [place], [address], cheddar, [employer], and [place]. Whole or 2% milk that is liquid, evaporated, or condensed. Whole buttermilk. Cream sauce or high-fat  cheese sauce. Yogurt that is made from whole milk. Beverages Regular sodas and juice drinks with added sugar. Sweets and Desserts Frosting. Pudding. Cookies. Cakes other than angel food cake. Candy that has milk chocolate or white chocolate, hydrogenated fat, butter, coconut, or unknown ingredients. Buttered syrups. Full-fat ice cream or ice cream drinks. Fats and Oils Gravy that has suet, meat fat, or shortening. Cocoa butter, hydrogenated oils, palm oil, coconut oil, palm kernel oil. These can often be found in baked products, candy, fried foods, nondairy creamers, and whipped toppings. Solid fats and shortenings, including bacon fat, salt pork, lard, and butter. Nondairy cream substitutes, such as coffee creamers and sour cream substitutes. Salad dressings that are made of unknown oils, cheese, or sour cream. The items listed above may not be a complete list of foods and beverages to avoid. Contact your dietitian for more information. This information is not intended to replace advice given to you by your health care provider. Make sure you discuss any questions you have with your health care provider. Document Released: 08/13/2011 Document Revised: 07/20/2015 Document Reviewed: 08/05/2013 Elsevier Interactive Patient Education  [employer].

## 2017-07-17 NOTE — Progress Notes (Signed)
   Progress Note  Patient Name: Stephanie Atkinson Date of Encounter: 07/17/2017  Primary Cardiologist: Nanetta Batty, MD   Subjective   No problems overnight,  Inpatient Medications    Scheduled Meds:  Continuous Infusions:  PRN Meds: acetaminophen, ondansetron (ZOFRAN) IV, traMADol   Vital Signs    Vitals:   07/16/17 1741 07/16/17 1942 07/16/17 2330 07/17/17 0419  BP: (!) 171/58 (!) 153/58  139/65  Pulse: 60 (!) 59 60 60  Resp: (!) 21  Temp: 97.6 F (36.4 C) 97.8 F (36.6 C)  98.3 F (36.8 C)  TempSrc: Axillary Oral  Oral  SpO2: 99% 97% 95% 97%  Weight:    157 lb 6.5 oz (71.4 kg)  Height:        Intake/Output Summary (Last 24 hours) at 07/17/2017 0743 Last data filed at 07/17/2017 0600 Gross per 24 hour  Intake 360 ml  Output 850 ml  Net -490 ml   Filed Weights   07/16/17 1342 07/17/17 0419  Weight: 154 lb (69.9 kg) 157 lb 6.5 oz (71.4 kg)    Telemetry    atrial fibrillation, 100% V paced - Personally Reviewed  ECG    V paced - Personally Reviewed  Physical Exam  Minimal oozing at PM site, no hematoma, no problems at loop removal site GEN: No acute distress.   Neck: No JVD Cardiac: RRR, no murmurs, rubs, or gallops.  Respiratory: Clear to auscultation bilaterally. GI: Soft, nontender, non-distended  MS: No edema; No deformity. Neuro:  Nonfocal  Psych: Normal affect   Labs    Chemistry Recent Labs  Lab 07/14/17 1100  NA 141  K 4.7  CL 105  CO2 22  GLUCOSE 106*  BUN 16  CREATININE 1.16*  CALCIUM 10.6*  GFRNONAA 41*  GFRAA 48*     Hematology Recent Labs  Lab 07/14/17 1100  WBC 4.7  RBC 4.55  HGB 13.5  HCT 39.4  MCV 87  MCH 29.7  MCHC 34.3  RDW 15.0  PLT 249    Cardiac EnzymesNo results for input(s): TROPONINI in the last 168 hours. No results for input(s): TROPIPOC in the last 168 hours.   BNPNo results for input(s): BNP, PROBNP in the last 168 hours.   DDimer No results for input(s): DDIMER in the last 168  hours.   Radiology    No results found. CXR pending  Cardiac Studies   R waves 3.6 mV bipolar, 11 mV unipolar, impedance 532 ohm, threshold 0.5V@0 .4 ms  Patient Profile     84 y.o. female s/p single chamber pacemaker for symptomatic bradycardia due to atrial fibrillation with slow response.  Assessment & Plan    DC home if CXR OK. Resume Eliquis tonight. Discussed activity restrictions and wound care. Wound check 10-14 days. Office visit 3 months.  For questions or updates, please contact [facility] Please consult [email or web address] for contact info under Cardiology/STEMI.      Signed, Thurmon Fair, MD  07/17/2017, 7:43 AM

## 2017-07-17 NOTE — Discharge Summary (Signed)
Discharge Summary    Patient ID: Stephanie Atkinson,  MRN: [id number], DOB/AGE: Jul 23, 1924 84 y.o.  Admit date: 07/16/2017 Discharge date: 07/17/2017  Primary Care Provider: Pearson Grippe Primary Cardiologist: Nanetta Batty, MD  Discharge Diagnoses    Principal Problem:   Symptomatic bradycardia Active Problems:   Permanent atrial fibrillation [facility])   Pacemaker   Allergies Allergies  Allergen Reactions  . Penicillins Hives  . Sulfa Antibiotics Swelling    Diagnostic Studies/Procedures    PPM implantation 07/16/17: 1. Implantation of new single chamber permanent pacemaker 2. Fluoroscopy 3. Light sedation 4. Loop recorder explantation _____________   History of Present Illness     Stephanie Atkinson is a 84 y.o. female with CAD s/p CABG, PAFib. She received an implantable loop recorder (for syncope that has not recurred since implant), but the device has reached end of service.  She has been in what appears to be permanent atrial fibrillation with slow ventricular response.  When she saw Dr. Allyson Sabal last month her ventricular rate was only 47 bpm.  She has been complaining of worsening fatigue.  Has had a couple of near syncopal events.  The patient specifically denies any chest pain at rest exertion, dyspnea at rest or with exertion, orthopnea, paroxysmal nocturnal dyspnea, syncope, palpitations, focal neurological deficits, intermittent claudication, lower extremity edema, unexplained weight gain, cough, hemoptysis or wheezing.  No serious bleeding problems with Eliquis.    Hospital Course     Consultants: None   1. Symptomatic bradycardia: patient presented with complaints of fatigue and near syncope. Planned for PPM placement which occurred 5/23 without complications. CXR post procedure with PTX.  - Home medications were continued at discharge. Instructed to resume her eliquis with her PM dose tonight - Scheduled for wound check 07/28/17.  - Will continue to follow up with Dr.  Allyson Sabal   _____________  Discharge Vitals Blood pressure 139/65, pulse 60, temperature 98.3 F (36.8 C), temperature source Oral, resp. rate (!) 21, height  (1.6 m), weight 157 lb 6.5 oz (71.4 kg), SpO2 97 %.  Filed Weights   07/16/17 1342 07/17/17 0419  Weight: 154 lb (69.9 kg) 157 lb 6.5 oz (71.4 kg)    Labs & Radiologic Studies    CBC Recent Labs    07/14/17 1100  WBC 4.7  HGB 13.5  HCT 39.4  MCV 87  PLT 249   Basic Metabolic Panel Recent Labs    40/98/11 1100  NA 141  K 4.7  CL 105  CO2 22  GLUCOSE 106*  BUN 16  CREATININE 1.16*  CALCIUM 10.6*   Liver Function Tests No results for input(s): AST, ALT, ALKPHOS, BILITOT, PROT, ALBUMIN in the last 72 hours. No results for input(s): LIPASE, AMYLASE in the last 72 hours. Cardiac Enzymes No results for input(s): CKTOTAL, CKMB, CKMBINDEX, TROPONINI in the last 72 hours. BNP Invalid input(s): POCBNP D-Dimer No results for input(s): DDIMER in the last 72 hours. Hemoglobin A1C No results for input(s): HGBA1C in the last 72 hours. Fasting Lipid Panel No results for input(s): CHOL, HDL, LDLCALC, TRIG, CHOLHDL, LDLDIRECT in the last 72 hours. Thyroid Function Tests No results for input(s): TSH, T4TOTAL, T3FREE, THYROIDAB in the last 72 hours.  Invalid input(s): FREET3 _____________  Dg Chest 2 View  Result Date: 07/10/2017 CLINICAL DATA:  Headache neck and back pain EXAM: CHEST - 2 VIEW COMPARISON:  11/30/2016 FINDINGS: Post sternotomy changes. Recording device over the left lower chest. Marked cardiomegaly. Mild central vascular congestion.  No pleural effusion or focal consolidation. Aortic atherosclerosis. No pneumothorax. IMPRESSION: Marked cardiomegaly with mild central vascular congestion. Electronically Signed   By: Jasmine Pang M.D.   On: 07/10/2017 03:22   Ct Head Wo Contrast  Result Date: 07/10/2017 CLINICAL DATA:  84 y/o F; three days of neck pain. History of hypertension, syncope, thyroidectomy, and  carotid stent. EXAM: CT HEAD WITHOUT CONTRAST CT CERVICAL SPINE WITHOUT CONTRAST TECHNIQUE: Multidetector CT imaging of the head and cervical spine was performed following the standard protocol without intravenous contrast. Multiplanar CT image reconstructions of the cervical spine were also generated. COMPARISON:  06/30/2017 CT angiogram of the neck. 12/26/2016 CT of the head and cervical spine. FINDINGS: CT HEAD FINDINGS Brain: No evidence of acute infarction, hemorrhage, hydrocephalus, extra-axial collection or mass lesion/mass effect. Stable small chronic lacunar infarcts within left putamen and left thalamus. Stable small chronic infarct in the right superior cerebellum. Stable chronic microvascular ischemic changes and parenchymal volume loss of the brain. Vascular: Calcific atherosclerosis of carotid siphons and vertebral arteries. Skull: Normal. Negative for fracture or focal lesion. Sinuses/Orbits: No acute finding. Other: None. CT CERVICAL SPINE FINDINGS Alignment: Stable C4-5 and C5-6 grade 1 anterolisthesis. Skull base and vertebrae: No acute fracture. No primary bone lesion or focal pathologic process. Soft tissues and spinal canal: No prevertebral fluid or swelling. No visible canal hematoma. Disc levels: Stable severe productive changes of the anterior C1-2 articulation and prominent right-sided facet arthropathy. No high-grade bony canal stenosis. Upper chest: Negative. Other: Stable prominent left posterior lower cervical lymph node measuring up to 11 mm. IMPRESSION: 1. No acute intracranial abnormality or calvarial fracture. 2. No acute fracture or dislocation of cervical spine. 3. Stable chronic microvascular ischemic changes and parenchymal volume loss of the brain. 4. Stable spondylosis of the cervical spine with grade 1 C4-5 and C5-6 anterolisthesis. Electronically Signed   By: Mitzi Hansen M.D.   On: 07/10/2017 06:40   Ct Angio Neck W And/or Wo Contrast  Result Date:  07/10/2017 CLINICAL DATA:  84 y/o F; 3 days of neck pain. History of hypertension, syncope, thyroidectomy, and carotid stent. EXAM: CT ANGIOGRAPHY NECK TECHNIQUE: Multidetector CT imaging of the neck was performed using the standard protocol during bolus administration of intravenous contrast. Multiplanar CT image reconstructions and MIPs were obtained to evaluate the vascular anatomy. Carotid stenosis measurements (when applicable) are obtained utilizing NASCET criteria, using the distal internal carotid diameter as the denominator. CONTRAST:  50mL ISOVUE-370 IOPAMIDOL (ISOVUE-370) INJECTION 76% COMPARISON:  07/10/2017 CT head and cervical spine. 12/26/2016 CT head and cervical spine. FINDINGS: Aortic arch: Standard branching. Imaged portion shows no evidencece of aneurysm or dissection. No significant stenosis of the major arch vessel origins. Mild calcific atherosclerosis. Right carotid system: No evidence of dissection, stenosis (50% or greater) or occlusion. Calcific atherosclerosis of the carotid siphons with mild less than 50% proximal ICA stenosis. Left carotid system: No evidence of dissection, stenosis (50% or greater) or occlusion. Vertebral arteries: Left dominant, diminutive right vertebral artery. No evidence of dissection, stenosis (50% or greater) or occlusion. Skeleton: C4-5 and C5-6 grade 1 anterolisthesis. Severe productive changes of the anterior C1-2 articulation and prominent right-sided facet arthropathy. No high-grade bony canal stenosis. Other neck: Calcified mediastinal lymph nodes compatible with prior granulomatous disease. Upper chest: Negative. IMPRESSION: 1. Patent carotid and vertebral arteries. No dissection, aneurysm, or hemodynamically significant stenosis utilizing NASCET criteria. 2. Mild calcific atherosclerosis of the aorta and bilateral carotid bifurcations. 3. Mild less than 50% proximal right ICA stenosis. 4.  Calcified mediastinal lymph nodes compatible with prior  granulomatous disease. Electronically Signed   By: Mitzi Hansen M.D.   On: 07/10/2017 06:41   Ct Cervical Spine Wo Contrast  Result Date: 07/10/2017 CLINICAL DATA:  84 y/o F; three days of neck pain. History of hypertension, syncope, thyroidectomy, and carotid stent. EXAM: CT HEAD WITHOUT CONTRAST CT CERVICAL SPINE WITHOUT CONTRAST TECHNIQUE: Multidetector CT imaging of the head and cervical spine was performed following the standard protocol without intravenous contrast. Multiplanar CT image reconstructions of the cervical spine were also generated. COMPARISON:  06/30/2017 CT angiogram of the neck. 12/26/2016 CT of the head and cervical spine. FINDINGS: CT HEAD FINDINGS Brain: No evidence of acute infarction, hemorrhage, hydrocephalus, extra-axial collection or mass lesion/mass effect. Stable small chronic lacunar infarcts within left putamen and left thalamus. Stable small chronic infarct in the right superior cerebellum. Stable chronic microvascular ischemic changes and parenchymal volume loss of the brain. Vascular: Calcific atherosclerosis of carotid siphons and vertebral arteries. Skull: Normal. Negative for fracture or focal lesion. Sinuses/Orbits: No acute finding. Other: None. CT CERVICAL SPINE FINDINGS Alignment: Stable C4-5 and C5-6 grade 1 anterolisthesis. Skull base and vertebrae: No acute fracture. No primary bone lesion or focal pathologic process. Soft tissues and spinal canal: No prevertebral fluid or swelling. No visible canal hematoma. Disc levels: Stable severe productive changes of the anterior C1-2 articulation and prominent right-sided facet arthropathy. No high-grade bony canal stenosis. Upper chest: Negative. Other: Stable prominent left posterior lower cervical lymph node measuring up to 11 mm. IMPRESSION: 1. No acute intracranial abnormality or calvarial fracture. 2. No acute fracture or dislocation of cervical spine. 3. Stable chronic microvascular ischemic changes and  parenchymal volume loss of the brain. 4. Stable spondylosis of the cervical spine with grade 1 C4-5 and C5-6 anterolisthesis. Electronically Signed   By: Mitzi Hansen M.D.   On: 07/10/2017 06:40   Disposition   Patient was seen and examined by Dr. Royann Shivers who deemed patient as stable for discharge. Follow-up has been arranged. Discharge medications as listed below.   Follow-up Plans & Appointments    Follow-up Information    Croitoru, Mihai, MD Follow up on 10/17/2017.   Specialty:  Cardiology Why:  Please arrive 15 minutes early for your 10:40am appointment Contact information: [address] [place] [address] [phone]        [facility] Follow up on 07/28/2017.   Specialty:  Cardiology Why:  Please arrive 15 minutes early for your 11:30am wound check appointment Contact information: [address] [place] [address] [phone]         Discharge Instructions    Diet - low sodium heart healthy   Complete by:  As directed    Increase activity slowly   Complete by:  As directed       Discharge Medications   Allergies as of 07/17/2017      Reactions   Penicillins Hives   Sulfa Antibiotics Swelling      Medication List    STOP taking these medications   valsartan 320 MG tablet Commonly known as:  DIOVAN     TAKE these medications   acetaminophen 325 MG tablet Commonly known as:  TYLENOL Take 650 mg by mouth every 4 (four) hours as needed. For pain   amLODipine 5 MG tablet Commonly known as:  NORVASC Take 5 mg by mouth daily.   clobetasol cream 0.05 % Commonly known as:  TEMOVATE Apply 1  application topically as needed (to legs).   diclofenac sodium 1 % Gel Commonly known as:  VOLTAREN Apply 4 g topically 4 (four) times daily.   ELIQUIS 5 MG Tabs tablet Generic drug:  apixaban TAKE 1 TABLET BY MOUTH TWICE A DAY Notes to patient:  You can restart this medication  tonight 07/17/17.    furosemide 40 MG tablet Commonly known as:  LASIX Take 1 tablet (40 mg total) by mouth daily.   irbesartan 300 MG tablet Commonly known as:  AVAPRO Take 300 mg by mouth daily.   levothyroxine 50 MCG tablet Commonly known as:  SYNTHROID, LEVOTHROID Take 50 mcg by mouth daily.   LIPITOR 80 MG tablet Generic drug:  atorvastatin Take 80 mg by mouth daily.   meclizine 25 MG tablet Commonly known as:  ANTIVERT Take 1 tablet (25 mg total) by mouth 3 (three) times daily as needed for dizziness.   methocarbamol 500 MG tablet Commonly known as:  ROBAXIN Take 1 tablet (500 mg total) by mouth 2 (two) times daily.   nitroGLYCERIN 0.4 MG SL tablet Commonly known as:  NITROSTAT Place 0.4 mg under the tongue every 5 (five) minutes as needed. For chest pain   traMADol 50 MG tablet Commonly known as:  ULTRAM Take 50 mg by mouth 2 (two) times daily.         Outstanding Labs/Studies   None  Duration of Discharge Encounter   Greater than 30 minutes including physician time.  Signed, Beatriz Stallion PA-C 07/17/2017, 9:25 AM

## 2017-07-17 NOTE — Progress Notes (Signed)
Discharge order received.  Spoke with Dr Rubie Maid, he stated to have patient remove dressing from left chest and cover with gauze tomorrow.  Tape and gauze provided to patient.  Discharge instructions reviewed with and given to patient. IV and telemetry removed by NT Yasmin.

## 2017-07-28 ENCOUNTER — Ambulatory Visit (INDEPENDENT_AMBULATORY_CARE_PROVIDER_SITE_OTHER): Payer: Medicare Other | Admitting: *Deleted

## 2017-07-28 DIAGNOSIS — Z950 Presence of cardiac pacemaker: Secondary | ICD-10-CM | POA: Diagnosis not present

## 2017-07-28 DIAGNOSIS — I4891 Unspecified atrial fibrillation: Secondary | ICD-10-CM | POA: Diagnosis not present

## 2017-07-28 DIAGNOSIS — R001 Bradycardia, unspecified: Secondary | ICD-10-CM

## 2017-07-28 LAB — CUP PACEART INCLINIC DEVICE CHECK
Battery Voltage: 3.18 V
Brady Statistic RV Percent Paced: 79.44 %
Implantable Lead Model: 5076
Lead Channel Impedance Value: 361 Ohm
Lead Channel Impedance Value: 437 Ohm
Lead Channel Setting Pacing Pulse Width: 0.4 ms
Lead Channel Setting Sensing Sensitivity: 1.2 mV
MDC IDC LEAD IMPLANT DT: 20190522
MDC IDC LEAD LOCATION: 753860
MDC IDC MSMT BATTERY REMAINING LONGEVITY: 154 mo
MDC IDC MSMT LEADCHNL RV PACING THRESHOLD AMPLITUDE: 0.5 V
MDC IDC MSMT LEADCHNL RV PACING THRESHOLD PULSEWIDTH: 0.4 ms
MDC IDC MSMT LEADCHNL RV SENSING INTR AMPL: 5 mV
MDC IDC PG IMPLANT DT: 20190522
MDC IDC SESS DTM: 20190603122918
MDC IDC SET LEADCHNL RV PACING AMPLITUDE: 3.5 V

## 2017-07-28 NOTE — Progress Notes (Signed)
Wound check appointment. Staples removed from PPM implant site and from ILR explant site. Wounds without redness or edema. Incision edges approximated, wounds well healed. Normal device function. Thresholds, sensing, and impedances consistent with implant measurements. Device programmed at 3.5V with auto capture programmed on for extra safety margin until 3 month visit. RV lead monitor reprogrammed to adaptive per clinic protocol, RV capture high threshold alert turned on. Histogram distribution appropriate for patient and level of activity. Permanent AF, +Eliquis. No high ventricular rates noted. Patient educated about wound care, arm mobility, lifting restrictions, and Carelink monitor. ROV with [facility] on 10/17/17.

## 2017-08-04 ENCOUNTER — Other Ambulatory Visit: Payer: Self-pay | Admitting: Cardiovascular Disease

## 2017-10-17 ENCOUNTER — Ambulatory Visit: Payer: Medicare Other | Admitting: Cardiovascular Disease

## 2017-10-17 ENCOUNTER — Encounter: Payer: Self-pay | Admitting: Cardiovascular Disease

## 2017-10-17 VITALS — BP 156/83 | HR 86 | Ht 63.0 in | Wt 156.0 lb

## 2017-10-17 DIAGNOSIS — Z950 Presence of cardiac pacemaker: Secondary | ICD-10-CM | POA: Diagnosis not present

## 2017-10-17 DIAGNOSIS — I4891 Unspecified atrial fibrillation: Secondary | ICD-10-CM | POA: Diagnosis not present

## 2017-10-17 DIAGNOSIS — Z7901 Long term (current) use of anticoagulants: Secondary | ICD-10-CM

## 2017-10-17 DIAGNOSIS — E7800 Pure hypercholesterolemia, unspecified: Secondary | ICD-10-CM

## 2017-10-17 DIAGNOSIS — I10 Essential (primary) hypertension: Secondary | ICD-10-CM

## 2017-10-17 DIAGNOSIS — I2510 Atherosclerotic heart disease of native coronary artery without angina pectoris: Secondary | ICD-10-CM

## 2017-10-17 DIAGNOSIS — I4430 Unspecified atrioventricular block: Secondary | ICD-10-CM | POA: Diagnosis not present

## 2017-10-17 MED ORDER — AMLODIPINE BESYLATE 5 MG PO TABS: 7.5000 mg | ORAL_TABLET | Freq: Every day | ORAL | 3 refills | Status: DC

## 2017-10-17 NOTE — Patient Instructions (Signed)
Dr Royann Shiversroitoru has recommended making the following medication changes: 1. INCREASE Amlodipine to 7.5 mg daily  Your physician has requested that you regularly monitor your blood pressure at home. Please use the same machine to check your blood pressure daily. Keep a record of your blood pressures using the log sheet provided. In 3 weeks, please report your readings back to Dr C. You may use our online patient portal 'MyChart' or you can call the office to speak with a nurse.  Remote monitoring is used to monitor your Pacemaker or ICD from home. This monitoring reduces the number of office visits required to check your device to one time per year. It allows us to keep an eye on the functioning of your device to ensure it is working properly. You are scheduled for a device check from home on Friday, November 22nd, 2019. You may send your transmission at any time that day. If you have a wireless device, the transmission will be sent automatically. After your physician reviews your transmission, you will receive a postcard with your next transmission date.  To improve our patient care and to more adequately follow your device, [facility] has decided, as a practice, to start following each patient four times a year with your home monitor. This means that you may experience a remote appointment that is close to an in-office appointment with your physician. Your insurance will apply at the same rate as other remote monitoring transmissions.  Dr Royann Shiversroitoru recommends that you schedule a follow-up appointment in 6 months with a pacemaker check. You will receive a reminder letter in the mail two months in advance. If you don't receive a letter, please call our office to schedule the follow-up appointment.  If you need a refill on your cardiac medications before your next appointment, please call your pharmacy.

## 2017-10-17 NOTE — Progress Notes (Signed)
Patient ID: Stephanie Atkinson, female   DOB: 03-27-1924, 84 y.o.   MRN: [id number]    Cardiology Office Note    Date:  10/17/2017   ID:  ASENCION GUISINGER, DOB 1924/12/20, MRN [id number]  PCP:  Pearson Grippe, MD  Cardiologist:  Nanetta Batty, MD  Thurmon Fair, MD   Chief Complaint  Patient presents with  . Follow-up    Pacemaker implantation followup    History of Present Illness:  Stephanie Atkinson is a 84 y.o. female with CAD s/p CABG, permanent AFib with slow ventricular response, remote history of syncope who received a single-chamber permanent pacemaker (Medtronic Azure XT SR MRI, Jul 16, 2017), returning for her first office follow-up.  The patient specifically denies any chest pain at rest exertion, dyspnea at rest or with exertion, orthopnea, paroxysmal nocturnal dyspnea, syncope, palpitations, focal neurological deficits, intermittent claudication, lower extremity edema, unexplained weight gain, cough, hemoptysis or wheezing.  Occasionally she is bothered by the pacemaker site when she turns over but she is getting used to it.  She has not had any bleeding problems on Eliquis.  Pacemaker interrogation shows normal device function.  We manually checked the pacing threshold today and was excellent at 0.5 V at 0.4 ms.  Device output reduced to chronic settings.  Sensing is fair with R waves of 6.5 mV and the lead impedance is 437 ohms.  Battery longevity 10.6 years but is likely to increase after the change in device outputs.  She has had 95% ventricular pacing with adequate ventricular heart rate histograms.  Activity level is fairly constant at roughly 2.7 hours/day.  There have been no episodes of high ventricular rate.  Presenting rhythm was 100% ventricular paced.  She reports that her systolic blood pressure has been consistently high at around 150.  Past Medical History:  Diagnosis Date  . Chronic kidney disease, stage 3 (HCC)   . Coronary artery disease   . Hx of CABG 2006   LIMA-LAD,  free RIMA-PDA, Radial-OM1-OM2  . Hyperlipidemia   . Hypertension   . Hypothyroid   . MI, old       . PAF (paroxysmal atrial fibrillation) (HCC)    was on amio, d/c'd due to prolonged PR interval, rate control  . Presence of permanent cardiac pacemaker 07/16/2017  . Presence of stent in right coronary artery 07/12   RIMA-PDA occluded, DES stent placed  . Syncope 07/12   bradycardic, beta blocker decreased, also felt to be dehydrated    Past Surgical History:  Procedure Laterality Date  . CARDIAC CATHETERIZATION  782956   dr. Onalee Hua harding, revealing a atretic bypass to her right with patent distal RCA stent  . CAROTID STENT  2012  . CORONARY ARTERY BYPASS GRAFT  2006   LIMA-LAD, free RIMA-PDA, Radial-OM1-OM2  . DOPPLER ECHOCARDIOGRAPHY  [id number]   mild asymmetric left ventricular hypertrophy, left ventricular systolic function is low normal, ejection fraction = 50-55%, the LA is moderate dilated, the RA is mildly dilated, no significant valvular disease  . INSERT / REPLACE / REMOVE PACEMAKER  07/16/2017  . JOINT REPLACEMENT     hip replacement  . LEFT HEART CATHETERIZATION WITH CORONARY/GRAFT ANGIOGRAM N/A 10/12/2012   Procedure: LEFT HEART CATHETERIZATION WITH Isabel Caprice;  Surgeon: Marykay Lex, MD;  Location: [facility] CATH LAB;  Service: Cardiovascular;  Laterality: N/A;  . LOOP RECORDER IMPLANT N/A 10/15/2012   Procedure: LOOP RECORDER IMPLANT;  Surgeon: Thurmon Fair, MD;  Location: [facility] CATH LAB;  Service: Cardiovascular;  Laterality: N/A;  . LOOP RECORDER REMOVAL  07/16/2017  . LOOP RECORDER REMOVAL N/A 07/16/2017   Procedure: LOOP RECORDER REMOVAL;  Surgeon: Thurmon Fair, MD;  Location: [facility] INVASIVE CV LAB;  Service: Cardiovascular;  Laterality: N/A;  . NM MYOVIEW LTD  100510   post stress left ventricle is normal in size, post stress ejection fraction is 67% global left ventricular systolic function is normal, normal myocardial perfusion study, abnormal myocardial  perfusion study, low risk scan  . PACEMAKER IMPLANT N/A 07/16/2017   Procedure: PACEMAKER IMPLANT;  Surgeon: Thurmon Fair, MD;  Location: [facility] INVASIVE CV LAB;  Service: Cardiovascular;  Laterality: N/A;  . THYROIDECTOMY      Current Outpatient Medications  Medication Sig Dispense Refill  . acetaminophen (TYLENOL) 325 MG tablet Take 650 mg by mouth every 4 (four) hours as needed. For pain    . amLODipine (NORVASC) 5 MG tablet Take 1.5 tablets (7.5 mg total) by mouth daily. 135 tablet 3  . atorvastatin (LIPITOR) 80 MG tablet Take 80 mg by mouth daily.    . clobetasol cream (TEMOVATE) 0.05 % Apply 1 application topically as needed (to legs).   3  . diclofenac sodium (VOLTAREN) 1 % GEL Apply 4 g topically 4 (four) times daily. 100 g 0  . ELIQUIS 5 MG TABS tablet TAKE 1 TABLET BY MOUTH TWICE A DAY 180 tablet 1  . furosemide (LASIX) 40 MG tablet Take 1 tablet (40 mg total) by mouth daily. 90 tablet 3  . irbesartan (AVAPRO) 300 MG tablet Take 300 mg by mouth daily.  3  . levothyroxine (SYNTHROID, LEVOTHROID) 50 MCG tablet Take 50 mcg by mouth daily.    . meclizine (ANTIVERT) 25 MG tablet Take 1 tablet (25 mg total) by mouth 3 (three) times daily as needed for dizziness. 30 tablet 0  . methocarbamol (ROBAXIN) 500 MG tablet Take 1 tablet (500 mg total) by mouth 2 (two) times daily. 14 tablet 0  . nitroGLYCERIN (NITROSTAT) 0.4 MG SL tablet Place 0.4 mg under the tongue every 5 (five) minutes as needed. For chest pain    . traMADol (ULTRAM) 50 MG tablet Take 50 mg by mouth 2 (two) times daily.      No current facility-administered medications for this visit.     Allergies:   Penicillins and Sulfa antibiotics   Social History   Socioeconomic History  . Marital status: Single    Spouse name: Not on file  . Number of children: Not on file  . Years of education: Not on file  . Highest education level: Not on file  Occupational History  . Not on file  Social Needs  . Financial resource strain:  Not on file  . Food insecurity:    Worry: Not on file    Inability: Not on file  . Transportation needs:    Medical: Not on file    Non-medical: Not on file  Tobacco Use  . Smoking status: Never Smoker  . Smokeless tobacco: Current User    Types: Snuff  Substance and Sexual Activity  . Alcohol use: No  . Drug use: No  . Sexual activity: Not on file  Lifestyle  . Physical activity:    Days per week: Not on file    Minutes per session: Not on file  . Stress: Not on file  Relationships  . Social connections:    Talks on phone: Not on file    Gets together: Not on file    Attends religious service:  Not on file    Active member of club or organization: Not on file    Attends meetings of clubs or organizations: Not on file    Relationship status: Not on file  Other Topics Concern  . Not on file  Social History Narrative  . Not on file     Family History:  The patient's family history includes CAD in her maternal grandmother and mother.   ROS:   Please see the history of present illness.    Review of Systems  All other systems reviewed and are negative.    PHYSICAL EXAM:   VS:  BP (!) 156/83   Pulse 86   Ht 5\' 3"  (1.6 m)   Wt 156 lb (70.8 kg)   BMI 27.63 kg/m      General: Alert, oriented x3, no distress, appears lean, fit, looks younger than stated age Head: no evidence of trauma, PERRL, EOMI, no exophtalmos or lid lag, no myxedema, no xanthelasma; normal ears, nose and oropharynx Neck: normal jugular venous pulsations and no hepatojugular reflux; brisk carotid pulses without delay and no carotid bruits Chest: clear to auscultation, no signs of consolidation by percussion or palpation, normal fremitus, symmetrical and full respiratory excursions, left subclavian pacemaker site has healed very nicely Cardiovascular: normal position and quality of the apical impulse, regular rhythm, normal first and paradoxically split second heart sounds, no murmurs, rubs or  gallops Abdomen: no tenderness or distention, no masses by palpation, no abnormal pulsatility or arterial bruits, normal bowel sounds, no hepatosplenomegaly Extremities: no clubbing, cyanosis or edema; 2+ radial, ulnar and brachial pulses bilaterally; 2+ right femoral, posterior tibial and dorsalis pedis pulses; 2+ left femoral, posterior tibial and dorsalis pedis pulses; no subclavian or femoral bruits Neurological: grossly nonfocal Psych: Normal mood and affect  Wt Readings from Last 3 Encounters:  10/17/17 156 lb (70.8 kg)  07/17/17 157 lb 6.5 oz (71.4 kg)  07/10/17 154 lb (69.9 kg)      Studies/Labs Reviewed:   EKG:  EKG is not ordered today.  The intracardiac electrogram shows 100% ventricular pacing at presentation  Recent Labs: 11/30/2016: ALT 21 07/14/2017: BUN 16; Creatinine, Ser 1.16; Hemoglobin 13.5; Platelets 249; Potassium 4.7; Sodium 141   Lipid Panel    Component Value Date/Time   CHOL 127 11/25/2016 1005   TRIG 117 11/25/2016 1005   HDL 46 11/25/2016 1005   CHOLHDL 2.8 11/25/2016 1005   CHOLHDL 2.6 01/05/2015 1118   VLDL 18 01/05/2015 1118   LDLCALC 58 11/25/2016 1005     ASSESSMENT:    1. Pacemaker   2. Atrial fibrillation with slow ventricular response (HCC)   3. Long term current use of anticoagulant   4. Essential hypertension   5. Coronary artery disease involving native coronary artery of native heart without angina pectoris   6. Hypercholesterolemia      PLAN:  In order of problems listed above: 1.   Pacemaker: Normal device function.  Remote downloads every 3 months and yearly office visits. Atrial fibrillation permanent, with slow ventricular response. Eliquis: Well-tolerated no bleeding complications HBP: Not well controlled.  Increase amlodipine to 7.5 mg daily.  If necessary this can be titrated further in the canal also use beta-blocker since she has a pacemaker. CAD: she does not have angina pectoris. > 6 years since last  revascularization procedure, about 3 years ago cath was OK HLP: Excellent LDL-C on statin.   Medication Adjustments/Labs and Tests Ordered: Current medicines are reviewed at length with the  patient today.  Concerns regarding medicines are outlined above.  Medication changes, Labs and Tests ordered today are listed below. Patient Instructions  Dr Royann Shivers has recommended making the following medication changes: 1. INCREASE Amlodipine to 7.5 mg daily  Your physician has requested that you regularly monitor your blood pressure at home. Please use the same machine to check your blood pressure daily. Keep a record of your blood pressures using the log sheet provided. In 3 weeks, please report your readings back to Dr C. You may use our online patient portal 'MyChart' or you can call the office to speak with a nurse.  Remote monitoring is used to monitor your Pacemaker or ICD from home. This monitoring reduces the number of office visits required to check your device to one time per year. It allows [place] to keep an eye on the functioning of your device to ensure it is working properly. You are scheduled for a device check from home on Friday, November 22nd, 2019. You may send your transmission at any time that day. If you have a wireless device, the transmission will be sent automatically. After your physician reviews your transmission, you will receive a postcard with your next transmission date.  To improve our patient care and to more adequately follow your device, [facility] has decided, as a practice, to start following each patient four times a year with your home monitor. This means that you may experience a remote appointment that is close to an in-office appointment with your physician. Your insurance will apply at the same rate as other remote monitoring transmissions.  Dr Royann Shivers recommends that you schedule a follow-up appointment in 6 months with a pacemaker check. You will receive a  reminder letter in the mail two months in advance. If you don't receive a letter, please call our office to schedule the follow-up appointment.  If you need a refill on your cardiac medications before your next appointment, please call your pharmacy.   Signed, Thurmon Fair, MD  10/17/2017 6:49 PM    [facility] [address] [place], [place], [place]  [address] Phone: [phone]; Fax: [phone]

## 2017-11-14 ENCOUNTER — Ambulatory Visit (INDEPENDENT_AMBULATORY_CARE_PROVIDER_SITE_OTHER): Payer: Medicare Other | Admitting: Orthopaedic Surgery

## 2017-11-18 ENCOUNTER — Telehealth: Payer: Self-pay | Admitting: Cardiovascular Disease

## 2017-11-18 MED ORDER — AMLODIPINE BESYLATE 10 MG PO TABS: 10.0000 mg | ORAL_TABLET | Freq: Every day | ORAL | 1 refills | Status: DC

## 2017-11-18 NOTE — Telephone Encounter (Signed)
Patient is currently taking 1.5 tablets (7.5mg ) would you like to increase further and then have her call back in 3 weeks with 3-4 days of BP readings?   Thank you!

## 2017-11-18 NOTE — Telephone Encounter (Signed)
Medication was sent to pharmacy.

## 2017-11-18 NOTE — Telephone Encounter (Signed)
Oh, sorry. Please go to 10 mg daily. That should do it. MCr

## 2017-11-18 NOTE — Telephone Encounter (Signed)
Called patient, advised her of the increase in medication. Patient is going to continue her current dose of 5mg  but take them two times daily to equal 10mg . A new RX was submitted to her pharmacy when she does run out. Patient verbalized understanding to call back to discuss BP readings in 3 weeks, or to call us if any issues occur before then,

## 2017-11-18 NOTE — Telephone Encounter (Signed)
Pt c/o BP issue:  1. What are your last 5 BP readings? 138/66 hr 75, 138/61 hr 76, 159/83 hr 71,   2. Are you having any other symptoms (ex. Dizziness, headache, blurred vision, passed out)? No  3. What is your medication issue? No  Patient just wanted to give BP. Please call patient.

## 2017-11-18 NOTE — Telephone Encounter (Signed)
Follow up    Patient is returning call. She states that she received a call at around 1:50 pm for a callback in reference to the below message.

## 2017-11-18 NOTE — Telephone Encounter (Signed)
Patient called in with her BP, she is having no symptoms at this time.   11/03/17- BP 138/66 HR 75 11/09/17- BP 148/71 HR 80 11/11/17- BP 138/61 HR 76 11/12/17- BP 163/74 HR 79 11/13/17- BP 136/70 HR 70  Took it this morning 11/18/17- BP 159/83 HR 71  Patient would like to see if any changes to medication needs to be made. Patient verbalizes that it was hard to keep track of her BP but she is trying. Thanks!

## 2017-11-18 NOTE — Telephone Encounter (Signed)
Attempted to contact patient, LMTCB to discuss medication changes.

## 2017-11-18 NOTE — Telephone Encounter (Signed)
Please increase amlodipine to 7.5 mg daily and check back with 3-4 days of BP readings after 3 weeks. MCr

## 2018-01-16 ENCOUNTER — Ambulatory Visit (INDEPENDENT_AMBULATORY_CARE_PROVIDER_SITE_OTHER): Payer: Medicare Other

## 2018-01-16 DIAGNOSIS — R001 Bradycardia, unspecified: Secondary | ICD-10-CM | POA: Diagnosis not present

## 2018-01-16 NOTE — Progress Notes (Signed)
Remote pacemaker transmission.   

## 2018-01-19 ENCOUNTER — Encounter: Payer: Self-pay | Admitting: Cardiology

## 2018-02-03 ENCOUNTER — Other Ambulatory Visit: Payer: Self-pay | Admitting: Cardiovascular Disease

## 2018-03-13 LAB — CUP PACEART REMOTE DEVICE CHECK
Battery Voltage: 3.09 V
Implantable Lead Implant Date: 20190522
Implantable Lead Model: 5076
Implantable Pulse Generator Implant Date: 20190522
Lead Channel Impedance Value: 399 Ohm
Lead Channel Pacing Threshold Amplitude: 0.5 V
Lead Channel Pacing Threshold Pulse Width: 0.4 ms
Lead Channel Sensing Intrinsic Amplitude: 7 mV
Lead Channel Sensing Intrinsic Amplitude: 7 mV
Lead Channel Setting Pacing Pulse Width: 0.4 ms
MDC IDC LEAD LOCATION: 753860
MDC IDC MSMT BATTERY REMAINING LONGEVITY: 144 mo
MDC IDC MSMT LEADCHNL RV IMPEDANCE VALUE: 513 Ohm
MDC IDC SESS DTM: 20191122110332
MDC IDC SET LEADCHNL RV PACING AMPLITUDE: 2.5 V
MDC IDC SET LEADCHNL RV SENSING SENSITIVITY: 1.2 mV
MDC IDC STAT BRADY RV PERCENT PACED: 98.09 %

## 2018-03-17 ENCOUNTER — Ambulatory Visit (INDEPENDENT_AMBULATORY_CARE_PROVIDER_SITE_OTHER): Payer: Medicare Other | Admitting: Orthopaedic Surgery

## 2018-03-17 ENCOUNTER — Encounter (INDEPENDENT_AMBULATORY_CARE_PROVIDER_SITE_OTHER): Payer: Self-pay | Admitting: Orthopaedic Surgery

## 2018-03-17 ENCOUNTER — Ambulatory Visit (INDEPENDENT_AMBULATORY_CARE_PROVIDER_SITE_OTHER): Payer: Self-pay

## 2018-03-17 VITALS — BP 154/85 | HR 85 | Ht 63.0 in | Wt 153.0 lb

## 2018-03-17 DIAGNOSIS — M25561 Pain in right knee: Secondary | ICD-10-CM

## 2018-03-17 DIAGNOSIS — G8929 Other chronic pain: Secondary | ICD-10-CM | POA: Diagnosis not present

## 2018-03-17 DIAGNOSIS — M1711 Unilateral primary osteoarthritis, right knee: Secondary | ICD-10-CM | POA: Diagnosis not present

## 2018-03-17 MED ORDER — BUPIVACAINE HCL 0.25 % IJ SOLN
6.0000 mL | INTRAMUSCULAR | Status: AC | PRN
  Administered 2018-03-17: 6 mL via INTRA_ARTICULAR

## 2018-03-17 MED ORDER — LIDOCAINE HCL 1 % IJ SOLN
3.0000 mL | INTRAMUSCULAR | Status: AC | PRN
  Administered 2018-03-17: 3 mL

## 2018-03-17 MED ORDER — METHYLPREDNISOLONE ACETATE 40 MG/ML IJ SUSP
40.0000 mg | INTRAMUSCULAR | Status: AC | PRN
  Administered 2018-03-17: 40 mg via INTRA_ARTICULAR

## 2018-03-17 NOTE — Progress Notes (Signed)
Office Visit Note   Patient: Stephanie Atkinson           Date of Birth: 07/15/1924           MRN: [id number] Visit Date: 03/17/2018              Requested by: Pearson GrippeKim, Mackenzie Groom, MD [address] [place], [place] [id number] PCP: Pearson GrippeKim, Russell Engelstad, MD   Assessment & Plan: Visit Diagnoses:  1. Chronic pain of right knee   2. Arthritis of right knee     Plan: In hopes of giving patient some relief of her pain and swelling offered injection.  After patient consent right knee was prepped with Betadine and after aspirating about 10 cc of serous fluid working/Depo-Medrol injection was performed.  Patient will follow-up in the office PRN.  Return if continues to have ongoing symptoms.  Follow-Up Instructions: Return if symptoms worsen or fail to improve.   Orders:  Orders Placed This Encounter  Procedures  . XR Knee 1-2 Views Right   No orders of the defined types were placed in this encounter.     Procedures: Large Joint Inj: R knee on 03/17/2018 3:50 PM Indications: pain and joint swelling Details: 22 G 1.5 in needle, superolateral approach Medications: 3 mL lidocaine 1 %; 6 mL bupivacaine 0.25 %; 40 mg methylPREDNISolone acetate 40 MG/ML Aspirate: serous Consent was given by the patient. Patient was prepped and draped in the usual sterile fashion.       Clinical Data: No additional findings.   Subjective: Chief Complaint  Patient presents with  . Right Knee - Pain    HPI 85 year old female comes in on with complaints of right knee pain.  She has known history of arthritis.  Last intra-articular injection performed 2018.  States that she has been having some soreness along with swelling in the knee.  Also some swelling in the right lower leg.  She is currently on Eliquis for atrial fibrillation.  Knee soreness with increased activity. Review of Systems   Objective: Vital Signs: BP (!) 154/85   Pulse 85   Ht 5\' 3"  (1.6 m)   Wt 153 lb (69.4 kg)   BMI 27.10 kg/m    Physical Exam Constitutional:      Comments: Extremely pleasant elderly female alert and oriented in no acute distress.  HENT:     Head: Normocephalic.  Pulmonary:     Effort: No respiratory distress.  Musculoskeletal:     Comments: She does have some right greater than left lower extremity swelling with 2-3+ pretibial edema.  Right knee swelling with small effusion.  Joint line tender.  No signs of infection.  Calf nontender.  Skin:    General: Skin is warm and dry.  Neurological:     Mental Status: She is alert and oriented to person, place, and time.     Ortho Exam  Specialty Comments:  No specialty comments available.  Imaging: No results found.   PMFS History: Patient Active Problem List   Diagnosis Date Noted  . Pacemaker 07/16/2017  . Atrial fibrillation with slow ventricular response (HCC) 06/27/2017  . Hypercholesterolemia 06/27/2017  . Symptomatic bradycardia 06/27/2017  . Chronic pain of right knee 05/17/2016  . De Quervain's tenosynovitis 05/17/2016  . CAD (coronary artery disease) of artery bypass graft 02/13/2015  . Encounter for loop recorder check 02/01/2014  . Accelerated junctional rhythm 05/04/2013  . Dehydration 10/14/2012  . Syncope and collapse 10/13/2012  . History of first degree atrioventricular  block 10/11/2012  . Long term (current) use of anticoagulants 10/06/2012  . AVB - beta blocker and Amiodarone stopped 07/10/2012  . Hypertension   . Hx of CABG X 4 3/06-    . Dyslipidemia   . RCA DES placed 7/12- patent 8/14   . Permanent atrial fibrillation   . Chronic kidney disease, stage 3 (HCC)    Past Medical History:  Diagnosis Date  . Chronic kidney disease, stage 3 (HCC)   . Coronary artery disease   . Hx of CABG 2006   LIMA-LAD, free RIMA-PDA, Radial-OM1-OM2  . Hyperlipidemia   . Hypertension   . Hypothyroid   . MI, old       . PAF (paroxysmal atrial fibrillation) (HCC)    was on amio, d/c'd due to prolonged PR interval, rate  control  . Presence of permanent cardiac pacemaker 07/16/2017  . Presence of stent in right coronary artery 07/12   RIMA-PDA occluded, DES stent placed  . Syncope 07/12   bradycardic, beta blocker decreased, also felt to be dehydrated    Family History  Problem Relation Age of Onset  . CAD Mother   . CAD Maternal Grandmother     Past Surgical History:  Procedure Laterality Date  . CARDIAC CATHETERIZATION  211941   dr. Onalee Hua harding, revealing a atretic bypass to her right with patent distal RCA stent  . CAROTID STENT  2012  . CORONARY ARTERY BYPASS GRAFT  2006   LIMA-LAD, free RIMA-PDA, Radial-OM1-OM2  . DOPPLER ECHOCARDIOGRAPHY  [id number]   mild asymmetric left ventricular hypertrophy, left ventricular systolic function is low normal, ejection fraction = 50-55%, the LA is moderate dilated, the RA is mildly dilated, no significant valvular disease  . INSERT / REPLACE / REMOVE PACEMAKER  07/16/2017  . JOINT REPLACEMENT     hip replacement  . LEFT HEART CATHETERIZATION WITH CORONARY/GRAFT ANGIOGRAM N/A 10/12/2012   Procedure: LEFT HEART CATHETERIZATION WITH Isabel Caprice;  Surgeon: Marykay Lex, MD;  Location: [facility] CATH LAB;  Service: Cardiovascular;  Laterality: N/A;  . LOOP RECORDER IMPLANT N/A 10/15/2012   Procedure: LOOP RECORDER IMPLANT;  Surgeon: Thurmon Fair, MD;  Location: [facility] CATH LAB;  Service: Cardiovascular;  Laterality: N/A;  . LOOP RECORDER REMOVAL  07/16/2017  . LOOP RECORDER REMOVAL N/A 07/16/2017   Procedure: LOOP RECORDER REMOVAL;  Surgeon: Thurmon Fair, MD;  Location: [facility] INVASIVE CV LAB;  Service: Cardiovascular;  Laterality: N/A;  . NM MYOVIEW LTD  100510   post stress left ventricle is normal in size, post stress ejection fraction is 67% global left ventricular systolic function is normal, normal myocardial perfusion study, abnormal myocardial perfusion study, low risk scan  . PACEMAKER IMPLANT N/A 07/16/2017   Procedure: PACEMAKER IMPLANT;  Surgeon:  Thurmon Fair, MD;  Location: [facility] INVASIVE CV LAB;  Service: Cardiovascular;  Laterality: N/A;  . THYROIDECTOMY     Social History   Occupational History  . Not on file  Tobacco Use  . Smoking status: Never Smoker  . Smokeless tobacco: Current User    Types: Snuff  Substance and Sexual Activity  . Alcohol use: No  . Drug use: No  . Sexual activity: Not on file

## 2018-04-17 ENCOUNTER — Other Ambulatory Visit: Payer: Self-pay

## 2018-04-17 ENCOUNTER — Emergency Department (HOSPITAL_COMMUNITY)
Admission: EM | Admit: 2018-04-17 | Discharge: 2018-04-18 | Disposition: A | Discharge status: Home / Self Care | Payer: Medicare Other | Attending: Emergency Medicine | Admitting: Emergency Medicine

## 2018-04-17 ENCOUNTER — Emergency Department (HOSPITAL_COMMUNITY): Payer: Medicare Other

## 2018-04-17 ENCOUNTER — Ambulatory Visit (INDEPENDENT_AMBULATORY_CARE_PROVIDER_SITE_OTHER): Payer: Medicare Other

## 2018-04-17 DIAGNOSIS — E785 Hyperlipidemia, unspecified: Secondary | ICD-10-CM | POA: Diagnosis not present

## 2018-04-17 DIAGNOSIS — R001 Bradycardia, unspecified: Secondary | ICD-10-CM | POA: Diagnosis not present

## 2018-04-17 DIAGNOSIS — Z950 Presence of cardiac pacemaker: Secondary | ICD-10-CM

## 2018-04-17 DIAGNOSIS — R5383 Other fatigue: Secondary | ICD-10-CM | POA: Insufficient documentation

## 2018-04-17 DIAGNOSIS — I129 Hypertensive chronic kidney disease with stage 1 through stage 4 chronic kidney disease, or unspecified chronic kidney disease: Secondary | ICD-10-CM | POA: Insufficient documentation

## 2018-04-17 DIAGNOSIS — Z79899 Other long term (current) drug therapy: Secondary | ICD-10-CM | POA: Insufficient documentation

## 2018-04-17 DIAGNOSIS — N183 Chronic kidney disease, stage 3 (moderate): Secondary | ICD-10-CM | POA: Insufficient documentation

## 2018-04-17 DIAGNOSIS — I2510 Atherosclerotic heart disease of native coronary artery without angina pectoris: Secondary | ICD-10-CM | POA: Diagnosis not present

## 2018-04-17 DIAGNOSIS — I4891 Unspecified atrial fibrillation: Secondary | ICD-10-CM | POA: Diagnosis not present

## 2018-04-17 LAB — URINALYSIS, ROUTINE W REFLEX MICROSCOPIC
Bacteria, UA: NONE SEEN
Bilirubin Urine: NEGATIVE
Glucose, UA: NEGATIVE mg/dL
Ketones, ur: NEGATIVE mg/dL
Leukocytes,Ua: NEGATIVE
Nitrite: NEGATIVE
Protein, ur: NEGATIVE mg/dL
Specific Gravity, Urine: 1.002 — ABNORMAL LOW (ref 1.005–1.030)
pH: 6 (ref 5.0–8.0)

## 2018-04-17 NOTE — ED Notes (Signed)
Bed: [facility] Expected date:  Expected time:  Means of arrival:  Comments: EMS 85 yo female

## 2018-04-17 NOTE — ED Notes (Signed)
Bed: [facility] Expected date:  Expected time:  Means of arrival:  Comments: [facility]  

## 2018-04-17 NOTE — ED Triage Notes (Signed)
Pt BIB [facility] from home where she lives alone. Started on antibiotics on Wed for a UTI. She now reports lack of energy and malaise. Denies any SOB, chest pain, or AMS. Family wanted her to be checked out.

## 2018-04-17 NOTE — ED Provider Notes (Signed)
[facility]-EMERGENCY DEPT Provider Note   CSN: [id number] Arrival date & time: 04/17/18  2132    History   Chief Complaint Chief Complaint  Patient presents with  . Fatigue    HPI Stephanie Atkinson is a 85 y.o. female.     HPI Patient presents to the emergency department with general lack of energy and malaise.  The patient states that this started Wednesday after starting Macrobid.  The patient states she was being treated for urinary tract infection.  Patient states that nothing seems to make the condition better or worse.  The patient denies chest pain, shortness of breath, headache,blurred vision, neck pain, fever, cough, weakness, numbness, dizziness, anorexia, edema, abdominal pain, nausea, vomiting, diarrhea, rash, back pain, dysuria, hematemesis, bloody stool, near syncope, or syncope. Past Medical History:  Diagnosis Date  . Chronic kidney disease, stage 3 (HCC)   . Coronary artery disease   . Hx of CABG 2006   LIMA-LAD, free RIMA-PDA, Radial-OM1-OM2  . Hyperlipidemia   . Hypertension   . Hypothyroid   . MI, old       . PAF (paroxysmal atrial fibrillation) (HCC)    was on amio, d/c'd due to prolonged PR interval, rate control  . Presence of permanent cardiac pacemaker 07/16/2017  . Presence of stent in right coronary artery 07/12   RIMA-PDA occluded, DES stent placed  . Syncope 07/12   bradycardic, beta blocker decreased, also felt to be dehydrated    Patient Active Problem List   Diagnosis Date Noted  . Pacemaker 07/16/2017  . Atrial fibrillation with slow ventricular response (HCC) 06/27/2017  . Hypercholesterolemia 06/27/2017  . Symptomatic bradycardia 06/27/2017  . Chronic pain of right knee 05/17/2016  . De Quervain's tenosynovitis 05/17/2016  . CAD (coronary artery disease) of artery bypass graft 02/13/2015  . Encounter for loop recorder check 02/01/2014  . Accelerated junctional rhythm 05/04/2013  . Dehydration 10/14/2012  .  Syncope and collapse 10/13/2012  . History of first degree atrioventricular block 10/11/2012  . Long term (current) use of anticoagulants 10/06/2012  . AVB - beta blocker and Amiodarone stopped 07/10/2012  . Hypertension   . Hx of CABG X 4 3/06-    . Dyslipidemia   . RCA DES placed 7/12- patent 8/14   . Permanent atrial fibrillation   . Chronic kidney disease, stage 3 (HCC)     Past Surgical History:  Procedure Laterality Date  . CARDIAC CATHETERIZATION  299371   dr. Onalee Hua harding, revealing a atretic bypass to her right with patent distal RCA stent  . CAROTID STENT  2012  . CORONARY ARTERY BYPASS GRAFT  2006   LIMA-LAD, free RIMA-PDA, Radial-OM1-OM2  . DOPPLER ECHOCARDIOGRAPHY  696789   mild asymmetric left ventricular hypertrophy, left ventricular systolic function is low normal, ejection fraction = 50-55%, the LA is moderate dilated, the RA is mildly dilated, no significant valvular disease  . INSERT / REPLACE / REMOVE PACEMAKER  07/16/2017  . JOINT REPLACEMENT     hip replacement  . LEFT HEART CATHETERIZATION WITH CORONARY/GRAFT ANGIOGRAM N/A 10/12/2012   Procedure: LEFT HEART CATHETERIZATION WITH Isabel Caprice;  Surgeon: Marykay Lex, MD;  Location: [facility] C[facility] LAB;  Service: Cardiovascular;  Laterality: N/A;  . LOOP RECORDER IMPLANT N/A 10/15/2012   Procedure: LOOP RECORDER IMPLANT;  Surgeon: Thurmon Fair, MD;  Location: [facility] CATH LAB;  Service: Cardiovascular;  Laterality: N/A;  . LOOP RECORDER REMOVAL  07/16/2017  . LOOP RECORDER REMOVAL N/A 07/16/2017   Procedure:  LOOP RECORDER REMOVAL;  Surgeon: Thurmon Fair, MD;  Location: [facility] INVASIVE CV LAB;  Service: Cardiovascular;  Laterality: N/A;  . NM MYOVIEW LTD  100510   post stress left ventricle is normal in size, post stress ejection fraction is 67% global left ventricular systolic function is normal, normal myocardial perfusion study, abnormal myocardial perfusion study, low risk scan  . PACEMAKER IMPLANT N/A  07/16/2017   Procedure: PACEMAKER IMPLANT;  Surgeon: Thurmon Fair, MD;  Location: [facility] INVASIVE CV LAB;  Service: Cardiovascular;  Laterality: N/A;  . THYROIDECTOMY       OB History   No obstetric history on file.      Home Medications    Prior to Admission medications   Medication Sig Start Date End Date Taking? Authorizing Provider  acetaminophen (TYLENOL) 500 MG tablet Take 500-1,000 mg by mouth every 6 (six) hours as needed for moderate pain.   Yes [provider]  amLODipine (NORVASC) 10 MG tablet Take 1 tablet (10 mg total) by mouth daily. 11/18/17 04/17/18 Yes Croitoru, Mihai, MD  atorvastatin (LIPITOR) 80 MG tablet Take 80 mg by mouth daily.   Yes [provider]  Calcium-Vit D-Arg-Inos-Silicon (BONE DENSITY) 300-200 MG-UNIT TABS Take 1 tablet by mouth daily.   Yes [provider]  cholecalciferol (VITAMIN D3) 25 MCG (1000 UT) tablet Take 1,000 Units by mouth daily.   Yes [provider]  clobetasol cream (TEMOVATE) 0.05 % Apply 1 application topically 3 (three) times daily as needed (to legs).  07/12/16  Yes [provider]  diclofenac sodium (VOLTAREN) 1 % GEL Apply 4 g topically 4 (four) times daily. Patient taking differently: Apply 4 g topically 4 (four) times daily as needed (pain).  07/10/17  Yes Palumbo, April, MD  ELIQUIS 5 MG TABS tablet TAKE 1 TABLET BY MOUTH TWICE A DAY Patient taking differently: Take 5 mg by mouth 2 (two) times daily.  02/03/18  Yes Runell Gess, MD  furosemide (LASIX) 40 MG tablet Take 1 tablet (40 mg total) by mouth daily. 11/09/14  Yes Barrett, Joline Salt, PA-C  hydroxypropyl methylcellulose / hypromellose (ISOPTO TEARS / GONIOVISC) 2.5 % ophthalmic solution Place 1 drop into both eyes 3 (three) times daily as needed for dry eyes.   Yes [provider]  irbesartan (AVAPRO) 300 MG tablet Take 300 mg by mouth daily. 12/05/16  Yes [provider]  levothyroxine (SYNTHROID, LEVOTHROID) 50  MCG tablet Take 50 mcg by mouth daily.   Yes [provider]  methocarbamol (ROBAXIN) 500 MG tablet Take 1 tablet (500 mg total) by mouth 2 (two) times daily. 07/10/17  Yes Palumbo, April, MD  nitrofurantoin (MACRODANTIN) 100 MG capsule Take 100 mg by mouth 2 (two) times daily.  04/15/18  Yes [provider]  traMADol (ULTRAM) 50 MG tablet Take 50 mg by mouth 2 (two) times daily.    Yes [provider]  valsartan (DIOVAN) 160 MG tablet Take 160 mg by mouth daily. 03/06/18  Yes [provider]  meclizine (ANTIVERT) 25 MG tablet Take 1 tablet (25 mg total) by mouth 3 (three) times daily as needed for dizziness. Patient not taking: Reported on 04/17/2018 11/30/16   Vanetta Mulders, MD  nitroGLYCERIN (NITROSTAT) 0.4 MG SL tablet Place 0.4 mg under the tongue every 5 (five) minutes as needed. For chest pain    [provider]    Family History Family History  Problem Relation Age of Onset  . CAD Mother   . CAD Maternal Grandmother  Social History Social History   Tobacco Use  . Smoking status: Never Smoker  . Smokeless tobacco: Current User    Types: Snuff  Substance Use Topics  . Alcohol use: No  . Drug use: No     Allergies   Penicillins and Sulfa antibiotics   Review of Systems Review of Systems  All other systems negative except as documented in the HPI. All pertinent positives and negatives as reviewed in the HPI. Physical Exam Updated Vital Signs BP (!) 141/102 (BP Location: Left Arm)   Pulse 61   Resp 20   SpO2 94%   Physical Exam Vitals signs and nursing note reviewed.  Constitutional:      General: She is not in acute distress.    Appearance: She is well-developed.  HENT:     Head: Normocephalic and atraumatic.  Eyes:     Pupils: Pupils are equal, round, and reactive to light.  Neck:     Musculoskeletal: Normal range of motion and neck supple.  Cardiovascular:     Rate and Rhythm: Normal rate and regular rhythm.       Heart sounds: Normal heart sounds. No murmur. No friction rub. No gallop.   Pulmonary:     Effort: Pulmonary effort is normal. No respiratory distress.     Breath sounds: Normal breath sounds. No wheezing.  Abdominal:     General: Bowel sounds are normal. There is no distension.     Palpations: Abdomen is soft.     Tenderness: There is no abdominal tenderness.  Skin:    General: Skin is warm and dry.     Capillary Refill: Capillary refill takes less than 2 seconds.     Findings: No erythema or rash.  Neurological:     Mental Status: She is alert and oriented to person, place, and time.     Motor: No abnormal muscle tone.     Coordination: Coordination normal.  Psychiatric:        Behavior: Behavior normal.      ED Treatments / Results  Labs (all labs ordered are listed, but only abnormal results are displayed) Labs Reviewed  URINALYSIS, ROUTINE W REFLEX MICROSCOPIC - Abnormal; Notable for the following components:      Result Value   Color, Urine STRAW (*)    Specific Gravity, Urine 1.002 (*)    Hgb urine dipstick SMALL (*)    All other components within normal limits  URINE CULTURE  COMPREHENSIVE METABOLIC PANEL  CBC WITH DIFFERENTIAL/PLATELET    EKG EKG Interpretation  Date/Time:  Friday April 17 2018 23:02:38 EST Ventricular Rate:  63 PR Interval:    QRS Duration: 154 QT Interval:  577 QTC Calculation: 582 R Axis:   -66 Text Interpretation:  Atrial fibrillation Left bundle branch block t wave inversions seen on prior, afib on prior EKGs Confirmed by Virgina NorfolkAdam, [place] [phone]([id number]) on 04/17/2018 11:11:47 PM   Radiology No results found.  Procedures Procedures (including critical care time)  Medications Ordered in ED Medications - No data to display   Initial Impression / Assessment and Plan / ED Course  I have reviewed the triage vital signs and the nursing notes.  Pertinent labs & imaging results that were available during my care of the patient were  reviewed by me and considered in my medical decision making (see chart for details).       Patient will await laboratory testing.  There is no signs of urinary tract infection at this point.  Patient has  been stable thus far in the emergency department.  Final Clinical Impressions(s) / ED Diagnoses   Final diagnoses:  None    ED Discharge Orders    None       Charlestine Night, PA-C 04/18/18 0004    Virgina Norfolk, DO 04/25/18 [phone]

## 2018-04-18 LAB — COMPREHENSIVE METABOLIC PANEL WITH GFR
ALT: 21 U/L (ref 0–44)
AST: 21 U/L (ref 15–41)
Albumin: 4.1 g/dL (ref 3.5–5.0)
Alkaline Phosphatase: 72 U/L (ref 38–126)
Anion gap: 7 (ref 5–15)
BUN: 20 mg/dL (ref 8–23)
CO2: 23 mmol/L (ref 22–32)
Calcium: 10.2 mg/dL (ref 8.9–10.3)
Chloride: 106 mmol/L (ref 98–111)
Creatinine, Ser: 1.25 mg/dL — ABNORMAL HIGH (ref 0.44–1.00)
GFR calc Af Amer: 44 mL/min — ABNORMAL LOW (ref >60)
GFR calc non Af Amer: 38 mL/min — ABNORMAL LOW (ref >60)
Glucose, Bld: 102 mg/dL — ABNORMAL HIGH (ref 70–99)
Potassium: 3.7 mmol/L (ref 3.5–5.1)
Sodium: 136 mmol/L (ref 135–145)
Total Bilirubin: 1.2 mg/dL (ref 0.3–1.2)
Total Protein: 7.2 g/dL (ref 6.5–8.1)

## 2018-04-18 LAB — CUP PACEART REMOTE DEVICE CHECK
Battery Remaining Longevity: 138 mo
Battery Voltage: 3.05 V
Brady Statistic RV Percent Paced: 97.95 %
Date Time Interrogation Session: 20200221112520
Implantable Lead Implant Date: 20190522
Implantable Lead Model: 5076
Implantable Pulse Generator Implant Date: 20190522
Lead Channel Impedance Value: 342 Ohm
Lead Channel Pacing Threshold Amplitude: 0.625 V
Lead Channel Pacing Threshold Pulse Width: 0.4 ms
Lead Channel Sensing Intrinsic Amplitude: 6.375 mV
Lead Channel Sensing Intrinsic Amplitude: 6.375 mV
Lead Channel Setting Pacing Pulse Width: 0.4 ms
Lead Channel Setting Sensing Sensitivity: 1.2 mV
MDC IDC LEAD LOCATION: 753860
MDC IDC MSMT LEADCHNL RV IMPEDANCE VALUE: 475 Ohm
MDC IDC SET LEADCHNL RV PACING AMPLITUDE: 2.5 V

## 2018-04-18 LAB — CBC WITH DIFFERENTIAL/PLATELET
Abs Immature Granulocytes: 0.02 10*3/uL (ref 0.00–0.07)
Basophils Absolute: 0 10*3/uL (ref 0.0–0.1)
Basophils Relative: 0 %
Eosinophils Absolute: 0.1 10*3/uL (ref 0.0–0.5)
Eosinophils Relative: 1 %
HCT: 42.5 % (ref 36.0–46.0)
Hemoglobin: 13.1 g/dL (ref 12.0–15.0)
Immature Granulocytes: 0 %
Lymphocytes Relative: 9 %
Lymphs Abs: 0.6 10*3/uL — ABNORMAL LOW (ref 0.7–4.0)
MCH: 27.7 pg (ref 26.0–34.0)
MCHC: 30.8 g/dL (ref 30.0–36.0)
MCV: 89.9 fL (ref 80.0–100.0)
Monocytes Absolute: 0.5 10*3/uL (ref 0.1–1.0)
Monocytes Relative: 7 %
Neutro Abs: 5.9 10*3/uL (ref 1.7–7.7)
Neutrophils Relative %: 83 %
Platelets: 153 10*3/uL (ref 150–400)
RBC: 4.73 MIL/uL (ref 3.87–5.11)
RDW: 13 % (ref 11.5–15.5)
WBC: 7.2 10*3/uL (ref 4.0–10.5)
nRBC: 0 % (ref 0.0–0.2)

## 2018-04-18 LAB — TROPONIN I: Troponin I: <0.03 ng/mL (ref <0.03)

## 2018-04-18 MED ORDER — SODIUM CHLORIDE 0.9 % IV BOLUS
250.0000 mL | Freq: Once | INTRAVENOUS | Status: AC
  Administered 2018-04-18: 250 mL via INTRAVENOUS

## 2018-04-18 NOTE — ED Provider Notes (Signed)
3:10 AM Patient care assumed from [facility], PA-C at change of shift.  Patient presenting for fatigue and malaise.  Noticed her symptoms shortly after starting Macrobid for treatment of presumed UTI.  Labs pending at shift change which have been reviewed.  She has no leukocytosis or electrolyte derangements today.  Liver and kidney function consistent with baseline.  Troponin is normal.  Patient with history of PAF.  Is chronically anticoagulated on Eliquis.  She has rate controlled atrial fibrillation on her EKG today.  Chest x-ray without acute cardiopulmonary process  Patient feels better on repeat assessment.  She is currently drinking oral fluids.  Her vitals have remained stable.  I do not feel further emergent work-up is indicated.  The patient has been encouraged to follow-up with her primary care doctor.  I do not see any reason for her to continue Macrobid.  Return precautions discussed and provided. Patient discharged in stable condition with no unaddressed concerns.  Vitals:   04/17/18 2228 04/18/18 0056 04/18/18 0240  BP: (!) 141/102 (!) 155/52 (!) 148/67  Pulse: 61 63 60  Resp: 20 17 16   SpO2: 94% 94% 94%      Antony Madura, PA-C 04/18/18 0313    Palumbo, April, MD 04/18/18 5397

## 2018-04-18 NOTE — Discharge Instructions (Signed)
Your work-up in the emergency department today has been reassuring.  We recommend that you discontinue use of Macrobid as your urine did not suggest persistent infection.  Be sure to drink plenty of fluids to prevent dehydration.  Follow-up with your primary care doctor for recheck.

## 2018-04-18 NOTE — ED Notes (Signed)
Attempted to obtain Orthostatic vital signs, but pt was asleep with purewick. Updated vitals signs noted.

## 2018-04-19 LAB — URINE CULTURE: Culture: <10000 — AB

## 2018-04-23 NOTE — Progress Notes (Signed)
Remote pacemaker transmission.   

## 2018-04-24 ENCOUNTER — Encounter: Payer: Self-pay | Admitting: Cardiology

## 2018-05-14 ENCOUNTER — Telehealth: Payer: Self-pay

## 2018-05-14 NOTE — Telephone Encounter (Signed)
   Primary Cardiologist: Nanetta Batty, MD   Pt contacted.  History and symptoms reviewed.  Pt will f/u with [facility] provider as scheduled. Pt. advised that we are restricting visitors at this time and request that only patients present for check-in prior to their appointment.  All other visitors should remain in their car.  If necessary, only one visitor may come with the patient, into the building. For everyone's safety, all patients and visitor entering our practice area should expect to be screened again prior to entering our waiting area.  Ian Bushman, [facility]  05/14/2018 4:54 PM    Patient was also scheduled for Dr Allyson Sabal @ 10:15a. Advised patient that both of these appointments were probably not necessary given the circumstances currently. Patient opted to see Dr C because of the pacemaker and will defer follow-up (if deemed necessary) can be recommended at Dr [facility] discretion. Patient voiced understanding and agreed with plan. Her appointment w/ Dr Allyson Sabal has been cancelled.

## 2018-05-14 NOTE — Telephone Encounter (Signed)
   Cardiac Questionnaire:    Since your last visit or hospitalization:    1. Have you been having new or worsening chest pain? NO   2. Have you been having new or worsening shortness of breath? YES 3. Have you been having new or worsening leg swelling, wt gain, or increase in abdominal girth (pants fitting more tightly)? YES   4. Have you had any passing out spells? NO    *A YES to any of these questions would result in the appointment being kept. *If all the answers to these questions are NO, we should indicate that given the current situation regarding the worldwide coronarvirus pandemic, at the recommendation of the CDC, we are looking to limit gatherings in our waiting area, and thus will reschedule their appointment beyond four weeks from today.   _____________   COVID-19 Pre-Screening Questions:  . Do you currently have a fever? NO (yes = cancel and refer to pcp for e-visit) . Have you recently travelled on a cruise, internationally, or to [place], [place], [place], [place], [place], or [place], [place] ([employer]) ? NO (yes = cancel, stay home, monitor symptoms, and contact pcp or initiate e-visit if symptoms develop) . Have you been in contact with someone that is currently pending confirmation of Covid19 testing or has been confirmed to have the Covid19 virus?  NO  (yes = cancel, stay home, away from tested individual, monitor symptoms, and contact pcp or initiate e-visit if symptoms develop) . Are you currently experiencing fatigue or cough? NO (yes = pt should be prepared to have a mask placed at the time of their visit).          

## 2018-05-19 ENCOUNTER — Telehealth: Payer: Self-pay

## 2018-05-19 NOTE — Telephone Encounter (Signed)
Pt contacted to verify she is feeling well and does not need to see provider tomorrow. Pt stated she feels fine and provider has reviewed her last PPM download (04/17/2018). Pt agrees to cancel appt. For 05/20/2018 and be seen in 3 months for followup. Pt understands to contact us or 911 if there are issues. No additional questions or concerns at this time.

## 2018-05-20 ENCOUNTER — Encounter: Payer: Medicare Other | Admitting: Cardiovascular Disease

## 2018-05-20 ENCOUNTER — Ambulatory Visit: Payer: Medicare Other | Admitting: Cardiovascular Disease

## 2018-06-10 ENCOUNTER — Telehealth (INDEPENDENT_AMBULATORY_CARE_PROVIDER_SITE_OTHER): Payer: Medicare Other | Admitting: Cardiovascular Disease

## 2018-06-10 ENCOUNTER — Encounter: Payer: Self-pay | Admitting: Cardiovascular Disease

## 2018-06-10 ENCOUNTER — Telehealth: Payer: Self-pay

## 2018-06-10 ENCOUNTER — Telehealth: Payer: Self-pay | Admitting: Cardiovascular Disease

## 2018-06-10 DIAGNOSIS — E7800 Pure hypercholesterolemia, unspecified: Secondary | ICD-10-CM

## 2018-06-10 DIAGNOSIS — I4821 Permanent atrial fibrillation: Secondary | ICD-10-CM

## 2018-06-10 DIAGNOSIS — E785 Hyperlipidemia, unspecified: Secondary | ICD-10-CM

## 2018-06-10 DIAGNOSIS — R001 Bradycardia, unspecified: Secondary | ICD-10-CM

## 2018-06-10 DIAGNOSIS — Z950 Presence of cardiac pacemaker: Secondary | ICD-10-CM

## 2018-06-10 DIAGNOSIS — I4430 Unspecified atrioventricular block: Secondary | ICD-10-CM

## 2018-06-10 DIAGNOSIS — I25810 Atherosclerosis of coronary artery bypass graft(s) without angina pectoris: Secondary | ICD-10-CM

## 2018-06-10 DIAGNOSIS — Z955 Presence of coronary angioplasty implant and graft: Secondary | ICD-10-CM

## 2018-06-10 DIAGNOSIS — Z951 Presence of aortocoronary bypass graft: Secondary | ICD-10-CM

## 2018-06-10 DIAGNOSIS — I4891 Unspecified atrial fibrillation: Secondary | ICD-10-CM | POA: Diagnosis not present

## 2018-06-10 DIAGNOSIS — I10 Essential (primary) hypertension: Secondary | ICD-10-CM

## 2018-06-10 NOTE — Telephone Encounter (Signed)
Smartphone/no mychart/consent obtained/pre reg complete/dc05.15.2020

## 2018-06-10 NOTE — Progress Notes (Signed)
Virtual Visit via Telephone Note   This visit type was conducted due to national recommendations for restrictions regarding the COVID-19 Pandemic (e.g. social distancing) in an effort to limit this patient's exposure and mitigate transmission in our community.  Due to her co-morbid illnesses, this patient is at least at moderate risk for complications without adequate follow up.  This format is felt to be most appropriate for this patient at this time.  The patient did not have access to video technology/had technical difficulties with video requiring transitioning to audio format only (telephone).  All issues noted in this document were discussed and addressed.  No physical exam could be performed with this format.  Please refer to the patient's chart for her  consent to telehealth for [facility].   Evaluation Performed:  Follow-up visit  Date:  06/10/2018   ID:  Stephanie Atkinson, DOB 07-13-1924, MRN [id number]  Patient Location: Home Provider Location: Home  PCP:  Pearson Grippe, MD  Cardiologist:  Nanetta Batty, MD  Electrophysiologist:  None   Chief Complaint:   1 year follow-up of CAD and atrial fibrillation  History of Present Illness:    Stephanie Atkinson is a 85y.o.  widowed Caucasian female mother of 7 children, grandmother of 109 grandchildren who I last saw in the office 05/20/2017.   She has a history of CAD who I last saw in the office6/30/18.[name] She had CABG X 4 in 3/06. She had a DES placed to her PDA in July 2012, this was patent on follow up cath in Oct 2012 and recently 10/12/12. She did have distal disease that is to be treated medically. She h in the office 07/10/12 and was noted to have a long 1st degree AVB on her EKG, her PR was > 4. She was asymptomatic. She has a history of PAF since her CABG in 2006 and had been on Toprol, Amiodarone, ASA, and Plavix. At that time we stopped her Toprol and decreased her Amiodarone. When she was seen in follow up 07/29/12 her PR remained prolonged  her Amiodarone was discontinued. On 10/06/12 she was seen and noted to be in AF with CVR. She was placed on Eloquis and her Plavix was Stopped. She the was admitted a few days later with chest pain with subtle EKG changes. She was cathed 10/12/12 and her anatomy was unchanged. She was discharge but readmitted later the 19th after a syncopal spell. She had a loop recorder implanted 10/14/12. She was noted to be mildly dehydrated and this, as well as new nitrate therapy, may have contributed to her syncope.  Since I saw her a year ago she was complaining of fatigue and had a slow ventricular response to her atrial fibrillation despite being on no negative chronotropic drugs.  Because of increasing dyspnea on exertion and her slow heart rate she underwent a permanent transvenous pacemaker implant by Dr. Royann Shivers O5/22/19 Medtronic Azure SR.  She feels clinically improved.  She currently denies chest pain or shortness of breath.  She lives alone but she has 2 of her children in close proximity.  She eats a healthy diet and exercises daily by walking in the neighborhood. The patient does not have symptoms concerning for COVID-19 infection (fever, chills, cough, or new shortness of breath).    Past Medical History:  Diagnosis Date  . Chronic kidney disease, stage 3 (HCC)   . Coronary artery disease   . Hx of CABG 2006   LIMA-LAD, free RIMA-PDA, Radial-OM1-OM2  .  Hyperlipidemia   . Hypertension   . Hypothyroid   . MI, old       . PAF (paroxysmal atrial fibrillation) (HCC)    was on amio, d/c'd due to prolonged PR interval, rate control  . Presence of permanent cardiac pacemaker 07/16/2017  . Presence of stent in right coronary artery 07/12   RIMA-PDA occluded, DES stent placed  . Syncope 07/12   bradycardic, beta blocker decreased, also felt to be dehydrated   Past Surgical History:  Procedure Laterality Date  . CARDIAC CATHETERIZATION  336122   dr. Onalee Hua harding, revealing a atretic bypass to her  right with patent distal RCA stent  . CAROTID STENT  2012  . CORONARY ARTERY BYPASS GRAFT  2006   LIMA-LAD, free RIMA-PDA, Radial-OM1-OM2  . DOPPLER ECHOCARDIOGRAPHY  449753   mild asymmetric left ventricular hypertrophy, left ventricular systolic function is low normal, ejection fraction = 50-55%, the LA is moderate dilated, the RA is mildly dilated, no significant valvular disease  . INSERT / REPLACE / REMOVE PACEMAKER  07/16/2017  . JOINT REPLACEMENT     hip replacement  . LEFT HEART CATHETERIZATION WITH CORONARY/GRAFT ANGIOGRAM N/A 10/12/2012   Procedure: LEFT HEART CATHETERIZATION WITH Isabel Caprice;  Surgeon: Marykay Lex, MD;  Location: [facility] CATH LAB;  Service: Cardiovascular;  Laterality: N/A;  . LOOP RECORDER IMPLANT N/A 10/15/2012   Procedure: LOOP RECORDER IMPLANT;  Surgeon: Thurmon Fair, MD;  Location: [facility] CATH LAB;  Service: Cardiovascular;  Laterality: N/A;  . LOOP RECORDER REMOVAL  07/16/2017  . LOOP RECORDER REMOVAL N/A 07/16/2017   Procedure: LOOP RECORDER REMOVAL;  Surgeon: Thurmon Fair, MD;  Location: [facility] INVASIVE CV LAB;  Service: Cardiovascular;  Laterality: N/A;  . NM MYOVIEW LTD  100510   post stress left ventricle is normal in size, post stress ejection fraction is 67% global left ventricular systolic function is normal, normal myocardial perfusion study, abnormal myocardial perfusion study, low risk scan  . PACEMAKER IMPLANT N/A 07/16/2017   Procedure: PACEMAKER IMPLANT;  Surgeon: Thurmon Fair, MD;  Location: [facility] INVASIVE CV LAB;  Service: Cardiovascular;  Laterality: N/A;  . THYROIDECTOMY       Current Meds  Medication Sig  . acetaminophen (TYLENOL) 500 MG tablet Take 500-1,000 mg by mouth every 6 (six) hours as needed for moderate pain.  [name] amLODipine (NORVASC) 10 MG tablet Take 1 tablet (10 mg total) by mouth daily.  [name] atorvastatin (LIPITOR) 80 MG tablet Take 80 mg by mouth daily.  . Calcium-Vit D-Arg-Inos-Silicon (BONE DENSITY) 300-200 MG-UNIT TABS  Take 1 tablet by mouth daily.  . cholecalciferol (VITAMIN D3) 25 MCG (1000 UT) tablet Take 1,000 Units by mouth daily.  . clobetasol cream (TEMOVATE) 0.05 % Apply 1 application topically 3 (three) times daily as needed (to legs).   . diclofenac sodium (VOLTAREN) 1 % GEL Apply 4 g topically 4 (four) times daily. (Patient taking differently: Apply 4 g topically 4 (four) times daily as needed (pain). )  . ELIQUIS 5 MG TABS tablet TAKE 1 TABLET BY MOUTH TWICE A DAY (Patient taking differently: Take 5 mg by mouth 2 (two) times daily. )  . furosemide (LASIX) 40 MG tablet Take 1 tablet (40 mg total) by mouth daily.  . hydroxypropyl methylcellulose / hypromellose (ISOPTO TEARS / GONIOVISC) 2.5 % ophthalmic solution Place 1 drop into both eyes 3 (three) times daily as needed for dry eyes.  [name] irbesartan (AVAPRO) 300 MG tablet Take 300 mg by mouth daily.  [name] levothyroxine (SYNTHROID, LEVOTHROID)  50 MCG tablet Take 50 mcg by mouth daily.  . meclizine (ANTIVERT) 25 MG tablet Take 1 tablet (25 mg total) by mouth 3 (three) times daily as needed for dizziness.  . methocarbamol (ROBAXIN) 500 MG tablet Take 1 tablet (500 mg total) by mouth 2 (two) times daily.  . nitrofurantoin (MACRODANTIN) 100 MG capsule Take 100 mg by mouth 2 (two) times daily.   . nitroGLYCERIN (NITROSTAT) 0.4 MG SL tablet Place 0.4 mg under the tongue every 5 (five) minutes as needed. For chest pain  . traMADol (ULTRAM) 50 MG tablet Take 50 mg by mouth 2 (two) times daily.   . valsartan (DIOVAN) 160 MG tablet Take 160 mg by mouth daily.     Allergies:   Penicillins and Sulfa antibiotics   Social History   Tobacco Use  . Smoking status: Never Smoker  . Smokeless tobacco: Current User    Types: Snuff  Substance Use Topics  . Alcohol use: No  . Drug use: No     Family Hx: The patient's family history includes CAD in her maternal grandmother and mother.  ROS:   Please see the history of present illness.     All other systems  reviewed and are negative.   Prior CV studies:   The following studies were reviewed today:  None  Labs/Other Tests and Data Reviewed:    EKG:  No ECG reviewed.  Recent Labs: 04/18/2018: ALT 21; BUN 20; Creatinine, Ser 1.25; Hemoglobin 13.1; Platelets 153; Potassium 3.7; Sodium 136   Recent Lipid Panel Lab Results  Component Value Date/Time   CHOL 127 11/25/2016 10:05 AM   TRIG 117 11/25/2016 10:05 AM   HDL 46 11/25/2016 10:05 AM   CHOLHDL 2.8 11/25/2016 10:05 AM   CHOLHDL 2.6 01/05/2015 11:18 AM   LDLCALC 58 11/25/2016 10:05 AM    Wt Readings from Last 3 Encounters:  06/10/18 149 lb (67.6 kg)  03/17/18 153 lb (69.4 kg)  10/17/17 156 lb (70.8 kg)     Objective:    Vital Signs:  BP (!) 169/70   Pulse 86   Ht  (1.6 m)   Wt 149 lb (67.6 kg)   BMI 26.39 kg/m    A complete physical exam was not performed today since this was a telemedicine virtual phone visit.  ASSESSMENT & PLAN:    1. Coronary artery disease- history of CAD status post CABG x4 3/06 with a drug-eluting stent placed in the PDA July 2012, patent at follow-up cath 10/12/2012.  She denies chest pain or shortness of breath. 2. Essential hypertension- history of essential hypertension with blood pressure measured by the patient today of 129/65 with a pulse of 87.  She is on amlodipine and valsartan 3. Hyperlipidemia- history of hyperlipidemia on Lipitor with lipid profile performed 12/12/2017 revealing a total cholesterol of 120, LDL 72 and HDL 43. 4. Permanent atrial fibrillation- history of permanent atrial fibrillation with a slow ventricular response on Eliquis symptomatic from this last year status post permanent transvenous pacemaker implantation (Medtronic Azure SR device) by Dr. Royann Shivers who follows this remotely by [facility].  COVID-19 Education: The signs and symptoms of COVID-19 were discussed with the patient and how to seek care for testing (follow up with PCP or arrange E-visit).  The  importance of social distancing was discussed today.  Time:   Today, I have spent 15 minutes with the patient with telehealth technology discussing the above problems including chart review and documentation.   Medication Adjustments/Labs and Tests  Ordered: Current medicines are reviewed at length with the patient today.  Concerns regarding medicines are outlined above.   Tests Ordered: No orders of the defined types were placed in this encounter.   Medication Changes: No orders of the defined types were placed in this encounter.   Disposition:  Follow up in 1 year(s)  Signed, Nanetta BattyJonathan , MD  06/10/2018 3:27 PM    [facility]

## 2018-06-10 NOTE — Telephone Encounter (Signed)
Patient and/or DPR-approved person aware of AVS instructions and verbalized understanding. 

## 2018-06-10 NOTE — Patient Instructions (Addendum)

## 2018-06-27 ENCOUNTER — Emergency Department (HOSPITAL_COMMUNITY): Payer: Medicare Other

## 2018-06-27 ENCOUNTER — Emergency Department (HOSPITAL_COMMUNITY)
Admission: EM | Admit: 2018-06-27 | Discharge: 2018-06-27 | Disposition: A | Discharge status: Home / Self Care | Payer: Medicare Other | Attending: Emergency Medicine | Admitting: Emergency Medicine

## 2018-06-27 ENCOUNTER — Other Ambulatory Visit: Payer: Self-pay

## 2018-06-27 ENCOUNTER — Encounter (HOSPITAL_COMMUNITY): Payer: Self-pay

## 2018-06-27 DIAGNOSIS — Z950 Presence of cardiac pacemaker: Secondary | ICD-10-CM | POA: Insufficient documentation

## 2018-06-27 DIAGNOSIS — F17220 Nicotine dependence, chewing tobacco, uncomplicated: Secondary | ICD-10-CM | POA: Insufficient documentation

## 2018-06-27 DIAGNOSIS — E039 Hypothyroidism, unspecified: Secondary | ICD-10-CM | POA: Diagnosis not present

## 2018-06-27 DIAGNOSIS — Z951 Presence of aortocoronary bypass graft: Secondary | ICD-10-CM | POA: Insufficient documentation

## 2018-06-27 DIAGNOSIS — M5410 Radiculopathy, site unspecified: Secondary | ICD-10-CM | POA: Diagnosis not present

## 2018-06-27 DIAGNOSIS — Z7901 Long term (current) use of anticoagulants: Secondary | ICD-10-CM | POA: Insufficient documentation

## 2018-06-27 DIAGNOSIS — M545 Low back pain: Secondary | ICD-10-CM

## 2018-06-27 DIAGNOSIS — I2510 Atherosclerotic heart disease of native coronary artery without angina pectoris: Secondary | ICD-10-CM | POA: Diagnosis not present

## 2018-06-27 DIAGNOSIS — N183 Chronic kidney disease, stage 3 (moderate): Secondary | ICD-10-CM | POA: Insufficient documentation

## 2018-06-27 DIAGNOSIS — N3001 Acute cystitis with hematuria: Secondary | ICD-10-CM

## 2018-06-27 DIAGNOSIS — I129 Hypertensive chronic kidney disease with stage 1 through stage 4 chronic kidney disease, or unspecified chronic kidney disease: Secondary | ICD-10-CM | POA: Diagnosis not present

## 2018-06-27 DIAGNOSIS — Z96649 Presence of unspecified artificial hip joint: Secondary | ICD-10-CM | POA: Diagnosis not present

## 2018-06-27 DIAGNOSIS — Z79899 Other long term (current) drug therapy: Secondary | ICD-10-CM | POA: Insufficient documentation

## 2018-06-27 DIAGNOSIS — G8929 Other chronic pain: Secondary | ICD-10-CM | POA: Diagnosis not present

## 2018-06-27 DIAGNOSIS — I252 Old myocardial infarction: Secondary | ICD-10-CM | POA: Diagnosis not present

## 2018-06-27 LAB — COMPREHENSIVE METABOLIC PANEL
ALT: 22 U/L (ref 0–44)
AST: 23 U/L (ref 15–41)
Albumin: 4.1 g/dL (ref 3.5–5.0)
Alkaline Phosphatase: 87 U/L (ref 38–126)
Anion gap: 11 (ref 5–15)
BUN: 15 mg/dL (ref 8–23)
CO2: 22 mmol/L (ref 22–32)
Calcium: 11 mg/dL — ABNORMAL HIGH (ref 8.9–10.3)
Chloride: 108 mmol/L (ref 98–111)
Creatinine, Ser: 0.99 mg/dL (ref 0.44–1.00)
GFR calc Af Amer: 57 mL/min — ABNORMAL LOW (ref >60)
GFR calc non Af Amer: 49 mL/min — ABNORMAL LOW (ref >60)
Glucose, Bld: 93 mg/dL (ref 70–99)
Potassium: 3.7 mmol/L (ref 3.5–5.1)
Sodium: 141 mmol/L (ref 135–145)
Total Bilirubin: 0.9 mg/dL (ref 0.3–1.2)
Total Protein: 6.8 g/dL (ref 6.5–8.1)

## 2018-06-27 LAB — URINALYSIS, ROUTINE W REFLEX MICROSCOPIC
Bilirubin Urine: NEGATIVE
Glucose, UA: NEGATIVE mg/dL
Ketones, ur: NEGATIVE mg/dL
Nitrite: NEGATIVE
Protein, ur: NEGATIVE mg/dL
Specific Gravity, Urine: 1.005 (ref 1.005–1.030)
pH: 7 (ref 5.0–8.0)

## 2018-06-27 LAB — CBC WITH DIFFERENTIAL/PLATELET
Abs Immature Granulocytes: 0.01 10*3/uL (ref 0.00–0.07)
Basophils Absolute: 0 10*3/uL (ref 0.0–0.1)
Basophils Relative: 0 %
Eosinophils Absolute: 0 10*3/uL (ref 0.0–0.5)
Eosinophils Relative: 0 %
HCT: 40 % (ref 36.0–46.0)
Hemoglobin: 12.9 g/dL (ref 12.0–15.0)
Immature Granulocytes: 0 %
Lymphocytes Relative: 18 %
Lymphs Abs: 1 10*3/uL (ref 0.7–4.0)
MCH: 28.7 pg (ref 26.0–34.0)
MCHC: 32.3 g/dL (ref 30.0–36.0)
MCV: 88.9 fL (ref 80.0–100.0)
Monocytes Absolute: 0.4 10*3/uL (ref 0.1–1.0)
Monocytes Relative: 7 %
Neutro Abs: 4 10*3/uL (ref 1.7–7.7)
Neutrophils Relative %: 75 %
Platelets: 197 10*3/uL (ref 150–400)
RBC: 4.5 MIL/uL (ref 3.87–5.11)
RDW: 14.2 % (ref 11.5–15.5)
WBC: 5.4 10*3/uL (ref 4.0–10.5)
nRBC: 0 % (ref 0.0–0.2)

## 2018-06-27 LAB — LACTIC ACID, PLASMA
Lactic Acid, Venous: 1.4 mmol/L (ref 0.5–1.9)
Lactic Acid, Venous: 1.2 mmol/L (ref 0.5–1.9)

## 2018-06-27 MED ORDER — CIPROFLOXACIN HCL 500 MG PO TABS: 500.0000 mg | ORAL_TABLET | Freq: Two times a day (BID) | ORAL | 0 refills | Status: DC

## 2018-06-27 MED ORDER — KETOROLAC TROMETHAMINE 30 MG/ML IJ SOLN
30.0000 mg | Freq: Once | INTRAMUSCULAR | Status: AC
  Administered 2018-06-27: 30 mg via INTRAVENOUS
  Filled 2018-06-27: qty 1

## 2018-06-27 MED ORDER — IOHEXOL 300 MG/ML  SOLN
100.0000 mL | Freq: Once | INTRAMUSCULAR | Status: AC | PRN
  Administered 2018-06-27: 100 mL via INTRAVENOUS

## 2018-06-27 MED ORDER — AMLODIPINE BESYLATE 5 MG PO TABS
10.0000 mg | ORAL_TABLET | Freq: Every day | ORAL | Status: DC
  Administered 2018-06-27: 10 mg via ORAL
  Filled 2018-06-27: qty 2

## 2018-06-27 MED ORDER — LIDOCAINE 5 % EX PTCH: 1.0000 | MEDICATED_PATCH | CUTANEOUS | 0 refills | Status: DC

## 2018-06-27 MED ORDER — PREDNISONE 20 MG PO TABS
60.0000 mg | ORAL_TABLET | Freq: Once | ORAL | Status: AC
  Administered 2018-06-27: 60 mg via ORAL
  Filled 2018-06-27: qty 3

## 2018-06-27 MED ORDER — METHOCARBAMOL 500 MG PO TABS
500.0000 mg | ORAL_TABLET | Freq: Once | ORAL | Status: AC
  Administered 2018-06-27: 500 mg via ORAL
  Filled 2018-06-27: qty 1

## 2018-06-27 MED ORDER — SODIUM CHLORIDE 0.9 % IV BOLUS
500.0000 mL | Freq: Once | INTRAVENOUS | Status: AC
  Administered 2018-06-27: 500 mL via INTRAVENOUS

## 2018-06-27 NOTE — ED Notes (Addendum)
Pt's daughter [name]) updated on pt's status and discharge instructions per pt's request; daughter will be picking up pt in approx 20 minutes

## 2018-06-27 NOTE — ED Provider Notes (Signed)
Medical screening examination/treatment/procedure(s) were conducted as a shared visit with non-physician practitioner(s) and myself.  I personally evaluated the patient during the encounter. Briefly, the patient is a 85 yo F here w/ back pain, occasional tingling paresthesias b/l but R>L LE. No weakness, loss of bowel or bladder function, or signs of cauda equina or acute cord compression. I suspect this is acute on chronic radiculopathy 2/2 known DDD and spinal stenosis, but given age, concomitant urinary sx, will check CT and labs.   EKG Interpretation  Date/Time:  Saturday Jun 27 2018 08:07:37 EDT Ventricular Rate:  73 PR Interval:    QRS Duration: 153 QT Interval:  434 QTC Calculation: 479 R Axis:   -85 Text Interpretation:  Sinus rhythm Short PR interval Left bundle branch block No significant change since last tracing Confirmed by Shaune Pollack [phone]) on 06/27/2018 8:10:21 AM         Shaune Pollack, MD 06/27/18 [phone]

## 2018-06-27 NOTE — ED Notes (Signed)
Patient transported to CT 

## 2018-06-27 NOTE — ED Triage Notes (Signed)
Pt has had back pain since last night starting on the Rt side & then began to spread. She states that this morning she began having muscle spasms that went down both of her legs.She states that she sees an orthopedist for this & when she took a muscle relaxer this morning it did not relieve any symptoms.

## 2018-06-27 NOTE — ED Provider Notes (Signed)
[facility] EMERGENCY DEPARTMENT Provider Note   CSN: [id number] Arrival date & time: 06/27/18  0802    History   Chief Complaint Chief Complaint  Patient presents with  . Back Pain    HPI Stephanie Atkinson is a 85 y.o. female with past medical history significant for CKD, CAD, hypertension, hyperlipidemia, A. fib, chronic bradycardia s/p pacemaker who presents for evaluation of back pain.  Patient states she has had diffuse lower back pain which she describes as spasms since yesterday evening.  States she took a home muscle relaxer as well as tramadol which she takes for her chronic pain which has not helped.  Her pain radiates down her bilateral posterior legs into her buttocks, however worse on right side.  States she has had something previously "many years ago". Patient also states she has had dysuria as well as urinary frequency and left flank pain x2 days.  Was recently treated for a UTI 1 month ago for her PCP.  Patient states this feels similar.  Denies fever, chills, nausea, vomiting, headache, unilateral weakness, facial asymmetry, neck pain, neck stiffness, chest pain, shortness of breath, abdominal pain, diarrhea, constipation, rashes or lesions.  Back pain worse with movement, especially twisting.  Rates her current pain a 7/10.  Denies any injuries or trauma.  Denies central abdominal pain that radiates into her back. Denies drug use, bowel or bladder incontinence, saddle paresthesia, history of malignancy, chronic steroids, diabetes. Has been ambulatory at home with her cane which she uses at baseline.  History obtained from patient.  No interpreter was used.  Per EMS patient was febrile to 101.3.  Patient afebrile on arrival to emergency department without intervention.    HPI  Past Medical History:  Diagnosis Date  . Chronic kidney disease, stage 3 (HCC)   . Coronary artery disease   . Hx of CABG 2006   LIMA-LAD, free RIMA-PDA, Radial-OM1-OM2  .  Hyperlipidemia   . Hypertension   . Hypothyroid   . MI, old       . PAF (paroxysmal atrial fibrillation) (HCC)    was on amio, d/c'd due to prolonged PR interval, rate control  . Presence of permanent cardiac pacemaker 07/16/2017  . Presence of stent in right coronary artery 07/12   RIMA-PDA occluded, DES stent placed  . Syncope 07/12   bradycardic, beta blocker decreased, also felt to be dehydrated    Patient Active Problem List   Diagnosis Date Noted  . Pacemaker 07/16/2017  . Atrial fibrillation with slow ventricular response (HCC) 06/27/2017  . Hypercholesterolemia 06/27/2017  . Symptomatic bradycardia 06/27/2017  . Chronic pain of right knee 05/17/2016  . De Quervain's tenosynovitis 05/17/2016  . CAD (coronary artery disease) of artery bypass graft 02/13/2015  . Encounter for loop recorder check 02/01/2014  . Accelerated junctional rhythm 05/04/2013  . Dehydration 10/14/2012  . Syncope and collapse 10/13/2012  . History of first degree atrioventricular block 10/11/2012  . Long term (current) use of anticoagulants 10/06/2012  . AVB - beta blocker and Amiodarone stopped 07/10/2012  . Hypertension   . Hx of CABG X 4 3/06-    . Dyslipidemia   . RCA DES placed 7/12- patent 8/14   . Permanent atrial fibrillation   . Chronic kidney disease, stage 3 (HCC)     Past Surgical History:  Procedure Laterality Date  . CARDIAC CATHETERIZATION  782956   dr. Onalee Hua harding, revealing a atretic bypass to her right with patent distal RCA stent  .  CAROTID STENT  2012  . CORONARY ARTERY BYPASS GRAFT  2006   LIMA-LAD, free RIMA-PDA, Radial-OM1-OM2  . DOPPLER ECHOCARDIOGRAPHY  782956   mild asymmetric left ventricular hypertrophy, left ventricular systolic function is low normal, ejection fraction = 50-55%, the LA is moderate dilated, the RA is mildly dilated, no significant valvular disease  . INSERT / REPLACE / REMOVE PACEMAKER  07/16/2017  . JOINT REPLACEMENT     hip replacement  .  LEFT HEART CATHETERIZATION WITH CORONARY/GRAFT ANGIOGRAM N/A 10/12/2012   Procedure: LEFT HEART CATHETERIZATION WITH Isabel Caprice;  Surgeon: Marykay Lex, MD;  Location: [facility] CATH LAB;  Service: Cardiovascular;  Laterality: N/A;  . LOOP RECORDER IMPLANT N/A 10/15/2012   Procedure: LOOP RECORDER IMPLANT;  Surgeon: Thurmon Fair, MD;  Location: [facility] CATH LAB;  Service: Cardiovascular;  Laterality: N/A;  . LOOP RECORDER REMOVAL  07/16/2017  . LOOP RECORDER REMOVAL N/A 07/16/2017   Procedure: LOOP RECORDER REMOVAL;  Surgeon: Thurmon Fair, MD;  Location: [facility] INVASIVE CV LAB;  Service: Cardiovascular;  Laterality: N/A;  . NM MYOVIEW LTD  100510   post stress left ventricle is normal in size, post stress ejection fraction is 67% global left ventricular systolic function is normal, normal myocardial perfusion study, abnormal myocardial perfusion study, low risk scan  . PACEMAKER IMPLANT N/A 07/16/2017   Procedure: PACEMAKER IMPLANT;  Surgeon: Thurmon Fair, MD;  Location: [facility] INVASIVE CV LAB;  Service: Cardiovascular;  Laterality: N/A;  . THYROIDECTOMY       OB History   No obstetric history on file.      Home Medications    Prior to Admission medications   Medication Sig Start Date End Date Taking? Authorizing Provider  acetaminophen (TYLENOL) 500 MG tablet Take 500-1,000 mg by mouth every 6 (six) hours as needed for moderate pain.   Yes [provider]  amLODipine (NORVASC) 10 MG tablet Take 1 tablet (10 mg total) by mouth daily. 11/18/17 06/27/18 Yes Croitoru, Mihai, MD  atorvastatin (LIPITOR) 80 MG tablet Take 80 mg by mouth daily.   Yes [provider]  Calcium-Vit D-Arg-Inos-Silicon (BONE DENSITY) 300-200 MG-UNIT TABS Take 1 tablet by mouth daily.   Yes [provider]  cholecalciferol (VITAMIN D3) 25 MCG (1000 UT) tablet Take 1,000 Units by mouth daily.   Yes [provider]  clobetasol cream (TEMOVATE) 0.05 % Apply 1 application topically 3  (three) times daily as needed (to legs).  07/12/16  Yes [provider]  diphenhydrAMINE (BENADRYL) 25 MG tablet Take 25 mg by mouth every 6 (six) hours as needed for allergies.   Yes [provider]  ELIQUIS 5 MG TABS tablet TAKE 1 TABLET BY MOUTH TWICE A DAY Patient taking differently: Take 5 mg by mouth 2 (two) times daily.  02/03/18  Yes Runell Gess, MD  furosemide (LASIX) 40 MG tablet Take 1 tablet (40 mg total) by mouth daily. Patient taking differently: Take 40 mg by mouth daily as needed (chest pain).  11/09/14  Yes Barrett, Joline Salt, PA-C  hydroxypropyl methylcellulose / hypromellose (ISOPTO TEARS / GONIOVISC) 2.5 % ophthalmic solution Place 1 drop into both eyes 3 (three) times daily as needed for dry eyes.   Yes [provider]  levothyroxine (SYNTHROID, LEVOTHROID) 50 MCG tablet Take 50 mcg by mouth daily.   Yes [provider]  methocarbamol (ROBAXIN) 500 MG tablet Take 1 tablet (500 mg total) by mouth 2 (two) times daily. 07/10/17  Yes Palumbo, April, MD  nitroGLYCERIN (NITROSTAT) 0.4 MG  SL tablet Place 0.4 mg under the tongue every 5 (five) minutes as needed. For chest pain   Yes [provider]  traMADol (ULTRAM) 50 MG tablet Take 50 mg by mouth 2 (two) times daily.    Yes [provider]  valsartan (DIOVAN) 160 MG tablet Take 160 mg by mouth daily. 03/06/18  Yes [provider]  diclofenac sodium (VOLTAREN) 1 % GEL Apply 4 g topically 4 (four) times daily. Patient not taking: Reported on 06/27/2018 07/10/17   Palumbo, April, MD  meclizine (ANTIVERT) 25 MG tablet Take 1 tablet (25 mg total) by mouth 3 (three) times daily as needed for dizziness. Patient not taking: Reported on 06/27/2018 11/30/16   Vanetta Mulders, MD    Family History Family History  Problem Relation Age of Onset  . CAD Mother   . CAD Maternal Grandmother     Social History Social History   Tobacco Use  . Smoking status: Never Smoker  .  Smokeless tobacco: Current User    Types: Snuff  Substance Use Topics  . Alcohol use: No  . Drug use: No     Allergies   Penicillins and Sulfa antibiotics   Review of Systems Review of Systems  Constitutional: Negative.   HENT: Negative.   Respiratory: Negative.   Cardiovascular: Negative.   Gastrointestinal: Negative.   Genitourinary: Positive for dysuria, flank pain and frequency. Negative for decreased urine volume, difficulty urinating, hematuria, pelvic pain, urgency, vaginal bleeding and vaginal pain.  Musculoskeletal: Positive for back pain. Negative for gait problem, joint swelling, myalgias, neck pain and neck stiffness.  Skin: Negative.   Neurological: Negative for dizziness, seizures, syncope, facial asymmetry, speech difficulty, light-headedness, numbness and headaches.  All other systems reviewed and are negative.    Physical Exam Updated Vital Signs BP (!) 173/75   Pulse 73   Temp 98 F (36.7 C) (Oral)   Resp (!) 22   Ht 5\' 3"  (1.6 m)   Wt 67.6 kg   SpO2 98%   BMI 26.39 kg/m   Physical Exam Vitals signs and nursing note reviewed.  Constitutional:      General: She is not in acute distress.    Appearance: She is well-developed.  HENT:     Head: Atraumatic.  Eyes:     Pupils: Pupils are equal, round, and reactive to light.  Neck:     Musculoskeletal: Normal range of motion. No neck rigidity or muscular tenderness.     Comments: Full ROM without pain  Cardiovascular:     Rate and Rhythm: Normal rate.     Comments: 2+ radial, 2+ DP, PT pulses bilaterally. heart without murmurs, rubs or gallops Pulmonary:     Effort: No respiratory distress.     Comments: Clear to auscultation bilateral without wheeze, rhonchi or rales.  Speaks in full sentences without difficulty.  No assesory muscle usage. Abdominal:     General: There is no distension.     Comments: Soft, nontender without rebound or guarding.  No overlying skin changes to abdominal wall.  She  does have mild CVA tenderness to left flank.  No right CVA tenderness.  Normoactive bowel sounds.  Musculoskeletal: Normal range of motion.     Comments: Full range of motion of the T-spine and L-spine with flexion, hyperextension, and lateral flexion. No midline tenderness or stepoffs. No tenderness to palpation of the spinous processes of the T-spine or L-spine. Mild tenderness to palpation of the paraspinous muscles of the L-spine. Positive straight leg  raise on right at 30'. Spasm to bilateral lower back. No overlying skin changes.  Lymphadenopathy:     Cervical: No cervical adenopathy.  Skin:    General: Skin is warm.     Comments: Bilateral venous stasis skin changes to lower extremities.  Patient does have mild erythema without warmth or edema to her right lower extremity.  No rashes or lesions.  Brisk capillary refill.  Neurological:     General: No focal deficit present.     Mental Status: She is alert.     Cranial Nerves: Cranial nerves are intact.     Sensory: Sensation is intact.     Motor: Motor function is intact.     Deep Tendon Reflexes: Reflexes are normal and symmetric.     Reflex Scores:      Patellar reflexes are 2+ on the right side and 2+ on the left side.      Achilles reflexes are 2+ on the right side and 2+ on the left side.    Comments: No facial droop.  Cranial nerves II through XII grossly intact.  5/5 strength bilateral upper and lower extremities.  Phonation normal.  Intact sensation bilaterally.  Speech is clear and goal oriented, follows commands Sensation normal to light and sharp touch Moves extremities without ataxia, coordination intact Normal balance No Clonus    ED Treatments / Results  Labs (all labs ordered are listed, but only abnormal results are displayed) Labs Reviewed  COMPREHENSIVE METABOLIC PANEL - Abnormal; Notable for the following components:      Result Value   Calcium 11.0 (*)    GFR calc non Af Amer 49 (*)    GFR calc Af Amer 57  (*)    All other components within normal limits  URINALYSIS, ROUTINE W REFLEX MICROSCOPIC - Abnormal; Notable for the following components:   Color, Urine STRAW (*)    Hgb urine dipstick SMALL (*)    Leukocytes,Ua SMALL (*)    Bacteria, UA RARE (*)    All other components within normal limits  CULTURE, BLOOD (ROUTINE X 2)  CULTURE, BLOOD (ROUTINE X 2)  URINE CULTURE  CBC WITH DIFFERENTIAL/PLATELET  LACTIC ACID, PLASMA  LACTIC ACID, PLASMA    EKG EKG Interpretation  Date/Time:  Saturday Jun 27 2018 08:07:37 EDT Ventricular Rate:  73 PR Interval:    QRS Duration: 153 QT Interval:  434 QTC Calculation: 479 R Axis:   -85 Text Interpretation:  Sinus rhythm Short PR interval Left bundle branch block No significant change since last tracing Confirmed by Shaune PollackIsaacs, Cameron [phone]([id number]) on 06/27/2018 8:10:21 AM   Radiology No results found.  Procedures Procedures (including critical care time)  Medications Ordered in ED Medications  amLODipine (NORVASC) tablet 10 mg (has no administration in time range)  sodium chloride 0.9 % bolus 500 mL (0 mLs Intravenous Stopped 06/27/18 1008)  methocarbamol (ROBAXIN) tablet 500 mg (500 mg Oral Given 06/27/18 0935)  predniSONE (DELTASONE) tablet 60 mg (60 mg Oral Given 06/27/18 1027)  ketorolac (TORADOL) 30 MG/ML injection 30 mg (30 mg Intravenous Given 06/27/18 1027)  iohexol (OMNIPAQUE) 300 MG/ML solution 100 mL (100 mLs Intravenous Contrast Given 06/27/18 1142)   Initial Impression / Assessment and Plan / ED Course  I have reviewed the triage vital signs and the nursing notes.  Pertinent labs & imaging results that were available during my care of the patient were reviewed by me and considered in my medical decision making (see chart for details).  85 year old female patient  otherwise well presents for evaluation of back pain.  Afebrile, nonseptic, non-ill-appearing.  History of DDD and spinal stenosis.  Has had chronic back pain, however worse over the  last 12 hours.  Has had intermittent tingling which radiates down her bilateral legs, however R>L.  Also complains of muscle spasms in her lower back.  She has positive straight leg raise on right at 30 degrees.  Denies history of trauma or injury.  No overlying skin changes to back.Tenderness palpation to diffuse lumbar spine.  No tenderness to thoracic or cervical spine.  She neurovascularly intact.  Normal musculoskeletal exam, however back pain worse with lateral flexion of lumbar spine.  She is ambulatory without difficulty.  High suspicion for acute on chronic back pain.  Has also had urinary frequency and left flank pain.  Abdomen soft, nontender without rebound or guarding.  She has mild tenderness to her left flank, no tenderness to right flank.  Lungs clear.  Heart without murmurs, rubs or gallops.  EMS did report a temperature 101, however patient afebrile to 98 orally and rectally on arrival to ED any interventions.  Patient denies fever at home.  She has no known contacts with coronavirus positive patients or recent travel.  No cough or shortness of breath.  Will obtain labs, imaging given urinary symptoms and flank pain, pain management and reevaluate. Giving small bolus of IVF, pain medication, Last EF 55% on ECHO. No IV drug use, bowel or bladder incontinence, saddle paresthesia.  I have low suspicion for cauda equina, discitis, osteomyelitis, transverse myelitis, psoas abscess, cord compression.  1110: IV came out in CT prior to scan. Patient returned to the ED for new placement and then will go for CT. Pain managed at this time.  Lab and imaging personally reviewed: CBC: Without leukocytosis, Hgb 12.9 CMP: GFR 49, previous 38 No additional electrolyte, renal or liver abnormality Urinalysis: Small Leuks, rare bacteria, Will culture, Given symptomatic will dc home with Abx until culture returns. Lactic Acid: 1.4 CT AP: Pending CT lumbar: Pending EKG: Without ST/T changes. No STEMI.   1130: Care transferred to [facility] pending CT scans. Given medication allergies will give Cipro for UTI. Pain controlled on reevaluation.  Clinical Course as of Jun 26 1144  Sat Jun 27, 2018  1108 Has not taken home meds. Will order.  BP(!): 173/75 [BH]    Clinical Course User Index [BH] Annalycia Done A, PA-C     Patient was seen and evaluated by my attending, Dr. Erma Heritage who agrees with the treatment, plan and disposition  Stephanie Atkinson was evaluated in Emergency Department on 06/27/2018 for the symptoms described in the history of present illness. She was evaluated in the context of the global COVID-19 pandemic, which necessitated consideration that the patient might be at risk for infection with the SARS-CoV-2 virus that causes COVID-19. Institutional protocols and algorithms that pertain to the evaluation of patients at risk for COVID-19 are in a state of rapid change based on information released by regulatory bodies including the CDC and federal and state organizations. These policies and algorithms were followed during the patient's care in the ED. Final Clinical Impressions(s) / ED Diagnoses   Final diagnoses:  Back pain with radiculopathy  Acute cystitis with hematuria    ED Discharge Orders    None       Josel Keo A, PA-C 06/27/18 1147    Shaune Pollack, MD 06/28/18 [phone]

## 2018-06-27 NOTE — ED Provider Notes (Signed)
Care assumed from [facility], PA-C, at sign over of patient, please see their notes for full documentation of patient's complaint/HPI. Briefly, pt here with 1 day of acute on chronic back pain, followed by Dr. Ophelia Charter of [facility] for her back pain; also reports tingling in her legs R>L, and some urinary frequency and L flank pain for a few days. Results so far show lactic WNL, CBC WNL, CMP fairly unremarkable, U/A with small leuks 6-10 WBCs and rare bacteria. Awaiting CT abd/pelv and L spine (pt can't undergo MRI). Plan is to reassess after that, if negative then treating for UTI and possibly sending home with lidoderm patches for her acute on chronic back pain.   Physical Exam  BP (!) 173/75   Pulse 73   Temp 98 F (36.7 C) (Oral)   Resp (!) 22   Ht  (1.6 m)   Wt 67.6 kg   SpO2 98%   BMI 26.39 kg/m   Physical Exam Gen: afebrile, VSS, NAD HEENT: EOMI Resp: no resp distress CV: rate WNL Abd: appearance normal, nondistended, nonTTP, no r/g/r, very mild L CVA TTP MsK: moving all extremities with ease, mild diffuse lumbar paraspinous muscle TTP and slightly in the midline, no bony stepoffs or deformities, no overlying skin changes Neuro: A&O  ED Course/Procedures   Clinical Course as of Jun 26 1248  Sat Jun 27, 2018  1108 Has not taken home meds. Will order.  BP(!): 173/75 [BH]    Clinical Course User Index [BH] Henderly, Britni A, PA-C   Results for orders placed or performed during the hospital encounter of 06/27/18  CBC with Differential  Result Value Ref Range   WBC 5.4 4.0 - 10.5 K/uL   RBC 4.50 3.87 - 5.11 MIL/uL   Hemoglobin 12.9 12.0 - 15.0 g/dL   HCT 09.8 11.9 - 14.7 %   MCV 88.9 80.0 - 100.0 fL   MCH 28.7 26.0 - 34.0 pg   MCHC 32.3 30.0 - 36.0 g/dL   RDW 82.9 56.2 - 13.0 %   Platelets 197 150 - 400 K/uL   nRBC 0.0 0.0 - 0.2 %   Neutrophils Relative % 75 %   Neutro Abs 4.0 1.7 - 7.7 K/uL   Lymphocytes Relative 18 %   Lymphs Abs 1.0 0.7 - 4.0 K/uL   Monocytes Relative 7 %   Monocytes Absolute 0.4 0.1 - 1.0 K/uL   Eosinophils Relative 0 %   Eosinophils Absolute 0.0 0.0 - 0.5 K/uL   Basophils Relative 0 %   Basophils Absolute 0.0 0.0 - 0.1 K/uL   Immature Granulocytes 0 %   Abs Immature Granulocytes 0.01 0.00 - 0.07 K/uL  Comprehensive metabolic panel  Result Value Ref Range   Sodium 141 135 - 145 mmol/L   Potassium 3.7 3.5 - 5.1 mmol/L   Chloride 108 98 - 111 mmol/L   CO2 22 22 - 32 mmol/L   Glucose, Bld 93 70 - 99 mg/dL   BUN 15 8 - 23 mg/dL   Creatinine, Ser 8.65 0.44 - 1.00 mg/dL   Calcium 78.4 (H) 8.9 - 10.3 mg/dL   Total Protein 6.8 6.5 - 8.1 g/dL   Albumin 4.1 3.5 - 5.0 g/dL   AST 23 15 - 41 U/L   ALT 22 0 - 44 U/L   Alkaline Phosphatase 87 38 - 126 U/L   Total Bilirubin 0.9 0.3 - 1.2 mg/dL   GFR calc non Af Amer 49 (L) >60 mL/min   GFR  calc Af Amer 57 (L) >60 mL/min   Anion gap 11 5 - 15  Lactic acid, plasma  Result Value Ref Range   Lactic Acid, Venous 1.4 0.5 - 1.9 mmol/L  Lactic acid, plasma  Result Value Ref Range   Lactic Acid, Venous 1.2 0.5 - 1.9 mmol/L  Urinalysis, Routine w reflex microscopic  Result Value Ref Range   Color, Urine STRAW (A) YELLOW   APPearance CLEAR CLEAR   Specific Gravity, Urine 1.005 1.005 - 1.030   pH 7.0 5.0 - 8.0   Glucose, UA NEGATIVE NEGATIVE mg/dL   Hgb urine dipstick SMALL (A) NEGATIVE   Bilirubin Urine NEGATIVE NEGATIVE   Ketones, ur NEGATIVE NEGATIVE mg/dL   Protein, ur NEGATIVE NEGATIVE mg/dL   Nitrite NEGATIVE NEGATIVE   Leukocytes,Ua SMALL (A) NEGATIVE   RBC / HPF 0-5 0 - 5 RBC/hpf   WBC, UA 6-10 0 - 5 WBC/hpf   Bacteria, UA RARE (A) NONE SEEN   Squamous Epithelial / LPF 0-5 0 - 5   Ct Abdomen Pelvis W Contrast  Result Date: 06/27/2018 CLINICAL DATA:  Dysuria. Right-sided back pain radiating around the abdomen. EXAM: CT ABDOMEN AND PELVIS WITH CONTRAST TECHNIQUE: Multidetector CT imaging of the abdomen and pelvis was performed using the standard protocol following  bolus administration of intravenous contrast. CONTRAST:  100mL OMNIPAQUE IOHEXOL 300 MG/ML  SOLN COMPARISON:  10/13/2012 FINDINGS: Lower chest: Pacer lead is noted into the right ventricle. Partially covered changes of CABG. Hepatobiliary: Mild lobulation of the liver without caudate lobe enlargement or definite cirrhosis.Cholecystectomy with normal common bile duct diameter. Pancreas: Unremarkable. Spleen: Unremarkable. Adrenals/Urinary Tract: Negative adrenals. Bilateral renal cortical lobulation consistent with scarring. There are bilateral simple renal cysts. Stable morphology of the bladder with deeper positioning on the right. Stomach/Bowel:  No obstruction. No appendicitis. Vascular/Lymphatic: No acute vascular abnormality. Diffuse atherosclerotic calcification. The aorta is undulating without significant aneurysm. No mass or adenopathy. Reproductive:No pathologic findings. Other: No ascites or pneumoperitoneum. Musculoskeletal: Lumbar spine described on dedicated study. There is a total right hip arthroplasty. No acute osseous finding. IMPRESSION: No acute finding in the abdomen or pelvis. No noted change since 2014. Electronically Signed   By: Marnee SpringJonathon  Watts M.D.   On: 06/27/2018 12:17   Ct L-spine No Charge  Result Date: 06/27/2018 CLINICAL DATA:  Right-sided back pain radiating to the abdomen since last night EXAM: CT lumbar spine with contrast TECHNIQUE: Multiplanar CT images of the lumbar spine were reconstructed from contemporary CT of the abdomen. CONTRAST:  None additional COMPARISON:  None FINDINGS: Alignment: Borderline L4-5 anterolisthesis. Vertebrae: No acute fracture or focal pathologic process. Paraspinal and other soft tissues: Reported separately Disc levels: T12- L1: Mild facet spurring.  No impingement L1-L2: Mild endplate and facet spurring.  No impingement L2-L3: Asymmetric leftward endplate and facet spurring. There is mild left foraminal stenosis. Probable mild left subarticular  recess stenosis from disc. L3-L4: Disc bulge and degenerative facet spurring. Patent canal and foramina L4-L5: Disc bulge versus broad herniation. There is degenerative facet spurring with right more than left foraminal impingement. Likely high-grade spinal stenosis. L5-S1:Degenerative facet spurring. Disc bulging and endplate spurring. No impingement noted. IMPRESSION: 1. No acute finding. 2. Generalized spinal degeneration most notable at L4-5 where there is likely high-grade spinal stenosis and right more than left foraminal impingement. Electronically Signed   By: Marnee SpringJonathon  Watts M.D.   On: 06/27/2018 12:13     Meds ordered this encounter  Medications  . sodium chloride 0.9 % bolus 500  mL  . methocarbamol (ROBAXIN) tablet 500 mg  . predniSONE (DELTASONE) tablet 60 mg  . ketorolac (TORADOL) 30 MG/ML injection 30 mg  . iohexol (OMNIPAQUE) 300 MG/ML solution 100 mL  . amLODipine (NORVASC) tablet 10 mg  . ciprofloxacin (CIPRO) 500 MG tablet    Sig: Take 1 tablet (500 mg total) by mouth 2 (two) times daily. One po bid x 7 days    Dispense:  14 tablet    Refill:  0    Order Specific Question:   Supervising Provider    Answer:   Hyacinth Meeker, BRIAN [[id number]]  . lidocaine (LIDODERM) 5 %    Sig: Place 1 patch onto the skin daily. Remove & Discard patch within 12 hours or as directed by MD    Dispense:  30 patch    Refill:  0    Order Specific Question:   Supervising Provider    Answer:   Eber Hong [[id number]]     MDM:   ICD-10-CM   1. Acute cystitis with hematuria N30.01   2. Back pain with radiculopathy M54.10 CT L-SPINE NO CHARGE    CT L-SPINE NO CHARGE  3. Chronic bilateral low back pain, unspecified whether sciatica present M54.5    G89.29    1:01 PM CT abd/pelv without acute findings. CT L spine with no acute findings, generalized spinal degeneration most notable at L4-5 where there is likely high grade spinal stenosis and R>L foraminal impingement. Pt feeling better, gait steady without  assistance. Will d/c home with cipro for UTI (pt allergic to PCNs and sulfa, unclear if able to tolerate cephalosporins, and want to cover kidney so cipro is best option). Will also send home with lidoderm patches. Advised use of home pain meds and muscle relaxants, heat use, and f/up with her orthopedist and her PCP in 1wk for recheck. I explained the diagnosis and have given explicit precautions to return to the ER including for any other new or worsening symptoms. The patient understands and accepts the medical plan as it's been dictated and I have answered their questions. Discharge instructions concerning home care and prescriptions have been given. The patient is STABLE and is discharged to home in good condition.      [address] [place], [place] 06/27/18 1302    Shaune Pollack, MD 06/28/18 [phone]

## 2018-06-27 NOTE — ED Notes (Signed)
Patient verbalizes understanding of discharge instructions. Opportunity for questioning and answers were provided. Pt discharged from ED. 

## 2018-06-27 NOTE — Discharge Instructions (Signed)
Continue taking your home pain medications and muscle relaxants. Use lidoderm patches as directed as needed for additional pain relief. Use heat to your back to help with pain, no more than 20 minutes per hour. Stay well hydrated. You have a urinary tract infection. Take antibiotics as directed until completed. Follow up with your regular doctor and your orthopedist in the next 1 week for recheck of symptoms. Return to the ER for emergent changes or worsening symptoms.

## 2018-06-27 NOTE — ED Notes (Signed)
PIV was replaced due to CT reporting it infiltrating while she was in the process of beginning the CT. CT has been called to try again with a new IV in her upper Rt arm, 20g.

## 2018-06-28 LAB — BLOOD CULTURE ID PANEL (REFLEXED)

## 2018-06-28 LAB — URINE CULTURE

## 2018-06-28 NOTE — ED Provider Notes (Signed)
Blood cx 1/2 bottles positive for GPR and coag neg staph. No fever, no WBC, normal lactic acid, only one cx bottle positive and suspect this is likely contaminant.    Alvira Monday, MD 06/28/18 1650

## 2018-06-30 LAB — CULTURE, BLOOD (ROUTINE X 2): Special Requests: ADEQUATE

## 2018-07-01 ENCOUNTER — Telehealth: Payer: Self-pay | Admitting: Emergency Medicine

## 2018-07-01 NOTE — Telephone Encounter (Signed)
Post ED Visit - Positive Culture Follow-up  Culture report reviewed by antimicrobial stewardship pharmacist: [facility] []  Enzo Bi, Pharm.D. []  Celedonio Miyamoto, Pharm.D., BCPS AQ-ID []  Garvin Fila, Pharm.D., BCPS []  Georgina Pillion, [address].D., BCPS []  [place], [address].D., BCPS, AAHIVP []  Estella Husk, Pharm.D., BCPS, AAHIVP []  Lysle Pearl, PharmD, BCPS []  Phillips Climes, PharmD, BCPS []  Agapito Games, PharmD, BCPS []  Verlan Friends, PharmD []  Mervyn Gay, PharmD, BCPS []  Vinnie Level, PharmD Lenord Carbo PharmD  [facility] []  Len Childs, PharmD []  Greer Pickerel, PharmD []  Adalberto Cole, PharmD []  Perlie Gold, Rph []  Lonell Face) Jean Rosenthal, PharmD []  Earl Many, PharmD []  Junita Push, PharmD []  Dorna Leitz, PharmD []  Terrilee Files, PharmD []  Lynann Beaver, PharmD []  Keturah Barre, PharmD []  Loralee Pacas, PharmD []  Bernadene Person, PharmD   Positive blood culture Contaminate,  no further patient follow-up is required at this time.  Berle Mull 07/01/2018, 9:36 AM

## 2018-07-01 NOTE — Telephone Encounter (Signed)
Post ED Visit - Positive Culture Follow-up  Culture report reviewed by antimicrobial stewardship pharmacist: [facility] []  Enzo Bi, Pharm.D. []  Celedonio Miyamoto, Pharm.D., BCPS AQ-ID []  Garvin Fila, Pharm.D., BCPS []  Georgina Pillion, Pharm.D., BCPS []  [place]ellie, [address].D., BCPS, AAHIVP []  Estella Husk, Pharm.D., BCPS, AAHIVP []  Lysle Pearl, PharmD, BCPS []  Phillips Climes, PharmD, BCPS []  Agapito Games, PharmD, BCPS []  Verlan Friends, PharmD []  Mervyn Gay, PharmD, BCPS []  Vinnie Level, PharmD Lenord Carbo PharmD  [facility] []  Len Childs, PharmD []  Greer Pickerel, PharmD []  Adalberto Cole, PharmD []  Perlie Gold, Rph []  Lonell Face) Jean Rosenthal, PharmD []  Earl Many, PharmD []  Junita Push, PharmD []  Dorna Leitz, PharmD []  Terrilee Files, PharmD []  Lynann Beaver, PharmD []  Keturah Barre, PharmD []  Loralee Pacas, PharmD []  Bernadene Person, PharmD   Positive blood culture Contaminate, no further patient follow-up is required at this time.  Berle Mull 07/01/2018, 9:39 AM

## 2018-07-01 NOTE — Progress Notes (Signed)
ED Antimicrobial Stewardship Positive Culture Follow Up   Stephanie Atkinson is an 85 y.o. female who presented to [facility] on 06/27/2018 with a chief complaint of back pain spasms. Blood cultures from 5/2 grew diphtheroids and staph spp. in 1/2 bottles, most likely a contaminate, no treatment necessary.   Chief Complaint  Patient presents with  . Back Pain    Recent Results (from the past 720 hour(s))  Blood culture (routine x 2)     Status: None (Preliminary result)   Collection Time: 06/27/18  8:23 AM  Result Value Ref Range Status   Specimen Description BLOOD RIGHT ANTECUBITAL  Final   Special Requests   Final    BOTTLES DRAWN AEROBIC AND ANAEROBIC Blood Culture adequate volume   Culture   Final    NO GROWTH 4 DAYS Performed at [facility], [address], [place], [place] [address]    Report Status PENDING  Incomplete  Urine culture     Status: None   Collection Time: 06/27/18  9:05 AM  Result Value Ref Range Status   Specimen Description URINE, RANDOM  Final   Special Requests   Final    NONE Performed at [facility], [address] [place], [place] [address]    Culture   Final    Multiple bacterial morphotypes present, none predominant. Suggest appropriate recollection if clinically indicated.   Report Status 06/28/2018 FINAL  Final  Blood culture (routine x 2)     Status: Abnormal   Collection Time: 06/27/18  9:15 AM  Result Value Ref Range Status   Specimen Description BLOOD RIGHT ARM  Final   Special Requests   Final    BOTTLES DRAWN AEROBIC AND ANAEROBIC Blood Culture adequate volume   Culture  Setup Time   Final    GRAM POSITIVE COCCI ANAEROBIC BOTTLE ONLY Organism ID to follow CRITICAL RESULT CALLED TO, READ BACK BY AND VERIFIED WITH: PHRMD J ORIET @0551  06/28/18 BY S GEZAHEGN GRAM POSITIVE RODS AEROBIC BOTTLE ONLY CRITICAL RESULT CALLED TO, READ BACK BY AND VERIFIED WITH: RN CINDY REED [id number] FCP    Culture (A)  Final    STAPHYLOCOCCUS  SPECIES (COAGULASE NEGATIVE) THE SIGNIFICANCE OF ISOLATING THIS ORGANISM FROM A SINGLE SET OF BLOOD CULTURES WHEN MULTIPLE SETS ARE DRAWN IS UNCERTAIN. PLEASE NOTIFY THE MICROBIOLOGY DEPARTMENT WITHIN ONE WEEK IF SPECIATION AND SENSITIVITIES ARE REQUIRED. DIPHTHEROIDS(CORYNEBACTERIUM SPECIES) Standardized susceptibility testing for this organism is not available. Performed at [facility], [address], [place], [place] [address]    Report Status 06/30/2018 FINAL  Final  Blood Culture ID Panel (Reflexed)     Status: Abnormal   Collection Time: 06/27/18  9:15 AM  Result Value Ref Range Status   Enterococcus species NOT DETECTED NOT DETECTED Final   Listeria monocytogenes NOT DETECTED NOT DETECTED Final   Staphylococcus species DETECTED (A) NOT DETECTED Final    Comment: Methicillin (oxacillin) resistant coagulase negative staphylococcus. Possible blood culture contaminant (unless isolated from more than one blood culture draw or clinical case suggests pathogenicity). No antibiotic treatment is indicated for blood  culture contaminants. CRITICAL RESULT CALLED TO, READ BACK BY AND VERIFIED WITH: PHRMD J ORIET @0551  06/28/18 BY S GEZAHEGN    Staphylococcus aureus (BCID) NOT DETECTED NOT DETECTED Final   Methicillin resistance DETECTED (A) NOT DETECTED Final    Comment: CRITICAL RESULT CALLED TO, READ BACK BY AND VERIFIED WITH: PHRMD J ORIET @0551  06/28/18 BY S GEZAHEGN  Streptococcus species NOT DETECTED NOT DETECTED Final   Streptococcus agalactiae NOT DETECTED NOT DETECTED Final   Streptococcus pneumoniae NOT DETECTED NOT DETECTED Final   Streptococcus pyogenes NOT DETECTED NOT DETECTED Final   Acinetobacter baumannii NOT DETECTED NOT DETECTED Final   Enterobacteriaceae species NOT DETECTED NOT DETECTED Final   Enterobacter cloacae complex NOT DETECTED NOT DETECTED Final   Escherichia coli NOT DETECTED NOT DETECTED Final   Klebsiella oxytoca NOT DETECTED NOT DETECTED Final    Klebsiella pneumoniae NOT DETECTED NOT DETECTED Final   Proteus species NOT DETECTED NOT DETECTED Final   Serratia marcescens NOT DETECTED NOT DETECTED Final   Haemophilus influenzae NOT DETECTED NOT DETECTED Final   Neisseria meningitidis NOT DETECTED NOT DETECTED Final   Pseudomonas aeruginosa NOT DETECTED NOT DETECTED Final   Candida albicans NOT DETECTED NOT DETECTED Final   Candida glabrata NOT DETECTED NOT DETECTED Final   Candida krusei NOT DETECTED NOT DETECTED Final   Candida parapsilosis NOT DETECTED NOT DETECTED Final   Candida tropicalis NOT DETECTED NOT DETECTED Final    Comment: Performed at [facility], [address] [place], [place] [address]    ED Provider: Jeralyn BennettJeffery Hedges, PA-C  Thank you for allowing pharmacy to be a part of this patient's care.  Lenord Carboebecca Lash Matulich, PharmD PGY1 Pharmacy Resident Phone: [phone]  Please check AMION for all [facility] phone numbers 07/01/2018, 9:46 AM

## 2018-07-02 LAB — CULTURE, BLOOD (ROUTINE X 2)
Culture: NO GROWTH
Special Requests: ADEQUATE

## 2018-07-17 ENCOUNTER — Ambulatory Visit (INDEPENDENT_AMBULATORY_CARE_PROVIDER_SITE_OTHER): Payer: Medicare Other | Admitting: *Deleted

## 2018-07-17 DIAGNOSIS — R001 Bradycardia, unspecified: Secondary | ICD-10-CM

## 2018-07-18 LAB — CUP PACEART REMOTE DEVICE CHECK
Battery Remaining Longevity: 138 mo
Battery Voltage: 3.04 V
Brady Statistic RV Percent Paced: 98.08 %
Date Time Interrogation Session: 20200522044218
Implantable Lead Implant Date: 20190522
Implantable Lead Location: 753860
Implantable Lead Model: 5076
Implantable Pulse Generator Implant Date: 20190522
Lead Channel Impedance Value: 342 Ohm
Lead Channel Impedance Value: 532 Ohm
Lead Channel Pacing Threshold Amplitude: 0.625 V
Lead Channel Pacing Threshold Pulse Width: 0.4 ms
Lead Channel Sensing Intrinsic Amplitude: 9.25 mV
Lead Channel Sensing Intrinsic Amplitude: 9.25 mV
Lead Channel Setting Pacing Amplitude: 2.5 V
Lead Channel Setting Pacing Pulse Width: 0.4 ms
Lead Channel Setting Sensing Sensitivity: 1.2 mV

## 2018-07-27 ENCOUNTER — Encounter: Payer: Self-pay | Admitting: Cardiology

## 2018-07-27 NOTE — Progress Notes (Signed)
Remote pacemaker transmission.   

## 2018-08-11 ENCOUNTER — Other Ambulatory Visit: Payer: Self-pay | Admitting: Cardiovascular Disease

## 2018-08-12 NOTE — Telephone Encounter (Signed)
DX; AFib

## 2018-08-12 NOTE — Telephone Encounter (Signed)
Pt is a 89yof requesting eliquis 5mg , wt of 69.4kg, scr of 1.25 (06/27/18), lov w/Dr. Gwenlyn Found (06/10/18 Telemed). I would refill for 3 mos  Diagnoses  Atrial fibrillation with slow ventricular response (HCC)   Codes: I48.91  AVB (atrioventricular block)   Codes: I44.30  Coronary artery disease involving coronary bypass graft of native heart without angina pectoris   Codes: I25.810  Dyslipidemia   Codes: E78.5  Hx of CABG   Codes: Z95.1  Hypercholesterolemia   Codes: E78.00  Essential hypertension   Codes: I10  Permanent atrial fibrillation   Codes: I48.21  Pacemaker   Codes: Z95.0  Presence of stent in right coronary artery   Codes: Z95.5  Symptomatic bradycardia   Codes: R00.1   Diagnoses  Atrial fibrillation with slow ventricular response (HCC)   Codes: I48.91  AVB (atrioventricular block)   Codes: I44.30  Coronary artery disease involving coronary bypass graft of native heart without angina pectoris   Codes: I25.810  Dyslipidemia   Codes: E78.5  Hx of CABG   Codes: Z95.1  Hypercholesterolemia   Codes: E78.00  Essential hypertension   Codes: I10  Permanent atrial fibrillation   Codes: I48.21  Pacemaker   Codes: Z95.0  Presence of stent in right coronary artery   Codes: Z95.5  Symptomatic bradycardia   Codes: R00.1

## 2018-08-12 NOTE — Telephone Encounter (Signed)
SCr 0.99 on 06/27/2018

## 2018-09-11 ENCOUNTER — Telehealth: Payer: Self-pay | Admitting: *Deleted

## 2018-09-11 NOTE — Telephone Encounter (Signed)
Left a message for the patient's daughter to call back about changing the patient's appointment to a virtual one for Monday 7/20. Consent will be needed.

## 2018-09-14 ENCOUNTER — Encounter: Payer: Self-pay | Admitting: Cardiovascular Disease

## 2018-09-14 ENCOUNTER — Encounter: Payer: Medicare Other | Admitting: Cardiovascular Disease

## 2018-09-14 ENCOUNTER — Ambulatory Visit (INDEPENDENT_AMBULATORY_CARE_PROVIDER_SITE_OTHER): Payer: Medicare Other | Admitting: Cardiovascular Disease

## 2018-09-14 ENCOUNTER — Other Ambulatory Visit: Payer: Self-pay

## 2018-09-14 VITALS — BP 170/82 | HR 88 | Ht 63.0 in | Wt 162.0 lb

## 2018-09-14 DIAGNOSIS — I25810 Atherosclerosis of coronary artery bypass graft(s) without angina pectoris: Secondary | ICD-10-CM

## 2018-09-14 DIAGNOSIS — I10 Essential (primary) hypertension: Secondary | ICD-10-CM

## 2018-09-14 DIAGNOSIS — I5032 Chronic diastolic (congestive) heart failure: Secondary | ICD-10-CM | POA: Diagnosis not present

## 2018-09-14 DIAGNOSIS — Z950 Presence of cardiac pacemaker: Secondary | ICD-10-CM | POA: Diagnosis not present

## 2018-09-14 DIAGNOSIS — Z7901 Long term (current) use of anticoagulants: Secondary | ICD-10-CM

## 2018-09-14 DIAGNOSIS — I4821 Permanent atrial fibrillation: Secondary | ICD-10-CM

## 2018-09-14 MED ORDER — SPIRONOLACTONE 25 MG PO TABS: 25.0000 mg | ORAL_TABLET | Freq: Every day | ORAL | 3 refills | Status: DC

## 2018-09-14 NOTE — Progress Notes (Signed)
Patient ID: Stephanie Atkinson, female   DOB: 1924-06-24, 85 y.o.   MRN: [id number]    Cardiology Office Note    Date:  09/14/2018   ID:  Dietrich PatesCora R Atkinson, DOB 1924-06-24, MRN [id number]  PCP:  Stephanie GrippeKim, James, MD  Cardiologist:  Nanetta BattyJonathan Berry, MD  Thurmon FairMihai Luiscarlos Kaczmarczyk, MD   Chief Complaint  Patient presents with  . Leg Swelling    History of Present Illness:  Stephanie AspCora R Samonte is a 85 y.o. female with CAD s/p CABG, permanent AFib with slow ventricular response, remote history of syncope and single-chamber permanent pacemaker (Medtronic Azure XT SR MRI, Jul 16, 2017).   She presents now with complaints of lower extremity edema and weight gain, o.  Ver the last few weeks.  Her typical weight is around 149 pounds and today she weighs 13 pounds more.  Her blood pressure is higher than usual.  She has roughly 1-2+ lower extremity edema..  She has NYHA functional class II exertional dyspnea.  She reports being extremely careful not to eat sodium rich foods and she cooks all her own meals.  Denies any problems with chest pain either at rest or with activity and does not have orthopnea or PND.  She has not had falls, injuries or bleeding problems.  She does not have intermittent claudication or focal neurological complaints.  Pacemaker interrogation shows normal device function.  Battery longevity 11.3 years.  She has had 98% ventricular pacing with adequate ventricular heart rate histograms.  Activity level is fairly constant at roughly 2.4 hours/day.  There have been no episodes of high ventricular rate.  Presenting rhythm was 100% ventricular paced.  Her blood pressure is consistently elevated on foods even higher than usual.  It was 170/82 on presentation and remained high at 160/65, when rechecked clinically minutes later.  Past Medical History:  Diagnosis Date  . Chronic kidney disease, stage 3 (HCC)   . Coronary artery disease   . Hx of CABG 2006   LIMA-LAD, free RIMA-PDA, Radial-OM1-OM2  . Hyperlipidemia    . Hypertension   . Hypothyroid   . MI, old       . PAF (paroxysmal atrial fibrillation) (HCC)    was on amio, d/c'd due to prolonged PR interval, rate control  . Presence of permanent cardiac pacemaker 07/16/2017  . Presence of stent in right coronary artery 07/12   RIMA-PDA occluded, DES stent placed  . Syncope 07/12   bradycardic, beta blocker decreased, also felt to be dehydrated    Past Surgical History:  Procedure Laterality Date  . CARDIAC CATHETERIZATION  [id number]   dr. Onalee Huadavid harding, revealing a atretic bypass to her right with patent distal RCA stent  . CAROTID STENT  2012  . CORONARY ARTERY BYPASS GRAFT  2006   LIMA-LAD, free RIMA-PDA, Radial-OM1-OM2  . DOPPLER ECHOCARDIOGRAPHY  [id number]   mild asymmetric left ventricular hypertrophy, left ventricular systolic function is low normal, ejection fraction = 50-55%, the LA is moderate dilated, the RA is mildly dilated, no significant valvular disease  . INSERT / REPLACE / REMOVE PACEMAKER  07/16/2017  . JOINT REPLACEMENT     hip replacement  . LEFT HEART CATHETERIZATION WITH CORONARY/GRAFT ANGIOGRAM N/A 10/12/2012   Procedure: LEFT HEART CATHETERIZATION WITH Isabel CapriceORONARY/GRAFT ANGIOGRAM;  Surgeon: Marykay Lexavid W Harding, MD;  [facility];  Service: Cardiovascular;  Laterality: N/A;  . LOOP RECORDER IMPLANT N/A 10/15/2012   Procedure: LOOP RECORDER IMPLANT;  Surgeon: Thurmon FairMihai Emrys Mceachron, MD;  Location: [facility] CATH LAB;  Service:  Cardiovascular;  Laterality: N/A;  . LOOP RECORDER REMOVAL  07/16/2017  . LOOP RECORDER REMOVAL N/A 07/16/2017   Procedure: LOOP RECORDER REMOVAL;  Surgeon: Thurmon Fairroitoru, Eldean Nanna, MD;  Location: [facility] INVASIVE CV LAB;  Service: Cardiovascular;  Laterality: N/A;  . NM MYOVIEW LTD  100510   post stress left ventricle is normal in size, post stress ejection fraction is 67% global left ventricular systolic function is normal, normal myocardial perfusion study, abnormal myocardial perfusion study, low risk scan  . PACEMAKER IMPLANT  N/A 07/16/2017   Procedure: PACEMAKER IMPLANT;  Surgeon: Thurmon Fairroitoru, Gavan Nordby, MD;  Location: [facility] INVASIVE CV LAB;  Service: Cardiovascular;  Laterality: N/A;  . THYROIDECTOMY      Current Outpatient Medications  Medication Sig Dispense Refill  . acetaminophen (TYLENOL) 500 MG tablet Take 500-1,000 mg by mouth every 6 (six) hours as needed for moderate pain.    [name]. amLODipine (NORVASC) 10 MG tablet Take 1 tablet (10 mg total) by mouth daily. 90 tablet 1  . atorvastatin (LIPITOR) 80 MG tablet Take 80 mg by mouth daily.    . Calcium-Vit D-Arg-Inos-Silicon (BONE DENSITY) 300-200 MG-UNIT TABS Take 1 tablet by mouth daily.    . cholecalciferol (VITAMIN D3) 25 MCG (1000 UT) tablet Take 1,000 Units by mouth daily.    . clobetasol cream (TEMOVATE) 0.05 % Apply 1 application topically 3 (three) times daily as needed (to legs).   3  . diphenhydrAMINE (BENADRYL) 25 MG tablet Take 25 mg by mouth every 6 (six) hours as needed for allergies.    [name]. ELIQUIS 5 MG TABS tablet TAKE 1 TABLET BY MOUTH TWICE A DAY 180 tablet 1  . furosemide (LASIX) 40 MG tablet Take 1 tablet (40 mg total) by mouth daily. (Patient taking differently: Take 40 mg by mouth daily as needed (chest pain). ) 90 tablet 3  . hydroxypropyl methylcellulose / hypromellose (ISOPTO TEARS / GONIOVISC) 2.5 % ophthalmic solution Place 1 drop into both eyes 3 (three) times daily as needed for dry eyes.    [name]. levothyroxine (SYNTHROID, LEVOTHROID) 50 MCG tablet Take 50 mcg by mouth daily.    [name]. lidocaine (LIDODERM) 5 % Place 1 patch onto the skin daily. Remove & Discard patch within 12 hours or as directed by MD 30 patch 0  . methocarbamol (ROBAXIN) 500 MG tablet Take 1 tablet (500 mg total) by mouth 2 (two) times daily. 14 tablet 0  . nitroGLYCERIN (NITROSTAT) 0.4 MG SL tablet Place 0.4 mg under the tongue every 5 (five) minutes as needed. For chest pain    . traMADol (ULTRAM) 50 MG tablet Take 50 mg by mouth 2 (two) times daily.     . valsartan (DIOVAN) 160 MG  tablet Take 160 mg by mouth daily.    [name]. spironolactone (ALDACTONE) 25 MG tablet Take 1 tablet (25 mg total) by mouth daily. 90 tablet 3   No current facility-administered medications for this visit.     Allergies:   Penicillins and Sulfa antibiotics   Social History   Socioeconomic History  . Marital status: Single    Spouse name: Not on file  . Number of children: Not on file  . Years of education: Not on file  . Highest education level: Not on file  Occupational History  . Not on file  Social Needs  . Financial resource strain: Not on file  . Food insecurity    Worry: Not on file    Inability: Not on file  . Transportation needs    Medical:  Not on file    Non-medical: Not on file  Tobacco Use  . Smoking status: Never Smoker  . Smokeless tobacco: Current User    Types: Snuff  Substance and Sexual Activity  . Alcohol use: No  . Drug use: No  . Sexual activity: Not on file  Lifestyle  . Physical activity    Days per week: Not on file    Minutes per session: Not on file  . Stress: Not on file  Relationships  . Social Musicianconnections    Talks on phone: Not on file    Gets together: Not on file    Attends religious service: Not on file    Active member of club or organization: Not on file    Attends meetings of clubs or organizations: Not on file    Relationship status: Not on file  Other Topics Concern  . Not on file  Social History Narrative  . Not on file     Family History:  The patient's family history includes CAD in her maternal grandmother and mother.   ROS:   Please see the history of present illness.    Review of Systems  All other systems reviewed and are negative. All other systems are reviewed and are negative  PHYSICAL EXAM:   VS:  BP (!) 170/82   Pulse 88   Ht 5\' 3"  (1.6 m)   Wt 162 lb (73.5 kg)   SpO2 96%   BMI 28.70 kg/m       General: Alert, oriented x3, no distress, appears lean and fit and younger than stated age. Head: no evidence  of trauma, PERRL, EOMI, no exophtalmos or lid lag, no myxedema, no xanthelasma; normal ears, nose and oropharynx Neck: Elevation 6-7 cm jugular venous pulsations and no hepatojugular reflux; brisk carotid pulses without delay and no carotid bruits Chest: clear to auscultation, no signs of consolidation by percussion or palpation, normal fremitus, symmetrical and full respiratory excursions; left subclavian pacemaker site well-healed Cardiovascular: normal position and quality of the apical impulse, regular rhythm, normal first and paradoxically split second heart sounds, no murmurs, rubs or gallops Abdomen: no tenderness or distention, no masses by palpation, no abnormal pulsatility or arterial bruits, normal bowel sounds, no hepatosplenomegaly Extremities: no clubbing, cyanosis; 1-2+ symmetrical ankle edema; 2+ radial, ulnar and brachial pulses bilaterally; 2+ right femoral, posterior tibial and dorsalis pedis pulses; 2+ left femoral, posterior tibial and dorsalis pedis pulses; no subclavian or femoral bruits Neurological: grossly nonfocal Psych: Normal mood and affect   Wt Readings from Last 3 Encounters:  09/14/18 162 lb (73.5 kg)  06/27/18 149 lb (67.6 kg)  06/10/18 149 lb (67.6 kg)      Studies/Labs Reviewed:   EKG:  EKG is not ordered today.  The intracardiac electrogram shows 100% ventricular pacing at presentation  Recent Labs: 06/27/2018: ALT 22; BUN 15; Creatinine, Ser 0.99; Hemoglobin 12.9; Platelets 197; Potassium 3.7; Sodium 141   Lipid Panel    Component Value Date/Time   CHOL 127 11/25/2016 1005   TRIG 117 11/25/2016 1005   HDL 46 11/25/2016 1005   CHOLHDL 2.8 11/25/2016 1005   CHOLHDL 2.6 01/05/2015 1118   VLDL 18 01/05/2015 1118   LDLCALC 58 11/25/2016 1005     ASSESSMENT:    1. Permanent atrial fibrillation   2. Essential hypertension   3. Chronic diastolic heart failure (HCC)   4. Pacemaker   5. Long term (current) use of anticoagulants   6. Coronary  artery disease involving  coronary bypass graft of native heart without angina pectoris      PLAN:  In order of problems listed above: 1.   CHF: Seems to be having some signs and symptoms of congestive heart failure and this may be related to the presence of ventricular pacing.   We will increase the dose of furosemide to 80 mg daily and add spironolactone to help with blood pressure and potassium levels.  Congratulated on sodium restriction asked he continue this.  Encouraged daily weight monitoring.  If EF drops, will have to consider CRT 2. Pacemaker: Normal device function.  Remote downloads every 3 months and yearly office visits.  Has a very high burden of ventricular pacing and if heart rate cannot be controlled otherwise may need upgrade to CRT. 3. Atrial fibrillation permanent, with slow ventricular response. 4. Eliquis: Well-tolerated no bleeding complications. 5. HBP: Not well controlled.  On maximum dose amlodipine as well as valsartan.  Will increase diuretic dose.  We could add beta-blocker now that she has a pacemaker. 6. CAD: she does not have angina pectoris. > 6 years since last revascularization procedure, about 4 years ago cath was OK 7. HLP: LDL cholesterol close to target on statin.   Medication Adjustments/Labs and Tests Ordered: Current medicines are reviewed at length with the patient today.  Concerns regarding medicines are outlined above.  Medication changes, Labs and Tests ordered today are listed below. Patient Instructions  Medication Instructions:  START Spironolactone 25 mg one daily TAKE 80 mg (2 tablets) of the Furosemide daily until your weight gets to 150 pounds, then go back to the 40 mg once daily  If you need a refill on your cardiac medications before your next appointment, please call your pharmacy.   Lab work: Your provider would like for you to return in 2-3 weeks to have the following labs drawn: BMET. You do not need an appointment for the lab.  Once in our office lobby there is a podium where you can sign in and ring the doorbell to alert [place] that you are here. The lab is open from 8:00 am to 4:30 pm; closed for lunch from 12:45pm-1:45pm.  If you have labs (blood work) drawn today and your tests are completely normal, you will receive your results only by: [name] MyChart Message (if you have MyChart) OR . A paper copy in the mail If you have any lab test that is abnormal or we need to change your treatment, we will call you to review the results.  Testing/Procedures: None ordered  Follow-Up: Follow up in one month for a virtual visit with an APP     Signed, Sanda Klein, MD  09/14/2018 10:28 PM    [address], [place], [place]  [address] Phone: [phone]; Fax: [phone]

## 2018-09-14 NOTE — Patient Instructions (Addendum)
Medication Instructions:  START Spironolactone 25 mg one daily TAKE 80 mg (2 tablets) of the Furosemide daily until your weight gets to 150 pounds, then go back to the 40 mg once daily  If you need a refill on your cardiac medications before your next appointment, please call your pharmacy.   Lab work: Your provider would like for you to return in 2-3 weeks to have the following labs drawn: BMET. You do not need an appointment for the lab. Once in our office lobby there is a podium where you can sign in and ring the doorbell to alert [place] that you are here. The lab is open from 8:00 am to 4:30 pm; closed for lunch from 12:45pm-1:45pm.  If you have labs (blood work) drawn today and your tests are completely normal, you will receive your results only by: [name] MyChart Message (if you have MyChart) OR . A paper copy in the mail If you have any lab test that is abnormal or we need to change your treatment, we will call you to review the results.  Testing/Procedures: None ordered  Follow-Up: Follow up in one month for a virtual visit with an APP

## 2018-09-16 ENCOUNTER — Telehealth: Payer: Self-pay

## 2018-09-16 ENCOUNTER — Encounter: Payer: Medicare Other | Admitting: Cardiovascular Disease

## 2018-09-16 MED ORDER — FUROSEMIDE 40 MG PO TABS: ORAL_TABLET | ORAL | 3 refills | Status: DC

## 2018-09-16 NOTE — Telephone Encounter (Signed)
Received a call from patient's daughter Bethena Roys.Stated mother saw Dr.Croitoru yesterday and he started her on Furosemide.Stated she needs a refill.Advised to take 40 mg 2 tablets ( 80 mg ) daily until her weight reaches 150 lbs then take 1 tablet ( 40 mg ) daily.Refill sent to pharmacy.

## 2018-09-24 LAB — BASIC METABOLIC PANEL
BUN/Creatinine Ratio: 19 (ref 12–28)
BUN: 21 mg/dL (ref 10–36)
CO2: 22 mmol/L (ref 20–29)
Calcium: 11.2 mg/dL — ABNORMAL HIGH (ref 8.7–10.3)
Chloride: 104 mmol/L (ref 96–106)
Creatinine, Ser: 1.13 mg/dL — ABNORMAL HIGH (ref 0.57–1.00)
GFR calc Af Amer: 49 mL/min/{1.73_m2} — ABNORMAL LOW (ref >59)
GFR calc non Af Amer: 42 mL/min/{1.73_m2} — ABNORMAL LOW (ref >59)
Glucose: 61 mg/dL — ABNORMAL LOW (ref 65–99)
Potassium: 4.9 mmol/L (ref 3.5–5.2)
Sodium: 143 mmol/L (ref 134–144)

## 2018-10-05 ENCOUNTER — Telehealth: Payer: Self-pay | Admitting: Cardiovascular Disease

## 2018-10-05 NOTE — Telephone Encounter (Signed)
The daughter has been made aware and educated on why the patient has Furosemide and Spironolactone. She has also been made aware that the patient's lab work came back with a normal potassium. She verbalized her understanding.

## 2018-10-05 NOTE — Telephone Encounter (Signed)
Bethena Roys, Daughter of the patient, called this morning in regards to a new medication that the patient was supposed to start after her appointment 09/14/18. The patient's potassium was high, and  The daughter was told Dr. Loletha Grayer  Would send a medication to her pharmacy to bring the potassium down. The daughter states that there has not been any new medications for her to pick up from the pharmacy. The patient and the daughter are both concerned that the patient's potassium levels have been elevated for more than two weeks now. The pharmacy on file is accurate for the patient.  Please advise

## 2018-10-05 NOTE — Telephone Encounter (Signed)
Left a message for the patient to call back.   Spironolactone 25 mg once daily was prescribed on the 7/20 appointment and was sent in on that day. Lab work completed on 7/30 showed a normal potassium level.

## 2018-10-11 ENCOUNTER — Other Ambulatory Visit: Payer: Self-pay | Admitting: Cardiovascular Disease

## 2018-10-16 ENCOUNTER — Ambulatory Visit (INDEPENDENT_AMBULATORY_CARE_PROVIDER_SITE_OTHER): Payer: Medicare Other | Admitting: *Deleted

## 2018-10-16 DIAGNOSIS — I4430 Unspecified atrioventricular block: Secondary | ICD-10-CM

## 2018-10-18 LAB — CUP PACEART REMOTE DEVICE CHECK
Battery Remaining Longevity: 137 mo
Battery Voltage: 3.03 V
Brady Statistic RV Percent Paced: 98.78 %
Date Time Interrogation Session: 20200821044108
Implantable Lead Implant Date: 20190522
Implantable Lead Location: 753860
Implantable Lead Model: 5076
Implantable Pulse Generator Implant Date: 20190522
Lead Channel Impedance Value: 399 Ohm
Lead Channel Impedance Value: 570 Ohm
Lead Channel Pacing Threshold Amplitude: 0.625 V
Lead Channel Pacing Threshold Pulse Width: 0.4 ms
Lead Channel Sensing Intrinsic Amplitude: 8.125 mV
Lead Channel Sensing Intrinsic Amplitude: 8.125 mV
Lead Channel Setting Pacing Amplitude: 2.5 V
Lead Channel Setting Pacing Pulse Width: 0.4 ms
Lead Channel Setting Sensing Sensitivity: 1.2 mV

## 2018-10-19 NOTE — Progress Notes (Signed)
Virtual Visit via Telephone Note   This visit type was conducted due to national recommendations for restrictions regarding the COVID-19 Pandemic (e.g. social distancing) in an effort to limit this patient's exposure and mitigate transmission in our community.  Due to her co-morbid illnesses, this patient is at least at moderate risk for complications without adequate follow up.  This format is felt to be most appropriate for this patient at this time.  The patient did not have access to video technology/had technical difficulties with video requiring transitioning to audio format only (telephone).  All issues noted in this document were discussed and addressed.  No physical exam could be performed with this format.  Please refer to the patient's chart for her  consent to telehealth for [facility].   Patient has given verbal permission to conduct this visit via virtual appointment and to bill insurance 10/21/2018 10:00 am     Date:  10/21/2018   ID:  Stephanie Atkinson, DOB 1924-12-19, MRN [id number]  Patient Location: Home Provider Location: Office  PCP:  Pearson GrippeKim, James, MD  Cardiologist:   Electrophysiologist:  None   Evaluation Performed:  Follow-Up Visit  Chief Complaint:  Edema and Hypertension  History of Present Illness:    Stephanie Atkinson is a 85 y.o. female we are following for ongoing assessment and management of coronary artery disease, status post CABG, permanent atrial fibrillation with slow ventricular response, single-chamber PPM with history of syncope, (Medtronic Azure XT SR MRI, placed 07/16/2017).  When last seen in the office by Dr. Royann Shiversroitoru, she was found to have volume overload with lower extremity edema and weight gain.  Blood pressure was elevated.  She had NYHA functional class II exertional dyspnea.  As a result of this the patient had increased dose of Lasix to 80 mg daily, spironolactone was added to help with blood pressure and potassium levels.  The patient has done  very well with medication adjustments and additions.  Weight is now 143 pounds down from 162 pounds.  Her baseline weight is 149 pounds.  She is severely restricted her salt intake and is been compliant with her medications.  She reports that she no longer has dyspnea but still has mild edema in her legs, but much better than when she saw Dr.Berry last prior to medication changes. She was increased on lasix to 80 mg daily and spironolactone was added.    Past Medical History:  Diagnosis Date  . Chronic kidney disease, stage 3 (HCC)   . Coronary artery disease   . Hx of CABG 2006   LIMA-LAD, free RIMA-PDA, Radial-OM1-OM2  . Hyperlipidemia   . Hypertension   . Hypothyroid   . MI, old       . PAF (paroxysmal atrial fibrillation) (HCC)    was on amio, d/c'd due to prolonged PR interval, rate control  . Presence of permanent cardiac pacemaker 07/16/2017  . Presence of stent in right coronary artery 07/12   RIMA-PDA occluded, DES stent placed  . Syncope 07/12   bradycardic, beta blocker decreased, also felt to be dehydrated   Past Surgical History:  Procedure Laterality Date  . CARDIAC CATHETERIZATION  [id number]   dr. Onalee Huadavid harding, revealing a atretic bypass to her right with patent distal RCA stent  . CAROTID STENT  2012  . CORONARY ARTERY BYPASS GRAFT  2006   LIMA-LAD, free RIMA-PDA, Radial-OM1-OM2  . DOPPLER ECHOCARDIOGRAPHY  [id number]   mild asymmetric left ventricular hypertrophy, left ventricular systolic function is low  normal, ejection fraction = 50-55%, the LA is moderate dilated, the RA is mildly dilated, no significant valvular disease  . INSERT / REPLACE / REMOVE PACEMAKER  07/16/2017  . JOINT REPLACEMENT     hip replacement  . LEFT HEART CATHETERIZATION WITH CORONARY/GRAFT ANGIOGRAM N/A 10/12/2012   Procedure: LEFT HEART CATHETERIZATION WITH Beatrix Fetters;  Surgeon: Leonie Man, MD;  Location[facility] C[facility] LAB;  Service: Cardiovascular;  Laterality: N/A;  . LOOP  RECORDER IMPLANT N/A 10/15/2012   Procedure: LOOP RECORDER IMPLANT;  Surgeon: Sanda Klein, MD;  Location: [place] CATH LAB;  Service: Cardiovascular;  Laterality: N/A;  . LOOP RECORDER REMOVAL  07/16/2017  . LOOP RECORDER REMOVAL N/A 07/16/2017   Procedure: LOOP RECORDER REMOVAL;  Surgeon: Sanda Klein, MD;  Location: [place] CV LAB;  Service: Cardiovascular;  Laterality: N/A;  . NM MYOVIEW LTD  [id number]   post stress left ventricle is normal in size, post stress ejection fraction is 67% global left ventricular systolic function is normal, normal myocardial perfusion study, abnormal myocardial perfusion study, low risk scan  . PACEMAKER IMPLANT N/A 07/16/2017   Procedure: PACEMAKER IMPLANT;  Surgeon: Sanda Klein, MD;  Location: [place] CV LAB;  Service: Cardiovascular;  Laterality: N/A;  . THYROIDECTOMY       Current Meds  Medication Sig  . acetaminophen (TYLENOL) 500 MG tablet Take 500-1,000 mg by mouth every 6 (six) hours as needed for moderate pain.  [name] amLODipine (NORVASC) 10 MG tablet TAKE 1 TABLET BY MOUTH EVERY DAY  . atorvastatin (LIPITOR) 80 MG tablet Take 80 mg by mouth daily.  . Calcium-Vit D-Arg-Inos-Silicon (BONE DENSITY) 300-200 MG-UNIT TABS Take 1 tablet by mouth daily.  . cholecalciferol (VITAMIN D3) 25 MCG (1000 UT) tablet Take 1,000 Units by mouth daily.  . clobetasol cream (TEMOVATE) 8.85 % Apply 1 application topically 3 (three) times daily as needed (to legs).   . diphenhydrAMINE (BENADRYL) 25 MG tablet Take 25 mg by mouth every 6 (six) hours as needed for allergies.  [name] ELIQUIS 5 MG TABS tablet TAKE 1 TABLET BY MOUTH TWICE A DAY  . furosemide (LASIX) 40 MG tablet Take 2 tablets ( 80 mg ) daily until weight gets to 150 lbs then take 40 mg daily.  . hydroxypropyl methylcellulose / hypromellose (ISOPTO TEARS / GONIOVISC) 2.5 % ophthalmic solution Place 1 drop into both eyes 3 (three) times daily as needed for dry eyes.  [name] levothyroxine (SYNTHROID, LEVOTHROID) 50 MCG  tablet Take 50 mcg by mouth daily.  [name] lidocaine (LIDODERM) 5 % Place 1 patch onto the skin daily. Remove & Discard patch within 12 hours or as directed by MD  . methocarbamol (ROBAXIN) 500 MG tablet Take 1 tablet (500 mg total) by mouth 2 (two) times daily.  . nitroGLYCERIN (NITROSTAT) 0.4 MG SL tablet Place 0.4 mg under the tongue every 5 (five) minutes as needed. For chest pain  . spironolactone (ALDACTONE) 25 MG tablet Take 1 tablet (25 mg total) by mouth daily.  . traMADol (ULTRAM) 50 MG tablet Take 50 mg by mouth daily as needed.   . valsartan (DIOVAN) 160 MG tablet Take 160 mg by mouth daily.     Allergies:   Penicillins and Sulfa antibiotics   Social History   Tobacco Use  . Smoking status: Never Smoker  . Smokeless tobacco: Current User    Types: Snuff  Substance Use Topics  . Alcohol use: No  . Drug use: No     Family Hx: The patient's family  history includes CAD in her maternal grandmother and mother.  ROS:   Please see the history of present illness.    All other systems reviewed and are negative.   Prior CV studies:   The following studies were reviewed today: Pacemaker interrogation 10/16/2018.  Normal device function  Labs/Other Tests and Data Reviewed:    EKG:  No ECG reviewed.  Recent Labs: 06/27/2018: ALT 22; Hemoglobin 12.9; Platelets 197 09/24/2018: BUN 21; Creatinine, Ser 1.13; Potassium 4.9; Sodium 143   Recent Lipid Panel Lab Results  Component Value Date/Time   CHOL 127 11/25/2016 10:05 AM   TRIG 117 11/25/2016 10:05 AM   HDL 46 11/25/2016 10:05 AM   CHOLHDL 2.8 11/25/2016 10:05 AM   CHOLHDL 2.6 01/05/2015 11:18 AM   LDLCALC 58 11/25/2016 10:05 AM    Wt Readings from Last 3 Encounters:  10/21/18 143 lb (64.9 kg)  09/14/18 162 lb (73.5 kg)  06/27/18 149 lb (67.6 kg)     Objective:    Vital Signs:  BP 139/77   Pulse 87   Ht 5\' 3"  (1.6 m)   Wt 143 lb (64.9 kg)   BMI 25.33 kg/m   Limited assessment due to telephone visit VITAL SIGNS:   reviewed GEN:  no acute distress RESPIRATORY:  normal respiratory effort, symmetric expansion PSYCH:  normal affect  ASSESSMENT & PLAN:    1.  Coronary artery disease: Patient has no complaints of angina symptoms.  She remains active for her age.  She is medically compliant.  No changes in her current medication regimen.  2.  Chronic diastolic heart failure: Much improved with increased dose of Lasix 80 mg daily, and addition of spironolactone 25 mg daily.  Repeat labs revealed normal kidney function with creatinine of 1.13, potassium 4.9.  She is tolerating higher doses of Lasix and addition of spironolactone without dizziness, breast pain, or fatigue.  Weight has come down considerably, approximately 20 pounds.  She is more strict on salt intake.  3.  Atrial fibrillation: She denies any palpitations or rapid heart rhythm.  She remains on Eliquis 5 mg twice daily.  No complaints of bleeding.  She is not on any AV nodal blocking agents at this time.  4.  Hypertension: Remains on amlodipine, valsartan, and spironolactone, with improvement in blood pressure to 139/77 today we will continue current medication regimen as she is responded nicely to this.  5.  Hyperlipidemia: Continue atorvastatin 80 mg daily, goal of LDL less than 70.   COVID-19 Education: The signs and symptoms of COVID-19 were discussed with the patient and how to seek care for testing (follow up with PCP or arrange E-visit).  The importance of social distancing was discussed today.  Time:   Today, I have spent 15 minutes with the patient with telehealth technology discussing the above problems.     Medication Adjustments/Labs and Tests Ordered: Current medicines are reviewed at length with the patient today.  Concerns regarding medicines are outlined above.   Tests Ordered: No orders of the defined types were placed in this encounter.   Medication Changes: No orders of the defined types were placed in this  encounter.   Disposition:  Follow up 6 months with Dr. Royann Shiversroitoru nd one year with Dr.Berry.   Signed, Bettey MareKathryn M. Liborio NixonLawrence DNP, ANP, AACC  10/21/2018 10:38 AM     [facility]

## 2018-10-21 ENCOUNTER — Encounter: Payer: Self-pay | Admitting: Adult Health

## 2018-10-21 ENCOUNTER — Other Ambulatory Visit: Payer: Self-pay

## 2018-10-21 ENCOUNTER — Telehealth (INDEPENDENT_AMBULATORY_CARE_PROVIDER_SITE_OTHER): Payer: Medicare Other | Admitting: Adult Health

## 2018-10-21 VITALS — BP 139/77 | HR 87 | Ht 63.0 in | Wt 143.0 lb

## 2018-10-21 DIAGNOSIS — I5032 Chronic diastolic (congestive) heart failure: Secondary | ICD-10-CM

## 2018-10-21 DIAGNOSIS — I2510 Atherosclerotic heart disease of native coronary artery without angina pectoris: Secondary | ICD-10-CM

## 2018-10-21 DIAGNOSIS — I4811 Longstanding persistent atrial fibrillation: Secondary | ICD-10-CM

## 2018-10-21 DIAGNOSIS — E7800 Pure hypercholesterolemia, unspecified: Secondary | ICD-10-CM

## 2018-10-21 DIAGNOSIS — I10 Essential (primary) hypertension: Secondary | ICD-10-CM

## 2018-10-21 NOTE — Patient Instructions (Signed)
Medication Instructions:  Continue current medications  If you need a refill on your cardiac medications before your next appointment, please call your pharmacy.  Labwork: None Ordered   Testing/Procedures: None Ordered  Follow-Up: You will need a follow up appointment in 1 Year.  Please call our office 2 months in advance to schedule this appointment.  You may see Quay Burow, MD or one of the following Advanced Practice Providers on your designated Care Team:   Kerin Ransom, PA-C Roby Lofts, [place] . Sande Rives, PA-C     At [facility], you and your health needs are our priority.  As part of our continuing mission to provide you with exceptional heart care, we have created designated Provider Care Teams.  These Care Teams include your primary Cardiologist (physician) and Advanced Practice Providers (APPs -  Physician Assistants and Nurse Practitioners) who all work together to provide you with the care you need, when you need it.  Thank you for choosing [facility] at [facility]!!

## 2018-10-23 ENCOUNTER — Encounter: Payer: Self-pay | Admitting: Cardiology

## 2018-10-23 NOTE — Progress Notes (Signed)
Remote pacemaker transmission.   

## 2018-11-12 ENCOUNTER — Other Ambulatory Visit: Payer: Self-pay | Admitting: Cardiovascular Disease

## 2018-12-24 ENCOUNTER — Telehealth: Payer: Self-pay | Admitting: Cardiovascular Disease

## 2018-12-24 DIAGNOSIS — I10 Essential (primary) hypertension: Secondary | ICD-10-CM

## 2018-12-24 DIAGNOSIS — Z79899 Other long term (current) drug therapy: Secondary | ICD-10-CM

## 2018-12-24 MED ORDER — FUROSEMIDE 40 MG PO TABS: ORAL_TABLET | ORAL | 3 refills | Status: DC

## 2018-12-24 NOTE — Telephone Encounter (Signed)
She has not taken her BP lately but she went to the doctor on Monday ans it was as she remembers 138/38 HR 70. She states that she does have a BP cuff now it is 162/65 HR 87. She states that she "rushed too much over there" and would like to take it again 151/69 HR82. Will call Dr Kim/ [facility] office to see if they could fax notes from Monday 12-21-2018 appt/labs. LMVM @ [place] [facility]/Dr Kim's office to [phone] fax [phone]. Faxed request for these notes/labs

## 2018-12-24 NOTE — Telephone Encounter (Signed)
Pt c/o medication issue:  1. Name of Medication: furosemide (LASIX) 40 MG tablet / spironolactone (ALDACTONE) 25 MG tablet(Expired)  2. How are you currently taking this medication (dosage and times per day)? As directed  3. Are you having a reaction (difficulty breathing--STAT)? no  4. What is your medication issue? Patient states that she is tired and has no energy and was wondering if this is because of the medication. States she has to get up to pee throughout the night several times.

## 2018-12-24 NOTE — Telephone Encounter (Signed)
Please ask her what her BP is and order a BMET and Mg.

## 2018-12-24 NOTE — Telephone Encounter (Signed)
Returned call to pt she states that she is very fatigued and has no energy. She states that she thinks that this is fron taking her lasix 40mg  daily and Spironolactone 25mg  daily. Upon our talking she admitted to me that the last time she took the lasix was Friday. I informed her that she needs to start taking lasix again as she is c/o swelling. But then stated that they do not help because her ankles are always swollen even when she takes the lasix. I informed her that as long as she is urinating "all the time" it is working. Once again informed her that she needs to start taking and she just changed the subject. She is wanting to change these medications because she states that she is tired all the time and has no energy all the time and this is not like her. Will forward to Dr Sallyanne Kuster for review.she states that her weight is currently 142#

## 2018-12-25 MED ORDER — FUROSEMIDE 20 MG PO TABS: 20.0000 mg | ORAL_TABLET | Freq: Every day | ORAL | 11 refills | Status: DC

## 2018-12-25 MED ORDER — SPIRONOLACTONE 25 MG PO TABS: 12.5000 mg | ORAL_TABLET | Freq: Every day | ORAL | 11 refills | Status: DC

## 2018-12-25 NOTE — Addendum Note (Signed)
Addended by: Ricci Barker on: 12/25/2018 12:22 PM   Modules accepted: Orders

## 2018-12-25 NOTE — Telephone Encounter (Signed)
Labs 10/19 suggest hypovolemia. She feels weak. Feels better on days that she does not take the diuretic. Swelling is in her knee, not dependent edema. Skip one day of diuretics completely (no furosemide and no spironolactone) and restart them both at half of the previous dose. Weigh daily and call us if weight reaches 150 lb or higher. Recheck BMET in 4-6 weeks.

## 2018-12-25 NOTE — Telephone Encounter (Signed)
Spoke with the patient. She stated that she feels tired and drained when she takes her Furosemide and Spironolactone. She denies any swelling except for her right knee that is followed by her orthopedist.   Per Dr. Sallyanne Kuster, the patient should skip the Furosemide and Spironolactone for the next 24 hours and then decrease the Furosemide to 20 mg daily and decrease the Spironolactone to 12.5 mg once daily.   The patient started that she felt like she could not split the spironolactone so per Dr. Sallyanne Kuster she could take the Spironolactone every other day 25 mg every other day)  She has been advised to call if her weight gets over 150 pounds. Current weight today was 143 pounds.   She has repeated back the instructions and verbalized her understanding.

## 2018-12-31 ENCOUNTER — Telehealth: Payer: Self-pay | Admitting: Cardiovascular Disease

## 2018-12-31 NOTE — Telephone Encounter (Signed)
LM2CB 

## 2018-12-31 NOTE — Telephone Encounter (Signed)
New Message:   Please call, question about pt's Spironolactone.

## 2019-01-01 NOTE — Telephone Encounter (Signed)
Spoke with pt and daughter, questions regarding furosemide and spironolactone answered.

## 2019-01-01 NOTE — Telephone Encounter (Signed)
Left message for pt daughter to call  ?

## 2019-01-15 ENCOUNTER — Ambulatory Visit (INDEPENDENT_AMBULATORY_CARE_PROVIDER_SITE_OTHER): Payer: Medicare Other | Admitting: *Deleted

## 2019-01-15 DIAGNOSIS — R001 Bradycardia, unspecified: Secondary | ICD-10-CM

## 2019-01-15 DIAGNOSIS — I4891 Unspecified atrial fibrillation: Secondary | ICD-10-CM

## 2019-01-15 LAB — CUP PACEART REMOTE DEVICE CHECK
Battery Remaining Longevity: 132 mo
Battery Voltage: 3.02 V
Brady Statistic RV Percent Paced: 99.03 %
Date Time Interrogation Session: 20201119234233
Implantable Lead Implant Date: 20190522
Implantable Lead Location: 753860
Implantable Lead Model: 5076
Implantable Pulse Generator Implant Date: 20190522
Lead Channel Impedance Value: 361 Ohm
Lead Channel Impedance Value: 532 Ohm
Lead Channel Pacing Threshold Amplitude: 0.625 V
Lead Channel Pacing Threshold Pulse Width: 0.4 ms
Lead Channel Sensing Intrinsic Amplitude: 8.125 mV
Lead Channel Sensing Intrinsic Amplitude: 8.125 mV
Lead Channel Setting Pacing Amplitude: 2.5 V
Lead Channel Setting Pacing Pulse Width: 0.4 ms
Lead Channel Setting Sensing Sensitivity: 1.2 mV

## 2019-01-26 ENCOUNTER — Telehealth: Payer: Self-pay | Admitting: Cardiovascular Disease

## 2019-01-26 NOTE — Telephone Encounter (Signed)
Returned call to pr Pt informed of providers result & recommendations. Pt verbalized understanding. She will restart spironolactone 12.5mg  daily and continue  Lasix 20mg daily. No further questions . She will CB if any other sx/side-effects

## 2019-01-26 NOTE — Telephone Encounter (Signed)
New Message     Pt c/o medication issue:  1. Name of Medication: spironolactone (ALDACTONE) 25 MG tablet  2. How are you currently taking this medication (dosage and times per day)? Stopped taking it   3. Are you having a reaction (difficulty breathing--STAT)? No   4. What is your medication issue? Pt says she was having some diarrhea and it makes her feel terrible

## 2019-01-26 NOTE — Telephone Encounter (Signed)
Spironolactone may cause diarrhea, but patient on spironolactone since July/2020.  If this a new symptom?  Noted dose decreased by DR Croitoru on October/30/2020.   Recommendation:   1. HOLD spironolactone dose for 2 days, and stay hydrated, then   2. Resume spironolactone low dose.  if diarrhea resumes, please contact DR C for therapy adjustment

## 2019-01-27 ENCOUNTER — Encounter: Payer: Self-pay | Admitting: *Deleted

## 2019-01-27 ENCOUNTER — Other Ambulatory Visit: Payer: Self-pay | Admitting: *Deleted

## 2019-01-27 DIAGNOSIS — Z79899 Other long term (current) drug therapy: Secondary | ICD-10-CM

## 2019-01-27 DIAGNOSIS — I10 Essential (primary) hypertension: Secondary | ICD-10-CM

## 2019-02-05 LAB — BASIC METABOLIC PANEL
BUN/Creatinine Ratio: 20 (ref 12–28)
BUN: 22 mg/dL (ref 10–36)
CO2: 22 mmol/L (ref 20–29)
Calcium: 10.8 mg/dL — ABNORMAL HIGH (ref 8.7–10.3)
Chloride: 106 mmol/L (ref 96–106)
Creatinine, Ser: 1.11 mg/dL — ABNORMAL HIGH (ref 0.57–1.00)
GFR calc Af Amer: 50 mL/min/{1.73_m2} — ABNORMAL LOW (ref >59)
GFR calc non Af Amer: 43 mL/min/{1.73_m2} — ABNORMAL LOW (ref >59)
Glucose: 78 mg/dL (ref 65–99)
Potassium: 4.8 mmol/L (ref 3.5–5.2)
Sodium: 142 mmol/L (ref 134–144)

## 2019-02-09 ENCOUNTER — Ambulatory Visit: Payer: Medicare Other | Admitting: Orthopaedic Surgery

## 2019-02-09 ENCOUNTER — Other Ambulatory Visit: Payer: Self-pay | Admitting: Cardiovascular Disease

## 2019-02-09 NOTE — Progress Notes (Signed)
Remote pacemaker transmission.   

## 2019-02-09 NOTE — Telephone Encounter (Signed)
Refill Request.  

## 2019-03-02 ENCOUNTER — Other Ambulatory Visit: Payer: Self-pay

## 2019-03-02 ENCOUNTER — Ambulatory Visit (INDEPENDENT_AMBULATORY_CARE_PROVIDER_SITE_OTHER): Payer: Medicare PPO

## 2019-03-02 ENCOUNTER — Encounter: Payer: Self-pay | Admitting: Orthopaedic Surgery

## 2019-03-02 ENCOUNTER — Ambulatory Visit (INDEPENDENT_AMBULATORY_CARE_PROVIDER_SITE_OTHER): Payer: Medicare PPO | Admitting: Orthopaedic Surgery

## 2019-03-02 VITALS — Ht 63.0 in | Wt 144.0 lb

## 2019-03-02 DIAGNOSIS — M25512 Pain in left shoulder: Secondary | ICD-10-CM

## 2019-03-02 DIAGNOSIS — M549 Dorsalgia, unspecified: Secondary | ICD-10-CM

## 2019-03-02 DIAGNOSIS — M659 Synovitis and tenosynovitis, unspecified: Secondary | ICD-10-CM | POA: Insufficient documentation

## 2019-03-02 DIAGNOSIS — G8929 Other chronic pain: Secondary | ICD-10-CM | POA: Diagnosis not present

## 2019-03-02 DIAGNOSIS — M25561 Pain in right knee: Secondary | ICD-10-CM

## 2019-03-02 MED ORDER — BUPIVACAINE HCL 0.25 % IJ SOLN
4.0000 mL | INTRAMUSCULAR | Status: AC | PRN
  Administered 2019-03-02: 4 mL via INTRA_ARTICULAR

## 2019-03-02 MED ORDER — LIDOCAINE HCL 1 % IJ SOLN
0.5000 mL | INTRAMUSCULAR | Status: AC | PRN
  Administered 2019-03-02: 11:00:00 .5 mL

## 2019-03-02 MED ORDER — METHYLPREDNISOLONE ACETATE 40 MG/ML IJ SUSP
40.0000 mg | INTRAMUSCULAR | Status: AC | PRN
  Administered 2019-03-02: 40 mg via INTRA_ARTICULAR

## 2019-03-02 NOTE — Progress Notes (Signed)
Office Visit Note   Patient: Stephanie Atkinson           Date of Birth: 12/10/1924           MRN: [id number] Visit Date: 03/02/2019              Requested by: Pearson Grippe, MD [address] [place],  [place] [address] PCP: Pearson Grippe, MD   Assessment & Plan: Visit Diagnoses:  1. Chronic left shoulder pain   2. Mid back pain   3. Synovitis of right knee     Plan: Right knee injection performed which she tolerated well.  She will return if she has ongoing problems with her shoulder.  We discussed the likelihood that she has a rotator cuff tear.  She will try to avoid outstretch and overhead activities.  Follow-up as needed.  Follow-Up Instructions: No follow-ups on file.   Orders:  Orders Placed This Encounter  Procedures  . XR Thoracic Spine 2 View  . XR Shoulder Left   No orders of the defined types were placed in this encounter.     Procedures: Large Joint Inj: R knee on 03/02/2019 11:20 AM Indications: pain and joint swelling Details: 22 G 1.5 in needle, anterolateral approach  Arthrogram: No  Medications: 40 mg methylPREDNISolone acetate 40 MG/ML; 0.5 mL lidocaine 1 %; 4 mL bupivacaine 0.25 % Outcome: tolerated well, no immediate complications Procedure, treatment alternatives, risks and benefits explained, specific risks discussed. Consent was given by the patient. Immediately prior to procedure a time out was called to verify the correct patient, procedure, equipment, support staff and site/side marked as required. Patient was prepped and draped in the usual sterile fashion.       Clinical Data: No additional findings.   Subjective: Chief Complaint  Patient presents with  . Left Shoulder - Pain  . Middle Back - Pain  . Right Knee - Pain    HPI 86 year old female returns with increased problems with right knee pain.  Previous aspiration injection 1 year ago gave her good relief all the way through summer.  She also had pain in her left shoulder  with active range of motion has difficulty reaching it above the level of hand to ear.  She is used Tylenol as well as tramadol.  Is also had some back pain pain that radiates around the rib cage.  Shoulder is worse than the late afternoon.  Right knee is painful with walking and she is noted swelling in her knee with catching.  Review of Systems 14 point system update unchanged from 06/10/2017 office visit other than as mentioned HPI.  Positive history for CABG procedure no current angina.  Right knee osteoarthritis.   Objective: Vital Signs: Ht 5\' 3"  (1.6 m)   Wt 144 lb (65.3 kg)   BMI 25.51 kg/m   Physical Exam Constitutional:      Appearance: She is well-developed.  HENT:     Head: Normocephalic.     Right Ear: External ear normal.     Left Ear: External ear normal.  Eyes:     Pupils: Pupils are equal, round, and reactive to light.  Neck:     Thyroid: No thyromegaly.     Trachea: No tracheal deviation.  Cardiovascular:     Rate and Rhythm: Normal rate.  Pulmonary:     Effort: Pulmonary effort is normal.  Abdominal:     Palpations: Abdomen is soft.  Skin:    General: Skin is warm  and dry.  Neurological:     Mental Status: She is alert and oriented to person, place, and time.  Psychiatric:        Behavior: Behavior normal.     Ortho Exam patient is amatory with a right knee limp.  She has crepitus with knee range of motion more on the right than left knee.  She has pain with terminal extension and hyperextension pressure.  Collateral ligaments are stable.  Crepitus patellofemoral joint.  Trace lower extremity edema she has compression stockings.  Specialty Comments:  No specialty comments available.  Imaging: No results found.   PMFS History: Patient Active Problem List   Diagnosis Date Noted  . Synovitis of right knee 03/02/2019  . Chronic diastolic heart failure (HCC) 09/14/2018  . Pacemaker 07/16/2017  . Atrial fibrillation with slow ventricular response (HCC)  06/27/2017  . Hypercholesterolemia 06/27/2017  . Symptomatic bradycardia 06/27/2017  . Chronic pain of right knee 05/17/2016  . De Quervain's tenosynovitis 05/17/2016  . CAD (coronary artery disease) of artery bypass graft 02/13/2015  . Encounter for loop recorder check 02/01/2014  . Accelerated junctional rhythm 05/04/2013  . Dehydration 10/14/2012  . Syncope and collapse 10/13/2012  . History of first degree atrioventricular block 10/11/2012  . Long term (current) use of anticoagulants 10/06/2012  . AVB - beta blocker and Amiodarone stopped 07/10/2012  . Hypertension   . Hx of CABG X 4 3/06-    . Dyslipidemia   . RCA DES placed 7/12- patent 8/14   . Permanent atrial fibrillation (HCC)   . Chronic kidney disease, stage 3    Past Medical History:  Diagnosis Date  . Chronic kidney disease, stage 3   . Coronary artery disease   . Hx of CABG 2006   LIMA-LAD, free RIMA-PDA, Radial-OM1-OM2  . Hyperlipidemia   . Hypertension   . Hypothyroid   . MI, old       . PAF (paroxysmal atrial fibrillation) (HCC)    was on amio, d/c'd due to prolonged PR interval, rate control  . Presence of permanent cardiac pacemaker 07/16/2017  . Presence of stent in right coronary artery 07/12   RIMA-PDA occluded, DES stent placed  . Syncope 07/12   bradycardic, beta blocker decreased, also felt to be dehydrated    Family History  Problem Relation Age of Onset  . CAD Mother   . CAD Maternal Grandmother     Past Surgical History:  Procedure Laterality Date  . CARDIAC CATHETERIZATION  [id number]   dr. Onalee Hua harding, revealing a atretic bypass to her right with patent distal RCA stent  . CAROTID STENT  2012  . CORONARY ARTERY BYPASS GRAFT  2006   LIMA-LAD, free RIMA-PDA, Radial-OM1-OM2  . DOPPLER ECHOCARDIOGRAPHY  [id number]   mild asymmetric left ventricular hypertrophy, left ventricular systolic function is low normal, ejection fraction = 50-55%, the LA is moderate dilated, the RA is mildly dilated, no  significant valvular disease  . INSERT / REPLACE / REMOVE PACEMAKER  07/16/2017  . JOINT REPLACEMENT     hip replacement  . LEFT HEART CATHETERIZATION WITH CORONARY/GRAFT ANGIOGRAM N/A 10/12/2012   Procedure: LEFT HEART CATHETERIZATION WITH Isabel Caprice;  Surgeon: Marykay Lex, MD;  Location: [facility] CATH LAB;  Service: Cardiovascular;  Laterality: N/A;  . LOOP RECORDER IMPLANT N/A 10/15/2012   Procedure: LOOP RECORDER IMPLANT;  Surgeon: Thurmon Fair, MD;  Location: [facility] CATH LAB;  Service: Cardiovascular;  Laterality: N/A;  . LOOP RECORDER REMOVAL  07/16/2017  . LOOP RECORDER  REMOVAL N/A 07/16/2017   Procedure: LOOP RECORDER REMOVAL;  Surgeon: Sanda Klein, MD;  Location: [place] CV LAB;  Service: Cardiovascular;  Laterality: N/A;  . NM MYOVIEW LTD  [id number]   post stress left ventricle is normal in size, post stress ejection fraction is 67% global left ventricular systolic function is normal, normal myocardial perfusion study, abnormal myocardial perfusion study, low risk scan  . PACEMAKER IMPLANT N/A 07/16/2017   Procedure: PACEMAKER IMPLANT;  Surgeon: Sanda Klein, MD;  Location: [place] CV LAB;  Service: Cardiovascular;  Laterality: N/A;  . THYROIDECTOMY     Social History   Occupational History  . Not on file  Tobacco Use  . Smoking status: Never Smoker  . Smokeless tobacco: Current User    Types: Snuff  Substance and Sexual Activity  . Alcohol use: No  . Drug use: No  . Sexual activity: Not on file

## 2019-03-08 ENCOUNTER — Telehealth: Payer: Self-pay | Admitting: Cardiovascular Disease

## 2019-03-08 NOTE — Telephone Encounter (Signed)
XRAY showed cardiomegaly. XRAY not the best way to assess this and in a 86 year old I'm not concerned

## 2019-03-08 NOTE — Telephone Encounter (Signed)
Patient's daughter called stating that there were findings in x-ray taken on patient's shoulder. Please call.

## 2019-03-09 ENCOUNTER — Telehealth: Payer: Self-pay

## 2019-03-09 NOTE — Telephone Encounter (Signed)
Daughter of pt called asking about xray's from pt. Gave Dr Hazle Coca results stating cardiomegaly. Explained to pt daughter that it is an enlarged heart and Dr Allyson Sabal was not concerned with it at the moment d/t pt age. Pt daughter asked if the previous chest xray's has showed cardiomegaly. Informed pt daughter that this nurse could not interpret the tests but told her the x-rays from 03/2018 and 06/2017 findings showed cardiomegaly. Pt daughter verbalized understanding.

## 2019-03-09 NOTE — Telephone Encounter (Signed)
Daughter returning a call from [place]. Please call back. Patient is anxious to hear results

## 2019-03-09 NOTE — Telephone Encounter (Signed)
Called and LVM for pt daughter to call back to discuss Xray findings.

## 2019-04-16 ENCOUNTER — Ambulatory Visit (INDEPENDENT_AMBULATORY_CARE_PROVIDER_SITE_OTHER): Payer: Medicare PPO | Admitting: *Deleted

## 2019-04-16 DIAGNOSIS — I4891 Unspecified atrial fibrillation: Secondary | ICD-10-CM | POA: Diagnosis not present

## 2019-04-16 LAB — CUP PACEART REMOTE DEVICE CHECK
Battery Remaining Longevity: 129 mo
Battery Voltage: 3.02 V
Brady Statistic RV Percent Paced: 98.98 %
Date Time Interrogation Session: 20210218232243
Implantable Lead Implant Date: 20190522
Implantable Lead Location: 753860
Implantable Lead Model: 5076
Implantable Pulse Generator Implant Date: 20190522
Lead Channel Impedance Value: 361 Ohm
Lead Channel Impedance Value: 532 Ohm
Lead Channel Pacing Threshold Amplitude: 0.625 V
Lead Channel Pacing Threshold Pulse Width: 0.4 ms
Lead Channel Sensing Intrinsic Amplitude: 7.875 mV
Lead Channel Sensing Intrinsic Amplitude: 7.875 mV
Lead Channel Setting Pacing Amplitude: 2.5 V
Lead Channel Setting Pacing Pulse Width: 0.4 ms
Lead Channel Setting Sensing Sensitivity: 1.2 mV

## 2019-04-16 NOTE — Progress Notes (Signed)
PPM Remote  

## 2019-04-28 ENCOUNTER — Encounter: Payer: Medicare PPO | Admitting: Cardiovascular Disease

## 2019-04-29 ENCOUNTER — Telehealth (INDEPENDENT_AMBULATORY_CARE_PROVIDER_SITE_OTHER): Payer: Medicare PPO | Admitting: Cardiovascular Disease

## 2019-04-29 VITALS — BP 134/79 | HR 65 | Ht 63.0 in | Wt 147.0 lb

## 2019-04-29 DIAGNOSIS — I25810 Atherosclerosis of coronary artery bypass graft(s) without angina pectoris: Secondary | ICD-10-CM | POA: Diagnosis not present

## 2019-04-29 DIAGNOSIS — I10 Essential (primary) hypertension: Secondary | ICD-10-CM

## 2019-04-29 DIAGNOSIS — Z7901 Long term (current) use of anticoagulants: Secondary | ICD-10-CM

## 2019-04-29 DIAGNOSIS — Z951 Presence of aortocoronary bypass graft: Secondary | ICD-10-CM | POA: Diagnosis not present

## 2019-04-29 DIAGNOSIS — I4891 Unspecified atrial fibrillation: Secondary | ICD-10-CM | POA: Diagnosis not present

## 2019-04-29 DIAGNOSIS — I4821 Permanent atrial fibrillation: Secondary | ICD-10-CM | POA: Diagnosis not present

## 2019-04-29 DIAGNOSIS — I5032 Chronic diastolic (congestive) heart failure: Secondary | ICD-10-CM

## 2019-04-29 DIAGNOSIS — Z950 Presence of cardiac pacemaker: Secondary | ICD-10-CM

## 2019-04-29 NOTE — Patient Instructions (Signed)
Medication Instructions:  No changes If you need a refill on your cardiac medications before your next appointment, please call your pharmacy.   Lab work: None ordered If you have labs (blood work) drawn today and your tests are completely normal, you will receive your results only by: MyChart Message (if you have MyChart) OR A paper copy in the mail If you have any lab test that is abnormal or we need to change your treatment, we will call you to review the results.  Testing/Procedures: None ordered  Follow-Up: At [facility], you and your health needs are our priority.  As part of our continuing mission to provide you with exceptional heart care, we have created designated Provider Care Teams.  These Care Teams include your primary Cardiologist (physician) and Advanced Practice Providers (APPs -  Physician Assistants and Nurse Practitioners) who all work together to provide you with the care you need, when you need it.  We recommend signing up for the patient portal called "MyChart".  Sign up information is provided on this After Visit Summary.  MyChart is used to connect with patients for Virtual Visits (Telemedicine).  Patients are able to view lab/test results, encounter notes, upcoming appointments, etc.  Non-urgent messages can be sent to your provider as well.   To learn more about what you can do with MyChart, go to [email or web address].    Your next appointment:   12 month(s)  The format for your next appointment:   In Person  Provider:   Thurmon Fair, MD   Remote monitoring is used to monitor your pacemaker from home. This monitoring reduces the number of office visits required to check your device to one time per year. It allows [place] to keep an eye on the functioning of your device to ensure it is working properly. You are scheduled for a device check from home on 07/16/19. You may send your transmission at any time that day. If you have a wireless device, the  transmission will be sent automatically.

## 2019-04-29 NOTE — Progress Notes (Signed)
Virtual Visit via Telephone Note   This visit type was conducted due to national recommendations for restrictions regarding the COVID-19 Pandemic (e.g. social distancing) in an effort to limit this patient's exposure and mitigate transmission in our community.  Due to her co-morbid illnesses, this patient is at least at moderate risk for complications without adequate follow up.  This format is felt to be most appropriate for this patient at this time.  The patient did not have access to video technology/had technical difficulties with video requiring transitioning to audio format only (telephone).  All issues noted in this document were discussed and addressed.  No physical exam could be performed with this format.  Please refer to the patient's chart for her  consent to telehealth for [facility].   The patient was identified using 2 identifiers.  Date:  04/29/2019   ID:  Stephanie Atkinson, DOB [birth year], MRN [id number]  Patient Location: Home Provider Location: Other:  [facility]  PCP:  Jani Gravel, MD  Cardiologist:  Quay Burow, MD  Electrophysiologist:  None   Evaluation Performed:  Follow-Up Visit  Chief Complaint:  AFib, pacemaker  History of Present Illness:    Stephanie Atkinson is a 86 y.o. female with CAD s/p CABG, permanent AFib with slow ventricular response, remote history of syncope and single-chamber permanent pacemaker (Medtronic Azure XT SR MRI, Jul 16, 2017).   She is generally doing quite well.  Her weight has been very stable around 147 pounds.  Her blood pressure is a little high today but usually it is normal.  She keeps a detailed record and the values have been 108-126/65-77.  She has not had lower extremity edema.  She develops exertional edema only when "rushing".  She has no trouble with activities of daily living or toileting.  NYHA functional class II.  She has not had any chest pain.  She denies dizziness, palpitations, syncope or falls.  She has not  had any bleeding problems.  Last summer she was having some problems with weight gain and shortness of breath and improved after we increased the furosemide dose and added spironolactone.  Later in the year labs showed that her creatinine had increased to 1.49 and we cut the dose of furosemide and spironolactone in half.  Her creatinine is back to baseline around 1.1.  Very recent remote pacemaker download shows 99% ventricular paced rhythm (underlying rhythm is atrial fibrillation with slow ventricular response) and a single 7 beat run of high ventricular rate, probably nonsustained VT.  Normal device function.  She is scheduled to have labs with her primary care provider in April.  The patient does not have symptoms concerning for COVID-19 infection (fever, chills, cough, or new shortness of breath).    Past Medical History:  Diagnosis Date  . Chronic kidney disease, stage 3   . Coronary artery disease   . Hx of CABG 2006   LIMA-LAD, free RIMA-PDA, Radial-OM1-OM2  . Hyperlipidemia   . Hypertension   . Hypothyroid   . MI, old       . PAF (paroxysmal atrial fibrillation) (HCC)    was on amio, d/c'd due to prolonged PR interval, rate control  . Presence of permanent cardiac pacemaker 07/16/2017  . Presence of stent in right coronary artery 07/12   RIMA-PDA occluded, DES stent placed  . Syncope 07/12   bradycardic, beta blocker decreased, also felt to be dehydrated   Past Surgical History:  Procedure Laterality Date  . CARDIAC  CATHETERIZATION  629528   dr. Onalee Hua harding, revealing a atretic bypass to her right with patent distal RCA stent  . CAROTID STENT  2012  . CORONARY ARTERY BYPASS GRAFT  2006   LIMA-LAD, free RIMA-PDA, Radial-OM1-OM2  . DOPPLER ECHOCARDIOGRAPHY  [id number]   mild asymmetric left ventricular hypertrophy, left ventricular systolic function is low normal, ejection fraction = 50-55%, the LA is moderate dilated, the RA is mildly dilated, no significant valvular disease   . INSERT / REPLACE / REMOVE PACEMAKER  07/16/2017  . JOINT REPLACEMENT     hip replacement  . LEFT HEART CATHETERIZATION WITH CORONARY/GRAFT ANGIOGRAM N/A 10/12/2012   Procedure: LEFT HEART CATHETERIZATION WITH Isabel Caprice;  Surgeon: Marykay Lex, MD;  [facility] CATH LAB;  Service: Cardiovascular;  Laterality: N/A;  . LOOP RECORDER IMPLANT N/A 10/15/2012   Procedure: LOOP RECORDER IMPLANT;  Surgeon: Thurmon Fair, MD;  Location: [facility] CATH LAB;  Service: Cardiovascular;  Laterality: N/A;  . LOOP RECORDER REMOVAL  07/16/2017  . LOOP RECORDER REMOVAL N/A 07/16/2017   Procedure: LOOP RECORDER REMOVAL;  Surgeon: Thurmon Fair, MD;  Location: [facility] INVASIVE CV LAB;  Service: Cardiovascular;  Laterality: N/A;  . NM MYOVIEW LTD  100510   post stress left ventricle is normal in size, post stress ejection fraction is 67% global left ventricular systolic function is normal, normal myocardial perfusion study, abnormal myocardial perfusion study, low risk scan  . PACEMAKER IMPLANT N/A 07/16/2017   Procedure: PACEMAKER IMPLANT;  Surgeon: Thurmon Fair, MD;  Location: [facility] INVASIVE CV LAB;  Service: Cardiovascular;  Laterality: N/A;  . THYROIDECTOMY       Current Meds  Medication Sig  . acetaminophen (TYLENOL) 500 MG tablet Take 500-1,000 mg by mouth every 6 (six) hours as needed for moderate pain.  [name] amLODipine (NORVASC) 10 MG tablet TAKE 1 TABLET BY MOUTH EVERY DAY  . atorvastatin (LIPITOR) 80 MG tablet Take 80 mg by mouth daily.   . cholecalciferol (VITAMIN D3) 25 MCG (1000 UT) tablet Take 1,000 Units by mouth daily.  . diphenhydrAMINE (BENADRYL) 25 MG tablet Take 25 mg by mouth every 6 (six) hours as needed for allergies.  [name] ELIQUIS 5 MG TABS tablet TAKE 1 TABLET BY MOUTH TWICE A DAY  . furosemide (LASIX) 20 MG tablet Take 1 tablet (20 mg total) by mouth daily.  . hydroxypropyl methylcellulose / hypromellose (ISOPTO TEARS / GONIOVISC) 2.5 % ophthalmic solution Place 1 drop into both  eyes 3 (three) times daily as needed for dry eyes.  [name] levothyroxine (SYNTHROID, LEVOTHROID) 50 MCG tablet Take 50 mcg by mouth daily.  . traMADol (ULTRAM) 50 MG tablet Take 50 mg by mouth daily as needed.   . valsartan (DIOVAN) 160 MG tablet Take 160 mg by mouth daily.     Allergies:   Penicillins and Sulfa antibiotics   Social History   Tobacco Use  . Smoking status: Never Smoker  . Smokeless tobacco: Current User    Types: Snuff  Substance Use Topics  . Alcohol use: No  . Drug use: No     Family Hx: The patient's family history includes CAD in her maternal grandmother and mother.  ROS:   Please see the history of present illness.    All other systems reviewed and are negative.  Prior CV studies:   The following studies were reviewed today: Pacemaker download 04/15/2019  Labs/Other Tests and Data Reviewed:    EKG:  Intracardiac electrogram shows ventricular paced rhythm.  Recent Labs: 06/27/2018: ALT 22;  Hemoglobin 12.9; Platelets 197 02/04/2019: BUN 22; Creatinine, Ser 1.11; Potassium 4.8; Sodium 142   Recent Lipid Panel Lab Results  Component Value Date/Time   CHOL 127 11/25/2016 10:05 AM   TRIG 117 11/25/2016 10:05 AM   HDL 46 11/25/2016 10:05 AM   CHOLHDL 2.8 11/25/2016 10:05 AM   CHOLHDL 2.6 01/05/2015 11:18 AM   LDLCALC 58 11/25/2016 10:05 AM    Wt Readings from Last 3 Encounters:  04/29/19 147 lb (66.7 kg)  03/02/19 144 lb (65.3 kg)  10/21/18 143 lb (64.9 kg)     Objective:    Vital Signs:  BP 134/79   Pulse 65   Ht 5\' 3"  (1.6 m)   Wt 147 lb (66.7 kg)   BMI 26.04 kg/m    VITAL SIGNS:  reviewed Unable to examine  ASSESSMENT & PLAN:    1. CHF: Acknowledging the limitations of a phone visit, it sounds like she is euvolemic (weight stable at target range) and has NYHA functional class II.  She is quite active for a 86 year old.  Continue the same medications.  Commended her for her compliance to sodium restriction and daily weight  monitoring. 2. CAD: Asymptomatic.  No angina.  Only antianginal medication is amlodipine.  She had bypass surgery in 2006.  It has been almost 7 years since her last  cardiac cath, which did not show any meaningful stenoses.. 3. AFib: With slow ventricular response, 100% ventricular pacing even without any rate control medications.  On appropriate anticoagulation without bleeding complications.  At her last in office check she was not pacemaker dependent, underlying rate was in the 40s. 4. HTN: Excellent control based on her home readings.  Continue amlodipine, valsartan, spironolactone. 5. PPM: Normal function of her single-chamber permanent pacemaker implanted in May 2019.  Continue remote downloads every 3 months.  COVID-19 Education: The signs and symptoms of COVID-19 were discussed with the patient and how to seek care for testing (follow up with PCP or arrange E-visit).  The importance of social distancing was discussed today.  Time:   Today, I have spent 19 minutes with the patient with telehealth technology discussing the above problems.     Medication Adjustments/Labs and Tests Ordered: Current medicines are reviewed at length with the patient today.  Concerns regarding medicines are outlined above.   Tests Ordered: No orders of the defined types were placed in this encounter.   Medication Changes: No orders of the defined types were placed in this encounter.   Follow Up:  Either In Person or Virtual in 6-12  month(s).  Alternate visits with Dr. 8-12  Signed, Allyson Sabal, MD  04/29/2019 6:31 PM    [facility]

## 2019-05-10 ENCOUNTER — Telehealth: Payer: Self-pay | Admitting: Cardiovascular Disease

## 2019-05-10 NOTE — Telephone Encounter (Signed)
Returned the call to the patient. She stated that she forgot to mention it to Dr. Royann Shivers at her virtual visit. She wants to know if she can cut back on the diuretics because she is up 6-7 times all night.  She currently takes Furosemide 20 mg daily and Spironolactone 12.5 daily. She takes both of them in the mornings.   She currently denies any edema or shortness of breath.

## 2019-05-10 NOTE — Telephone Encounter (Signed)
Pt c/o medication issue:  1. Name of Medication:  furosemide (LASIX) 20 MG tablet spironolactone (ALDACTONE) 25 MG tablet  2. How are you currently taking this medication (dosage and times per day)? furosemide (LASIX) 20 MG tablet once daily (as directed) spironolactone (ALDACTONE) 25 MG tablet  0.5 tablet donce daily)   3. Are you having a reaction (difficulty breathing--STAT)? no  4. What is your medication issue? Pt feels she is urinating all the time. She just wanted to make sure she is taking her medication the right way. She forgot to ask during her phone visit last week

## 2019-05-10 NOTE — Telephone Encounter (Signed)
Please reduce furosemide to 4 days a week and continue daily spironolactone. We will try that for several weeks before cuttting back further.

## 2019-05-11 NOTE — Telephone Encounter (Signed)
Left a message for the patient to call back.  

## 2019-05-12 MED ORDER — FUROSEMIDE 20 MG PO TABS: 20.0000 mg | ORAL_TABLET | ORAL | 11 refills | Status: DC

## 2019-05-12 NOTE — Addendum Note (Signed)
Addended by: Sandi Mariscal on: 05/12/2019 09:43 AM   Modules accepted: Orders

## 2019-05-12 NOTE — Telephone Encounter (Signed)
Pt made aware of the following recommendations from Dr. Salena Saner    Please reduce furosemide to 4 days a week and continue daily spironolactone. We will try that for several weeks before cuttting back further.  She verbalized understanding and will call back with any new concerns

## 2019-05-12 NOTE — Telephone Encounter (Signed)
Follow Up:      Pt returning Lisa's call from yesterday.

## 2019-05-20 ENCOUNTER — Other Ambulatory Visit: Payer: Self-pay | Admitting: Cardiovascular Disease

## 2019-06-22 ENCOUNTER — Emergency Department (HOSPITAL_COMMUNITY): Payer: Medicare PPO

## 2019-06-22 ENCOUNTER — Encounter (HOSPITAL_COMMUNITY): Payer: Self-pay | Admitting: Radiology

## 2019-06-22 ENCOUNTER — Inpatient Hospital Stay (HOSPITAL_COMMUNITY)
Admission: EM | Admit: 2019-06-22 | Discharge: 2019-06-27 | DRG: 871 | Disposition: A | Discharge status: Home Health | Payer: Medicare PPO | Attending: Internal Medicine | Admitting: Internal Medicine

## 2019-06-22 DIAGNOSIS — Z951 Presence of aortocoronary bypass graft: Secondary | ICD-10-CM

## 2019-06-22 DIAGNOSIS — Z880 Allergy status to penicillin: Secondary | ICD-10-CM

## 2019-06-22 DIAGNOSIS — E7800 Pure hypercholesterolemia, unspecified: Secondary | ICD-10-CM | POA: Diagnosis present

## 2019-06-22 DIAGNOSIS — Z66 Do not resuscitate: Secondary | ICD-10-CM | POA: Diagnosis present

## 2019-06-22 DIAGNOSIS — N1832 Chronic kidney disease, stage 3b: Secondary | ICD-10-CM | POA: Diagnosis present

## 2019-06-22 DIAGNOSIS — I088 Other rheumatic multiple valve diseases: Secondary | ICD-10-CM | POA: Diagnosis present

## 2019-06-22 DIAGNOSIS — E039 Hypothyroidism, unspecified: Secondary | ICD-10-CM | POA: Diagnosis present

## 2019-06-22 DIAGNOSIS — R531 Weakness: Secondary | ICD-10-CM

## 2019-06-22 DIAGNOSIS — J159 Unspecified bacterial pneumonia: Secondary | ICD-10-CM | POA: Diagnosis present

## 2019-06-22 DIAGNOSIS — Z7901 Long term (current) use of anticoagulants: Secondary | ICD-10-CM

## 2019-06-22 DIAGNOSIS — Z8249 Family history of ischemic heart disease and other diseases of the circulatory system: Secondary | ICD-10-CM

## 2019-06-22 DIAGNOSIS — Z882 Allergy status to sulfonamides status: Secondary | ICD-10-CM

## 2019-06-22 DIAGNOSIS — E872 Acidosis: Secondary | ICD-10-CM | POA: Diagnosis present

## 2019-06-22 DIAGNOSIS — N390 Urinary tract infection, site not specified: Secondary | ICD-10-CM | POA: Diagnosis present

## 2019-06-22 DIAGNOSIS — R079 Chest pain, unspecified: Secondary | ICD-10-CM | POA: Diagnosis not present

## 2019-06-22 DIAGNOSIS — Z955 Presence of coronary angioplasty implant and graft: Secondary | ICD-10-CM

## 2019-06-22 DIAGNOSIS — Z79890 Hormone replacement therapy (postmenopausal): Secondary | ICD-10-CM

## 2019-06-22 DIAGNOSIS — Z96649 Presence of unspecified artificial hip joint: Secondary | ICD-10-CM | POA: Diagnosis present

## 2019-06-22 DIAGNOSIS — I2510 Atherosclerotic heart disease of native coronary artery without angina pectoris: Secondary | ICD-10-CM | POA: Diagnosis present

## 2019-06-22 DIAGNOSIS — I5033 Acute on chronic diastolic (congestive) heart failure: Secondary | ICD-10-CM | POA: Diagnosis present

## 2019-06-22 DIAGNOSIS — I130 Hypertensive heart and chronic kidney disease with heart failure and stage 1 through stage 4 chronic kidney disease, or unspecified chronic kidney disease: Secondary | ICD-10-CM | POA: Diagnosis present

## 2019-06-22 DIAGNOSIS — R0600 Dyspnea, unspecified: Secondary | ICD-10-CM

## 2019-06-22 DIAGNOSIS — A419 Sepsis, unspecified organism: Principal | ICD-10-CM | POA: Diagnosis present

## 2019-06-22 DIAGNOSIS — J9601 Acute respiratory failure with hypoxia: Secondary | ICD-10-CM | POA: Diagnosis present

## 2019-06-22 DIAGNOSIS — I10 Essential (primary) hypertension: Secondary | ICD-10-CM | POA: Diagnosis present

## 2019-06-22 DIAGNOSIS — I252 Old myocardial infarction: Secondary | ICD-10-CM

## 2019-06-22 DIAGNOSIS — J189 Pneumonia, unspecified organism: Secondary | ICD-10-CM

## 2019-06-22 DIAGNOSIS — N179 Acute kidney failure, unspecified: Secondary | ICD-10-CM

## 2019-06-22 DIAGNOSIS — Z79899 Other long term (current) drug therapy: Secondary | ICD-10-CM

## 2019-06-22 DIAGNOSIS — I4821 Permanent atrial fibrillation: Secondary | ICD-10-CM | POA: Diagnosis present

## 2019-06-22 DIAGNOSIS — Z950 Presence of cardiac pacemaker: Secondary | ICD-10-CM

## 2019-06-22 DIAGNOSIS — Z20822 Contact with and (suspected) exposure to covid-19: Secondary | ICD-10-CM | POA: Diagnosis present

## 2019-06-22 DIAGNOSIS — E785 Hyperlipidemia, unspecified: Secondary | ICD-10-CM | POA: Diagnosis present

## 2019-06-22 DIAGNOSIS — I5032 Chronic diastolic (congestive) heart failure: Secondary | ICD-10-CM | POA: Diagnosis present

## 2019-06-22 LAB — COMPREHENSIVE METABOLIC PANEL
ALT: 19 U/L (ref 0–44)
AST: 30 U/L (ref 15–41)
Albumin: 3.7 g/dL (ref 3.5–5.0)
Alkaline Phosphatase: 69 U/L (ref 38–126)
Anion gap: 10 (ref 5–15)
BUN: 25 mg/dL — ABNORMAL HIGH (ref 8–23)
CO2: 21 mmol/L — ABNORMAL LOW (ref 22–32)
Calcium: 10.2 mg/dL (ref 8.9–10.3)
Chloride: 107 mmol/L (ref 98–111)
Creatinine, Ser: 1.47 mg/dL — ABNORMAL HIGH (ref 0.44–1.00)
GFR calc Af Amer: 35 mL/min — ABNORMAL LOW (ref >60)
GFR calc non Af Amer: 30 mL/min — ABNORMAL LOW (ref >60)
Glucose, Bld: 109 mg/dL — ABNORMAL HIGH (ref 70–99)
Potassium: 4.5 mmol/L (ref 3.5–5.1)
Sodium: 138 mmol/L (ref 135–145)
Total Bilirubin: 1.5 mg/dL — ABNORMAL HIGH (ref 0.3–1.2)
Total Protein: 6.3 g/dL — ABNORMAL LOW (ref 6.5–8.1)

## 2019-06-22 LAB — CBC WITH DIFFERENTIAL/PLATELET
Abs Immature Granulocytes: 0.04 10*3/uL (ref 0.00–0.07)
Basophils Absolute: 0 10*3/uL (ref 0.0–0.1)
Basophils Relative: 0 %
Eosinophils Absolute: 0 10*3/uL (ref 0.0–0.5)
Eosinophils Relative: 0 %
HCT: 40.5 % (ref 36.0–46.0)
Hemoglobin: 13.1 g/dL (ref 12.0–15.0)
Immature Granulocytes: 0 %
Lymphocytes Relative: 2 %
Lymphs Abs: 0.2 10*3/uL — ABNORMAL LOW (ref 0.7–4.0)
MCH: 29.4 pg (ref 26.0–34.0)
MCHC: 32.3 g/dL (ref 30.0–36.0)
MCV: 90.8 fL (ref 80.0–100.0)
Monocytes Absolute: 0.4 10*3/uL (ref 0.1–1.0)
Monocytes Relative: 4 %
Neutro Abs: 9 10*3/uL — ABNORMAL HIGH (ref 1.7–7.7)
Neutrophils Relative %: 94 %
Platelets: 159 10*3/uL (ref 150–400)
RBC: 4.46 MIL/uL (ref 3.87–5.11)
RDW: 13.9 % (ref 11.5–15.5)
WBC: 9.7 10*3/uL (ref 4.0–10.5)
nRBC: 0 % (ref 0.0–0.2)

## 2019-06-22 LAB — TROPONIN I (HIGH SENSITIVITY)
Troponin I (High Sensitivity): 21 ng/L — ABNORMAL HIGH (ref <18)
Troponin I (High Sensitivity): 22 ng/L — ABNORMAL HIGH (ref <18)

## 2019-06-22 LAB — LACTIC ACID, PLASMA: Lactic Acid, Venous: 1.9 mmol/L (ref 0.5–1.9)

## 2019-06-22 LAB — D-DIMER, QUANTITATIVE: D-Dimer, Quant: 0.91 ug/mL-FEU — ABNORMAL HIGH (ref 0.00–0.50)

## 2019-06-22 LAB — BRAIN NATRIURETIC PEPTIDE: B Natriuretic Peptide: 171 pg/mL — ABNORMAL HIGH (ref 0.0–100.0)

## 2019-06-22 MED ORDER — IOHEXOL 350 MG/ML SOLN
75.0000 mL | Freq: Once | INTRAVENOUS | Status: AC | PRN
  Administered 2019-06-22: 75 mL via INTRAVENOUS

## 2019-06-22 MED ORDER — METRONIDAZOLE IN NACL 5-0.79 MG/ML-% IV SOLN
500.0000 mg | Freq: Once | INTRAVENOUS | Status: AC
  Administered 2019-06-22: 500 mg via INTRAVENOUS
  Filled 2019-06-22: qty 100

## 2019-06-22 MED ORDER — SODIUM CHLORIDE 0.9 % IV SOLN
2.0000 g | Freq: Once | INTRAVENOUS | Status: AC
  Administered 2019-06-22: 2 g via INTRAVENOUS
  Filled 2019-06-22: qty 2

## 2019-06-22 MED ORDER — VANCOMYCIN HCL IN DEXTROSE 1-5 GM/200ML-% IV SOLN
1000.0000 mg | Freq: Once | INTRAVENOUS | Status: AC
  Administered 2019-06-23: 1000 mg via INTRAVENOUS
  Filled 2019-06-22: qty 200

## 2019-06-22 MED ORDER — SODIUM CHLORIDE 0.9 % IV BOLUS
500.0000 mL | Freq: Once | INTRAVENOUS | Status: AC
  Administered 2019-06-22: 500 mL via INTRAVENOUS

## 2019-06-22 NOTE — ED Provider Notes (Signed)
[facility] EMERGENCY DEPARTMENT Provider Note   CSN: [id number] Arrival date & time: 06/22/19  1943     History Chief Complaint  Patient presents with  . Chest Pain  . Weakness    Stephanie Atkinson is a 86 y.o. female.  HPI Patient presents to the emergency department with chest discomfort and cough.  Patient states she is also been feeling weak over the last 24 hours.  The patient was seen by her doctor and placed on antibiotics for a UTI.    Past Medical History:  Diagnosis Date  . Chronic kidney disease, stage 3   . Coronary artery disease   . Hx of CABG 2006   LIMA-LAD, free RIMA-PDA, Radial-OM1-OM2  . Hyperlipidemia   . Hypertension   . Hypothyroid   . MI, old       . PAF (paroxysmal atrial fibrillation) (HCC)    was on amio, d/c'd due to prolonged PR interval, rate control  . Presence of permanent cardiac pacemaker 07/16/2017  . Presence of stent in right coronary artery 07/12   RIMA-PDA occluded, DES stent placed  . Syncope 07/12   bradycardic, beta blocker decreased, also felt to be dehydrated    Patient Active Problem List   Diagnosis Date Noted  . Synovitis of right knee 03/02/2019  . Chronic diastolic heart failure ([place]) 09/14/2018  . Pacemaker 07/16/2017  . Atrial fibrillation with slow ventricular response ([place]) 06/27/2017  . Hypercholesterolemia 06/27/2017  . Symptomatic bradycardia 06/27/2017  . Chronic pain of right knee 05/17/2016  . De Quervain's tenosynovitis 05/17/2016  . CAD (coronary artery disease) of artery bypass graft 02/13/2015  . Encounter for loop recorder check 02/01/2014  . Accelerated junctional rhythm 05/04/2013  . Dehydration 10/14/2012  . Syncope and collapse 10/13/2012  . History of first degree atrioventricular block 10/11/2012  . Long term (current) use of anticoagulants 10/06/2012  . AVB - beta blocker and Amiodarone stopped 07/10/2012  . Hypertension   . Hx of CABG X 4 3/06-    . Dyslipidemia   . RCA  DES placed 7/12- patent 8/14   . Permanent atrial fibrillation ([place])   . Chronic kidney disease, stage 3     Past Surgical History:  Procedure Laterality Date  . CARDIAC CATHETERIZATION  938101   dr. Shanon Brow harding, revealing a atretic bypass to her right with patent distal RCA stent  . CAROTID STENT  2012  . CORONARY ARTERY BYPASS GRAFT  2006   LIMA-LAD, free RIMA-PDA, Radial-OM1-OM2  . DOPPLER ECHOCARDIOGRAPHY  [id number]   mild asymmetric left ventricular hypertrophy, left ventricular systolic function is low normal, ejection fraction = 50-55%, the LA is moderate dilated, the RA is mildly dilated, no significant valvular disease  . INSERT / REPLACE / REMOVE PACEMAKER  07/16/2017  . JOINT REPLACEMENT     hip replacement  . LEFT HEART CATHETERIZATION WITH CORONARY/GRAFT ANGIOGRAM N/A 10/12/2012   Procedure: LEFT HEART CATHETERIZATION WITH Beatrix Fetters;  Surgeon: Leonie Man, MD;  Location[facility] CATH LAB;  Service: Cardiovascular;  Laterality: N/A;  . LOOP RECORDER IMPLANT N/A 10/15/2012   Procedure: LOOP RECORDER IMPLANT;  Surgeon: Sanda Klein, MD;  Location: [facility] CATH LAB;  Service: Cardiovascular;  Laterality: N/A;  . LOOP RECORDER REMOVAL  07/16/2017  . LOOP RECORDER REMOVAL N/A 07/16/2017   Procedure: LOOP RECORDER REMOVAL;  Surgeon: Sanda Klein, MD;  Location: [facility] CV LAB;  Service: Cardiovascular;  Laterality: N/A;  . NM [place]  [id number]   post  stress left ventricle is normal in size, post stress ejection fraction is 67% global left ventricular systolic function is normal, normal myocardial perfusion study, abnormal myocardial perfusion study, low risk scan  . PACEMAKER IMPLANT N/A 07/16/2017   Procedure: PACEMAKER IMPLANT;  Surgeon: Thurmon Fair, MD;  Location: [facility] INVASIVE CV LAB;  Service: Cardiovascular;  Laterality: N/A;  . THYROIDECTOMY       OB History   No obstetric history on file.     Family History  Problem Relation Age of Onset  . CAD  Mother   . CAD Maternal Grandmother     Social History   Tobacco Use  . Smoking status: Never Smoker  . Smokeless tobacco: Current User    Types: Snuff  Substance Use Topics  . Alcohol use: No  . Drug use: No    Home Medications Prior to Admission medications   Medication Sig Start Date End Date Taking? Authorizing Provider  acetaminophen (TYLENOL) 500 MG tablet Take 500-1,000 mg by mouth every 6 (six) hours as needed for moderate pain.   Yes [provider]  amLODipine (NORVASC) 10 MG tablet TAKE 1 TABLET BY MOUTH EVERY DAY Patient taking differently: Take 10 mg by mouth daily.  11/12/18  Yes Croitoru, Mihai, MD  atorvastatin (LIPITOR) 80 MG tablet Take 80 mg by mouth at bedtime.    Yes [provider]  Calcium-Vit D-Arg-Inos-Silicon (BONE DENSITY) 300-200 MG-UNIT TABS Take 1 tablet by mouth daily.   Yes [provider]  cholecalciferol (VITAMIN D3) 25 MCG (1000 UT) tablet Take 1,000 Units by mouth daily.   Yes [provider]  diphenhydrAMINE (BENADRYL) 25 MG tablet Take 25 mg by mouth every 6 (six) hours as needed for allergies.   Yes [provider]  ELIQUIS 5 MG TABS tablet TAKE 1 TABLET BY MOUTH TWICE A DAY Patient taking differently: Take 5 mg by mouth 2 (two) times daily.  05/24/19  Yes Runell Gess, MD  furosemide (LASIX) 20 MG tablet Take 1 tablet (20 mg total) by mouth 4 (four) times a week. 05/13/19  Yes Croitoru, Mihai, MD  hydroxypropyl methylcellulose / hypromellose (ISOPTO TEARS / GONIOVISC) 2.5 % ophthalmic solution Place 1 drop into both eyes 3 (three) times daily as needed for dry eyes.   Yes [provider]  levothyroxine (SYNTHROID, LEVOTHROID) 50 MCG tablet Take 50 mcg by mouth daily before breakfast.    Yes [provider]  lidocaine (LIDODERM) 5 % Place 1 patch onto the skin daily. Remove & Discard patch within 12 hours or as directed by MD Patient taking differently: Place 1 patch onto the skin  daily as needed (for pain and Remove & Discard patch within 12 hours or as directed by MD).  06/27/18  Yes Street, [place], PA-C  nitrofurantoin (MACRODANTIN) 100 MG capsule Take 100 mg by mouth 2 (two) times daily. FOR 7 DAYS 06/22/19 06/29/19 Yes [provider]  nitroGLYCERIN (NITROSTAT) 0.4 MG SL tablet Place 0.4 mg under the tongue every 5 (five) minutes as needed for chest pain.    Yes [provider]  spironolactone (ALDACTONE) 25 MG tablet Take 0.5 tablets (12.5 mg total) by mouth daily. Please note dosage change. Patient will call when she needs a refill Patient taking differently: Take 12.5 mg by mouth daily.  12/25/18 06/22/19 Yes Croitoru, Mihai, MD  traMADol (ULTRAM) 50 MG tablet Take 50 mg by mouth every 6 (six) hours as needed (for pain).    Yes [provider]  valsartan (  DIOVAN) 160 MG tablet Take 160 mg by mouth daily. 03/06/18  Yes [provider]  clobetasol cream (TEMOVATE) 0.05 % Apply 1 application topically 3 (three) times daily as needed (to affected areas of legs).  07/12/16   [provider]  methocarbamol (ROBAXIN) 500 MG tablet Take 1 tablet (500 mg total) by mouth 2 (two) times daily. Patient not taking: Reported on 06/22/2019 07/10/17   Palumbo, April, MD    Allergies    Penicillins and Sulfa antibiotics  Review of Systems   Review of Systems All other systems negative except as documented in the HPI. All pertinent positives and negatives as reviewed in the HPI. Physical Exam Updated Vital Signs BP (!) 148/60 (BP Location: Right Arm)   Pulse 80   Temp (!) 102.1 F (38.9 C) (Rectal)   Resp (!) 27   SpO2 95%   Physical Exam Vitals and nursing note reviewed.  Constitutional:      General: She is not in acute distress.    Appearance: She is well-developed.  HENT:     Head: Normocephalic and atraumatic.  Eyes:     Pupils: Pupils are equal, round, and reactive to light.  Cardiovascular:     Rate and Rhythm: Normal rate  and regular rhythm.     Heart sounds: Normal heart sounds. No murmur. No friction rub. No gallop.   Pulmonary:     Effort: Pulmonary effort is normal. No respiratory distress.     Breath sounds: Normal breath sounds. No decreased breath sounds, wheezing or rhonchi.  Abdominal:     General: Bowel sounds are normal. There is no distension.     Palpations: Abdomen is soft.     Tenderness: There is no abdominal tenderness.  Musculoskeletal:     Cervical back: Normal range of motion and neck supple.  Skin:    General: Skin is warm and dry.     Capillary Refill: Capillary refill takes less than 2 seconds.     Findings: No erythema or rash.  Neurological:     Mental Status: She is alert and oriented to person, place, and time.     Motor: No abnormal muscle tone.     Coordination: Coordination normal.  Psychiatric:        Behavior: Behavior normal.     ED Results / Procedures / Treatments   Labs (all labs ordered are listed, but only abnormal results are displayed) Labs Reviewed  COMPREHENSIVE METABOLIC PANEL - Abnormal; Notable for the following components:      Result Value   CO2 21 (*)    Glucose, Bld 109 (*)    BUN 25 (*)    Creatinine, Ser 1.47 (*)    Total Protein 6.3 (*)    Total Bilirubin 1.5 (*)    GFR calc non Af Amer 30 (*)    GFR calc Af Amer 35 (*)    All other components within normal limits  CBC WITH DIFFERENTIAL/PLATELET - Abnormal; Notable for the following components:   Neutro Abs 9.0 (*)    Lymphs Abs 0.2 (*)    All other components within normal limits  BRAIN NATRIURETIC PEPTIDE - Abnormal; Notable for the following components:   B Natriuretic Peptide 171.0 (*)    All other components within normal limits  TROPONIN I (HIGH SENSITIVITY) - Abnormal; Notable for the following components:   Troponin I (High Sensitivity) 21 (*)    All other components within normal limits  CULTURE, BLOOD (ROUTINE X 2)  CULTURE,  BLOOD (ROUTINE X 2)  URINE CULTURE  RESPIRATORY  PANEL BY RT PCR (FLU A&B, COVID)  URINALYSIS, ROUTINE W REFLEX MICROSCOPIC  D-DIMER, QUANTITATIVE (NOT AT [facility])  LACTIC ACID, PLASMA  LACTIC ACID, PLASMA  TROPONIN I (HIGH SENSITIVITY)    EKG EKG Interpretation  Date/Time:  Tuesday June 22 2019 19:59:25 EDT Ventricular Rate:  74 PR Interval:    QRS Duration: 99 QT Interval:  406 QTC Calculation: 448 R Axis:   83 Text Interpretation: Sinus or ectopic atrial rhythm Paired ventricular premature complexes Short PR interval Borderline right axis deviation Probable LVH with secondary repol abnrm multiple complexes compared with prior 5/20 Confirmed by Meridee Score [phone]) on 06/22/2019 8:03:44 PM   Radiology DG Chest 2 View  Result Date: 06/22/2019 CLINICAL DATA:  Chest pain EXAM: CHEST - 2 VIEW COMPARISON:  04/17/2018 FINDINGS: Moderate cardiomegaly. Unchanged position of left chest wall pacemaker. Remote median sternotomy and CABG. There is bibasilar atelectasis. Mild interstitial opacity without overt pulmonary edema. Pulmonary vascular congestion. No sizable pleural effusion. IMPRESSION: Cardiomegaly and pulmonary vascular congestion without overt pulmonary edema. Electronically Signed   By: Deatra Robinson M.D.   On: 06/22/2019 21:18    Procedures Procedures (including critical care time)  Medications Ordered in ED Medications - No data to display  ED Course  I have reviewed the triage vital signs and the nursing notes.  Pertinent labs & imaging results that were available during my care of the patient were reviewed by me and considered in my medical decision making (see chart for details).  Clinical Course as of Jun 22 2251  Tue Jun 22, 2019  2545 86 year old female here with acute onset of coughing feeling sore all over and some weakness.  Chest x-ray showing some signs of fluid overload.  Getting labs BNP serial troponins.  Has a low-grade temperature here.  Normal white count.   [MB]    Clinical Course User Index [MB]  Terrilee Files, MD   MDM Rules/Calculators/A&P                     Patient will need admission for further work-up of her fever and low oxygen saturations.  The patient had an oxygen saturation on room air in the upper 80s 86-89.  Place was placed on 2 L of oxygen is now saturating in the 96% range.  Has not had a cough here but complains of cough at home.  Covid testing will be performed.  Chest x-ray does not show any signs of pneumonia.  The patient was treated for recent UTI.  She took 1 dose of the medication this morning.  And that is all she has had.  Patient does have some fluid overload noted on examination with swelling in her lower extremities.  I feel at this point that we are not able to get the patient IV fluids at the 30/kg rate. Final Clinical Impression(s) / ED Diagnoses Final diagnoses:  None    Rx / DC Orders ED Discharge Orders    None       Charlestine Night, PA-C 06/22/19 2314    Terrilee Files, MD 06/23/19 1038

## 2019-06-23 ENCOUNTER — Other Ambulatory Visit: Payer: Self-pay

## 2019-06-23 DIAGNOSIS — E7800 Pure hypercholesterolemia, unspecified: Secondary | ICD-10-CM

## 2019-06-23 DIAGNOSIS — N1832 Chronic kidney disease, stage 3b: Secondary | ICD-10-CM | POA: Diagnosis present

## 2019-06-23 DIAGNOSIS — E039 Hypothyroidism, unspecified: Secondary | ICD-10-CM | POA: Diagnosis present

## 2019-06-23 DIAGNOSIS — I130 Hypertensive heart and chronic kidney disease with heart failure and stage 1 through stage 4 chronic kidney disease, or unspecified chronic kidney disease: Secondary | ICD-10-CM | POA: Diagnosis present

## 2019-06-23 DIAGNOSIS — I5032 Chronic diastolic (congestive) heart failure: Secondary | ICD-10-CM | POA: Diagnosis not present

## 2019-06-23 DIAGNOSIS — Z66 Do not resuscitate: Secondary | ICD-10-CM | POA: Diagnosis present

## 2019-06-23 DIAGNOSIS — I10 Essential (primary) hypertension: Secondary | ICD-10-CM

## 2019-06-23 DIAGNOSIS — Z882 Allergy status to sulfonamides status: Secondary | ICD-10-CM | POA: Diagnosis not present

## 2019-06-23 DIAGNOSIS — N179 Acute kidney failure, unspecified: Secondary | ICD-10-CM | POA: Diagnosis present

## 2019-06-23 DIAGNOSIS — I252 Old myocardial infarction: Secondary | ICD-10-CM | POA: Diagnosis not present

## 2019-06-23 DIAGNOSIS — Z955 Presence of coronary angioplasty implant and graft: Secondary | ICD-10-CM | POA: Diagnosis not present

## 2019-06-23 DIAGNOSIS — Z880 Allergy status to penicillin: Secondary | ICD-10-CM | POA: Diagnosis not present

## 2019-06-23 DIAGNOSIS — Z951 Presence of aortocoronary bypass graft: Secondary | ICD-10-CM | POA: Diagnosis not present

## 2019-06-23 DIAGNOSIS — I088 Other rheumatic multiple valve diseases: Secondary | ICD-10-CM | POA: Diagnosis present

## 2019-06-23 DIAGNOSIS — Z20822 Contact with and (suspected) exposure to covid-19: Secondary | ICD-10-CM | POA: Diagnosis present

## 2019-06-23 DIAGNOSIS — Z8249 Family history of ischemic heart disease and other diseases of the circulatory system: Secondary | ICD-10-CM | POA: Diagnosis not present

## 2019-06-23 DIAGNOSIS — N189 Chronic kidney disease, unspecified: Secondary | ICD-10-CM

## 2019-06-23 DIAGNOSIS — R079 Chest pain, unspecified: Secondary | ICD-10-CM | POA: Diagnosis present

## 2019-06-23 DIAGNOSIS — J189 Pneumonia, unspecified organism: Secondary | ICD-10-CM | POA: Diagnosis not present

## 2019-06-23 DIAGNOSIS — J159 Unspecified bacterial pneumonia: Secondary | ICD-10-CM

## 2019-06-23 DIAGNOSIS — Z950 Presence of cardiac pacemaker: Secondary | ICD-10-CM | POA: Diagnosis not present

## 2019-06-23 DIAGNOSIS — I361 Nonrheumatic tricuspid (valve) insufficiency: Secondary | ICD-10-CM | POA: Diagnosis not present

## 2019-06-23 DIAGNOSIS — A419 Sepsis, unspecified organism: Secondary | ICD-10-CM | POA: Diagnosis present

## 2019-06-23 DIAGNOSIS — I4821 Permanent atrial fibrillation: Secondary | ICD-10-CM | POA: Diagnosis present

## 2019-06-23 DIAGNOSIS — Z96649 Presence of unspecified artificial hip joint: Secondary | ICD-10-CM | POA: Diagnosis present

## 2019-06-23 DIAGNOSIS — E872 Acidosis: Secondary | ICD-10-CM | POA: Diagnosis present

## 2019-06-23 DIAGNOSIS — J9601 Acute respiratory failure with hypoxia: Secondary | ICD-10-CM | POA: Diagnosis present

## 2019-06-23 DIAGNOSIS — I5033 Acute on chronic diastolic (congestive) heart failure: Secondary | ICD-10-CM | POA: Diagnosis present

## 2019-06-23 DIAGNOSIS — I2510 Atherosclerotic heart disease of native coronary artery without angina pectoris: Secondary | ICD-10-CM | POA: Diagnosis present

## 2019-06-23 DIAGNOSIS — E785 Hyperlipidemia, unspecified: Secondary | ICD-10-CM | POA: Diagnosis present

## 2019-06-23 DIAGNOSIS — I340 Nonrheumatic mitral (valve) insufficiency: Secondary | ICD-10-CM | POA: Diagnosis not present

## 2019-06-23 DIAGNOSIS — N390 Urinary tract infection, site not specified: Secondary | ICD-10-CM | POA: Diagnosis present

## 2019-06-23 LAB — BASIC METABOLIC PANEL
Anion gap: 17 — ABNORMAL HIGH (ref 5–15)
BUN: 22 mg/dL (ref 8–23)
CO2: 13 mmol/L — ABNORMAL LOW (ref 22–32)
Calcium: 9.5 mg/dL (ref 8.9–10.3)
Chloride: 109 mmol/L (ref 98–111)
Creatinine, Ser: 1.43 mg/dL — ABNORMAL HIGH (ref 0.44–1.00)
GFR calc Af Amer: 36 mL/min — ABNORMAL LOW (ref >60)
GFR calc non Af Amer: 31 mL/min — ABNORMAL LOW (ref >60)
Glucose, Bld: 117 mg/dL — ABNORMAL HIGH (ref 70–99)
Potassium: 4 mmol/L (ref 3.5–5.1)
Sodium: 139 mmol/L (ref 135–145)

## 2019-06-23 LAB — PROTIME-INR
INR: 1.5 — ABNORMAL HIGH (ref 0.8–1.2)
Prothrombin Time: 17.6 seconds — ABNORMAL HIGH (ref 11.4–15.2)

## 2019-06-23 LAB — CBC
HCT: 37.4 % (ref 36.0–46.0)
Hemoglobin: 11.6 g/dL — ABNORMAL LOW (ref 12.0–15.0)
MCH: 28.2 pg (ref 26.0–34.0)
MCHC: 31 g/dL (ref 30.0–36.0)
MCV: 91 fL (ref 80.0–100.0)
Platelets: 161 10*3/uL (ref 150–400)
RBC: 4.11 MIL/uL (ref 3.87–5.11)
RDW: 13.8 % (ref 11.5–15.5)
WBC: 13.8 10*3/uL — ABNORMAL HIGH (ref 4.0–10.5)
nRBC: 0 % (ref 0.0–0.2)

## 2019-06-23 LAB — LACTIC ACID, PLASMA: Lactic Acid, Venous: 1.6 mmol/L (ref 0.5–1.9)

## 2019-06-23 LAB — URINE CULTURE: Culture: NO GROWTH

## 2019-06-23 LAB — STREP PNEUMONIAE URINARY ANTIGEN: Strep Pneumo Urinary Antigen: NEGATIVE

## 2019-06-23 LAB — URINALYSIS, ROUTINE W REFLEX MICROSCOPIC
Bilirubin Urine: NEGATIVE
Glucose, UA: NEGATIVE mg/dL
Hgb urine dipstick: NEGATIVE
Ketones, ur: NEGATIVE mg/dL
Leukocytes,Ua: NEGATIVE
Nitrite: NEGATIVE
Protein, ur: NEGATIVE mg/dL
Specific Gravity, Urine: 1.019 (ref 1.005–1.030)
pH: 5 (ref 5.0–8.0)

## 2019-06-23 LAB — RESPIRATORY PANEL BY RT PCR (FLU A&B, COVID)
Influenza A by PCR: NEGATIVE
Influenza B by PCR: NEGATIVE
SARS Coronavirus 2 by RT PCR: NEGATIVE

## 2019-06-23 LAB — HIV ANTIBODY (ROUTINE TESTING W REFLEX): HIV Screen 4th Generation wRfx: NONREACTIVE

## 2019-06-23 LAB — T4, FREE: Free T4: 1.01 ng/dL (ref 0.61–1.12)

## 2019-06-23 LAB — TSH: TSH: 0.835 u[IU]/mL (ref 0.350–4.500)

## 2019-06-23 MED ORDER — SODIUM CHLORIDE 0.9 % IV SOLN
500.0000 mg | Freq: Every day | INTRAVENOUS | Status: DC
  Administered 2019-06-23 – 2019-06-26 (×5): 500 mg via INTRAVENOUS
  Filled 2019-06-23 (×6): qty 500

## 2019-06-23 MED ORDER — APIXABAN 5 MG PO TABS
5.0000 mg | ORAL_TABLET | Freq: Two times a day (BID) | ORAL | Status: DC
  Administered 2019-06-23 – 2019-06-27 (×10): 5 mg via ORAL
  Filled 2019-06-23 (×11): qty 1

## 2019-06-23 MED ORDER — SPIRONOLACTONE 12.5 MG HALF TABLET
12.5000 mg | ORAL_TABLET | Freq: Every day | ORAL | Status: DC
  Administered 2019-06-23: 12.5 mg via ORAL
  Filled 2019-06-23: qty 1

## 2019-06-23 MED ORDER — AMLODIPINE BESYLATE 10 MG PO TABS
10.0000 mg | ORAL_TABLET | Freq: Every day | ORAL | Status: DC
  Administered 2019-06-23 – 2019-06-27 (×5): 10 mg via ORAL
  Filled 2019-06-23 (×3): qty 1
  Filled 2019-06-23: qty 2
  Filled 2019-06-23: qty 1

## 2019-06-23 MED ORDER — ACETAMINOPHEN 325 MG PO TABS
650.0000 mg | ORAL_TABLET | Freq: Four times a day (QID) | ORAL | Status: DC | PRN
  Filled 2019-06-23: qty 2

## 2019-06-23 MED ORDER — SODIUM CHLORIDE 0.9 % IV SOLN: INTRAVENOUS | Status: AC

## 2019-06-23 MED ORDER — SODIUM CHLORIDE 0.9 % IV SOLN
1.0000 g | Freq: Every day | INTRAVENOUS | Status: DC
  Administered 2019-06-23 – 2019-06-26 (×4): 1 g via INTRAVENOUS
  Filled 2019-06-23 (×4): qty 10

## 2019-06-23 MED ORDER — ATORVASTATIN CALCIUM 80 MG PO TABS
80.0000 mg | ORAL_TABLET | Freq: Every day | ORAL | Status: DC
  Administered 2019-06-23 – 2019-06-26 (×5): 80 mg via ORAL
  Filled 2019-06-23 (×5): qty 1

## 2019-06-23 MED ORDER — LEVOTHYROXINE SODIUM 50 MCG PO TABS
50.0000 ug | ORAL_TABLET | Freq: Every day | ORAL | Status: DC
  Administered 2019-06-23 – 2019-06-27 (×5): 50 ug via ORAL
  Filled 2019-06-23 (×5): qty 1

## 2019-06-23 MED ORDER — TRAMADOL HCL 50 MG PO TABS
50.0000 mg | ORAL_TABLET | Freq: Four times a day (QID) | ORAL | Status: DC | PRN
  Administered 2019-06-23 – 2019-06-27 (×9): 50 mg via ORAL
  Filled 2019-06-23 (×9): qty 1

## 2019-06-23 NOTE — ED Notes (Signed)
Lunch Tray Ordered @ 1038.  

## 2019-06-23 NOTE — ED Notes (Signed)
Attempted report 

## 2019-06-23 NOTE — H&P (Signed)
History and Physical    KIMBERLYN QUIOCHO SWN:[id number] DOB: September 15, 1924 DOA: 06/22/2019  PCP: Pearson Grippe, MD  Patient coming from: Home, lives alone but children lives a few doors down  I have personally briefly reviewed patient's old medical records in [facility]  Chief Complaint: cough, chest pain  HPI: DAYSI BOGGAN is a 86 y.o. female with medical history significant for Hx of HTN, permanent atrial fibrillation on Eliquis, CAD s/p CABG, hx of syncope with pacemaker, chronic diastolic heart failure, HLD who presents with acute onset of cough and chest pain.  Patient says that she went to the laundromat in her building today and then went home to have a meal.  She then suddenly developed an acute cough productive of sputum.  States the cough was so violent that it gave her midsternal chest pain as well as abdominal pain.  She also vomited some clear sputum.  Denies any diarrhea.  Denies feeling short of breath.  No worsening lower extremity edema.  Denies any fever but felt chills.  Denies any sick contact.  She finished her 2 doses of Moderna Covid vaccine back in February.  Yesterday she had a checkup with her PCP and was diagnosed with UTI and started on antibiotics.  She denies any dysuria or increased urgency or frequency although she is on a diuretic and has to go often.  States that she only noticed a change in smell and darker orange color.  In the ED, Temp of 102.1, normotensive on room air.  WBC of 9.7, hemoglobin of 13.1. Na of 138, K of 4.5, glucose of 109, creatinine of 1.54 from a prior of 1.11, total bilirubin 1.5. BNP of 171. Troponin of 21 and then 22. CTA chest shows bilateral airspace opacity at both lung base   Review of Systems: Constitutional: No Weight Change, No Fever ENT/Mouth: No sore throat, No Rhinorrhea Eyes: No Eye Pain, No Vision Changes Cardiovascular: No Chest Pain, no SOB,  No Edema Respiratory: +Cough, + Sputum Gastrointestinal: No Nausea, +  Vomiting, No Diarrhea, No Constipation, No Pain Genitourinary: , No Urgency, No Flank Pain Musculoskeletal: No Arthralgias, No Myalgias Skin: No Skin Lesions, No Pruritus, Neuro: no Weakness, No Numbness Psych: No Anxiety/Panic, No Depression, no decrease appetite Heme/Lymph: No Bruising, No Bleeding  Past Medical History:  Diagnosis Date  . Chronic kidney disease, stage 3   . Coronary artery disease   . Hx of CABG 2006   LIMA-LAD, free RIMA-PDA, Radial-OM1-OM2  . Hyperlipidemia   . Hypertension   . Hypothyroid   . MI, old       . PAF (paroxysmal atrial fibrillation) (HCC)    was on amio, d/c'd due to prolonged PR interval, rate control  . Presence of permanent cardiac pacemaker 07/16/2017  . Presence of stent in right coronary artery 07/12   RIMA-PDA occluded, DES stent placed  . Syncope 07/12   bradycardic, beta blocker decreased, also felt to be dehydrated    Past Surgical History:  Procedure Laterality Date  . CARDIAC CATHETERIZATION  [id number]   dr. Onalee Hua harding, revealing a atretic bypass to her right with patent distal RCA stent  . CAROTID STENT  2012  . CORONARY ARTERY BYPASS GRAFT  2006   LIMA-LAD, free RIMA-PDA, Radial-OM1-OM2  . DOPPLER ECHOCARDIOGRAPHY  [id number]   mild asymmetric left ventricular hypertrophy, left ventricular systolic function is low normal, ejection fraction = 50-55%, the LA is moderate dilated, the RA is mildly dilated, no significant valvular disease  .  INSERT / REPLACE / REMOVE PACEMAKER  07/16/2017  . JOINT REPLACEMENT     hip replacement  . LEFT HEART CATHETERIZATION WITH CORONARY/GRAFT ANGIOGRAM N/A 10/12/2012   Procedure: LEFT HEART CATHETERIZATION WITH Isabel Caprice;  Surgeon: Marykay Lex, MD;  Location: [facility] CATH LAB;  Service: Cardiovascular;  Laterality: N/A;  . LOOP RECORDER IMPLANT N/A 10/15/2012   Procedure: LOOP RECORDER IMPLANT;  Surgeon: Thurmon Fair, MD;  Location: [facility] CATH LAB;  Service: Cardiovascular;  Laterality:  N/A;  . LOOP RECORDER REMOVAL  07/16/2017  . LOOP RECORDER REMOVAL N/A 07/16/2017   Procedure: LOOP RECORDER REMOVAL;  Surgeon: Thurmon Fair, MD;  Location: [facility] INVASIVE CV LAB;  Service: Cardiovascular;  Laterality: N/A;  . NM MYOVIEW LTD  [id number]   post stress left ventricle is normal in size, post stress ejection fraction is 67% global left ventricular systolic function is normal, normal myocardial perfusion study, abnormal myocardial perfusion study, low risk scan  . PACEMAKER IMPLANT N/A 07/16/2017   Procedure: PACEMAKER IMPLANT;  Surgeon: Thurmon Fair, MD;  Location: [facility] INVASIVE CV LAB;  Service: Cardiovascular;  Laterality: N/A;  . THYROIDECTOMY       reports that she has never smoked. Her smokeless tobacco use includes snuff. She reports that she does not drink alcohol or use drugs.  Allergies  Allergen Reactions  . Penicillins Hives    Did it involve swelling of the face/tongue/throat, SOB, or low BP? No Did it involve sudden or severe rash/hives, skin peeling, or any reaction on the inside of your mouth or nose? No Did you need to seek medical attention at a hospital or doctor's office? No When did it last happen? Unk If all above answers are "NO", may proceed with cephalosporin use.   . Sulfa Antibiotics Hives and Swelling    Family History  Problem Relation Age of Onset  . CAD Mother   . CAD Maternal Grandmother      Prior to Admission medications   Medication Sig Start Date End Date Taking? Authorizing Provider  acetaminophen (TYLENOL) 500 MG tablet Take 500-1,000 mg by mouth every 6 (six) hours as needed for moderate pain.   Yes [provider]  amLODipine (NORVASC) 10 MG tablet TAKE 1 TABLET BY MOUTH EVERY DAY Patient taking differently: Take 10 mg by mouth daily.  11/12/18  Yes Croitoru, Mihai, MD  atorvastatin (LIPITOR) 80 MG tablet Take 80 mg by mouth at bedtime.    Yes [provider]  Calcium-Vit D-Arg-Inos-Silicon (BONE DENSITY) 300-200  MG-UNIT TABS Take 1 tablet by mouth daily.   Yes [provider]  cholecalciferol (VITAMIN D3) 25 MCG (1000 UT) tablet Take 1,000 Units by mouth daily.   Yes [provider]  diphenhydrAMINE (BENADRYL) 25 MG tablet Take 25 mg by mouth every 6 (six) hours as needed for allergies.   Yes [provider]  ELIQUIS 5 MG TABS tablet TAKE 1 TABLET BY MOUTH TWICE A DAY Patient taking differently: Take 5 mg by mouth 2 (two) times daily.  05/24/19  Yes Runell Gess, MD  furosemide (LASIX) 20 MG tablet Take 1 tablet (20 mg total) by mouth 4 (four) times a week. 05/13/19  Yes Croitoru, Mihai, MD  hydroxypropyl methylcellulose / hypromellose (ISOPTO TEARS / GONIOVISC) 2.5 % ophthalmic solution Place 1 drop into both eyes 3 (three) times daily as needed for dry eyes.   Yes [provider]  levothyroxine (SYNTHROID, LEVOTHROID) 50 MCG tablet Take 50 mcg by mouth daily before breakfast.  Yes [provider]  lidocaine (LIDODERM) 5 % Place 1 patch onto the skin daily. Remove & Discard patch within 12 hours or as directed by MD Patient taking differently: Place 1 patch onto the skin daily as needed (for pain and Remove & Discard patch within 12 hours or as directed by MD).  06/27/18  Yes Street, [place], PA-C  nitrofurantoin (MACRODANTIN) 100 MG capsule Take 100 mg by mouth 2 (two) times daily. FOR 7 DAYS 06/22/19 06/29/19 Yes [provider]  nitroGLYCERIN (NITROSTAT) 0.4 MG SL tablet Place 0.4 mg under the tongue every 5 (five) minutes as needed for chest pain.    Yes [provider]  spironolactone (ALDACTONE) 25 MG tablet Take 0.5 tablets (12.5 mg total) by mouth daily. Please note dosage change. Patient will call when she needs a refill Patient taking differently: Take 12.5 mg by mouth daily.  12/25/18 06/22/19 Yes Croitoru, Mihai, MD  traMADol (ULTRAM) 50 MG tablet Take 50 mg by mouth every 6 (six) hours as needed (for pain).    Yes [provider]  valsartan (DIOVAN) 160 MG tablet Take 160 mg by mouth daily. 03/06/18  Yes [provider]  clobetasol cream (TEMOVATE) 0.05 % Apply 1 application topically 3 (three) times daily as needed (to affected areas of legs).  07/12/16   [provider]  methocarbamol (ROBAXIN) 500 MG tablet Take 1 tablet (500 mg total) by mouth 2 (two) times daily. Patient not taking: Reported on 06/22/2019 07/10/17   Cy Blamer, MD    Physical Exam: Vitals:   06/22/19 2235 06/22/19 2315 06/22/19 2345 06/22/19 2351  BP:  (!) 139/47 (!) 139/44   Pulse:  62 69   Resp:  (!) 28 (!) 26   Temp: (!) 102.1 F (38.9 C)     TempSrc: Rectal     SpO2:  97% 94%   Weight:    74.4 kg  Height:    5\' 3"  (1.6 m)    Constitutional: NAD, calm, comfortable, non-toxic appearing elderly female laying flat in bed Vitals:   06/22/19 2235 06/22/19 2315 06/22/19 2345 06/22/19 2351  BP:  (!) 139/47 (!) 139/44   Pulse:  62 69   Resp:  (!) 28 (!) 26   Temp: (!) 102.1 F (38.9 C)     TempSrc: Rectal     SpO2:  97% 94%   Weight:    74.4 kg  Height:    5\' 3"  (1.6 m)   Eyes: PERRL, lids and conjunctivae normal ENMT: Mucous membranes are moist.  Neck: normal, supple Respiratory: bibasilar crackles. Normal respiratory effort on 2L . No accessory muscle use.  Cardiovascular: Regular rate and rhythm, no murmurs / rubs / gallops. No extremity edema. 2+ pedal pulses.  Abdomen: no tenderness, no masses palpated.  Bowel sounds positive.  Musculoskeletal: no clubbing / cyanosis. No joint deformity upper and lower extremities. Good ROM, no contractures. Normal muscle tone.  Skin: no rashes, lesions, ulcers. No induration Neurologic: CN 2-12 grossly intact. Sensation intact. Strength 5/5 in all 4.  Psychiatric: Normal judgment and insight. Alert and oriented x 3. Normal mood.     Labs on Admission: I have personally reviewed following labs and imaging studies  CBC: Recent Labs  Lab 06/22/19 2107   WBC 9.7  NEUTROABS 9.0*  HGB 13.1  HCT 40.5  MCV 90.8  PLT 159   Basic Metabolic Panel: Recent Labs  Lab 06/22/19 2107  NA 138  K 4.5  CL 107  CO2  21*  GLUCOSE 109*  BUN 25*  CREATININE 1.47*  CALCIUM 10.2   GFR: Estimated Creatinine Clearance: 23.1 mL/min (A) (by C-G formula based on SCr of 1.47 mg/dL (H)). Liver Function Tests: Recent Labs  Lab 06/22/19 2107  AST 30  ALT 19  ALKPHOS 69  BILITOT 1.5*  PROT 6.3*  ALBUMIN 3.7   No results for input(s): LIPASE, AMYLASE in the last 168 hours. No results for input(s): AMMONIA in the last 168 hours. Coagulation Profile: No results for input(s): INR, PROTIME in the last 168 hours. Cardiac Enzymes: No results for input(s): CKTOTAL, CKMB, CKMBINDEX, TROPONINI in the last 168 hours. BNP (last 3 results) No results for input(s): PROBNP in the last 8760 hours. HbA1C: No results for input(s): HGBA1C in the last 72 hours. CBG: No results for input(s): GLUCAP in the last 168 hours. Lipid Profile: No results for input(s): CHOL, HDL, LDLCALC, TRIG, CHOLHDL, LDLDIRECT in the last 72 hours. Thyroid Function Tests: No results for input(s): TSH, T4TOTAL, FREET4, T3FREE, THYROIDAB in the last 72 hours. Anemia Panel: No results for input(s): VITAMINB12, FOLATE, FERRITIN, TIBC, IRON, RETICCTPCT in the last 72 hours. Urine analysis:    Component Value Date/Time   COLORURINE STRAW (A) 06/27/2018 0818   APPEARANCEUR CLEAR 06/27/2018 0818   LABSPEC 1.005 06/27/2018 0818   PHURINE 7.0 06/27/2018 0818   GLUCOSEU NEGATIVE 06/27/2018 0818   HGBUR SMALL (A) 06/27/2018 0818   BILIRUBINUR NEGATIVE 06/27/2018 0818   KETONESUR NEGATIVE 06/27/2018 0818   PROTEINUR NEGATIVE 06/27/2018 0818   UROBILINOGEN 1.0 10/14/2012 0922   NITRITE NEGATIVE 06/27/2018 0818   LEUKOCYTESUR SMALL (A) 06/27/2018 0818    Radiological Exams on Admission: DG Chest 2 View  Result Date: 06/22/2019 CLINICAL DATA:  Chest pain EXAM: CHEST - 2 VIEW  COMPARISON:  04/17/2018 FINDINGS: Moderate cardiomegaly. Unchanged position of left chest wall pacemaker. Remote median sternotomy and CABG. There is bibasilar atelectasis. Mild interstitial opacity without overt pulmonary edema. Pulmonary vascular congestion. No sizable pleural effusion. IMPRESSION: Cardiomegaly and pulmonary vascular congestion without overt pulmonary edema. Electronically Signed   By: Ulyses Jarred M.D.   On: 06/22/2019 21:18   CT Angio Chest PE W/Cm &/Or Wo Cm  Result Date: 06/22/2019 CLINICAL DATA:  Chest pain shortness of breath EXAM: CT ANGIOGRAPHY CHEST WITH CONTRAST TECHNIQUE: Multidetector CT imaging of the chest was performed using the standard protocol during bolus administration of intravenous contrast. Multiplanar CT image reconstructions and MIPs were obtained to evaluate the vascular anatomy. CONTRAST:  96mL OMNIPAQUE IOHEXOL 350 MG/ML SOLN COMPARISON:  None. FINDINGS: Cardiovascular: There is a optimal opacification of the pulmonary arteries. There is no central,segmental, or subsegmental filling defects within the pulmonary arteries. There is moderate cardiomegaly. No pericardial effusion or thickening. No evidence right heart strain. There is normal three-vessel brachiocephalic anatomy without proximal stenosis. Scattered aortic atherosclerosis is seen. Coronary artery calcifications are seen. A left-sided pacemaker seen with the lead tips in the right atrium and right ventricle. Mediastinum/Nodes: No hilar, mediastinal, or axillary adenopathy. Thyroid gland, trachea, and esophagus demonstrate no significant findings. Lungs/Pleura: Patchy airspace opacities are seen at both lung bases. No pleural effusions are noted. Upper Abdomen: No acute abnormalities present in the visualized portions of the upper abdomen. Musculoskeletal: No chest wall abnormality. No acute or significant osseous findings. Overlying median sternotomy wires are present. Review of the MIP images confirms  the above findings. IMPRESSION: No central, segmental, or subsegmental pulmonary embolism. Bilateral patchy airspace opacities at both lung bases which could be due  to atelectasis or infectious etiology. Aortic Atherosclerosis (ICD10-I70.0). Electronically Signed   By: Jonna ClarkBindu  Avutu M.D.   On: 06/22/2019 23:49    EKG: Independently reviewed.   Assessment/Plan  Acute hypoxic respiratory failure secondary to Community acquired pneumonia will admit as PUI for now while COVID PCR is pending althought she completed her vaccine 2 months ago Received Vanc/Cefepime/Flagyl in the ED. Will switch over to Rocephin and Azithromycin  will get RVP to rule out any viral cause  Acute on chronic kidney disease stage 3b Received 500cc bolus in the ED avoid neprotoxic agents   Hypertension continue amlodipine hold ACE-I due to AKI  Permanent A.fib continue Eliquis   CAD s/p CABG/pacemaker stable. Troponin flat.   Chronic diastolic HF  does not appear fluid overloaded  Hold Lasix for now with AKI continue spironolactone  DVT prophylaxis:.Eliquis Code Status: DNR- pt reports daughter had legal paperwork for that Family Communication: Plan discussed with patient at bedside  disposition Plan: Home with at least 2 midnight stays  Consults called:  Admission status: inpatient Status is: Inpatient  Remains inpatient appropriate because:IV treatments appropriate due to intensity of illness or inability to take PO   Dispo: The patient is from: Home              Anticipated d/c is to: Home              Anticipated d/c date is: 3 days              Patient currently is not medically stable to d/c.          Anselm Junglinghing T Calypso Hagarty DO [facility]   If 7PM-7AM, please contact night-coverage [email or web address]   06/23/2019, 12:25 AM

## 2019-06-23 NOTE — Progress Notes (Signed)
PROGRESS NOTE  PARIS HOHN BMW:[id number] DOB: 09/22/1924 DOA: 06/22/2019 PCP: Pearson Grippe, MD  HPI/Recap of past 24 hours: HPI from Dr Philipp Ovens is a 86 y.o. female with medical history significant for Hx of HTN, permanent atrial fibrillation on Eliquis, CAD s/p CABG, hx of syncope with pacemaker, chronic diastolic heart failure, HLD who presents with acute onset of cough and chest pain. Patient says that she went to the laundromat in her building today and then went home to have a meal.  She then suddenly developed an acute cough productive of sputum.  States the cough was so violent that it gave her midsternal chest pain as well as abdominal pain.  She also vomited some clear sputum. No worsening lower extremity edema.  Denies any fever but felt chills. Denies any sick contact.  She finished her 2 doses of Moderna Covid vaccine back in February. PTA, had a checkup with her PCP and was diagnosed with UTI and started on antibiotics. In the ED, Temp of 102.1, normotensive, on room air. WBC of 9.7, creatinine of 1.54 from a prior of 1.11, total bilirubin 1.5. BNP of 171. Troponin of 21 and then 22. CTA chest shows bilateral airspace opacity at both lung base.  Patient admitted for further management.    Today, patient still with some cough, denies any further chest pain, worsening shortness of breath, abdominal pain, nausea/vomiting, fever/chills.  Assessment/Plan: Active Problems:   Hypertension   Hx of CABG X 4 3/06-    Permanent atrial fibrillation (HCC)   Hypercholesterolemia   Chronic diastolic heart failure (HCC)   Community acquired bacterial pneumonia   Acute kidney injury superimposed on chronic kidney disease (HCC)   Sepsis likely 2/2 CAP On admission tachypneic, febrile Currently afebrile with leukocytosis D-dimer elevated CTA chest with no PE, but showing bilateral opacities Urine strep pneumo, Legionella pending Respiratory viral panel pending COVID-19 test  negative Continue ceftriaxone, Azithromycin  AKI on CKD stage IIIb/with metabolic acidosis Creatinine baseline around 1.1 Status post gentle hydration in the ED, will continue maintenance IV fluids Avoid nephrotoxic's, strict I's and O's Daily BMP  Hypertension Continue amlodipine, continue to hold ACE inhibitor due to AKI  Permanent A. Fib Rate controlled Continue Eliquis History of pacemaker  CAD status post CABG Currently chest pain-free Troponin flat EKG with no acute ST changes  Chronic diastolic HF Appears euvolemic Hold home Lasix, Aldactone for now due to AKI  Hypothyroidism TSH, free T4 pending Continue Synthroid           Malnutrition Type:      Malnutrition Characteristics:      Nutrition Interventions:       Estimated body mass index is 29.05 kg/m as calculated from the following:   Height as of this encounter: 5\' 3"  (1.6 m).   Weight as of this encounter: 74.4 kg.     Code Status: DNR  Family Communication: Discussed extensively with daughter at bedside  Disposition Plan: Status is: Inpatient  Remains inpatient appropriate because:IV treatments appropriate due to intensity of illness or inability to take PO   Dispo: The patient is from: Home              Anticipated d/c is to: Home              Anticipated d/c date is: 2 days              Patient currently is not medically stable to d/c.  Consultants:  None  Procedures:  None  Antimicrobials:  Ceftriaxone  Azithromycin  DVT prophylaxis: Lovenox   Objective: Vitals:   06/23/19 0615 06/23/19 0630 06/23/19 0700 06/23/19 0939  BP: (!) 134/50 (!) 131/48 114/71 129/72  Pulse: 63 62 71 60  Resp: (!) 25 (!) 24 17 (!) 23  Temp:   98.7 F (37.1 C)   TempSrc:   Oral   SpO2: 93% 94% 94% 92%  Weight:      Height:       No intake or output data in the 24 hours ending 06/23/19 1258 Filed Weights   06/22/19 2351  Weight: 74.4 kg    Exam:  General: NAD    Cardiovascular: S1, S2 present  Respiratory:  Diminished breath sounds, bibasilar crackles  Abdomen: Soft, nontender, nondistended, bowel sounds present  Musculoskeletal: No bilateral pedal edema noted  Skin: Normal  Psychiatry: Normal mood   Data Reviewed: CBC: Recent Labs  Lab 06/22/19 2107 06/23/19 0330  WBC 9.7 13.8*  NEUTROABS 9.0*  --   HGB 13.1 11.6*  HCT 40.5 37.4  MCV 90.8 91.0  PLT 159 161   Basic Metabolic Panel: Recent Labs  Lab 06/22/19 2107 06/23/19 0330  NA 138 139  K 4.5 4.0  CL 107 109  CO2 21* 13*  GLUCOSE 109* 117*  BUN 25* 22  CREATININE 1.47* 1.43*  CALCIUM 10.2 9.5   GFR: Estimated Creatinine Clearance: 23.7 mL/min (A) (by C-G formula based on SCr of 1.43 mg/dL (H)). Liver Function Tests: Recent Labs  Lab 06/22/19 2107  AST 30  ALT 19  ALKPHOS 69  BILITOT 1.5*  PROT 6.3*  ALBUMIN 3.7   No results for input(s): LIPASE, AMYLASE in the last 168 hours. No results for input(s): AMMONIA in the last 168 hours. Coagulation Profile: Recent Labs  Lab 06/22/19 2349  INR 1.5*   Cardiac Enzymes: No results for input(s): CKTOTAL, CKMB, CKMBINDEX, TROPONINI in the last 168 hours. BNP (last 3 results) No results for input(s): PROBNP in the last 8760 hours. HbA1C: No results for input(s): HGBA1C in the last 72 hours. CBG: No results for input(s): GLUCAP in the last 168 hours. Lipid Profile: No results for input(s): CHOL, HDL, LDLCALC, TRIG, CHOLHDL, LDLDIRECT in the last 72 hours. Thyroid Function Tests: No results for input(s): TSH, T4TOTAL, FREET4, T3FREE, THYROIDAB in the last 72 hours. Anemia Panel: No results for input(s): VITAMINB12, FOLATE, FERRITIN, TIBC, IRON, RETICCTPCT in the last 72 hours. Urine analysis:    Component Value Date/Time   COLORURINE YELLOW 06/22/2019 0106   APPEARANCEUR CLEAR 06/22/2019 0106   LABSPEC 1.019 06/22/2019 0106   PHURINE 5.0 06/22/2019 0106   GLUCOSEU NEGATIVE 06/22/2019 0106   HGBUR  NEGATIVE 06/22/2019 0106   BILIRUBINUR NEGATIVE 06/22/2019 0106   KETONESUR NEGATIVE 06/22/2019 0106   PROTEINUR NEGATIVE 06/22/2019 0106   UROBILINOGEN 1.0 10/14/2012 0922   NITRITE NEGATIVE 06/22/2019 0106   LEUKOCYTESUR NEGATIVE 06/22/2019 0106   Sepsis Labs: @LABRCNTIP (procalcitonin:4,lacticidven:4)  ) Recent Results (from the past 240 hour(s))  Culture, blood (routine x 2)     Status: None (Preliminary result)   Collection Time: 06/22/19 10:30 PM   Specimen: BLOOD  Result Value Ref Range Status   Specimen Description BLOOD RIGHT ARM  Final   Special Requests   Final    BOTTLES DRAWN AEROBIC AND ANAEROBIC Blood Culture adequate volume   Culture   Final    NO GROWTH < 12 HOURS Performed at [facility], [address], [place], [place] [address]    Report Status PENDING  Incomplete  Culture, blood (routine x 2)     Status: None (Preliminary result)   Collection Time: 06/22/19 10:35 PM   Specimen: BLOOD  Result Value Ref Range Status   Specimen Description BLOOD RIGHT ARM  Final   Special Requests   Final    BOTTLES DRAWN AEROBIC AND ANAEROBIC Blood Culture adequate volume   Culture   Final    NO GROWTH < 12 HOURS Performed at [facility], [place] [address], [place], [place] [address]    Report Status PENDING  Incomplete  Respiratory Panel by RT PCR (Flu A&B, Covid) - Nasopharyngeal Swab     Status: None   Collection Time: 06/23/19  1:06 AM   Specimen: Nasopharyngeal Swab  Result Value Ref Range Status   SARS Coronavirus 2 by RT PCR NEGATIVE NEGATIVE Final    Comment: (NOTE) SARS-CoV-2 target nucleic acids are NOT DETECTED. The SARS-CoV-2 RNA is generally detectable in upper respiratoy specimens during the acute phase of infection. The lowest concentration of SARS-CoV-2 viral copies this assay can detect is 131 copies/mL. A negative result does not preclude SARS-Cov-2 infection and should not be used as the sole basis for treatment or other patient  management decisions. A negative result may occur with  improper specimen collection/handling, submission of specimen other than nasopharyngeal swab, presence of viral mutation(s) within the areas targeted by this assay, and inadequate number of viral copies (<131 copies/mL). A negative result must be combined with clinical observations, patient history, and epidemiological information. The expected result is Negative. Fact Sheet for Patients:  PinkCheek.be Fact Sheet for Healthcare Providers:  GravelBags.it This test is not yet ap proved or cleared by the [place] FDA and  has been authorized for detection and/or diagnosis of SARS-CoV-2 by FDA under an Emergency Use Authorization (EUA). This EUA will remain  in effect (meaning this test can be used) for the duration of the COVID-19 declaration under Section 564(b)(1) of the Act, 21 U.S.C. section 360bbb-3(b)(1), unless the authorization is terminated or revoked sooner.    Influenza A by PCR NEGATIVE NEGATIVE Final   Influenza B by PCR NEGATIVE NEGATIVE Final    Comment: (NOTE) The Xpert Xpress SARS-CoV-2/FLU/RSV assay is intended as an aid in  the diagnosis of influenza from Nasopharyngeal swab specimens and  should not be used as a sole basis for treatment. Nasal washings and  aspirates are unacceptable for Xpert Xpress SARS-CoV-2/FLU/RSV  testing. Fact Sheet for Patients: PinkCheek.be Fact Sheet for Healthcare Providers: GravelBags.it This test is not yet approved or cleared by the [place] FDA and  has been authorized for detection and/or diagnosis of SARS-CoV-2 by  FDA under an Emergency Use Authorization (EUA). This EUA will remain  in effect (meaning this test can be used) for the duration of the  Covid-19 declaration under Section 564(b)(1) of the Act, 21  U.S.C. section 360bbb-3(b)(1), unless the  authorization is  terminated or revoked. Performed at [facility], [place] [address] [place], [place] [address]       Studies: DG Chest 2 View  Result Date: 06/22/2019 CLINICAL DATA:  Chest pain EXAM: CHEST - 2 VIEW COMPARISON:  04/17/2018 FINDINGS: Moderate cardiomegaly. Unchanged position of left chest wall pacemaker. Remote median sternotomy and CABG. There is bibasilar atelectasis. Mild interstitial opacity without overt pulmonary edema. Pulmonary vascular congestion. No sizable pleural effusion. IMPRESSION: Cardiomegaly and pulmonary vascular congestion without overt pulmonary edema. Electronically Signed  By: Deatra Robinson M.D.   On: 06/22/2019 21:18   CT Angio Chest PE W/Cm &/Or Wo Cm  Result Date: 06/22/2019 CLINICAL DATA:  Chest pain shortness of breath EXAM: CT ANGIOGRAPHY CHEST WITH CONTRAST TECHNIQUE: Multidetector CT imaging of the chest was performed using the standard protocol during bolus administration of intravenous contrast. Multiplanar CT image reconstructions and MIPs were obtained to evaluate the vascular anatomy. CONTRAST:  73mL OMNIPAQUE IOHEXOL 350 MG/ML SOLN COMPARISON:  None. FINDINGS: Cardiovascular: There is a optimal opacification of the pulmonary arteries. There is no central,segmental, or subsegmental filling defects within the pulmonary arteries. There is moderate cardiomegaly. No pericardial effusion or thickening. No evidence right heart strain. There is normal three-vessel brachiocephalic anatomy without proximal stenosis. Scattered aortic atherosclerosis is seen. Coronary artery calcifications are seen. A left-sided pacemaker seen with the lead tips in the right atrium and right ventricle. Mediastinum/Nodes: No hilar, mediastinal, or axillary adenopathy. Thyroid gland, trachea, and esophagus demonstrate no significant findings. Lungs/Pleura: Patchy airspace opacities are seen at both lung bases. No pleural effusions are noted. Upper Abdomen: No acute  abnormalities present in the visualized portions of the upper abdomen. Musculoskeletal: No chest wall abnormality. No acute or significant osseous findings. Overlying median sternotomy wires are present. Review of the MIP images confirms the above findings. IMPRESSION: No central, segmental, or subsegmental pulmonary embolism. Bilateral patchy airspace opacities at both lung bases which could be due to atelectasis or infectious etiology. Aortic Atherosclerosis (ICD10-I70.0). Electronically Signed   By: Jonna Clark M.D.   On: 06/22/2019 23:49    Scheduled Meds: . amLODipine  10 mg Oral Daily  . apixaban  5 mg Oral BID  . atorvastatin  80 mg Oral QHS  . levothyroxine  50 mcg Oral QAC breakfast  . spironolactone  12.5 mg Oral Daily    Continuous Infusions: . azithromycin Stopped (06/23/19 0406)  . cefTRIAXone (ROCEPHIN)  IV       LOS: 0 days     Briant Cedar, MD [facility]  If 7PM-7AM, please contact night-coverage [email or web address] 06/23/2019, 12:58 PM

## 2019-06-23 NOTE — Plan of Care (Signed)
  Problem: Education: Goal: Knowledge of General Education information will improve Description Including pain rating scale, medication(s)/side effects and non-pharmacologic comfort measures Outcome: Progressing   Problem: Health Behavior/Discharge Planning: Goal: Ability to manage health-related needs will improve Outcome: Progressing   

## 2019-06-24 DIAGNOSIS — J189 Pneumonia, unspecified organism: Secondary | ICD-10-CM

## 2019-06-24 LAB — CBC WITH DIFFERENTIAL/PLATELET
Abs Immature Granulocytes: 0.03 10*3/uL (ref 0.00–0.07)
Basophils Absolute: 0 10*3/uL (ref 0.0–0.1)
Basophils Relative: 0 %
Eosinophils Absolute: 0.1 10*3/uL (ref 0.0–0.5)
Eosinophils Relative: 1 %
HCT: 35.4 % — ABNORMAL LOW (ref 36.0–46.0)
Hemoglobin: 11.1 g/dL — ABNORMAL LOW (ref 12.0–15.0)
Immature Granulocytes: 0 %
Lymphocytes Relative: 10 %
Lymphs Abs: 0.9 10*3/uL (ref 0.7–4.0)
MCH: 27.7 pg (ref 26.0–34.0)
MCHC: 31.4 g/dL (ref 30.0–36.0)
MCV: 88.3 fL (ref 80.0–100.0)
Monocytes Absolute: 0.5 10*3/uL (ref 0.1–1.0)
Monocytes Relative: 6 %
Neutro Abs: 6.9 10*3/uL (ref 1.7–7.7)
Neutrophils Relative %: 83 %
Platelets: 150 10*3/uL (ref 150–400)
RBC: 4.01 MIL/uL (ref 3.87–5.11)
RDW: 14.1 % (ref 11.5–15.5)
WBC: 8.3 10*3/uL (ref 4.0–10.5)
nRBC: 0 % (ref 0.0–0.2)

## 2019-06-24 LAB — BASIC METABOLIC PANEL
Anion gap: 7 (ref 5–15)
BUN: 16 mg/dL (ref 8–23)
CO2: 23 mmol/L (ref 22–32)
Calcium: 9.4 mg/dL (ref 8.9–10.3)
Chloride: 113 mmol/L — ABNORMAL HIGH (ref 98–111)
Creatinine, Ser: 1.2 mg/dL — ABNORMAL HIGH (ref 0.44–1.00)
GFR calc Af Amer: 45 mL/min — ABNORMAL LOW (ref >60)
GFR calc non Af Amer: 39 mL/min — ABNORMAL LOW (ref >60)
Glucose, Bld: 105 mg/dL — ABNORMAL HIGH (ref 70–99)
Potassium: 4.3 mmol/L (ref 3.5–5.1)
Sodium: 143 mmol/L (ref 135–145)

## 2019-06-24 MED ORDER — CALCIUM CARBONATE ANTACID 500 MG PO CHEW
400.0000 mg | CHEWABLE_TABLET | Freq: Once | ORAL | Status: AC
  Administered 2019-06-24: 400 mg via ORAL
  Filled 2019-06-24: qty 2

## 2019-06-24 NOTE — Evaluation (Signed)
Physical Therapy Evaluation Patient Details Name: Stephanie Atkinson MRN: [id number] DOB: 05/10/1924 Today's Date: 06/24/2019   History of Present Illness  86 y.o. female with medical history significant for Hx of HTN, permanent atrial fibrillation on Eliquis, CAD s/p CABG, hx of syncope with pacemaker, chronic diastolic heart failure, HLD who presents with acute onset of cough and chest pain. Pt admitted for CAP.    Clinical Impression  Pt admitted with above diagnosis. PTA pt resided in ILF, independent mobility and ADLs. On eval, she required supervision bed mobility, min guard assist transfers and min guard assist ambulation 150' without AD. Mobilized on RA with desat to 91%. 1/4 DOE noted. Pt fatigues quickly.  Pt currently with functional limitations due to the deficits listed below (see PT Problem List). Pt will benefit from skilled PT to increase their independence and safety with mobility to allow discharge to the venue listed below.       Follow Up Recommendations Home health PT;Supervision - Intermittent    Equipment Recommendations  None recommended by PT    Recommendations for Other Services       Precautions / Restrictions Precautions Precautions: Fall      Mobility  Bed Mobility Overal bed mobility: Needs Assistance Bed Mobility: Supine to Sit;Sit to Supine     Supine to sit: Supervision;HOB elevated Sit to supine: Supervision   General bed mobility comments: cues for sequencing, +rail, increased time  Transfers Overall transfer level: Needs assistance Equipment used: None Transfers: Sit to/from Stand Sit to Stand: Min guard         General transfer comment: increased time to stabilize initial balance  Ambulation/Gait Ambulation/Gait assistance: Min guard Gait Distance (Feet): 150 Feet Assistive device: None Gait Pattern/deviations: Step-through pattern;Decreased stride length Gait velocity: decreased Gait velocity interpretation: <1.31 ft/sec,  indicative of household ambulator General Gait Details: Slow, steady gait. Ambulated on RA with SpO2 91-93%. 1/4 DOE. Fatigues quickly.  Stairs            Wheelchair Mobility    Modified Rankin (Stroke Patients Only)       Balance Overall balance assessment: Mild deficits observed, not formally tested                                           Pertinent Vitals/Pain Pain Assessment: No/denies pain    Home Living Family/patient expects to be discharged to:: Private residence Living Arrangements: Alone Available Help at Discharge: Family;Available PRN/intermittently Type of Home: Independent living facility Home Access: Elevator     Home Layout: One level Home Equipment: Shower seat - built in;Hand held shower head;Grab bars - tub/shower      Prior Function Level of Independence: Independent               Hand Dominance        Extremity/Trunk Assessment   Upper Extremity Assessment Upper Extremity Assessment: Generalized weakness    Lower Extremity Assessment Lower Extremity Assessment: Generalized weakness    Cervical / Trunk Assessment Cervical / Trunk Assessment: Kyphotic  Communication   Communication: No difficulties  Cognition Arousal/Alertness: Awake/alert Behavior During Therapy: WFL for tasks assessed/performed Overall Cognitive Status: Within Functional Limits for tasks assessed  General Comments      Exercises     Assessment/Plan    PT Assessment Patient needs continued PT services  PT Problem List Decreased strength;Decreased mobility;Decreased activity tolerance;Cardiopulmonary status limiting activity;Decreased balance       PT Treatment Interventions Therapeutic activities;Gait training;Therapeutic exercise;Patient/family education;Balance training;Functional mobility training    PT Goals (Current goals can be found in the Care Plan section)  Acute  Rehab PT Goals Patient Stated Goal: home, move better PT Goal Formulation: With patient Time For Goal Achievement: 07/08/19 Potential to Achieve Goals: Good    Frequency Min 3X/week   Barriers to discharge        Co-evaluation               AM-PAC PT "6 Clicks" Mobility  Outcome Measure Help needed turning from your back to your side while in a flat bed without using bedrails?: None Help needed moving from lying on your back to sitting on the side of a flat bed without using bedrails?: A Little Help needed moving to and from a bed to a chair (including a wheelchair)?: A Little Help needed standing up from a chair using your arms (e.g., wheelchair or bedside chair)?: A Little Help needed to walk in hospital room?: A Little Help needed climbing 3-5 steps with a railing? : A Lot 6 Click Score: 18    End of Session Equipment Utilized During Treatment: Gait belt Activity Tolerance: Patient tolerated treatment well Patient left: in bed;with call bell/phone within reach Nurse Communication: Mobility status PT Visit Diagnosis: Difficulty in walking, not elsewhere classified (R26.2);Muscle weakness (generalized) (M62.81)    Time: 6811-5726 PT Time Calculation (min) (ACUTE ONLY): 29 min   Charges:   PT Evaluation $PT Eval Moderate Complexity: 1 Mod PT Treatments $Gait Training: 8-22 mins        Aida Raider, PT  Office # [phone] Pager [phone]   Ilda Foil 06/24/2019, 12:28 PM

## 2019-06-24 NOTE — Progress Notes (Signed)
PROGRESS NOTE  Stephanie Atkinson WJX:[id number] DOB: [birth year] DOA: 06/22/2019 PCP: Jani Gravel, MD  HPI/Recap of past 24 hours: HPI from Dr Elsie Saas is a 86 y.o. female with medical history significant for Hx of HTN, permanent atrial fibrillation on Eliquis, CAD s/p CABG, hx of syncope with pacemaker, chronic diastolic heart failure, HLD who presents with acute onset of cough and chest pain. Patient says that she went to the laundromat in her building today and then went home to have a meal.  She then suddenly developed an acute cough productive of sputum.  States the cough was so violent that it gave her midsternal chest pain as well as abdominal pain.  She also vomited some clear sputum. No worsening lower extremity edema.  Denies any fever but felt chills. Denies any sick contact.  She finished her 2 doses of Moderna Covid vaccine back in February. PTA, had a checkup with her PCP and was diagnosed with UTI and started on antibiotics. In the ED, Temp of 102.1, normotensive, on room air. WBC of 9.7, creatinine of 1.54 from a prior of 1.11, total bilirubin 1.5. BNP of 171. Troponin of 21 and then 22. CTA chest shows bilateral airspace opacity at both lung base.  Patient admitted for further management.     Today, patient denies any new complaints, still having some minimal cough with associated chest tightness, denies any left-sided chest pain, shortness of breath, abdominal pain, nausea/vomiting, fever/chills.    Assessment/Plan: Active Problems:   Hypertension   Hx of CABG X 4 3/06-    Permanent atrial fibrillation (HCC)   Hypercholesterolemia   Chronic diastolic heart failure ([place])   Community acquired bacterial pneumonia   Acute kidney injury superimposed on chronic kidney disease ([place])   Sepsis likely 2/2 CAP On admission tachypneic, febrile Currently afebrile with resolved leukocytosis D-dimer elevated CTA chest with no PE, but showing bilateral opacities Urine strep  pneumo negative, Legionella pending Covid/influenza negative Continue ceftriaxone, Azithromycin  AKI on CKD stage IIIb/with metabolic acidosis Creatinine baseline around 1.1 Status post gentle hydration Avoid nephrotoxic's, strict I's and O's Daily BMP  Hypertension Continue amlodipine, continue to hold ACE inhibitor due to AKI  Permanent A. Fib Rate controlled Continue Eliquis History of pacemaker  CAD status post CABG Currently chest pain-free Troponin flat EKG with no acute ST changes  Chronic diastolic HF Appears euvolemic Hold home Lasix, Aldactone for now due to AKI  Hypothyroidism TSH 0.835, free T4, 1.01 Continue Synthroid           Malnutrition Type:      Malnutrition Characteristics:      Nutrition Interventions:       Estimated body mass index is 30.7 kg/m as calculated from the following:   Height as of this encounter: 5\' 3"  (1.6 m).   Weight as of this encounter: 78.6 kg.     Code Status: DNR  Family Communication: Discussed extensively with patient  Disposition Plan: Status is: Inpatient  Remains inpatient appropriate because:IV treatments appropriate due to intensity of illness or inability to take PO   Dispo: The patient is from: Home              Anticipated d/c is to: Home              Anticipated d/c date is: 1 day              Patient currently is not medically stable to d/c.    Consultants:  None  Procedures:  None  Antimicrobials:  Ceftriaxone  Azithromycin  DVT prophylaxis: Lovenox   Objective: Vitals:   06/24/19 0300 06/24/19 0806 06/24/19 1240 06/24/19 1552  BP: (!) 137/56 (!) 135/58 (!) 140/59 (!) 137/51  Pulse: 62 61 62 (!) 57  Resp: 20 15 (!) 25   Temp: 99 F (37.2 C) 99 F (37.2 C) 99.1 F (37.3 C) 98.9 F (37.2 C)  TempSrc: Oral Oral Oral Oral  SpO2: 92% 93% 94% 94%  Weight: 78.6 kg     Height:        Intake/Output Summary (Last 24 hours) at 06/24/2019 1604 Last data filed at  06/24/2019 1001 Gross per 24 hour  Intake 360 ml  Output 450 ml  Net -90 ml   Filed Weights   06/22/19 2351 06/24/19 0300  Weight: 74.4 kg 78.6 kg    Exam:  General: NAD   Cardiovascular: S1, S2 present  Respiratory:  Diminished breath sounds, mild bibasilar crackles  Abdomen: Soft, nontender, nondistended, bowel sounds present  Musculoskeletal: No bilateral pedal edema noted  Skin: Normal  Psychiatry: Normal mood   Data Reviewed: CBC: Recent Labs  Lab 06/22/19 2107 06/23/19 0330 06/24/19 0429  WBC 9.7 13.8* 8.3  NEUTROABS 9.0*  --  6.9  HGB 13.1 11.6* 11.1*  HCT 40.5 37.4 35.4*  MCV 90.8 91.0 88.3  PLT 159 161 150   Basic Metabolic Panel: Recent Labs  Lab 06/22/19 2107 06/23/19 0330 06/24/19 0429  NA 138 139 143  K 4.5 4.0 4.3  CL 107 109 113*  CO2 21* 13* 23  GLUCOSE 109* 117* 105*  BUN 25* 22 16  CREATININE 1.47* 1.43* 1.20*  CALCIUM 10.2 9.5 9.4   GFR: Estimated Creatinine Clearance: 29.1 mL/min (A) (by C-G formula based on SCr of 1.2 mg/dL (H)). Liver Function Tests: Recent Labs  Lab 06/22/19 2107  AST 30  ALT 19  ALKPHOS 69  BILITOT 1.5*  PROT 6.3*  ALBUMIN 3.7   No results for input(s): LIPASE, AMYLASE in the last 168 hours. No results for input(s): AMMONIA in the last 168 hours. Coagulation Profile: Recent Labs  Lab 06/22/19 2349  INR 1.5*   Cardiac Enzymes: No results for input(s): CKTOTAL, CKMB, CKMBINDEX, TROPONINI in the last 168 hours. BNP (last 3 results) No results for input(s): PROBNP in the last 8760 hours. HbA1C: No results for input(s): HGBA1C in the last 72 hours. CBG: No results for input(s): GLUCAP in the last 168 hours. Lipid Profile: No results for input(s): CHOL, HDL, LDLCALC, TRIG, CHOLHDL, LDLDIRECT in the last 72 hours. Thyroid Function Tests: Recent Labs    06/23/19 1325  TSH 0.835  FREET4 1.01   Anemia Panel: No results for input(s): VITAMINB12, FOLATE, FERRITIN, TIBC, IRON, RETICCTPCT in the  last 72 hours. Urine analysis:    Component Value Date/Time   COLORURINE YELLOW 06/22/2019 0106   APPEARANCEUR CLEAR 06/22/2019 0106   LABSPEC 1.019 06/22/2019 0106   PHURINE 5.0 06/22/2019 0106   GLUCOSEU NEGATIVE 06/22/2019 0106   HGBUR NEGATIVE 06/22/2019 0106   BILIRUBINUR NEGATIVE 06/22/2019 0106   KETONESUR NEGATIVE 06/22/2019 0106   PROTEINUR NEGATIVE 06/22/2019 0106   UROBILINOGEN 1.0 10/14/2012 0922   NITRITE NEGATIVE 06/22/2019 0106   LEUKOCYTESUR NEGATIVE 06/22/2019 0106   Sepsis Labs: @LABRCNTIP (procalcitonin:4,lacticidven:4)  ) Recent Results (from the past 240 hour(s))  Urine culture     Status: None   Collection Time: 06/22/19  1:06 AM   Specimen: Urine, Random  Result Value Ref Range Status  Specimen Description URINE, RANDOM  Final   Special Requests NONE  Final   Culture   Final    NO GROWTH Performed at [facility], [address] [place], [place] [address]    Report Status 06/23/2019 FINAL  Final  Culture, blood (routine x 2)     Status: None (Preliminary result)   Collection Time: 06/22/19 10:30 PM   Specimen: BLOOD  Result Value Ref Range Status   Specimen Description BLOOD RIGHT ARM  Final   Special Requests   Final    BOTTLES DRAWN AEROBIC AND ANAEROBIC Blood Culture adequate volume   Culture   Final    NO GROWTH 2 DAYS Performed at [facility], [address], [place], [place] [address]    Report Status PENDING  Incomplete  Culture, blood (routine x 2)     Status: None (Preliminary result)   Collection Time: 06/22/19 10:35 PM   Specimen: BLOOD  Result Value Ref Range Status   Specimen Description BLOOD RIGHT ARM  Final   Special Requests   Final    BOTTLES DRAWN AEROBIC AND ANAEROBIC Blood Culture adequate volume   Culture   Final    NO GROWTH 2 DAYS Performed at [facility], [address] [place], [place] [address]    Report Status PENDING  Incomplete  Respiratory Panel by RT PCR (Flu A&B, Covid) -  Nasopharyngeal Swab     Status: None   Collection Time: 06/23/19  1:06 AM   Specimen: Nasopharyngeal Swab  Result Value Ref Range Status   SARS Coronavirus 2 by RT PCR NEGATIVE NEGATIVE Final    Comment: (NOTE) SARS-CoV-2 target nucleic acids are NOT DETECTED. The SARS-CoV-2 RNA is generally detectable in upper respiratoy specimens during the acute phase of infection. The lowest concentration of SARS-CoV-2 viral copies this assay can detect is 131 copies/mL. A negative result does not preclude SARS-Cov-2 infection and should not be used as the sole basis for treatment or other patient management decisions. A negative result may occur with  improper specimen collection/handling, submission of specimen other than nasopharyngeal swab, presence of viral mutation(s) within the areas targeted by this assay, and inadequate number of viral copies (<131 copies/mL). A negative result must be combined with clinical observations, patient history, and epidemiological information. The expected result is Negative. Fact Sheet for Patients:  https://www.moore.com/ Fact Sheet for Healthcare Providers:  https://www.young.biz/ This test is not yet ap proved or cleared by the [place] FDA and  has been authorized for detection and/or diagnosis of SARS-CoV-2 by FDA under an Emergency Use Authorization (EUA). This EUA will remain  in effect (meaning this test can be used) for the duration of the COVID-19 declaration under Section 564(b)(1) of the Act, 21 U.S.C. section 360bbb-3(b)(1), unless the authorization is terminated or revoked sooner.    Influenza A by PCR NEGATIVE NEGATIVE Final   Influenza B by PCR NEGATIVE NEGATIVE Final    Comment: (NOTE) The Xpert Xpress SARS-CoV-2/FLU/RSV assay is intended as an aid in  the diagnosis of influenza from Nasopharyngeal swab specimens and  should not be used as a sole basis for treatment. Nasal washings and  aspirates  are unacceptable for Xpert Xpress SARS-CoV-2/FLU/RSV  testing. Fact Sheet for Patients: https://www.moore.com/ Fact Sheet for Healthcare Providers: https://www.young.biz/ This test is not yet approved or cleared by the [place] FDA and  has been authorized for detection and/or diagnosis of SARS-CoV-2 by  FDA under an Emergency Use Authorization (EUA).  This EUA will remain  in effect (meaning this test can be used) for the duration of the  Covid-19 declaration under Section 564(b)(1) of the Act, 21  U.S.C. section 360bbb-3(b)(1), unless the authorization is  terminated or revoked. Performed at [facility], [address], [place], [place] [address]       Studies: No results found.  Scheduled Meds: . amLODipine  10 mg Oral Daily  . apixaban  5 mg Oral BID  . atorvastatin  80 mg Oral QHS  . levothyroxine  50 mcg Oral QAC breakfast    Continuous Infusions: . azithromycin Stopped (06/24/19 0000)  . cefTRIAXone (ROCEPHIN)  IV Stopped (06/24/19 0000)     LOS: 1 day     Briant Cedar, MD [facility]  If 7PM-7AM, please contact night-coverage [email or web address] 06/24/2019, 4:04 PM

## 2019-06-25 ENCOUNTER — Inpatient Hospital Stay (HOSPITAL_COMMUNITY): Payer: Medicare PPO

## 2019-06-25 LAB — LEGIONELLA PNEUMOPHILA SEROGP 1 UR AG: L. pneumophila Serogp 1 Ur Ag: NEGATIVE

## 2019-06-25 LAB — CBC WITH DIFFERENTIAL/PLATELET
Abs Immature Granulocytes: 0.02 10*3/uL (ref 0.00–0.07)
Basophils Absolute: 0 10*3/uL (ref 0.0–0.1)
Basophils Relative: 0 %
Eosinophils Absolute: 0.1 10*3/uL (ref 0.0–0.5)
Eosinophils Relative: 1 %
HCT: 35.8 % — ABNORMAL LOW (ref 36.0–46.0)
Hemoglobin: 11.2 g/dL — ABNORMAL LOW (ref 12.0–15.0)
Immature Granulocytes: 0 %
Lymphocytes Relative: 17 %
Lymphs Abs: 1.2 10*3/uL (ref 0.7–4.0)
MCH: 27.9 pg (ref 26.0–34.0)
MCHC: 31.3 g/dL (ref 30.0–36.0)
MCV: 89.3 fL (ref 80.0–100.0)
Monocytes Absolute: 0.5 10*3/uL (ref 0.1–1.0)
Monocytes Relative: 8 %
Neutro Abs: 5.2 10*3/uL (ref 1.7–7.7)
Neutrophils Relative %: 74 %
Platelets: 169 10*3/uL (ref 150–400)
RBC: 4.01 MIL/uL (ref 3.87–5.11)
RDW: 14.2 % (ref 11.5–15.5)
WBC: 7.1 10*3/uL (ref 4.0–10.5)
nRBC: 0 % (ref 0.0–0.2)

## 2019-06-25 LAB — BASIC METABOLIC PANEL
Anion gap: 6 (ref 5–15)
BUN: 12 mg/dL (ref 8–23)
CO2: 20 mmol/L — ABNORMAL LOW (ref 22–32)
Calcium: 9.4 mg/dL (ref 8.9–10.3)
Chloride: 114 mmol/L — ABNORMAL HIGH (ref 98–111)
Creatinine, Ser: 1.08 mg/dL — ABNORMAL HIGH (ref 0.44–1.00)
GFR calc Af Amer: 51 mL/min — ABNORMAL LOW (ref >60)
GFR calc non Af Amer: 44 mL/min — ABNORMAL LOW (ref >60)
Glucose, Bld: 95 mg/dL (ref 70–99)
Potassium: 4.1 mmol/L (ref 3.5–5.1)
Sodium: 140 mmol/L (ref 135–145)

## 2019-06-25 MED ORDER — FUROSEMIDE 10 MG/ML IJ SOLN
40.0000 mg | Freq: Once | INTRAMUSCULAR | Status: AC
  Administered 2019-06-25: 40 mg via INTRAVENOUS
  Filled 2019-06-25: qty 4

## 2019-06-25 MED ORDER — FUROSEMIDE 10 MG/ML IJ SOLN: 20.0000 mg | Freq: Once | INTRAMUSCULAR | Status: DC

## 2019-06-25 NOTE — TOC Initial Note (Addendum)
Transition of Care [facility]) - Initial/Assessment Note    Patient Details  Name: Stephanie Atkinson MRN: [id number] Date of Birth: Jul 23, 1924  [facility]) CM/SW Contact:    Pollie Friar, RN Phone Number: 06/25/2019, 11:44 AM  Clinical Narrative:                 Pt with recommendations for [facility] services. Pt states she lives in [place] apartment. She states her family lives close and will check in on her. Choice provided for [facility] and [facility] selected. Cory with [facility] accepted the referral.  Pt states she does her own medications at home. Pt uses family or [facility] for transportation. MD please place [facility] orders prior to admission. TOC following for further d/c needs.   Expected Discharge Plan: [place] Barriers to Discharge: Continued Medical Work up   Patient Goals and CMS Choice   CMS Medicare.gov Compare Post Acute Care list provided to:: Patient Choice offered to / list presented to : Patient  Expected Discharge Plan and Services Expected Discharge Plan: [place]   Discharge Planning Services: CM Consult Post Acute Care Choice: [place] arrangements for the past 2 months: Apartment                           HH Arranged: PT, OT HH Agency: [place] Date [place]: 06/25/19   Representative spoke with at [place]: Tommi Rumps  Prior Living Arrangements/Services Living arrangements for the past 2 months: Apartment Lives with:: Self Patient language and need for interpreter reviewed:: Yes Do you feel safe going back to the place where you live?: Yes      Need for Family Participation in Patient Care: Yes (Comment)(intermittent supervision) Care giver support system in place?: Yes (comment)(Pt states her family will check in on her) Current home services: DME(walker and cane) Criminal Activity/Legal Involvement Pertinent to Current Situation/Hospitalization: No - Comment as needed  Activities of  Daily Living Home Assistive Devices/Equipment: None ADL Screening (condition at time of admission) Patient's cognitive ability adequate to safely complete daily activities?: Yes Is the patient deaf or have difficulty hearing?: No Does the patient have difficulty seeing, even when wearing glasses/contacts?: No Does the patient have difficulty concentrating, remembering, or making decisions?: No Patient able to express need for assistance with ADLs?: Yes Does the patient have difficulty dressing or bathing?: No Independently performs ADLs?: Yes (appropriate for developmental age) Does the patient have difficulty walking or climbing stairs?: No Weakness of Legs: None Weakness of Arms/Hands: None  Permission Sought/Granted                  Emotional Assessment   Attitude/Demeanor/Rapport: Engaged Affect (typically observed): Accepting Orientation: : Oriented to Self, Oriented to Place, Oriented to  Time, Oriented to Situation   Psych Involvement: No (comment)  Admission diagnosis:  Weakness [R53.1] Community acquired bacterial pneumonia [J15.9] Community acquired pneumonia, unspecified laterality [J18.9] Patient Active Problem List   Diagnosis Date Noted  . Community acquired bacterial pneumonia 06/23/2019  . Acute kidney injury superimposed on chronic kidney disease ([place]) 06/23/2019  . Synovitis of right knee 03/02/2019  . Chronic diastolic heart failure ([place]) 09/14/2018  . Pacemaker 07/16/2017  . Atrial fibrillation with slow ventricular response ([place]) 06/27/2017  . Hypercholesterolemia 06/27/2017  . Symptomatic bradycardia 06/27/2017  . Chronic pain of right knee 05/17/2016  . De Quervain's tenosynovitis 05/17/2016  .  CAD (coronary artery disease) of artery bypass graft 02/13/2015  . Encounter for loop recorder check 02/01/2014  . Accelerated junctional rhythm 05/04/2013  . Dehydration 10/14/2012  . Syncope and collapse 10/13/2012  . History of first degree  atrioventricular block 10/11/2012  . Long term (current) use of anticoagulants 10/06/2012  . AVB - beta blocker and Amiodarone stopped 07/10/2012  . Hypertension   . Hx of CABG X 4 3/06-    . Dyslipidemia   . RCA DES placed 7/12- patent 8/14   . Permanent atrial fibrillation (HCC)   . Chronic kidney disease, stage 3    PCP:  Pearson Grippe, MD Pharmacy:   [facility] [phone] [place], [place] - [address] [place] [address] Phone: [phone] Fax: [phone]     Social Determinants of Health (SDOH) Interventions    Readmission Risk Interventions No flowsheet data found.

## 2019-06-25 NOTE — Progress Notes (Signed)
Patient ambulated while on room air and maintained oxygen saturation of 92%

## 2019-06-25 NOTE — Plan of Care (Signed)
  Problem: Education: Goal: Knowledge of General Education information will improve Description Including pain rating scale, medication(s)/side effects and non-pharmacologic comfort measures Outcome: Progressing   Problem: Health Behavior/Discharge Planning: Goal: Ability to manage health-related needs will improve Outcome: Progressing   

## 2019-06-25 NOTE — Discharge Instructions (Signed)

## 2019-06-25 NOTE — Progress Notes (Signed)
PROGRESS NOTE  Stephanie Atkinson SFK:[id number] DOB: October 26, 1924 DOA: 06/22/2019 PCP: Pearson Grippe, MD  HPI/Recap of past 24 hours: HPI from Dr Philipp Ovens is a 86 y.o. female with medical history significant for Hx of HTN, permanent atrial fibrillation on Eliquis, CAD s/p CABG, hx of syncope with pacemaker, chronic diastolic heart failure, HLD who presents with acute onset of cough and chest pain. Patient says that she went to the laundromat in her building today and then went home to have a meal.  She then suddenly developed an acute cough productive of sputum.  States the cough was so violent that it gave her midsternal chest pain as well as abdominal pain.  She also vomited some clear sputum. No worsening lower extremity edema.  Denies any fever but felt chills. Denies any sick contact.  She finished her 2 doses of Moderna Covid vaccine back in February. PTA, had a checkup with her PCP and was diagnosed with UTI and started on antibiotics. In the ED, Temp of 102.1, normotensive, on room air. WBC of 9.7, creatinine of 1.54 from a prior of 1.11, total bilirubin 1.5. BNP of 171. Troponin of 21 and then 22. CTA chest shows bilateral airspace opacity at both lung base.  Patient admitted for further management.     Today, patient noted to desat to 85% on room air but recovered up to 90%, reported feeling short of breath today, still with cough with associated chest soreness.  Denies any other new complaints.    Assessment/Plan: Active Problems:   Hypertension   Hx of CABG X 4 3/06-    Permanent atrial fibrillation (HCC)   Hypercholesterolemia   Chronic diastolic heart failure (HCC)   Community acquired bacterial pneumonia   Acute kidney injury superimposed on chronic kidney disease (HCC)   Sepsis likely 2/2 CAP On admission tachypneic, febrile Currently afebrile with resolved leukocytosis D-dimer elevated CTA chest with no PE, but showing bilateral opacities Urine strep pneumo  negative, Legionella negative Covid/influenza negative Continue ceftriaxone, Azithromycin  AKI on CKD stage IIIb/with metabolic acidosis Creatinine baseline around 1.1 Status post gentle hydration Avoid nephrotoxic's, strict I's and O's Daily BMP  Hypertension Continue amlodipine, continue to hold ACE inhibitor due to AKI  Permanent A. Fib Rate controlled Continue Eliquis History of pacemaker  CAD status post CABG Currently chest pain-free Troponin flat EKG with no acute ST changes  Chronic diastolic HF Appears euvolemic Chest x-ray on admission showed some vascular congestion, repeat pending We will give 1 dose of IV Lasix today due to worsening shortness of breath and desaturation Continue to hold home Lasix, Aldactone for now  Hypothyroidism TSH 0.835, free T4, 1.01 Continue Synthroid       Malnutrition Type:      Malnutrition Characteristics:      Nutrition Interventions:       Estimated body mass index is 30.7 kg/m as calculated from the following:   Height as of this encounter: 5\' 3"  (1.6 m).   Weight as of this encounter: 78.6 kg.     Code Status: DNR  Family Communication: Discussed extensively with patient  Disposition Plan: Status is: Inpatient  Remains inpatient appropriate because:IV treatments appropriate due to intensity of illness or inability to take PO   Dispo: The patient is from: Home              Anticipated d/c is to: Home              Anticipated d/c  date is: 1 day              Patient currently is not medically stable to d/c.  Due to desaturation as well as worsening shortness of breath.    Consultants:  None  Procedures:  None  Antimicrobials:  Ceftriaxone  Azithromycin  DVT prophylaxis: Lovenox   Objective: Vitals:   06/24/19 2000 06/25/19 0220 06/25/19 0401 06/25/19 1300  BP: (!) 134/45  138/65 135/63  Pulse:    70  Resp: (!) 23 20 (!) 21 15  Temp: 98.6 F (37 C)  98.2 F (36.8 C) 98.1 F  (36.7 C)  TempSrc: Oral  Oral Oral  SpO2: 91%  90% 91%  Weight:      Height:        Intake/Output Summary (Last 24 hours) at 06/25/2019 1549 Last data filed at 06/25/2019 0543 Gross per 24 hour  Intake --  Output 550 ml  Net -550 ml   Filed Weights   06/22/19 2351 06/24/19 0300  Weight: 74.4 kg 78.6 kg    Exam:  General: NAD   Cardiovascular: S1, S2 present  Respiratory:  Diminished breath sounds, mild bibasilar crackles  Abdomen: Soft, nontender, nondistended, bowel sounds present  Musculoskeletal: No bilateral pedal edema noted  Skin: Normal  Psychiatry: Normal mood   Data Reviewed: CBC: Recent Labs  Lab 06/22/19 2107 06/23/19 0330 06/24/19 0429 06/25/19 0402  WBC 9.7 13.8* 8.3 7.1  NEUTROABS 9.0*  --  6.9 5.2  HGB 13.1 11.6* 11.1* 11.2*  HCT 40.5 37.4 35.4* 35.8*  MCV 90.8 91.0 88.3 89.3  PLT 159 161 150 169   Basic Metabolic Panel: Recent Labs  Lab 06/22/19 2107 06/23/19 0330 06/24/19 0429 06/25/19 0402  NA 138 139 143 140  K 4.5 4.0 4.3 4.1  CL 107 109 113* 114*  CO2 21* 13* 23 20*  GLUCOSE 109* 117* 105* 95  BUN 25* 22 16 12   CREATININE 1.47* 1.43* 1.20* 1.08*  CALCIUM 10.2 9.5 9.4 9.4   GFR: Estimated Creatinine Clearance: 32.3 mL/min (A) (by C-G formula based on SCr of 1.08 mg/dL (H)). Liver Function Tests: Recent Labs  Lab 06/22/19 2107  AST 30  ALT 19  ALKPHOS 69  BILITOT 1.5*  PROT 6.3*  ALBUMIN 3.7   No results for input(s): LIPASE, AMYLASE in the last 168 hours. No results for input(s): AMMONIA in the last 168 hours. Coagulation Profile: Recent Labs  Lab 06/22/19 2349  INR 1.5*   Cardiac Enzymes: No results for input(s): CKTOTAL, CKMB, CKMBINDEX, TROPONINI in the last 168 hours. BNP (last 3 results) No results for input(s): PROBNP in the last 8760 hours. HbA1C: No results for input(s): HGBA1C in the last 72 hours. CBG: No results for input(s): GLUCAP in the last 168 hours. Lipid Profile: No results for  input(s): CHOL, HDL, LDLCALC, TRIG, CHOLHDL, LDLDIRECT in the last 72 hours. Thyroid Function Tests: Recent Labs    06/23/19 1325  TSH 0.835  FREET4 1.01   Anemia Panel: No results for input(s): VITAMINB12, FOLATE, FERRITIN, TIBC, IRON, RETICCTPCT in the last 72 hours. Urine analysis:    Component Value Date/Time   COLORURINE YELLOW 06/22/2019 0106   APPEARANCEUR CLEAR 06/22/2019 0106   LABSPEC 1.019 06/22/2019 0106   PHURINE 5.0 06/22/2019 0106   GLUCOSEU NEGATIVE 06/22/2019 0106   HGBUR NEGATIVE 06/22/2019 0106   BILIRUBINUR NEGATIVE 06/22/2019 0106   KETONESUR NEGATIVE 06/22/2019 0106   PROTEINUR NEGATIVE 06/22/2019 0106   UROBILINOGEN 1.0 10/14/2012 0922   NITRITE  NEGATIVE 06/22/2019 0106   LEUKOCYTESUR NEGATIVE 06/22/2019 0106   Sepsis Labs: @LABRCNTIP (procalcitonin:4,lacticidven:4)  ) Recent Results (from the past 240 hour(s))  Urine culture     Status: None   Collection Time: 06/22/19  1:06 AM   Specimen: Urine, Random  Result Value Ref Range Status   Specimen Description URINE, RANDOM  Final   Special Requests NONE  Final   Culture   Final    NO GROWTH Performed at [facility], [address] [place], [place]    Report Status 06/23/2019 FINAL  Final  Culture, blood (routine x 2)     Status: None (Preliminary result)   Collection Time: 06/22/19 10:30 PM   Specimen: BLOOD  Result Value Ref Range Status   Specimen Description BLOOD RIGHT ARM  Final   Special Requests   Final    BOTTLES DRAWN AEROBIC AND ANAEROBIC Blood Culture adequate volume   Culture   Final    NO GROWTH 3 DAYS Performed at [facility], [address] [place], [place]    Report Status PENDING  Incomplete  Culture, blood (routine x 2)     Status: None (Preliminary result)   Collection Time: 06/22/19 10:35 PM   Specimen: BLOOD  Result Value Ref Range Status   Specimen Description BLOOD RIGHT ARM  Final   Special Requests   Final    BOTTLES DRAWN  AEROBIC AND ANAEROBIC Blood Culture adequate volume   Culture   Final    NO GROWTH 3 DAYS Performed at [facility], [address], [place], [place]    Report Status PENDING  Incomplete  Respiratory Panel by RT PCR (Flu A&B, Covid) - Nasopharyngeal Swab     Status: None   Collection Time: 06/23/19  1:06 AM   Specimen: Nasopharyngeal Swab  Result Value Ref Range Status   SARS Coronavirus 2 by RT PCR NEGATIVE NEGATIVE Final    Comment: (NOTE) SARS-CoV-2 target nucleic acids are NOT DETECTED. The SARS-CoV-2 RNA is generally detectable in upper respiratoy specimens during the acute phase of infection. The lowest concentration of SARS-CoV-2 viral copies this assay can detect is 131 copies/mL. A negative result does not preclude SARS-Cov-2 infection and should not be used as the sole basis for treatment or other patient management decisions. A negative result may occur with  improper specimen collection/handling, submission of specimen other than nasopharyngeal swab, presence of viral mutation(s) within the areas targeted by this assay, and inadequate number of viral copies (<131 copies/mL). A negative result must be combined with clinical observations, patient history, and epidemiological information. The expected result is Negative. Fact Sheet for Patients:  06/25/19 Fact Sheet for Healthcare Providers:  https://www.moore.com/ This test is not yet ap proved or cleared by the https://www.young.biz/ FDA and  has been authorized for detection and/or diagnosis of SARS-CoV-2 by FDA under an Emergency Use Authorization (EUA). This EUA will remain  in effect (meaning this test can be used) for the duration of the COVID-19 declaration under Section 564(b)(1) of the Act, 21 U.S.C. section 360bbb-3(b)(1), unless the authorization is terminated or revoked sooner.    Influenza A by PCR NEGATIVE NEGATIVE Final   Influenza B by PCR  NEGATIVE NEGATIVE Final    Comment: (NOTE) The Xpert Xpress SARS-CoV-2/FLU/RSV assay is intended as an aid in  the diagnosis of influenza from Nasopharyngeal swab specimens and  should not be used as a sole basis for treatment. Nasal washings and  aspirates are unacceptable for Xpert Xpress SARS-CoV-2/FLU/RSV  testing. Fact Sheet for Patients: PinkCheek.be Fact Sheet for Healthcare Providers: GravelBags.it This test is not yet approved or cleared by the [place] FDA and  has been authorized for detection and/or diagnosis of SARS-CoV-2 by  FDA under an Emergency Use Authorization (EUA). This EUA will remain  in effect (meaning this test can be used) for the duration of the  Covid-19 declaration under Section 564(b)(1) of the Act, 21  U.S.C. section 360bbb-3(b)(1), unless the authorization is  terminated or revoked. Performed at [facility], [address] [place], [place] [address]       Studies: No results found.  Scheduled Meds: . amLODipine  10 mg Oral Daily  . apixaban  5 mg Oral BID  . atorvastatin  80 mg Oral QHS  . levothyroxine  50 mcg Oral QAC breakfast    Continuous Infusions: . azithromycin 500 mg (06/24/19 2217)  . cefTRIAXone (ROCEPHIN)  IV 1 g (06/24/19 2035)     LOS: 2 days     Alma Friendly, MD [facility]  If 7PM-7AM, please contact night-coverage [email or web address] 06/25/2019, 3:49 PM

## 2019-06-25 NOTE — Progress Notes (Signed)
Occupational Therapy Evaluation Patient Details Name: Stephanie Atkinson MRN: [id number] DOB: 05-11-1924 Today's Date: 06/25/2019    History of Present Illness 86 y.o. female with medical history significant for Hx of HTN, permanent atrial fibrillation on Eliquis, CAD s/p CABG, hx of syncope with pacemaker, chronic diastolic heart failure, HLD who presents with acute onset of cough and chest pain. Pt admitted for CAP.   Clinical Impression   PTA, pt lives at ILF, reports Independent with ADLs, IADLs, and mobility without AD. Pt received sitting in bed, eating breakfast with O2 removed and stats at 85%. Pt overall Supervision for bed mobility, sit to stand, and stand pivot transfers without AD. Provided min guard for mobility to/from bathroom due to some unsteadiness and pt noted to furniture walk. Pt Supervision for toileting task and hand hygiene while standing at sink. Pt did endorse SOB and seated rest break needed after this activity, but stats noted to increase to 92% on RA. Pt may benefit from [facility] follow-up at home to ensure safety and maintain independence, especially if discharging with O2.     Follow Up Recommendations  Home health OT    Equipment Recommendations  None recommended by OT    Recommendations for Other Services       Precautions / Restrictions Precautions Precautions: Fall Restrictions Weight Bearing Restrictions: No      Mobility Bed Mobility Overal bed mobility: Needs Assistance Bed Mobility: Supine to Sit     Supine to sit: Min assist;HOB elevated     General bed mobility comments: Pt requesting HHA to advance trunk, attempted cue to use bedrail  Transfers Overall transfer level: Needs assistance Equipment used: None Transfers: Sit to/from Stand;Stand Pivot Transfers Sit to Stand: Supervision Stand pivot transfers: Min guard       General transfer comment: min guard to ensure stability as pt reporting soreness in leg    Balance Overall balance  assessment: Mild deficits observed, not formally tested                                         ADL either performed or assessed with clinical judgement   ADL Overall ADL's : Needs assistance/impaired Eating/Feeding: Independent;Sitting   Grooming: Wash/dry hands;Standing;Supervision/safety Grooming Details (indicate cue type and reason): Supervision while standing at sink without AD Upper Body Bathing: Supervision/ safety;Sitting   Lower Body Bathing: Min guard;Sit to/from stand   Upper Body Dressing : Supervision/safety;Sitting   Lower Body Dressing: Min guard;Sit to/from stand   Toilet Transfer: Min guard;Ambulation;Regular Teacher, adult education Details (indicate cue type and reason): min guard to ensure safety, pt noted to furniture walk at times  Toileting- Architect and Hygiene: Supervision/safety;Sitting/lateral lean;Sit to/from stand       Functional mobility during ADLs: Min guard General ADL Comments: Pt presents with overall min guard at most for ADLs assessed due to unsteadiness on feet. Pt also with decreased endurance and strength during activity with new wearing of O2     Vision Baseline Vision/History: Wears glasses Wears Glasses: Reading only Patient Visual Report: No change from baseline       Perception     Praxis      Pertinent Vitals/Pain Pain Assessment: No/denies pain     Hand Dominance Right   Extremity/Trunk Assessment Upper Extremity Assessment Upper Extremity Assessment: Generalized weakness   Lower Extremity Assessment Lower Extremity Assessment: Defer to PT evaluation  Cervical / Trunk Assessment Cervical / Trunk Assessment: Kyphotic   Communication Communication Communication: No difficulties   Cognition Arousal/Alertness: Awake/alert Behavior During Therapy: WFL for tasks assessed/performed Overall Cognitive Status: Within Functional Limits for tasks assessed                                      General Comments       Exercises     Shoulder Instructions      Home Living Family/patient expects to be discharged to:: Private residence Living Arrangements: Alone Available Help at Discharge: Family;Available PRN/intermittently Type of Home: Independent living facility Home Access: Elevator     Home Layout: One level     Bathroom Shower/Tub: Walk-in shower;Door   [employer] Toilet: Handicapped height     Home Equipment: Shower seat - built in;Hand held shower head;Grab bars - tub/shower          Prior Functioning/Environment Level of Independence: Independent                 OT Problem List: Decreased activity tolerance;Impaired balance (sitting and/or standing);Cardiopulmonary status limiting activity      OT Treatment/Interventions: Self-care/ADL training;Therapeutic exercise;Energy conservation;Therapeutic activities;Patient/family education    OT Goals(Current goals can be found in the care plan section) Acute Rehab OT Goals Patient Stated Goal: home, move better OT Goal Formulation: With patient Time For Goal Achievement: 07/09/19 Potential to Achieve Goals: Good ADL Goals Pt Will Perform Lower Body Bathing: with modified independence;sit to/from stand;sitting/lateral leans Pt Will Perform Lower Body Dressing: with modified independence;sitting/lateral leans;sit to/from stand Pt Will Transfer to Toilet: Independently;ambulating;regular height toilet Pt Will Perform Toileting - Clothing Manipulation and hygiene: Independently;sitting/lateral leans;sit to/from stand Additional ADL Goal #1: Pt to demonstrate ability to complete 10 minutes of activity, while maintaining O2 saturations >90% in order to maximize endurance and independence with daily tasks.  OT Frequency: Min 2X/week   Barriers to D/C:            Co-evaluation              AM-PAC OT "6 Clicks" Daily Activity     Outcome Measure Help from another person eating  meals?: None Help from another person taking care of personal grooming?: A Little Help from another person toileting, which includes using toliet, bedpan, or urinal?: A Little Help from another person bathing (including washing, rinsing, drying)?: A Little Help from another person to put on and taking off regular upper body clothing?: A Little Help from another person to put on and taking off regular lower body clothing?: A Little 6 Click Score: 19   End of Session Equipment Utilized During Treatment: Gait belt;Oxygen  Activity Tolerance: Patient tolerated treatment well Patient left: in bed;with call bell/phone within reach;with bed alarm set  OT Visit Diagnosis: Muscle weakness (generalized) (M62.81);Other abnormalities of gait and mobility (R26.89)                Time: 9892-1194 OT Time Calculation (min): 35 min Charges:  OT General Charges $OT Visit: 1 Visit OT Evaluation $OT Eval Moderate Complexity: 1 Mod OT Treatments $Self Care/Home Management : 8-22 mins  Layla Maw, OTR/L  Layla Maw 06/25/2019, 10:19 AM

## 2019-06-26 ENCOUNTER — Inpatient Hospital Stay (HOSPITAL_COMMUNITY): Payer: Medicare PPO

## 2019-06-26 DIAGNOSIS — I340 Nonrheumatic mitral (valve) insufficiency: Secondary | ICD-10-CM

## 2019-06-26 DIAGNOSIS — I361 Nonrheumatic tricuspid (valve) insufficiency: Secondary | ICD-10-CM

## 2019-06-26 LAB — CBC WITH DIFFERENTIAL/PLATELET
Abs Immature Granulocytes: 0.02 10*3/uL (ref 0.00–0.07)
Basophils Absolute: 0 10*3/uL (ref 0.0–0.1)
Basophils Relative: 0 %
Eosinophils Absolute: 0.1 10*3/uL (ref 0.0–0.5)
Eosinophils Relative: 2 %
HCT: 36.7 % (ref 36.0–46.0)
Hemoglobin: 11.6 g/dL — ABNORMAL LOW (ref 12.0–15.0)
Immature Granulocytes: 0 %
Lymphocytes Relative: 18 %
Lymphs Abs: 1 10*3/uL (ref 0.7–4.0)
MCH: 27 pg (ref 26.0–34.0)
MCHC: 31.6 g/dL (ref 30.0–36.0)
MCV: 85.5 fL (ref 80.0–100.0)
Monocytes Absolute: 0.4 10*3/uL (ref 0.1–1.0)
Monocytes Relative: 8 %
Neutro Abs: 3.9 10*3/uL (ref 1.7–7.7)
Neutrophils Relative %: 72 %
Platelets: 191 10*3/uL (ref 150–400)
RBC: 4.29 MIL/uL (ref 3.87–5.11)
RDW: 13.8 % (ref 11.5–15.5)
WBC: 5.5 10*3/uL (ref 4.0–10.5)
nRBC: 0 % (ref 0.0–0.2)

## 2019-06-26 LAB — BASIC METABOLIC PANEL
Anion gap: 8 (ref 5–15)
BUN: 11 mg/dL (ref 8–23)
CO2: 22 mmol/L (ref 22–32)
Calcium: 9.8 mg/dL (ref 8.9–10.3)
Chloride: 112 mmol/L — ABNORMAL HIGH (ref 98–111)
Creatinine, Ser: 1.1 mg/dL — ABNORMAL HIGH (ref 0.44–1.00)
GFR calc Af Amer: 50 mL/min — ABNORMAL LOW (ref >60)
GFR calc non Af Amer: 43 mL/min — ABNORMAL LOW (ref >60)
Glucose, Bld: 94 mg/dL (ref 70–99)
Potassium: 3.6 mmol/L (ref 3.5–5.1)
Sodium: 142 mmol/L (ref 135–145)

## 2019-06-26 LAB — ECHOCARDIOGRAM COMPLETE
Height: 63 in
Weight: 2772.5 oz

## 2019-06-26 MED ORDER — FUROSEMIDE 10 MG/ML IJ SOLN
20.0000 mg | Freq: Once | INTRAMUSCULAR | Status: AC
  Administered 2019-06-26: 20 mg via INTRAVENOUS
  Filled 2019-06-26: qty 2

## 2019-06-26 MED ORDER — FUROSEMIDE 10 MG/ML IJ SOLN: 40.0000 mg | Freq: Every day | INTRAMUSCULAR | Status: DC

## 2019-06-26 MED ORDER — FUROSEMIDE 10 MG/ML IJ SOLN
20.0000 mg | Freq: Every day | INTRAMUSCULAR | Status: DC
  Administered 2019-06-27: 08:00:00 20 mg via INTRAVENOUS
  Filled 2019-06-26: qty 2

## 2019-06-26 NOTE — Progress Notes (Signed)
PROGRESS NOTE  Stephanie Atkinson TSV:[id number] DOB: Sep 07, 1924 DOA: 06/22/2019 PCP: Pearson Grippe, MD  HPI/Recap of past 24 hours: HPI from Dr Philipp Ovens is a 86 y.o. female with medical history significant for Hx of HTN, permanent atrial fibrillation on Eliquis, CAD s/p CABG, hx of syncope with pacemaker, chronic diastolic heart failure, HLD who presents with acute onset of cough and chest pain. Patient says that she went to the laundromat in her building today and then went home to have a meal.  She then suddenly developed an acute cough productive of sputum.  States the cough was so violent that it gave her midsternal chest pain as well as abdominal pain.  She also vomited some clear sputum. No worsening lower extremity edema.  Denies any fever but felt chills. Denies any sick contact.  She finished her 2 doses of Moderna Covid vaccine back in February. PTA, had a checkup with her PCP and was diagnosed with UTI and started on antibiotics. In the ED, Temp of 102.1, normotensive, on room air. WBC of 9.7, creatinine of 1.54 from a prior of 1.11, total bilirubin 1.5. BNP of 171. Troponin of 21 and then 22. CTA chest shows bilateral airspace opacity at both lung base.  Patient admitted for further management.     Today, after ambulation with RN, spoke to patient's who reported still feeling short of breath, little dizzy, denies any chest pain, abdominal pain, nausea/vomiting, fever/chills.  Discussed extensively with patient and daughter about further management upon discharge.  Patient lives alone, with her older children who are in the 63s in the same apartment building.  Patient does have a cleaning, cooking and laundry herself.  Daughter states she does not know her limits and can overdo it sometimes.  Will order home health RN, aide, PT/OT to assist patient during the recovery phase.    Assessment/Plan: Active Problems:   Hypertension   Hx of CABG X 4 3/06-    Permanent atrial fibrillation  (HCC)   Hypercholesterolemia   Chronic diastolic heart failure (HCC)   Community acquired bacterial pneumonia   Acute kidney injury superimposed on chronic kidney disease (HCC)   Sepsis likely 2/2 CAP On admission tachypneic, febrile Currently afebrile with resolved leukocytosis D-dimer elevated CTA chest with no PE, but showing bilateral opacities Urine strep pneumo negative, Legionella negative Covid/influenza negative Continue ceftriaxone, Azithromycin  AKI on CKD stage IIIb/with metabolic acidosis Improved Creatinine baseline around 1.1 Status post gentle hydration Avoid nephrotoxic's, strict I's and O's Daily BMP  Acute on chronic diastolic HF Dyspnea Chest x-ray on admission showed some vascular congestion, repeat showed persistent vascular congestion Continue IV Lasix Continue to hold home Lasix, Aldactone for now  Hypertension Continue amlodipine, continue to hold ACE inhibitor due to AKI  Permanent A. Fib Rate controlled Continue Eliquis History of pacemaker  CAD status post CABG Currently chest pain-free Troponin flat EKG with no acute ST changes  Hypothyroidism TSH 0.835, free T4, 1.01 Continue Synthroid       Malnutrition Type:      Malnutrition Characteristics:      Nutrition Interventions:       Estimated body mass index is 30.7 kg/m as calculated from the following:   Height as of this encounter: 5\' 3"  (1.6 m).   Weight as of this encounter: 78.6 kg.     Code Status: DNR  Family Communication: Discussed extensively with patient and daughter on 06/26/2019  Disposition Plan: Status is: Inpatient  Remains inpatient  appropriate because:IV treatments appropriate due to intensity of illness or inability to take PO   Dispo: The patient is from: Home              Anticipated d/c is to: Home              Anticipated d/c date is: 06/27/2019              Patient currently is not medically stable to d/c.  Due to desaturation as well as  worsening shortness of breath, requiring another day of diuresis    Consultants:  None  Procedures:  None  Antimicrobials:  Ceftriaxone  Azithromycin  DVT prophylaxis: Lovenox   Objective: Vitals:   06/26/19 0400 06/26/19 0624 06/26/19 0746 06/26/19 1208  BP: (!) 143/59  (!) 142/65 (!) 146/62  Pulse:  60 61 61  Resp: 20  20 17   Temp: 97.7 F (36.5 C)  98.3 F (36.8 C) 97.6 F (36.4 C)  TempSrc: Oral  Oral Oral  SpO2: 93% 93% 96% 96%  Weight:      Height:        Intake/Output Summary (Last 24 hours) at 06/26/2019 1701 Last data filed at 06/26/2019 1018 Gross per 24 hour  Intake 910 ml  Output 3100 ml  Net -2190 ml   Filed Weights   06/22/19 2351 06/24/19 0300  Weight: 74.4 kg 78.6 kg    Exam:  General: NAD   Cardiovascular: S1, S2 present  Respiratory:  Diminished breath sounds, mild bibasilar crackles  Abdomen: Soft, nontender, nondistended, bowel sounds present  Musculoskeletal: No bilateral pedal edema noted  Skin: Normal  Psychiatry: Normal mood   Data Reviewed: CBC: Recent Labs  Lab 06/22/19 2107 06/23/19 0330 06/24/19 0429 06/25/19 0402 06/26/19 0417  WBC 9.7 13.8* 8.3 7.1 5.5  NEUTROABS 9.0*  --  6.9 5.2 3.9  HGB 13.1 11.6* 11.1* 11.2* 11.6*  HCT 40.5 37.4 35.4* 35.8* 36.7  MCV 90.8 91.0 88.3 89.3 85.5  PLT 159 161 150 169 191   Basic Metabolic Panel: Recent Labs  Lab 06/22/19 2107 06/23/19 0330 06/24/19 0429 06/25/19 0402 06/26/19 0417  NA 138 139 143 140 142  K 4.5 4.0 4.3 4.1 3.6  CL 107 109 113* 114* 112*  CO2 21* 13* 23 20* 22  GLUCOSE 109* 117* 105* 95 94  BUN 25* 22 16 12 11   CREATININE 1.47* 1.43* 1.20* 1.08* 1.10*  CALCIUM 10.2 9.5 9.4 9.4 9.8   GFR: Estimated Creatinine Clearance: 31.7 mL/min (A) (by C-G formula based on SCr of 1.1 mg/dL (H)). Liver Function Tests: Recent Labs  Lab 06/22/19 2107  AST 30  ALT 19  ALKPHOS 69  BILITOT 1.5*  PROT 6.3*  ALBUMIN 3.7   No results for input(s):  LIPASE, AMYLASE in the last 168 hours. No results for input(s): AMMONIA in the last 168 hours. Coagulation Profile: Recent Labs  Lab 06/22/19 2349  INR 1.5*   Cardiac Enzymes: No results for input(s): CKTOTAL, CKMB, CKMBINDEX, TROPONINI in the last 168 hours. BNP (last 3 results) No results for input(s): PROBNP in the last 8760 hours. HbA1C: No results for input(s): HGBA1C in the last 72 hours. CBG: No results for input(s): GLUCAP in the last 168 hours. Lipid Profile: No results for input(s): CHOL, HDL, LDLCALC, TRIG, CHOLHDL, LDLDIRECT in the last 72 hours. Thyroid Function Tests: No results for input(s): TSH, T4TOTAL, FREET4, T3FREE, THYROIDAB in the last 72 hours. Anemia Panel: No results for input(s): VITAMINB12, FOLATE, FERRITIN, TIBC, IRON,  RETICCTPCT in the last 72 hours. Urine analysis:    Component Value Date/Time   COLORURINE YELLOW 06/22/2019 0106   APPEARANCEUR CLEAR 06/22/2019 0106   LABSPEC 1.019 06/22/2019 0106   PHURINE 5.0 06/22/2019 0106   GLUCOSEU NEGATIVE 06/22/2019 0106   HGBUR NEGATIVE 06/22/2019 0106   BILIRUBINUR NEGATIVE 06/22/2019 0106   KETONESUR NEGATIVE 06/22/2019 0106   PROTEINUR NEGATIVE 06/22/2019 0106   UROBILINOGEN 1.0 10/14/2012 0922   NITRITE NEGATIVE 06/22/2019 0106   LEUKOCYTESUR NEGATIVE 06/22/2019 0106   Sepsis Labs: (procalcitonin:4,lacticidven:4)  ) Recent Results (from the past 240 hour(s))  Urine culture     Status: None   Collection Time: 06/22/19  1:06 AM   Specimen: Urine, Random  Result Value Ref Range Status   Specimen Description URINE, RANDOM  Final   Special Requests NONE  Final   Culture   Final    NO GROWTH Performed at [facility], [address] [place], [place] [address]    Report Status 06/23/2019 FINAL  Final  Culture, blood (routine x 2)     Status: None (Preliminary result)   Collection Time: 06/22/19 10:30 PM   Specimen: BLOOD  Result Value Ref Range Status   Specimen Description  BLOOD RIGHT ARM  Final   Special Requests   Final    BOTTLES DRAWN AEROBIC AND ANAEROBIC Blood Culture adequate volume   Culture   Final    NO GROWTH 4 DAYS Performed at [facility], [address] [place], [place] [address]    Report Status PENDING  Incomplete  Culture, blood (routine x 2)     Status: None (Preliminary result)   Collection Time: 06/22/19 10:35 PM   Specimen: BLOOD  Result Value Ref Range Status   Specimen Description BLOOD RIGHT ARM  Final   Special Requests   Final    BOTTLES DRAWN AEROBIC AND ANAEROBIC Blood Culture adequate volume   Culture   Final    NO GROWTH 4 DAYS Performed at [facility], [address], [place], [place] [address]    Report Status PENDING  Incomplete  Respiratory Panel by RT PCR (Flu A&B, Covid) - Nasopharyngeal Swab     Status: None   Collection Time: 06/23/19  1:06 AM   Specimen: Nasopharyngeal Swab  Result Value Ref Range Status   SARS Coronavirus 2 by RT PCR NEGATIVE NEGATIVE Final    Comment: (NOTE) SARS-CoV-2 target nucleic acids are NOT DETECTED. The SARS-CoV-2 RNA is generally detectable in upper respiratoy specimens during the acute phase of infection. The lowest concentration of SARS-CoV-2 viral copies this assay can detect is 131 copies/mL. A negative result does not preclude SARS-Cov-2 infection and should not be used as the sole basis for treatment or other patient management decisions. A negative result may occur with  improper specimen collection/handling, submission of specimen other than nasopharyngeal swab, presence of viral mutation(s) within the areas targeted by this assay, and inadequate number of viral copies (<131 copies/mL). A negative result must be combined with clinical observations, patient history, and epidemiological information. The expected result is Negative. Fact Sheet for Patients:  https://www.moore.com/ Fact Sheet for Healthcare Providers:   https://www.young.biz/ This test is not yet ap proved or cleared by the [place] FDA and  has been authorized for detection and/or diagnosis of SARS-CoV-2 by FDA under an Emergency Use Authorization (EUA). This EUA will remain  in effect (meaning this test can be used) for the duration of the COVID-19 declaration under  Section 564(b)(1) of the Act, 21 U.S.C. section 360bbb-3(b)(1), unless the authorization is terminated or revoked sooner.    Influenza A by PCR NEGATIVE NEGATIVE Final   Influenza B by PCR NEGATIVE NEGATIVE Final    Comment: (NOTE) The Xpert Xpress SARS-CoV-2/FLU/RSV assay is intended as an aid in  the diagnosis of influenza from Nasopharyngeal swab specimens and  should not be used as a sole basis for treatment. Nasal washings and  aspirates are unacceptable for Xpert Xpress SARS-CoV-2/FLU/RSV  testing. Fact Sheet for Patients: https://www.moore.com/ Fact Sheet for Healthcare Providers: https://www.young.biz/ This test is not yet approved or cleared by the [place] FDA and  has been authorized for detection and/or diagnosis of SARS-CoV-2 by  FDA under an Emergency Use Authorization (EUA). This EUA will remain  in effect (meaning this test can be used) for the duration of the  Covid-19 declaration under Section 564(b)(1) of the Act, 21  U.S.C. section 360bbb-3(b)(1), unless the authorization is  terminated or revoked. Performed at [facility], [address] [place], [place] [address]       Studies: ECHOCARDIOGRAM COMPLETE  Result Date: 06/26/2019    ECHOCARDIOGRAM REPORT   Patient Name:   Stephanie Atkinson Date of Exam: 06/26/2019 Medical Rec #:  [id number]     Height:       63.0 in Accession #:    [id number]    Weight:       173.3 lb Date of Birth:  05/08/1924     BSA:          1.819 m Patient Age:    89 years      BP:           142/65 mmHg Patient Gender: F             HR:           61 bpm.  Exam Location:  Inpatient Procedure: 2D Echo Indications:    Dyspnea 786.09 / R06.00  History:        Patient has prior history of Echocardiogram examinations, most                 recent 09/27/2010. Prior CABG and Pacemaker, Arrythmias:Atrial                 Fibrillation, Signs/Symptoms:Syncope; Risk Factors:Hypertension,                 Dyslipidemia and Non-Smoker.  Sonographer:    Jeryl Columbia Referring Phys: [id number] Clarissia Mckeen J Anyeli Hockenbury IMPRESSIONS  1. Septal dyssynergy due to ventricular paciing; biatrial enlargement; mild MR and TR.  2. Left ventricular ejection fraction, by estimation, is 50 to 55%. The left ventricle has low normal function. The left ventricle has no regional wall motion abnormalities. Left ventricular diastolic parameters are indeterminate. Elevated left atrial pressure.  3. Right ventricular systolic function is normal. The right ventricular size is normal. There is mildly elevated pulmonary artery systolic pressure.  4. Left atrial size was severely dilated.  5. Right atrial size was moderately dilated.  6. The mitral valve is normal in structure. Mild mitral valve regurgitation. No evidence of mitral stenosis.  7. The aortic valve is tricuspid. Aortic valve regurgitation is not visualized. Mild aortic valve sclerosis is present, with no evidence of aortic valve stenosis.  8. The inferior vena cava is normal in size with greater than 50% respiratory variability, suggesting right atrial pressure of 3 mmHg. FINDINGS  Left Ventricle: Left ventricular ejection fraction, by estimation,  is 50 to 55%. The left ventricle has low normal function. The left ventricle has no regional wall motion abnormalities. The left ventricular internal cavity size was normal in size. There is no left ventricular hypertrophy. Left ventricular diastolic parameters are indeterminate. Elevated left atrial pressure. Right Ventricle: The right ventricular size is normal.Right ventricular systolic function is normal.  There is mildly elevated pulmonary artery systolic pressure. The tricuspid regurgitant velocity is 3.09 m/s, and with an assumed right atrial pressure of  3 mmHg, the estimated right ventricular systolic pressure is 41.2 mmHg. Left Atrium: Left atrial size was severely dilated. Right Atrium: Right atrial size was moderately dilated. Pericardium: There is no evidence of pericardial effusion. Mitral Valve: The mitral valve is normal in structure. Normal mobility of the mitral valve leaflets. Mild mitral annular calcification. Mild mitral valve regurgitation. No evidence of mitral valve stenosis. Tricuspid Valve: The tricuspid valve is normal in structure. Tricuspid valve regurgitation is mild . No evidence of tricuspid stenosis. Aortic Valve: The aortic valve is tricuspid. Aortic valve regurgitation is not visualized. Mild aortic valve sclerosis is present, with no evidence of aortic valve stenosis. Pulmonic Valve: The pulmonic valve was normal in structure. Pulmonic valve regurgitation is mild. No evidence of pulmonic stenosis. Aorta: The aortic root is normal in size and structure. Venous: The inferior vena cava is normal in size with greater than 50% respiratory variability, suggesting right atrial pressure of 3 mmHg.  Additional Comments: Septal dyssynergy due to ventricular paciing; biatrial enlargement; mild MR and TR. A pacer wire is visualized.  LEFT VENTRICLE PLAX 2D LVIDd:         5.20 cm  Diastology LVIDs:         4.10 cm  LV e' lateral:   9.55 cm/s LV PW:         1.30 cm  LV E/e' lateral: 10.6 LV IVS:        1.10 cm  LV e' medial:    4.47 cm/s LVOT diam:     1.90 cm  LV E/e' medial:  22.6 LVOT Area:     2.84 cm  RIGHT VENTRICLE RV S prime:     7.63 cm/s TAPSE (M-mode): 1.3 cm LEFT ATRIUM              Index       RIGHT ATRIUM           Index LA diam:        5.20 cm  2.86 cm/m  RA Area:     23.20 cm LA Vol (A2C):   103.0 ml 56.61 ml/m RA Volume:   75.10 ml  41.28 ml/m LA Vol (A4C):   132.0 ml 72.55  ml/m LA Biplane Vol: 121.0 ml 66.51 ml/m   AORTA Ao Root diam: 2.50 cm MITRAL VALVE                TRICUSPID VALVE MV Area (PHT): 2.91 cm     TR Peak grad:   38.2 mmHg MV Decel Time: 261 msec     TR Vmax:        309.00 cm/s MV E velocity: 101.00 cm/s MV A velocity: 30.00 cm/s   SHUNTS MV E/A ratio:  3.37         Systemic Diam: 1.90 cm Olga MillersBrian Crenshaw MD Electronically signed by Olga MillersBrian Crenshaw MD Signature Date/Time: 06/26/2019/12:47:45 PM    Final     Scheduled Meds: . amLODipine  10 mg Oral Daily  . apixaban  5 mg  Oral BID  . atorvastatin  80 mg Oral QHS  . levothyroxine  50 mcg Oral QAC breakfast    Continuous Infusions: . azithromycin 500 mg (06/25/19 2150)  . cefTRIAXone (ROCEPHIN)  IV 1 g (06/25/19 2328)     LOS: 3 days     Alma Friendly, MD [facility]  If 7PM-7AM, please contact night-coverage [email or web address] 06/26/2019, 5:01 PM

## 2019-06-26 NOTE — Progress Notes (Signed)
Patient oxygen saturations dropping down to 85-86% while sleeping.Oxygen at 2 liters placed per nasal canula to maintain oxygen saturations 93-94%.

## 2019-06-26 NOTE — Progress Notes (Signed)
Patient ambulated in the hallway on room air for a total of 90 feet and oxygen saturation dropped to 87 with a quick return to the mid 90's.  Patient had no complaints while ambulating.

## 2019-06-26 NOTE — Progress Notes (Signed)
*  PRELIMINARY RESULTS* Echocardiogram 2D Echocardiogram has been performed.  Stephanie Atkinson 06/26/2019, 11:46 AM

## 2019-06-27 LAB — CBC WITH DIFFERENTIAL/PLATELET
Abs Immature Granulocytes: 0.02 10*3/uL (ref 0.00–0.07)
Basophils Absolute: 0 10*3/uL (ref 0.0–0.1)
Basophils Relative: 1 %
Eosinophils Absolute: 0.1 10*3/uL (ref 0.0–0.5)
Eosinophils Relative: 3 %
HCT: 38.9 % (ref 36.0–46.0)
Hemoglobin: 12.8 g/dL (ref 12.0–15.0)
Immature Granulocytes: 1 %
Lymphocytes Relative: 23 %
Lymphs Abs: 1 10*3/uL (ref 0.7–4.0)
MCH: 28.6 pg (ref 26.0–34.0)
MCHC: 32.9 g/dL (ref 30.0–36.0)
MCV: 87 fL (ref 80.0–100.0)
Monocytes Absolute: 0.3 10*3/uL (ref 0.1–1.0)
Monocytes Relative: 8 %
Neutro Abs: 2.8 10*3/uL (ref 1.7–7.7)
Neutrophils Relative %: 64 %
Platelets: 214 10*3/uL (ref 150–400)
RBC: 4.47 MIL/uL (ref 3.87–5.11)
RDW: 13.5 % (ref 11.5–15.5)
WBC: 4.3 10*3/uL (ref 4.0–10.5)
nRBC: 0 % (ref 0.0–0.2)

## 2019-06-27 LAB — CULTURE, BLOOD (ROUTINE X 2)
Culture: NO GROWTH
Culture: NO GROWTH
Special Requests: ADEQUATE
Special Requests: ADEQUATE

## 2019-06-27 LAB — BASIC METABOLIC PANEL
Anion gap: 8 (ref 5–15)
BUN: 11 mg/dL (ref 8–23)
CO2: 22 mmol/L (ref 22–32)
Calcium: 9.9 mg/dL (ref 8.9–10.3)
Chloride: 111 mmol/L (ref 98–111)
Creatinine, Ser: 1.09 mg/dL — ABNORMAL HIGH (ref 0.44–1.00)
GFR calc Af Amer: 51 mL/min — ABNORMAL LOW (ref >60)
GFR calc non Af Amer: 44 mL/min — ABNORMAL LOW (ref >60)
Glucose, Bld: 95 mg/dL (ref 70–99)
Potassium: 3.8 mmol/L (ref 3.5–5.1)
Sodium: 141 mmol/L (ref 135–145)

## 2019-06-27 MED ORDER — CEFDINIR 300 MG PO CAPS
300.0000 mg | ORAL_CAPSULE | Freq: Two times a day (BID) | ORAL | 0 refills | Status: AC
Start: 2019-06-27 — End: 2019-06-30

## 2019-06-27 MED ORDER — ACETAMINOPHEN 500 MG PO TABS: 500.0000 mg | ORAL_TABLET | Freq: Four times a day (QID) | ORAL | 0 refills | Status: DC | PRN

## 2019-06-27 MED ORDER — FUROSEMIDE 20 MG PO TABS: 20.0000 mg | ORAL_TABLET | Freq: Every day | ORAL | 11 refills | Status: DC

## 2019-06-27 NOTE — Discharge Summary (Signed)
Discharge Summary  Stephanie Atkinson:[id number] DOB: 03/22/1924  PCP: Pearson Grippe, MD  Admit date: 06/22/2019 Discharge date: 06/27/2019  Time spent: 40 mins  Recommendations for Outpatient Follow-up:  1. PCP in 1 week with repeat labs and follow up  Discharge Diagnoses:  Active Hospital Problems   Diagnosis Date Noted  . Community acquired bacterial pneumonia 06/23/2019  . Acute kidney injury superimposed on chronic kidney disease (HCC) 06/23/2019  . Chronic diastolic heart failure (HCC) 09/14/2018  . Hypercholesterolemia 06/27/2017  . Hypertension   . Hx of CABG X 4 3/06-    . Permanent atrial fibrillation [facility])     Resolved Hospital Problems  No resolved problems to display.    Discharge Condition: Stable  Diet recommendation: Heart healthy  Vitals:   06/27/19 0333 06/27/19 0500  BP:  (!) 134/47  Pulse:  60  Resp: (!) 21 18  Temp:  98.7 F (37.1 C)  SpO2:  93%    History of present illness:  Stephanie Atkinson a 86 y.o.femalewith medical history significant forHx of HTN, permanent atrial fibrillation on Eliquis, CAD s/p CABG, hx of syncope with pacemaker, chronic diastolic heart failure, HLDwhopresents with acute onset of cough and chest pain. Patient says that she went to the laundromat in her building today and then went home to have a meal. She thensuddenly developed an acute cough productive of sputum. States the cough was so violent that it gave her midsternal chest pain as well as abdominal pain. She also vomited some clear sputum. No worsening lower extremity edema. Denies any fever but felt chills. Denies any sick contact. She finished her 2 doses of ModernaCovid vaccine back in February. PTA, had a checkup with her PCP and was diagnosed with UTI and started on antibiotics. In the ED, Temp of 102.1, normotensive, on room air. WBC of 9.7, creatinine of 1.54 from a prior of 1.11, total bilirubin 1.5. BNP of 171. Troponin of 21 and then 22. CTA chest shows  bilateral airspace opacity at both lung base.  Patient admitted for further management.    Today, patient denies any new complaints, feeling better overall compared to when she first came in.  Cough and shortness of breath is improved, and denies any chest pain, abdominal pain, nausea/vomiting, fever/chills. Discussed extensively with patient and daughter on 06/26/19, about further management upon discharge.  Patient lives alone, with her older children who are in the 41s in the same apartment building.  Patient does her cleaning, cooking and laundry herself.  Daughter states she does not know her limits and can overdo it sometimes. Ordered home health RN, aide, PT/OT to assist patient during the recovery phase.   Hospital Course:  Active Problems:   Hypertension   Hx of CABG X 4 3/06-    Permanent atrial fibrillation (HCC)   Hypercholesterolemia   Chronic diastolic heart failure (HCC)   Community acquired bacterial pneumonia   Acute kidney injury superimposed on chronic kidney disease (HCC)   Sepsis likely 2/2 CAP On admission tachypneic, febrile Currently afebrile with resolved leukocytosis D-dimer elevated CTA chest with no PE, but showing bilateral opacities Urine strep pneumo negative, Legionella negative Covid/influenza negative S/P ceftriaxone, Azithromycin, will discharge patient on p.o. cefdinir to complete 7 days of antibiotics Follow-up with PCP  AKI on CKD stage IIIb/with metabolic acidosis Improved Creatinine baseline around 1.1 Status post gentle hydration Follow-up with PCP  Acute on chronic diastolic HF Dyspnea BNP 171 Chest x-ray on admission showed some vascular congestion, repeat showed  some vascular congestion S/p IV Lasix Instructed patient to restart home Lasix and take it daily, continue Aldactone Follow-up with PCP with repeat labs, possible Lasix adjustment  Hypertension Continue home amlodipine, valsartan  Permanent A. Fib Rate  controlled Continue Eliquis History of pacemaker  CAD status post CABG Currently chest pain-free Troponin flat EKG with no acute ST changes  Hypothyroidism TSH 0.835, free T4, 1.01 Continue Synthroid          Malnutrition Type:      Malnutrition Characteristics:      Nutrition Interventions:      Estimated body mass index is 29.95 kg/m as calculated from the following:   Height as of this encounter: 5\' 3"  (1.6 m).   Weight as of this encounter: 76.7 kg.    Procedures:  None  Consultations:  None  Discharge Exam: BP (!) 134/47 (BP Location: Left Arm)   Pulse 60   Temp 98.7 F (37.1 C) (Oral)   Resp 18   Ht 5\' 3"  (1.6 m)   Wt 76.7 kg   SpO2 93%   BMI 29.95 kg/m   General: NAD, alert, oriented Cardiovascular: S1, S2 present Respiratory: Diminished breath sounds bilaterally  Discharge Instructions You were cared for by a hospitalist during your hospital stay. If you have any questions about your discharge medications or the care you received while you were in the hospital after you are discharged, you can call the unit and asked to speak with the hospitalist on call if the hospitalist that took care of you is not available. Once you are discharged, your primary care physician will handle any further medical issues. Please note that NO REFILLS for any discharge medications will be authorized once you are discharged, as it is imperative that you return to your primary care physician (or establish a relationship with a primary care physician if you do not have one) for your aftercare needs so that they can reassess your need for medications and monitor your lab values.  Discharge Instructions    Diet - low sodium heart healthy   Complete by: As directed    Increase activity slowly   Complete by: As directed      Allergies as of 06/27/2019      Reactions   Penicillins Hives   Did it involve swelling of the face/tongue/throat, SOB, or low BP?  No Did it involve sudden or severe rash/hives, skin peeling, or any reaction on the inside of your mouth or nose? No Did you need to seek medical attention at a hospital or doctor's office? No When did it last happen? Unk If all above answers are "NO", may proceed with cephalosporin use.   Sulfa Antibiotics Hives, Swelling      Medication List    STOP taking these medications   methocarbamol 500 MG tablet Commonly known as: ROBAXIN   nitrofurantoin 100 MG capsule Commonly known as: MACRODANTIN     TAKE these medications   acetaminophen 500 MG tablet Commonly known as: TYLENOL Take 1 tablet (500 mg total) by mouth every 6 (six) hours as needed for moderate pain. What changed: how much to take   amLODipine 10 MG tablet Commonly known as: NORVASC TAKE 1 TABLET BY MOUTH EVERY DAY   Bone Density 300-200 MG-UNIT Tabs Take 1 tablet by mouth daily.   cefdinir 300 MG capsule Commonly known as: OMNICEF Take 1 capsule (300 mg total) by mouth 2 (two) times daily for 3 days.   cholecalciferol 25 MCG (1000 UNIT)  tablet Commonly known as: VITAMIN D3 Take 1,000 Units by mouth daily.   clobetasol cream 0.05 % Commonly known as: TEMOVATE Apply 1 application topically 3 (three) times daily as needed (to affected areas of legs).   diphenhydrAMINE 25 MG tablet Commonly known as: BENADRYL Take 25 mg by mouth every 6 (six) hours as needed for allergies.   Eliquis 5 MG Tabs tablet Generic drug: apixaban TAKE 1 TABLET BY MOUTH TWICE A DAY What changed: how much to take   furosemide 20 MG tablet Commonly known as: LASIX Take 1 tablet (20 mg total) by mouth daily. What changed: when to take this   hydroxypropyl methylcellulose / hypromellose 2.5 % ophthalmic solution Commonly known as: ISOPTO TEARS / GONIOVISC Place 1 drop into both eyes 3 (three) times daily as needed for dry eyes.   levothyroxine 50 MCG tablet Commonly known as: SYNTHROID Take 50 mcg by mouth daily before  breakfast.   lidocaine 5 % Commonly known as: Lidoderm Place 1 patch onto the skin daily. Remove & Discard patch within 12 hours or as directed by MD What changed:   when to take this  reasons to take this  additional instructions   Lipitor 80 MG tablet Generic drug: atorvastatin Take 80 mg by mouth at bedtime.   nitroGLYCERIN 0.4 MG SL tablet Commonly known as: NITROSTAT Place 0.4 mg under the tongue every 5 (five) minutes as needed for chest pain.   spironolactone 25 MG tablet Commonly known as: ALDACTONE Take 0.5 tablets (12.5 mg total) by mouth daily. Please note dosage change. Patient will call when she needs a refill What changed: additional instructions   traMADol 50 MG tablet Commonly known as: ULTRAM Take 50 mg by mouth every 6 (six) hours as needed (for pain).   valsartan 160 MG tablet Commonly known as: DIOVAN Take 160 mg by mouth daily.      Allergies  Allergen Reactions  . Penicillins Hives    Did it involve swelling of the face/tongue/throat, SOB, or low BP? No Did it involve sudden or severe rash/hives, skin peeling, or any reaction on the inside of your mouth or nose? No Did you need to seek medical attention at a hospital or doctor's office? No When did it last happen? Unk If all above answers are "NO", may proceed with cephalosporin use.   . Sulfa Antibiotics Hives and Swelling   Follow-up Information    [facility] Follow up.   Specialty: Home Health Services Why: The home health agency will contact you for the first home visit. Contact information: [address] [place] [address] [phone]        Pearson Grippe, MD. Schedule an appointment as soon as possible for a visit in 1 week(s).   Specialty: Internal Medicine Contact information: [address] [place] [address] [place] [address] [phone]        Runell Gess, MD .   Specialties: Cardiology, Radiology Contact information: [address] [place] [address] [phone]            The results of significant diagnostics from this hospitalization (including imaging, microbiology, ancillary and laboratory) are listed below for reference.    Significant Diagnostic Studies: DG Chest 2 View  Result Date: 06/22/2019 CLINICAL DATA:  Chest pain EXAM: CHEST - 2 VIEW COMPARISON:  04/17/2018 FINDINGS: Moderate cardiomegaly. Unchanged position of left chest wall pacemaker. Remote median sternotomy and CABG. There is bibasilar atelectasis. Mild interstitial opacity without overt  pulmonary edema. Pulmonary vascular congestion. No sizable pleural effusion. IMPRESSION: Cardiomegaly and pulmonary vascular congestion without overt pulmonary edema. Electronically Signed   By: Deatra Robinson M.D.   On: 06/22/2019 21:18   CT Angio Chest PE W/Cm &/Or Wo Cm  Result Date: 06/22/2019 CLINICAL DATA:  Chest pain shortness of breath EXAM: CT ANGIOGRAPHY CHEST WITH CONTRAST TECHNIQUE: Multidetector CT imaging of the chest was performed using the standard protocol during bolus administration of intravenous contrast. Multiplanar CT image reconstructions and MIPs were obtained to evaluate the vascular anatomy. CONTRAST:  75mL OMNIPAQUE IOHEXOL 350 MG/ML SOLN COMPARISON:  None. FINDINGS: Cardiovascular: There is a optimal opacification of the pulmonary arteries. There is no central,segmental, or subsegmental filling defects within the pulmonary arteries. There is moderate cardiomegaly. No pericardial effusion or thickening. No evidence right heart strain. There is normal three-vessel brachiocephalic anatomy without proximal stenosis. Scattered aortic atherosclerosis is seen. Coronary artery calcifications are seen. A left-sided pacemaker seen with the lead tips in the right atrium and right ventricle. Mediastinum/Nodes: No hilar, mediastinal, or axillary adenopathy. Thyroid gland, trachea, and esophagus demonstrate no significant findings.  Lungs/Pleura: Patchy airspace opacities are seen at both lung bases. No pleural effusions are noted. Upper Abdomen: No acute abnormalities present in the visualized portions of the upper abdomen. Musculoskeletal: No chest wall abnormality. No acute or significant osseous findings. Overlying median sternotomy wires are present. Review of the MIP images confirms the above findings. IMPRESSION: No central, segmental, or subsegmental pulmonary embolism. Bilateral patchy airspace opacities at both lung bases which could be due to atelectasis or infectious etiology. Aortic Atherosclerosis (ICD10-I70.0). Electronically Signed   By: Jonna Clark M.D.   On: 06/22/2019 23:49   DG Chest Port 1 View  Result Date: 06/25/2019 CLINICAL DATA:  Shortness of breath and cough EXAM: PORTABLE CHEST 1 VIEW COMPARISON:  06/22/2019 FINDINGS: Cardiac shadow is enlarged but stable. Pacing device is again seen. Postsurgical changes are again noted. The lungs are well aerated bilaterally. Small bilateral pleural effusions are noted slightly increased when compared with the prior exam. Mild vascular congestion is again seen with mild edema. No focal confluent infiltrate is noted. IMPRESSION: Mild CHF with small effusions which have increased in the interval from the prior exam. Electronically Signed   By: Alcide Clever M.D.   On: 06/25/2019 16:27   ECHOCARDIOGRAM COMPLETE  Result Date: 06/26/2019    ECHOCARDIOGRAM REPORT   Patient Name:   Stephanie Atkinson Date of Exam: 06/26/2019 Medical Rec #:  [id number]     Height:       63.0 in Accession #:    [id number]    Weight:       173.3 lb Date of Birth:  1925/01/17     BSA:          1.819 m Patient Age:    89 years      BP:           142/65 mmHg Patient Gender: F             HR:           61 bpm. Exam Location:  Inpatient Procedure: 2D Echo Indications:    Dyspnea 786.09 / R06.00  History:        Patient has prior history of Echocardiogram examinations, most                 recent 09/27/2010. Prior  CABG and Pacemaker, Arrythmias:Atrial  Fibrillation, Signs/Symptoms:Syncope; Risk Factors:Hypertension,                 Dyslipidemia and Non-Smoker.  Sonographer:    Leavy Cella Referring Phys: [id number] [place]  1. Septal dyssynergy due to ventricular paciing; biatrial enlargement; mild MR and TR.  2. Left ventricular ejection fraction, by estimation, is 50 to 55%. The left ventricle has low normal function. The left ventricle has no regional wall motion abnormalities. Left ventricular diastolic parameters are indeterminate. Elevated left atrial pressure.  3. Right ventricular systolic function is normal. The right ventricular size is normal. There is mildly elevated pulmonary artery systolic pressure.  4. Left atrial size was severely dilated.  5. Right atrial size was moderately dilated.  6. The mitral valve is normal in structure. Mild mitral valve regurgitation. No evidence of mitral stenosis.  7. The aortic valve is tricuspid. Aortic valve regurgitation is not visualized. Mild aortic valve sclerosis is present, with no evidence of aortic valve stenosis.  8. The inferior vena cava is normal in size with greater than 50% respiratory variability, suggesting right atrial pressure of 3 mmHg. FINDINGS  Left Ventricle: Left ventricular ejection fraction, by estimation, is 50 to 55%. The left ventricle has low normal function. The left ventricle has no regional wall motion abnormalities. The left ventricular internal cavity size was normal in size. There is no left ventricular hypertrophy. Left ventricular diastolic parameters are indeterminate. Elevated left atrial pressure. Right Ventricle: The right ventricular size is normal.Right ventricular systolic function is normal. There is mildly elevated pulmonary artery systolic pressure. The tricuspid regurgitant velocity is 3.09 m/s, and with an assumed right atrial pressure of  3 mmHg, the estimated right ventricular systolic  pressure is 24.5 mmHg. Left Atrium: Left atrial size was severely dilated. Right Atrium: Right atrial size was moderately dilated. Pericardium: There is no evidence of pericardial effusion. Mitral Valve: The mitral valve is normal in structure. Normal mobility of the mitral valve leaflets. Mild mitral annular calcification. Mild mitral valve regurgitation. No evidence of mitral valve stenosis. Tricuspid Valve: The tricuspid valve is normal in structure. Tricuspid valve regurgitation is mild . No evidence of tricuspid stenosis. Aortic Valve: The aortic valve is tricuspid. Aortic valve regurgitation is not visualized. Mild aortic valve sclerosis is present, with no evidence of aortic valve stenosis. Pulmonic Valve: The pulmonic valve was normal in structure. Pulmonic valve regurgitation is mild. No evidence of pulmonic stenosis. Aorta: The aortic root is normal in size and structure. Venous: The inferior vena cava is normal in size with greater than 50% respiratory variability, suggesting right atrial pressure of 3 mmHg.  Additional Comments: Septal dyssynergy due to ventricular paciing; biatrial enlargement; mild MR and TR. A pacer wire is visualized.  LEFT VENTRICLE PLAX 2D LVIDd:         5.20 cm  Diastology LVIDs:         4.10 cm  LV e' lateral:   9.55 cm/s LV PW:         1.30 cm  LV E/e' lateral: 10.6 LV IVS:        1.10 cm  LV e' medial:    4.47 cm/s LVOT diam:     1.90 cm  LV E/e' medial:  22.6 LVOT Area:     2.84 cm  RIGHT VENTRICLE RV S prime:     7.63 cm/s TAPSE (M-mode): 1.3 cm LEFT ATRIUM              Index  RIGHT ATRIUM           Index LA diam:        5.20 cm  2.86 cm/m  RA Area:     23.20 cm LA Vol (A2C):   103.0 ml 56.61 ml/m RA Volume:   75.10 ml  41.28 ml/m LA Vol (A4C):   132.0 ml 72.55 ml/m LA Biplane Vol: 121.0 ml 66.51 ml/m   AORTA Ao Root diam: 2.50 cm MITRAL VALVE                TRICUSPID VALVE MV Area (PHT): 2.91 cm     TR Peak grad:   38.2 mmHg MV Decel Time: 261 msec     TR Vmax:         309.00 cm/s MV E velocity: 101.00 cm/s MV A velocity: 30.00 cm/s   SHUNTS MV E/A ratio:  3.37         Systemic Diam: 1.90 cm Olga Millers MD Electronically signed by Olga Millers MD Signature Date/Time: 06/26/2019/12:47:45 PM    Final     Microbiology: Recent Results (from the past 240 hour(s))  Urine culture     Status: None   Collection Time: 06/22/19  1:06 AM   Specimen: Urine, Random  Result Value Ref Range Status   Specimen Description URINE, RANDOM  Final   Special Requests NONE  Final   Culture   Final    NO GROWTH Performed at [facility], [address] [place], [place] [address]    Report Status 06/23/2019 FINAL  Final  Culture, blood (routine x 2)     Status: None   Collection Time: 06/22/19 10:30 PM   Specimen: BLOOD  Result Value Ref Range Status   Specimen Description BLOOD RIGHT ARM  Final   Special Requests   Final    BOTTLES DRAWN AEROBIC AND ANAEROBIC Blood Culture adequate volume   Culture   Final    NO GROWTH 5 DAYS Performed at [facility], [address], [place], [place] [address]    Report Status 06/27/2019 FINAL  Final  Culture, blood (routine x 2)     Status: None   Collection Time: 06/22/19 10:35 PM   Specimen: BLOOD  Result Value Ref Range Status   Specimen Description BLOOD RIGHT ARM  Final   Special Requests   Final    BOTTLES DRAWN AEROBIC AND ANAEROBIC Blood Culture adequate volume   Culture   Final    NO GROWTH 5 DAYS Performed at [facility], [address], [place], [place] [address]    Report Status 06/27/2019 FINAL  Final  Respiratory Panel by RT PCR (Flu A&B, Covid) - Nasopharyngeal Swab     Status: None   Collection Time: 06/23/19  1:06 AM   Specimen: Nasopharyngeal Swab  Result Value Ref Range Status   SARS Coronavirus 2 by RT PCR NEGATIVE NEGATIVE Final    Comment: (NOTE) SARS-CoV-2 target nucleic acids are NOT DETECTED. The SARS-CoV-2 RNA is generally detectable in upper respiratoy specimens during  the acute phase of infection. The lowest concentration of SARS-CoV-2 viral copies this assay can detect is 131 copies/mL. A negative result does not preclude SARS-Cov-2 infection and should not be used as the sole basis for treatment or other patient management decisions. A negative result may occur with  improper specimen collection/handling, submission of specimen other than nasopharyngeal swab, presence of viral mutation(s) within the areas targeted by this assay, and inadequate number of  viral copies (<131 copies/mL). A negative result must be combined with clinical observations, patient history, and epidemiological information. The expected result is Negative. Fact Sheet for Patients:  https://www.moore.com/ Fact Sheet for Healthcare Providers:  https://www.young.biz/ This test is not yet ap proved or cleared by the [place] FDA and  has been authorized for detection and/or diagnosis of SARS-CoV-2 by FDA under an Emergency Use Authorization (EUA). This EUA will remain  in effect (meaning this test can be used) for the duration of the COVID-19 declaration under Section 564(b)(1) of the Act, 21 U.S.C. section 360bbb-3(b)(1), unless the authorization is terminated or revoked sooner.    Influenza A by PCR NEGATIVE NEGATIVE Final   Influenza B by PCR NEGATIVE NEGATIVE Final    Comment: (NOTE) The Xpert Xpress SARS-CoV-2/FLU/RSV assay is intended as an aid in  the diagnosis of influenza from Nasopharyngeal swab specimens and  should not be used as a sole basis for treatment. Nasal washings and  aspirates are unacceptable for Xpert Xpress SARS-CoV-2/FLU/RSV  testing. Fact Sheet for Patients: https://www.moore.com/ Fact Sheet for Healthcare Providers: https://www.young.biz/ This test is not yet approved or cleared by the [place] FDA and  has been authorized for detection and/or diagnosis of  SARS-CoV-2 by  FDA under an Emergency Use Authorization (EUA). This EUA will remain  in effect (meaning this test can be used) for the duration of the  Covid-19 declaration under Section 564(b)(1) of the Act, 21  U.S.C. section 360bbb-3(b)(1), unless the authorization is  terminated or revoked. Performed at [facility], [address], [place], [place] [address]      Labs: Basic Metabolic Panel: Recent Labs  Lab 06/23/19 0330 06/24/19 0429 06/25/19 0402 06/26/19 0417 06/27/19 0707  NA 139 143 140 142 141  K 4.0 4.3 4.1 3.6 3.8  CL 109 113* 114* 112* 111  CO2 13* 23 20* 22 22  GLUCOSE 117* 105* 95 94 95  BUN CREATININE 1.43* 1.20* 1.08* 1.10* 1.09*  CALCIUM 9.5 9.4 9.4 9.8 9.9   Liver Function Tests: Recent Labs  Lab 06/22/19 2107  AST 30  ALT 19  ALKPHOS 69  BILITOT 1.5*  PROT 6.3*  ALBUMIN 3.7   No results for input(s): LIPASE, AMYLASE in the last 168 hours. No results for input(s): AMMONIA in the last 168 hours. CBC: Recent Labs  Lab 06/22/19 2107 06/22/19 2107 06/23/19 0330 06/24/19 0429 06/25/19 0402 06/26/19 0417 06/27/19 0707  WBC 9.7   < > 13.8* 8.3 7.1 5.5 4.3  NEUTROABS 9.0*  --   --  6.9 5.2 3.9 2.8  HGB 13.1   < > 11.6* 11.1* 11.2* 11.6* 12.8  HCT 40.5   < > 37.4 35.4* 35.8* 36.7 38.9  MCV 90.8   < > 91.0 88.3 89.3 85.5 87.0  PLT 159   < > 161 150 169 191 214   < > = values in this interval not displayed.   Cardiac Enzymes: No results for input(s): CKTOTAL, CKMB, CKMBINDEX, TROPONINI in the last 168 hours. BNP: BNP (last 3 results) Recent Labs    06/22/19 2107  BNP 171.0*    ProBNP (last 3 results) No results for input(s): PROBNP in the last 8760 hours.  CBG: No results for input(s): GLUCAP in the last 168 hours.     Signed:  Briant Cedar, MD [facility] 06/27/2019, 10:17 AM

## 2019-06-27 NOTE — TOC Transition Note (Signed)
Transition of Care [facility]) - CM/SW Discharge Note   Patient Details  Name: Stephanie Atkinson MRN: [id number] Date of Birth: 08-Apr-1924  Transition of Care [facility]) CM/SW Contact:  Lawerance Sabal, RN Phone Number: 06/27/2019, 10:43 AM   Clinical Narrative:    Rondel Jumbo of DC today. No other needs identified.       Barriers to Discharge: Continued Medical Work up   Patient Goals and CMS Choice   CMS Medicare.gov Compare Post Acute Care list provided to:: Patient Choice offered to / list presented to : Patient  Discharge Placement                       Discharge Plan and Services   Discharge Planning Services: CM Consult Post Acute Care Choice: Home Health                    HH Arranged: PT, OT [facility] Agency: [facility] Date [facility] Agency Contacted: 06/25/19   Representative spoke with at [facility] Agency: Kandee Keen  Social Determinants of Health (SDOH) Interventions     Readmission Risk Interventions No flowsheet data found.

## 2019-07-16 ENCOUNTER — Encounter: Payer: Self-pay | Admitting: Cardiovascular Disease

## 2019-07-16 ENCOUNTER — Ambulatory Visit: Payer: Medicare PPO | Admitting: Cardiovascular Disease

## 2019-07-16 ENCOUNTER — Ambulatory Visit (INDEPENDENT_AMBULATORY_CARE_PROVIDER_SITE_OTHER): Payer: Medicare PPO | Admitting: *Deleted

## 2019-07-16 ENCOUNTER — Other Ambulatory Visit: Payer: Self-pay

## 2019-07-16 VITALS — BP 166/78 | HR 79 | Ht 63.0 in | Wt 162.2 lb

## 2019-07-16 DIAGNOSIS — I10 Essential (primary) hypertension: Secondary | ICD-10-CM

## 2019-07-16 DIAGNOSIS — I4821 Permanent atrial fibrillation: Secondary | ICD-10-CM

## 2019-07-16 DIAGNOSIS — E785 Hyperlipidemia, unspecified: Secondary | ICD-10-CM | POA: Diagnosis not present

## 2019-07-16 DIAGNOSIS — Z951 Presence of aortocoronary bypass graft: Secondary | ICD-10-CM | POA: Diagnosis not present

## 2019-07-16 DIAGNOSIS — I5032 Chronic diastolic (congestive) heart failure: Secondary | ICD-10-CM

## 2019-07-16 LAB — CUP PACEART REMOTE DEVICE CHECK
Battery Remaining Longevity: 122 mo
Battery Voltage: 3.01 V
Brady Statistic RV Percent Paced: 98.47 %
Date Time Interrogation Session: 20210521004236
Implantable Lead Implant Date: 20190522
Implantable Lead Location: 753860
Implantable Lead Model: 5076
Implantable Pulse Generator Implant Date: 20190522
Lead Channel Impedance Value: 361 Ohm
Lead Channel Impedance Value: 456 Ohm
Lead Channel Pacing Threshold Amplitude: 0.625 V
Lead Channel Pacing Threshold Pulse Width: 0.4 ms
Lead Channel Sensing Intrinsic Amplitude: 7.75 mV
Lead Channel Sensing Intrinsic Amplitude: 7.75 mV
Lead Channel Setting Pacing Amplitude: 2.5 V
Lead Channel Setting Pacing Pulse Width: 0.4 ms
Lead Channel Setting Sensing Sensitivity: 1.2 mV

## 2019-07-16 NOTE — Assessment & Plan Note (Signed)
History of permanent atrial fibrillation status post permanent transvenous pacemaker implanted by Dr. Royann Shivers 07/16/2017 which he follows.  Patient is on Eliquis oral anticoagulation

## 2019-07-16 NOTE — Patient Instructions (Signed)
Medication Instructions:  The current medical regimen is effective;  continue present plan and medications.  *If you need a refill on your cardiac medications before your next appointment, please call your pharmacy*   Follow-Up: At [facility], you and your health needs are our priority.  As part of our continuing mission to provide you with exceptional heart care, we have created designated Provider Care Teams.  These Care Teams include your primary Cardiologist (physician) and Advanced Practice Providers (APPs -  Physician Assistants and Nurse Practitioners) who all work together to provide you with the care you need, when you need it.  We recommend signing up for the patient portal called "MyChart".  Sign up information is provided on this After Visit Summary.  MyChart is used to connect with patients for Virtual Visits (Telemedicine).  Patients are able to view lab/test results, encounter notes, upcoming appointments, etc.  Non-urgent messages can be sent to your provider as well.   To learn more about what you can do with MyChart, go to [email or web address].    Your next appointment:   Follow up in 6 months with an APP (PA or NP) Follow up in 12 months with Dr.Berry

## 2019-07-16 NOTE — Assessment & Plan Note (Signed)
History of essential hypertension a blood pressure measured today at 166/78.  She is on amlodipine and valsartan as well as Aldactone.

## 2019-07-16 NOTE — Assessment & Plan Note (Signed)
History of CAD status post CABG x4 3/06 she with recent cath 10/12/2012 revealing unchanged anatomy.  She denies chest pain.

## 2019-07-16 NOTE — Progress Notes (Signed)
07/16/2019 Stephanie Atkinson   01-Apr-1924  [id number]  Primary Physician Pearson Grippe, MD Primary Cardiologist: Runell Gess MD Milagros Loll, [place], [place]  HPI:  Stephanie Atkinson is a 86 y.o.   widowed Caucasian female mother of 7 children, grandmother of 14 grandchildren who I last spoke to for a telephone telemedicine phone visit 06/10/2018. She has a history of CAD who I last saw in the office6/30/18.Stephanie Atkinson She had CABG X 4 in 3/06. She had a DES placed to her PDA in July 2012, this was patent on follow up cath in Oct 2012 and recently 10/12/12. She did have distal disease that is to be treated medically. She h in the office 07/10/12 and was noted to have a long 1st degree AVB on her EKG, her PR was > 4. She was asymptomatic. She has a history of PAF since her CABG in 2006 and had been on Toprol, Amiodarone, ASA, and Plavix. At that time we stopped her Toprol and decreased her Amiodarone. When she was seen in follow up 07/29/12 her PR remained prolonged her Amiodarone was discontinued. On 10/06/12 she was seen and noted to be in AF with CVR. She was placed on Eloquis and her Plavix was Stopped. She the was admitted a few days later with chest pain with subtle EKG changes. She was cathed 10/12/12 and her anatomy was unchanged. She was discharge but readmitted later the 19th after a syncopal spell. She had a loop recorder implanted 10/14/12. She was noted to be mildly dehydrated and this, as well as new nitrate therapy, may have contributed to her syncope.  She was complaining of fatigue and had a slow ventricular response to her atrial fibrillation despite being on no negative chronotropic drugs.  Because of increasing dyspnea on exertion and her slow heart rate she underwent a permanent transvenous pacemaker implant by Dr. Royann Shivers O5/22/19 [employer] Azure SR.  She feels clinically improved.  She currently denies chest pain or shortness of breath.  She lives alone but she has 2 of her children in close proximity.   She eats a healthy diet and exercises daily by walking in the neighborhood.  She was admitted with a community-acquired pneumonia 06/22/2019 for 5 days along with diastolic heart failure.  She was treated for this and diuresed.  She feels clinically improved.  She denies chest pain or shortness of breath.   Current Meds  Medication Sig  . acetaminophen (TYLENOL) 500 MG tablet Take 1 tablet (500 mg total) by mouth every 6 (six) hours as needed for moderate pain.  Stephanie Atkinson amLODipine (NORVASC) 10 MG tablet TAKE 1 TABLET BY MOUTH EVERY DAY (Patient taking differently: Take 10 mg by mouth daily. )  . atorvastatin (LIPITOR) 80 MG tablet Take 80 mg by mouth at bedtime.   . Calcium-Vit D-Arg-Inos-Silicon (BONE DENSITY) 300-200 MG-UNIT TABS Take 1 tablet by mouth daily.  . cholecalciferol (VITAMIN D3) 25 MCG (1000 UT) tablet Take 1,000 Units by mouth daily.  . clobetasol cream (TEMOVATE) 0.05 % Apply 1 application topically 3 (three) times daily as needed (to affected areas of legs).   . diphenhydrAMINE (BENADRYL) 25 MG tablet Take 25 mg by mouth every 6 (six) hours as needed for allergies.  Stephanie Atkinson ELIQUIS 5 MG TABS tablet TAKE 1 TABLET BY MOUTH TWICE A DAY (Patient taking differently: Take 5 mg by mouth 2 (two) times daily. )  . furosemide (LASIX) 20 MG tablet Take 1 tablet (20 mg total) by mouth daily.  Stephanie Atkinson  hydroxypropyl methylcellulose / hypromellose (ISOPTO TEARS / GONIOVISC) 2.5 % ophthalmic solution Place 1 drop into both eyes 3 (three) times daily as needed for dry eyes.  Stephanie Atkinson levothyroxine (SYNTHROID, LEVOTHROID) 50 MCG tablet Take 50 mcg by mouth daily before breakfast.   . lidocaine (LIDODERM) 5 % Place 1 patch onto the skin daily. Remove & Discard patch within 12 hours or as directed by MD (Patient taking differently: Place 1 patch onto the skin daily as needed (for pain and Remove & Discard patch within 12 hours or as directed by MD). )  . nitroGLYCERIN (NITROSTAT) 0.4 MG SL tablet Place 0.4 mg under the tongue  every 5 (five) minutes as needed for chest pain.   . traMADol (ULTRAM) 50 MG tablet Take 50 mg by mouth every 6 (six) hours as needed (for pain).   . valsartan (DIOVAN) 160 MG tablet Take 160 mg by mouth daily.     Allergies  Allergen Reactions  . Penicillins Hives    Did it involve swelling of the face/tongue/throat, SOB, or low BP? No Did it involve sudden or severe rash/hives, skin peeling, or any reaction on the inside of your mouth or nose? No Did you need to seek medical attention at a hospital or doctor's office? No When did it last happen? Unk If all above answers are "NO", may proceed with cephalosporin use.   . Sulfa Antibiotics Hives and Swelling    Social History   Socioeconomic History  . Marital status: Single    Spouse name: Not on file  . Number of children: Not on file  . Years of education: Not on file  . Highest education level: Not on file  Occupational History  . Not on file  Tobacco Use  . Smoking status: Never Smoker  . Smokeless tobacco: Current User    Types: Snuff  Substance and Sexual Activity  . Alcohol use: No  . Drug use: No  . Sexual activity: Not on file  Other Topics Concern  . Not on file  Social History Narrative  . Not on file   Social Determinants of Health   Financial Resource Strain:   . Difficulty of Paying Living Expenses:   Food Insecurity:   . Worried About Charity fundraiser in the Last Year:   . Arboriculturist in the Last Year:   Transportation Needs:   . Film/video editor (Medical):   Stephanie Atkinson Lack of Transportation (Non-Medical):   Physical Activity:   . Days of Exercise per Week:   . Minutes of Exercise per Session:   Stress:   . Feeling of Stress :   Social Connections:   . Frequency of Communication with Friends and Family:   . Frequency of Social Gatherings with Friends and Family:   . Attends Religious Services:   . Active Member of Clubs or Organizations:   . Attends Archivist Meetings:   Stephanie Atkinson  Marital Status:   Intimate Partner Violence:   . Fear of Current or Ex-Partner:   . Emotionally Abused:   Stephanie Atkinson Physically Abused:   . Sexually Abused:      Review of Systems: General: negative for chills, fever, night sweats or weight changes.  Cardiovascular: negative for chest pain, dyspnea on exertion, edema, orthopnea, palpitations, paroxysmal nocturnal dyspnea or shortness of breath Dermatological: negative for rash Respiratory: negative for cough or wheezing Urologic: negative for hematuria Abdominal: negative for nausea, vomiting, diarrhea, bright red blood per rectum, melena, or hematemesis Neurologic:  negative for visual changes, syncope, or dizziness All other systems reviewed and are otherwise negative except as noted above.    Blood pressure (!) 166/78, pulse 79, height 5\' 3"  (1.6 m), weight 162 lb 3.2 oz (73.6 kg), SpO2 96 %.  General appearance: alert and no distress Neck: no adenopathy, no carotid bruit, no JVD, supple, symmetrical, trachea midline and thyroid not enlarged, symmetric, no tenderness/mass/nodules Lungs: clear to auscultation bilaterally Heart: regular rate and rhythm, S1, S2 normal, no murmur, click, rub or gallop Extremities: extremities normal, atraumatic, no cyanosis or edema Pulses: 2+ and symmetric Skin: Skin color, texture, turgor normal. No rashes or lesions Neurologic: Alert and oriented X 3, normal strength and tone. Normal symmetric reflexes. Normal coordination and gait  EKG ventricular paced rhythm at 79.  I personally reviewed this EKG.  ASSESSMENT AND PLAN:   Hypertension History of essential hypertension a blood pressure measured today at 166/78.  She is on amlodipine and valsartan as well as Aldactone.  Hx of CABG X 4 3/06-  History of CAD status post CABG x4 3/06 she with recent cath 10/12/2012 revealing unchanged anatomy.  She denies chest pain.  Dyslipidemia History of dyslipidemia on statin therapy with lipid profile performed  06/15/2018 revealing total cholesterol 118, HDL of 39  Permanent atrial fibrillation (HCC) History of permanent atrial fibrillation status post permanent transvenous pacemaker implanted by Dr. 06/17/2018 07/16/2017 which he follows.  Patient is on Eliquis oral anticoagulation      07/18/2017 MD [facility], [facility] 07/16/2019 11:59 AM

## 2019-07-16 NOTE — Assessment & Plan Note (Signed)
History of dyslipidemia on statin therapy with lipid profile performed 06/15/2018 revealing total cholesterol 118, HDL of 39

## 2019-07-19 NOTE — Progress Notes (Signed)
Remote pacemaker transmission.   

## 2019-07-28 ENCOUNTER — Other Ambulatory Visit: Payer: Self-pay

## 2019-07-28 ENCOUNTER — Ambulatory Visit: Payer: Self-pay

## 2019-07-28 ENCOUNTER — Ambulatory Visit: Payer: Medicare PPO | Admitting: Surgery

## 2019-07-28 ENCOUNTER — Encounter: Payer: Self-pay | Admitting: Surgery

## 2019-07-28 VITALS — Ht 63.0 in | Wt 162.2 lb

## 2019-07-28 DIAGNOSIS — M79671 Pain in right foot: Secondary | ICD-10-CM

## 2019-07-28 DIAGNOSIS — M25561 Pain in right knee: Secondary | ICD-10-CM | POA: Diagnosis not present

## 2019-08-04 ENCOUNTER — Ambulatory Visit: Payer: Medicare PPO | Admitting: Orthopaedic Surgery

## 2019-08-04 ENCOUNTER — Ambulatory Visit: Payer: Medicare PPO | Admitting: Family Medicine

## 2019-08-09 ENCOUNTER — Ambulatory Visit: Payer: Medicare PPO | Admitting: Family Medicine

## 2019-09-07 ENCOUNTER — Encounter: Payer: Self-pay | Admitting: Surgery

## 2019-09-07 NOTE — Progress Notes (Signed)
Office Visit Note   Patient: Stephanie Atkinson           Date of Birth: December 07, 1924           MRN: [id number] Visit Date: 07/28/2019              Requested by: Pearson Grippe, MD [address] [place],  [place] [address] PCP: Pearson Grippe, MD   Assessment & Plan: Visit Diagnoses:  1. Right foot pain   2. Acute pain of right knee     Plan: in hopes of giving patient relief of her knee pain I  offered injection.  After patient consent right knee was prepped and intraarticular injection was performed.  Tolerated without complication.  Will refer to dr hilts for possible right midfoot injection with [place].    Follow-Up Instructions: Return in about 1 week (around 08/04/2019) for with dr hilts for possible US guided right midfoot injection for djd.   Orders:  Orders Placed This Encounter  Procedures   XR Knee 1-2 Views Right   XR Foot Complete Right   No orders of the defined types were placed in this encounter.     Procedures: No procedures performed   Clinical Data: No additional findings.   Subjective: Chief Complaint  Patient presents with   Right Knee - Pain   Right Foot - Pain    HPI 86 yo female presenting complaints of right knee pain right foot pain.  She has known history of right knee DJD.  Right knee injected January 2020.  Good temporary relief.  Review of Systems No current cardiac, pulm, gi, gu issues.   Objective: Vital Signs: Ht 5\' 3"  (1.6 m)    Wt 162 lb 3.2 oz (73.6 kg)    BMI 28.73 kg/m   Physical Exam Constitutional:      Comments: Very pleasant elderly female.   HENT:     Head: Normocephalic and atraumatic.  Pulmonary:     Effort: No respiratory distress.  Musculoskeletal:     Comments: Gait somewhat antalgic. Right knee positive crepitus.  Joint line tender.  Some swelling without significant effusion.  Right dorsal midfoot TTP.  No swelling or bruising.  NVI.   Neurological:     Mental Status: She is alert and oriented to person,  place, and time.     Ortho Exam  Specialty Comments:  No specialty comments available.  Imaging: No results found.   PMFS History: Patient Active Problem List   Diagnosis Date Noted   Community acquired bacterial pneumonia 06/23/2019   Acute kidney injury superimposed on chronic kidney disease (HCC) 06/23/2019   Synovitis of right knee 03/02/2019   Chronic diastolic heart failure (HCC) 09/14/2018   Pacemaker 07/16/2017   Atrial fibrillation with slow ventricular response (HCC) 06/27/2017   Hypercholesterolemia 06/27/2017   Symptomatic bradycardia 06/27/2017   Chronic pain of right knee 05/17/2016   De Quervain's tenosynovitis 05/17/2016   CAD (coronary artery disease) of artery bypass graft 02/13/2015   Encounter for loop recorder check 02/01/2014   Accelerated junctional rhythm 05/04/2013   Dehydration 10/14/2012   Syncope and collapse 10/13/2012   History of first degree atrioventricular block 10/11/2012   Long term (current) use of anticoagulants 10/06/2012   AVB - beta blocker and Amiodarone stopped 07/10/2012   Hypertension    Hx of CABG X 4 3/06-     Dyslipidemia    RCA DES placed 7/12- patent 8/14    Permanent atrial fibrillation (HCC)  Chronic kidney disease, stage 3    Past Medical History:  Diagnosis Date   Chronic kidney disease, stage 3    Coronary artery disease    Hx of CABG 2006   LIMA-LAD, free RIMA-PDA, Radial-OM1-OM2   Hyperlipidemia    Hypertension    Hypothyroid    MI, old        PAF (paroxysmal atrial fibrillation) (HCC)    was on amio, d/c'd due to prolonged PR interval, rate control   Presence of permanent cardiac pacemaker 07/16/2017   Presence of stent in right coronary artery 07/12   RIMA-PDA occluded, DES stent placed   Syncope 07/12   bradycardic, beta blocker decreased, also felt to be dehydrated    Family History  Problem Relation Age of Onset   CAD Mother    CAD Maternal Grandmother       Past Surgical History:  Procedure Laterality Date   CARDIAC CATHETERIZATION  [phone]   dr. Onalee Hua harding, revealing a atretic bypass to her right with patent distal RCA stent   CAROTID STENT  2012   CORONARY ARTERY BYPASS GRAFT  2006   LIMA-LAD, free RIMA-PDA, Radial-OM1-OM2   DOPPLER ECHOCARDIOGRAPHY  [id number]   mild asymmetric left ventricular hypertrophy, left ventricular systolic function is low normal, ejection fraction = 50-55%, the LA is moderate dilated, the RA is mildly dilated, no significant valvular disease   INSERT / REPLACE / REMOVE PACEMAKER  07/16/2017   JOINT REPLACEMENT     hip replacement   LEFT HEART CATHETERIZATION WITH CORONARY/GRAFT ANGIOGRAM N/A 10/12/2012   Procedure: LEFT HEART CATHETERIZATION WITH Isabel Caprice;  Surgeon: Marykay Lex, MD;  Location: [facility] CATH LAB;  Service: Cardiovascular;  Laterality: N/A;   LOOP RECORDER IMPLANT N/A 10/15/2012   Procedure: LOOP RECORDER IMPLANT;  Surgeon: Thurmon Fair, MD;  Location: [facility] CATH LAB;  Service: Cardiovascular;  Laterality: N/A;   LOOP RECORDER REMOVAL  07/16/2017   LOOP RECORDER REMOVAL N/A 07/16/2017   Procedure: LOOP RECORDER REMOVAL;  Surgeon: Thurmon Fair, MD;  Location: [facility] INVASIVE CV LAB;  Service: Cardiovascular;  Laterality: N/A;   NM MYOVIEW LTD  [id number]   post stress left ventricle is normal in size, post stress ejection fraction is 67% global left ventricular systolic function is normal, normal myocardial perfusion study, abnormal myocardial perfusion study, low risk scan   PACEMAKER IMPLANT N/A 07/16/2017   Procedure: PACEMAKER IMPLANT;  Surgeon: Thurmon Fair, MD;  Location: [facility] INVASIVE CV LAB;  Service: Cardiovascular;  Laterality: N/A;   THYROIDECTOMY     Social History   Occupational History   Not on file  Tobacco Use   Smoking status: Never Smoker   Smokeless tobacco: Current User    Types: Snuff  Vaping Use   Vaping Use: Never used  Substance and Sexual  Activity   Alcohol use: No   Drug use: No   Sexual activity: Not on file

## 2019-10-15 ENCOUNTER — Ambulatory Visit (INDEPENDENT_AMBULATORY_CARE_PROVIDER_SITE_OTHER): Payer: Medicare PPO | Admitting: *Deleted

## 2019-10-15 DIAGNOSIS — I4891 Unspecified atrial fibrillation: Secondary | ICD-10-CM | POA: Diagnosis not present

## 2019-10-15 LAB — CUP PACEART REMOTE DEVICE CHECK
Battery Remaining Longevity: 117 mo
Battery Voltage: 3.01 V
Brady Statistic RV Percent Paced: 99.19 %
Date Time Interrogation Session: 20210820004145
Implantable Lead Implant Date: 20190522
Implantable Lead Location: 753860
Implantable Lead Model: 5076
Implantable Pulse Generator Implant Date: 20190522
Lead Channel Impedance Value: 361 Ohm
Lead Channel Impedance Value: 437 Ohm
Lead Channel Pacing Threshold Amplitude: 0.5 V
Lead Channel Pacing Threshold Pulse Width: 0.4 ms
Lead Channel Sensing Intrinsic Amplitude: 7.75 mV
Lead Channel Sensing Intrinsic Amplitude: 7.75 mV
Lead Channel Setting Pacing Amplitude: 2.5 V
Lead Channel Setting Pacing Pulse Width: 0.4 ms
Lead Channel Setting Sensing Sensitivity: 1.2 mV

## 2019-10-18 NOTE — Progress Notes (Signed)
Remote pacemaker transmission.   

## 2019-10-22 ENCOUNTER — Ambulatory Visit: Payer: Medicare PPO | Admitting: Cardiovascular Disease

## 2019-11-08 ENCOUNTER — Ambulatory Visit: Payer: Medicare PPO | Admitting: Cardiovascular Disease

## 2019-11-10 ENCOUNTER — Telehealth: Payer: Self-pay

## 2019-11-10 NOTE — Telephone Encounter (Signed)
LM2CB- changed pts appointment to 245pm d/t JC change of schedule

## 2019-11-11 NOTE — Telephone Encounter (Signed)
Left detailed message for [name] Rashid(daughter)  (DPR) Re: Monday appt time change

## 2019-11-15 ENCOUNTER — Telehealth: Payer: Medicare PPO | Admitting: General Practice

## 2019-11-15 NOTE — Telephone Encounter (Signed)
Looks like pt rescheduled to 11-22-2019

## 2019-11-18 NOTE — Progress Notes (Signed)
Virtual Visit via Telephone Note   This visit type was conducted due to national recommendations for restrictions regarding the COVID-19 Pandemic (e.g. social distancing) in an effort to limit this patient's exposure and mitigate transmission in our community.  Due to her co-morbid illnesses, this patient is at least at moderate risk for complications without adequate follow up.  This format is felt to be most appropriate for this patient at this time.  The patient did not have access to video technology/had technical difficulties with video requiring transitioning to audio format only (telephone).  All issues noted in this document were discussed and addressed.  No physical exam could be performed with this format.  Please refer to the patient's chart for her  consent to telehealth for [facility].  Evaluation Performed:  Follow-up visit  This visit type was conducted due to national recommendations for restrictions regarding the COVID-19 Pandemic (e.g. social distancing).  This format is felt to be most appropriate for this patient at this time.  All issues noted in this document were discussed and addressed.  No physical exam was performed (except for noted visual exam findings with Video Visits).  Please refer to the patient's chart (MyChart message for video visits and phone note for telephone visits) for the patient's consent to telehealth for [facility]  Date:  11/22/2019   ID:  Stephanie Atkinson, DOB 01-May-1924, MRN [id number]  Patient Location:  [id number] [place] At[place] [id number]   Provider location:     [facility] [address] Office [phone] Fax [phone]   PCP:  Stephanie Grippe, MD  Cardiologist:  Stephanie Batty, MD  Electrophysiologist:  None   Chief Complaint: Follow-up for hypertension  History of Present Illness:    Stephanie Atkinson is a 86 y.o. female who presents via audio/video conferencing for a  telehealth visit today.  Patient verified DOB and address.  She has a PMH of coronary artery disease status post CABG x4 3/06.  She underwent cardiac catheterization with PCI and DES to her PDA 7/12.  She underwent follow-up cardiac catheterization 10/12 and again 8/14.  She was noted to have distal disease and medical management was recommended.  On follow-up in the office 5/14 she was noted to have a long first-degree AV block.  Her PR interval was greater than 4 on her EKG.  She was asymptomatic with this.  Her PMH also includes PAF since her CABG in 2006.  At that time her metoprolol was stopped and her amiodarone was decreased.  On follow-up she was noted to have continued PR prolongation and her amiodarone was discontinued.  During follow-up 8/14 she was noted to be in AF with controlled ventricular rate.  She was placed on Eliquis at that time her Plavix was stopped.  A few days later she was admitted with chest pain and EKG changes were noted.  She underwent cardiac catheterization 10/12/2012 which showed no changes in her coronary anatomy.  She was readmitted on the 19th with symptoms of syncope.  A loop recorder was implanted 10/14/2012.  She was noted to be mildly dehydrated and had started new nitrate therapy.  It was felt that these were also contributing to her syncope.  She had complaints of fatigue and was noted to have slow ventricular response.  She was not on any chronotropic medications.  Due to her increased dyspnea on exertion and slow heart rate she underwent permanent transvenous pacemaker  implantation by Dr. Royann Atkinson 07/16/2017.  Her symptoms clinically improved.  She was admitted with community-acquired pneumonia 4/21 for 5 days and was also noted to have diastolic heart failure.  She was treated with diuresis and IV antibiotics.  She followed up with Dr. Allyson Atkinson on 07/16/2019 and did not chest pain shortness of breath.  She is seen virtually today in follow-up and states she feels well.   She states that she does have some osteoarthritis in her right foot and knee.  She indicates that her right knee and foot do swell.  She monitors her blood pressure which she states is up and down.  Today it was 113/80.  She continues to live independently, do all of her ADLs, laundry, and housework.  She states that until COVID-19 she was walking several days per week.  She continues to walk but not as religiously due to the virus.  I will have her follow-up with myself or Dr. Allyson Atkinson in 6 months and provide her with the salty 6 diet sheet.  Today she denies chest pain, shortness of breath, lower extremity edema, fatigue, palpitations, melena, hematuria, hemoptysis, diaphoresis, weakness, presyncope, syncope, orthopnea, and PND.   The patient does not symptoms concerning for COVID-19 infection (fever, chills, cough, or new SHORTNESS OF BREATH).    Prior CV studies:   The following studies were reviewed today:  Echocardiogram 06/26/2019 IMPRESSIONS    1. Septal dyssynergy due to ventricular paciing; biatrial enlargement;  mild MR and TR.  2. Left ventricular ejection fraction, by estimation, is 50 to 55%. The  left ventricle has low normal function. The left ventricle has no regional  wall motion abnormalities. Left ventricular diastolic parameters are  indeterminate. Elevated left atrial  pressure.  3. Right ventricular systolic function is normal. The right ventricular  size is normal. There is mildly elevated pulmonary artery systolic  pressure.  4. Left atrial size was severely dilated.  5. Right atrial size was moderately dilated.  6. The mitral valve is normal in structure. Mild mitral valve  regurgitation. No evidence of mitral stenosis.  7. The aortic valve is tricuspid. Aortic valve regurgitation is not  visualized. Mild aortic valve sclerosis is present, with no evidence of  aortic valve stenosis.  8. The inferior vena cava is normal in size with greater than 50%   respiratory variability, suggesting right atrial pressure of 3 mmHg.  Past Medical History:  Diagnosis Date  . Chronic kidney disease, stage 3   . Coronary artery disease   . Hx of CABG 2006   LIMA-LAD, free RIMA-PDA, Radial-OM1-OM2  . Hyperlipidemia   . Hypertension   . Hypothyroid   . MI, old       . PAF (paroxysmal atrial fibrillation) (HCC)    was on amio, d/c'd due to prolonged PR interval, rate control  . Presence of permanent cardiac pacemaker 07/16/2017  . Presence of stent in right coronary artery 07/12   RIMA-PDA occluded, DES stent placed  . Syncope 07/12   bradycardic, beta blocker decreased, also felt to be dehydrated   Past Surgical History:  Procedure Laterality Date  . CARDIAC CATHETERIZATION  [id number]   dr. Onalee Huadavid harding, revealing a atretic bypass to her right with patent distal RCA stent  . CAROTID STENT  2012  . CORONARY ARTERY BYPASS GRAFT  2006   LIMA-LAD, free RIMA-PDA, Radial-OM1-OM2  . DOPPLER ECHOCARDIOGRAPHY  [id number]   mild asymmetric left ventricular hypertrophy, left ventricular systolic function is low normal, ejection fraction = 50-55%,  the LA is moderate dilated, the RA is mildly dilated, no significant valvular disease  . INSERT / REPLACE / REMOVE PACEMAKER  07/16/2017  . JOINT REPLACEMENT     hip replacement  . LEFT HEART CATHETERIZATION WITH CORONARY/GRAFT ANGIOGRAM N/A 10/12/2012   Procedure: LEFT HEART CATHETERIZATION WITH Isabel Caprice;  Surgeon: Marykay Lex, MD;  Location: [facility] CATH LAB;  Service: Cardiovascular;  Laterality: N/A;  . LOOP RECORDER IMPLANT N/A 10/15/2012   Procedure: LOOP RECORDER IMPLANT;  Surgeon: Thurmon Fair, MD;  Location: [facility] CATH LAB;  Service: Cardiovascular;  Laterality: N/A;  . LOOP RECORDER REMOVAL  07/16/2017  . LOOP RECORDER REMOVAL N/A 07/16/2017   Procedure: LOOP RECORDER REMOVAL;  Surgeon: Thurmon Fair, MD;  Location: [facility] INVASIVE CV LAB;  Service: Cardiovascular;  Laterality: N/A;  . NM MYOVIEW  LTD  100510   post stress left ventricle is normal in size, post stress ejection fraction is 67% global left ventricular systolic function is normal, normal myocardial perfusion study, abnormal myocardial perfusion study, low risk scan  . PACEMAKER IMPLANT N/A 07/16/2017   Procedure: PACEMAKER IMPLANT;  Surgeon: Thurmon Fair, MD;  Location: [facility] INVASIVE CV LAB;  Service: Cardiovascular;  Laterality: N/A;  . THYROIDECTOMY       Current Meds  Medication Sig  . acetaminophen (TYLENOL) 500 MG tablet Take 1 tablet (500 mg total) by mouth every 6 (six) hours as needed for moderate pain.  [name] amLODipine (NORVASC) 10 MG tablet TAKE 1 TABLET BY MOUTH EVERY DAY (Patient taking differently: Take 10 mg by mouth daily. )  . atorvastatin (LIPITOR) 80 MG tablet Take 80 mg by mouth at bedtime.   . Calcium-Vit D-Arg-Inos-Silicon (BONE DENSITY) 300-200 MG-UNIT TABS Take 1 tablet by mouth daily.  . cholecalciferol (VITAMIN D3) 25 MCG (1000 UT) tablet Take 1,000 Units by mouth daily.  . clobetasol cream (TEMOVATE) 0.05 % Apply 1 application topically 3 (three) times daily as needed (to affected areas of legs).   . diphenhydrAMINE (BENADRYL) 25 MG tablet Take 25 mg by mouth every 6 (six) hours as needed for allergies.  [name] ELIQUIS 5 MG TABS tablet TAKE 1 TABLET BY MOUTH TWICE A DAY (Patient taking differently: Take 5 mg by mouth 2 (two) times daily. )  . furosemide (LASIX) 20 MG tablet Take 1 tablet (20 mg total) by mouth daily.  . hydroxypropyl methylcellulose / hypromellose (ISOPTO TEARS / GONIOVISC) 2.5 % ophthalmic solution Place 1 drop into both eyes 3 (three) times daily as needed for dry eyes.  [name] levothyroxine (SYNTHROID, LEVOTHROID) 50 MCG tablet Take 50 mcg by mouth daily before breakfast.   . lidocaine (LIDODERM) 5 % Place 1 patch onto the skin daily. Remove & Discard patch within 12 hours or as directed by MD (Patient taking differently: Place 1 patch onto the skin daily as needed (for pain and Remove &  Discard patch within 12 hours or as directed by MD). )  . nitroGLYCERIN (NITROSTAT) 0.4 MG SL tablet Place 0.4 mg under the tongue every 5 (five) minutes as needed for chest pain.   [name] spironolactone (ALDACTONE) 25 MG tablet Take 0.5 tablets (12.5 mg total) by mouth daily. Please note dosage change. Patient will call when she needs a refill (Patient taking differently: Take 12.5 mg by mouth daily. )  . traMADol (ULTRAM) 50 MG tablet Take 50 mg by mouth every 6 (six) hours as needed (for pain).   . valsartan (DIOVAN) 160 MG tablet Take 160 mg by mouth daily.  Allergies:   Penicillins and Sulfa antibiotics   Social History   Tobacco Use  . Smoking status: Never Smoker  . Smokeless tobacco: Current User    Types: Snuff  Vaping Use  . Vaping Use: Never used  Substance Use Topics  . Alcohol use: No  . Drug use: No     Family Hx: The patient's family history includes CAD in her maternal grandmother and mother.  ROS:   Please see the history of present illness.     All other systems reviewed and are negative.   Labs/Other Tests and Data Reviewed:    Recent Labs: 06/22/2019: ALT 19; B Natriuretic Peptide 171.0 06/23/2019: TSH 0.835 06/27/2019: BUN 11; Creatinine, Ser 1.09; Hemoglobin 12.8; Platelets 214; Potassium 3.8; Sodium 141   Recent Lipid Panel Lab Results  Component Value Date/Time   CHOL 127 11/25/2016 10:05 AM   TRIG 117 11/25/2016 10:05 AM   HDL 46 11/25/2016 10:05 AM   CHOLHDL 2.8 11/25/2016 10:05 AM   CHOLHDL 2.6 01/05/2015 11:18 AM   LDLCALC 58 11/25/2016 10:05 AM    Wt Readings from Last 3 Encounters:  11/22/19 160 lb (72.6 kg)  07/28/19 162 lb 3.2 oz (73.6 kg)  07/16/19 162 lb 3.2 oz (73.6 kg)     Exam:    Vital Signs:  BP 113/75   Pulse 91   Wt 160 lb (72.6 kg)   BMI 28.34 kg/m    Well nourished, well developed female in no  acute distress.   ASSESSMENT & PLAN:    1.  Essential hypertension-BP today 113/80.  Well-controlled at home. Continue  amlodipine, valsartan, spironolactone Heart healthy low-sodium diet-salty 6 given Increase physical activity as tolerated  Coronary artery disease-no chest pain today.  Status post CABG x4 3/6.  Underwent repeat cardiac catheterization 8/14 which showed unchanged coronary anatomy. Continue atorvastatin, amlodipine Heart healthy low-sodium diet-salty 6 given Increase physical activity as tolerated  Hyperlipidemia-LDL 58 on 11/25/2016 Continue atorvastatin Heart healthy low-sodium diet-salty 6 given Increase physical activity as tolerated Followed by PCP  Atrial fibrillation-permanent.  Heart rate today 91 underwent pacemaker implantation by Dr. Royann Shivers 5/19. Continue Eliquis Heart healthy low-sodium diet-salty 6 given Increase physical activity as tolerated  Disposition: Follow-up with Dr. Allyson Sabal or me in 6 months.  COVID-19 Education: The signs and symptoms of COVID-19 were discussed with the patient and how to seek care for testing (follow up with PCP or arrange E-visit).  The importance of social distancing was discussed today.  Patient Risk:   After full review of this patients clinical status, I feel that they are at least moderate risk at this time.  Time:   Today, I have spent 15 minutes with the patient with telehealth technology discussing hypertension, COVID-19, medications, diet, and exercise.  I spent greater than 20 minutes reviewing her past medical history, previous notes, and cardiac tests.   Medication Adjustments/Labs and Tests Ordered: Current medicines are reviewed at length with the patient today.  Concerns regarding medicines are outlined above.   Tests Ordered: No orders of the defined types were placed in this encounter.  Medication Changes: No orders of the defined types were placed in this encounter.   Disposition:  in 6 month(s)  Signed, Thomasene Ripple. Shawne Eskelson NP-C    09/29/2018 11:58 AM    [facility] [address] Office [phone] Fax [phone]

## 2019-11-22 ENCOUNTER — Telehealth (INDEPENDENT_AMBULATORY_CARE_PROVIDER_SITE_OTHER): Payer: Medicare PPO | Admitting: General Practice

## 2019-11-22 ENCOUNTER — Encounter: Payer: Self-pay | Admitting: General Practice

## 2019-11-22 VITALS — BP 113/80 | HR 91 | Wt 160.0 lb

## 2019-11-22 DIAGNOSIS — Z951 Presence of aortocoronary bypass graft: Secondary | ICD-10-CM

## 2019-11-22 DIAGNOSIS — I10 Essential (primary) hypertension: Secondary | ICD-10-CM | POA: Diagnosis not present

## 2019-11-22 DIAGNOSIS — E785 Hyperlipidemia, unspecified: Secondary | ICD-10-CM | POA: Diagnosis not present

## 2019-11-22 DIAGNOSIS — I4821 Permanent atrial fibrillation: Secondary | ICD-10-CM

## 2019-11-22 DIAGNOSIS — I25810 Atherosclerosis of coronary artery bypass graft(s) without angina pectoris: Secondary | ICD-10-CM

## 2019-11-22 NOTE — Patient Instructions (Addendum)
Medication Instructions:  The current medical regimen is effective;  continue present plan and medications as directed. Please refer to the Current Medication list given to you today. *If you need a refill on your cardiac medications before your next appointment, please call your pharmacy*  Lab Work:   Testing/Procedures:  NONE    NONE  Special Instructions PLEASE READ AND FOLLOW SALTY 6-ATTACHED  PLEASE CONTINUE PHYSICAL ACTIVITY AS TOLERATED  Follow-Up: Your next appointment:  6 month(s) In Person with Nanetta Batty, MD  04-26-2019 @ 130PM  At [facility], you and your health needs are our priority.  As part of our continuing mission to provide you with exceptional heart care, we have created designated Provider Care Teams.  These Care Teams include your primary Cardiologist (physician) and Advanced Practice Providers (APPs -  Physician Assistants and Nurse Practitioners) who all work together to provide you with the care you need, when you need it.  We recommend signing up for the patient portal called "MyChart".  Sign up information is provided on this After Visit Summary.  MyChart is used to connect with patients for Virtual Visits (Telemedicine).  Patients are able to view lab/test results, encounter notes, upcoming appointments, etc.  Non-urgent messages can be sent to your provider as well.   To learn more about what you can do with MyChart, go to [email or web address].

## 2019-12-15 ENCOUNTER — Other Ambulatory Visit: Payer: Self-pay | Admitting: Cardiovascular Disease

## 2019-12-23 ENCOUNTER — Encounter: Payer: Self-pay | Admitting: Surgery

## 2019-12-23 ENCOUNTER — Ambulatory Visit (INDEPENDENT_AMBULATORY_CARE_PROVIDER_SITE_OTHER): Payer: Medicare PPO | Admitting: Surgery

## 2019-12-23 VITALS — Ht 63.0 in | Wt 160.0 lb

## 2019-12-23 DIAGNOSIS — M1711 Unilateral primary osteoarthritis, right knee: Secondary | ICD-10-CM | POA: Diagnosis not present

## 2019-12-23 DIAGNOSIS — M7121 Synovial cyst of popliteal space [Baker], right knee: Secondary | ICD-10-CM

## 2019-12-23 NOTE — Progress Notes (Signed)
86 year old female comes in today with complaints of right posterior knee pain, swelling and stiffness.  Patient has known history of DJD right knee and was last seen by me September 2021 and I performed intra-articular knee injection.  Pretty well with this.  Painful posterior knee when she is ambulating and bends her knee.  No complaints of calf pain.  Exam Very pleasant elderly female alert and oriented in no acute distress.  Gait is minimally antalgic.  Knee range of motion about 0-100 agrees with some discomfort posteriorly.  She has a large Baker's cyst right knee that is tender to palpation.  Right knee joint line nontender.  Calf nontender.   Plan I would like to see if Dr. Prince Rome can perform an ultrasound-guided right knee Baker's cyst aspiration and injection.  Patient states that her daughter comes up from [place] to take her to her appointments.  Patient will call me tomorrow to let me know if she is able to see Dr. Prince Rome next week and if her daughter can provide transportation.  All questions answered.

## 2019-12-24 ENCOUNTER — Ambulatory Visit: Payer: Medicare PPO | Admitting: Orthopaedic Surgery

## 2019-12-24 ENCOUNTER — Other Ambulatory Visit: Payer: Self-pay | Admitting: Cardiovascular Disease

## 2019-12-28 ENCOUNTER — Ambulatory Visit: Payer: Medicare PPO | Admitting: Orthopaedic Surgery

## 2020-01-04 ENCOUNTER — Ambulatory Visit: Payer: Medicare PPO | Admitting: Orthopaedic Surgery

## 2020-01-04 ENCOUNTER — Ambulatory Visit (INDEPENDENT_AMBULATORY_CARE_PROVIDER_SITE_OTHER): Payer: Medicare PPO | Admitting: Orthopaedic Surgery

## 2020-01-04 ENCOUNTER — Other Ambulatory Visit: Payer: Self-pay

## 2020-01-04 ENCOUNTER — Encounter: Payer: Self-pay | Admitting: Orthopaedic Surgery

## 2020-01-04 DIAGNOSIS — I872 Venous insufficiency (chronic) (peripheral): Secondary | ICD-10-CM

## 2020-01-04 DIAGNOSIS — M1711 Unilateral primary osteoarthritis, right knee: Secondary | ICD-10-CM

## 2020-01-04 NOTE — Progress Notes (Signed)
Office Visit Note   Patient: Stephanie Atkinson           Date of Birth: 06/05/1924           MRN: [id number] Visit Date: 01/04/2020              Requested by: Pearson Grippe, MD [address] [place],  [place] [address] PCP: Pearson Grippe, MD   Assessment & Plan: Visit Diagnoses:  1. Venous stasis dermatitis of both lower extremities   2. Unilateral primary osteoarthritis, right knee     Plan: Knee injection performed.  Bilateral knee-high teds applied for venous stasis problems.  Follow-up as needed.  Follow-Up Instructions: No follow-ups on file.   Orders:  No orders of the defined types were placed in this encounter.  No orders of the defined types were placed in this encounter.     Procedures: Large Joint Inj: R knee on 01/06/2020 9:16 AM Indications: pain and joint swelling Details: 22 G 1.5 in needle, anterolateral approach  Arthrogram: No  Medications: 40 mg methylPREDNISolone acetate 40 MG/ML; 0.5 mL lidocaine 1 %; 4 mL bupivacaine 0.25 % Outcome: tolerated well, no immediate complications Procedure, treatment alternatives, risks and benefits explained, specific risks discussed. Consent was given by the patient. Immediately prior to procedure a time out was called to verify the correct patient, procedure, equipment, support staff and site/side marked as required. Patient was prepped and draped in the usual sterile fashion.       Clinical Data: No additional findings.   Subjective: Chief Complaint  Patient presents with  . Right Knee - Pain    HPI 86 year old female returns with recurrent problems with right knee swelling pain and arthritis.  Left knee is not bothering her much she is using tramadol occasionally also Tylenol.  She has noticed venous stasis discoloration over her ankles and if she bumps it she states it takes weeks and weeks where it the area to heal.  Review of Systems review of systems updated past history of AV block, CABG x4.  Knee  arthritis pacemaker diastolic heart failure not currently symptomatic all other systems are negative.   Objective: Vital Signs: Ht 5\' 3"  (1.6 m)   Wt 153 lb (69.4 kg)   BMI 27.10 kg/m   Physical Exam Constitutional:      Appearance: She is well-developed.  HENT:     Head: Normocephalic.     Right Ear: External ear normal.     Left Ear: External ear normal.  Eyes:     Pupils: Pupils are equal, round, and reactive to light.  Neck:     Thyroid: No thyromegaly.     Trachea: No tracheal deviation.  Cardiovascular:     Rate and Rhythm: Normal rate.     Comments: Median sternotomy Pulmonary:     Effort: Pulmonary effort is normal.  Abdominal:     Palpations: Abdomen is soft.  Skin:    General: Skin is warm and dry.  Neurological:     Mental Status: She is alert and oriented to person, place, and time.  Psychiatric:        Behavior: Behavior normal.     Ortho Exam 2+ knee effusion right knee crepitus.  She is amatory with a right knee limp.  Bilateral venous discoloration without active ulceration. Specialty Comments:  No specialty comments available.  Imaging: No results found.   PMFS History: Patient Active Problem List   Diagnosis Date Noted  . Venous stasis dermatitis  of both lower extremities 01/06/2020  . Unilateral primary osteoarthritis, right knee 01/06/2020  . Community acquired bacterial pneumonia 06/23/2019  . Acute kidney injury superimposed on chronic kidney disease (HCC) 06/23/2019  . Synovitis of right knee 03/02/2019  . Chronic diastolic heart failure (HCC) 09/14/2018  . Pacemaker 07/16/2017  . Atrial fibrillation with slow ventricular response (HCC) 06/27/2017  . Hypercholesterolemia 06/27/2017  . Symptomatic bradycardia 06/27/2017  . Chronic pain of right knee 05/17/2016  . De Quervain's tenosynovitis 05/17/2016  . CAD (coronary artery disease) of artery bypass graft 02/13/2015  . Encounter for loop recorder check 02/01/2014  . Accelerated  junctional rhythm 05/04/2013  . Dehydration 10/14/2012  . Syncope and collapse 10/13/2012  . History of first degree atrioventricular block 10/11/2012  . Long term (current) use of anticoagulants 10/06/2012  . AVB - beta blocker and Amiodarone stopped 07/10/2012  . Hypertension   . Hx of CABG X 4 3/06-    . Dyslipidemia   . RCA DES placed 7/12- patent 8/14   . Permanent atrial fibrillation (HCC)   . Chronic kidney disease, stage 3 (HCC)    Past Medical History:  Diagnosis Date  . Chronic kidney disease, stage 3 (HCC)   . Coronary artery disease   . Hx of CABG 2006   LIMA-LAD, free RIMA-PDA, Radial-OM1-OM2  . Hyperlipidemia   . Hypertension   . Hypothyroid   . MI, old       . PAF (paroxysmal atrial fibrillation) (HCC)    was on amio, d/c'd due to prolonged PR interval, rate control  . Presence of permanent cardiac pacemaker 07/16/2017  . Presence of stent in right coronary artery 07/12   RIMA-PDA occluded, DES stent placed  . Syncope 07/12   bradycardic, beta blocker decreased, also felt to be dehydrated    Family History  Problem Relation Age of Onset  . CAD Mother   . CAD Maternal Grandmother     Past Surgical History:  Procedure Laterality Date  . CARDIAC CATHETERIZATION  694503   dr. Onalee Hua harding, revealing a atretic bypass to her right with patent distal RCA stent  . CAROTID STENT  2012  . CORONARY ARTERY BYPASS GRAFT  2006   LIMA-LAD, free RIMA-PDA, Radial-OM1-OM2  . DOPPLER ECHOCARDIOGRAPHY  [id number]   mild asymmetric left ventricular hypertrophy, left ventricular systolic function is low normal, ejection fraction = 50-55%, the LA is moderate dilated, the RA is mildly dilated, no significant valvular disease  . INSERT / REPLACE / REMOVE PACEMAKER  07/16/2017  . JOINT REPLACEMENT     hip replacement  . LEFT HEART CATHETERIZATION WITH CORONARY/GRAFT ANGIOGRAM N/A 10/12/2012   Procedure: LEFT HEART CATHETERIZATION WITH Isabel Caprice;  Surgeon: Marykay Lex, MD;  [facility] C[facility] LAB;  Service: Cardiovascular;  Laterality: N/A;  . LOOP RECORDER IMPLANT N/A 10/15/2012   Procedure: LOOP RECORDER IMPLANT;  Surgeon: Thurmon Fair, MD;  Location: [facility] CATH LAB;  Service: Cardiovascular;  Laterality: N/A;  . LOOP RECORDER REMOVAL  07/16/2017  . LOOP RECORDER REMOVAL N/A 07/16/2017   Procedure: LOOP RECORDER REMOVAL;  Surgeon: Thurmon Fair, MD;  Location: [facility] INVASIVE CV LAB;  Service: Cardiovascular;  Laterality: N/A;  . NM MYOVIEW LTD  100510   post stress left ventricle is normal in size, post stress ejection fraction is 67% global left ventricular systolic function is normal, normal myocardial perfusion study, abnormal myocardial perfusion study, low risk scan  . PACEMAKER IMPLANT N/A 07/16/2017   Procedure: PACEMAKER IMPLANT;  Surgeon: Thurmon Fair,  MD;  Location: [facility] INVASIVE CV LAB;  Service: Cardiovascular;  Laterality: N/A;  . THYROIDECTOMY     Social History   Occupational History  . Not on file  Tobacco Use  . Smoking status: Never Smoker  . Smokeless tobacco: Current User    Types: Snuff  Vaping Use  . Vaping Use: Never used  Substance and Sexual Activity  . Alcohol use: No  . Drug use: No  . Sexual activity: Not on file

## 2020-01-06 ENCOUNTER — Ambulatory Visit: Payer: Medicare PPO | Admitting: Surgery

## 2020-01-06 DIAGNOSIS — I872 Venous insufficiency (chronic) (peripheral): Secondary | ICD-10-CM | POA: Insufficient documentation

## 2020-01-06 DIAGNOSIS — M1711 Unilateral primary osteoarthritis, right knee: Secondary | ICD-10-CM | POA: Insufficient documentation

## 2020-01-06 MED ORDER — METHYLPREDNISOLONE ACETATE 40 MG/ML IJ SUSP
40.0000 mg | INTRAMUSCULAR | Status: AC | PRN
Start: 2020-01-06 — End: 2020-01-06
  Administered 2020-01-06: 40 mg via INTRA_ARTICULAR

## 2020-01-06 MED ORDER — BUPIVACAINE HCL 0.25 % IJ SOLN
4.0000 mL | INTRAMUSCULAR | Status: AC | PRN
  Administered 2020-01-06: 4 mL via INTRA_ARTICULAR

## 2020-01-06 MED ORDER — LIDOCAINE HCL 1 % IJ SOLN
0.5000 mL | INTRAMUSCULAR | Status: AC | PRN
  Administered 2020-01-06: .5 mL

## 2020-01-14 ENCOUNTER — Ambulatory Visit (INDEPENDENT_AMBULATORY_CARE_PROVIDER_SITE_OTHER): Payer: Medicare PPO

## 2020-01-14 DIAGNOSIS — I4891 Unspecified atrial fibrillation: Secondary | ICD-10-CM

## 2020-01-14 LAB — CUP PACEART REMOTE DEVICE CHECK
Battery Remaining Longevity: 114 mo
Battery Voltage: 3.01 V
Brady Statistic RV Percent Paced: 98.82 %
Date Time Interrogation Session: 20211118234317
Implantable Lead Implant Date: 20190522
Implantable Lead Location: 753860
Implantable Lead Model: 5076
Implantable Pulse Generator Implant Date: 20190522
Lead Channel Impedance Value: 361 Ohm
Lead Channel Impedance Value: 437 Ohm
Lead Channel Pacing Threshold Amplitude: 0.625 V
Lead Channel Pacing Threshold Pulse Width: 0.4 ms
Lead Channel Sensing Intrinsic Amplitude: 8 mV
Lead Channel Sensing Intrinsic Amplitude: 8 mV
Lead Channel Setting Pacing Amplitude: 2.5 V
Lead Channel Setting Pacing Pulse Width: 0.4 ms
Lead Channel Setting Sensing Sensitivity: 1.2 mV

## 2020-01-17 NOTE — Progress Notes (Signed)
Remote pacemaker transmission.   

## 2020-02-11 ENCOUNTER — Other Ambulatory Visit: Payer: Self-pay | Admitting: Cardiovascular Disease

## 2020-02-11 NOTE — Telephone Encounter (Signed)
Prescription refill request for Eliquis received. Indication: atrial fibrillation Last office visit: 10/2019  Stephanie Atkinson Scr: 1.09 06/2019 Age:  86 Weight: 69.4 kg  Prescription refilled

## 2020-04-14 ENCOUNTER — Ambulatory Visit (INDEPENDENT_AMBULATORY_CARE_PROVIDER_SITE_OTHER): Payer: Medicare PPO

## 2020-04-14 DIAGNOSIS — I4821 Permanent atrial fibrillation: Secondary | ICD-10-CM | POA: Diagnosis not present

## 2020-04-14 LAB — CUP PACEART REMOTE DEVICE CHECK
Battery Remaining Longevity: 111 mo
Battery Voltage: 3 V
Brady Statistic RV Percent Paced: 98.47 %
Date Time Interrogation Session: 20220217234202
Implantable Lead Implant Date: 20190522
Implantable Lead Location: 753860
Implantable Lead Model: 5076
Implantable Pulse Generator Implant Date: 20190522
Lead Channel Impedance Value: 342 Ohm
Lead Channel Impedance Value: 437 Ohm
Lead Channel Pacing Threshold Amplitude: 0.625 V
Lead Channel Pacing Threshold Pulse Width: 0.4 ms
Lead Channel Sensing Intrinsic Amplitude: 5.875 mV
Lead Channel Sensing Intrinsic Amplitude: 5.875 mV
Lead Channel Setting Pacing Amplitude: 2.5 V
Lead Channel Setting Pacing Pulse Width: 0.4 ms
Lead Channel Setting Sensing Sensitivity: 1.2 mV

## 2020-04-17 ENCOUNTER — Telehealth: Payer: Self-pay | Admitting: Specialist

## 2020-04-18 NOTE — Progress Notes (Signed)
Remote pacemaker transmission.   

## 2020-04-20 ENCOUNTER — Ambulatory Visit: Payer: Medicare PPO | Admitting: Surgery

## 2020-04-25 ENCOUNTER — Ambulatory Visit: Payer: Medicare PPO | Admitting: Cardiovascular Disease

## 2020-04-26 ENCOUNTER — Other Ambulatory Visit: Payer: Self-pay | Admitting: *Deleted

## 2020-04-26 ENCOUNTER — Encounter: Payer: Self-pay | Admitting: Cardiology

## 2020-04-26 ENCOUNTER — Telehealth (INDEPENDENT_AMBULATORY_CARE_PROVIDER_SITE_OTHER): Payer: Medicare PPO | Admitting: Cardiology

## 2020-04-26 VITALS — BP 150/70 | HR 76 | Ht 63.0 in | Wt 159.0 lb

## 2020-04-26 DIAGNOSIS — I10 Essential (primary) hypertension: Secondary | ICD-10-CM

## 2020-04-26 DIAGNOSIS — M25561 Pain in right knee: Secondary | ICD-10-CM

## 2020-04-26 DIAGNOSIS — Z955 Presence of coronary angioplasty implant and graft: Secondary | ICD-10-CM

## 2020-04-26 DIAGNOSIS — I4821 Permanent atrial fibrillation: Secondary | ICD-10-CM

## 2020-04-26 DIAGNOSIS — Z951 Presence of aortocoronary bypass graft: Secondary | ICD-10-CM | POA: Diagnosis not present

## 2020-04-26 DIAGNOSIS — G8929 Other chronic pain: Secondary | ICD-10-CM

## 2020-04-26 DIAGNOSIS — Z7901 Long term (current) use of anticoagulants: Secondary | ICD-10-CM | POA: Diagnosis not present

## 2020-04-26 DIAGNOSIS — Z950 Presence of cardiac pacemaker: Secondary | ICD-10-CM

## 2020-04-26 MED ORDER — NITROGLYCERIN 0.4 MG SL SUBL: 0.4000 mg | SUBLINGUAL_TABLET | SUBLINGUAL | 6 refills | Status: DC | PRN

## 2020-04-26 NOTE — Progress Notes (Signed)
Virtual Visit via Telephone Note   This visit type was conducted due to national recommendations for restrictions regarding the COVID-19 Pandemic (e.g. social distancing) in an effort to limit this patient's exposure and mitigate transmission in our community.  Due to her co-morbid illnesses, this patient is at least at moderate risk for complications without adequate follow up.  This format is felt to be most appropriate for this patient at this time.  The patient did not have access to video technology/had technical difficulties with video requiring transitioning to audio format only (telephone).  All issues noted in this document were discussed and addressed.  No physical exam could be performed with this format.  Please refer to the patient's chart for her  consent to telehealth for [facility].    Date:  04/26/2020   ID:  Stephanie Atkinson, DOB 1924/11/15, MRN [id number] The patient was identified using 2 identifiers.  Patient Location: Home Provider Location: Office/Clinic   PCP:  Pearson Grippe, MD   [facility]  Cardiologist:  Nanetta Batty, MD  Advanced Practice Provider:  No care team member to display Electrophysiologist:  None  :[id number]}   Evaluation Performed:  Follow-Up Visit  Chief Complaint:  none  History of Present Illness:    Stephanie Atkinson is a delightful 87 y.o. female with a history of CAD. She had CABG X 4 in 3/06. She had a DES placed to her PDA in July 2012, this was patent on follow up cath in Oct 2012 and 10/12/12. She did have distal disease that is to be treated medically. She has a history of PAF since her CABG in 2006 and had been on Toprol, Amiodarone, ASA, and Plavix. Eventually her Amiodarone was discontinued secondary to junctional rhythm and bradycardia. She received a pacemaker in May 2019. She is CHADs VASc=5 and on Eliquis 5 mg BID.   Patient was contacted today for routine follow-up.  The patient has been very active.  She is  mentally sharp.  Before CO VID she was out frequently during the day and would go to the mall and walk.  Recently she has had problems with her knee.  She is followed by Dr. Ophelia Charter.  She has had injections in the past but thinks they do not help.  She wants to talk to Dr. Kevan Ny about her knee replacement.  The patient has not had any chest pain or use nitroglycerin.  She does have some dyspnea on exertion at times but no rest symptoms.  An echocardiogram was done 06/26/2019 and showed normal LV function.  The patient does not have symptoms concerning for COVID-19 infection (fever, chills, cough, or new shortness of breath).    Past Medical History:  Diagnosis Date  . Chronic kidney disease, stage 3 (HCC)   . Coronary artery disease   . Hx of CABG 2006   LIMA-LAD, free RIMA-PDA, Radial-OM1-OM2  . Hyperlipidemia   . Hypertension   . Hypothyroid   . MI, old       . PAF (paroxysmal atrial fibrillation) (HCC)    was on amio, d/c'd due to prolonged PR interval, rate control  . Presence of permanent cardiac pacemaker 07/16/2017  . Presence of stent in right coronary artery 07/12   RIMA-PDA occluded, DES stent placed  . Syncope 07/12   bradycardic, beta blocker decreased, also felt to be dehydrated   Past Surgical History:  Procedure Laterality Date  . CARDIAC CATHETERIZATION  364680   dr. Onalee Hua harding,  revealing a atretic bypass to her right with patent distal RCA stent  . CAROTID STENT  2012  . CORONARY ARTERY BYPASS GRAFT  2006   LIMA-LAD, free RIMA-PDA, Radial-OM1-OM2  . DOPPLER ECHOCARDIOGRAPHY  [id number]   mild asymmetric left ventricular hypertrophy, left ventricular systolic function is low normal, ejection fraction = 50-55%, the LA is moderate dilated, the RA is mildly dilated, no significant valvular disease  . INSERT / REPLACE / REMOVE PACEMAKER  07/16/2017  . JOINT REPLACEMENT     hip replacement  . LEFT HEART CATHETERIZATION WITH CORONARY/GRAFT ANGIOGRAM N/A 10/12/2012    Procedure: LEFT HEART CATHETERIZATION WITH Isabel CapriceORONARY/GRAFT ANGIOGRAM;  Surgeon: Marykay Lexavid W Harding, MD;  [facility];  Service: Cardiovascular;  Laterality: N/A;  . LOOP RECORDER IMPLANT N/A 10/15/2012   Procedure: LOOP RECORDER IMPLANT;  Surgeon: Thurmon FairMihai Croitoru, MD;  Location: [facility] CATH LAB;  Service: Cardiovascular;  Laterality: N/A;  . LOOP RECORDER REMOVAL  07/16/2017  . LOOP RECORDER REMOVAL N/A 07/16/2017   Procedure: LOOP RECORDER REMOVAL;  Surgeon: Thurmon Fairroitoru, Mihai, MD;  Location: [facility] INVASIVE CV LAB;  Service: Cardiovascular;  Laterality: N/A;  . NM MYOVIEW LTD  100510   post stress left ventricle is normal in size, post stress ejection fraction is 67% global left ventricular systolic function is normal, normal myocardial perfusion study, abnormal myocardial perfusion study, low risk scan  . PACEMAKER IMPLANT N/A 07/16/2017   Procedure: PACEMAKER IMPLANT;  Surgeon: Thurmon Fairroitoru, Mihai, MD;  Location: [facility] INVASIVE CV LAB;  Service: Cardiovascular;  Laterality: N/A;  . THYROIDECTOMY       Current Meds  Medication Sig  . acetaminophen (TYLENOL) 500 MG tablet Take 1 tablet (500 mg total) by mouth every 6 (six) hours as needed for moderate pain.  [name]. amLODipine (NORVASC) 10 MG tablet TAKE 1 TABLET BY MOUTH EVERY DAY  . atorvastatin (LIPITOR) 80 MG tablet Take 80 mg by mouth at bedtime.   . Calcium-Vit D-Arg-Inos-Silicon (BONE DENSITY) 300-200 MG-UNIT TABS Take 1 tablet by mouth daily.  . cholecalciferol (VITAMIN D3) 25 MCG (1000 UT) tablet Take 1,000 Units by mouth daily.  . clobetasol cream (TEMOVATE) 0.05 % Apply 1 application topically 3 (three) times daily as needed (to affected areas of legs).   . diphenhydrAMINE (BENADRYL) 25 MG tablet Take 25 mg by mouth every 6 (six) hours as needed for allergies.  [name]. ELIQUIS 5 MG TABS tablet TAKE 1 TABLET BY MOUTH TWICE A DAY  . furosemide (LASIX) 20 MG tablet TAKE 1 TABLET BY MOUTH EVERY DAY  . hydroxypropyl methylcellulose / hypromellose (ISOPTO TEARS /  GONIOVISC) 2.5 % ophthalmic solution Place 1 drop into both eyes 3 (three) times daily as needed for dry eyes.  [name]. levothyroxine (SYNTHROID, LEVOTHROID) 50 MCG tablet Take 50 mcg by mouth daily before breakfast.   . lidocaine (LIDODERM) 5 % Place 1 patch onto the skin daily. Remove & Discard patch within 12 hours or as directed by MD (Patient taking differently: Place 1 patch onto the skin daily as needed (for pain and Remove & Discard patch within 12 hours or as directed by MD).)  . nitroGLYCERIN (NITROSTAT) 0.4 MG SL tablet Place 0.4 mg under the tongue every 5 (five) minutes as needed for chest pain.   . traMADol (ULTRAM) 50 MG tablet Take 50 mg by mouth every 6 (six) hours as needed (for pain).   . traZODone (DESYREL) 100 MG tablet Take 100 mg by mouth at bedtime.  . valsartan (DIOVAN) 160 MG tablet Take 160 mg  by mouth daily.     Allergies:   Penicillins and Sulfa antibiotics   Social History   Tobacco Use  . Smoking status: Never Smoker  . Smokeless tobacco: Current User    Types: Snuff  Vaping Use  . Vaping Use: Never used  Substance Use Topics  . Alcohol use: No  . Drug use: No     Family Hx: The patient's family history includes CAD in her maternal grandmother and mother.  ROS:   Please see the history of present illness.    Fully vaccinated All other systems reviewed and are negative.   Prior CV studies:   The following studies were reviewed today:  Echo 06/26/2019- IMPRESSIONS    1. Septal dyssynergy due to ventricular paciing; biatrial enlargement;  mild MR and TR.  2. Left ventricular ejection fraction, by estimation, is 50 to 55%. The  left ventricle has low normal function. The left ventricle has no regional  wall motion abnormalities. Left ventricular diastolic parameters are  indeterminate. Elevated left atrial  pressure.  3. Right ventricular systolic function is normal. The right ventricular  size is normal. There is mildly elevated pulmonary artery  systolic  pressure.  4. Left atrial size was severely dilated.  5. Right atrial size was moderately dilated.  6. The mitral valve is normal in structure. Mild mitral valve  regurgitation. No evidence of mitral stenosis.  7. The aortic valve is tricuspid. Aortic valve regurgitation is not  visualized. Mild aortic valve sclerosis is present, with no evidence of  aortic valve stenosis.  8. The inferior vena cava is normal in size with greater than 50%  respiratory variability, suggesting right atrial pressure of 3 mmHg.   Labs/Other Tests and Data Reviewed:    EKG:  An ECG dated 07/16/2019 was personally reviewed today and demonstrated:  V-paced  Recent Labs: 06/22/2019: ALT 19; B Natriuretic Peptide 171.0 06/23/2019: TSH 0.835 06/27/2019: BUN 11; Creatinine, Ser 1.09; Hemoglobin 12.8; Platelets 214; Potassium 3.8; Sodium 141   Recent Lipid Panel Lab Results  Component Value Date/Time   CHOL 127 11/25/2016 10:05 AM   TRIG 117 11/25/2016 10:05 AM   HDL 46 11/25/2016 10:05 AM   CHOLHDL 2.8 11/25/2016 10:05 AM   CHOLHDL 2.6 01/05/2015 11:18 AM   LDLCALC 58 11/25/2016 10:05 AM    Wt Readings from Last 3 Encounters:  04/26/20 159 lb (72.1 kg)  01/04/20 153 lb (69.4 kg)  12/23/19 160 lb (72.6 kg)     Risk Assessment/Calculations:    CHA2DS2-VASc Score =   5       Objective:    Vital Signs:  BP (!) 150/70   Pulse 76   Ht 5\' 3"  (1.6 m)   Wt 159 lb (72.1 kg)   BMI 28.17 kg/m    VITAL SIGNS:  reviewed  ASSESSMENT & PLAN:    Permanent atrial fibrillation (HCC) Slow VR, s/p PTVDP May 2021 with improvement in her symptoms  Hypertension Borderline high  Hx of CABG X 4 3/06-  CABG x 4 2006- no angina  RCA DES placed 7/12- patent 8/14 RIMA-PDA occluded, DES stent placed  Chronic anticoagulation- Eloquis started and Plavix stopped 10/07/12 CHADs VASc=5  Plan: She will talk with Dr 10/09/12 about the possibility of knee replacement.  I discussed this with Dr Ophelia Charter  and we feel she would be an acceptable candidate from a cardiac standpoint.        COVID-19 Education: The signs and symptoms of COVID-19 were discussed with the  patient and how to seek care for testing (follow up with PCP or arrange E-visit). The importance of social distancing was discussed today.  Time:   Today, I have spent 20 minutes with the patient with telehealth technology discussing the above problems.     Medication Adjustments/Labs and Tests Ordered: Current medicines are reviewed at length with the patient today.  Concerns regarding medicines are outlined above.   Tests Ordered: No orders of the defined types were placed in this encounter.   Medication Changes: No orders of the defined types were placed in this encounter.   Follow Up:  In Person in 6 months with dr Allyson Sabal  Signed, Corine Shelter, PA-C  04/26/2020 10:56 AM    [facility]

## 2020-04-26 NOTE — Patient Instructions (Signed)
Medication Instructions:  No changes to be made If you need a refill on your cardiac medications before your next appointment, please call your pharmacy   Lab Work: No lab work ordered today If you have labs (blood work) drawn today and your tests are completely normal, you will receive your results only by: [name] MyChart Message (if you have MyChart) OR . A paper copy in the mail If you have any lab test that is abnormal or we need to change your treatment, we will call you to review the results.   Testing/Procedures: No testing ordered today   Follow-Up: At [facility] you and your health needs are our priority.  As part of our continuing mission to provide you with exceptional heart care, we have created designated Provider Care Teams.  These Care Teams include your primary Cardiologist (physician) and Advanced Practice Providers (APPs -  Physician Assistants and Nurse Practitioners) who all work together to provide you with the care you need, when you need it.  We recommend signing up for the patient portal called "MyChart".  Sign up information is provided on this After Visit Summary.  MyChart is used to connect with patients for Virtual Visits (Telemedicine).  Patients are able to view lab/test results, encounter notes, upcoming appointments, etc.  Non-urgent messages can be sent to your provider as well.   To learn more about what you can do with MyChart, go to [email or web address].    Your next appointment:   6 month(s)  The format for your next appointment:   In Person  Provider:   You may see Nanetta Batty, MD or one of the following Advanced Practice Providers on your designated Care Team:    [place], PA-C  Edd Fabian, FNP    Other Instructions

## 2020-05-11 ENCOUNTER — Encounter: Payer: Self-pay | Admitting: Surgery

## 2020-05-11 ENCOUNTER — Ambulatory Visit: Payer: Medicare PPO | Admitting: Surgery

## 2020-05-11 ENCOUNTER — Other Ambulatory Visit: Payer: Self-pay

## 2020-05-11 ENCOUNTER — Telehealth: Payer: Self-pay

## 2020-05-11 VITALS — Ht 63.0 in | Wt 159.0 lb

## 2020-05-11 DIAGNOSIS — G8929 Other chronic pain: Secondary | ICD-10-CM | POA: Diagnosis not present

## 2020-05-11 DIAGNOSIS — M545 Low back pain, unspecified: Secondary | ICD-10-CM

## 2020-05-11 DIAGNOSIS — M1711 Unilateral primary osteoarthritis, right knee: Secondary | ICD-10-CM

## 2020-05-11 NOTE — Telephone Encounter (Signed)
Noted  

## 2020-05-11 NOTE — Telephone Encounter (Signed)
Please precert for visco in right knee, per Fayrene Fearing. Thanks!

## 2020-05-11 NOTE — Progress Notes (Addendum)
Office Visit Note   Patient: Stephanie Atkinson           Date of Birth: April 19, 1924           MRN: [id number] Visit Date: 05/11/2020              Requested by: Pearson Grippe, MD [address] [place],  [place] [address] PCP: Pearson Grippe, MD   Assessment & Plan: Visit Diagnoses:  1. Primary osteoarthritis of right knee   2. Chronic low back pain without sciatica, unspecified back pain laterality     Plan: Today we discussed ongoing conservative management of patient's right knee pain secondary to osteoarthritis.  Patient states that she does not want to consider definitive treatment with total knee replacement.  We will get preauthorization for Orthovisc series.  We will have April  contact patient and schedule appointment with me once this is approved.  Previous knee x-rays showed tricompartmental degenerative changes with near bone-on-bone medial compartment and periarticular spurring.  Follow-Up Instructions: Return if symptoms worsen or fail to improve.   Orders:  No orders of the defined types were placed in this encounter.  No orders of the defined types were placed in this encounter.     Procedures: No procedures performed   Clinical Data: No additional findings.   Subjective: Chief Complaint  Patient presents with  . Right Knee - Follow-up, Pain  . Lower Back - Pain    HPI Patient with known history of osteoarthritis right knee and pain returns.  States that her knee continues to be bothersome.  Worse when she is ambulating.  Dr. Ophelia Charter performed right intra-articular Marcaine/steroid injection November 2021.  She did not have long relief with this.  States that she is not interested in definitive treatment of her osteoarthritis with total knee replacement.  Patient is also having some intermittent low back pain.  Patient has known history of lumbar spondylosis.  No radicular component.   Objective: Vital Signs: Ht 5\' 3"  (1.6 m)   Wt 159 lb (72.1 kg)    BMI 28.17 kg/m   Physical Exam Patient ambulates well.  Minimal lumbar paraspinal tenderness.  Right knee positive crepitus.  Good range of motion.  Some swelling without large effusion.  Nontender. Ortho Exam  Specialty Comments:  No specialty comments available.  Imaging: No results found.   PMFS History: Patient Active Problem List   Diagnosis Date Noted  . Venous stasis dermatitis of both lower extremities 01/06/2020  . Unilateral primary osteoarthritis, right knee 01/06/2020  . Community acquired bacterial pneumonia 06/23/2019  . Acute kidney injury superimposed on chronic kidney disease (HCC) 06/23/2019  . Synovitis of right knee 03/02/2019  . Chronic diastolic heart failure (HCC) 09/14/2018  . Pacemaker 07/16/2017  . Atrial fibrillation with slow ventricular response (HCC) 06/27/2017  . Hypercholesterolemia 06/27/2017  . Symptomatic bradycardia 06/27/2017  . Chronic pain of right knee 05/17/2016  . De Quervain's tenosynovitis 05/17/2016  . CAD (coronary artery disease) of artery bypass graft 02/13/2015  . Encounter for loop recorder check 02/01/2014  . Accelerated junctional rhythm 05/04/2013  . Dehydration 10/14/2012  . Syncope and collapse 10/13/2012  . History of first degree atrioventricular block 10/11/2012  . Long term (current) use of anticoagulants 10/06/2012  . AVB - beta blocker and Amiodarone stopped 07/10/2012  . Hypertension   . Hx of CABG X 4 3/06-    . Dyslipidemia   . RCA DES placed 7/12- patent 8/14   . Permanent atrial  fibrillation (HCC)   . Chronic kidney disease, stage 3 (HCC)    Past Medical History:  Diagnosis Date  . Chronic kidney disease, stage 3 (HCC)   . Coronary artery disease   . Hx of CABG 2006   LIMA-LAD, free RIMA-PDA, Radial-OM1-OM2  . Hyperlipidemia   . Hypertension   . Hypothyroid   . MI, old       . PAF (paroxysmal atrial fibrillation) (HCC)    was on amio, d/c'd due to prolonged PR interval, rate control  . Presence  of permanent cardiac pacemaker 07/16/2017  . Presence of stent in right coronary artery 07/12   RIMA-PDA occluded, DES stent placed  . Syncope 07/12   bradycardic, beta blocker decreased, also felt to be dehydrated    Family History  Problem Relation Age of Onset  . CAD Mother   . CAD Maternal Grandmother     Past Surgical History:  Procedure Laterality Date  . CARDIAC CATHETERIZATION  993570   dr. Onalee Hua harding, revealing a atretic bypass to her right with patent distal RCA stent  . CAROTID STENT  2012  . CORONARY ARTERY BYPASS GRAFT  2006   LIMA-LAD, free RIMA-PDA, Radial-OM1-OM2  . DOPPLER ECHOCARDIOGRAPHY  [id number]   mild asymmetric left ventricular hypertrophy, left ventricular systolic function is low normal, ejection fraction = 50-55%, the LA is moderate dilated, the RA is mildly dilated, no significant valvular disease  . INSERT / REPLACE / REMOVE PACEMAKER  07/16/2017  . JOINT REPLACEMENT     hip replacement  . LEFT HEART CATHETERIZATION WITH CORONARY/GRAFT ANGIOGRAM N/A 10/12/2012   Procedure: LEFT HEART CATHETERIZATION WITH Isabel Caprice;  Surgeon: Marykay Lex, MD;  Location: [facility] CATH LAB;  Service: Cardiovascular;  Laterality: N/A;  . LOOP RECORDER IMPLANT N/A 10/15/2012   Procedure: LOOP RECORDER IMPLANT;  Surgeon: Thurmon Fair, MD;  Location: [facility] CATH LAB;  Service: Cardiovascular;  Laterality: N/A;  . LOOP RECORDER REMOVAL  07/16/2017  . LOOP RECORDER REMOVAL N/A 07/16/2017   Procedure: LOOP RECORDER REMOVAL;  Surgeon: Thurmon Fair, MD;  Location: [facility] INVASIVE CV LAB;  Service: Cardiovascular;  Laterality: N/A;  . NM MYOVIEW LTD  100510   post stress left ventricle is normal in size, post stress ejection fraction is 67% global left ventricular systolic function is normal, normal myocardial perfusion study, abnormal myocardial perfusion study, low risk scan  . PACEMAKER IMPLANT N/A 07/16/2017   Procedure: PACEMAKER IMPLANT;  Surgeon: Thurmon Fair, MD;   Location: [facility] INVASIVE CV LAB;  Service: Cardiovascular;  Laterality: N/A;  . THYROIDECTOMY     Social History   Occupational History  . Not on file  Tobacco Use  . Smoking status: Never Smoker  . Smokeless tobacco: Current User    Types: Snuff  Vaping Use  . Vaping Use: Never used  Substance and Sexual Activity  . Alcohol use: No  . Drug use: No  . Sexual activity: Not on file

## 2020-05-15 ENCOUNTER — Telehealth: Payer: Self-pay | Admitting: Surgery

## 2020-05-15 NOTE — Telephone Encounter (Signed)
Talked with patient's daughter [name] concerning approval for gel injection.  [employer] that she will receive a call once approved by insurance. [name] voiced that she understands.

## 2020-05-15 NOTE — Telephone Encounter (Signed)
Patient daughter [name] called asked if the gel injection has been approved by the insurance? The number to contact [name] is [phone]

## 2020-05-16 ENCOUNTER — Telehealth: Payer: Self-pay

## 2020-05-16 NOTE — Telephone Encounter (Signed)
Submitted VOB for Monovisc, right knee. Pending BV 

## 2020-05-22 ENCOUNTER — Encounter: Payer: Self-pay | Admitting: Cardiovascular Disease

## 2020-05-22 ENCOUNTER — Ambulatory Visit (INDEPENDENT_AMBULATORY_CARE_PROVIDER_SITE_OTHER): Payer: Medicare PPO | Admitting: Cardiovascular Disease

## 2020-05-22 ENCOUNTER — Other Ambulatory Visit: Payer: Self-pay

## 2020-05-22 VITALS — BP 144/62 | HR 75 | Ht 63.0 in | Wt 160.0 lb

## 2020-05-22 DIAGNOSIS — I10 Essential (primary) hypertension: Secondary | ICD-10-CM

## 2020-05-22 DIAGNOSIS — E7800 Pure hypercholesterolemia, unspecified: Secondary | ICD-10-CM

## 2020-05-22 DIAGNOSIS — I4821 Permanent atrial fibrillation: Secondary | ICD-10-CM | POA: Diagnosis not present

## 2020-05-22 DIAGNOSIS — I5032 Chronic diastolic (congestive) heart failure: Secondary | ICD-10-CM

## 2020-05-22 DIAGNOSIS — I25810 Atherosclerosis of coronary artery bypass graft(s) without angina pectoris: Secondary | ICD-10-CM

## 2020-05-22 DIAGNOSIS — Z950 Presence of cardiac pacemaker: Secondary | ICD-10-CM

## 2020-05-22 DIAGNOSIS — Z79899 Other long term (current) drug therapy: Secondary | ICD-10-CM

## 2020-05-22 LAB — BASIC METABOLIC PANEL
BUN/Creatinine Ratio: 21 (ref 12–28)
BUN: 30 mg/dL (ref 10–36)
CO2: 18 mmol/L — ABNORMAL LOW (ref 20–29)
Calcium: 11 mg/dL — ABNORMAL HIGH (ref 8.7–10.3)
Chloride: 103 mmol/L (ref 96–106)
Creatinine, Ser: 1.41 mg/dL — ABNORMAL HIGH (ref 0.57–1.00)
Glucose: 78 mg/dL (ref 65–99)
Potassium: 5.1 mmol/L (ref 3.5–5.2)
Sodium: 136 mmol/L (ref 134–144)
eGFR: 35 mL/min/{1.73_m2} — ABNORMAL LOW (ref >59)

## 2020-05-22 NOTE — Progress Notes (Signed)
Cardiology Office Note:    Date:  05/22/2020   ID:  Stephanie Atkinson, DOB 02-28-1924, MRN [id number]  PCP:  Pearson Grippe, MD   [facility]  Cardiologist:  Nanetta Batty, MD Croitoru(pacemaker) Advanced Practice Provider:  No care team member to display Electrophysiologist:  None       Referring MD: Pearson Grippe, MD   No chief complaint on file. Pacemaker check  History of Present Illness:    Stephanie Atkinson is a 87 y.o. female with a hx of CAD s/p CABG, permanent atrial fibrillation with slow ventricular response and a history of syncope followed by single-chamber permanent pacemaker (Medtronic Azure XT SR implanted May 2019).  She is doing quite well.  She has just celebrated her 89th birthday.  She almost fell by tripping over a curb a few days ago but managed to catch herself.  She has not had any true injuries or bleeding problems.  She remains pretty active, pacemaker documents 1-1/2 hours a day of movement every day.  She denies problems with edema, orthopnea, PND or exertional dyspnea and has not had chest discomfort.    She has not required recent adjustments in her loop diuretic, but because of hypertension, her primary care provider recently increased her spironolactone to a full 25 daily.  This happened about 3 to 4 weeks ago.  She has not had labs since.  Back in 2020, she had problems with worsening renal function when we had to cut back on her doses of furosemide and spironolactone  Pacemaker interrogation shows normal device function.  She is not pacemaker dependent.  The underlying rhythm is atrial fibrillation with a ventricular rate around 45-50 bpm.  She has 99% ventricular pacing.  The heart rate histogram distribution appears appropriate.  There is a single 6 beat run of nonsustained VT recorded since her last pacemaker download.  Past Medical History:  Diagnosis Date  . Chronic kidney disease, stage 3 (HCC)   . Coronary artery disease   . Hx of  CABG 2006   LIMA-LAD, free RIMA-PDA, Radial-OM1-OM2  . Hyperlipidemia   . Hypertension   . Hypothyroid   . MI, old       . PAF (paroxysmal atrial fibrillation) (HCC)    was on amio, d/c'd due to prolonged PR interval, rate control  . Presence of permanent cardiac pacemaker 07/16/2017  . Presence of stent in right coronary artery 07/12   RIMA-PDA occluded, DES stent placed  . Syncope 07/12   bradycardic, beta blocker decreased, also felt to be dehydrated    Past Surgical History:  Procedure Laterality Date  . CARDIAC CATHETERIZATION  867619   dr. Onalee Hua harding, revealing a atretic bypass to her right with patent distal RCA stent  . CAROTID STENT  2012  . CORONARY ARTERY BYPASS GRAFT  2006   LIMA-LAD, free RIMA-PDA, Radial-OM1-OM2  . DOPPLER ECHOCARDIOGRAPHY  [id number]   mild asymmetric left ventricular hypertrophy, left ventricular systolic function is low normal, ejection fraction = 50-55%, the LA is moderate dilated, the RA is mildly dilated, no significant valvular disease  . INSERT / REPLACE / REMOVE PACEMAKER  07/16/2017  . JOINT REPLACEMENT     hip replacement  . LEFT HEART CATHETERIZATION WITH CORONARY/GRAFT ANGIOGRAM N/A 10/12/2012   Procedure: LEFT HEART CATHETERIZATION WITH Isabel Caprice;  Surgeon: Marykay Lex, MD;  Location: [facility] C[facility] LAB;  Service: Cardiovascular;  Laterality: N/A;  . LOOP RECORDER IMPLANT N/A 10/15/2012   Procedure: LOOP RECORDER IMPLANT;  Surgeon: Thurmon Fair, MD;  Location: [facility] CATH LAB;  Service: Cardiovascular;  Laterality: N/A;  . LOOP RECORDER REMOVAL  07/16/2017  . LOOP RECORDER REMOVAL N/A 07/16/2017   Procedure: LOOP RECORDER REMOVAL;  Surgeon: Thurmon Fair, MD;  Location: [facility] INVASIVE CV LAB;  Service: Cardiovascular;  Laterality: N/A;  . NM MYOVIEW LTD  [id number]   post stress left ventricle is normal in size, post stress ejection fraction is 67% global left ventricular systolic function is normal, normal myocardial perfusion study,  abnormal myocardial perfusion study, low risk scan  . PACEMAKER IMPLANT N/A 07/16/2017   Procedure: PACEMAKER IMPLANT;  Surgeon: Thurmon Fair, MD;  Location: [facility] INVASIVE CV LAB;  Service: Cardiovascular;  Laterality: N/A;  . THYROIDECTOMY      Current Medications: Current Meds  Medication Sig  . acetaminophen (TYLENOL) 500 MG tablet Take 1 tablet (500 mg total) by mouth every 6 (six) hours as needed for moderate pain.  [name] amLODipine (NORVASC) 10 MG tablet TAKE 1 TABLET BY MOUTH EVERY DAY  . atorvastatin (LIPITOR) 80 MG tablet Take 80 mg by mouth at bedtime.   . cholecalciferol (VITAMIN D3) 25 MCG (1000 UT) tablet Take 1,000 Units by mouth daily.  . cyclobenzaprine (FLEXERIL) 10 MG tablet 1/2 - 1 tablet  . diphenhydrAMINE (BENADRYL) 25 MG tablet Take 25 mg by mouth every 6 (six) hours as needed for allergies.  [name] ELIQUIS 5 MG TABS tablet TAKE 1 TABLET BY MOUTH TWICE A DAY  . furosemide (LASIX) 20 MG tablet TAKE 1 TABLET BY MOUTH EVERY DAY  . hydroxypropyl methylcellulose / hypromellose (ISOPTO TEARS / GONIOVISC) 2.5 % ophthalmic solution Place 1 drop into both eyes 3 (three) times daily as needed for dry eyes.  [name] levothyroxine (SYNTHROID, LEVOTHROID) 50 MCG tablet Take 50 mcg by mouth daily before breakfast.   . nitroGLYCERIN (NITROSTAT) 0.4 MG SL tablet Place 1 tablet (0.4 mg total) under the tongue every 5 (five) minutes as needed for chest pain.  [name] spironolactone (ALDACTONE) 25 MG tablet TAKE 1/2 TABLET DAILY. PLEASE NOTE DOSAGE CHANGE. (Patient taking differently: Take 25 mg by mouth daily.)  . traMADol (ULTRAM) 50 MG tablet Take 50 mg by mouth every 6 (six) hours as needed (for pain).   . traZODone (DESYREL) 100 MG tablet Take 100 mg by mouth at bedtime. Take 1/2 tablet by mouth at bedtime  . valsartan (DIOVAN) 160 MG tablet Take 160 mg by mouth daily.  . [DISCONTINUED] Calcium-Vit D-Arg-Inos-Silicon (BONE DENSITY) 300-200 MG-UNIT TABS Take 1 tablet by mouth daily.  . [DISCONTINUED]  clobetasol cream (TEMOVATE) 0.05 % Apply 1 application topically 3 (three) times daily as needed (to affected areas of legs).   . [DISCONTINUED] lidocaine (LIDODERM) 5 % Place 1 patch onto the skin daily. Remove & Discard patch within 12 hours or as directed by MD (Patient taking differently: Place 1 patch onto the skin daily as needed (for pain and Remove & Discard patch within 12 hours or as directed by MD).)     Allergies:   Penicillins and Sulfa antibiotics   Social History   Socioeconomic History  . Marital status: Single    Spouse name: Not on file  . Number of children: Not on file  . Years of education: Not on file  . Highest education level: Not on file  Occupational History  . Not on file  Tobacco Use  . Smoking status: Never Smoker  . Smokeless tobacco: Current User    Types: Snuff  Vaping Use  .  Vaping Use: Never used  Substance and Sexual Activity  . Alcohol use: No  . Drug use: No  . Sexual activity: Not on file  Other Topics Concern  . Not on file  Social History Narrative  . Not on file   Social Determinants of Health   Financial Resource Strain: Not on file  Food Insecurity: Not on file  Transportation Needs: Not on file  Physical Activity: Not on file  Stress: Not on file  Social Connections: Not on file     Family History: The patient's family history includes CAD in her maternal grandmother and mother.  ROS:   Please see the history of present illness.     All other systems reviewed and are negative.  EKGs/Labs/Other Studies Reviewed:    The following studies were reviewed today: Comprehensive pacemaker check in the office today.  EKG:  EKG is  ordered today.  The ekg ordered today demonstrates atrial fibrillation with 100% ventricular pacing.  Broad paced QRS 164 ms, QTC 440 ms Recent Labs: 06/22/2019: ALT 19; B Natriuretic Peptide 171.0 06/23/2019: TSH 0.835 06/27/2019: BUN 11; Creatinine, Ser 1.09; Hemoglobin 12.8; Platelets 214; Potassium  3.8; Sodium 141  Recent Lipid Panel    Component Value Date/Time   CHOL 127 11/25/2016 1005   TRIG 117 11/25/2016 1005   HDL 46 11/25/2016 1005   CHOLHDL 2.8 11/25/2016 1005   CHOLHDL 2.6 01/05/2015 1118   VLDL 18 01/05/2015 1118   LDLCALC 58 11/25/2016 1005   01/12/2020  HDL 45, LDL 51, triglycerides 77 Hemoglobin 12.8, TSH 1.59  Risk Assessment/Calculations:    CHA2DS2-VASc Score = 6 This indicates a 7.2% annual risk of stroke. The patient's score is based upon: CHF History: Yes HTN History: Yes Diabetes History: No Stroke History: No Vascular Disease History: Yes Age Score: 2 Gender Score: 1      Physical Exam:    VS:  BP (!) 144/62   Pulse 75   Ht 5\' 3"  (1.6 m)   Wt 160 lb (72.6 kg)   SpO2 93%   BMI 28.34 kg/m     Wt Readings from Last 3 Encounters:  05/22/20 160 lb (72.6 kg)  05/11/20 159 lb (72.1 kg)  04/26/20 159 lb (72.1 kg)     GEN: Appears younger than stated age, well nourished, well developed in no acute distress.  Healthy left subclavian pacemaker site HEENT: Normal NECK: No JVD; No carotid bruits LYMPHATICS: No lymphadenopathy CARDIAC: RRR, paradoxically split second heart sound, no murmurs, rubs, gallops RESPIRATORY:  Clear to auscultation without rales, wheezing or rhonchi  ABDOMEN: Soft, non-tender, non-distended MUSCULOSKELETAL:  No edema; No deformity  SKIN: Warm and dry NEUROLOGIC:  Alert and oriented x 3 PSYCHIATRIC:  Normal affect   ASSESSMENT:    1. Medication management   2. Chronic diastolic heart failure (HCC)   3. Coronary artery disease involving coronary bypass graft of native heart without angina pectoris   4. Permanent atrial fibrillation (HCC)   5. Pacemaker   6. Essential hypertension   7. Hypercholesterolemia    PLAN:    In order of problems listed above:  1. CHF: Clinically euvolemic.  NYHA functional class I-2.  Weight has been stable.  Recheck labs after increased dose of spironolactone, since this was  associated with worsening renal function in the past. 2. CAD: Asymptomatic.  The only antianginal medication she takes is a full dose of amlodipine.  Had bypass surgery in 2006 and last cardiac catheterization in 2014, all conduits open.  On high-dose statin.  Not on aspirin due to full anticoagulation. 3. AFib: Permanent arrhythmia.  Slow ventricular response.  Fully anticoagulated. 4. Pacemaker: Normal device function.  Continue remote downloads every 3 months. 5. HTN: Systolic blood pressure remains mildly elevated, but her diastolic blood pressure is borderline low.  Would not increase medications further.  Check potassium level and renal function today. 6. HLP: On maximum dose atorvastatin.  Most recent LDL cholesterol 51 in November 2021.        Medication Adjustments/Labs and Tests Ordered: Current medicines are reviewed at length with the patient today.  Concerns regarding medicines are outlined above.  Orders Placed This Encounter  Procedures  . Basic metabolic panel  . EKG 12-Lead   No orders of the defined types were placed in this encounter.   Patient Instructions  Medication Instructions:  No changes *If you need a refill on your cardiac medications before your next appointment, please call your pharmacy*   Lab Work: Your provider would like for you to have the following labs today: BMET  If you have labs (blood work) drawn today and your tests are completely normal, you will receive your results only by: [name]. MyChart Message (if you have MyChart) OR . A paper copy in the mail If you have any lab test that is abnormal or we need to change your treatment, we will call you to review the results.   Testing/Procedures: None ordered   Follow-Up: At [facility], you and your health needs are our priority.  As part of our continuing mission to provide you with exceptional heart care, we have created designated Provider Care Teams.  These Care Teams include your primary  Cardiologist (physician) and Advanced Practice Providers (APPs -  Physician Assistants and Nurse Practitioners) who all work together to provide you with the care you need, when you need it.  We recommend signing up for the patient portal called "MyChart".  Sign up information is provided on this After Visit Summary.  MyChart is used to connect with patients for Virtual Visits (Telemedicine).  Patients are able to view lab/test results, encounter notes, upcoming appointments, etc.  Non-urgent messages can be sent to your provider as well.   To learn more about what you can do with MyChart, go to [email or web address].    Your next appointment:   12 month(s)  The format for your next appointment:   In Person  Provider:   Thurmon FairMihai Croitoru, MD       Signed, Thurmon FairMihai Croitoru, MD  05/22/2020 2:29 PM    [facility]

## 2020-05-22 NOTE — Patient Instructions (Signed)
Medication Instructions:  °No changes °*If you need a refill on your cardiac medications before your next appointment, please call your pharmacy* ° ° °Lab Work: °Your provider would like for you to have the following labs today: BMET ° °If you have labs (blood work) drawn today and your tests are completely normal, you will receive your results only by: °MyChart Message (if you have MyChart) OR °A paper copy in the mail °If you have any lab test that is abnormal or we need to change your treatment, we will call you to review the results. ° ° °Testing/Procedures: °None ordered ° ° °Follow-Up: °At [facility], you and your health needs are our priority.  As part of our continuing mission to provide you with exceptional heart care, we have created designated Provider Care Teams.  These Care Teams include your primary Cardiologist (physician) and Advanced Practice Providers (APPs -  Physician Assistants and Nurse Practitioners) who all work together to provide you with the care you need, when you need it. ° °We recommend signing up for the patient portal called "MyChart".  Sign up information is provided on this After Visit Summary.  MyChart is used to connect with patients for Virtual Visits (Telemedicine).  Patients are able to view lab/test results, encounter notes, upcoming appointments, etc.  Non-urgent messages can be sent to your provider as well.   °To learn more about what you can do with MyChart, go to [email or web address].   ° °Your next appointment:   °12 month(s) ° °The format for your next appointment:   °In Person ° °Provider:   °Mihai Croitoru, MD   ° ° ° °

## 2020-05-30 ENCOUNTER — Telehealth: Payer: Self-pay

## 2020-05-30 NOTE — Telephone Encounter (Signed)
PA submitted for Monovisc, right knee through [employer] online. Pending PA# [id number]

## 2020-06-02 ENCOUNTER — Telehealth: Payer: Self-pay

## 2020-06-02 NOTE — Telephone Encounter (Signed)
Needing an updated note stating that patient has Osteoarthritis of right knee and if she has had an inadequate response to conservative non-drug treatments such as eduction, strengthening and ROM exercises, assisted devices and weight loss. Needing a radiology report to support Dx for PA to be approved for gel injection.  Please advise.  Thank You.

## 2020-06-02 NOTE — Telephone Encounter (Signed)
Noted  

## 2020-06-02 NOTE — Telephone Encounter (Signed)
See note in chart from March 17

## 2020-06-02 NOTE — Telephone Encounter (Signed)
Please review

## 2020-06-03 NOTE — Telephone Encounter (Signed)
Done. Thanks.

## 2020-06-05 NOTE — Telephone Encounter (Signed)
Updated note has been submitted online through [employer].

## 2020-06-14 DIAGNOSIS — R300 Dysuria: Secondary | ICD-10-CM | POA: Diagnosis not present

## 2020-06-19 ENCOUNTER — Emergency Department (HOSPITAL_COMMUNITY): Payer: Medicare PPO

## 2020-06-19 ENCOUNTER — Emergency Department (HOSPITAL_COMMUNITY)
Admission: EM | Admit: 2020-06-19 | Discharge: 2020-06-19 | Disposition: A | Discharge status: Home / Self Care | Payer: Medicare PPO | Attending: Emergency Medicine | Admitting: Emergency Medicine

## 2020-06-19 ENCOUNTER — Other Ambulatory Visit: Payer: Self-pay

## 2020-06-19 DIAGNOSIS — Z79899 Other long term (current) drug therapy: Secondary | ICD-10-CM | POA: Diagnosis not present

## 2020-06-19 DIAGNOSIS — I447 Left bundle-branch block, unspecified: Secondary | ICD-10-CM | POA: Diagnosis not present

## 2020-06-19 DIAGNOSIS — I2510 Atherosclerotic heart disease of native coronary artery without angina pectoris: Secondary | ICD-10-CM | POA: Diagnosis not present

## 2020-06-19 DIAGNOSIS — Z951 Presence of aortocoronary bypass graft: Secondary | ICD-10-CM | POA: Insufficient documentation

## 2020-06-19 DIAGNOSIS — I5032 Chronic diastolic (congestive) heart failure: Secondary | ICD-10-CM | POA: Diagnosis not present

## 2020-06-19 DIAGNOSIS — R42 Dizziness and giddiness: Secondary | ICD-10-CM | POA: Diagnosis not present

## 2020-06-19 DIAGNOSIS — N183 Chronic kidney disease, stage 3 unspecified: Secondary | ICD-10-CM | POA: Insufficient documentation

## 2020-06-19 DIAGNOSIS — Z950 Presence of cardiac pacemaker: Secondary | ICD-10-CM | POA: Diagnosis not present

## 2020-06-19 DIAGNOSIS — Z96649 Presence of unspecified artificial hip joint: Secondary | ICD-10-CM | POA: Diagnosis not present

## 2020-06-19 DIAGNOSIS — I130 Hypertensive heart and chronic kidney disease with heart failure and stage 1 through stage 4 chronic kidney disease, or unspecified chronic kidney disease: Secondary | ICD-10-CM | POA: Diagnosis not present

## 2020-06-19 DIAGNOSIS — Z7901 Long term (current) use of anticoagulants: Secondary | ICD-10-CM | POA: Diagnosis not present

## 2020-06-19 DIAGNOSIS — E039 Hypothyroidism, unspecified: Secondary | ICD-10-CM | POA: Diagnosis not present

## 2020-06-19 DIAGNOSIS — G8929 Other chronic pain: Secondary | ICD-10-CM | POA: Diagnosis not present

## 2020-06-19 DIAGNOSIS — I10 Essential (primary) hypertension: Secondary | ICD-10-CM | POA: Diagnosis not present

## 2020-06-19 DIAGNOSIS — R109 Unspecified abdominal pain: Secondary | ICD-10-CM | POA: Diagnosis not present

## 2020-06-19 LAB — COMPREHENSIVE METABOLIC PANEL
ALT: 30 U/L (ref 0–44)
AST: 25 U/L (ref 15–41)
Albumin: 3.9 g/dL (ref 3.5–5.0)
Alkaline Phosphatase: 66 U/L (ref 38–126)
Anion gap: 5 (ref 5–15)
BUN: 22 mg/dL (ref 8–23)
CO2: 23 mmol/L (ref 22–32)
Calcium: 10.8 mg/dL — ABNORMAL HIGH (ref 8.9–10.3)
Chloride: 106 mmol/L (ref 98–111)
Creatinine, Ser: 1.34 mg/dL — ABNORMAL HIGH (ref 0.44–1.00)
GFR, Estimated: 37 mL/min — ABNORMAL LOW (ref >60)
Glucose, Bld: 85 mg/dL (ref 70–99)
Potassium: 4.8 mmol/L (ref 3.5–5.1)
Sodium: 134 mmol/L — ABNORMAL LOW (ref 135–145)
Total Bilirubin: 1.2 mg/dL (ref 0.3–1.2)
Total Protein: 6.9 g/dL (ref 6.5–8.1)

## 2020-06-19 LAB — CBC WITH DIFFERENTIAL/PLATELET
Abs Immature Granulocytes: 0.01 10*3/uL (ref 0.00–0.07)
Basophils Absolute: 0 10*3/uL (ref 0.0–0.1)
Basophils Relative: 0 %
Eosinophils Absolute: 0 10*3/uL (ref 0.0–0.5)
Eosinophils Relative: 0 %
HCT: 39.5 % (ref 36.0–46.0)
Hemoglobin: 12.6 g/dL (ref 12.0–15.0)
Immature Granulocytes: 0 %
Lymphocytes Relative: 20 %
Lymphs Abs: 0.8 10*3/uL (ref 0.7–4.0)
MCH: 28.2 pg (ref 26.0–34.0)
MCHC: 31.9 g/dL (ref 30.0–36.0)
MCV: 88.4 fL (ref 80.0–100.0)
Monocytes Absolute: 0.4 10*3/uL (ref 0.1–1.0)
Monocytes Relative: 11 %
Neutro Abs: 2.9 10*3/uL (ref 1.7–7.7)
Neutrophils Relative %: 69 %
Platelets: 204 10*3/uL (ref 150–400)
RBC: 4.47 MIL/uL (ref 3.87–5.11)
RDW: 15.2 % (ref 11.5–15.5)
WBC: 4.2 10*3/uL (ref 4.0–10.5)
nRBC: 0 % (ref 0.0–0.2)

## 2020-06-19 LAB — URINALYSIS, ROUTINE W REFLEX MICROSCOPIC
Bilirubin Urine: NEGATIVE
Glucose, UA: NEGATIVE mg/dL
Hgb urine dipstick: NEGATIVE
Ketones, ur: NEGATIVE mg/dL
Leukocytes,Ua: NEGATIVE
Nitrite: NEGATIVE
Protein, ur: NEGATIVE mg/dL
Specific Gravity, Urine: 1.005 (ref 1.005–1.030)
pH: 6 (ref 5.0–8.0)

## 2020-06-19 LAB — LIPASE, BLOOD: Lipase: 64 U/L — ABNORMAL HIGH (ref 11–51)

## 2020-06-19 MED ORDER — IOHEXOL 300 MG/ML  SOLN
80.0000 mL | Freq: Once | INTRAMUSCULAR | Status: AC | PRN
  Administered 2020-06-19: 80 mL via INTRAVENOUS

## 2020-06-19 NOTE — Discharge Instructions (Addendum)
Please return for any problem.   Continue to use tylenol and tramadol as previously suggested.

## 2020-06-19 NOTE — ED Notes (Signed)
Got patient undressed on the monitor did ekg shown to er provider got patient a warm blanket patient is resting with call bell in reach

## 2020-06-19 NOTE — ED Notes (Signed)
Attempted to interrogate pacemaker but unable to due to malfunction with medtronic equipment.

## 2020-06-19 NOTE — ED Notes (Signed)
Pillow is unavailable.  Provided patient with rolled blanket as pillow.  Family member is not happy about patient not having the comfort of a pillow.

## 2020-06-19 NOTE — ED Notes (Signed)
Patient transported to CT 

## 2020-06-19 NOTE — ED Provider Notes (Signed)
[facility] EMERGENCY DEPARTMENT Provider Note   CSN: [id number] Arrival date & time: 06/19/20  0932     History No chief complaint on file.   Stephanie Atkinson is a 87 y.o. female.  87 year old female with prior medical history as detailed below presents for evaluation.  Patient complains of right-sided flank discomfort.  This is an ongoing chronic issue.  Patient reports pain is worse with movement or with lifting or twisting.  Patient denies associated nausea or vomiting.  She denies bowel movement change.  She denies urinary symptoms.  She denies fever.   Patient with prior presentations with similar complaint.  Work-up previously without significant acute abnormality found.  The history is provided by the patient and medical records.  Illness Location:  Right flank pain, chronic Severity:  Mild Onset quality:  Unable to specify Timing:  Unable to specify Progression:  Unable to specify Chronicity:  Chronic      Past Medical History:  Diagnosis Date  . Chronic kidney disease, stage 3 (HCC)   . Coronary artery disease   . Hx of CABG 2006   LIMA-LAD, free RIMA-PDA, Radial-OM1-OM2  . Hyperlipidemia   . Hypertension   . Hypothyroid   . MI, old       . PAF (paroxysmal atrial fibrillation) (HCC)    was on amio, d/c'd due to prolonged PR interval, rate control  . Presence of permanent cardiac pacemaker 07/16/2017  . Presence of stent in right coronary artery 07/12   RIMA-PDA occluded, DES stent placed  . Syncope 07/12   bradycardic, beta blocker decreased, also felt to be dehydrated    Patient Active Problem List   Diagnosis Date Noted  . Venous stasis dermatitis of both lower extremities 01/06/2020  . Unilateral primary osteoarthritis, right knee 01/06/2020  . Community acquired bacterial pneumonia 06/23/2019  . Acute kidney injury superimposed on chronic kidney disease (HCC) 06/23/2019  . Synovitis of right knee 03/02/2019  . Chronic diastolic  heart failure (HCC) 40/34/7425  . Pacemaker 07/16/2017  . Atrial fibrillation with slow ventricular response (HCC) 06/27/2017  . Hypercholesterolemia 06/27/2017  . Symptomatic bradycardia 06/27/2017  . Chronic pain of right knee 05/17/2016  . De Quervain's tenosynovitis 05/17/2016  . CAD (coronary artery disease) of artery bypass graft 02/13/2015  . Encounter for loop recorder check 02/01/2014  . Accelerated junctional rhythm 05/04/2013  . Dehydration 10/14/2012  . Syncope and collapse 10/13/2012  . History of first degree atrioventricular block 10/11/2012  . Long term (current) use of anticoagulants 10/06/2012  . AVB - beta blocker and Amiodarone stopped 07/10/2012  . Hypertension   . Hx of CABG X 4 3/06-    . Dyslipidemia   . RCA DES placed 7/12- patent 8/14   . Permanent atrial fibrillation (HCC)   . Chronic kidney disease, stage 3 (HCC)     Past Surgical History:  Procedure Laterality Date  . CARDIAC CATHETERIZATION  956387   dr. Onalee Hua harding, revealing a atretic bypass to her right with patent distal RCA stent  . CAROTID STENT  2012  . CORONARY ARTERY BYPASS GRAFT  2006   LIMA-LAD, free RIMA-PDA, Radial-OM1-OM2  . DOPPLER ECHOCARDIOGRAPHY  [id number]   mild asymmetric left ventricular hypertrophy, left ventricular systolic function is low normal, ejection fraction = 50-55%, the LA is moderate dilated, the RA is mildly dilated, no significant valvular disease  . INSERT / REPLACE / REMOVE PACEMAKER  07/16/2017  . JOINT REPLACEMENT     hip replacement  .  LEFT HEART CATHETERIZATION WITH CORONARY/GRAFT ANGIOGRAM N/A 10/12/2012   Procedure: LEFT HEART CATHETERIZATION WITH Isabel Caprice;  Surgeon: Marykay Lex, MD;  Location: [facility] CATH LAB;  Service: Cardiovascular;  Laterality: N/A;  . LOOP RECORDER IMPLANT N/A 10/15/2012   Procedure: LOOP RECORDER IMPLANT;  Surgeon: Thurmon Fair, MD;  Location: [facility] CATH LAB;  Service: Cardiovascular;  Laterality: N/A;  . LOOP RECORDER  REMOVAL  07/16/2017  . LOOP RECORDER REMOVAL N/A 07/16/2017   Procedure: LOOP RECORDER REMOVAL;  Surgeon: Thurmon Fair, MD;  Location: [facility] INVASIVE CV LAB;  Service: Cardiovascular;  Laterality: N/A;  . NM MYOVIEW LTD  [id number]   post stress left ventricle is normal in size, post stress ejection fraction is 67% global left ventricular systolic function is normal, normal myocardial perfusion study, abnormal myocardial perfusion study, low risk scan  . PACEMAKER IMPLANT N/A 07/16/2017   Procedure: PACEMAKER IMPLANT;  Surgeon: Thurmon Fair, MD;  Location: [facility] INVASIVE CV LAB;  Service: Cardiovascular;  Laterality: N/A;  . THYROIDECTOMY       OB History   No obstetric history on file.     Family History  Problem Relation Age of Onset  . CAD Mother   . CAD Maternal Grandmother     Social History   Tobacco Use  . Smoking status: Never Smoker  . Smokeless tobacco: Current User    Types: Snuff  Vaping Use  . Vaping Use: Never used  Substance Use Topics  . Alcohol use: No  . Drug use: No    Home Medications Prior to Admission medications   Medication Sig Start Date End Date Taking? Authorizing Provider  acetaminophen (TYLENOL) 500 MG tablet Take 1 tablet (500 mg total) by mouth every 6 (six) hours as needed for moderate pain. 06/27/19   Briant Cedar, MD  amLODipine (NORVASC) 10 MG tablet TAKE 1 TABLET BY MOUTH EVERY DAY 12/16/19   Runell Gess, MD  atorvastatin (LIPITOR) 80 MG tablet Take 80 mg by mouth at bedtime.     [provider]  cholecalciferol (VITAMIN D3) 25 MCG (1000 UT) tablet Take 1,000 Units by mouth daily.    [provider]  cyclobenzaprine (FLEXERIL) 10 MG tablet 1/2 - 1 tablet 05/30/16   [provider]  diphenhydrAMINE (BENADRYL) 25 MG tablet Take 25 mg by mouth every 6 (six) hours as needed for allergies.    [provider]  ELIQUIS 5 MG TABS tablet TAKE 1 TABLET BY MOUTH TWICE A DAY 02/11/20   Runell Gess, MD   furosemide (LASIX) 20 MG tablet TAKE 1 TABLET BY MOUTH EVERY DAY 12/24/19   Runell Gess, MD  hydroxypropyl methylcellulose / hypromellose (ISOPTO TEARS / GONIOVISC) 2.5 % ophthalmic solution Place 1 drop into both eyes 3 (three) times daily as needed for dry eyes.    [provider]  levothyroxine (SYNTHROID, LEVOTHROID) 50 MCG tablet Take 50 mcg by mouth daily before breakfast.     [provider]  nitroGLYCERIN (NITROSTAT) 0.4 MG SL tablet Place 1 tablet (0.4 mg total) under the tongue every 5 (five) minutes as needed for chest pain. 04/26/20   Abelino Derrick, PA-C  spironolactone (ALDACTONE) 25 MG tablet TAKE 1/2 TABLET DAILY. PLEASE NOTE DOSAGE CHANGE. Patient taking differently: Take 25 mg by mouth daily. 12/24/19   Runell Gess, MD  traMADol (ULTRAM) 50 MG tablet Take 50 mg by mouth every 6 (six) hours as needed (for pain).     [provider]  traZODone (  DESYREL) 100 MG tablet Take 100 mg by mouth at bedtime. Take 1/2 tablet by mouth at bedtime    [provider]  valsartan (DIOVAN) 160 MG tablet Take 160 mg by mouth daily. 03/06/18   [provider]    Allergies    Penicillins and Sulfa antibiotics  Review of Systems   Review of Systems  All other systems reviewed and are negative.   Physical Exam Updated Vital Signs BP (!) 145/56   Pulse (!) 59   Temp 99 F (37.2 C) (Oral)   Resp 17   Ht 5\' 3"  (1.6 m)   Wt 72.6 kg   SpO2 97%   BMI 28.34 kg/m   Physical Exam Vitals and nursing note reviewed.  Constitutional:      General: She is not in acute distress.    Appearance: Normal appearance. She is well-developed.  HENT:     Head: Normocephalic and atraumatic.  Eyes:     Conjunctiva/sclera: Conjunctivae normal.     Pupils: Pupils are equal, round, and reactive to light.  Cardiovascular:     Rate and Rhythm: Normal rate and regular rhythm.     Heart sounds: Normal heart sounds.  Pulmonary:     Effort: Pulmonary  effort is normal. No respiratory distress.     Breath sounds: Normal breath sounds.  Abdominal:     General: There is no distension.     Palpations: Abdomen is soft.     Tenderness: There is no abdominal tenderness.     Comments: No appreciable tenderness with palpation to the abdomen or to the right flank area.  No overlying rash on the skin of the right flank.  Musculoskeletal:        General: No deformity. Normal range of motion.     Cervical back: Normal range of motion and neck supple.  Skin:    General: Skin is warm and dry.  Neurological:     Mental Status: She is alert and oriented to person, place, and time.     ED Results / Procedures / Treatments   Labs (all labs ordered are listed, but only abnormal results are displayed) Labs Reviewed  URINALYSIS, ROUTINE W REFLEX MICROSCOPIC - Abnormal; Notable for the following components:      Result Value   Color, Urine STRAW (*)    All other components within normal limits  COMPREHENSIVE METABOLIC PANEL - Abnormal; Notable for the following components:   Sodium 134 (*)    Creatinine, Ser 1.34 (*)    Calcium 10.8 (*)    GFR, Estimated 37 (*)    All other components within normal limits  LIPASE, BLOOD - Abnormal; Notable for the following components:   Lipase 64 (*)    All other components within normal limits  CBC WITH DIFFERENTIAL/PLATELET    EKG EKG Interpretation  Date/Time:  Monday June 19 2020 12:54:47 EDT Ventricular Rate:  62 PR Interval:    QRS Duration: 150 QT Interval:  459 QTC Calculation: 467 R Axis:   -61 Text Interpretation: Junctional rhythm Left bundle branch block Confirmed by Kristine RoyalMessick, Brittane Grudzinski [phone] on 06/19/2020 1:12:59 PM   Radiology CT ABDOMEN PELVIS W CONTRAST  Result Date: 06/19/2020 CLINICAL DATA:  Right side abdominal pain. EXAM: CT ABDOMEN AND PELVIS WITH CONTRAST TECHNIQUE: Multidetector CT imaging of the abdomen and pelvis was performed using the standard protocol following bolus  administration of intravenous contrast. CONTRAST:  80 mL OMNIPAQUE IOHEXOL 300 MG/ML  SOLN COMPARISON:  CT abdomen and pelvis  06/27/2018. FINDINGS: Lower chest: Mild dependent atelectasis. No pleural or pericardial effusion. There is cardiomegaly. Pacing leads noted. Hepatobiliary: No focal liver abnormality is seen. Status post cholecystectomy. No biliary dilatation. Pancreas: Unremarkable. No pancreatic ductal dilatation or surrounding inflammatory changes. Spleen: Normal in size without focal abnormality. Adrenals/Urinary Tract: Small bilateral renal cysts are unchanged. The kidneys are otherwise normal appearance. Ureters and urinary bladder appear normal. The adrenal glands are negative. Stomach/Bowel: Stomach is within normal limits. Appendix appears normal. No evidence of bowel wall thickening, distention, or inflammatory changes. Scattered diverticula noted. Vascular/Lymphatic: Aortic atherosclerosis. No enlarged abdominal or pelvic lymph nodes. Reproductive: Uterus and bilateral adnexa are unremarkable. Other: None. Musculoskeletal: No acute or focal abnormality. Right hip replacement noted. IMPRESSION: No acute abnormality abdomen or pelvis. Cardiomegaly. Diverticulosis without diverticulitis. Aortic Atherosclerosis (ICD10-I70.0). Electronically Signed   By: Drusilla Kanner M.D.   On: 06/19/2020 15:06    Procedures Procedures   Medications Ordered in ED Medications  iohexol (OMNIPAQUE) 300 MG/ML solution 80 mL (80 mLs Intravenous Contrast Given 06/19/20 1440)    ED Course  I have reviewed the triage vital signs and the nursing notes.  Pertinent labs & imaging results that were available during my care of the patient were reviewed by me and considered in my medical decision making (see chart for details).    MDM Rules/Calculators/A&P                          MDM  MSE complete  Stephanie Atkinson was evaluated in Emergency Department on 06/19/2020 for the symptoms described in the history  of present illness. She was evaluated in the context of the global COVID-19 pandemic, which necessitated consideration that the patient might be at risk for infection with the SARS-CoV-2 virus that causes COVID-19. Institutional protocols and algorithms that pertain to the evaluation of patients at risk for COVID-19 are in a state of rapid change based on information released by regulatory bodies including the CDC and federal and state organizations. These policies and algorithms were followed during the patient's care in the ED.  Patient is presenting for evaluation of reported right flank discomfort.  Patient's reported symptoms are somewhat chronic in nature.  Work-up obtained today is without significant acute pathology found.  Patient's symptoms may be resulting from degenerative disease in her back.  Patient is comfortable at time of discharge.  She desires discharge home.  She understands need for close follow-up with both her regular doctors and her orthopedist.  She plans on using Tylenol and tramadol together for relief of her symptoms.  Importance of close follow-up is stressed.  Strict return precautions given understood. Final Clinical Impression(s) / ED Diagnoses Final diagnoses:  Right flank pain    Rx / DC Orders ED Discharge Orders    None       Wynetta Fines, MD 06/19/20 1528

## 2020-06-19 NOTE — ED Notes (Signed)
Patients daughter called for discharge, no answer.  Waiting to get wheelchair.

## 2020-06-19 NOTE — ED Triage Notes (Signed)
Patient presents to the ED via [facility] from home for right sided flank pain.  Patient states that the pain is worse when she wakes in the morning and then pain is more tolerable throughout the day.  Describes the pain as a pressure.  Patient has a cardiac history and has a non demand pacemaker. BP: 176/70

## 2020-06-20 ENCOUNTER — Telehealth: Payer: Self-pay

## 2020-06-20 DIAGNOSIS — G8929 Other chronic pain: Secondary | ICD-10-CM | POA: Diagnosis not present

## 2020-06-20 DIAGNOSIS — M7918 Myalgia, other site: Secondary | ICD-10-CM | POA: Diagnosis not present

## 2020-06-20 NOTE — Telephone Encounter (Signed)
Received denial letter from [facility] for Monovisc, right knee due to not meeting medical requirements.

## 2020-06-21 NOTE — Progress Notes (Signed)
Cardiology Clinic Note   Patient Name: Stephanie Atkinson Date of Encounter: 06/27/2020  Primary Care Provider:  Pearson Grippe, MD Primary Cardiologist:  Nanetta Batty, MD  Patient Profile    Stephanie Atkinson, 87 y.o. female presents to the clinic for a follow up evaluation of her HTN.  Past Medical History    Past Medical History:  Diagnosis Date  . Chronic kidney disease, stage 3 (HCC)   . Coronary artery disease   . Hx of CABG 2006   LIMA-LAD, free RIMA-PDA, Radial-OM1-OM2  . Hyperlipidemia   . Hypertension   . Hypothyroid   . MI, old       . PAF (paroxysmal atrial fibrillation) (HCC)    was on amio, d/c'd due to prolonged PR interval, rate control  . Presence of permanent cardiac pacemaker 07/16/2017  . Presence of stent in right coronary artery 07/12   RIMA-PDA occluded, DES stent placed  . Syncope 07/12   bradycardic, beta blocker decreased, also felt to be dehydrated   Past Surgical History:  Procedure Laterality Date  . CARDIAC CATHETERIZATION  161096   dr. Onalee Hua harding, revealing a atretic bypass to her right with patent distal RCA stent  . CAROTID STENT  2012  . CORONARY ARTERY BYPASS GRAFT  2006   LIMA-LAD, free RIMA-PDA, Radial-OM1-OM2  . DOPPLER ECHOCARDIOGRAPHY  045409   mild asymmetric left ventricular hypertrophy, left ventricular systolic function is low normal, ejection fraction = 50-55%, the LA is moderate dilated, the RA is mildly dilated, no significant valvular disease  . INSERT / REPLACE / REMOVE PACEMAKER  07/16/2017  . JOINT REPLACEMENT     hip replacement  . LEFT HEART CATHETERIZATION WITH CORONARY/GRAFT ANGIOGRAM N/A 10/12/2012   Procedure: LEFT HEART CATHETERIZATION WITH Isabel Caprice;  Surgeon: Marykay Lex, MD;  Location: [facility] CATH LAB;  Service: Cardiovascular;  Laterality: N/A;  . LOOP RECORDER IMPLANT N/A 10/15/2012   Procedure: LOOP RECORDER IMPLANT;  Surgeon: Thurmon Fair, MD;  Location: [facility] CATH LAB;  Service: Cardiovascular;   Laterality: N/A;  . LOOP RECORDER REMOVAL  07/16/2017  . LOOP RECORDER REMOVAL N/A 07/16/2017   Procedure: LOOP RECORDER REMOVAL;  Surgeon: Thurmon Fair, MD;  Location: [facility] INVASIVE CV LAB;  Service: Cardiovascular;  Laterality: N/A;  . NM MYOVIEW LTD  100510   post stress left ventricle is normal in size, post stress ejection fraction is 67% global left ventricular systolic function is normal, normal myocardial perfusion study, abnormal myocardial perfusion study, low risk scan  . PACEMAKER IMPLANT N/A 07/16/2017   Procedure: PACEMAKER IMPLANT;  Surgeon: Thurmon Fair, MD;  Location: [facility] INVASIVE CV LAB;  Service: Cardiovascular;  Laterality: N/A;  . THYROIDECTOMY      Allergies  Allergies  Allergen Reactions  . Penicillins Hives    Did it involve swelling of the face/tongue/throat, SOB, or low BP? No Did it involve sudden or severe rash/hives, skin peeling, or any reaction on the inside of your mouth or nose? No Did you need to seek medical attention at a hospital or doctor's office? No When did it last happen? Unk If all above answers are "NO", may proceed with cephalosporin use.   . Sulfa Antibiotics Hives and Swelling    History of Present Illness    Stephanie Atkinson has a PMH of coronary artery disease status post CABG x4 3/06.  She underwent cardiac catheterization with PCI and DES to her PDA 7/12.  She underwent follow-up cardiac catheterization 10/12 and again 8/14.  She  was noted to have distal disease and medical management was recommended.  On follow-up in the office 5/14 she was noted to have a long first-degree AV block.  Her PR interval was greater than 4 on her EKG.  She was asymptomatic with this.  Her PMH also includes PAF since her CABG in 2006.  At that time her metoprolol was stopped and her amiodarone was decreased.  On follow-up she was noted to have continued PR prolongation and her amiodarone was discontinued.  During follow-up 8/14 she was noted to be in AF with  controlled ventricular rate.  She was placed on Eliquis at that time her Plavix was stopped.  A few days later she was admitted with chest pain and EKG changes were noted.  She underwent cardiac catheterization 10/12/2012 which showed no changes in her coronary anatomy.  She was readmitted on the 19th with symptoms of syncope.  A loop recorder was implanted 10/14/2012.  She was noted to be mildly dehydrated and had started new nitrate therapy.  It was felt that these were also contributing to her syncope.  She had complaints of fatigue and was noted to have slow ventricular response.  She was not on any chronotropic medications.  Due to her increased dyspnea on exertion and slow heart rate she underwent permanent transvenous pacemaker implantation by Dr. Royann Shivers 07/16/2017.  Her symptoms clinically improved.  She was admitted with community-acquired pneumonia 4/21 for 5 days and was also noted to have diastolic heart failure.  She was treated with diuresis and IV antibiotics.  She followed up with Dr. Allyson Sabal on 07/16/2019 and did not chest pain shortness of breath.  She was seen virtually 11/22/19 in follow-up and stated she felt well.  She stated that she did have some osteoarthritis in her right foot and knee.  She indicated that her right knee and foot did swell.  She monitored her blood pressure which she stated was up and down.  It was 113/80.  She continued to live independently, do all of her ADLs, laundry, and housework.  She stated that until COVID-19 she was walking several days per week.  She continued to walk but not as religiously due to the virus.  I planned follow-up with myself or Dr. Allyson Sabal in 6 months and provide her with the salty 6 diet sheet.  She was seen by Dr. Royann Shivers on 05/22/2020.  She was doing well at that time.  She reported that she had tripped on a curb and almost fallen.  She managed to catch her self.  She denied any bleeding issues.  She remained active.  Her pacemaker  documented 1-1/2 hours of movement daily.  She denied lower extremity swelling, orthopnea, PND, exertional dyspnea and chest discomfort.  Her pacemaker interrogation showed normal device function.  At that time she was not pacemaker dependent.  Her underlying rhythm was atrial fibrillation with ventricular rate 45-50 bpm.  She presents to the clinic today for follow up evaluation and states he feels well and continues to stay very physically active.  She reports that she even exercises when she is sitting.  She reports that she has some ongoing right back pain.  She reports that she has had this for several years and had a back injury several years ago.  She reports some occasional tightness across her right lower back along with burning pain that shoots down to her right buttocks.  She otherwise has no cardiac complaints.  We reviewed her emergency department note.  I recommended that she do some light stretching and agility type exercises for her low back, she may use Voltaren/diclofenac topical cream and I will have her follow-up in 6 months.  Today she denies chest pain, shortness of breath, lower extremity edema, fatigue, palpitations, melena, hematuria, hemoptysis, diaphoresis, weakness, presyncope, syncope, orthopnea, and PND.  Home Medications    Prior to Admission medications   Medication Sig Start Date End Date Taking? Authorizing Provider  acetaminophen (TYLENOL) 500 MG tablet Take 1 tablet (500 mg total) by mouth every 6 (six) hours as needed for moderate pain. 06/27/19   Briant CedarEzenduka, Nkeiruka J, MD  amLODipine (NORVASC) 10 MG tablet TAKE 1 TABLET BY MOUTH EVERY DAY 12/16/19   Runell GessBerry, Jonathan J, MD  atorvastatin (LIPITOR) 80 MG tablet Take 80 mg by mouth at bedtime.     [provider]  cholecalciferol (VITAMIN D3) 25 MCG (1000 UT) tablet Take 1,000 Units by mouth daily.    [provider]  cyclobenzaprine (FLEXERIL) 10 MG tablet 1/2 - 1 tablet 05/30/16   [provider]  diphenhydrAMINE (BENADRYL) 25 MG tablet Take 25 mg by mouth every 6 (six) hours as needed for allergies.    [provider]  ELIQUIS 5 MG TABS tablet TAKE 1 TABLET BY MOUTH TWICE A DAY 02/11/20   Runell GessBerry, Jonathan J, MD  furosemide (LASIX) 20 MG tablet TAKE 1 TABLET BY MOUTH EVERY DAY 12/24/19   Runell GessBerry, Jonathan J, MD  hydroxypropyl methylcellulose / hypromellose (ISOPTO TEARS / GONIOVISC) 2.5 % ophthalmic solution Place 1 drop into both eyes 3 (three) times daily as needed for dry eyes.    [provider]  levothyroxine (SYNTHROID, LEVOTHROID) 50 MCG tablet Take 50 mcg by mouth daily before breakfast.     [provider]  nitroGLYCERIN (NITROSTAT) 0.4 MG SL tablet Place 1 tablet (0.4 mg total) under the tongue every 5 (five) minutes as needed for chest pain. 04/26/20   Abelino DerrickKilroy, Luke K, PA-C  spironolactone (ALDACTONE) 25 MG tablet TAKE 1/2 TABLET DAILY. PLEASE NOTE DOSAGE CHANGE. Patient taking differently: Take 25 mg by mouth daily. 12/24/19   Runell GessBerry, Jonathan J, MD  traMADol (ULTRAM) 50 MG tablet Take 50 mg by mouth every 6 (six) hours as needed (for pain).     [provider]  traZODone (DESYREL) 100 MG tablet Take 100 mg by mouth at bedtime. Take 1/2 tablet by mouth at bedtime    [provider]  valsartan (DIOVAN) 160 MG tablet Take 160 mg by mouth daily. 03/06/18   [provider]    Family History    Family History  Problem Relation Age of Onset  . CAD Mother   . CAD Maternal Grandmother    She indicated that her mother is deceased. She indicated that her father is deceased. She indicated that the status of her maternal grandmother is unknown.  Social History    Social History   Socioeconomic History  . Marital status: Single    Spouse name: Not on file  . Number of children: Not on file  . Years of education: Not on file  . Highest education level: Not on file  Occupational History  . Not on file  Tobacco Use  . Smoking  status: Never Smoker  . Smokeless tobacco: Current User    Types: Snuff  Vaping Use  . Vaping Use: Never used  Substance and Sexual Activity  . Alcohol use: No  . Drug use: No  . Sexual activity: Not on  file  Other Topics Concern  . Not on file  Social History Narrative  . Not on file   Social Determinants of Health   Financial Resource Strain: Not on file  Food Insecurity: Not on file  Transportation Needs: Not on file  Physical Activity: Not on file  Stress: Not on file  Social Connections: Not on file  Intimate Partner Violence: Not on file     Review of Systems    General:  No chills, fever, night sweats or weight changes.  Cardiovascular:  No chest pain, dyspnea on exertion, edema, orthopnea, palpitations, paroxysmal nocturnal dyspnea. Dermatological: No rash, lesions/masses Respiratory: No cough, dyspnea Urologic: No hematuria, dysuria Abdominal:   No nausea, vomiting, diarrhea, bright red blood per rectum, melena, or hematemesis Neurologic:  No visual changes, wkns, changes in mental status. All other systems reviewed and are otherwise negative except as noted above.  Physical Exam    VS:  BP 134/72   Pulse 76   Ht 5\' 3"  (1.6 m)   Wt 157 lb 9.6 oz (71.5 kg)   SpO2 95%   BMI 27.92 kg/m  , BMI Body mass index is 27.92 kg/m. GEN: Well nourished, well developed, in no acute distress. HEENT: normal. Neck: Supple, no JVD, carotid bruits, or masses. Cardiac: RRR, no murmurs, rubs, or gallops. No clubbing, cyanosis, edema.  Radials/DP/PT 2+ and equal bilaterally.  Respiratory:  Respirations regular and unlabored, clear to auscultation bilaterally. GI: Soft, nontender, nondistended, BS + x 4. MS: no deformity or atrophy.  Right flank discomfort Skin: warm and dry, no rash. Neuro:  Strength and sensation are intact. Psych: Normal affect.  Accessory Clinical Findings    Recent Labs: 06/19/2020: ALT 30; BUN 22; Creatinine, Ser 1.34; Hemoglobin 12.6; Platelets  204; Potassium 4.8; Sodium 134   Recent Lipid Panel    Component Value Date/Time   CHOL 127 11/25/2016 1005   TRIG 117 11/25/2016 1005   HDL 46 11/25/2016 1005   CHOLHDL 2.8 11/25/2016 1005   CHOLHDL 2.6 01/05/2015 1118   VLDL 18 01/05/2015 1118   LDLCALC 58 11/25/2016 1005    ECG personally reviewed by me today-none today.  Echocardiogram 06/26/2019 IMPRESSIONS    1. Septal dyssynergy due to ventricular paciing; biatrial enlargement;  mild MR and TR.  2. Left ventricular ejection fraction, by estimation, is 50 to 55%. The  left ventricle has low normal function. The left ventricle has no regional  wall motion abnormalities. Left ventricular diastolic parameters are  indeterminate. Elevated left atrial  pressure.  3. Right ventricular systolic function is normal. The right ventricular  size is normal. There is mildly elevated pulmonary artery systolic  pressure.  4. Left atrial size was severely dilated.  5. Right atrial size was moderately dilated.  6. The mitral valve is normal in structure. Mild mitral valve  regurgitation. No evidence of mitral stenosis.  7. The aortic valve is tricuspid. Aortic valve regurgitation is not  visualized. Mild aortic valve sclerosis is present, with no evidence of  aortic valve stenosis.  8. The inferior vena cava is normal in size with greater than 50%  respiratory variability, suggesting right atrial pressure of 3 mmHg.  Assessment & Plan   1.    Essential hypertension-BP today 134/72.  Well-controlled at home. Continue amlodipine, valsartan, spironolactone Heart healthy low-sodium diet-salty 6 given Increase physical activity as tolerated  Coronary artery disease- denies recent episodes of chest discomfort.  Status post CABG x4 3/6.  Underwent repeat cardiac catheterization 8/14 which  showed unchanged coronary anatomy. Continue atorvastatin, amlodipine Heart healthy low-sodium diet-salty 6 given Increase physical activity  as tolerated  Atrial fibrillation-permanent.  Heart rate today 91 underwent pacemaker implantation by Dr. Royann Shivers 5/19. Continue Eliquis Heart healthy low-sodium diet-salty 6 given Increase physical activity as tolerated  Hyperlipidemia-LDL 51 on 01/12/20 Continue atorvastatin Heart healthy low-sodium diet-salty 6 given Increase physical activity as tolerated Followed by PCP  Right back pain- chronic in nature.  Reports remote back injury and chronic breast discomfort since that time.  Appears to be nerve and muscular in nature. May use diclofenac topical cream Light stretching and agility type exercises  Disposition: Follow-up with Dr. Allyson Sabal  in 6 months.   Thomasene Ripple. Meliana Canner NP-C    06/27/2020, 3:56 PM [facility] [address] Office [phone] Fax [phone]  Notice: This dictation was prepared with Dragon dictation along with smaller phrase technology. Any transcriptional errors that result from this process are unintentional and may not be corrected upon review.  I spent  15 minutes examining this patient, reviewing medications, and using patient centered shared decision making involving her cardiac care.  Prior to her visit I spent greater than 20 minutes reviewing her past medical history,  medications, and prior cardiac tests.

## 2020-06-22 ENCOUNTER — Other Ambulatory Visit: Payer: Self-pay | Admitting: Cardiovascular Disease

## 2020-06-27 ENCOUNTER — Encounter: Payer: Self-pay | Admitting: General Practice

## 2020-06-27 ENCOUNTER — Other Ambulatory Visit: Payer: Self-pay

## 2020-06-27 ENCOUNTER — Ambulatory Visit: Payer: Medicare PPO | Admitting: General Practice

## 2020-06-27 VITALS — BP 134/72 | HR 76 | Ht 63.0 in | Wt 157.6 lb

## 2020-06-27 DIAGNOSIS — I4821 Permanent atrial fibrillation: Secondary | ICD-10-CM

## 2020-06-27 DIAGNOSIS — I25810 Atherosclerosis of coronary artery bypass graft(s) without angina pectoris: Secondary | ICD-10-CM | POA: Diagnosis not present

## 2020-06-27 DIAGNOSIS — I10 Essential (primary) hypertension: Secondary | ICD-10-CM

## 2020-06-27 DIAGNOSIS — R109 Unspecified abdominal pain: Secondary | ICD-10-CM

## 2020-06-27 DIAGNOSIS — E7800 Pure hypercholesterolemia, unspecified: Secondary | ICD-10-CM | POA: Diagnosis not present

## 2020-06-27 NOTE — Patient Instructions (Signed)
Medication Instructions:  MAY USE VOLTAREN GEL FOR PAIN *If you need a refill on your cardiac medications before your next appointment, please call your pharmacy*  Special Instructions DO LIGHT STRETCHING TO BACK/HAMSTRING  YOU MAY USE EUCERIN, CETAPHIL AND LUBRIDERM LOTION FOR DRY SKIN  Follow-Up: Your next appointment:  6 month(s) In Person with Nanetta Batty, MD ONLY  Please call our office 2 months in advance to schedule this appointment   At [facility], you and your health needs are our priority.  As part of our continuing mission to provide you with exceptional heart care, we have created designated Provider Care Teams.  These Care Teams include your primary Cardiologist (physician) and Advanced Practice Providers (APPs -  Physician Assistants and Nurse Practitioners) who all work together to provide you with the care you need, when you need it.  We recommend signing up for the patient portal called "MyChart".  Sign up information is provided on this After Visit Summary.  MyChart is used to connect with patients for Virtual Visits (Telemedicine).  Patients are able to view lab/test results, encounter notes, upcoming appointments, etc.  Non-urgent messages can be sent to your provider as well.   To learn more about what you can do with MyChart, go to [email or web address].

## 2020-06-28 ENCOUNTER — Telehealth: Payer: Self-pay

## 2020-06-28 NOTE — Telephone Encounter (Signed)
Called and left a VM advising patient to make an appointment to see Zonia Kief, due to gel injection being denied for some medical requirements.

## 2020-07-04 DIAGNOSIS — M8580 Other specified disorders of bone density and structure, unspecified site: Secondary | ICD-10-CM | POA: Diagnosis not present

## 2020-07-04 DIAGNOSIS — E559 Vitamin D deficiency, unspecified: Secondary | ICD-10-CM | POA: Diagnosis not present

## 2020-07-04 DIAGNOSIS — E063 Autoimmune thyroiditis: Secondary | ICD-10-CM | POA: Diagnosis not present

## 2020-07-04 DIAGNOSIS — Z008 Encounter for other general examination: Secondary | ICD-10-CM | POA: Diagnosis not present

## 2020-07-04 DIAGNOSIS — E785 Hyperlipidemia, unspecified: Secondary | ICD-10-CM | POA: Diagnosis not present

## 2020-07-10 DIAGNOSIS — E663 Overweight: Secondary | ICD-10-CM | POA: Diagnosis not present

## 2020-07-10 DIAGNOSIS — Z008 Encounter for other general examination: Secondary | ICD-10-CM | POA: Diagnosis not present

## 2020-07-10 DIAGNOSIS — E063 Autoimmune thyroiditis: Secondary | ICD-10-CM | POA: Diagnosis not present

## 2020-07-10 DIAGNOSIS — E785 Hyperlipidemia, unspecified: Secondary | ICD-10-CM | POA: Diagnosis not present

## 2020-07-14 ENCOUNTER — Ambulatory Visit (INDEPENDENT_AMBULATORY_CARE_PROVIDER_SITE_OTHER): Payer: Medicare PPO

## 2020-07-14 DIAGNOSIS — I4821 Permanent atrial fibrillation: Secondary | ICD-10-CM

## 2020-07-17 DIAGNOSIS — I5032 Chronic diastolic (congestive) heart failure: Secondary | ICD-10-CM | POA: Diagnosis not present

## 2020-07-17 DIAGNOSIS — I129 Hypertensive chronic kidney disease with stage 1 through stage 4 chronic kidney disease, or unspecified chronic kidney disease: Secondary | ICD-10-CM | POA: Diagnosis not present

## 2020-07-17 DIAGNOSIS — I480 Paroxysmal atrial fibrillation: Secondary | ICD-10-CM | POA: Diagnosis not present

## 2020-07-17 DIAGNOSIS — N184 Chronic kidney disease, stage 4 (severe): Secondary | ICD-10-CM | POA: Diagnosis not present

## 2020-07-17 DIAGNOSIS — I2510 Atherosclerotic heart disease of native coronary artery without angina pectoris: Secondary | ICD-10-CM | POA: Diagnosis not present

## 2020-07-17 DIAGNOSIS — E785 Hyperlipidemia, unspecified: Secondary | ICD-10-CM | POA: Diagnosis not present

## 2020-07-17 DIAGNOSIS — E039 Hypothyroidism, unspecified: Secondary | ICD-10-CM | POA: Diagnosis not present

## 2020-07-18 LAB — CUP PACEART REMOTE DEVICE CHECK
Battery Remaining Longevity: 109 mo
Battery Voltage: 3 V
Brady Statistic RV Percent Paced: 98.82 %
Date Time Interrogation Session: 20220520004341
Implantable Lead Implant Date: 20190522
Implantable Lead Location: 753860
Implantable Lead Model: 5076
Implantable Pulse Generator Implant Date: 20190522
Lead Channel Impedance Value: 361 Ohm
Lead Channel Impedance Value: 456 Ohm
Lead Channel Pacing Threshold Amplitude: 0.5 V
Lead Channel Pacing Threshold Pulse Width: 0.4 ms
Lead Channel Sensing Intrinsic Amplitude: 7.125 mV
Lead Channel Sensing Intrinsic Amplitude: 7.125 mV
Lead Channel Setting Pacing Amplitude: 2.5 V
Lead Channel Setting Pacing Pulse Width: 0.4 ms
Lead Channel Setting Sensing Sensitivity: 1.2 mV

## 2020-07-31 NOTE — Progress Notes (Signed)
Remote pacemaker transmission.   

## 2020-08-08 DIAGNOSIS — N184 Chronic kidney disease, stage 4 (severe): Secondary | ICD-10-CM | POA: Diagnosis not present

## 2020-08-14 DIAGNOSIS — R600 Localized edema: Secondary | ICD-10-CM | POA: Diagnosis not present

## 2020-08-14 DIAGNOSIS — E785 Hyperlipidemia, unspecified: Secondary | ICD-10-CM | POA: Diagnosis not present

## 2020-08-14 DIAGNOSIS — I129 Hypertensive chronic kidney disease with stage 1 through stage 4 chronic kidney disease, or unspecified chronic kidney disease: Secondary | ICD-10-CM | POA: Diagnosis not present

## 2020-08-14 DIAGNOSIS — I5032 Chronic diastolic (congestive) heart failure: Secondary | ICD-10-CM | POA: Diagnosis not present

## 2020-08-14 DIAGNOSIS — N1832 Chronic kidney disease, stage 3b: Secondary | ICD-10-CM | POA: Diagnosis not present

## 2020-08-16 ENCOUNTER — Other Ambulatory Visit: Payer: Self-pay | Admitting: Cardiovascular Disease

## 2020-08-16 NOTE — Telephone Encounter (Signed)
Prescription refill request for Eliquis received. Indication:atrial fib Last office visit:5/22 Scr:1.3 Age: 87 Weight:71.5 kg  Prescription refilled

## 2020-09-06 DIAGNOSIS — I10 Essential (primary) hypertension: Secondary | ICD-10-CM | POA: Diagnosis not present

## 2020-09-06 DIAGNOSIS — F419 Anxiety disorder, unspecified: Secondary | ICD-10-CM | POA: Diagnosis not present

## 2020-09-06 DIAGNOSIS — I5032 Chronic diastolic (congestive) heart failure: Secondary | ICD-10-CM | POA: Diagnosis not present

## 2020-09-06 DIAGNOSIS — I480 Paroxysmal atrial fibrillation: Secondary | ICD-10-CM | POA: Diagnosis not present

## 2020-09-12 ENCOUNTER — Ambulatory Visit: Payer: Medicare PPO | Admitting: Cardiovascular Disease

## 2020-09-12 ENCOUNTER — Encounter: Payer: Self-pay | Admitting: Cardiovascular Disease

## 2020-09-12 ENCOUNTER — Other Ambulatory Visit: Payer: Self-pay

## 2020-09-12 DIAGNOSIS — I4821 Permanent atrial fibrillation: Secondary | ICD-10-CM

## 2020-09-12 DIAGNOSIS — E785 Hyperlipidemia, unspecified: Secondary | ICD-10-CM

## 2020-09-12 DIAGNOSIS — Z7901 Long term (current) use of anticoagulants: Secondary | ICD-10-CM

## 2020-09-12 DIAGNOSIS — Z951 Presence of aortocoronary bypass graft: Secondary | ICD-10-CM | POA: Diagnosis not present

## 2020-09-12 DIAGNOSIS — I10 Essential (primary) hypertension: Secondary | ICD-10-CM

## 2020-09-12 NOTE — Assessment & Plan Note (Signed)
History of atrial fibrillation in the past with slow ventricular response status post permanent transvenous pacemaker insertion by Dr. Royann Shivers 07/16/2017 with a [employer] Azor SR.  Her symptoms improved at that time.

## 2020-09-12 NOTE — Assessment & Plan Note (Signed)
History of permanent A. fib on Eliquis oral anticoagulation. 

## 2020-09-12 NOTE — Assessment & Plan Note (Signed)
History of essential hypertension a blood pressure measured today at 132/64.  She is on amlodipine and valsartan.

## 2020-09-12 NOTE — Progress Notes (Signed)
09/12/2020 Stephanie Atkinson   05/05/1924  [id number]  Primary Physician Pearson Grippe, MD Primary Cardiologist: Runell Gess MD Milagros Loll, [place], [place]  HPI:  Stephanie Atkinson is a 87 y.o.  widowed Caucasian female mother of 7 children, grandmother of 7 grandchildren who I last saw in the office 07/16/2019.[name] She has a history of CAD who I last saw in the office 08/24/16.[name] She had CABG X 4 in 3/06. She had a DES placed to her PDA in July 2012, this was patent on follow up cath in Oct 2012 and recently 10/12/12. She did have distal disease that is to be treated medically. She h in the office 07/10/12 and was noted to have a long 1st degree AVB on her EKG, her PR was > 4. She was asymptomatic. She has a history of PAF since her CABG in 2006 and had been on Toprol, Amiodarone, ASA, and Plavix. At that time we stopped her Toprol and decreased her Amiodarone. When she was seen in follow up 07/29/12 her PR remained prolonged her Amiodarone was discontinued. On 10/06/12 she was seen and noted to be in AF with CVR. She was placed on Eloquis and her Plavix was Stopped. She the was admitted a few days later with chest pain with subtle EKG changes. She was cathed 10/12/12 and her anatomy was unchanged. She was discharge but readmitted later the 19th after a syncopal spell. She had a loop recorder implanted 10/14/12. She was noted to be mildly dehydrated and this, as well as new nitrate therapy, may have contributed to her syncope.   She was complaining of fatigue and had a slow ventricular response to her atrial fibrillation despite being on no negative chronotropic drugs.  Because of increasing dyspnea on exertion and her slow heart rate she underwent a permanent transvenous pacemaker implant by Dr. Royann Shivers O5/22/19 Medtronic Azure SR.  She feels clinically improved.  She currently denies chest pain or shortness of breath.  She lives alone but she has 2 of her children in close proximity.  She eats a healthy diet and  exercises daily by walking in the neighborhood.   She was admitted with a community-acquired pneumonia 06/22/2019 for 5 days along with diastolic heart failure.  She was treated for this and diuresed.    Since I saw her a year ago she has done well.  She has not been hospitalized since.  She denies chest pain or shortness of breath.  She does wear compression stockings and has some mild peripheral edema.  Her major complaints are of right knee pain.  Current Meds  Medication Sig   acetaminophen (TYLENOL) 500 MG tablet Take 1 tablet (500 mg total) by mouth every 6 (six) hours as needed for moderate pain.   amLODipine (NORVASC) 10 MG tablet Take 1 tablet (10 mg total) by mouth daily.   atorvastatin (LIPITOR) 80 MG tablet Take 80 mg by mouth at bedtime.    cholecalciferol (VITAMIN D3) 25 MCG (1000 UT) tablet Take 1,000 Units by mouth daily.   cyclobenzaprine (FLEXERIL) 10 MG tablet 1/2 - 1 tablet   diphenhydrAMINE (BENADRYL) 25 MG tablet Take 25 mg by mouth every 6 (six) hours as needed for allergies.   ELIQUIS 5 MG TABS tablet TAKE 1 TABLET BY MOUTH TWICE A DAY   furosemide (LASIX) 20 MG tablet TAKE 1 TABLET BY MOUTH EVERY DAY   hydroxypropyl methylcellulose / hypromellose (ISOPTO TEARS / GONIOVISC) 2.5 % ophthalmic solution Place 1 drop  into both eyes 3 (three) times daily as needed for dry eyes.   hydrOXYzine (ATARAX/VISTARIL) 25 MG tablet Take 25 mg by mouth 2 (two) times daily as needed.   levothyroxine (SYNTHROID, LEVOTHROID) 50 MCG tablet Take 50 mcg by mouth daily before breakfast.    nebivolol (BYSTOLIC) 5 MG tablet Take 5 mg by mouth daily.   nitroGLYCERIN (NITROSTAT) 0.4 MG SL tablet Place 1 tablet (0.4 mg total) under the tongue every 5 (five) minutes as needed for chest pain.   spironolactone (ALDACTONE) 25 MG tablet TAKE 1/2 TABLET DAILY. PLEASE NOTE DOSAGE CHANGE. (Patient taking differently: Take 25 mg by mouth daily.)   traMADol (ULTRAM) 50 MG tablet Take 50 mg by mouth every 6  (six) hours as needed (for pain).    valsartan (DIOVAN) 160 MG tablet Take 160 mg by mouth daily.     Allergies  Allergen Reactions   Penicillins Hives    Did it involve swelling of the face/tongue/throat, SOB, or low BP? No Did it involve sudden or severe rash/hives, skin peeling, or any reaction on the inside of your mouth or nose? No Did you need to seek medical attention at a hospital or doctor's office? No When did it last happen? Unk    If all above answers are "NO", may proceed with cephalosporin use.    Sulfa Antibiotics Hives and Swelling    Social History   Socioeconomic History   Marital status: Single    Spouse name: Not on file   Number of children: Not on file   Years of education: Not on file   Highest education level: Not on file  Occupational History   Not on file  Tobacco Use   Smoking status: Never   Smokeless tobacco: Current    Types: Snuff  Vaping Use   Vaping Use: Never used  Substance and Sexual Activity   Alcohol use: No   Drug use: No   Sexual activity: Not on file  Other Topics Concern   Not on file  Social History Narrative   Not on file   Social Determinants of Health   Financial Resource Strain: Not on file  Food Insecurity: Not on file  Transportation Needs: Not on file  Physical Activity: Not on file  Stress: Not on file  Social Connections: Not on file  Intimate Partner Violence: Not on file     Review of Systems: General: negative for chills, fever, night sweats or weight changes.  Cardiovascular: negative for chest pain, dyspnea on exertion, edema, orthopnea, palpitations, paroxysmal nocturnal dyspnea or shortness of breath Dermatological: negative for rash Respiratory: negative for cough or wheezing Urologic: negative for hematuria Abdominal: negative for nausea, vomiting, diarrhea, bright red blood per rectum, melena, or hematemesis Neurologic: negative for visual changes, syncope, or dizziness All other systems reviewed  and are otherwise negative except as noted above.    Blood pressure 132/64, pulse 70, height 5\' 3"  (1.6 m), weight 154 lb (69.9 kg).  General appearance: alert and no distress Neck: no adenopathy, no carotid bruit, no JVD, supple, symmetrical, trachea midline, and thyroid not enlarged, symmetric, no tenderness/mass/nodules Lungs: clear to auscultation bilaterally Heart: regular rate and rhythm, S1, S2 normal, no murmur, click, rub or gallop Extremities: 1-2+ pitting edema bilaterally Pulses: 2+ and symmetric Skin: Skin color, texture, turgor normal. No rashes or lesions Neurologic: Grossly normal  EKG not performed today  ASSESSMENT AND PLAN:   Hypertension History of essential hypertension a blood pressure measured today at 132/64.  She is on amlodipine and valsartan.  Hx of CABG X 4 3/06-  History of CAD status post CABG x4 3/06.  She had stenting to her PDA in July 2012.  She denies chest pain or shortness of breath.  Long term (current) use of anticoagulants History of atrial fibrillation in the past with slow ventricular response status post permanent transvenous pacemaker insertion by Dr. Royann Shivers 07/16/2017 with a Medtronic Azor SR.  Her symptoms improved at that time.  Dyslipidemia History of dyslipidemia on statin therapy with lipid profile performed 01/12/2020 revealing total cholesterol 118, LDL 51 and HDL 45.  Permanent atrial fibrillation (HCC) History of permanent A. fib on Eliquis oral anticoagulation.     Runell Gess MD FACP,FACC,FAHA, [facility] 09/12/2020 4:11 PM

## 2020-09-12 NOTE — Assessment & Plan Note (Signed)
History of CAD status post CABG x4 3/06.  She had stenting to her PDA in July 2012.  She denies chest pain or shortness of breath.

## 2020-09-12 NOTE — Assessment & Plan Note (Signed)
History of dyslipidemia on statin therapy with lipid profile performed 01/12/2020 revealing total cholesterol 118, LDL 51 and HDL 45.

## 2020-09-12 NOTE — Patient Instructions (Signed)
Medication Instructions:  The current medical regimen is effective;  continue present plan and medications.  *If you need a refill on your cardiac medications before your next appointment, please call your pharmacy*  Follow-Up: At [facility], you and your health needs are our priority.  As part of our continuing mission to provide you with exceptional heart care, we have created designated Provider Care Teams.  These Care Teams include your primary Cardiologist (physician) and Advanced Practice Providers (APPs -  Physician Assistants and Nurse Practitioners) who all work together to provide you with the care you need, when you need it.  We recommend signing up for the patient portal called "MyChart".  Sign up information is provided on this After Visit Summary.  MyChart is used to connect with patients for Virtual Visits (Telemedicine).  Patients are able to view lab/test results, encounter notes, upcoming appointments, etc.  Non-urgent messages can be sent to your provider as well.   To learn more about what you can do with MyChart, go to [email or web address].    Your next appointment:   6 month(s)  The format for your next appointment:   In Person  Provider:   You will see one of the following Advanced Practice Providers on your designated Care Team:   Callie Goodrich, PA-C Jesse Cleaver, FNP  Then, Jonathan Berry, MD will plan to see you again in 12 month(s).      

## 2020-09-27 DIAGNOSIS — R82998 Other abnormal findings in urine: Secondary | ICD-10-CM | POA: Diagnosis not present

## 2020-09-27 DIAGNOSIS — M791 Myalgia, unspecified site: Secondary | ICD-10-CM | POA: Diagnosis not present

## 2020-09-27 DIAGNOSIS — G8929 Other chronic pain: Secondary | ICD-10-CM | POA: Diagnosis not present

## 2020-09-28 ENCOUNTER — Encounter (HOSPITAL_COMMUNITY): Payer: Self-pay | Admitting: Emergency Medicine

## 2020-09-28 ENCOUNTER — Emergency Department (HOSPITAL_COMMUNITY)
Admission: EM | Admit: 2020-09-28 | Discharge: 2020-09-28 | Disposition: A | Discharge status: Home / Self Care | Payer: Medicare PPO | Attending: Emergency Medicine | Admitting: Emergency Medicine

## 2020-09-28 ENCOUNTER — Emergency Department (HOSPITAL_COMMUNITY): Payer: Medicare PPO

## 2020-09-28 DIAGNOSIS — Z951 Presence of aortocoronary bypass graft: Secondary | ICD-10-CM | POA: Diagnosis not present

## 2020-09-28 DIAGNOSIS — N183 Chronic kidney disease, stage 3 unspecified: Secondary | ICD-10-CM | POA: Diagnosis not present

## 2020-09-28 DIAGNOSIS — I5032 Chronic diastolic (congestive) heart failure: Secondary | ICD-10-CM | POA: Diagnosis not present

## 2020-09-28 DIAGNOSIS — M545 Low back pain, unspecified: Secondary | ICD-10-CM

## 2020-09-28 DIAGNOSIS — Z7901 Long term (current) use of anticoagulants: Secondary | ICD-10-CM | POA: Diagnosis not present

## 2020-09-28 DIAGNOSIS — F17220 Nicotine dependence, chewing tobacco, uncomplicated: Secondary | ICD-10-CM | POA: Insufficient documentation

## 2020-09-28 DIAGNOSIS — I10 Essential (primary) hypertension: Secondary | ICD-10-CM | POA: Diagnosis not present

## 2020-09-28 DIAGNOSIS — I2510 Atherosclerotic heart disease of native coronary artery without angina pectoris: Secondary | ICD-10-CM | POA: Diagnosis not present

## 2020-09-28 DIAGNOSIS — M25552 Pain in left hip: Secondary | ICD-10-CM | POA: Insufficient documentation

## 2020-09-28 DIAGNOSIS — Z79899 Other long term (current) drug therapy: Secondary | ICD-10-CM | POA: Insufficient documentation

## 2020-09-28 DIAGNOSIS — Z950 Presence of cardiac pacemaker: Secondary | ICD-10-CM | POA: Insufficient documentation

## 2020-09-28 DIAGNOSIS — I130 Hypertensive heart and chronic kidney disease with heart failure and stage 1 through stage 4 chronic kidney disease, or unspecified chronic kidney disease: Secondary | ICD-10-CM | POA: Insufficient documentation

## 2020-09-28 DIAGNOSIS — M549 Dorsalgia, unspecified: Secondary | ICD-10-CM | POA: Diagnosis not present

## 2020-09-28 LAB — BASIC METABOLIC PANEL
Anion gap: 6 (ref 5–15)
BUN: 13 mg/dL (ref 8–23)
CO2: 23 mmol/L (ref 22–32)
Calcium: 10.3 mg/dL (ref 8.9–10.3)
Chloride: 110 mmol/L (ref 98–111)
Creatinine, Ser: 1.15 mg/dL — ABNORMAL HIGH (ref 0.44–1.00)
GFR, Estimated: 44 mL/min — ABNORMAL LOW (ref >60)
Glucose, Bld: 161 mg/dL — ABNORMAL HIGH (ref 70–99)
Potassium: 3.9 mmol/L (ref 3.5–5.1)
Sodium: 139 mmol/L (ref 135–145)

## 2020-09-28 LAB — CBC
HCT: 39 % (ref 36.0–46.0)
Hemoglobin: 13.6 g/dL (ref 12.0–15.0)
MCH: 33.3 pg (ref 26.0–34.0)
MCHC: 34.9 g/dL (ref 30.0–36.0)
MCV: 95.4 fL (ref 80.0–100.0)
Platelets: 205 10*3/uL (ref 150–400)
RBC: 4.09 MIL/uL (ref 3.87–5.11)
RDW: 14.3 % (ref 11.5–15.5)
WBC: 7.9 10*3/uL (ref 4.0–10.5)
nRBC: 0 % (ref 0.0–0.2)

## 2020-09-28 LAB — URINALYSIS, ROUTINE W REFLEX MICROSCOPIC
Bilirubin Urine: NEGATIVE
Glucose, UA: NEGATIVE mg/dL
Hgb urine dipstick: NEGATIVE
Ketones, ur: NEGATIVE mg/dL
Leukocytes,Ua: NEGATIVE
Nitrite: NEGATIVE
Protein, ur: NEGATIVE mg/dL
Specific Gravity, Urine: 1.009 (ref 1.005–1.030)
pH: 5 (ref 5.0–8.0)

## 2020-09-28 MED ORDER — ACETAMINOPHEN 325 MG PO TABS
650.0000 mg | ORAL_TABLET | Freq: Once | ORAL | Status: AC
  Administered 2020-09-28: 650 mg via ORAL
  Filled 2020-09-28: qty 2

## 2020-09-28 MED ORDER — PREDNISONE 20 MG PO TABS: 40.0000 mg | ORAL_TABLET | Freq: Every day | ORAL | 0 refills | Status: AC

## 2020-09-28 MED ORDER — LIDOCAINE 5 % EX PTCH
1.0000 | MEDICATED_PATCH | CUTANEOUS | Status: DC
  Administered 2020-09-28: 1 via TRANSDERMAL
  Filled 2020-09-28: qty 1

## 2020-09-28 NOTE — ED Notes (Signed)
Patient BIB [facility] from home with left lower back pain x6 days with radiation down left leg. Patietn went to PCP yesterday and was given an injection (does not recall medication) and was also rx'd medication for a UTI. Patient reports no improvement in pain after injection yesterday.

## 2020-09-28 NOTE — ED Triage Notes (Signed)
Patient complains of sudden onset of left lower back pain that started last Friday. Denies recent injury. Patient also currently being treated for a urinary tract infection.

## 2020-09-28 NOTE — ED Provider Notes (Signed)
[facility] Provider Note   CSN: [id number] Arrival date & time: 09/28/20  1356     History Chief Complaint  Patient presents with   Back Pain    Stephanie Atkinson is a 87 y.o. female with a past medical history of CKD, CAD, HLD, hypertension is presenting for left hip pain.  She states that this morning she woke up with pain in her left hip.  She was given a cortisone injection yesterday by her family doctor yesterday. Patient complains of pain radiating down her left leg. She has been experiencing back pain for the last 6 days with pain radiating down her leg. She denies any urinary incontinence, saddle anesthesia, bowel incontinence.  Patient denies any midline bony back pain.  She denies any falls or trauma to her back.  She denies any history of osteoporosis.  Patient was recently diagnosed with a UTI.  Patient denies any dysuria, hematuria, changes in urinary frequency. She denies any numbness.   Patient denies any fever, chills, headache, neck stiffness, chest pain, shortness of breath, nausea, vomiting, diarrhea, numbness or weakness.    Back Pain Associated symptoms: no abdominal pain, no chest pain, no dysuria, no fever, no headaches, no numbness and no pelvic pain   Hip Pain This is a new problem. The current episode started more than 1 week ago. Pertinent negatives include no chest pain, no abdominal pain, no headaches and no shortness of breath. The symptoms are aggravated by walking. The symptoms are relieved by relaxation. She has tried acetaminophen for the symptoms. The treatment provided no relief.      Past Medical History:  Diagnosis Date   Chronic kidney disease, stage 3 (HCC)    Coronary artery disease    Hx of CABG 2006   LIMA-LAD, free RIMA-PDA, Radial-OM1-OM2   Hyperlipidemia    Hypertension    Hypothyroid    MI, old        PAF (paroxysmal atrial fibrillation) (HCC)    was on amio, d/c'd due to prolonged PR interval,  rate control   Presence of permanent cardiac pacemaker 07/16/2017   Presence of stent in right coronary artery 07/12   RIMA-PDA occluded, DES stent placed   Syncope 07/12   bradycardic, beta blocker decreased, also felt to be dehydrated    Patient Active Problem List   Diagnosis Date Noted   Venous stasis dermatitis of both lower extremities 01/06/2020   Unilateral primary osteoarthritis, right knee 01/06/2020   Community acquired bacterial pneumonia 06/23/2019   Acute kidney injury superimposed on chronic kidney disease (HCC) 06/23/2019   Synovitis of right knee 03/02/2019   Chronic diastolic heart failure (HCC) 09/14/2018   Pacemaker 07/16/2017   Atrial fibrillation with slow ventricular response (HCC) 06/27/2017   Hypercholesterolemia 06/27/2017   Symptomatic bradycardia 06/27/2017   Chronic pain of right knee 05/17/2016   De Quervain's tenosynovitis 05/17/2016   CAD (coronary artery disease) of artery bypass graft 02/13/2015   Encounter for loop recorder check 02/01/2014   Accelerated junctional rhythm 05/04/2013   Dehydration 10/14/2012   Syncope and collapse 10/13/2012   History of first degree atrioventricular block 10/11/2012   Long term (current) use of anticoagulants 10/06/2012   AVB - beta blocker and Amiodarone stopped 07/10/2012   Hypertension    Hx of CABG X 4 3/06-     Dyslipidemia    RCA DES placed 7/12- patent 8/14    Permanent atrial fibrillation (HCC)    Chronic kidney disease, stage  3 [facility])     Past Surgical History:  Procedure Laterality Date   CARDIAC CATHETERIZATION  [phone]   dr. Onalee Hua harding, revealing a atretic bypass to her right with patent distal RCA stent   CAROTID STENT  2012   CORONARY ARTERY BYPASS GRAFT  2006   LIMA-LAD, free RIMA-PDA, Radial-OM1-OM2   DOPPLER ECHOCARDIOGRAPHY  [id number]   mild asymmetric left ventricular hypertrophy, left ventricular systolic function is low normal, ejection fraction = 50-55%, the LA is moderate dilated,  the RA is mildly dilated, no significant valvular disease   INSERT / REPLACE / REMOVE PACEMAKER  07/16/2017   JOINT REPLACEMENT     hip replacement   LEFT HEART CATHETERIZATION WITH CORONARY/GRAFT ANGIOGRAM N/A 10/12/2012   Procedure: LEFT HEART CATHETERIZATION WITH Isabel Caprice;  Surgeon: Marykay Lex, MD;  Location: [facility] C[facility] LAB;  Service: Cardiovascular;  Laterality: N/A;   LOOP RECORDER IMPLANT N/A 10/15/2012   Procedure: LOOP RECORDER IMPLANT;  Surgeon: Thurmon Fair, MD;  Location: [facility] CATH LAB;  Service: Cardiovascular;  Laterality: N/A;   LOOP RECORDER REMOVAL  07/16/2017   LOOP RECORDER REMOVAL N/A 07/16/2017   Procedure: LOOP RECORDER REMOVAL;  Surgeon: Thurmon Fair, MD;  Location: [facility] INVASIVE CV LAB;  Service: Cardiovascular;  Laterality: N/A;   NM MYOVIEW LTD  [id number]   post stress left ventricle is normal in size, post stress ejection fraction is 67% global left ventricular systolic function is normal, normal myocardial perfusion study, abnormal myocardial perfusion study, low risk scan   PACEMAKER IMPLANT N/A 07/16/2017   Procedure: PACEMAKER IMPLANT;  Surgeon: Thurmon Fair, MD;  Location: [facility] INVASIVE CV LAB;  Service: Cardiovascular;  Laterality: N/A;   THYROIDECTOMY       OB History   No obstetric history on file.     Family History  Problem Relation Age of Onset   CAD Mother    CAD Maternal Grandmother     Social History   Tobacco Use   Smoking status: Never   Smokeless tobacco: Current    Types: Snuff  Vaping Use   Vaping Use: Never used  Substance Use Topics   Alcohol use: No   Drug use: No    Home Medications Prior to Admission medications   Medication Sig Start Date End Date Taking? Authorizing Provider  predniSONE (DELTASONE) 20 MG tablet Take 2 tablets (40 mg total) by mouth daily for 3 days. 09/28/20 10/01/20 Yes Lottie Funke, MD  acetaminophen (TYLENOL) 500 MG tablet Take 1 tablet (500 mg total) by mouth every 6 (six) hours as needed  for moderate pain. 06/27/19   Briant Cedar, MD  amLODipine (NORVASC) 10 MG tablet Take 1 tablet (10 mg total) by mouth daily. 06/22/20   Runell Gess, MD  atorvastatin (LIPITOR) 80 MG tablet Take 80 mg by mouth at bedtime.     [provider]  cholecalciferol (VITAMIN D3) 25 MCG (1000 UT) tablet Take 1,000 Units by mouth daily.    [provider]  cyclobenzaprine (FLEXERIL) 10 MG tablet 1/2 - 1 tablet 05/30/16   [provider]  diphenhydrAMINE (BENADRYL) 25 MG tablet Take 25 mg by mouth every 6 (six) hours as needed for allergies.    [provider]  ELIQUIS 5 MG TABS tablet TAKE 1 TABLET BY MOUTH TWICE A DAY 08/16/20   Runell Gess, MD  furosemide (LASIX) 20 MG tablet TAKE 1 TABLET BY MOUTH EVERY DAY 12/24/19   Runell Gess, MD  hydroxypropyl methylcellulose / hypromellose (ISOPTO  TEARS / GONIOVISC) 2.5 % ophthalmic solution Place 1 drop into both eyes 3 (three) times daily as needed for dry eyes.    [provider]  hydrOXYzine (ATARAX/VISTARIL) 25 MG tablet Take 25 mg by mouth 2 (two) times daily as needed.    [provider]  levothyroxine (SYNTHROID, LEVOTHROID) 50 MCG tablet Take 50 mcg by mouth daily before breakfast.     [provider]  nebivolol (BYSTOLIC) 5 MG tablet Take 5 mg by mouth daily.    [provider]  nitroGLYCERIN (NITROSTAT) 0.4 MG SL tablet Place 1 tablet (0.4 mg total) under the tongue every 5 (five) minutes as needed for chest pain. 04/26/20   Abelino Derrick, PA-C  spironolactone (ALDACTONE) 25 MG tablet TAKE 1/2 TABLET DAILY. PLEASE NOTE DOSAGE CHANGE. Patient taking differently: Take 25 mg by mouth daily. 12/24/19   Runell Gess, MD  traMADol (ULTRAM) 50 MG tablet Take 50 mg by mouth every 6 (six) hours as needed (for pain).     [provider]  valsartan (DIOVAN) 160 MG tablet Take 160 mg by mouth daily. 03/06/18   [provider]    Allergies    Penicillins  and Sulfa antibiotics  Review of Systems   Review of Systems  Constitutional:  Negative for chills, diaphoresis, fatigue and fever.  HENT:  Negative for congestion, ear pain, sneezing, sore throat and voice change.   Eyes:  Negative for pain and visual disturbance.  Respiratory:  Negative for cough, shortness of breath and wheezing.   Cardiovascular:  Negative for chest pain and palpitations.  Gastrointestinal:  Negative for abdominal distention, abdominal pain, constipation, diarrhea, nausea and vomiting.  Endocrine: Negative for polydipsia and polyuria.  Genitourinary:  Negative for decreased urine volume, difficulty urinating, dysuria, hematuria, pelvic pain, urgency, vaginal bleeding, vaginal discharge and vaginal pain.  Musculoskeletal:  Positive for back pain. Negative for arthralgias, joint swelling, myalgias, neck pain and neck stiffness.  Skin:  Negative for color change and rash.  Neurological:  Negative for dizziness, tremors, seizures, syncope, facial asymmetry, speech difficulty, light-headedness, numbness and headaches.  Hematological:  Negative for adenopathy. Does not bruise/bleed easily.  Psychiatric/Behavioral:  Negative for behavioral problems and confusion.   All other systems reviewed and are negative.  Physical Exam Updated Vital Signs BP 129/62 (BP Location: Right Arm)   Pulse 76   Temp 98.3 F (36.8 C) (Oral)   Resp 16   SpO2 95%   Physical Exam Vitals and nursing note reviewed.  Constitutional:      General: She is not in acute distress.    Appearance: Normal appearance. She is well-developed.  HENT:     Head: Normocephalic and atraumatic.     Right Ear: External ear normal.     Left Ear: External ear normal.     Nose: Nose normal.     Mouth/Throat:     Mouth: Mucous membranes are moist.     Pharynx: No oropharyngeal exudate.  Eyes:     General: No visual field deficit.    Extraocular Movements: Extraocular movements intact.     Conjunctiva/sclera:  Conjunctivae normal.     Pupils: Pupils are equal, round, and reactive to light.  Cardiovascular:     Rate and Rhythm: Normal rate and regular rhythm.     Pulses: Normal pulses.     Heart sounds: Normal heart sounds. No murmur heard. Pulmonary:     Effort: Pulmonary effort is normal. No respiratory distress.     Breath  sounds: Normal breath sounds. No wheezing, rhonchi or rales.  Abdominal:     General: Abdomen is flat. Bowel sounds are normal. There is no distension.     Palpations: Abdomen is soft.     Tenderness: There is no abdominal tenderness. There is no right CVA tenderness, left CVA tenderness, guarding or rebound.  Musculoskeletal:        General: No swelling.     Cervical back: Normal, normal range of motion and neck supple. No tenderness or bony tenderness.     Thoracic back: Normal. No tenderness or bony tenderness.     Lumbar back: Normal. No tenderness or bony tenderness.     Right hip: Normal.     Left hip: Tenderness present. No deformity, lacerations, bony tenderness or crepitus. Decreased range of motion. Normal strength.     Right upper leg: Normal.     Left upper leg: Normal.     Right lower leg: No edema.     Left lower leg: No edema.  Skin:    General: Skin is warm and dry.  Neurological:     General: No focal deficit present.     Mental Status: She is alert and oriented to person, place, and time. Mental status is at baseline.     Cranial Nerves: Cranial nerves are intact. No cranial nerve deficit, dysarthria or facial asymmetry.     Sensory: Sensation is intact. No sensory deficit.     Motor: Motor function is intact. No weakness.     Gait: Gait normal.  Psychiatric:        Mood and Affect: Mood normal.        Behavior: Behavior normal.        Thought Content: Thought content normal.        Judgment: Judgment normal.    ED Results / Procedures / Treatments   Labs (all labs ordered are listed, but only abnormal results are displayed) Labs Reviewed   URINE CULTURE - Abnormal; Notable for the following components:      Result Value   Culture   (*)    Value: >=100,000 COLONIES/mL GRAM NEGATIVE RODS SUSCEPTIBILITIES TO FOLLOW Performed at [facility], [address], [place], [place] [address]    All other components within normal limits  BASIC METABOLIC PANEL - Abnormal; Notable for the following components:   Glucose, Bld 161 (*)    Creatinine, Ser 1.15 (*)    GFR, Estimated 44 (*)    All other components within normal limits  URINALYSIS, ROUTINE W REFLEX MICROSCOPIC  CBC    EKG None  Radiology DG Lumbar Spine 2-3 Views  Result Date: 09/28/2020 CLINICAL DATA:  Left lower back and left hip pain since last week EXAM: LUMBAR SPINE - 2-3 VIEW COMPARISON:  10/02/2016 FINDINGS: Frontal and lateral views of the lumbar spine are obtained. Lateral views are suboptimal due to positioning. There are 5 non-rib-bearing lumbar type vertebral bodies with continued right convex scoliosis centered at L3. No acute displaced fractures. There is extensive facet hypertrophic change throughout the lumbar spine. Evaluation of the disc spaces is limited due to positioning, though there appears to be stable mild diffuse spondylosis since prior study. IMPRESSION: 1. Limited study due to positioning. Extensive multilevel facet hypertrophic changes and mild spondylosis grossly unchanged since prior exam. Electronically Signed   By: Sharlet Salina M.D.   On: 09/28/2020 22:07   DG Hip Unilat W or Wo Pelvis 2-3 Views Left  Result Date: 09/28/2020 CLINICAL  DATA:  Sudden onset left lower back pain, left hip pain EXAM: DG HIP (WITH OR WITHOUT PELVIS) 2-3V LEFT COMPARISON:  01/08/2017 FINDINGS: Frontal view of the pelvis as well as frontal and frogleg lateral views of the left hip are obtained. Partial visualization of a right hip arthroplasty, in stable position. There is severe left hip osteoarthritis unchanged since prior exam, with marked joint space narrowing  and marginal osteophyte formation. No acute fracture, subluxation, or dislocation. Bony pelvis is unremarkable. Sacroiliac joints are normal. IMPRESSION: 1. Stable severe left hip osteoarthritis.  No acute fracture. 2. Unremarkable right hip arthroplasty. Electronically Signed   By: Sharlet Salina M.D.   On: 09/28/2020 22:05    Procedures Procedures   Medications Ordered in ED Medications  acetaminophen (TYLENOL) tablet 650 mg (650 mg Oral Given 09/28/20 2300)    ED Course  I have reviewed the triage vital signs and the nursing notes.  Pertinent labs & imaging results that were available during my care of the patient were reviewed by me and considered in my medical decision making (see chart for details).    MDM Rules/Calculators/A&P                         Patient is a 87 y.o. female with a past medical history of CKD, CAD, HLD, hypertension is presenting for left hip pain. Patient is hemodynamically stable and in no acute distress. She has a had a week of lower back pain. She denies any trauma or falls. She received a coritsone injection by her PCP yesterday. She has no signs of infection or cellulitis at the injection site. She is neurovascularly intact. Straight leg raise was positive. Her symptoms are most consistent with lumbar radiculopathy. Her labs are unremarkable. She is currently being treated for a UTI. Lumbar x-ray was unchanged from prior x-ray. Left hip x-ray showed no left hip fracture. Patient instructed to take 3 days of prednisone 40 mg.  Please take Tylenol 650 4 times daily for 5 days.  Please use lidocaine patches as needed.  Please follow-up with PCP on Wednesday for further evaluation. Final Clinical Impression(s) / ED Diagnoses Final diagnoses:  Low back pain, unspecified back pain laterality, unspecified chronicity, unspecified whether sciatica present  Left hip pain    Rx / DC Orders ED Discharge Orders          Ordered    predniSONE (DELTASONE) 20 MG tablet   Daily        09/28/20 2248             Lottie Bayon, MD 09/29/20 1220    Derwood Kaplan, MD 09/29/20 1238

## 2020-09-28 NOTE — Discharge Instructions (Signed)
Please take prednisone 40 mg daily for the next 3 days.  Please take Tylenol 650 mg 4 times a day for the next 5 days.  Please use lidocaine patches to your left hip.  Please follow-up with your primary care provider on Wednesday.

## 2020-09-30 LAB — URINE CULTURE: Culture: >=100000 — AB

## 2020-10-01 ENCOUNTER — Telehealth: Payer: Self-pay | Admitting: Emergency Medicine

## 2020-10-01 NOTE — Telephone Encounter (Signed)
Post ED Visit - Positive Culture Follow-up: Unsuccessful Patient Follow-up  Culture assessed and recommendations reviewed by:  []  , Pharm.D. []  Enzo Bi, Pharm.D., BCPS AQ-ID []  , Pharm.D., BCPS []  Celedonio Miyamoto, .D., BCPS []  Ethel, .D., BCPS, AAHIVP []  Georgina Pillion, Pharm.D., BCPS, AAHIVP [x]  [address], PharmD []  , PharmD, BCPS  Positive urine culture  []  Patient discharged without antimicrobial prescription and treatment is now indicated []  Organism is resistant to prescribed ED discharge antimicrobial []  Patient with positive blood cultures   Unable to contact patient by phone, letter will be sent to address on file  Plan: Call patient assess for symptoms, follow-up if having UTI symptoms with PCP.   [place] Tekela Garguilo 10/01/2020, 5:52 PM

## 2020-10-01 NOTE — Progress Notes (Addendum)
ED Antimicrobial Stewardship Positive Culture Follow Up   Stephanie Atkinson is an 87 y.o. female who presented to [facility] on 09/28/2020 with a chief complaint of  Chief Complaint  Patient presents with   Back Pain    Recent Results (from the past 720 hour(s))  Urine Culture     Status: Abnormal   Collection Time: 09/28/20  2:47 PM   Specimen: Urine, Clean Catch  Result Value Ref Range Status   Specimen Description URINE, CLEAN CATCH  Final   Special Requests   Final    NONE Performed at [facility], [address] [place], [place] [address]    Culture (A)  Final    >=100,000 COLONIES/mL ACINETOBACTER CALCOACETICUS/BAUMANNII COMPLEX   Report Status 09/30/2020 FINAL  Final   Organism ID, Bacteria ACINETOBACTER CALCOACETICUS/BAUMANNII COMPLEX (A)  Final      Susceptibility   Acinetobacter calcoaceticus/baumannii complex - MIC*    CEFTAZIDIME 4 SENSITIVE Sensitive     CIPROFLOXACIN <=0.25 SENSITIVE Sensitive     GENTAMICIN <=1 SENSITIVE Sensitive     IMIPENEM <=0.25 SENSITIVE Sensitive     PIP/TAZO 8 SENSITIVE Sensitive     TRIMETH/SULFA <=20 SENSITIVE Sensitive     AMPICILLIN/SULBACTAM <=2 SENSITIVE Sensitive     * >=100,000 COLONIES/mL ACINETOBACTER CALCOACETICUS/BAUMANNII COMPLEX    []  Treated with N/A, organism resistant to prescribed antimicrobial [x]  Patient discharged originally without antimicrobial agent and treatment is now indicated  New antibiotic prescription: Call patient and assess for UTI symptom check for follow-up. If patient reporting any sx, then have patient f/u with PCP.   ED Provider: , DO  , PharmD, [facility] Emergency Medicine Clinical Pharmacist ED RPh Phone: [phone] Main RX: [phone]

## 2020-10-03 ENCOUNTER — Ambulatory Visit: Payer: Medicare PPO | Admitting: Orthopaedic Surgery

## 2020-10-05 ENCOUNTER — Ambulatory Visit: Payer: Medicare PPO | Admitting: Surgery

## 2020-10-06 DIAGNOSIS — I4891 Unspecified atrial fibrillation: Secondary | ICD-10-CM | POA: Diagnosis not present

## 2020-10-06 DIAGNOSIS — I509 Heart failure, unspecified: Secondary | ICD-10-CM | POA: Diagnosis not present

## 2020-10-06 DIAGNOSIS — I130 Hypertensive heart and chronic kidney disease with heart failure and stage 1 through stage 4 chronic kidney disease, or unspecified chronic kidney disease: Secondary | ICD-10-CM | POA: Diagnosis not present

## 2020-10-06 DIAGNOSIS — I700 Atherosclerosis of aorta: Secondary | ICD-10-CM | POA: Diagnosis not present

## 2020-10-06 DIAGNOSIS — I25119 Atherosclerotic heart disease of native coronary artery with unspecified angina pectoris: Secondary | ICD-10-CM | POA: Diagnosis not present

## 2020-10-06 DIAGNOSIS — Z950 Presence of cardiac pacemaker: Secondary | ICD-10-CM | POA: Diagnosis not present

## 2020-10-06 DIAGNOSIS — E663 Overweight: Secondary | ICD-10-CM | POA: Diagnosis not present

## 2020-10-06 DIAGNOSIS — E785 Hyperlipidemia, unspecified: Secondary | ICD-10-CM | POA: Diagnosis not present

## 2020-10-06 DIAGNOSIS — E890 Postprocedural hypothyroidism: Secondary | ICD-10-CM | POA: Diagnosis not present

## 2020-10-06 DIAGNOSIS — Z96649 Presence of unspecified artificial hip joint: Secondary | ICD-10-CM | POA: Diagnosis not present

## 2020-10-06 DIAGNOSIS — G8929 Other chronic pain: Secondary | ICD-10-CM | POA: Diagnosis not present

## 2020-10-09 DIAGNOSIS — M25552 Pain in left hip: Secondary | ICD-10-CM | POA: Diagnosis not present

## 2020-10-09 DIAGNOSIS — Z09 Encounter for follow-up examination after completed treatment for conditions other than malignant neoplasm: Secondary | ICD-10-CM | POA: Diagnosis not present

## 2020-10-09 DIAGNOSIS — M791 Myalgia, unspecified site: Secondary | ICD-10-CM | POA: Diagnosis not present

## 2020-10-09 DIAGNOSIS — M545 Low back pain, unspecified: Secondary | ICD-10-CM | POA: Diagnosis not present

## 2020-10-09 DIAGNOSIS — G8929 Other chronic pain: Secondary | ICD-10-CM | POA: Diagnosis not present

## 2020-10-11 DIAGNOSIS — N189 Chronic kidney disease, unspecified: Secondary | ICD-10-CM | POA: Diagnosis not present

## 2020-10-11 DIAGNOSIS — I2510 Atherosclerotic heart disease of native coronary artery without angina pectoris: Secondary | ICD-10-CM | POA: Diagnosis not present

## 2020-10-11 DIAGNOSIS — M545 Low back pain, unspecified: Secondary | ICD-10-CM | POA: Diagnosis not present

## 2020-10-11 DIAGNOSIS — I130 Hypertensive heart and chronic kidney disease with heart failure and stage 1 through stage 4 chronic kidney disease, or unspecified chronic kidney disease: Secondary | ICD-10-CM | POA: Diagnosis not present

## 2020-10-11 DIAGNOSIS — M1990 Unspecified osteoarthritis, unspecified site: Secondary | ICD-10-CM | POA: Diagnosis not present

## 2020-10-11 DIAGNOSIS — E039 Hypothyroidism, unspecified: Secondary | ICD-10-CM | POA: Diagnosis not present

## 2020-10-11 DIAGNOSIS — I4891 Unspecified atrial fibrillation: Secondary | ICD-10-CM | POA: Diagnosis not present

## 2020-10-11 DIAGNOSIS — G8929 Other chronic pain: Secondary | ICD-10-CM | POA: Diagnosis not present

## 2020-10-11 DIAGNOSIS — I5032 Chronic diastolic (congestive) heart failure: Secondary | ICD-10-CM | POA: Diagnosis not present

## 2020-10-13 ENCOUNTER — Ambulatory Visit (INDEPENDENT_AMBULATORY_CARE_PROVIDER_SITE_OTHER): Payer: Medicare PPO

## 2020-10-13 DIAGNOSIS — I4821 Permanent atrial fibrillation: Secondary | ICD-10-CM | POA: Diagnosis not present

## 2020-10-13 LAB — CUP PACEART REMOTE DEVICE CHECK
Battery Remaining Longevity: 105 mo
Battery Voltage: 3 V
Brady Statistic RV Percent Paced: 98.9 %
Date Time Interrogation Session: 20220819004247
Implantable Lead Implant Date: 20190522
Implantable Lead Location: 753860
Implantable Lead Model: 5076
Implantable Pulse Generator Implant Date: 20190522
Lead Channel Impedance Value: 361 Ohm
Lead Channel Impedance Value: 437 Ohm
Lead Channel Pacing Threshold Amplitude: 0.625 V
Lead Channel Pacing Threshold Pulse Width: 0.4 ms
Lead Channel Sensing Intrinsic Amplitude: 6.625 mV
Lead Channel Sensing Intrinsic Amplitude: 6.625 mV
Lead Channel Setting Pacing Amplitude: 2.5 V
Lead Channel Setting Pacing Pulse Width: 0.4 ms
Lead Channel Setting Sensing Sensitivity: 1.2 mV

## 2020-10-23 DIAGNOSIS — I4891 Unspecified atrial fibrillation: Secondary | ICD-10-CM | POA: Diagnosis not present

## 2020-10-23 DIAGNOSIS — N189 Chronic kidney disease, unspecified: Secondary | ICD-10-CM | POA: Diagnosis not present

## 2020-10-23 DIAGNOSIS — M1990 Unspecified osteoarthritis, unspecified site: Secondary | ICD-10-CM | POA: Diagnosis not present

## 2020-10-23 DIAGNOSIS — I5032 Chronic diastolic (congestive) heart failure: Secondary | ICD-10-CM | POA: Diagnosis not present

## 2020-10-23 DIAGNOSIS — M545 Low back pain, unspecified: Secondary | ICD-10-CM | POA: Diagnosis not present

## 2020-10-23 DIAGNOSIS — I2510 Atherosclerotic heart disease of native coronary artery without angina pectoris: Secondary | ICD-10-CM | POA: Diagnosis not present

## 2020-10-23 DIAGNOSIS — I130 Hypertensive heart and chronic kidney disease with heart failure and stage 1 through stage 4 chronic kidney disease, or unspecified chronic kidney disease: Secondary | ICD-10-CM | POA: Diagnosis not present

## 2020-10-23 DIAGNOSIS — G8929 Other chronic pain: Secondary | ICD-10-CM | POA: Diagnosis not present

## 2020-10-23 DIAGNOSIS — E039 Hypothyroidism, unspecified: Secondary | ICD-10-CM | POA: Diagnosis not present

## 2020-10-24 ENCOUNTER — Ambulatory Visit: Payer: Medicare PPO | Admitting: Cardiovascular Disease

## 2020-10-28 NOTE — Progress Notes (Signed)
Remote pacemaker transmission.   

## 2020-11-02 DIAGNOSIS — I4891 Unspecified atrial fibrillation: Secondary | ICD-10-CM | POA: Diagnosis not present

## 2020-11-02 DIAGNOSIS — M545 Low back pain, unspecified: Secondary | ICD-10-CM | POA: Diagnosis not present

## 2020-11-02 DIAGNOSIS — M1990 Unspecified osteoarthritis, unspecified site: Secondary | ICD-10-CM | POA: Diagnosis not present

## 2020-11-02 DIAGNOSIS — G8929 Other chronic pain: Secondary | ICD-10-CM | POA: Diagnosis not present

## 2020-11-02 DIAGNOSIS — I130 Hypertensive heart and chronic kidney disease with heart failure and stage 1 through stage 4 chronic kidney disease, or unspecified chronic kidney disease: Secondary | ICD-10-CM | POA: Diagnosis not present

## 2020-11-02 DIAGNOSIS — N189 Chronic kidney disease, unspecified: Secondary | ICD-10-CM | POA: Diagnosis not present

## 2020-11-02 DIAGNOSIS — I5032 Chronic diastolic (congestive) heart failure: Secondary | ICD-10-CM | POA: Diagnosis not present

## 2020-11-02 DIAGNOSIS — E039 Hypothyroidism, unspecified: Secondary | ICD-10-CM | POA: Diagnosis not present

## 2020-11-02 DIAGNOSIS — I2510 Atherosclerotic heart disease of native coronary artery without angina pectoris: Secondary | ICD-10-CM | POA: Diagnosis not present

## 2020-11-06 ENCOUNTER — Ambulatory Visit (HOSPITAL_COMMUNITY)
Admission: EM | Admit: 2020-11-06 | Discharge: 2020-11-06 | Disposition: A | Discharge status: Home / Self Care | Payer: Medicare PPO | Attending: Family Medicine | Admitting: Family Medicine

## 2020-11-06 ENCOUNTER — Encounter (HOSPITAL_COMMUNITY): Payer: Self-pay

## 2020-11-06 ENCOUNTER — Other Ambulatory Visit: Payer: Self-pay

## 2020-11-06 ENCOUNTER — Ambulatory Visit (INDEPENDENT_AMBULATORY_CARE_PROVIDER_SITE_OTHER): Payer: Medicare PPO

## 2020-11-06 DIAGNOSIS — J9811 Atelectasis: Secondary | ICD-10-CM | POA: Diagnosis not present

## 2020-11-06 DIAGNOSIS — R059 Cough, unspecified: Secondary | ICD-10-CM

## 2020-11-06 MED ORDER — BENZONATATE 100 MG PO CAPS: ORAL_CAPSULE | ORAL | 0 refills | Status: DC

## 2020-11-06 NOTE — ED Triage Notes (Signed)
Pt presents with productive X 2 weeks that causes her to have flank pain.

## 2020-11-06 NOTE — ED Provider Notes (Signed)
[facility]   [id number] 11/06/20 Arrival Time: 0830  ASSESSMENT & PLAN:  1. Cough    I have personally viewed the imaging studies ordered this visit. No signs of infection/PNA.  Likely viral trigger with persistent coughing; discussed.  Meds ordered this encounter  Medications   benzonatate (TESSALON) 100 MG capsule    Sig: Take 1 capsule by mouth every 8 (eight) hours for cough.    Dispense:  21 capsule    Refill:  0      Follow-up Information     Pearson Grippe, MD.   Specialty: Internal Medicine Why: As needed. Contact information: [address] [place] [address] [phone]                Reviewed expectations re: course of current medical issues. Questions answered. Outlined signs and symptoms indicating need for more acute intervention. Understanding verbalized. After Visit Summary given.   SUBJECTIVE: History from: patient. Stephanie Atkinson is a 87 y.o. female who reports a cold two w ago; feeling better; now with hacking cough. No SOB. Mild chest soreness on sides secondary to coughing. Sleeping well. Normal PO intake without n/v/d. Ambulatory.  OBJECTIVE:  Vitals:   11/06/20 0935  BP: (!) 154/87  Pulse: 80  Resp: 16  Temp: 98.3 F (36.8 C)  TempSrc: Oral  SpO2: 92%    General appearance: alert; no distress Eyes: PERRLA; EOMI; conjunctiva normal HENT: [place]; AT; without nasal congestion Neck: supple  Lungs: speaks full sentences without difficulty; unlabored; CTAB; dry cough Extremities: no edema Skin: warm and dry Neurologic: normal gait Psychological: alert and cooperative; normal mood and affect  Imaging: DG Chest 2 View  Result Date: 11/06/2020 CLINICAL DATA:  Cough x2 weeks. EXAM: CHEST - 2 VIEW COMPARISON:  March 29, 2020 FINDINGS: Left chest pacemaker with lead overlying the right ventricle. Prior median sternotomy and CABG. Aortic atherosclerosis. Similar cardiac enlargement. Linear opacities in the  bilateral lung bases, favor atelectasis. Chronic bilateral pleural thickening. Severe left shoulder degenerative change. Right upper quadrant surgical clips. IMPRESSION: 1. Bibasilar atelectasis. 2. Stable cardiac enlargement without overt pulmonary edema. 3.  Aortic Atherosclerosis (ICD10-I70.0). Electronically Signed   By: Maudry Mayhew M.D.   On: 11/06/2020 10:22   CUP PACEART REMOTE DEVICE CHECK  Result Date: 10/13/2020 Scheduled remote reviewed. Normal device function.  Next remote 91 days. LR    Allergies  Allergen Reactions   Penicillins Hives    Did it involve swelling of the face/tongue/throat, SOB, or low BP? No Did it involve sudden or severe rash/hives, skin peeling, or any reaction on the inside of your mouth or nose? No Did you need to seek medical attention at a hospital or doctor's office? No When did it last happen? Unk    If all above answers are "NO", may proceed with cephalosporin use.    Sulfa Antibiotics Hives and Swelling    Past Medical History:  Diagnosis Date   Chronic kidney disease, stage 3 (HCC)    Coronary artery disease    Hx of CABG 2006   LIMA-LAD, free RIMA-PDA, Radial-OM1-OM2   Hyperlipidemia    Hypertension    Hypothyroid    MI, old        PAF (paroxysmal atrial fibrillation) (HCC)    was on amio, d/c'd due to prolonged PR interval, rate control   Presence of permanent cardiac pacemaker 07/16/2017   Presence of stent in right coronary artery 07/12   RIMA-PDA occluded, DES stent placed  Syncope 07/12   bradycardic, beta blocker decreased, also felt to be dehydrated   Social History   Socioeconomic History   Marital status: Single    Spouse name: Not on file   Number of children: Not on file   Years of education: Not on file   Highest education level: Not on file  Occupational History   Not on file  Tobacco Use   Smoking status: Never   Smokeless tobacco: Current    Types: Snuff  Vaping Use   Vaping Use: Never used  Substance  and Sexual Activity   Alcohol use: No   Drug use: No   Sexual activity: Not on file  Other Topics Concern   Not on file  Social History Narrative   Not on file   Social Determinants of Health   Financial Resource Strain: Not on file  Food Insecurity: Not on file  Transportation Needs: Not on file  Physical Activity: Not on file  Stress: Not on file  Social Connections: Not on file  Intimate Partner Violence: Not on file   Family History  Problem Relation Age of Onset   CAD Mother    CAD Maternal Grandmother    Past Surgical History:  Procedure Laterality Date   CARDIAC CATHETERIZATION  [phone]   dr. Onalee Hua harding, revealing a atretic bypass to her right with patent distal RCA stent   CAROTID STENT  2012   CORONARY ARTERY BYPASS GRAFT  2006   LIMA-LAD, free RIMA-PDA, Radial-OM1-OM2   DOPPLER ECHOCARDIOGRAPHY  [id number]   mild asymmetric left ventricular hypertrophy, left ventricular systolic function is low normal, ejection fraction = 50-55%, the LA is moderate dilated, the RA is mildly dilated, no significant valvular disease   INSERT / REPLACE / REMOVE PACEMAKER  07/16/2017   JOINT REPLACEMENT     hip replacement   LEFT HEART CATHETERIZATION WITH CORONARY/GRAFT ANGIOGRAM N/A 10/12/2012   Procedure: LEFT HEART CATHETERIZATION WITH Isabel Caprice;  Surgeon: Marykay Lex, MD;  Location: [facility] CATH LAB;  Service: Cardiovascular;  Laterality: N/A;   LOOP RECORDER IMPLANT N/A 10/15/2012   Procedure: LOOP RECORDER IMPLANT;  Surgeon: Thurmon Fair, MD;  Location: [facility] CATH LAB;  Service: Cardiovascular;  Laterality: N/A;   LOOP RECORDER REMOVAL  07/16/2017   LOOP RECORDER REMOVAL N/A 07/16/2017   Procedure: LOOP RECORDER REMOVAL;  Surgeon: Thurmon Fair, MD;  Location: [facility] INVASIVE CV LAB;  Service: Cardiovascular;  Laterality: N/A;   NM MYOVIEW LTD  [id number]   post stress left ventricle is normal in size, post stress ejection fraction is 67% global left ventricular systolic  function is normal, normal myocardial perfusion study, abnormal myocardial perfusion study, low risk scan   PACEMAKER IMPLANT N/A 07/16/2017   Procedure: PACEMAKER IMPLANT;  Surgeon: Thurmon Fair, MD;  Location: [facility] INVASIVE CV LAB;  Service: Cardiovascular;  Laterality: N/A;   Forestine Chute, MD 11/06/20 [phone]

## 2020-11-09 DIAGNOSIS — N189 Chronic kidney disease, unspecified: Secondary | ICD-10-CM | POA: Diagnosis not present

## 2020-11-09 DIAGNOSIS — G8929 Other chronic pain: Secondary | ICD-10-CM | POA: Diagnosis not present

## 2020-11-09 DIAGNOSIS — M545 Low back pain, unspecified: Secondary | ICD-10-CM | POA: Diagnosis not present

## 2020-11-09 DIAGNOSIS — I130 Hypertensive heart and chronic kidney disease with heart failure and stage 1 through stage 4 chronic kidney disease, or unspecified chronic kidney disease: Secondary | ICD-10-CM | POA: Diagnosis not present

## 2020-11-17 DIAGNOSIS — I130 Hypertensive heart and chronic kidney disease with heart failure and stage 1 through stage 4 chronic kidney disease, or unspecified chronic kidney disease: Secondary | ICD-10-CM | POA: Diagnosis not present

## 2020-11-17 DIAGNOSIS — I2510 Atherosclerotic heart disease of native coronary artery without angina pectoris: Secondary | ICD-10-CM | POA: Diagnosis not present

## 2020-11-17 DIAGNOSIS — E039 Hypothyroidism, unspecified: Secondary | ICD-10-CM | POA: Diagnosis not present

## 2020-11-17 DIAGNOSIS — N189 Chronic kidney disease, unspecified: Secondary | ICD-10-CM | POA: Diagnosis not present

## 2020-11-17 DIAGNOSIS — I4891 Unspecified atrial fibrillation: Secondary | ICD-10-CM | POA: Diagnosis not present

## 2020-11-17 DIAGNOSIS — M1990 Unspecified osteoarthritis, unspecified site: Secondary | ICD-10-CM | POA: Diagnosis not present

## 2020-11-17 DIAGNOSIS — I5032 Chronic diastolic (congestive) heart failure: Secondary | ICD-10-CM | POA: Diagnosis not present

## 2020-11-17 DIAGNOSIS — M545 Low back pain, unspecified: Secondary | ICD-10-CM | POA: Diagnosis not present

## 2020-11-17 DIAGNOSIS — G8929 Other chronic pain: Secondary | ICD-10-CM | POA: Diagnosis not present

## 2020-11-22 DIAGNOSIS — G8929 Other chronic pain: Secondary | ICD-10-CM | POA: Diagnosis not present

## 2020-11-22 DIAGNOSIS — M545 Low back pain, unspecified: Secondary | ICD-10-CM | POA: Diagnosis not present

## 2020-11-22 DIAGNOSIS — M1990 Unspecified osteoarthritis, unspecified site: Secondary | ICD-10-CM | POA: Diagnosis not present

## 2020-11-22 DIAGNOSIS — I130 Hypertensive heart and chronic kidney disease with heart failure and stage 1 through stage 4 chronic kidney disease, or unspecified chronic kidney disease: Secondary | ICD-10-CM | POA: Diagnosis not present

## 2020-11-22 DIAGNOSIS — I5032 Chronic diastolic (congestive) heart failure: Secondary | ICD-10-CM | POA: Diagnosis not present

## 2020-11-22 DIAGNOSIS — I2510 Atherosclerotic heart disease of native coronary artery without angina pectoris: Secondary | ICD-10-CM | POA: Diagnosis not present

## 2020-11-22 DIAGNOSIS — N189 Chronic kidney disease, unspecified: Secondary | ICD-10-CM | POA: Diagnosis not present

## 2020-11-22 DIAGNOSIS — E039 Hypothyroidism, unspecified: Secondary | ICD-10-CM | POA: Diagnosis not present

## 2020-11-22 DIAGNOSIS — I4891 Unspecified atrial fibrillation: Secondary | ICD-10-CM | POA: Diagnosis not present

## 2020-11-27 DIAGNOSIS — E063 Autoimmune thyroiditis: Secondary | ICD-10-CM | POA: Diagnosis not present

## 2020-11-27 DIAGNOSIS — N1832 Chronic kidney disease, stage 3b: Secondary | ICD-10-CM | POA: Diagnosis not present

## 2020-11-27 DIAGNOSIS — E785 Hyperlipidemia, unspecified: Secondary | ICD-10-CM | POA: Diagnosis not present

## 2020-11-30 ENCOUNTER — Emergency Department (HOSPITAL_COMMUNITY)
Admission: EM | Admit: 2020-11-30 | Discharge: 2020-12-01 | Disposition: A | Discharge status: Home / Self Care | Payer: Medicare PPO | Attending: Student | Admitting: Student

## 2020-11-30 ENCOUNTER — Emergency Department (HOSPITAL_COMMUNITY): Payer: Medicare PPO

## 2020-11-30 ENCOUNTER — Other Ambulatory Visit: Payer: Self-pay

## 2020-11-30 ENCOUNTER — Encounter (HOSPITAL_COMMUNITY): Payer: Self-pay | Admitting: *Deleted

## 2020-11-30 DIAGNOSIS — N390 Urinary tract infection, site not specified: Secondary | ICD-10-CM

## 2020-11-30 DIAGNOSIS — R109 Unspecified abdominal pain: Secondary | ICD-10-CM | POA: Diagnosis not present

## 2020-11-30 DIAGNOSIS — I130 Hypertensive heart and chronic kidney disease with heart failure and stage 1 through stage 4 chronic kidney disease, or unspecified chronic kidney disease: Secondary | ICD-10-CM | POA: Insufficient documentation

## 2020-11-30 DIAGNOSIS — R1030 Lower abdominal pain, unspecified: Secondary | ICD-10-CM | POA: Diagnosis not present

## 2020-11-30 DIAGNOSIS — Z20822 Contact with and (suspected) exposure to covid-19: Secondary | ICD-10-CM | POA: Diagnosis not present

## 2020-11-30 DIAGNOSIS — R0789 Other chest pain: Secondary | ICD-10-CM | POA: Diagnosis not present

## 2020-11-30 DIAGNOSIS — Z7901 Long term (current) use of anticoagulants: Secondary | ICD-10-CM | POA: Diagnosis not present

## 2020-11-30 DIAGNOSIS — R059 Cough, unspecified: Secondary | ICD-10-CM | POA: Insufficient documentation

## 2020-11-30 DIAGNOSIS — Z950 Presence of cardiac pacemaker: Secondary | ICD-10-CM | POA: Insufficient documentation

## 2020-11-30 DIAGNOSIS — I5032 Chronic diastolic (congestive) heart failure: Secondary | ICD-10-CM | POA: Insufficient documentation

## 2020-11-30 DIAGNOSIS — R6883 Chills (without fever): Secondary | ICD-10-CM | POA: Diagnosis not present

## 2020-11-30 DIAGNOSIS — R079 Chest pain, unspecified: Secondary | ICD-10-CM | POA: Insufficient documentation

## 2020-11-30 DIAGNOSIS — Z79899 Other long term (current) drug therapy: Secondary | ICD-10-CM | POA: Insufficient documentation

## 2020-11-30 DIAGNOSIS — N183 Chronic kidney disease, stage 3 unspecified: Secondary | ICD-10-CM | POA: Diagnosis not present

## 2020-11-30 DIAGNOSIS — R300 Dysuria: Secondary | ICD-10-CM | POA: Insufficient documentation

## 2020-11-30 DIAGNOSIS — Z96649 Presence of unspecified artificial hip joint: Secondary | ICD-10-CM | POA: Diagnosis not present

## 2020-11-30 DIAGNOSIS — I2510 Atherosclerotic heart disease of native coronary artery without angina pectoris: Secondary | ICD-10-CM | POA: Diagnosis not present

## 2020-11-30 DIAGNOSIS — Z951 Presence of aortocoronary bypass graft: Secondary | ICD-10-CM | POA: Insufficient documentation

## 2020-11-30 DIAGNOSIS — I4891 Unspecified atrial fibrillation: Secondary | ICD-10-CM | POA: Diagnosis not present

## 2020-11-30 DIAGNOSIS — J9811 Atelectasis: Secondary | ICD-10-CM | POA: Diagnosis not present

## 2020-11-30 DIAGNOSIS — E039 Hypothyroidism, unspecified: Secondary | ICD-10-CM | POA: Insufficient documentation

## 2020-11-30 DIAGNOSIS — I10 Essential (primary) hypertension: Secondary | ICD-10-CM | POA: Diagnosis not present

## 2020-11-30 DIAGNOSIS — F17220 Nicotine dependence, chewing tobacco, uncomplicated: Secondary | ICD-10-CM | POA: Diagnosis not present

## 2020-11-30 DIAGNOSIS — R0902 Hypoxemia: Secondary | ICD-10-CM | POA: Diagnosis not present

## 2020-11-30 DIAGNOSIS — I517 Cardiomegaly: Secondary | ICD-10-CM | POA: Diagnosis not present

## 2020-11-30 DIAGNOSIS — R001 Bradycardia, unspecified: Secondary | ICD-10-CM | POA: Diagnosis not present

## 2020-11-30 LAB — TROPONIN I (HIGH SENSITIVITY)
Troponin I (High Sensitivity): 19 ng/L — ABNORMAL HIGH (ref <18)
Troponin I (High Sensitivity): 22 ng/L — ABNORMAL HIGH (ref <18)

## 2020-11-30 LAB — RESP PANEL BY RT-PCR (FLU A&B, COVID) ARPGX2
Influenza A by PCR: NEGATIVE
Influenza B by PCR: NEGATIVE
SARS Coronavirus 2 by RT PCR: NEGATIVE

## 2020-11-30 LAB — COMPREHENSIVE METABOLIC PANEL
ALT: 20 U/L (ref 0–44)
AST: 21 U/L (ref 15–41)
Albumin: 3.6 g/dL (ref 3.5–5.0)
Alkaline Phosphatase: 71 U/L (ref 38–126)
Anion gap: 11 (ref 5–15)
BUN: 13 mg/dL (ref 8–23)
CO2: 23 mmol/L (ref 22–32)
Calcium: 10.6 mg/dL — ABNORMAL HIGH (ref 8.9–10.3)
Chloride: 104 mmol/L (ref 98–111)
Creatinine, Ser: 1.21 mg/dL — ABNORMAL HIGH (ref 0.44–1.00)
GFR, Estimated: 42 mL/min — ABNORMAL LOW (ref >60)
Glucose, Bld: 103 mg/dL — ABNORMAL HIGH (ref 70–99)
Potassium: 3.5 mmol/L (ref 3.5–5.1)
Sodium: 138 mmol/L (ref 135–145)
Total Bilirubin: 1.1 mg/dL (ref 0.3–1.2)
Total Protein: 6.6 g/dL (ref 6.5–8.1)

## 2020-11-30 LAB — CBC WITH DIFFERENTIAL/PLATELET
Abs Immature Granulocytes: 0.03 10*3/uL (ref 0.00–0.07)
Basophils Absolute: 0 10*3/uL (ref 0.0–0.1)
Basophils Relative: 0 %
Eosinophils Absolute: 0 10*3/uL (ref 0.0–0.5)
Eosinophils Relative: 0 %
HCT: 43.9 % (ref 36.0–46.0)
Hemoglobin: 13.9 g/dL (ref 12.0–15.0)
Immature Granulocytes: 0 %
Lymphocytes Relative: 3 %
Lymphs Abs: 0.4 10*3/uL — ABNORMAL LOW (ref 0.7–4.0)
MCH: 27.1 pg (ref 26.0–34.0)
MCHC: 31.7 g/dL (ref 30.0–36.0)
MCV: 85.7 fL (ref 80.0–100.0)
Monocytes Absolute: 0.5 10*3/uL (ref 0.1–1.0)
Monocytes Relative: 4 %
Neutro Abs: 10.4 10*3/uL — ABNORMAL HIGH (ref 1.7–7.7)
Neutrophils Relative %: 93 %
Platelets: 199 10*3/uL (ref 150–400)
RBC: 5.12 MIL/uL — ABNORMAL HIGH (ref 3.87–5.11)
RDW: 14.4 % (ref 11.5–15.5)
WBC: 11.4 10*3/uL — ABNORMAL HIGH (ref 4.0–10.5)
nRBC: 0 % (ref 0.0–0.2)

## 2020-11-30 LAB — LACTIC ACID, PLASMA: Lactic Acid, Venous: 1.9 mmol/L (ref 0.5–1.9)

## 2020-11-30 MED ORDER — ACETAMINOPHEN 325 MG PO TABS
650.0000 mg | ORAL_TABLET | Freq: Once | ORAL | Status: AC
  Administered 2020-11-30: 650 mg via ORAL
  Filled 2020-11-30: qty 2

## 2020-11-30 NOTE — ED Notes (Signed)
[name], daughter, [phone] would like an update when available

## 2020-11-30 NOTE — ED Provider Notes (Addendum)
Emergency Medicine Provider Triage Evaluation Note  Stephanie Atkinson , a 87 y.o. female  was evaluated in triage.  Pt complains of feeling unwell.  Has had a cough, chest pain when she coughs for last few days.  Feels generalized weakness and fatigue.  No shortness of breath, nausea or vomiting.  Will occasionally have abdominal pain however none currently.  No urinary complaints.  No recent sick contacts  Chronic anticoag for Afib  Review of Systems  Positive: Generalized weakness, cough, chest pain with cough Negative: Nausea, vomiting, numbness  Physical Exam  There were no vitals taken for this visit. Gen:   Awake, no distress   Resp:  Normal effort  MSK:   Moves extremities without difficulty  Other:    Medical Decision Making  Medically screening exam initiated at 7:52 PM.  Appropriate orders placed.  Stephanie Atkinson was informed that the remainder of the evaluation will be completed by another provider, this initial triage assessment does not replace that evaluation, and the importance of remaining in the ED until their evaluation is complete.  Cough, CP, Gen weakness       Jamela Cumbo A, PA-C 11/30/20 1954    Kommor, Wyn Forster, MD 11/30/20 2315

## 2020-11-30 NOTE — ED Triage Notes (Signed)
Pt with hx of cough, chills and abd pain x 3 weeks.  Seen at UC 3 weeks ago. Pt from [facility].  Now c/o chest pain with coughing.  Bp 162/67 Hr 85 Rr 16 Sats 94% ra 97.7 F 116 cbg

## 2020-12-01 ENCOUNTER — Emergency Department (HOSPITAL_COMMUNITY): Payer: Medicare PPO

## 2020-12-01 DIAGNOSIS — R109 Unspecified abdominal pain: Secondary | ICD-10-CM | POA: Diagnosis not present

## 2020-12-01 LAB — URINALYSIS, ROUTINE W REFLEX MICROSCOPIC
Bilirubin Urine: NEGATIVE
Glucose, UA: NEGATIVE mg/dL
Ketones, ur: NEGATIVE mg/dL
Nitrite: NEGATIVE
Protein, ur: NEGATIVE mg/dL
Specific Gravity, Urine: 1.005 (ref 1.005–1.030)
pH: 8 (ref 5.0–8.0)

## 2020-12-01 MED ORDER — LACTATED RINGERS IV BOLUS
500.0000 mL | Freq: Once | INTRAVENOUS | Status: AC
  Administered 2020-12-01: 500 mL via INTRAVENOUS

## 2020-12-01 MED ORDER — SODIUM CHLORIDE 0.9 % IV SOLN
1.0000 g | Freq: Once | INTRAVENOUS | Status: AC
  Administered 2020-12-01: 1 g via INTRAVENOUS
  Filled 2020-12-01: qty 10

## 2020-12-01 MED ORDER — IOHEXOL 300 MG/ML  SOLN
80.0000 mL | Freq: Once | INTRAMUSCULAR | Status: AC | PRN
  Administered 2020-12-01: 80 mL via INTRAVENOUS

## 2020-12-01 MED ORDER — CEPHALEXIN 500 MG PO CAPS: 500.0000 mg | ORAL_CAPSULE | Freq: Three times a day (TID) | ORAL | 0 refills | Status: DC

## 2020-12-01 MED ORDER — TRAMADOL HCL 50 MG PO TABS
50.0000 mg | ORAL_TABLET | Freq: Once | ORAL | Status: AC
  Administered 2020-12-01: 50 mg via ORAL
  Filled 2020-12-01: qty 1

## 2020-12-01 MED ORDER — APIXABAN 5 MG PO TABS
5.0000 mg | ORAL_TABLET | Freq: Once | ORAL | Status: AC
  Administered 2020-12-01: 5 mg via ORAL
  Filled 2020-12-01: qty 1

## 2020-12-01 NOTE — Discharge Instructions (Addendum)
It was our pleasure to provide your ER care today - we hope that you feel better.  Drink plenty of fluids/stay well hydrated.   Take keflex as prescribed for urine infection.   Follow up with primary care doctor in one week if symptoms fail to improve/resolve.  Return to ER if worse, new symptoms, fevers, new, worsening or severe pain, vomiting, trouble breathing, or other concern.

## 2020-12-01 NOTE — ED Provider Notes (Addendum)
[facility] EMERGENCY DEPARTMENT Provider Note   CSN: [id number] Arrival date & time: 11/30/20  1958     History Chief Complaint  Patient presents with   Chest Pain    Stephanie Atkinson is a 87 y.o. female.  Pt c/o abd pain, dysuria, non prod cough, and fever. Symptoms acute onset in past couple days, moderate, persistent. States started unspecified abx for uti yesterday, but was feeling worse. Denies sore throat or runny nose. No headache. W coughing episode earlier, had a transient sharp chest pain, otherwise denies any current or recent cp. No exertional cp. No sob or unusual doe. No specific known ill contacts. Abd pain is mid to lower abd, bilateral, non radiating. No abd distension. No vomiting. Having normal bms.   The history is provided by the patient, a relative and medical records.  Chest Pain Associated symptoms: abdominal pain, cough and fever   Associated symptoms: no back pain, no headache, no shortness of breath and no vomiting       Past Medical History:  Diagnosis Date   Chronic kidney disease, stage 3 (HCC)    Coronary artery disease    Hx of CABG 2006   LIMA-LAD, free RIMA-PDA, Radial-OM1-OM2   Hyperlipidemia    Hypertension    Hypothyroid    MI, old        PAF (paroxysmal atrial fibrillation) (HCC)    was on amio, d/c'd due to prolonged PR interval, rate control   Presence of permanent cardiac pacemaker 07/16/2017   Presence of stent in right coronary artery 07/12   RIMA-PDA occluded, DES stent placed   Syncope 07/12   bradycardic, beta blocker decreased, also felt to be dehydrated    Patient Active Problem List   Diagnosis Date Noted   Venous stasis dermatitis of both lower extremities 01/06/2020   Unilateral primary osteoarthritis, right knee 01/06/2020   Community acquired bacterial pneumonia 06/23/2019   Acute kidney injury superimposed on chronic kidney disease (HCC) 06/23/2019   Synovitis of right knee 03/02/2019   Chronic  diastolic heart failure (HCC) 09/14/2018   Pacemaker 07/16/2017   Atrial fibrillation with slow ventricular response (HCC) 06/27/2017   Hypercholesterolemia 06/27/2017   Symptomatic bradycardia 06/27/2017   Chronic pain of right knee 05/17/2016   De Quervain's tenosynovitis 05/17/2016   CAD (coronary artery disease) of artery bypass graft 02/13/2015   Encounter for loop recorder check 02/01/2014   Accelerated junctional rhythm 05/04/2013   Dehydration 10/14/2012   Syncope and collapse 10/13/2012   History of first degree atrioventricular block 10/11/2012   Long term (current) use of anticoagulants 10/06/2012   AVB - beta blocker and Amiodarone stopped 07/10/2012   Hypertension    Hx of CABG X 4 3/06-     Dyslipidemia    RCA DES placed 7/12- patent 8/14    Permanent atrial fibrillation (HCC)    Chronic kidney disease, stage 3 (HCC)     Past Surgical History:  Procedure Laterality Date   CARDIAC CATHETERIZATION  101512   dr. Onalee Hua harding, revealing a atretic bypass to her right with patent distal RCA stent   CAROTID STENT  2012   CORONARY ARTERY BYPASS GRAFT  2006   LIMA-LAD, free RIMA-PDA, Radial-OM1-OM2   DOPPLER ECHOCARDIOGRAPHY  993716   mild asymmetric left ventricular hypertrophy, left ventricular systolic function is low normal, ejection fraction = 50-55%, the LA is moderate dilated, the RA is mildly dilated, no significant valvular disease   INSERT / REPLACE / REMOVE PACEMAKER  07/16/2017   JOINT REPLACEMENT     hip replacement   LEFT HEART CATHETERIZATION WITH CORONARY/GRAFT ANGIOGRAM N/A 10/12/2012   Procedure: LEFT HEART CATHETERIZATION WITH Isabel Caprice;  Surgeon: Marykay Lex, MD;  Location: [facility] CATH LAB;  Service: Cardiovascular;  Laterality: N/A;   LOOP RECORDER IMPLANT N/A 10/15/2012   Procedure: LOOP RECORDER IMPLANT;  Surgeon: Thurmon Fair, MD;  Location: [facility] CATH LAB;  Service: Cardiovascular;  Laterality: N/A;   LOOP RECORDER REMOVAL  07/16/2017    LOOP RECORDER REMOVAL N/A 07/16/2017   Procedure: LOOP RECORDER REMOVAL;  Surgeon: Thurmon Fair, MD;  Location: [facility] INVASIVE CV LAB;  Service: Cardiovascular;  Laterality: N/A;   NM MYOVIEW LTD  100510   post stress left ventricle is normal in size, post stress ejection fraction is 67% global left ventricular systolic function is normal, normal myocardial perfusion study, abnormal myocardial perfusion study, low risk scan   PACEMAKER IMPLANT N/A 07/16/2017   Procedure: PACEMAKER IMPLANT;  Surgeon: Thurmon Fair, MD;  Location: [facility] INVASIVE CV LAB;  Service: Cardiovascular;  Laterality: N/A;   THYROIDECTOMY       OB History   No obstetric history on file.     Family History  Problem Relation Age of Onset   CAD Mother    CAD Maternal Grandmother     Social History   Tobacco Use   Smoking status: Never   Smokeless tobacco: Current    Types: Snuff  Vaping Use   Vaping Use: Never used  Substance Use Topics   Alcohol use: No   Drug use: No    Home Medications Prior to Admission medications   Medication Sig Start Date End Date Taking? Authorizing Provider  acetaminophen (TYLENOL) 500 MG tablet Take 1 tablet (500 mg total) by mouth every 6 (six) hours as needed for moderate pain. 06/27/19   Briant Cedar, MD  amLODipine (NORVASC) 10 MG tablet Take 1 tablet (10 mg total) by mouth daily. 06/22/20   Runell Gess, MD  atorvastatin (LIPITOR) 80 MG tablet Take 80 mg by mouth at bedtime.     [provider]  benzonatate (TESSALON) 100 MG capsule Take 1 capsule by mouth every 8 (eight) hours for cough. 11/06/20   Mardella Layman, MD  cholecalciferol (VITAMIN D3) 25 MCG (1000 UT) tablet Take 1,000 Units by mouth daily.    [provider]  cyclobenzaprine (FLEXERIL) 10 MG tablet 1/2 - 1 tablet 05/30/16   [provider]  diphenhydrAMINE (BENADRYL) 25 MG tablet Take 25 mg by mouth every 6 (six) hours as needed for allergies.    [provider]   ELIQUIS 5 MG TABS tablet TAKE 1 TABLET BY MOUTH TWICE A DAY 08/16/20   Runell Gess, MD  furosemide (LASIX) 20 MG tablet TAKE 1 TABLET BY MOUTH EVERY DAY 12/24/19   Runell Gess, MD  hydroxypropyl methylcellulose / hypromellose (ISOPTO TEARS / GONIOVISC) 2.5 % ophthalmic solution Place 1 drop into both eyes 3 (three) times daily as needed for dry eyes.    [provider]  hydrOXYzine (ATARAX/VISTARIL) 25 MG tablet Take 25 mg by mouth 2 (two) times daily as needed.    [provider]  levothyroxine (SYNTHROID, LEVOTHROID) 50 MCG tablet Take 50 mcg by mouth daily before breakfast.     [provider]  nebivolol (BYSTOLIC) 5 MG tablet Take 5 mg by mouth daily.    [provider]  nitroGLYCERIN (NITROSTAT) 0.4 MG SL tablet Place 1 tablet (0.4 mg total)  under the tongue every 5 (five) minutes as needed for chest pain. 04/26/20   Abelino Derrick, PA-C  spironolactone (ALDACTONE) 25 MG tablet TAKE 1/2 TABLET DAILY. PLEASE NOTE DOSAGE CHANGE. Patient taking differently: Take 25 mg by mouth daily. 12/24/19   Runell Gess, MD  traMADol (ULTRAM) 50 MG tablet Take 50 mg by mouth every 6 (six) hours as needed (for pain).     [provider]  valsartan (DIOVAN) 160 MG tablet Take 160 mg by mouth daily. 03/06/18   [provider]    Allergies    Penicillins and Sulfa antibiotics  Review of Systems   Review of Systems  Constitutional:  Positive for fever.  HENT:  Negative for sore throat.   Eyes:  Negative for redness.  Respiratory:  Positive for cough. Negative for shortness of breath.   Cardiovascular:  Positive for chest pain.  Gastrointestinal:  Positive for abdominal pain. Negative for diarrhea and vomiting.  Genitourinary:  Positive for dysuria. Negative for flank pain.  Musculoskeletal:  Negative for back pain, neck pain and neck stiffness.  Skin:  Negative for rash.  Neurological:  Negative for headaches.  Hematological:  Does not  bruise/bleed easily.  Psychiatric/Behavioral:  Negative for confusion.    Physical Exam Updated Vital Signs BP (!) 143/56 (BP Location: Right Arm)   Pulse 75   Temp 98.4 F (36.9 C) (Oral)   Resp 19   Ht 1.6 m (5\' 3" )   Wt 69.9 kg   SpO2 98%   BMI 27.28 kg/m   Physical Exam Vitals and nursing note reviewed.  Constitutional:      Appearance: Normal appearance. She is well-developed.  HENT:     Head: Atraumatic.     Nose: Nose normal.     Mouth/Throat:     Mouth: Mucous membranes are moist.  Eyes:     General: No scleral icterus.    Conjunctiva/sclera: Conjunctivae normal.     Pupils: Pupils are equal, round, and reactive to light.  Neck:     Trachea: No tracheal deviation.     Comments: No stiffness or rigidity.  Cardiovascular:     Rate and Rhythm: Normal rate and regular rhythm.     Pulses: Normal pulses.     Heart sounds: Normal heart sounds. No murmur heard.   No friction rub. No gallop.  Pulmonary:     Effort: Pulmonary effort is normal. No respiratory distress.     Breath sounds: Normal breath sounds.  Abdominal:     General: Bowel sounds are normal. There is no distension.     Palpations: Abdomen is soft. There is no mass.     Tenderness: There is abdominal tenderness. There is no guarding.     Comments: Tenderness bil mid to lower abd.   Genitourinary:    Comments: No cva tenderness.  Musculoskeletal:     Cervical back: Normal range of motion and neck supple. No rigidity. No muscular tenderness.     Comments: Mild symmetric bilateral leg edema.   Skin:    General: Skin is warm and dry.     Findings: No rash.  Neurological:     Mental Status: She is alert.     Comments: Alert, speech normal.   Psychiatric:        Mood and Affect: Mood normal.    ED Results / Procedures / Treatments   Labs (all labs ordered are listed, but only abnormal results are displayed) Results for orders placed or performed during  the hospital encounter of 11/30/20  Resp  Panel by RT-PCR (Flu A&B, Covid) Nasopharyngeal Swab   Specimen: Nasopharyngeal Swab; Nasopharyngeal(NP) swabs in vial transport medium  Result Value Ref Range   SARS Coronavirus 2 by RT PCR NEGATIVE NEGATIVE   Influenza A by PCR NEGATIVE NEGATIVE   Influenza B by PCR NEGATIVE NEGATIVE  Blood culture (routine x 2)   Specimen: BLOOD  Result Value Ref Range   Specimen Description BLOOD RIGHT ARM    Special Requests      BOTTLES DRAWN AEROBIC AND ANAEROBIC Blood Culture results may not be optimal due to an excessive volume of blood received in culture bottles   Culture  Setup Time      AEROBIC BOTTLE ONLY NO ORGANISMS SEEN REINCUBATED 12/01/20@4 :43 BY TW    Culture      NO GROWTH < 24 HOURS Performed at [facility], [address], [place], [place] [address]    Report Status PENDING   Blood culture (routine x 2)   Specimen: BLOOD  Result Value Ref Range   Specimen Description BLOOD LEFT ARM    Special Requests      BOTTLES DRAWN AEROBIC ONLY Blood Culture results may not be optimal due to an excessive volume of blood received in culture bottles   Culture      NO GROWTH < 24 HOURS Performed at [facility], [address], [place], [place] [address]    Report Status PENDING   CBC with Differential  Result Value Ref Range   WBC 11.4 (H) 4.0 - 10.5 K/uL   RBC 5.12 (H) 3.87 - 5.11 MIL/uL   Hemoglobin 13.9 12.0 - 15.0 g/dL   HCT 37.1 69.6 - 78.9 %   MCV 85.7 80.0 - 100.0 fL   MCH 27.1 26.0 - 34.0 pg   MCHC 31.7 30.0 - 36.0 g/dL   RDW 38.1 01.7 - 51.0 %   Platelets 199 150 - 400 K/uL   nRBC 0.0 0.0 - 0.2 %   Neutrophils Relative % 93 %   Neutro Abs 10.4 (H) 1.7 - 7.7 K/uL   Lymphocytes Relative 3 %   Lymphs Abs 0.4 (L) 0.7 - 4.0 K/uL   Monocytes Relative 4 %   Monocytes Absolute 0.5 0.1 - 1.0 K/uL   Eosinophils Relative 0 %   Eosinophils Absolute 0.0 0.0 - 0.5 K/uL   Basophils Relative 0 %   Basophils Absolute 0.0 0.0 - 0.1 K/uL   Immature Granulocytes 0 %    Abs Immature Granulocytes 0.03 0.00 - 0.07 K/uL  Comprehensive metabolic panel  Result Value Ref Range   Sodium 138 135 - 145 mmol/L   Potassium 3.5 3.5 - 5.1 mmol/L   Chloride 104 98 - 111 mmol/L   CO2 23 22 - 32 mmol/L   Glucose, Bld 103 (H) 70 - 99 mg/dL   BUN 13 8 - 23 mg/dL   Creatinine, Ser 2.58 (H) 0.44 - 1.00 mg/dL   Calcium 52.7 (H) 8.9 - 10.3 mg/dL   Total Protein 6.6 6.5 - 8.1 g/dL   Albumin 3.6 3.5 - 5.0 g/dL   AST 21 15 - 41 U/L   ALT 20 0 - 44 U/L   Alkaline Phosphatase 71 38 - 126 U/L   Total Bilirubin 1.1 0.3 - 1.2 mg/dL   GFR, Estimated 42 (L) >60 mL/min   Anion gap 11 5 - 15  Lactic acid, plasma  Result Value Ref Range   Lactic Acid, Venous  1.9 0.5 - 1.9 mmol/L  Urinalysis, Routine w reflex microscopic Urine, Clean Catch  Result Value Ref Range   Color, Urine YELLOW YELLOW   APPearance HAZY (A) CLEAR   Specific Gravity, Urine 1.005 1.005 - 1.030   pH 8.0 5.0 - 8.0   Glucose, UA NEGATIVE NEGATIVE mg/dL   Hgb urine dipstick MODERATE (A) NEGATIVE   Bilirubin Urine NEGATIVE NEGATIVE   Ketones, ur NEGATIVE NEGATIVE mg/dL   Protein, ur NEGATIVE NEGATIVE mg/dL   Nitrite NEGATIVE NEGATIVE   Leukocytes,Ua LARGE (A) NEGATIVE   RBC / HPF 0-5 0 - 5 RBC/hpf   WBC, UA 11-20 0 - 5 WBC/hpf   Bacteria, UA MANY (A) NONE SEEN   Squamous Epithelial / LPF 0-5 0 - 5  Troponin I (High Sensitivity)  Result Value Ref Range   Troponin I (High Sensitivity) 19 (H) <18 ng/L  Troponin I (High Sensitivity)  Result Value Ref Range   Troponin I (High Sensitivity) 22 (H) <18 ng/L   DG Chest 2 View  Result Date: 11/30/2020 CLINICAL DATA:  Cough, chills EXAM: CHEST - 2 VIEW COMPARISON:  Chest radiograph 11/06/2020 FINDINGS: A left chest wall cardiac device and single lead is stable. Median sternotomy wires and mediastinal surgical clips are stable. The heart is enlarged, unchanged. The mediastinal contours are stable. Reticular opacities in the lung bases likely reflect subsegmental  atelectasis. There is no new focal consolidation. There is no pulmonary edema. There is no significant pleural effusion. There is no pneumothorax. Degenerative changes of the shoulders are again seen. There is no acute osseous abnormality. IMPRESSION: Bibasilar subsegmental atelectasis and unchanged cardiomegaly. Otherwise, no radiographic evidence of acute cardiopulmonary process. Electronically Signed   By: Lesia Hausen M.D.   On: 11/30/2020 20:42   DG Chest 2 View  Result Date: 11/06/2020 CLINICAL DATA:  Cough x2 weeks. EXAM: CHEST - 2 VIEW COMPARISON:  March 29, 2020 FINDINGS: Left chest pacemaker with lead overlying the right ventricle. Prior median sternotomy and CABG. Aortic atherosclerosis. Similar cardiac enlargement. Linear opacities in the bilateral lung bases, favor atelectasis. Chronic bilateral pleural thickening. Severe left shoulder degenerative change. Right upper quadrant surgical clips. IMPRESSION: 1. Bibasilar atelectasis. 2. Stable cardiac enlargement without overt pulmonary edema. 3.  Aortic Atherosclerosis (ICD10-I70.0). Electronically Signed   By: Maudry Mayhew M.D.   On: 11/06/2020 10:22    EKG EKG Interpretation  Date/Time:  Thursday November 30 2020 19:59:04 EDT Ventricular Rate:  69 PR Interval:    QRS Duration: 92 QT Interval:  430 QTC Calculation: 460 R Axis:   66 Text Interpretation: Electronic ventricular pacemaker Confirmed by Cathren Laine ([id number]) on 12/01/2020 7:54:37 AM  Radiology DG Chest 2 View  Result Date: 11/30/2020 CLINICAL DATA:  Cough, chills EXAM: CHEST - 2 VIEW COMPARISON:  Chest radiograph 11/06/2020 FINDINGS: A left chest wall cardiac device and single lead is stable. Median sternotomy wires and mediastinal surgical clips are stable. The heart is enlarged, unchanged. The mediastinal contours are stable. Reticular opacities in the lung bases likely reflect subsegmental atelectasis. There is no new focal consolidation. There is no pulmonary edema.  There is no significant pleural effusion. There is no pneumothorax. Degenerative changes of the shoulders are again seen. There is no acute osseous abnormality. IMPRESSION: Bibasilar subsegmental atelectasis and unchanged cardiomegaly. Otherwise, no radiographic evidence of acute cardiopulmonary process. Electronically Signed   By: Lesia Hausen M.D.   On: 11/30/2020 20:42   CT Abdomen Pelvis W Contrast  Result Date: 12/01/2020 CLINICAL DATA:  Cough,  chills, abdominal pain x3 weeks. EXAM: CT ABDOMEN AND PELVIS WITH CONTRAST TECHNIQUE: Multidetector CT imaging of the abdomen and pelvis was performed using the standard protocol following bolus administration of intravenous contrast. CONTRAST:  81mL OMNIPAQUE IOHEXOL 300 MG/ML  SOLN COMPARISON:  CT abdomen pelvis 06/19/2020 FINDINGS: Lower chest: Subsegmental atelectasis in the lower lobes. Partially visualized lead in the right ventricle. Postoperative changes of CABG. Hepatobiliary: No focal liver abnormality is seen. Status post cholecystectomy. No biliary dilatation. Pancreas: A few calcifications scattered calcifications are noted in the pancreatic parenchyma. No pancreatic ductal dilatation or surrounding inflammatory changes. Spleen: Normal in size without focal abnormality. Adrenals/Urinary Tract: Adrenal glands are unremarkable. Multiple bilateral simple cortical renal cysts measuring up to 2.8 cm the middle third of the left kidney (series 3, image 26). No renal calculi or hydronephrosis. Bladder is unremarkable. Stomach/Bowel: Stomach is within normal limits. Appendix appears normal. Diverticulosis of the descending and sigmoid colon. No evidence of bowel wall thickening, distention, or inflammatory changes. Vascular/Lymphatic: Diffuse severe calcific atherosclerosis of the abdominal aorta and iliac arteries. No abdominal aortic aneurysm. No enlarged lymph nodes in the abdomen or pelvis. Reproductive: Uterus and bilateral adnexa are unremarkable. Other:  No abdominal wall hernia or abnormality. No abdominopelvic ascites. Musculoskeletal: Right total hip prosthesis is intact and in appropriate position. Severe left hip osteoarthritis. Mild rightward curve of the lumbar spine. Multilevel degenerative disc disease throughout the thoracolumbar spine. No suspicious osseous lesion. IMPRESSION: 1. No acute abnormality in the abdomen or pelvis. 2. Diverticulosis of the descending and sigmoid colon without findings of diverticulitis. 3. Severe aortic atherosclerosis. Aortic Atherosclerosis (ICD10-I70.0). 4. Bilateral benign renal cysts. Electronically Signed   By: Sherron Ales M.D.   On: 12/01/2020 13:29    Procedures Procedures   Medications Ordered in ED Medications  lactated ringers bolus 500 mL (has no administration in time range)  acetaminophen (TYLENOL) tablet 650 mg (650 mg Oral Given 11/30/20 2018)    ED Course  I have reviewed the triage vital signs and the nursing notes.  Pertinent labs & imaging results that were available during my care of the patient were reviewed by me and considered in my medical decision making (see chart for details).    MDM Rules/Calculators/A&P                          Iv LR bolus.   Continuous pulse ox and cardiac monitoring. Stat labs. Imaging.   Reviewed nursing notes and prior charts for additional history.   Labs reviewed/interpreted by me - wbc mildly high. Lactate normal. Covid neg.  UA is pending.   Cxr reviewed/interpreted by me - no pna.   CT reviewed/interpreted by me - neg acute.   Pt now asking for po. Po fluids/food provided.   Additional labs reviewed/interpreted by me - UA with 11-20 wbc and many bacteria. Already on abx, so will not add cx. Dose of rocephin in ED.   Pt is unclear on what abx is taking, name or dose. As such, fever, uti, will give rx keflex.   Po fluids/food.   Pt currently appears stable for d/c.      Final Clinical Impression(s) / ED Diagnoses Final diagnoses:   None    Rx / DC Orders ED Discharge Orders     None            Cathren Laine, MD 12/01/20 1432

## 2020-12-01 NOTE — ED Notes (Signed)
[name] (daughter) [phone]

## 2020-12-05 LAB — CULTURE, BLOOD (ROUTINE X 2)
Culture: NO GROWTH
Culture: NO GROWTH

## 2020-12-11 DIAGNOSIS — I129 Hypertensive chronic kidney disease with stage 1 through stage 4 chronic kidney disease, or unspecified chronic kidney disease: Secondary | ICD-10-CM | POA: Diagnosis not present

## 2020-12-11 DIAGNOSIS — Z23 Encounter for immunization: Secondary | ICD-10-CM | POA: Diagnosis not present

## 2020-12-11 DIAGNOSIS — G8929 Other chronic pain: Secondary | ICD-10-CM | POA: Diagnosis not present

## 2020-12-11 DIAGNOSIS — N1832 Chronic kidney disease, stage 3b: Secondary | ICD-10-CM | POA: Diagnosis not present

## 2020-12-11 DIAGNOSIS — E559 Vitamin D deficiency, unspecified: Secondary | ICD-10-CM | POA: Diagnosis not present

## 2020-12-11 DIAGNOSIS — N390 Urinary tract infection, site not specified: Secondary | ICD-10-CM | POA: Diagnosis not present

## 2020-12-11 DIAGNOSIS — E039 Hypothyroidism, unspecified: Secondary | ICD-10-CM | POA: Diagnosis not present

## 2020-12-11 DIAGNOSIS — E785 Hyperlipidemia, unspecified: Secondary | ICD-10-CM | POA: Diagnosis not present

## 2020-12-11 DIAGNOSIS — I5032 Chronic diastolic (congestive) heart failure: Secondary | ICD-10-CM | POA: Diagnosis not present

## 2020-12-11 DIAGNOSIS — I480 Paroxysmal atrial fibrillation: Secondary | ICD-10-CM | POA: Diagnosis not present

## 2020-12-29 DIAGNOSIS — Z20822 Contact with and (suspected) exposure to covid-19: Secondary | ICD-10-CM | POA: Diagnosis not present

## 2021-01-06 ENCOUNTER — Other Ambulatory Visit: Payer: Self-pay | Admitting: Cardiovascular Disease

## 2021-01-12 ENCOUNTER — Ambulatory Visit (INDEPENDENT_AMBULATORY_CARE_PROVIDER_SITE_OTHER): Payer: Medicare PPO

## 2021-01-12 DIAGNOSIS — I4821 Permanent atrial fibrillation: Secondary | ICD-10-CM | POA: Diagnosis not present

## 2021-01-12 LAB — CUP PACEART REMOTE DEVICE CHECK
Battery Remaining Longevity: 101 mo
Battery Voltage: 3 V
Brady Statistic RV Percent Paced: 96.9 %
Date Time Interrogation Session: 20221117234257
Implantable Lead Implant Date: 20190522
Implantable Lead Location: 753860
Implantable Lead Model: 5076
Implantable Pulse Generator Implant Date: 20190522
Lead Channel Impedance Value: 342 Ohm
Lead Channel Impedance Value: 418 Ohm
Lead Channel Pacing Threshold Amplitude: 0.625 V
Lead Channel Pacing Threshold Pulse Width: 0.4 ms
Lead Channel Sensing Intrinsic Amplitude: 10.25 mV
Lead Channel Sensing Intrinsic Amplitude: 10.25 mV
Lead Channel Setting Pacing Amplitude: 2.5 V
Lead Channel Setting Pacing Pulse Width: 0.4 ms
Lead Channel Setting Sensing Sensitivity: 1.2 mV

## 2021-01-22 NOTE — Progress Notes (Signed)
Remote pacemaker transmission.   

## 2021-01-26 ENCOUNTER — Telehealth: Payer: Self-pay | Admitting: Cardiovascular Disease

## 2021-01-26 NOTE — Telephone Encounter (Signed)
Daughter of the patient called. She would like the office to leave the response of the question on the patient's home answering machine

## 2021-01-26 NOTE — Telephone Encounter (Signed)
Attempted to contact patient to discuss medication issue.  LVM to call back, left call back number.

## 2021-01-26 NOTE — Telephone Encounter (Signed)
Patient's daughter is calling about spironolactone (ALDACTONE) 25 MG tablet. She states it is hard to cut the pill is half as it is a very small pill. She wants to make sure she is to take a half a pill.

## 2021-01-29 ENCOUNTER — Other Ambulatory Visit: Payer: Self-pay

## 2021-01-29 DIAGNOSIS — N898 Other specified noninflammatory disorders of vagina: Secondary | ICD-10-CM | POA: Diagnosis not present

## 2021-01-29 DIAGNOSIS — K625 Hemorrhage of anus and rectum: Secondary | ICD-10-CM | POA: Diagnosis not present

## 2021-01-29 MED ORDER — SPIRONOLACTONE 25 MG PO TABS: 25.0000 mg | ORAL_TABLET | Freq: Every day | ORAL | 1 refills | Status: DC

## 2021-01-29 NOTE — Telephone Encounter (Signed)
Called patient, spoke to daughter in regards to message from MD.  Updated med list.

## 2021-01-29 NOTE — Progress Notes (Signed)
Called patient daughter back, advised of message from MD in regards to the 25 mg. Will update this to pharmacy per daughters request.

## 2021-01-30 ENCOUNTER — Telehealth: Payer: Self-pay

## 2021-01-30 NOTE — Telephone Encounter (Signed)
Janace Aris (daughter) concerned that furosemide and spironolactone are keeping patient up all night with voiding. Wants to know if both diuretics are necessary. Has intermittent edema and SOB. May leave VM.

## 2021-01-31 NOTE — Telephone Encounter (Signed)
Spoke with patient and advised her to continue on the medication that Dr. Allyson Sabal ordered. She stated she was taking one-half tablet of spironolactone. I explained the dose was 25 mg, to take the whole tablet. Patient voiced understanding.

## 2021-01-31 NOTE — Telephone Encounter (Signed)
Patient's daughter called wanting to know if she should be taking spironolactone (ALDACTONE) 25 MG tablet and furosemide (LASIX) 20 MG tablet together.

## 2021-01-31 NOTE — Telephone Encounter (Signed)
Spoke with patient. She reports having to get up every 2 hours at night to void. She takes diuretics spironolactone and furosemide in the mornings. She does have leg edema, wears support stockings, and elevates her legs. Her weight 3 weeks ago was 150 lb, and now, 143 lbs. She wants to know if she needs both diuretics. Please advise.

## 2021-02-02 DIAGNOSIS — K625 Hemorrhage of anus and rectum: Secondary | ICD-10-CM | POA: Diagnosis not present

## 2021-02-12 ENCOUNTER — Other Ambulatory Visit: Payer: Self-pay | Admitting: Cardiovascular Disease

## 2021-02-14 ENCOUNTER — Telehealth: Payer: Self-pay | Admitting: Orthopaedic Surgery

## 2021-02-14 ENCOUNTER — Other Ambulatory Visit: Payer: Self-pay | Admitting: Orthopaedic Surgery

## 2021-02-14 NOTE — Telephone Encounter (Signed)
I left voicemail for Stephanie Atkinson advising.

## 2021-02-14 NOTE — Telephone Encounter (Signed)
Please advise 

## 2021-02-14 NOTE — Telephone Encounter (Signed)
Pt's daughter [name] called requesting a refill of tramadol. Please send to [facility] on [address]. Please call [name] at [phone] when med has been sent in.

## 2021-03-19 DIAGNOSIS — N390 Urinary tract infection, site not specified: Secondary | ICD-10-CM | POA: Diagnosis not present

## 2021-03-19 DIAGNOSIS — R7303 Prediabetes: Secondary | ICD-10-CM | POA: Diagnosis not present

## 2021-03-19 DIAGNOSIS — N1832 Chronic kidney disease, stage 3b: Secondary | ICD-10-CM | POA: Diagnosis not present

## 2021-03-19 DIAGNOSIS — E785 Hyperlipidemia, unspecified: Secondary | ICD-10-CM | POA: Diagnosis not present

## 2021-03-19 DIAGNOSIS — E039 Hypothyroidism, unspecified: Secondary | ICD-10-CM | POA: Diagnosis not present

## 2021-03-19 DIAGNOSIS — E559 Vitamin D deficiency, unspecified: Secondary | ICD-10-CM | POA: Diagnosis not present

## 2021-03-20 ENCOUNTER — Ambulatory Visit: Payer: Medicare PPO | Admitting: Cardiovascular Disease

## 2021-03-26 DIAGNOSIS — I129 Hypertensive chronic kidney disease with stage 1 through stage 4 chronic kidney disease, or unspecified chronic kidney disease: Secondary | ICD-10-CM | POA: Diagnosis not present

## 2021-03-26 DIAGNOSIS — N1832 Chronic kidney disease, stage 3b: Secondary | ICD-10-CM | POA: Diagnosis not present

## 2021-03-26 DIAGNOSIS — Z0000 Encounter for general adult medical examination without abnormal findings: Secondary | ICD-10-CM | POA: Diagnosis not present

## 2021-03-26 DIAGNOSIS — I480 Paroxysmal atrial fibrillation: Secondary | ICD-10-CM | POA: Diagnosis not present

## 2021-03-26 DIAGNOSIS — E039 Hypothyroidism, unspecified: Secondary | ICD-10-CM | POA: Diagnosis not present

## 2021-03-26 DIAGNOSIS — I5032 Chronic diastolic (congestive) heart failure: Secondary | ICD-10-CM | POA: Diagnosis not present

## 2021-03-26 DIAGNOSIS — R7303 Prediabetes: Secondary | ICD-10-CM | POA: Diagnosis not present

## 2021-03-26 DIAGNOSIS — E785 Hyperlipidemia, unspecified: Secondary | ICD-10-CM | POA: Diagnosis not present

## 2021-03-26 DIAGNOSIS — R600 Localized edema: Secondary | ICD-10-CM | POA: Diagnosis not present

## 2021-03-27 ENCOUNTER — Other Ambulatory Visit: Payer: Self-pay | Admitting: Cardiovascular Disease

## 2021-04-13 ENCOUNTER — Ambulatory Visit (INDEPENDENT_AMBULATORY_CARE_PROVIDER_SITE_OTHER): Payer: Medicare PPO

## 2021-04-13 DIAGNOSIS — I4821 Permanent atrial fibrillation: Secondary | ICD-10-CM

## 2021-04-13 LAB — CUP PACEART REMOTE DEVICE CHECK
Battery Remaining Longevity: 98 mo
Battery Voltage: 2.99 V
Brady Statistic RV Percent Paced: 97.94 %
Date Time Interrogation Session: 20230216234331
Implantable Lead Implant Date: 20190522
Implantable Lead Location: 753860
Implantable Lead Model: 5076
Implantable Pulse Generator Implant Date: 20190522
Lead Channel Impedance Value: 342 Ohm
Lead Channel Impedance Value: 418 Ohm
Lead Channel Pacing Threshold Amplitude: 0.75 V
Lead Channel Pacing Threshold Pulse Width: 0.4 ms
Lead Channel Sensing Intrinsic Amplitude: 10.125 mV
Lead Channel Sensing Intrinsic Amplitude: 10.125 mV
Lead Channel Setting Pacing Amplitude: 2.5 V
Lead Channel Setting Pacing Pulse Width: 0.4 ms
Lead Channel Setting Sensing Sensitivity: 1.2 mV

## 2021-04-18 NOTE — Progress Notes (Signed)
Remote pacemaker transmission.   

## 2021-05-02 ENCOUNTER — Telehealth: Payer: Self-pay | Admitting: *Deleted

## 2021-05-02 NOTE — Telephone Encounter (Signed)
Received call from [facility] to confirm dose of nebivolol-advised per last OV patient was taking Bystolic 5 mg daily (added to med list at OV, not noted in note). ? ?Called [facility]-last fill date 08/2020.  ? ?Upcoming appt 3/20 ? ? ?Attempted to call [name] at [facility] to discuss-unable to reach.   ?

## 2021-05-03 ENCOUNTER — Telehealth: Payer: Self-pay | Admitting: Cardiovascular Disease

## 2021-05-03 NOTE — Telephone Encounter (Signed)
Attempted to contact Stephanie Atkinson back to discuss medication.  ?Left message to call back.  ? ?

## 2021-05-03 NOTE — Telephone Encounter (Signed)
See telephone note from today by Darene Lamer, LPN ?

## 2021-05-03 NOTE — Telephone Encounter (Signed)
?  [name] at [facility], they would like to get pt's meds list that Dr. Allyson Sabal managed for the pt ?

## 2021-05-03 NOTE — Telephone Encounter (Signed)
Received call from PCP office- discussed medications on list that is prescribed by Dr.Berry.  ? ?They verbalized understanding.  ?Thankful for call back. ? ?

## 2021-05-07 MED ORDER — NITROGLYCERIN 0.4 MG SL SUBL: 0.4000 mg | SUBLINGUAL_TABLET | SUBLINGUAL | 6 refills | Status: DC | PRN

## 2021-05-07 NOTE — Addendum Note (Signed)
Addended by: Sandi Mariscal on: 05/07/2021 04:35 PM ? ? Modules accepted: Orders ? ?

## 2021-05-07 NOTE — Telephone Encounter (Signed)
Returned the call to the daughter. She stated that the Bystolic 5 mg was added to her mother's medication list but she is not taking this. She was calling to see when it was added. Her home nurse stated that Dr. Allyson Sabal wanted her on it but there was not a documentation in epic on this. She wants to know if the patient was supposed to be on it. ? ? ?

## 2021-05-07 NOTE — Telephone Encounter (Signed)
Pt c/o medication issue: ? ?1. Name of Medication:  ? ?nebivolol (BYSTOLIC) 5 MG tablet ? ?2. How are you currently taking this medication (dosage and times per day)?  ? ?3. Are you having a reaction (difficulty breathing--STAT)?  ? ?4. What is your medication issue?  ? ?Patient's daughter is following up, requesting to go over medication list. Specifically, she would like to confirm whether or not patient should be taking Nebivolol - discuss instructions + possible refills.  ? ?

## 2021-05-14 ENCOUNTER — Ambulatory Visit: Payer: Medicare PPO | Admitting: Physician Assistant

## 2021-05-16 ENCOUNTER — Telehealth: Payer: Self-pay | Admitting: Cardiovascular Disease

## 2021-05-16 NOTE — Telephone Encounter (Signed)
[facility] calling on behalf of the pt wanting [place] to check in with her due to her bp... last two readings were 163/78 and 153/88 working prior to tracking bp but states that bp normally runs in 150's 160's no nausea vomiting/ chest pains/ or sob please advise the pt further ?

## 2021-05-16 NOTE — Telephone Encounter (Signed)
LMTCB

## 2021-05-21 ENCOUNTER — Ambulatory Visit: Payer: Medicare PPO | Admitting: General Practice

## 2021-05-21 ENCOUNTER — Other Ambulatory Visit: Payer: Self-pay

## 2021-05-21 ENCOUNTER — Encounter: Payer: Self-pay | Admitting: General Practice

## 2021-05-21 ENCOUNTER — Other Ambulatory Visit: Payer: Self-pay | Admitting: Cardiovascular Disease

## 2021-05-21 VITALS — BP 140/64 | HR 74 | Ht 62.0 in | Wt 140.2 lb

## 2021-05-21 DIAGNOSIS — N183 Chronic kidney disease, stage 3 unspecified: Secondary | ICD-10-CM | POA: Diagnosis not present

## 2021-05-21 DIAGNOSIS — I10 Essential (primary) hypertension: Secondary | ICD-10-CM

## 2021-05-21 DIAGNOSIS — R7303 Prediabetes: Secondary | ICD-10-CM | POA: Diagnosis not present

## 2021-05-21 DIAGNOSIS — Z79899 Other long term (current) drug therapy: Secondary | ICD-10-CM | POA: Diagnosis not present

## 2021-05-21 DIAGNOSIS — I4891 Unspecified atrial fibrillation: Secondary | ICD-10-CM

## 2021-05-21 DIAGNOSIS — I25810 Atherosclerosis of coronary artery bypass graft(s) without angina pectoris: Secondary | ICD-10-CM | POA: Diagnosis not present

## 2021-05-21 DIAGNOSIS — E7800 Pure hypercholesterolemia, unspecified: Secondary | ICD-10-CM

## 2021-05-21 DIAGNOSIS — N1832 Chronic kidney disease, stage 3b: Secondary | ICD-10-CM | POA: Diagnosis not present

## 2021-05-21 DIAGNOSIS — I129 Hypertensive chronic kidney disease with stage 1 through stage 4 chronic kidney disease, or unspecified chronic kidney disease: Secondary | ICD-10-CM | POA: Diagnosis not present

## 2021-05-21 DIAGNOSIS — I5032 Chronic diastolic (congestive) heart failure: Secondary | ICD-10-CM | POA: Diagnosis not present

## 2021-05-21 MED ORDER — VALSARTAN 80 MG PO TABS: 80.0000 mg | ORAL_TABLET | Freq: Every day | ORAL | 6 refills | Status: DC

## 2021-05-21 NOTE — Progress Notes (Signed)
? ?Cardiology Clinic Note  ? ?Patient Name: Stephanie Atkinson ?Date of Encounter: 05/21/2021 ? ?Primary Care Provider:  [facility] ?Primary Cardiologist:  Quay Burow, MD ? ?Patient Profile  ?  ?Stephanie Atkinson 88 year old female presents to the clinic today for follow-up evaluation of her hypertension and coronary artery disease. ? ?Past Medical History  ?  ?Past Medical History:  ?Diagnosis Date  ? Chronic kidney disease, stage 3 (HCC)   ? Coronary artery disease   ? Hx of CABG 2006  ? LIMA-LAD, free RIMA-PDA, Radial-OM1-OM2  ? Hyperlipidemia   ? Hypertension   ? Hypothyroid   ? MI, old   ?    ? PAF (paroxysmal atrial fibrillation) ([place])   ? was on amio, d/c'd due to prolonged PR interval, rate control  ? Presence of permanent cardiac pacemaker 07/16/2017  ? Presence of stent in right coronary artery 07/12  ? RIMA-PDA occluded, DES stent placed  ? Syncope 07/12  ? bradycardic, beta blocker decreased, also felt to be dehydrated  ? ?Past Surgical History:  ?Procedure Laterality Date  ? CARDIAC CATHETERIZATION  [id number]  ? dr. Shanon Brow harding, revealing a atretic bypass to her right with patent distal RCA stent  ? CAROTID STENT  2012  ? CORONARY ARTERY BYPASS GRAFT  2006  ? LIMA-LAD, free RIMA-PDA, Radial-OM1-OM2  ? DOPPLER ECHOCARDIOGRAPHY  [id number]  ? mild asymmetric left ventricular hypertrophy, left ventricular systolic function is low normal, ejection fraction = 50-55%, the LA is moderate dilated, the RA is mildly dilated, no significant valvular disease  ? INSERT / REPLACE / REMOVE PACEMAKER  07/16/2017  ? JOINT REPLACEMENT    ? hip replacement  ? LEFT HEART CATHETERIZATION WITH CORONARY/GRAFT ANGIOGRAM N/A 10/12/2012  ? Procedure: LEFT HEART CATHETERIZATION WITH Beatrix Fetters;  Surgeon: Leonie Man, MD;  Location: [facility] CATH LAB;  Service: Cardiovascular;  Laterality: N/A;  ? LOOP RECORDER IMPLANT N/A 10/15/2012  ? Procedure: LOOP RECORDER IMPLANT;  Surgeon: Sanda Klein, MD;  Location:  [facility] CATH LAB;  Service: Cardiovascular;  Laterality: N/A;  ? LOOP RECORDER REMOVAL  07/16/2017  ? LOOP RECORDER REMOVAL N/A 07/16/2017  ? Procedure: LOOP RECORDER REMOVAL;  Surgeon: Sanda Klein, MD;  Location: [facility] CV LAB;  Service: Cardiovascular;  Laterality: N/A;  ? [place]  [id number]  ? post stress left ventricle is normal in size, post stress ejection fraction is 67% global left ventricular systolic function is normal, normal myocardial perfusion study, abnormal myocardial perfusion study, low risk scan  ? PACEMAKER IMPLANT N/A 07/16/2017  ? Procedure: PACEMAKER IMPLANT;  Surgeon: Sanda Klein, MD;  Location: [facility] CV LAB;  Service: Cardiovascular;  Laterality: N/A;  ? THYROIDECTOMY    ? ? ?Allergies ? ?Allergies  ?Allergen Reactions  ? Penicillins Hives  ?  Did it involve swelling of the face/tongue/throat, SOB, or low BP? No ?Did it involve sudden or severe rash/hives, skin peeling, or any reaction on the inside of your mouth or nose? No ?Did you need to seek medical attention at a hospital or doctor's office? No ?When did it last happen? Unk    ?If all above answers are "NO", may proceed with cephalosporin use. ?  ? Sulfa Antibiotics Hives and Swelling  ? ? ?History of Present Illness  ?  ? Stephanie Atkinson is a PMH of hypertension, AVB (beta-blocker and amiodarone stopped), permanent atrial fibrillation, coronary artery disease, atrial fibrillation with slow ventricular response, chronic diastolic CHF, de Quervain's tenosynovitis, and chronic kidney  disease stage III.  She is status post CABG x4 3/06.  She underwent cardiac catheterization with PCI and DES to her PDA 7/12.  She underwent follow-up cardiac catheterization 10/12 and again 8/14.  She was admitted 10/13/2012 with syncope.  She received a loop recorder 10/14/2012.  She was noted to be mildly dehydrated and had started new nitrate therapy. ? ?She had complaints of fatigue and was noted to have slow ventricular response.  She was  not on any chronotropic medications.  Due to her increased dyspnea on exertion and slow heart rate she underwent permanent transvenous pacemaker implantation by Dr. Sallyanne Kuster 07/16/2017.  Her symptoms clinically improved. ?  ?She was admitted with community-acquired pneumonia 4/21 for 5 days and was also noted to have diastolic heart failure.  She was treated with diuresis and IV antibiotics.  She followed up with Dr. Gwenlyn Found on 07/16/2019 and did not chest pain shortness of breath. ?  ?She was seen virtually 11/22/19 in follow-up and stated she felt well.  She stated that she did have some osteoarthritis in her right foot and knee.  She indicated that her right knee and foot did swell.  She monitored her blood pressure which she stated was up and down.  It was 113/80.  She continued to live independently, do all of her ADLs, laundry, and housework.  She stated that until COVID-19 she was walking several days per week.  She continued to walk but not as religiously due to the virus.  I planned follow-up with myself or Dr. Gwenlyn Found in 6 months and provide her with the salty 6 diet sheet. ?  ?She was seen by Dr. Sallyanne Kuster on 05/22/2020.  She was doing well at that time.  She reported that she had tripped on a curb and almost fallen.  She managed to catch her self.  She denied any bleeding issues.  She remained active.  Her pacemaker documented 1-1/2 hours of movement daily.  She denied lower extremity swelling, orthopnea, PND, exertional dyspnea and chest discomfort.  Her pacemaker interrogation showed normal device function.  At that time she was not pacemaker dependent.  Her underlying rhythm was atrial fibrillation with ventricular rate 45-50 bpm. ?  ?She presented to the clinic 06/27/2020 for follow up evaluation and stated  she felt well and continued to stay very physically active.  She reported that she even would exercise when she was sitting.  She reported that she had some ongoing right back pain.  She reported that she   had this for several years and had a back injury several years ago.  She reported some occasional tightness across her right lower back along with burning pain that shot down to her right buttocks.  She otherwise had no cardiac complaints.  We reviewed her emergency department note.  I recommended that she do some light stretching and agility type exercises for her low back, Voltaren/diclofenac topical cream and I will have her follow-up in 6 months. ? ?She was seen in follow-up by Dr. Gwenlyn Found 09/12/2020.  During that time she continued to do well.  She denied chest pain or shortness of breath.  She reported wearing lower extremity compression stockings.  She was noted to have mild peripheral edema.  Her only major complaint was right knee pain. ? ?She presents to the clinic today for follow-up evaluation states she feels well.  She does have some concerns about her medication.  Nebivolol and valsartan are still on her medication list, as well as spironolactone.  We reviewed her blood pressures at home.  She reports that her blood pressures at home are usually in the 130-140 range.  Today in the office is 140/64.  We reviewed her lab work from October which shows a slightly elevated creatinine.  We reviewed the importance of heart healthy low-sodium diet.  We reviewed her device battery life.  She remains very physically active walking and staying busy.  I will resume her valsartan at 80 mg and order a BMP in 1 week.  I will give her the salty 6 diet sheet and plan follow-up for 6 months. ?  ?Today she denies chest pain, shortness of breath, lower extremity edema, fatigue, palpitations, melena, hematuria, hemoptysis, diaphoresis, weakness, presyncope, syncope, orthopnea, and PND. ? ?Home Medications  ?  ?Prior to Admission medications   ?Medication Sig Start Date End Date Taking? Authorizing Provider  ?acetaminophen (TYLENOL) 500 MG tablet Take 1 tablet (500 mg total) by mouth every 6 (six) hours as needed for moderate  pain. 06/27/19   Alma Friendly, MD  ?amLODipine (NORVASC) 10 MG tablet TAKE 1 TABLET BY MOUTH EVERY DAY 03/27/21   Lorretta Harp, MD  ?atorvastatin (LIPITOR) 80 MG tablet Take 80 mg by mouth a

## 2021-05-21 NOTE — Patient Instructions (Signed)
Medication Instructions:  ?RESTART VALSARTAN 80MG  DAILY ? ?*If you need a refill on your cardiac medications before your next appointment, please call your pharmacy* ? ?Lab Work: ?BMET IN 1 WEEK (05-29-21) ?If you have labs (blood work) drawn today and your tests are completely normal, you will receive your results only by:  MyChart Message (if you have MyChart) OR A paper copy in the mail.  If you have any lab test that is abnormal or we need to change your treatment, we will call you to review the results. You may go to any [facility] that is convenient for you however, we do have a lab in our office that is able to assist you. You DO NOT need an appointment for our lab. The lab is open 8:00am and closes at 4:00pm. Lunch 12:45 - 1:45pm. ? ?Special Instructions ? ?PLEASE READ AND FOLLOW SALTY 6-ATTACHED-1,800 mg daily ? ?PLEASE MAINTAIN PHYSICAL ACTIVITY AS TOLERATED ? ?PLEASE PURCHASE AND WEAR COMPRESSION STOCKINGS DAILY AND TAKE OFF AT BEDTIME. Compression stockings are elastic socks that squeeze the legs. They help to increase blood flow to the legs and to decrease swelling in the legs from fluid retention, and reduce the chance of developing blood clots in the lower legs. Please put on in the AM when dressing and off at night when dressing for bed.  PLEASE LET THEM KNOW THAT YOU NEED KNEE HIGH'S WITH COMPRESSION OF 15-20 mmhg. ? ?[employer]  [address] 05-13-1985 ([phone]); [place], [place]; [phone]  EMAIL   [email or web address]. ? ?PLEASE MAKE SURE TO ELEVATE YOUR FEET & LEGS WHILE SITTING, THIS WILL HELP WITH THE SWELLING ALSO.  ? ?Follow-Up: ?Your next appointment:  6 month(s) In Person with [phone], MD    ? ?At [facility], you and your health needs are our priority.  As part of our continuing mission to provide you with exceptional heart care, we have created designated Provider Care Teams.  These Care Teams include your primary Cardiologist (physician) and Advanced  Practice Providers (APPs -  Physician Assistants and Nurse Practitioners) who all work together to provide you with the care you need, when you need it. ? ?We recommend signing up for the patient portal called "MyChart".  Sign up information is provided on this After Visit Summary.  MyChart is used to connect with patients for Virtual Visits (Telemedicine).  Patients are able to view lab/test results, encounter notes, upcoming appointments, etc.  Non-urgent messages can be sent to your provider as well.   ?To learn more about what you can do with MyChart, go to [facility].   ? ? ? ? ?  ? ? ? ? ?

## 2021-05-22 ENCOUNTER — Telehealth: Payer: Self-pay | Admitting: Cardiovascular Disease

## 2021-05-22 ENCOUNTER — Telehealth: Payer: Self-pay

## 2021-05-22 DIAGNOSIS — Z79899 Other long term (current) drug therapy: Secondary | ICD-10-CM

## 2021-05-22 DIAGNOSIS — I10 Essential (primary) hypertension: Secondary | ICD-10-CM

## 2021-05-22 NOTE — Telephone Encounter (Signed)
Faxed

## 2021-05-22 NOTE — Telephone Encounter (Signed)
Per daughter Rashid,Judy her PCP is Katie Tate,NP, "I can't believe you do not have this in your system". Informed that "in our system we just have [facility]. And they are not in our same system. We would like to get any lipid lab results from them. ?

## 2021-05-22 NOTE — Telephone Encounter (Signed)
Pt c/o medication issue: ? ?1. Name of Medication: spironolactone (ALDACTONE) 25 MG tablet  ? ?2. How are you currently taking this medication (dosage and times per day)? As directed ? ?3. Are you having a reaction (difficulty breathing--STAT)? no ? ?4. What is your medication issue? Daughter of the patient said the pharmacy would not fill this medication because it was discontinued. The daughter was not sure why it was discontinued. Please advise  ?

## 2021-05-22 NOTE — Telephone Encounter (Signed)
LM2CB, need to know pt's PCP at [facility]. ?

## 2021-05-22 NOTE — Telephone Encounter (Signed)
Returned call to daughter Janace Aris, she stated that are you returning a call? someone else already discussed with so this is already taken care of. Identified myself as Josem Kaufmann nurse and that per Verdon Cummins at appointment yesterday we were informed that pt was not taking the Spironolactone. ?She is very short with her answers and states that pt WAS taking the Spironolactone "to keep water off her heart, this was prescribed by Dr Allyson Sabal." Daughter states that "this is not true and they DID NOT say this and this is not and not an acceptable reason to not refill or take medication off a pt's list. Don't you think that it should be for a medical reason? Pt is taking this medication and we need a refill and a call back today". Please advise ?

## 2021-05-23 ENCOUNTER — Other Ambulatory Visit: Payer: Self-pay | Admitting: Nephrology

## 2021-05-23 DIAGNOSIS — N1832 Chronic kidney disease, stage 3b: Secondary | ICD-10-CM

## 2021-05-23 DIAGNOSIS — R7303 Prediabetes: Secondary | ICD-10-CM

## 2021-05-23 DIAGNOSIS — I5032 Chronic diastolic (congestive) heart failure: Secondary | ICD-10-CM

## 2021-05-23 DIAGNOSIS — I129 Hypertensive chronic kidney disease with stage 1 through stage 4 chronic kidney disease, or unspecified chronic kidney disease: Secondary | ICD-10-CM

## 2021-05-23 NOTE — Telephone Encounter (Signed)
It appears there is some confusion with this patient's medication.  During her appointment she reported that 3 of the medications on her medication list she was not taking.  Her daughter contacted the office and contradicts this.  Please have patient and her daughter present for pharmacy appointment to review medications/do med rec.  Thank you for your help. ? ?Thomasene Ripple. Gaynelle Pastrana NP-C ? ?  ?05/23/2021, 7:16 AM ?[facility] ?[address] ?Office [phone] Fax [phone] ? ?

## 2021-05-23 NOTE — Telephone Encounter (Signed)
Hello, we restarted her valsartan 80 mg on 05/21/2021.  Please have her present for follow-up lab work (BMP) and verify that she is taking the valsartan that we prescribed.  Thank you. ?

## 2021-05-24 NOTE — Telephone Encounter (Signed)
Sent mychart message requesting BP's since office visit. ?Scheduled Pharmd appt 06-11-21 @ 130pm, left detailed message for daughter. Also, sent mychart message ?

## 2021-05-24 NOTE — Telephone Encounter (Signed)
Pharmacist appt scheduled 06-11-21 at 130pm for medication management ?

## 2021-05-24 NOTE — Telephone Encounter (Signed)
Stephanie Atkinson, we received another phone message from the patient's daughter today.  Have you been able to schedule an appointment with pharmacy for her to review her medications.  It seems that there is some confusion with the patient and the patient's daughter.  Please contact her to review of medications. ?

## 2021-05-24 NOTE — Telephone Encounter (Signed)
LM2CB 

## 2021-05-24 NOTE — Telephone Encounter (Signed)
Spoke with daughter [name] to verify patient is taking valsartan 80 mg daily and to come for blood work next Monday. This was verified. Daughter asked if patient should still be on spironolactone 25 mg daily (ordered by Dr. Allyson Sabal)? She believes patient told nurse she was not taking spironolactone, but she is taking it. Was the medication discontinued or should patient be taking it? Please advise. ?

## 2021-05-28 ENCOUNTER — Ambulatory Visit: Payer: Medicare PPO | Admitting: General Practice

## 2021-05-28 DIAGNOSIS — I10 Essential (primary) hypertension: Secondary | ICD-10-CM | POA: Diagnosis not present

## 2021-05-28 DIAGNOSIS — Z79899 Other long term (current) drug therapy: Secondary | ICD-10-CM | POA: Diagnosis not present

## 2021-05-28 DIAGNOSIS — E7800 Pure hypercholesterolemia, unspecified: Secondary | ICD-10-CM | POA: Diagnosis not present

## 2021-05-28 DIAGNOSIS — I4891 Unspecified atrial fibrillation: Secondary | ICD-10-CM | POA: Diagnosis not present

## 2021-05-28 DIAGNOSIS — I25810 Atherosclerosis of coronary artery bypass graft(s) without angina pectoris: Secondary | ICD-10-CM | POA: Diagnosis not present

## 2021-05-29 LAB — BASIC METABOLIC PANEL
BUN/Creatinine Ratio: 11 — ABNORMAL LOW (ref 12–28)
BUN: 14 mg/dL (ref 10–36)
CO2: 22 mmol/L (ref 20–29)
Calcium: 11.1 mg/dL — ABNORMAL HIGH (ref 8.7–10.3)
Chloride: 105 mmol/L (ref 96–106)
Creatinine, Ser: 1.23 mg/dL — ABNORMAL HIGH (ref 0.57–1.00)
Glucose: 92 mg/dL (ref 70–99)
Potassium: 4.6 mmol/L (ref 3.5–5.2)
Sodium: 143 mmol/L (ref 134–144)
eGFR: 40 mL/min/{1.73_m2} — ABNORMAL LOW (ref >59)

## 2021-06-04 ENCOUNTER — Ambulatory Visit
Admission: RE | Admit: 2021-06-04 | Discharge: 2021-06-04 | Disposition: A | Discharge status: Home / Self Care | Payer: Medicare PPO | Source: Ambulatory Visit | Attending: Nephrology | Admitting: Nephrology

## 2021-06-04 DIAGNOSIS — N1832 Chronic kidney disease, stage 3b: Secondary | ICD-10-CM

## 2021-06-04 DIAGNOSIS — I129 Hypertensive chronic kidney disease with stage 1 through stage 4 chronic kidney disease, or unspecified chronic kidney disease: Secondary | ICD-10-CM

## 2021-06-04 DIAGNOSIS — I5032 Chronic diastolic (congestive) heart failure: Secondary | ICD-10-CM

## 2021-06-04 DIAGNOSIS — N281 Cyst of kidney, acquired: Secondary | ICD-10-CM | POA: Diagnosis not present

## 2021-06-04 DIAGNOSIS — R7303 Prediabetes: Secondary | ICD-10-CM

## 2021-06-04 DIAGNOSIS — N189 Chronic kidney disease, unspecified: Secondary | ICD-10-CM | POA: Diagnosis not present

## 2021-06-11 ENCOUNTER — Ambulatory Visit (INDEPENDENT_AMBULATORY_CARE_PROVIDER_SITE_OTHER): Payer: Medicare PPO | Admitting: Pharmacist Clinician (PhC)/ Clinical Pharmacy Specialist

## 2021-06-11 ENCOUNTER — Encounter: Payer: Self-pay | Admitting: Pharmacist Clinician (PhC)/ Clinical Pharmacy Specialist

## 2021-06-11 DIAGNOSIS — I10 Essential (primary) hypertension: Secondary | ICD-10-CM

## 2021-06-11 NOTE — Assessment & Plan Note (Signed)
Patient with essential hypertension, doing well with combination of valsartan, amlodipine and spironolactone.  Will set goal for < 140/80 due to patient advanced age.  She remains quite active for 89, and was encouraged to continue with activities.  Will have her switch amlodipine to evenings and continue valsartan and spironolactone in the mornings.  Asked that she monitor pressure at home a couple of times each week and let office know should the readings consistently be > 140 systolic.   ?

## 2021-06-11 NOTE — Patient Instructions (Signed)
?  Check your blood pressure at home occasionally and keep record of the readings. ? ?Take your BP meds as follows: ? Move amlodipine to evenings ? No other medication changes ? ?Bring all of your meds, your BP cuff and your record of home blood pressures to your next appointment.  Exercise as you?re able, try to walk approximately 30 minutes per day.  Keep salt intake to a minimum, especially watch canned and prepared boxed foods.  Eat more fresh fruits and vegetables and fewer canned items.  Avoid eating in fast food restaurants.  ? ? HOW TO TAKE YOUR BLOOD PRESSURE: ?Rest 5 minutes before taking your blood pressure. ? Don?t smoke or drink caffeinated beverages for at least 30 minutes before. ?Take your blood pressure before (not after) you eat. ?Sit comfortably with your back supported and both feet on the floor (don?t cross your legs). ?Elevate your arm to heart level on a table or a desk. ?Use the proper sized cuff. It should fit smoothly and snugly around your bare upper arm. There should be enough room to slip a fingertip under the cuff. The bottom edge of the cuff should be 1 inch above the crease of the elbow. ?Ideally, take 3 measurements at one sitting and record the average. ? ?

## 2021-06-11 NOTE — Progress Notes (Signed)
? ? ? ?06/11/2021 ?Stephanie Atkinson ?02-28-1924 ?[id number] ? ? ?HPI:  Stephanie Atkinson is a 88 y.o. female patient of Dr Allyson Sabal, with a PMH below who presents today for hypertension clinic evaluation.  Stephanie Atkinson was last seen by Edd Fabian NP in March, at which time her blood pressure was 140/64.  At that visit there was some discord between her medication list and what she was actually taking.  It was sorted and she was asked to resume valsartan 80 mg, and continue with amlodipine and spironolactone.   She was asked to follow up in a month and bring medications with her for verification.   ? ?Past Medical History: ?CAD CABG x 3 2006  ?hyperlipidemia LDL 58 on atorvastatin  ?AF CHADS2-VASc score 6, on Eliuqis  ?CKD Stage 3 4/23 GFR 40  ?hypothyroid on levothyroxine 50 mcg  ?  ? ?Blood Pressure Goal:  140/80 (age) ? ?Current Medications: amlodipine 10 mg qd, valsartan 80 mg qd, spironolactone 25 mg qd ? ?Family Hx: 7 children, 5 living - all with hypertension, mostly controlled ? ?Social Hx: no tobacco, no alcohol, decaf coffee ? ?Diet: mostly home cooked foods, some added salt, not much; vegetabes mosly frozen; dried beans for protein, beef, chicken fish.  ? ?Exercise: walks daily in the halls or outdoors when weather nice; grocery shops and goes to [employer] regularly ? ?Home BP readings:  ? ?Intolerances: no cardiac medication intolerances ? ?Labs: 05/28/21:  Na 143, K 4.6, Glu 92, BUN 14, SCr 1.23, GFR 40 ? ? ?Wt Readings from Last 3 Encounters:  ?06/11/21 140 lb (63.5 kg)  ?05/21/21 140 lb 3.2 oz (63.6 kg)  ?11/30/20 154 lb (69.9 kg)  ? ?BP Readings from Last 3 Encounters:  ?06/11/21 (!) 142/68  ?05/21/21 140/64  ?12/01/20 (!) 171/83  ? ?Pulse Readings from Last 3 Encounters:  ?06/11/21 70  ?05/21/21 74  ?12/01/20 76  ? ? ?Current Outpatient Medications  ?Medication Sig Dispense Refill  ? acetaminophen (TYLENOL) 500 MG tablet Take 1 tablet (500 mg total) by mouth every 6 (six) hours as needed for moderate pain. 30 tablet  0  ? amLODipine (NORVASC) 10 MG tablet TAKE 1 TABLET BY MOUTH EVERY DAY 90 tablet 3  ? atorvastatin (LIPITOR) 80 MG tablet Take 80 mg by mouth at bedtime.     ? benzonatate (TESSALON) 100 MG capsule Take 1 capsule by mouth every 8 (eight) hours for cough. 21 capsule 0  ? cyclobenzaprine (FLEXERIL) 10 MG tablet 1/2 - 1 tablet    ? diphenhydrAMINE (BENADRYL) 25 MG tablet Take 25 mg by mouth every 6 (six) hours as needed for allergies.    ? ELIQUIS 5 MG TABS tablet TAKE 1 TABLET BY MOUTH TWICE A DAY 180 tablet 1  ? fexofenadine (ALLEGRA) 180 MG tablet Take 180 mg by mouth daily.    ? furosemide (LASIX) 20 MG tablet TAKE 1 TABLET BY MOUTH EVERY DAY 90 tablet 3  ? hydroxypropyl methylcellulose / hypromellose (ISOPTO TEARS / GONIOVISC) 2.5 % ophthalmic solution Place 1 drop into both eyes 3 (three) times daily as needed for dry eyes.    ? levothyroxine (SYNTHROID, LEVOTHROID) 50 MCG tablet Take 50 mcg by mouth daily before breakfast.     ? nitroGLYCERIN (NITROSTAT) 0.4 MG SL tablet Place 1 tablet (0.4 mg total) under the tongue every 5 (five) minutes as needed for chest pain. 25 tablet 6  ? spironolactone (ALDACTONE) 25 MG tablet Take 25 mg by mouth daily.    ?  traMADol (ULTRAM) 50 MG tablet Take 50 mg by mouth every 6 (six) hours as needed (for pain).     ? valsartan (DIOVAN) 80 MG tablet Take 1 tablet (80 mg total) by mouth daily. 30 tablet 6  ? ?No current facility-administered medications for this visit.  ? ? ?Allergies  ?Allergen Reactions  ? Penicillins Hives  ?  Did it involve swelling of the face/tongue/throat, SOB, or low BP? No ?Did it involve sudden or severe rash/hives, skin peeling, or any reaction on the inside of your mouth or nose? No ?Did you need to seek medical attention at a hospital or doctor's office? No ?When did it last happen? Unk    ?If all above answers are "NO", may proceed with cephalosporin use. ?  ? Sulfa Antibiotics Hives and Swelling  ? ? ?Past Medical History:  ?Diagnosis Date  ? Chronic  kidney disease, stage 3 (HCC)   ? Coronary artery disease   ? Hx of CABG 2006  ? LIMA-LAD, free RIMA-PDA, Radial-OM1-OM2  ? Hyperlipidemia   ? Hypertension   ? Hypothyroid   ? MI, old   ?    ? PAF (paroxysmal atrial fibrillation) (HCC)   ? was on amio, d/c'd due to prolonged PR interval, rate control  ? Presence of permanent cardiac pacemaker 07/16/2017  ? Presence of stent in right coronary artery 07/12  ? RIMA-PDA occluded, DES stent placed  ? Syncope 07/12  ? bradycardic, beta blocker decreased, also felt to be dehydrated  ? ? ?Blood pressure (!) 142/68, pulse 70, resp. rate 15, height 5\' 3"  (1.6 m), weight 140 lb (63.5 kg), SpO2 98 %. ? ?Hypertension ?Patient with essential hypertension, doing well with combination of valsartan, amlodipine and spironolactone.  Will set goal for < 140/80 due to patient advanced age.  She remains quite active for 89, and was encouraged to continue with activities.  Will have her switch amlodipine to evenings and continue valsartan and spironolactone in the mornings.  Asked that she monitor pressure at home a couple of times each week and let office know should the readings consistently be > 140 systolic.   ? ? ? PharmD CPP [facility] ?[facility] ?[address] ?[place], [place]  [place] ?[phone] ? ? ? ? ? ? ? ? ? ? ? ? ? ? ? ? ? ? ? ? ? ? ? ? ? ? ? ? ? ? ? ? ? ? ? ? ? ? ? ? ? ? ? ? ? ? ? ? ? ? ? ? ? ? ? ? ? ? ? ? ? ? ? ? ? ? ? ? ? ? ? ? ? ? ? ? ? ? ? ? ? ? ? ? ? ? ? ? ? ? ? ? ? ? ? ? ? ? ? ? ? ? ? ? ? ? ? ? ? ? ? ? ? ? ? ? ? ? ? ? ? ? ? ? ? ? ? ? ? ? ? ? ? ? ? ? ? ? ? ? ? ? ? ? ? ? ? ? ? ? ? ? ? ? ? ? ? ? ? ? ? ? ? ? ? ? ? ? ? ? ? ? ? ? ? ? ? ? ? ? ? ? ? ? ? ? ? ? ? ? ? ? ? ? ? ? ? ? ? ? ? ? ? ? ? ? ? ? ? ? ? ? ? ? ? ? ? ? ? ? ? ? ? ? ? ? ? ? ? ? ? ? ? ? ? ? ? ? ? ? ? ? ? ? ? ? ? ? ? ? ? ? ? ? ? ? ? ? ? ? ? ? ? ? ? ? ? ? ? ? ? ? ? ? ? ? ? ? ? ? ? ? ? ? ? ? ? ? ? ? ? ? ? ? ? ? ? ? ? ? ? ? ? ? ? ? ? ? ? ? ? ? ? ? ? ? ? ? ? ? ? ? ? ? ? ? ? ? ? ? ? ? ? ? ? ? ? ? ? ? ? ? ? ? ? ? ? ? ? ? ? ?

## 2021-06-12 NOTE — Telephone Encounter (Signed)
Pt arrived at appt 4-17 ?

## 2021-06-19 DIAGNOSIS — E039 Hypothyroidism, unspecified: Secondary | ICD-10-CM | POA: Diagnosis not present

## 2021-06-19 DIAGNOSIS — E785 Hyperlipidemia, unspecified: Secondary | ICD-10-CM | POA: Diagnosis not present

## 2021-06-19 DIAGNOSIS — N1832 Chronic kidney disease, stage 3b: Secondary | ICD-10-CM | POA: Diagnosis not present

## 2021-06-19 DIAGNOSIS — N390 Urinary tract infection, site not specified: Secondary | ICD-10-CM | POA: Diagnosis not present

## 2021-06-19 DIAGNOSIS — R7303 Prediabetes: Secondary | ICD-10-CM | POA: Diagnosis not present

## 2021-06-19 DIAGNOSIS — E559 Vitamin D deficiency, unspecified: Secondary | ICD-10-CM | POA: Diagnosis not present

## 2021-06-25 DIAGNOSIS — G8929 Other chronic pain: Secondary | ICD-10-CM | POA: Diagnosis not present

## 2021-06-25 DIAGNOSIS — N1832 Chronic kidney disease, stage 3b: Secondary | ICD-10-CM | POA: Diagnosis not present

## 2021-06-25 DIAGNOSIS — F5101 Primary insomnia: Secondary | ICD-10-CM | POA: Diagnosis not present

## 2021-06-25 DIAGNOSIS — Z23 Encounter for immunization: Secondary | ICD-10-CM | POA: Diagnosis not present

## 2021-06-25 DIAGNOSIS — R7303 Prediabetes: Secondary | ICD-10-CM | POA: Diagnosis not present

## 2021-06-25 DIAGNOSIS — I480 Paroxysmal atrial fibrillation: Secondary | ICD-10-CM | POA: Diagnosis not present

## 2021-06-25 DIAGNOSIS — F419 Anxiety disorder, unspecified: Secondary | ICD-10-CM | POA: Diagnosis not present

## 2021-06-25 DIAGNOSIS — R600 Localized edema: Secondary | ICD-10-CM | POA: Diagnosis not present

## 2021-06-25 DIAGNOSIS — I5032 Chronic diastolic (congestive) heart failure: Secondary | ICD-10-CM | POA: Diagnosis not present

## 2021-07-02 ENCOUNTER — Other Ambulatory Visit: Payer: Self-pay | Admitting: Cardiovascular Disease

## 2021-07-03 NOTE — Telephone Encounter (Signed)
Prescription refill request for Eliquis received. Indication: Afib  Last office visit: 05/21/21 ([place])  Scr: 1.23 (05/28/21) Age: 88 Weight: 63.5kg  Appropriate dose and refill sent to requested pharmacy.    

## 2021-07-13 ENCOUNTER — Ambulatory Visit (INDEPENDENT_AMBULATORY_CARE_PROVIDER_SITE_OTHER): Payer: Medicare PPO

## 2021-07-13 DIAGNOSIS — I4821 Permanent atrial fibrillation: Secondary | ICD-10-CM

## 2021-07-16 DIAGNOSIS — H04123 Dry eye syndrome of bilateral lacrimal glands: Secondary | ICD-10-CM | POA: Diagnosis not present

## 2021-07-17 LAB — CUP PACEART REMOTE DEVICE CHECK
Battery Remaining Longevity: 95 mo
Battery Voltage: 2.99 V
Brady Statistic RV Percent Paced: 99.29 %
Date Time Interrogation Session: 20230521214047
Implantable Lead Implant Date: 20190522
Implantable Lead Location: 753860
Implantable Lead Model: 5076
Implantable Pulse Generator Implant Date: 20190522
Lead Channel Impedance Value: 323 Ohm
Lead Channel Impedance Value: 418 Ohm
Lead Channel Pacing Threshold Amplitude: 0.625 V
Lead Channel Pacing Threshold Pulse Width: 0.4 ms
Lead Channel Sensing Intrinsic Amplitude: 8.25 mV
Lead Channel Sensing Intrinsic Amplitude: 8.25 mV
Lead Channel Setting Pacing Amplitude: 2.5 V
Lead Channel Setting Pacing Pulse Width: 0.4 ms
Lead Channel Setting Sensing Sensitivity: 1.2 mV

## 2021-07-19 NOTE — Progress Notes (Signed)
Remote pacemaker transmission.   

## 2021-08-09 DIAGNOSIS — M5441 Lumbago with sciatica, right side: Secondary | ICD-10-CM | POA: Diagnosis not present

## 2021-08-09 DIAGNOSIS — M5442 Lumbago with sciatica, left side: Secondary | ICD-10-CM | POA: Diagnosis not present

## 2021-08-09 DIAGNOSIS — M25511 Pain in right shoulder: Secondary | ICD-10-CM | POA: Diagnosis not present

## 2021-09-12 ENCOUNTER — Other Ambulatory Visit: Payer: Self-pay | Admitting: Cardiovascular Disease

## 2021-09-12 ENCOUNTER — Other Ambulatory Visit: Payer: Self-pay | Admitting: General Practice

## 2021-09-12 DIAGNOSIS — I4821 Permanent atrial fibrillation: Secondary | ICD-10-CM

## 2021-09-12 NOTE — Telephone Encounter (Signed)
Prescription refill request for Eliquis received. Indication: Afib  Last office visit: 05/21/21 [place])  Scr: 1.23 (05/28/21) Age: 88 Weight: 63.5kg  Appropriate dose and refill sent to requested pharmacy.

## 2021-10-01 ENCOUNTER — Encounter (HOSPITAL_COMMUNITY): Payer: Self-pay | Admitting: Emergency Medicine

## 2021-10-01 ENCOUNTER — Emergency Department (HOSPITAL_COMMUNITY): Payer: Medicare PPO

## 2021-10-01 ENCOUNTER — Emergency Department (HOSPITAL_COMMUNITY)
Admission: EM | Admit: 2021-10-01 | Discharge: 2021-10-01 | Disposition: A | Discharge status: Home / Self Care | Payer: Medicare PPO | Attending: Emergency Medicine | Admitting: Emergency Medicine

## 2021-10-01 DIAGNOSIS — N183 Chronic kidney disease, stage 3 unspecified: Secondary | ICD-10-CM | POA: Diagnosis not present

## 2021-10-01 DIAGNOSIS — I517 Cardiomegaly: Secondary | ICD-10-CM | POA: Diagnosis not present

## 2021-10-01 DIAGNOSIS — Z951 Presence of aortocoronary bypass graft: Secondary | ICD-10-CM | POA: Insufficient documentation

## 2021-10-01 DIAGNOSIS — Z7901 Long term (current) use of anticoagulants: Secondary | ICD-10-CM | POA: Diagnosis not present

## 2021-10-01 DIAGNOSIS — I2510 Atherosclerotic heart disease of native coronary artery without angina pectoris: Secondary | ICD-10-CM | POA: Insufficient documentation

## 2021-10-01 DIAGNOSIS — Z79899 Other long term (current) drug therapy: Secondary | ICD-10-CM | POA: Diagnosis not present

## 2021-10-01 DIAGNOSIS — I480 Paroxysmal atrial fibrillation: Secondary | ICD-10-CM | POA: Insufficient documentation

## 2021-10-01 DIAGNOSIS — M549 Dorsalgia, unspecified: Secondary | ICD-10-CM | POA: Diagnosis not present

## 2021-10-01 DIAGNOSIS — M546 Pain in thoracic spine: Secondary | ICD-10-CM

## 2021-10-01 DIAGNOSIS — Z950 Presence of cardiac pacemaker: Secondary | ICD-10-CM | POA: Insufficient documentation

## 2021-10-01 DIAGNOSIS — I129 Hypertensive chronic kidney disease with stage 1 through stage 4 chronic kidney disease, or unspecified chronic kidney disease: Secondary | ICD-10-CM | POA: Diagnosis not present

## 2021-10-01 DIAGNOSIS — M8588 Other specified disorders of bone density and structure, other site: Secondary | ICD-10-CM | POA: Diagnosis not present

## 2021-10-01 DIAGNOSIS — M545 Low back pain, unspecified: Secondary | ICD-10-CM | POA: Diagnosis not present

## 2021-10-01 DIAGNOSIS — M4126 Other idiopathic scoliosis, lumbar region: Secondary | ICD-10-CM | POA: Diagnosis not present

## 2021-10-01 LAB — URINALYSIS, ROUTINE W REFLEX MICROSCOPIC
Bilirubin Urine: NEGATIVE
Glucose, UA: NEGATIVE mg/dL
Hgb urine dipstick: NEGATIVE
Ketones, ur: NEGATIVE mg/dL
Nitrite: NEGATIVE
Protein, ur: NEGATIVE mg/dL
Specific Gravity, Urine: 1.004 — ABNORMAL LOW (ref 1.005–1.030)
pH: 5 (ref 5.0–8.0)

## 2021-10-01 MED ORDER — LIDOCAINE 5 % EX PTCH: 1.0000 | MEDICATED_PATCH | CUTANEOUS | 0 refills | Status: DC

## 2021-10-01 MED ORDER — LIDOCAINE 5 % EX PTCH
1.0000 | MEDICATED_PATCH | CUTANEOUS | Status: DC
  Administered 2021-10-01: 1 via TRANSDERMAL
  Filled 2021-10-01: qty 1

## 2021-10-01 NOTE — ED Provider Notes (Signed)
[facility] DEPT Provider Note   CSN: [id number] Arrival date & time: 10/01/21  1418     History  Chief Complaint  Patient presents with   Back Pain    Stephanie Atkinson is a 88 y.o. female with history of hypertension, hyperlipidemia, paroxysmal atrial fibrillation, CKD stage III, CAD status post CABG 2006, presence of permanent cardiac pacemaker, lumbar go with sciatica right side, pain in right shoulder.  Presents to the emergency department today with a complaint of thoracic back pain  Patient reports that thoracic back pain started approximately 1 week ago.  Pain is located to her right thoracic back.  Patient reports that pain radiates throughout her back and into bilateral legs.  Patient states that she has had similar pain in the past however this pain is worse.  Patient states that pain became more intense today which stops her from doing her daily activities of life such as doing laundry.  Patient denies any recent falls or traumatic injuries.  Patient endorses urinary frequency however states that she is on diuretics which caused her to urinate frequently.  Patient denies any numbness, weakness, saddle anesthesia, bowel/bladder dysfunction, dysuria, hematuria, urinary urgency, vaginal pain, vaginal bleeding, vaginal discharge, fevers, chills, history of malignancy, IV drug use, chronic steroid use   Back Pain Associated symptoms: no abdominal pain, no chest pain, no dysuria, no fever and no headaches        Home Medications Prior to Admission medications   Medication Sig Start Date End Date Taking? Authorizing Provider  acetaminophen (TYLENOL) 500 MG tablet Take 1 tablet (500 mg total) by mouth every 6 (six) hours as needed for moderate pain. 06/27/19   Alma Friendly, MD  amLODipine (NORVASC) 10 MG tablet TAKE 1 TABLET BY MOUTH EVERY DAY 03/27/21   Lorretta Harp, MD  apixaban (ELIQUIS) 5 MG TABS tablet TAKE 1 TABLET BY MOUTH TWICE A DAY  09/12/21   Lorretta Harp, MD  atorvastatin (LIPITOR) 80 MG tablet Take 80 mg by mouth at bedtime.     [provider]  benzonatate (TESSALON) 100 MG capsule Take 1 capsule by mouth every 8 (eight) hours for cough. 11/06/20   Vanessa Kick, MD  cyclobenzaprine (FLEXERIL) 10 MG tablet 1/2 - 1 tablet 05/30/16   [provider]  diphenhydrAMINE (BENADRYL) 25 MG tablet Take 25 mg by mouth every 6 (six) hours as needed for allergies.    [provider]  fexofenadine (ALLEGRA) 180 MG tablet Take 180 mg by mouth daily.    [provider]  furosemide (LASIX) 20 MG tablet TAKE 1 TABLET BY MOUTH EVERY DAY 02/12/21   Lorretta Harp, MD  hydroxypropyl methylcellulose / hypromellose (ISOPTO TEARS / GONIOVISC) 2.5 % ophthalmic solution Place 1 drop into both eyes 3 (three) times daily as needed for dry eyes.    [provider]  levothyroxine (SYNTHROID, LEVOTHROID) 50 MCG tablet Take 50 mcg by mouth daily before breakfast.     [provider]  nitroGLYCERIN (NITROSTAT) 0.4 MG SL tablet Place 1 tablet (0.4 mg total) under the tongue every 5 (five) minutes as needed for chest pain. 05/07/21   Lorretta Harp, MD  spironolactone (ALDACTONE) 25 MG tablet TAKE 1 TABLET (25 MG TOTAL) BY MOUTH DAILY. 07/03/21   Lorretta Harp, MD  traMADol (ULTRAM) 50 MG tablet Take 50 mg by mouth every 6 (six) hours as needed (for pain).     [provider]  valsartan (DIOVAN) 80  MG tablet Take 1 tablet (80 mg total) by mouth daily. 09/12/21   Lorretta Harp, MD      Allergies    Penicillins and Sulfa antibiotics    Review of Systems   Review of Systems  Constitutional:  Negative for chills and fever.  Eyes:  Negative for visual disturbance.  Respiratory:  Negative for shortness of breath.   Cardiovascular:  Negative for chest pain.  Gastrointestinal:  Negative for abdominal pain, nausea and vomiting.  Genitourinary:  Positive for frequency. Negative for  decreased urine volume, difficulty urinating, dysuria, flank pain, hematuria, urgency, vaginal bleeding, vaginal discharge and vaginal pain.  Musculoskeletal:  Positive for back pain and myalgias. Negative for neck pain.  Skin:  Negative for color change and rash.  Neurological:  Negative for dizziness, syncope, light-headedness and headaches.  Psychiatric/Behavioral:  Negative for confusion.     Physical Exam Updated Vital Signs BP (!) 150/68 (BP Location: Right Arm)   Pulse 69   Temp 98.2 F (36.8 C) (Oral)   Resp 14   Ht 5\' 3"  (1.6 m)   Wt 63.5 kg   SpO2 100%   BMI 24.80 kg/m  Physical Exam Vitals and nursing note reviewed.  Constitutional:      General: She is not in acute distress.    Appearance: She is not ill-appearing, toxic-appearing or diaphoretic.  HENT:     Head: Normocephalic.  Eyes:     General: No scleral icterus.       Right eye: No discharge.        Left eye: No discharge.  Cardiovascular:     Rate and Rhythm: Normal rate.  Pulmonary:     Effort: Pulmonary effort is normal.  Abdominal:     General: Abdomen is flat. There is no distension. There are no signs of injury.     Palpations: Abdomen is soft. There is no mass or pulsatile mass.     Tenderness: There is no abdominal tenderness. There is no guarding or rebound.     Hernia: There is no hernia in the umbilical area or ventral area.  Musculoskeletal:       Arms:     Cervical back: No swelling, edema, deformity, erythema, signs of trauma, lacerations, rigidity, spasms, torticollis, tenderness, bony tenderness or crepitus. No pain with movement. Normal range of motion.     Thoracic back: Tenderness present. No swelling, edema, deformity, signs of trauma, lacerations, spasms or bony tenderness.     Lumbar back: No swelling, edema, deformity, signs of trauma, lacerations, spasms, tenderness or bony tenderness.     Comments: No midline tenderness or deformity to cervical, thoracic, lumbar spine.  Patient  has pinpoint tenderness to right thoracic back as indicated above.  Skin:    General: Skin is warm and dry.  Neurological:     General: No focal deficit present.     Mental Status: She is alert.     GCS: GCS eye subscore is 4. GCS verbal subscore is 5. GCS motor subscore is 6.     Comments: Sensation to touch grossly intact to bilateral upper and lower extremities.  +5 strength to bilateral upper and lower extremities.  Psychiatric:        Behavior: Behavior is cooperative.     ED Results / Procedures / Treatments   Labs (all labs ordered are listed, but only abnormal results are displayed) Labs Reviewed  URINALYSIS, ROUTINE W REFLEX MICROSCOPIC - Abnormal; Notable for the following components:  Result Value   Color, Urine COLORLESS (*)    Specific Gravity, Urine 1.004 (*)    Leukocytes,Ua TRACE (*)    Bacteria, UA RARE (*)    All other components within normal limits  URINE CULTURE    EKG None  Radiology DG Ribs Unilateral W/Chest Right  Result Date: 10/01/2021 CLINICAL DATA:  Back pain. EXAM: RIGHT RIBS AND CHEST - 3+ VIEW COMPARISON:  November 30, 2020. FINDINGS: No fracture or other bone lesions are seen involving the ribs. There is no evidence of pneumothorax or pleural effusion. Both lungs are clear. Stable cardiomegaly. Status post coronary bypass graft. Single lead left-sided pacemaker is again noted. IMPRESSION: No acute abnormality is noted.  Right ribs are unremarkable. Electronically Signed   By: Lupita Raider M.D.   On: 10/01/2021 15:21   DG Lumbar Spine Complete  Result Date: 10/01/2021 CLINICAL DATA:  Back pain and bilateral lower extremity pain. EXAM: LUMBAR SPINE - COMPLETE 4+ VIEW COMPARISON:  September 28, 2020. FINDINGS: No fracture or spondylolisthesis is noted. Mild dextroscoliosis of the lumbar spine is noted. Osteopenia is noted. Mild degenerative disc disease is noted at L1-2. IMPRESSION: Mild degenerative disc disease is noted at L1-2. No acute abnormality  is noted. Electronically Signed   By: Lupita Raider M.D.   On: 10/01/2021 15:18    Procedures Procedures    Medications Ordered in ED Medications  lidocaine (LIDODERM) 5 % 1 patch (1 patch Transdermal Patch Applied 10/01/21 1722)    ED Course/ Medical Decision Making/ A&P                           Medical Decision Making Amount and/or Complexity of Data Reviewed Labs: ordered.  Risk Prescription drug management.   Alert 88 year old female no acute distress, nontoxic-appearing.  Presents to the ED with complaint of thoracic back pain.    Information was obtained from patient and patient's daughter at bedside.  Past medical records were reviewed including previous prior notes, labs, and imaging.  Patient has medical history as outlined in HPI which complicates her care  Patient has no focal neurological deficit.  Denies any numbness, weakness, saddle anesthesia, bowel/bladder dysfunction.  Low suspicion for cauda equina syndrome at this time.  Patient denies any fever, chills, IV drug use; low suspicion for epidural abscess at this time.  While patient has not had any falls or traumatic injuries will obtain x-ray imaging of right ribs and lumbar spine to evaluate for acute osseous abnormality.  Additionally due to location of pain will check urinalysis to look for possible UTI/pyelonephritis.  Patient given lidocaine patch for pain relief.  I personally viewed and interpret patient's x-ray imaging.  Imaging shows no acute right rib fracture.  Mild degenerative disc disease noted at L1-L2, no acute abnormality to lumbar spine.  I personally viewed interpret patient's urinalysis.  Urinalysis shows no signs of infection.  Patient reports improvement in pain after receiving lidocaine patch.  Patient is able to stand and ambulate without difficulty or complaints of pain.  We will discharge patient to follow-up with her orthopedic provider at this time.  Discussed tricked return precautions  with patient and patient's daughter at bedside.  Patient care discussed with attending physician Dr. Rubin Payor  Based on patient's chief complaint, I considered admission might be necessary, however after reassuring ED workup feel patient is reasonable for discharge.  Discussed results, findings, treatment and follow up. Patient advised of return precautions. Patient  verbalized understanding and agreed with plan.  Portions of this note were generated with Scientist, clinical (histocompatibility and immunogenetics). Dictation errors may occur despite best attempts at proofreading.         Final Clinical Impression(s) / ED Diagnoses Final diagnoses:  None    Rx / DC Orders ED Discharge Orders     None         Berneice Heinrich 10/01/21 1826    Benjiman Core, MD 10/01/21 2352

## 2021-10-01 NOTE — ED Provider Triage Note (Signed)
Emergency Medicine Provider Triage Evaluation Note  Stephanie Atkinson , a 88 y.o. female  was evaluated in triage.  Pt complains of right-sided mid back pain as well as pain into her bilateral lower extremities.  Patient typically can ambulate well but has had difficulty recently due to pain.  She has a history of back pain, however this is different in the sense that she has pain down into her bilateral lower legs.  No swelling.  No abdominal pain.  She has taken a muscle relaxer and tramadol without improvement.  Review of Systems  Positive: Leg pain, back pain Negative: Abdominal pain  Physical Exam  BP (!) 150/68 (BP Location: Right Arm)   Pulse 69   Temp 98.2 F (36.8 C) (Oral)   Resp 14   Ht 5\' 3"  (1.6 m)   Wt 63.5 kg   SpO2 100%   BMI 24.80 kg/m  Gen:   Awake, no distress   Resp:  Normal effort  MSK:   Moves extremities without difficulty  Other:  No midline spinal tenderness.  Patient has tenderness over the right inferior posterior ribs  Medical Decision Making  Medically screening exam initiated at 2:31 PM.  Appropriate orders placed.  Stephanie Atkinson was informed that the remainder of the evaluation will be completed by another provider, this initial triage assessment does not replace that evaluation, and the importance of remaining in the ED until their evaluation is complete.     Caryl Asp, PA-C 10/01/21 1445

## 2021-10-01 NOTE — ED Triage Notes (Signed)
Pt arrives via EMS from home with right sided thoracic pain x5 days, denies any falls. PCP was unable to see pt today. Pain only with movement and radiates down both legs.

## 2021-10-01 NOTE — Discharge Instructions (Signed)
[facility] to be evaluated for your back pain.  The x-ray imaging obtained did not show any acute fractures or dislocations.  Your urine sample did not show any signs of infection.  Please follow-up with your orthopedic provider for further management of your back pain.  Get help right away if: You develop new bowel or bladder control problems. You have unusual weakness or numbness in your arms or legs. You feel faint.

## 2021-10-02 ENCOUNTER — Encounter: Payer: Self-pay | Admitting: Surgery

## 2021-10-02 ENCOUNTER — Ambulatory Visit: Payer: Medicare PPO | Admitting: Surgery

## 2021-10-02 DIAGNOSIS — M4726 Other spondylosis with radiculopathy, lumbar region: Secondary | ICD-10-CM

## 2021-10-02 DIAGNOSIS — M7061 Trochanteric bursitis, right hip: Secondary | ICD-10-CM

## 2021-10-02 NOTE — Progress Notes (Signed)
Office Visit Note   Patient: Stephanie Atkinson           Date of Birth: [birth year]           MRN: [id number] Visit Date: 10/02/2021              Requested by: Linus Galas, NP [address] [place],  [place] [address] PCP: Linus Galas, NP   Assessment & Plan: Visit Diagnoses:  1. Other spondylosis with radiculopathy, lumbar region   2. Greater trochanteric bursitis, right     Plan: In hopes of giving patient some improvement of her right hip pain offered injection.  Patient consent right lateral hip was prepped with Betadine and greater trochanter bursa Marcaine/Depo-Medrol injection was performed.  After sitting for few minutes she did have good relief with anesthetic in place.  Follow-up in the office as needed.  Return if there are any further issues.  Follow-Up Instructions: Return if symptoms worsen or fail to improve.   Orders:  Orders Placed This Encounter  Procedures   Large Joint Inj   No orders of the defined types were placed in this encounter.     Procedures: Large Joint Inj: R greater trochanter on 10/02/2021 3:58 PM Indications: pain Details: 22 G 1.5 in needle, lateral approach Medications: 3 mL lidocaine 1 %; 6 mL bupivacaine 0.5 %; 80 mg methylPREDNISolone acetate 40 MG/ML Outcome: tolerated well, no immediate complications Consent was given by the patient. Patient was prepped and draped in the usual sterile fashion.      Clinical Data: No additional findings.   Subjective: Chief Complaint  Patient presents with   Lower Back - Pain     88 year old patient comes in today low back pain and right lateral hip pain.  Patient has known history of multilevel lumbar spondylosis.  Pain has been increased x1 week and localizes most of her problem to the right lateral hip.  Pain when she is ambulating and lays on her right side.  Has not had any recent injury.  Review of Systems No current complaints of cardiopulmonary GI/GU  issues  Objective: Vital Signs: There were no vitals taken for this visit.  Physical Exam Constitutional:      Comments: Very pleasant elderly female in no acute distress.  HENT:     Head: Normocephalic and atraumatic.     Nose: Nose normal.  Pulmonary:     Effort: No respiratory distress.  Musculoskeletal:     Comments: Gait is somewhat antalgic.  Mild lumbar paraspinal tenderness.  She has moderate to marked tenderness over the right hip greater trochanter bursa.  Negative logroll bilateral hips.  Negative straight leg raise.  Neurological:     Mental Status: She is alert.     Ortho Exam  Specialty Comments:  No specialty comments available.  Imaging: No results found.   PMFS History: Patient Active Problem List   Diagnosis Date Noted   Venous stasis dermatitis of both lower extremities 01/06/2020   Unilateral primary osteoarthritis, right knee 01/06/2020   Community acquired bacterial pneumonia 06/23/2019   Acute kidney injury superimposed on chronic kidney disease (HCC) 06/23/2019   Synovitis of right knee 03/02/2019   Chronic diastolic heart failure (HCC) 09/14/2018   Pacemaker 07/16/2017   Atrial fibrillation with slow ventricular response (HCC) 06/27/2017   Hypercholesterolemia 06/27/2017   Symptomatic bradycardia 06/27/2017   Chronic pain of right knee 05/17/2016   De Quervain's tenosynovitis 05/17/2016   CAD (coronary artery disease) of artery bypass  graft 02/13/2015   Encounter for loop recorder check 02/01/2014   Accelerated junctional rhythm 05/04/2013   Dehydration 10/14/2012   Syncope and collapse 10/13/2012   History of first degree atrioventricular block 10/11/2012   Long term (current) use of anticoagulants 10/06/2012   AVB - beta blocker and Amiodarone stopped 07/10/2012   Hypertension    Hx of CABG X 4 3/06-     Dyslipidemia    RCA DES placed 7/12- patent 8/14    Permanent atrial fibrillation (HCC)    Chronic kidney disease, stage 3 (HCC)     Past Medical History:  Diagnosis Date   Chronic kidney disease, stage 3 (HCC)    Coronary artery disease    Hx of CABG 2006   LIMA-LAD, free RIMA-PDA, Radial-OM1-OM2   Hyperlipidemia    Hypertension    Hypothyroid    MI, old        PAF (paroxysmal atrial fibrillation) (HCC)    was on amio, d/c'd due to prolonged PR interval, rate control   Presence of permanent cardiac pacemaker 07/16/2017   Presence of stent in right coronary artery 07/12   RIMA-PDA occluded, DES stent placed   Syncope 07/12   bradycardic, beta blocker decreased, also felt to be dehydrated    Family History  Problem Relation Age of Onset   CAD Mother    CAD Maternal Grandmother     Past Surgical History:  Procedure Laterality Date   CARDIAC CATHETERIZATION  [phone]   dr. Onalee Hua harding, revealing a atretic bypass to her right with patent distal RCA stent   CAROTID STENT  2012   CORONARY ARTERY BYPASS GRAFT  2006   LIMA-LAD, free RIMA-PDA, Radial-OM1-OM2   DOPPLER ECHOCARDIOGRAPHY  [id number]   mild asymmetric left ventricular hypertrophy, left ventricular systolic function is low normal, ejection fraction = 50-55%, the LA is moderate dilated, the RA is mildly dilated, no significant valvular disease   INSERT / REPLACE / REMOVE PACEMAKER  07/16/2017   JOINT REPLACEMENT     hip replacement   LEFT HEART CATHETERIZATION WITH CORONARY/GRAFT ANGIOGRAM N/A 10/12/2012   Procedure: LEFT HEART CATHETERIZATION WITH Isabel Caprice;  Surgeon: Marykay Lex, MD;  Location: [facility];  Service: Cardiovascular;  Laterality: N/A;   LOOP RECORDER IMPLANT N/A 10/15/2012   Procedure: LOOP RECORDER IMPLANT;  Surgeon: Thurmon Fair, MD;  Location: [facility] CATH LAB;  Service: Cardiovascular;  Laterality: N/A;   LOOP RECORDER REMOVAL  07/16/2017   LOOP RECORDER REMOVAL N/A 07/16/2017   Procedure: LOOP RECORDER REMOVAL;  Surgeon: Thurmon Fair, MD;  Location: [facility] INVASIVE CV LAB;  Service: Cardiovascular;  Laterality: N/A;    NM MYOVIEW LTD  [id number]   post stress left ventricle is normal in size, post stress ejection fraction is 67% global left ventricular systolic function is normal, normal myocardial perfusion study, abnormal myocardial perfusion study, low risk scan   PACEMAKER IMPLANT N/A 07/16/2017   Procedure: PACEMAKER IMPLANT;  Surgeon: Thurmon Fair, MD;  Location: [facility] INVASIVE CV LAB;  Service: Cardiovascular;  Laterality: N/A;   THYROIDECTOMY     Social History   Occupational History   Not on file  Tobacco Use   Smoking status: Never   Smokeless tobacco: Current    Types: Snuff  Vaping Use   Vaping Use: Never used  Substance and Sexual Activity   Alcohol use: No   Drug use: No   Sexual activity: Not on file

## 2021-10-04 LAB — URINE CULTURE: Culture: 50000 — AB

## 2021-10-05 ENCOUNTER — Telehealth: Payer: Self-pay

## 2021-10-05 NOTE — Telephone Encounter (Signed)
Post ED Visit - Positive Culture Follow-up  Culture report reviewed by antimicrobial stewardship pharmacist: [facility] []  , Pharm.D. []  Enzo Bi, Pharm.D., BCPS AQ-ID []  , Pharm.D., BCPS []  Celedonio Miyamoto, Pharm.D., BCPS []  [place], Garvin Fila.D., BCPS, AAHIVP []  , Pharm.D., BCPS, AAHIVP []  Georgina Pillion, PharmD, BCPS []  , PharmD, BCPS []  [place], PharmD, BCPS []  [address], PharmD []  , PharmD, BCPS []  Estella Husk, PharmD  Pharmacy Team [x]  Lysle Pearl, PharmD []  , PharmD []  Phillips Climes, PharmD []  , Rph []  Agapito Games) , PharmD []  Verlan Friends, PharmD []  , PharmD []  Mervyn Gay, PharmD []  , PharmD []  Vinnie Level, PharmD []  [facility], PharmD []  , PharmD []  Sharin Mons, PharmD   Positive urine culture No treatment, asymptomatic bacteria, do not treat and no further patient follow-up is required at this time.  10/05/2021, 12:20 PM

## 2021-10-05 NOTE — Progress Notes (Signed)
ED Antimicrobial Stewardship Positive Culture Follow Up   Stephanie Atkinson is an 88 y.o. female who presented to [facility] on 10/01/2021 with a chief complaint of  Chief Complaint  Patient presents with   Back Pain    Recent Results (from the past 720 hour(s))  Urine Culture     Status: Abnormal   Collection Time: 10/01/21  4:59 PM   Specimen: Urine, Clean Catch  Result Value Ref Range Status   Specimen Description   Final    URINE, CLEAN CATCH Performed at [facility], [address] [place], [place] [address]    Special Requests   Final    NONE Performed at [facility], [address], [place], [place] [address]    Culture 50,000 COLONIES/mL KLEBSIELLA OXYTOCA (A)  Final   Report Status 10/04/2021 FINAL  Final   Organism ID, Bacteria KLEBSIELLA OXYTOCA (A)  Final      Susceptibility   Klebsiella oxytoca - MIC*    AMPICILLIN RESISTANT Resistant     CEFAZOLIN <=4 SENSITIVE Sensitive     CEFEPIME <=0.12 SENSITIVE Sensitive     CEFTRIAXONE <=0.25 SENSITIVE Sensitive     CIPROFLOXACIN <=0.25 SENSITIVE Sensitive     GENTAMICIN <=1 SENSITIVE Sensitive     IMIPENEM <=0.25 SENSITIVE Sensitive     NITROFURANTOIN <=16 SENSITIVE Sensitive     TRIMETH/SULFA <=20 SENSITIVE Sensitive     AMPICILLIN/SULBACTAM <=2 SENSITIVE Sensitive     PIP/TAZO <=4 SENSITIVE Sensitive     * 50,000 COLONIES/mL KLEBSIELLA OXYTOCA    Patient is a 89 YOF appeared in ED on 8/7 complaining of upper/thoracic back pain that sometimes radiates to her legs. She said this has been occurring for about a week. No dysuria.  Patient endorses urinary frequency, but the likely cause is her PTA Lasix. She was d/c'ed w/out abx   UA was not indicative of a UTI: Nitrite neg, Leuko trace, WBC 0-5, epithelial 0-5. Patient's Ucx grew an insignificant amount (50,000 colonies/mL) of Klebsiella oxytoca w/ resistance to ampicillin.    Spoke to provider and it was agreed that d/t patient's lack  of urinary symptoms, benign UA, and insignificant colony count on Ucx that it is not necessary to treat her as she appears to have asymptomatic bacteriuria.  Plan - Continue to not treat, asymptomatic bacteriuria   ED Provider: Vance Gather 10/05/2021, 11:10 AM PharmD Candidate Monday - Friday phone -  [phone] Saturday - Sunday phone - [phone]

## 2021-10-12 ENCOUNTER — Ambulatory Visit (INDEPENDENT_AMBULATORY_CARE_PROVIDER_SITE_OTHER): Payer: Medicare PPO

## 2021-10-12 DIAGNOSIS — I5032 Chronic diastolic (congestive) heart failure: Secondary | ICD-10-CM

## 2021-10-12 LAB — CUP PACEART REMOTE DEVICE CHECK
Battery Remaining Longevity: 94 mo
Battery Voltage: 2.98 V
Brady Statistic RV Percent Paced: 99.49 %
Date Time Interrogation Session: 20230818004359
Implantable Lead Implant Date: 20190522
Implantable Lead Location: 753860
Implantable Lead Model: 5076
Implantable Pulse Generator Implant Date: 20190522
Lead Channel Impedance Value: 361 Ohm
Lead Channel Impedance Value: 437 Ohm
Lead Channel Pacing Threshold Amplitude: 0.625 V
Lead Channel Pacing Threshold Pulse Width: 0.4 ms
Lead Channel Sensing Intrinsic Amplitude: 6 mV
Lead Channel Sensing Intrinsic Amplitude: 6 mV
Lead Channel Setting Pacing Amplitude: 2.5 V
Lead Channel Setting Pacing Pulse Width: 0.4 ms
Lead Channel Setting Sensing Sensitivity: 1.2 mV

## 2021-10-30 MED ORDER — LIDOCAINE HCL 1 % IJ SOLN
3.0000 mL | INTRAMUSCULAR | Status: AC | PRN
  Administered 2021-10-02: 3 mL

## 2021-10-30 MED ORDER — METHYLPREDNISOLONE ACETATE 40 MG/ML IJ SUSP
80.0000 mg | INTRAMUSCULAR | Status: AC | PRN
  Administered 2021-10-02: 80 mg via INTRA_ARTICULAR

## 2021-10-30 MED ORDER — BUPIVACAINE HCL 0.5 % IJ SOLN
6.0000 mL | INTRAMUSCULAR | Status: AC | PRN
  Administered 2021-10-02: 6 mL via INTRA_ARTICULAR

## 2021-11-05 ENCOUNTER — Encounter: Payer: Self-pay | Admitting: Nurse Practitioner

## 2021-11-05 ENCOUNTER — Ambulatory Visit: Payer: Medicare PPO | Attending: Nurse Practitioner | Admitting: Nurse Practitioner

## 2021-11-05 VITALS — BP 132/68 | HR 76 | Ht 63.0 in | Wt 140.6 lb

## 2021-11-05 DIAGNOSIS — N183 Chronic kidney disease, stage 3 unspecified: Secondary | ICD-10-CM | POA: Diagnosis not present

## 2021-11-05 DIAGNOSIS — I2510 Atherosclerotic heart disease of native coronary artery without angina pectoris: Secondary | ICD-10-CM | POA: Diagnosis not present

## 2021-11-05 DIAGNOSIS — I10 Essential (primary) hypertension: Secondary | ICD-10-CM

## 2021-11-05 DIAGNOSIS — I4891 Unspecified atrial fibrillation: Secondary | ICD-10-CM

## 2021-11-05 DIAGNOSIS — E039 Hypothyroidism, unspecified: Secondary | ICD-10-CM

## 2021-11-05 DIAGNOSIS — Z87898 Personal history of other specified conditions: Secondary | ICD-10-CM

## 2021-11-05 DIAGNOSIS — E785 Hyperlipidemia, unspecified: Secondary | ICD-10-CM

## 2021-11-05 NOTE — Patient Instructions (Signed)
Medication Instructions:  Your physician recommends that you continue on your current medications as directed. Please refer to the Current Medication list given to you today.  *If you need a refill on your cardiac medications before your next appointment, please call your pharmacy*  Lab Work: NONE ordered at this time of appointment   If you have labs (blood work) drawn today and your tests are completely normal, you will receive your results only by: MyChart Message (if you have MyChart) OR A paper copy in the mail If you have any lab test that is abnormal or we need to change your treatment, we will call you to review the results.  Testing/Procedures: NONE ordered at this time of appointment   Follow-Up: At [facility], you and your health needs are our priority.  As part of our continuing mission to provide you with exceptional heart care, we have created designated Provider Care Teams.  These Care Teams include your primary Cardiologist (physician) and Advanced Practice Providers (APPs -  Physician Assistants and Nurse Practitioners) who all work together to provide you with the care you need, when you need it.  We recommend signing up for the patient portal called "MyChart".  Sign up information is provided on this After Visit Summary.  MyChart is used to connect with patients for Virtual Visits (Telemedicine).  Patients are able to view lab/test results, encounter notes, upcoming appointments, etc.  Non-urgent messages can be sent to your provider as well.   To learn more about what you can do with MyChart, go to [email or web address].    Your next appointment:   6 month(s)  The format for your next appointment:   In Person  Provider:   Jonathan Berry, MD     Other Instructions  Important Information About Sugar       

## 2021-11-05 NOTE — Progress Notes (Signed)
Office Visit    Patient Name: Stephanie Atkinson Date of Encounter: 11/05/2021  Primary Care Provider:  Linus Galas, NP Primary Cardiologist:  Nanetta Batty, MD  Chief Complaint    88 year old female with a history of CAD s/p CABG x4 in 2006, s/p DES-PDA in 2012, paroxysmal atrial fibrillation, syncope s/p ILR in 2014, symptomatic bradycardia s/p permanent transvenous pacemaker in 2019, hypertension, hyperlipidemia, CKD stage III, and hypothyroidism who presents for follow-up related to CAD and hypertension.  Past Medical History    Past Medical History:  Diagnosis Date   Chronic kidney disease, stage 3 (HCC)    Coronary artery disease    Hx of CABG 2006   LIMA-LAD, free RIMA-PDA, Radial-OM1-OM2   Hyperlipidemia    Hypertension    Hypothyroid    MI, old        PAF (paroxysmal atrial fibrillation) (HCC)    was on amio, d/c'd due to prolonged PR interval, rate control   Presence of permanent cardiac pacemaker 07/16/2017   Presence of stent in right coronary artery 07/12   RIMA-PDA occluded, DES stent placed   Syncope 07/12   bradycardic, beta blocker decreased, also felt to be dehydrated   Past Surgical History:  Procedure Laterality Date   CARDIAC CATHETERIZATION  101512   dr. Onalee Hua harding, revealing a atretic bypass to her right with patent distal RCA stent   CAROTID STENT  2012   CORONARY ARTERY BYPASS GRAFT  2006   LIMA-LAD, free RIMA-PDA, Radial-OM1-OM2   DOPPLER ECHOCARDIOGRAPHY  [id number]   mild asymmetric left ventricular hypertrophy, left ventricular systolic function is low normal, ejection fraction = 50-55%, the LA is moderate dilated, the RA is mildly dilated, no significant valvular disease   INSERT / REPLACE / REMOVE PACEMAKER  07/16/2017   JOINT REPLACEMENT     hip replacement   LEFT HEART CATHETERIZATION WITH CORONARY/GRAFT ANGIOGRAM N/A 10/12/2012   Procedure: LEFT HEART CATHETERIZATION WITH Isabel Caprice;  Surgeon: Marykay Lex, MD;  Location:  [facility] CATH LAB;  Service: Cardiovascular;  Laterality: N/A;   LOOP RECORDER IMPLANT N/A 10/15/2012   Procedure: LOOP RECORDER IMPLANT;  Surgeon: Thurmon Fair, MD;  Location: [facility] CATH LAB;  Service: Cardiovascular;  Laterality: N/A;   LOOP RECORDER REMOVAL  07/16/2017   LOOP RECORDER REMOVAL N/A 07/16/2017   Procedure: LOOP RECORDER REMOVAL;  Surgeon: Thurmon Fair, MD;  Location: [facility] INVASIVE CV LAB;  Service: Cardiovascular;  Laterality: N/A;   NM MYOVIEW LTD  100510   post stress left ventricle is normal in size, post stress ejection fraction is 67% global left ventricular systolic function is normal, normal myocardial perfusion study, abnormal myocardial perfusion study, low risk scan   PACEMAKER IMPLANT N/A 07/16/2017   Procedure: PACEMAKER IMPLANT;  Surgeon: Thurmon Fair, MD;  Location: [facility] INVASIVE CV LAB;  Service: Cardiovascular;  Laterality: N/A;   THYROIDECTOMY      Allergies  Allergies  Allergen Reactions   Penicillins Hives    Did it involve swelling of the face/tongue/throat, SOB, or low BP? No Did it involve sudden or severe rash/hives, skin peeling, or any reaction on the inside of your mouth or nose? No Did you need to seek medical attention at a hospital or doctor's office? No When did it last happen? Unk    If all above answers are "NO", may proceed with cephalosporin use.    Sulfa Antibiotics Hives and Swelling    History of Present Illness    88 year old female with the above past medical  history including CAD s/p CABG x4 in 2006, s/p DES-PDA in 2012, paroxysmal atrial fibrillation, syncope s/p ILR in 2014, symptomatic bradycardia s/p permanent transvenous pacemaker in 2019, hypertension, hyperlipidemia, CKD stage III, and hypothyroidism.  She has a history of CAD s/p CABG x4 in 2006 with subsequent DES-PDA in July 2012.  This was patent on follow-up cath in October 2012.  Repeat cardiac catheterization in 2014 showed stable coronary anatomy.  She, medical therapy was  advised.  She has a history of paroxysmal atrial fibrillation since the time of her CABG.  She was previously on amiodarone, however, this was discontinued in the setting of prolonged PR interval.  She is on Eliquis for chronic anticoagulation.  She had a syncopal episode in 09/2012.  She underwent ILR implantation.  She was later noted to have fatigue and a slow ventricular response to her atrial fibrillation despite being on no negative chronotropic drugs.  Therefore, she underwent permanent transvenous pacemaker implantation by Dr. Royann Shivers in 06/2017.  She was lat seen in the office on 05/21/2021 and was stable overall from a cardiac standpoint.  BP was slightly elevated.  She was restarted on valsartan.  She was seen in the hypertension clinic for follow-up on 06/11/2021. BP was on a combination of valsartan, amlodipine, and spironolactone.  She was advised to take her amlodipine in the evenings, with BP goal of < 140/80.  She was evaluated in the ED on 10/01/2021 in the setting of back pain which improved with lidocaine patch.  She presents today for follow-up accompanied by her daughter. Since her last visit she has been stable from a cardiac standpoint.  She has had some issues with generalized musculoskeletal pain in the setting of multilevel lumbar spondylosis, following with orthopedics.  She had a steroid injection to her right greater trochanter on 10/02/2021 and her symptoms dramatically improved.  She notes occasional right ankle pain, she denies any injury.  She denies chest pain, dyspnea, edema, PND, orthopnea, weight gain.  Other than her orthopedic concerns, she reports feeling well denies any additional concerns today.  Home Medications    Current Outpatient Medications  Medication Sig Dispense Refill   acetaminophen (TYLENOL) 500 MG tablet Take 1 tablet (500 mg total) by mouth every 6 (six) hours as needed for moderate pain. 30 tablet 0   amLODipine (NORVASC) 10 MG tablet TAKE 1 TABLET BY MOUTH  EVERY DAY 90 tablet 3   apixaban (ELIQUIS) 5 MG TABS tablet TAKE 1 TABLET BY MOUTH TWICE A DAY 180 tablet 1   atorvastatin (LIPITOR) 80 MG tablet Take 80 mg by mouth at bedtime.      benzonatate (TESSALON) 100 MG capsule Take 1 capsule by mouth every 8 (eight) hours for cough. 21 capsule 0   cyclobenzaprine (FLEXERIL) 10 MG tablet 1/2 - 1 tablet     diphenhydrAMINE (BENADRYL) 25 MG tablet Take 25 mg by mouth every 6 (six) hours as needed for allergies.     fexofenadine (ALLEGRA) 180 MG tablet Take 180 mg by mouth daily.     furosemide (LASIX) 20 MG tablet TAKE 1 TABLET BY MOUTH EVERY DAY 90 tablet 3   hydroxypropyl methylcellulose / hypromellose (ISOPTO TEARS / GONIOVISC) 2.5 % ophthalmic solution Place 1 drop into both eyes 3 (three) times daily as needed for dry eyes.     levothyroxine (SYNTHROID, LEVOTHROID) 50 MCG tablet Take 50 mcg by mouth daily before breakfast.      lidocaine (LIDODERM) 5 % Place 1 patch onto the skin  daily. Remove & Discard patch within 12 hours or as directed by MD 30 patch 0   nitroGLYCERIN (NITROSTAT) 0.4 MG SL tablet Place 1 tablet (0.4 mg total) under the tongue every 5 (five) minutes as needed for chest pain. 25 tablet 6   spironolactone (ALDACTONE) 25 MG tablet TAKE 1 TABLET (25 MG TOTAL) BY MOUTH DAILY. 90 tablet 3   traMADol (ULTRAM) 50 MG tablet Take 50 mg by mouth every 6 (six) hours as needed (for pain).      valsartan (DIOVAN) 80 MG tablet Take 1 tablet (80 mg total) by mouth daily. 60 tablet 0   No current facility-administered medications for this visit.     Review of Systems    She denies chest pain, palpitations, dyspnea, pnd, orthopnea, n, v, dizziness, syncope, edema, weight gain, or early satiety. All other systems reviewed and are otherwise negative except as noted above.   Physical Exam    VS:  BP 132/68   Pulse 76   Ht 5\' 3"  (1.6 m)   Wt 140 lb 9.6 oz (63.8 kg)   SpO2 98%   BMI 24.91 kg/m   GEN: Well nourished, well developed, in no  acute distress. HEENT: normal. Neck: Supple, no JVD, carotid bruits, or masses. Cardiac: RRR, no murmurs, rubs, or gallops. No clubbing, cyanosis, edema.  Radials/DP/PT 2+ and equal bilaterally.  Respiratory:  Respirations regular and unlabored, clear to auscultation bilaterally. GI: Soft, nontender, nondistended, BS + x 4. MS: no deformity or atrophy. Skin: warm and dry, no rash. Neuro:  Strength and sensation are intact. Psych: Normal affect.  Accessory Clinical Findings    ECG personally reviewed by me today -V-paced, 76 bpm- no acute changes.   Lab Results  Component Value Date   WBC 11.4 (H) 11/30/2020   HGB 13.9 11/30/2020   HCT 43.9 11/30/2020   MCV 85.7 11/30/2020   PLT 199 11/30/2020   Lab Results  Component Value Date   CREATININE 1.23 (H) 05/28/2021   BUN 14 05/28/2021   NA 143 05/28/2021   K 4.6 05/28/2021   CL 105 05/28/2021   CO2 22 05/28/2021   Lab Results  Component Value Date   ALT 20 11/30/2020   AST 21 11/30/2020   ALKPHOS 71 11/30/2020   BILITOT 1.1 11/30/2020   Lab Results  Component Value Date   CHOL 127 11/25/2016   HDL 46 11/25/2016   LDLCALC 58 11/25/2016   TRIG 117 11/25/2016   CHOLHDL 2.8 11/25/2016    No results found for: "HGBA1C"  Assessment & Plan    1. CAD: CAD s/p CABG x4 in 2006 with subsequent DES-PDA in July 2012. Repeat cardiac catheterization in 2014 showed stable coronary anatomy. Continue amlodipine, valsartan, spironolactone, Lasix, and Lipitor.  Not on ASA in the setting of chronic DOAC therapy.  2. Paroxysmal atrial fibrillation with slow ventricular response: S/p permanent transvenous pacemaker implantation by Dr. 2015 in 06/2017 in the setting of symptomatic bradycardia.  EKG today shows V-paced rhythm, 76 bpm. She denies bleeding on Eliquis.  3. H/o syncope: S/p ILR in 2014-explanted at the time of pacemaker insertion. Denies any recurrent syncope.    4. Hypertension: BP well controlled. Continue current  antihypertensive regimen.   5. Hyperlipidemia: No recent LDL on file.  Continue Lipitor.  6. CKD stage III: Creatinine was 1.23 in 05/2021.  Stable.  7. Hypothyroidism:  TSH was 2.81 in 05/2021.  Monitored and managed per PCP.  8. Disposition: Follow-up in 6 months with Dr.  Magdalene River, NP 11/05/2021, 12:02 PM

## 2021-11-07 NOTE — Progress Notes (Signed)
Remote pacemaker transmission.   

## 2021-11-23 ENCOUNTER — Telehealth: Payer: Self-pay | Admitting: Cardiovascular Disease

## 2021-11-23 NOTE — Telephone Encounter (Signed)
Scheduler will call patient's daughter

## 2021-11-23 NOTE — Telephone Encounter (Signed)
Patient's daughter states she is returning a call, but does not know who called or what it was regarding.

## 2021-12-07 ENCOUNTER — Telehealth: Payer: Self-pay | Admitting: Nurse Practitioner

## 2021-12-07 ENCOUNTER — Encounter: Payer: Self-pay | Admitting: Cardiovascular Disease

## 2021-12-07 NOTE — Telephone Encounter (Signed)
error 

## 2021-12-07 NOTE — Telephone Encounter (Signed)
Pt c/o swelling: STAT is pt has developed SOB within 24 hours  If swelling, where is the swelling located? So much fluid- can hardly breathe  How much weight have you gained and in what time span? Not sure  Have you gained 3 pounds in a day or 5 pounds in a week?   Do you have a log of your daily weights (if so, list)?   Are you currently taking a fluid pill? yes  Are you currently SOB? yes  Have you traveled recently? no

## 2021-12-07 NOTE — Telephone Encounter (Signed)
Daughter stated patient and fluid and can hardly breathe. This began on Tuesday this week. Legs are swollen doesn't always elevate them. Spoke with patient who stated she is coughing up yellow phlegm. Has not checked her temp. Recommended she contact her PCP. She stated her PCP closed at noon. Recommended urgent care to be evaluated. Patient and daughter verbalized understanding and thanked me for taking their call.

## 2021-12-09 ENCOUNTER — Inpatient Hospital Stay (HOSPITAL_BASED_OUTPATIENT_CLINIC_OR_DEPARTMENT_OTHER)
Admission: EM | Admit: 2021-12-09 | Discharge: 2021-12-13 | DRG: 291 | Disposition: A | Discharge status: Home / Self Care | Payer: Medicare PPO | Attending: Internal Medicine | Admitting: Internal Medicine

## 2021-12-09 ENCOUNTER — Encounter (HOSPITAL_BASED_OUTPATIENT_CLINIC_OR_DEPARTMENT_OTHER): Payer: Self-pay

## 2021-12-09 ENCOUNTER — Other Ambulatory Visit: Payer: Self-pay

## 2021-12-09 ENCOUNTER — Emergency Department (HOSPITAL_BASED_OUTPATIENT_CLINIC_OR_DEPARTMENT_OTHER): Payer: Medicare PPO

## 2021-12-09 DIAGNOSIS — R079 Chest pain, unspecified: Secondary | ICD-10-CM | POA: Diagnosis not present

## 2021-12-09 DIAGNOSIS — E785 Hyperlipidemia, unspecified: Secondary | ICD-10-CM | POA: Diagnosis not present

## 2021-12-09 DIAGNOSIS — Z880 Allergy status to penicillin: Secondary | ICD-10-CM

## 2021-12-09 DIAGNOSIS — E1122 Type 2 diabetes mellitus with diabetic chronic kidney disease: Secondary | ICD-10-CM | POA: Diagnosis present

## 2021-12-09 DIAGNOSIS — I429 Cardiomyopathy, unspecified: Secondary | ICD-10-CM | POA: Diagnosis present

## 2021-12-09 DIAGNOSIS — Z79890 Hormone replacement therapy (postmenopausal): Secondary | ICD-10-CM

## 2021-12-09 DIAGNOSIS — I25810 Atherosclerosis of coronary artery bypass graft(s) without angina pectoris: Secondary | ICD-10-CM | POA: Diagnosis present

## 2021-12-09 DIAGNOSIS — I5043 Acute on chronic combined systolic (congestive) and diastolic (congestive) heart failure: Secondary | ICD-10-CM | POA: Diagnosis not present

## 2021-12-09 DIAGNOSIS — R001 Bradycardia, unspecified: Secondary | ICD-10-CM | POA: Diagnosis present

## 2021-12-09 DIAGNOSIS — I509 Heart failure, unspecified: Secondary | ICD-10-CM

## 2021-12-09 DIAGNOSIS — Z7901 Long term (current) use of anticoagulants: Secondary | ICD-10-CM | POA: Diagnosis not present

## 2021-12-09 DIAGNOSIS — Z79899 Other long term (current) drug therapy: Secondary | ICD-10-CM

## 2021-12-09 DIAGNOSIS — I130 Hypertensive heart and chronic kidney disease with heart failure and stage 1 through stage 4 chronic kidney disease, or unspecified chronic kidney disease: Principal | ICD-10-CM | POA: Diagnosis present

## 2021-12-09 DIAGNOSIS — I4821 Permanent atrial fibrillation: Secondary | ICD-10-CM | POA: Diagnosis present

## 2021-12-09 DIAGNOSIS — Z720 Tobacco use: Secondary | ICD-10-CM

## 2021-12-09 DIAGNOSIS — I5023 Acute on chronic systolic (congestive) heart failure: Secondary | ICD-10-CM | POA: Diagnosis present

## 2021-12-09 DIAGNOSIS — I252 Old myocardial infarction: Secondary | ICD-10-CM

## 2021-12-09 DIAGNOSIS — N189 Chronic kidney disease, unspecified: Secondary | ICD-10-CM | POA: Diagnosis not present

## 2021-12-09 DIAGNOSIS — I5031 Acute diastolic (congestive) heart failure: Secondary | ICD-10-CM | POA: Diagnosis not present

## 2021-12-09 DIAGNOSIS — Z955 Presence of coronary angioplasty implant and graft: Secondary | ICD-10-CM

## 2021-12-09 DIAGNOSIS — Z950 Presence of cardiac pacemaker: Secondary | ICD-10-CM | POA: Diagnosis not present

## 2021-12-09 DIAGNOSIS — I878 Other specified disorders of veins: Secondary | ICD-10-CM | POA: Diagnosis present

## 2021-12-09 DIAGNOSIS — I872 Venous insufficiency (chronic) (peripheral): Secondary | ICD-10-CM | POA: Diagnosis present

## 2021-12-09 DIAGNOSIS — E7800 Pure hypercholesterolemia, unspecified: Secondary | ICD-10-CM | POA: Diagnosis present

## 2021-12-09 DIAGNOSIS — Z8249 Family history of ischemic heart disease and other diseases of the circulatory system: Secondary | ICD-10-CM | POA: Diagnosis not present

## 2021-12-09 DIAGNOSIS — R0609 Other forms of dyspnea: Secondary | ICD-10-CM

## 2021-12-09 DIAGNOSIS — N1832 Chronic kidney disease, stage 3b: Secondary | ICD-10-CM | POA: Diagnosis present

## 2021-12-09 DIAGNOSIS — I10 Essential (primary) hypertension: Secondary | ICD-10-CM | POA: Diagnosis present

## 2021-12-09 DIAGNOSIS — I110 Hypertensive heart disease with heart failure: Secondary | ICD-10-CM | POA: Diagnosis not present

## 2021-12-09 DIAGNOSIS — Z882 Allergy status to sulfonamides status: Secondary | ICD-10-CM

## 2021-12-09 DIAGNOSIS — I447 Left bundle-branch block, unspecified: Secondary | ICD-10-CM | POA: Diagnosis present

## 2021-12-09 DIAGNOSIS — I5033 Acute on chronic diastolic (congestive) heart failure: Secondary | ICD-10-CM | POA: Diagnosis present

## 2021-12-09 DIAGNOSIS — Z1152 Encounter for screening for COVID-19: Secondary | ICD-10-CM | POA: Diagnosis not present

## 2021-12-09 DIAGNOSIS — Z96649 Presence of unspecified artificial hip joint: Secondary | ICD-10-CM | POA: Diagnosis present

## 2021-12-09 DIAGNOSIS — E890 Postprocedural hypothyroidism: Secondary | ICD-10-CM | POA: Diagnosis present

## 2021-12-09 DIAGNOSIS — N179 Acute kidney failure, unspecified: Secondary | ICD-10-CM | POA: Diagnosis present

## 2021-12-09 DIAGNOSIS — E871 Hypo-osmolality and hyponatremia: Secondary | ICD-10-CM | POA: Diagnosis present

## 2021-12-09 DIAGNOSIS — N183 Chronic kidney disease, stage 3 unspecified: Secondary | ICD-10-CM | POA: Diagnosis present

## 2021-12-09 DIAGNOSIS — R0602 Shortness of breath: Secondary | ICD-10-CM | POA: Diagnosis not present

## 2021-12-09 LAB — CBC
HCT: 37.8 % (ref 36.0–46.0)
Hemoglobin: 12.3 g/dL (ref 12.0–15.0)
MCH: 28.4 pg (ref 26.0–34.0)
MCHC: 32.5 g/dL (ref 30.0–36.0)
MCV: 87.3 fL (ref 80.0–100.0)
Platelets: 198 10*3/uL (ref 150–400)
RBC: 4.33 MIL/uL (ref 3.87–5.11)
RDW: 13.2 % (ref 11.5–15.5)
WBC: 5.4 10*3/uL (ref 4.0–10.5)
nRBC: 0 % (ref 0.0–0.2)

## 2021-12-09 LAB — TROPONIN I (HIGH SENSITIVITY)
Troponin I (High Sensitivity): 12 ng/L (ref <18)
Troponin I (High Sensitivity): 13 ng/L (ref <18)

## 2021-12-09 LAB — SARS CORONAVIRUS 2 BY RT PCR: SARS Coronavirus 2 by RT PCR: NEGATIVE

## 2021-12-09 LAB — BASIC METABOLIC PANEL
Anion gap: 8 (ref 5–15)
BUN: 29 mg/dL — ABNORMAL HIGH (ref 8–23)
CO2: 23 mmol/L (ref 22–32)
Calcium: 10.5 mg/dL — ABNORMAL HIGH (ref 8.9–10.3)
Chloride: 102 mmol/L (ref 98–111)
Creatinine, Ser: 1.51 mg/dL — ABNORMAL HIGH (ref 0.44–1.00)
GFR, Estimated: 32 mL/min — ABNORMAL LOW (ref >60)
Glucose, Bld: 85 mg/dL (ref 70–99)
Potassium: 4 mmol/L (ref 3.5–5.1)
Sodium: 133 mmol/L — ABNORMAL LOW (ref 135–145)

## 2021-12-09 LAB — BRAIN NATRIURETIC PEPTIDE: B Natriuretic Peptide: 184.4 pg/mL — ABNORMAL HIGH (ref 0.0–100.0)

## 2021-12-09 NOTE — ED Notes (Signed)
Pt c/o sharp sudden sharp CP on R side. When RN into room pt reports it was already subsided. Rn notified Dr. [name]. Pt continues to be on continuous cardiac monitor.

## 2021-12-09 NOTE — Progress Notes (Signed)
  [facility] will assume care on arrival to accepting facility. Until arrival, care as per EDP. However, [facility] available 24/7 for questions and assistance.   Nursing staff please page [facility] Admits and Consults ([phone]) as soon as the patient arrives to the hospital.  Olvin Rohr, DO [facility]  

## 2021-12-09 NOTE — ED Notes (Signed)
Pt and family updated about POC

## 2021-12-09 NOTE — ED Provider Notes (Signed)
[facility] EMERGENCY DEPT Provider Note   CSN: [id number] Arrival date & time: 12/09/21  1814     History  Chief Complaint  Patient presents with   Shortness of Breath    Stephanie Atkinson is a 88 y.o. female with HTN, history CABG times 05/2004, RCA DES 2012, CKD stage III, chronic A-fib/accelerated junctional rhythm/symptomatic bradycardia s/p pacemaker, chronic diastolic heart failure, HLD, venous stasis of bilateral lower extremities presents with shortness of breath.  Patient reports shortness of breath of the last few days worse with exertion.  Denies cough, fevers chills but endorses she is spitting up some yellow mucus. Worsening DOE. A/w generalized weakness.  Denies any chest pain, nausea vomiting diarrhea constipation, lower extremity edema worse than normal. While here in ED also had very short-lasting sharp chest pain as well as one in her left wrist that went away on its own. Has taken an extra dose of lasix for the last two days which hasn't helped.   Shortness of Breath      Home Medications Prior to Admission medications   Medication Sig Start Date End Date Taking? Authorizing Provider  acetaminophen (TYLENOL) 500 MG tablet Take 1 tablet (500 mg total) by mouth every 6 (six) hours as needed for moderate pain. 06/27/19   Alma Friendly, MD  amLODipine (NORVASC) 10 MG tablet TAKE 1 TABLET BY MOUTH EVERY DAY 03/27/21   Lorretta Harp, MD  apixaban (ELIQUIS) 5 MG TABS tablet TAKE 1 TABLET BY MOUTH TWICE A DAY 09/12/21   Lorretta Harp, MD  atorvastatin (LIPITOR) 80 MG tablet Take 80 mg by mouth at bedtime.     [provider]  benzonatate (TESSALON) 100 MG capsule Take 1 capsule by mouth every 8 (eight) hours for cough. 11/06/20   Vanessa Kick, MD  cyclobenzaprine (FLEXERIL) 10 MG tablet 1/2 - 1 tablet 05/30/16   [provider]  diphenhydrAMINE (BENADRYL) 25 MG tablet Take 25 mg by mouth every 6 (six) hours as needed for allergies.     [provider]  fexofenadine (ALLEGRA) 180 MG tablet Take 180 mg by mouth daily.    [provider]  furosemide (LASIX) 20 MG tablet TAKE 1 TABLET BY MOUTH EVERY DAY 02/12/21   Lorretta Harp, MD  hydroxypropyl methylcellulose / hypromellose (ISOPTO TEARS / GONIOVISC) 2.5 % ophthalmic solution Place 1 drop into both eyes 3 (three) times daily as needed for dry eyes.    [provider]  levothyroxine (SYNTHROID, LEVOTHROID) 50 MCG tablet Take 50 mcg by mouth daily before breakfast.     [provider]  lidocaine (LIDODERM) 5 % Place 1 patch onto the skin daily. Remove & Discard patch within 12 hours or as directed by MD 10/01/21   Loni Beckwith, PA-C  nitroGLYCERIN (NITROSTAT) 0.4 MG SL tablet Place 1 tablet (0.4 mg total) under the tongue every 5 (five) minutes as needed for chest pain. 05/07/21   Lorretta Harp, MD  spironolactone (ALDACTONE) 25 MG tablet TAKE 1 TABLET (25 MG TOTAL) BY MOUTH DAILY. 07/03/21   Lorretta Harp, MD  traMADol (ULTRAM) 50 MG tablet Take 50 mg by mouth every 6 (six) hours as needed (for pain).     [provider]  valsartan (DIOVAN) 80 MG tablet Take 1 tablet (80 mg total) by mouth daily. 09/12/21   Lorretta Harp, MD      Allergies    Penicillins and Sulfa antibiotics    Review of Systems  Review of Systems  Respiratory:  Positive for shortness of breath.    Review of systems negative for f/c.  A 10 point review of systems was performed and is negative unless otherwise reported in HPI.  Physical Exam Updated Vital Signs BP 132/64   Pulse 60   Temp 98.1 F (36.7 C)   Resp 19   Ht 5\' 3"  (1.6 m)   Wt 67.8 kg   SpO2 97%   BMI 26.47 kg/m  Physical Exam General: Normal appearing elderly female, lying in bed.  HEENT: PERRLA, Sclera anicteric, MMM, trachea midline. Cardiology: RRR, no murmurs/rubs/gallops. BL radial and DP pulses equal bilaterally.  Resp: Normal respiratory rate and effort. CTAB, no  wheezes, rhonchi, crackles.  Abd: Soft, non-tender, non-distended. No rebound tenderness or guarding.  GU: Deferred. MSK: No peripheral edema or signs of trauma. Extremities without deformity or TTP. No cyanosis or clubbing. Skin: warm, dry. No rashes or lesions. Back: No CVA tenderness Neuro: A&Ox4, CNs II-XII grossly intact. MAEs. Sensation grossly intact.  Psych: Normal mood and affect.   ED Results / Procedures / Treatments   Labs (all labs ordered are listed, but only abnormal results are displayed) Labs Reviewed  BASIC METABOLIC PANEL - Abnormal; Notable for the following components:      Result Value   Sodium 133 (*)    BUN 29 (*)    Creatinine, Ser 1.51 (*)    Calcium 10.5 (*)    GFR, Estimated 32 (*)    All other components within normal limits  BRAIN NATRIURETIC PEPTIDE - Abnormal; Notable for the following components:   B Natriuretic Peptide 184.4 (*)    All other components within normal limits  CBC  TROPONIN I (HIGH SENSITIVITY)  TROPONIN I (HIGH SENSITIVITY)    EKG EKG Interpretation  Date/Time:  Sunday December 09 2021 18:31:48 EDT Ventricular Rate:  67 PR Interval:    QRS Duration: 140 QT Interval:  418 QTC Calculation: 441 R Axis:   -73 Text Interpretation: Ventricular-paced rhythm Abnormal ECG When compared with ECG of 30-Nov-2020 19:59, Vent. rate has decreased BY   2 BPM Confirmed by Cindee Lame [phone]) on 12/09/2021 6:38:43 PM  Radiology DG Chest Port 1 View  Result Date: 12/09/2021 CLINICAL DATA:  Chest pain and shortness of breath EXAM: PORTABLE CHEST 1 VIEW COMPARISON:  None Available. FINDINGS: Moderate cardiomegaly. Remote median sternotomy and pacemaker placement. No focal airspace consolidation or pulmonary edema. Advanced osteoarthrosis at both shoulders. IMPRESSION: Moderate cardiomegaly. No acute cardiopulmonary process. Electronically Signed   By: Ulyses Jarred M.D.   On: 12/09/2021 19:39    Procedures Procedures    Medications  Ordered in ED Medications - No data to display  ED Course/ Medical Decision Making/ A&P                          Medical Decision Making Amount and/or Complexity of Data Reviewed Labs: ordered. Radiology: ordered. Decision-making details documented in ED Course.  Risk Decision regarding hospitalization.   Patient is overall well-appearing, HDS, no hypoxia. Patient's symptoms overall sound like volume overload or a CHF exacerbation. She has had recent titrating of her diuretics as an o/p.   DDX for dyspnea includes but is not limited to:  Cardiac- CHF, Myocardial Ischemia, Valvular heart disease, Arrythmia Respiratory - pulm edema/pleural effusion. Less likely PE with no s/s of DVT, not most likely diagnosis.  Other - Anemia   Patient's labs demonstrate AKI Cr 1.51 up from ~  1.1-1.2. Na 133, BUN 29. Troponin negative 12-13. No leukocytosis or anemia. Covid negative. EKG w/o signs of ischemia. No c/f MI. Elevated BNP lends to CHF as diagnosis w/ DOE/orthopnea today.   D/w patient and her daughter. Do not believe that a single dose of IV diuretics will turn patient around, as she does not have significant demonstrable fluid overload on peripheral exam or on her CXR. She has taken extra doses of lasix at home which aren't helping and her renal function has been labile with diuretics in the past. Patient feels okay while seated here in ED but states the DOE is significantly interfering with her ADLs. Will discuss case with hospitalist to get input.   I have personally reviewed and interpreted all labs and imaging.   Clinical Course as of 12/31/21 1314  Sun Dec 09, 2021  2012 DG Chest [place] 1 View Moderate cardiomegaly. No acute cardiopulmonary process. [HN]  2149 D/w hospitalist who agreed that patient would benefit from admission for diuresis and optimization of diuretics. [HN]    Clinical Course User Index [HN] Audley Hose, MD    Dispo: admit         Final Clinical  Impression(s) / ED Diagnoses Final diagnoses:  AKI (acute kidney injury) ([place])  Acute on chronic diastolic congestive heart failure ([place])  Dyspnea on exertion    Rx / DC Orders ED Discharge Orders     None        This note was created using dictation software, which may contain spelling or grammatical errors.    Audley Hose, MD 12/31/21 1323

## 2021-12-09 NOTE — ED Triage Notes (Signed)
Pt reports sob over the past few days, states it worsens with exertion. Denies cough but reports she is spitting up some yellow mucus. No other associated symptoms. Pt AxOx4.

## 2021-12-10 DIAGNOSIS — I25810 Atherosclerosis of coronary artery bypass graft(s) without angina pectoris: Secondary | ICD-10-CM | POA: Diagnosis not present

## 2021-12-10 DIAGNOSIS — N1832 Chronic kidney disease, stage 3b: Secondary | ICD-10-CM | POA: Diagnosis not present

## 2021-12-10 DIAGNOSIS — E1122 Type 2 diabetes mellitus with diabetic chronic kidney disease: Secondary | ICD-10-CM | POA: Diagnosis not present

## 2021-12-10 DIAGNOSIS — Z950 Presence of cardiac pacemaker: Secondary | ICD-10-CM | POA: Diagnosis not present

## 2021-12-10 DIAGNOSIS — Z882 Allergy status to sulfonamides status: Secondary | ICD-10-CM | POA: Diagnosis not present

## 2021-12-10 DIAGNOSIS — I5033 Acute on chronic diastolic (congestive) heart failure: Secondary | ICD-10-CM | POA: Diagnosis present

## 2021-12-10 DIAGNOSIS — I130 Hypertensive heart and chronic kidney disease with heart failure and stage 1 through stage 4 chronic kidney disease, or unspecified chronic kidney disease: Secondary | ICD-10-CM | POA: Diagnosis not present

## 2021-12-10 DIAGNOSIS — I878 Other specified disorders of veins: Secondary | ICD-10-CM | POA: Diagnosis not present

## 2021-12-10 DIAGNOSIS — Z955 Presence of coronary angioplasty implant and graft: Secondary | ICD-10-CM | POA: Diagnosis not present

## 2021-12-10 DIAGNOSIS — E7800 Pure hypercholesterolemia, unspecified: Secondary | ICD-10-CM | POA: Diagnosis not present

## 2021-12-10 DIAGNOSIS — N179 Acute kidney failure, unspecified: Secondary | ICD-10-CM | POA: Diagnosis not present

## 2021-12-10 DIAGNOSIS — Z720 Tobacco use: Secondary | ICD-10-CM | POA: Diagnosis not present

## 2021-12-10 DIAGNOSIS — I4821 Permanent atrial fibrillation: Secondary | ICD-10-CM | POA: Diagnosis not present

## 2021-12-10 DIAGNOSIS — I429 Cardiomyopathy, unspecified: Secondary | ICD-10-CM | POA: Diagnosis not present

## 2021-12-10 DIAGNOSIS — I447 Left bundle-branch block, unspecified: Secondary | ICD-10-CM | POA: Diagnosis not present

## 2021-12-10 DIAGNOSIS — E890 Postprocedural hypothyroidism: Secondary | ICD-10-CM | POA: Diagnosis not present

## 2021-12-10 DIAGNOSIS — Z880 Allergy status to penicillin: Secondary | ICD-10-CM | POA: Diagnosis not present

## 2021-12-10 DIAGNOSIS — I5023 Acute on chronic systolic (congestive) heart failure: Secondary | ICD-10-CM | POA: Diagnosis not present

## 2021-12-10 DIAGNOSIS — E871 Hypo-osmolality and hyponatremia: Secondary | ICD-10-CM | POA: Diagnosis not present

## 2021-12-10 DIAGNOSIS — Z8249 Family history of ischemic heart disease and other diseases of the circulatory system: Secondary | ICD-10-CM | POA: Diagnosis not present

## 2021-12-10 DIAGNOSIS — Z79899 Other long term (current) drug therapy: Secondary | ICD-10-CM | POA: Diagnosis not present

## 2021-12-10 DIAGNOSIS — Z7901 Long term (current) use of anticoagulants: Secondary | ICD-10-CM | POA: Diagnosis not present

## 2021-12-10 DIAGNOSIS — I252 Old myocardial infarction: Secondary | ICD-10-CM | POA: Diagnosis not present

## 2021-12-10 DIAGNOSIS — Z1152 Encounter for screening for COVID-19: Secondary | ICD-10-CM | POA: Diagnosis not present

## 2021-12-10 DIAGNOSIS — I872 Venous insufficiency (chronic) (peripheral): Secondary | ICD-10-CM | POA: Diagnosis not present

## 2021-12-10 LAB — TSH: TSH: 1.602 u[IU]/mL (ref 0.350–4.500)

## 2021-12-10 LAB — MAGNESIUM: Magnesium: 2 mg/dL (ref 1.7–2.4)

## 2021-12-10 MED ORDER — ATORVASTATIN CALCIUM 80 MG PO TABS
80.0000 mg | ORAL_TABLET | Freq: Every day | ORAL | Status: DC
  Administered 2021-12-10 – 2021-12-12 (×3): 80 mg via ORAL
  Filled 2021-12-10 (×3): qty 1

## 2021-12-10 MED ORDER — SODIUM CHLORIDE 0.9% FLUSH
3.0000 mL | Freq: Two times a day (BID) | INTRAVENOUS | Status: DC
  Administered 2021-12-10 – 2021-12-12 (×5): 3 mL via INTRAVENOUS

## 2021-12-10 MED ORDER — APIXABAN 2.5 MG PO TABS
2.5000 mg | ORAL_TABLET | Freq: Two times a day (BID) | ORAL | Status: DC
  Administered 2021-12-10 – 2021-12-13 (×6): 2.5 mg via ORAL
  Filled 2021-12-10 (×7): qty 1

## 2021-12-10 MED ORDER — ACETAMINOPHEN 650 MG RE SUPP: 650.0000 mg | Freq: Four times a day (QID) | RECTAL | Status: DC | PRN

## 2021-12-10 MED ORDER — APIXABAN 5 MG PO TABS
5.0000 mg | ORAL_TABLET | Freq: Two times a day (BID) | ORAL | Status: DC
  Administered 2021-12-10 (×2): 5 mg via ORAL
  Filled 2021-12-10 (×2): qty 2

## 2021-12-10 MED ORDER — AMLODIPINE BESYLATE 10 MG PO TABS
10.0000 mg | ORAL_TABLET | Freq: Every day | ORAL | Status: DC
  Administered 2021-12-10 – 2021-12-11 (×2): 10 mg via ORAL
  Filled 2021-12-10: qty 2
  Filled 2021-12-10: qty 1

## 2021-12-10 MED ORDER — SPIRONOLACTONE 25 MG PO TABS
25.0000 mg | ORAL_TABLET | Freq: Every day | ORAL | Status: DC
  Administered 2021-12-10 – 2021-12-11 (×2): 25 mg via ORAL
  Filled 2021-12-10 (×2): qty 1

## 2021-12-10 MED ORDER — POLYVINYL ALCOHOL 1.4 % OP SOLN
1.0000 [drp] | Freq: Every day | OPHTHALMIC | Status: DC | PRN
  Filled 2021-12-10: qty 15

## 2021-12-10 MED ORDER — SODIUM CHLORIDE 0.9 % IV SOLN: 250.0000 mL | INTRAVENOUS | Status: DC | PRN

## 2021-12-10 MED ORDER — SODIUM CHLORIDE 0.9% FLUSH: 3.0000 mL | INTRAVENOUS | Status: DC | PRN

## 2021-12-10 MED ORDER — TRAMADOL HCL 50 MG PO TABS
50.0000 mg | ORAL_TABLET | Freq: Four times a day (QID) | ORAL | Status: DC | PRN
  Administered 2021-12-10 – 2021-12-13 (×8): 50 mg via ORAL
  Filled 2021-12-10 (×8): qty 1

## 2021-12-10 MED ORDER — IRBESARTAN 75 MG PO TABS
75.0000 mg | ORAL_TABLET | Freq: Every day | ORAL | Status: DC
  Administered 2021-12-10 – 2021-12-11 (×2): 75 mg via ORAL
  Filled 2021-12-10 (×3): qty 1

## 2021-12-10 MED ORDER — FUROSEMIDE 10 MG/ML IJ SOLN
40.0000 mg | Freq: Every day | INTRAMUSCULAR | Status: DC
  Administered 2021-12-10: 40 mg via INTRAVENOUS
  Filled 2021-12-10 (×2): qty 4

## 2021-12-10 MED ORDER — FUROSEMIDE 20 MG PO TABS
20.0000 mg | ORAL_TABLET | Freq: Every day | ORAL | Status: DC
  Administered 2021-12-10: 20 mg via ORAL
  Filled 2021-12-10: qty 1

## 2021-12-10 MED ORDER — LEVOTHYROXINE SODIUM 50 MCG PO TABS
50.0000 ug | ORAL_TABLET | Freq: Every day | ORAL | Status: DC
  Administered 2021-12-10 – 2021-12-13 (×4): 50 ug via ORAL
  Filled 2021-12-10 (×2): qty 1
  Filled 2021-12-10: qty 2
  Filled 2021-12-10: qty 1

## 2021-12-10 MED ORDER — ACETAMINOPHEN 325 MG PO TABS: 650.0000 mg | ORAL_TABLET | Freq: Four times a day (QID) | ORAL | Status: DC | PRN

## 2021-12-10 NOTE — H&P (Signed)
History and Physical    Patient: Stephanie Atkinson [id number] DOB: 1924/11/11 DOA: 12/09/2021 DOS: the patient was seen and examined on 12/10/2021 PCP: Deon Pilling, NP  Patient coming from:  [place]  - lives [place] in [place]. Uses walker at times if needed.    Chief Complaint: shortness of breath   HPI: Stephanie Atkinson is a 88 y.o. female with medical history significant of CAD s/p CABG, CKD stage 3, HTN, HLD, hypothyroidism, PAF, symptomatic bradycardia s/p PPM, diastolic CHF, venous stasis who presented to ED with complaints of shortness of breath. She states she started to have shortness of breath last Tuesday and Wednesday of last week that has progressively gotten worse. She also had a lot of mucous in her mouth that seemed copious. No coughing. She has shortness of breath on exertion that is new. She can not walk as far as she normally does without having to stop and take a break. She had an episode of chest pain last night over her left breast, over her PPM. She had same pain in her left thumb that felt the same in her chest. She was already at ER when this happened. Her shortness of breath became worse and didn't feel well so it prompted her to go to ED.   She also has taken an extra dose of lasix on Friday and Saturday.    She has chronic leg swelling from venous stasis, this is no worse. At her baseline. She endorses increased orthopnea. She also has some mild dysuria.    He has been feeling good. Denies any fever/chills, vision changes/headaches, chest pain or palpitations, cough, abdominal pain, N/V/D.    She does not smoke or drink alcohol.  She dips every other day.   ER Course:  vitals: afebrile, bp: 143/64, HR: 62, RR: 16, oxygen: 99%RA Pertinent labs: creatinine: 1.5 (1.2-1.4), bnp: 184,  CXR: moderate cardiomegaly. No acute process In ED: Trh asked to admit.   Review of Systems: As mentioned in the history of present illness. All other systems reviewed and are negative. Past  Medical History:  Diagnosis Date   Chronic kidney disease, stage 3 (HCC)    Coronary artery disease    Hx of CABG 2006   LIMA-LAD, free RIMA-PDA, Radial-OM1-OM2   Hyperlipidemia    Hypertension    Hypothyroid    MI, old        PAF (paroxysmal atrial fibrillation) (HCC)    was on amio, d/c'd due to prolonged PR interval, rate control   Presence of permanent cardiac pacemaker 07/16/2017   Presence of stent in right coronary artery 07/12   RIMA-PDA occluded, DES stent placed   Syncope 07/12   bradycardic, beta blocker decreased, also felt to be dehydrated   Past Surgical History:  Procedure Laterality Date   CARDIAC CATHETERIZATION  [id number]   dr. Shanon Brow harding, revealing a atretic bypass to her right with patent distal RCA stent   CAROTID STENT  2012   CORONARY ARTERY BYPASS GRAFT  2006   LIMA-LAD, free RIMA-PDA, Radial-OM1-OM2   DOPPLER ECHOCARDIOGRAPHY  [id number]   mild asymmetric left ventricular hypertrophy, left ventricular systolic function is low normal, ejection fraction = 50-55%, the LA is moderate dilated, the RA is mildly dilated, no significant valvular disease   INSERT / REPLACE / REMOVE PACEMAKER  07/16/2017   JOINT REPLACEMENT     hip replacement   LEFT HEART CATHETERIZATION WITH CORONARY/GRAFT ANGIOGRAM N/A 10/12/2012   Procedure: LEFT HEART CATHETERIZATION WITH CORONARY/GRAFT ANGIOGRAM;  Surgeon: Leonie Man, MD;  Location: [facility] CATH LAB;  Service: Cardiovascular;  Laterality: N/A;   LOOP RECORDER IMPLANT N/A 10/15/2012   Procedure: LOOP RECORDER IMPLANT;  Surgeon: Sanda Klein, MD;  Location: [facility] CATH LAB;  Service: Cardiovascular;  Laterality: N/A;   LOOP RECORDER REMOVAL  07/16/2017   LOOP RECORDER REMOVAL N/A 07/16/2017   Procedure: LOOP RECORDER REMOVAL;  Surgeon: Sanda Klein, MD;  Location: [facility] CV LAB;  Service: Cardiovascular;  Laterality: N/A;   NM MYOVIEW LTD  [id number]   post stress left ventricle is normal in size, post stress ejection fraction is  67% global left ventricular systolic function is normal, normal myocardial perfusion study, abnormal myocardial perfusion study, low risk scan   PACEMAKER IMPLANT N/A 07/16/2017   Procedure: PACEMAKER IMPLANT;  Surgeon: Sanda Klein, MD;  Location: [facility] CV LAB;  Service: Cardiovascular;  Laterality: N/A;   THYROIDECTOMY     Social History:  reports that she has never smoked. Her smokeless tobacco use includes snuff. She reports that she does not drink alcohol and does not use drugs.  Allergies  Allergen Reactions   Penicillins Hives    Did it involve swelling of the face/tongue/throat, SOB, or low BP? No Did it involve sudden or severe rash/hives, skin peeling, or any reaction on the inside of your mouth or nose? No Did you need to seek medical attention at a hospital or doctor's office? No When did it last happen? Unk    If all above answers are "NO", may proceed with cephalosporin use.    Sulfa Antibiotics Hives and Swelling    Family History  Problem Relation Age of Onset   CAD Mother    CAD Maternal Grandmother     Prior to Admission medications   Medication Sig Start Date End Date Taking? Authorizing Provider  acetaminophen (TYLENOL) 500 MG tablet Take 1 tablet (500 mg total) by mouth every 6 (six) hours as needed for moderate pain. 06/27/19   Alma Friendly, MD  amLODipine (NORVASC) 10 MG tablet TAKE 1 TABLET BY MOUTH EVERY DAY 03/27/21   Lorretta Harp, MD  apixaban (ELIQUIS) 5 MG TABS tablet TAKE 1 TABLET BY MOUTH TWICE A DAY 09/12/21   Lorretta Harp, MD  atorvastatin (LIPITOR) 80 MG tablet Take 80 mg by mouth at bedtime.     [provider]  benzonatate (TESSALON) 100 MG capsule Take 1 capsule by mouth every 8 (eight) hours for cough. 11/06/20   Vanessa Kick, MD  cyclobenzaprine (FLEXERIL) 10 MG tablet 1/2 - 1 tablet 05/30/16   [provider]  diphenhydrAMINE (BENADRYL) 25 MG tablet Take 25 mg by mouth every 6 (six) hours as needed for  allergies.    [provider]  fexofenadine (ALLEGRA) 180 MG tablet Take 180 mg by mouth daily.    [provider]  furosemide (LASIX) 20 MG tablet TAKE 1 TABLET BY MOUTH EVERY DAY 02/12/21   Lorretta Harp, MD  hydroxypropyl methylcellulose / hypromellose (ISOPTO TEARS / GONIOVISC) 2.5 % ophthalmic solution Place 1 drop into both eyes 3 (three) times daily as needed for dry eyes.    [provider]  levothyroxine (SYNTHROID, LEVOTHROID) 50 MCG tablet Take 50 mcg by mouth daily before breakfast.     [provider]  lidocaine (LIDODERM) 5 % Place 1 patch onto the skin daily. Remove & Discard patch within 12 hours or as directed by MD 10/01/21   Loni Beckwith, PA-C  nitroGLYCERIN (NITROSTAT) 0.4  MG SL tablet Place 1 tablet (0.4 mg total) under the tongue every 5 (five) minutes as needed for chest pain. 05/07/21   Lorretta Harp, MD  spironolactone (ALDACTONE) 25 MG tablet TAKE 1 TABLET (25 MG TOTAL) BY MOUTH DAILY. 07/03/21   Lorretta Harp, MD  traMADol (ULTRAM) 50 MG tablet Take 50 mg by mouth every 6 (six) hours as needed (for pain).     [provider]  valsartan (DIOVAN) 80 MG tablet Take 1 tablet (80 mg total) by mouth daily. 09/12/21   Lorretta Harp, MD    Physical Exam: Vitals:   12/10/21 1005 12/10/21 1150 12/10/21 1239 12/10/21 1243  BP:    137/65  Pulse:    64  Resp:    18  Temp: 97.7 F (36.5 C) 98.5 F (36.9 C)  97.8 F (36.6 C)  TempSrc: Oral Oral  Oral  SpO2:      Weight:   61.4 kg   Height:   5\' 3"  (1.6 m)    General:  Appears calm and comfortable and is in NAD Eyes:  PERRL, EOMI, normal lids, iris ENT:  HOH,  lips & tongue, mmm; appropriate dentition Neck:  no LAD, masses or thyromegaly; no carotid bruits, no JVD Cardiovascular:  NSR no m/r/g. Trace LE edema.  Respiratory:   CTA bilaterally with no wheezes/rales/rhonchi.  Normal respiratory effort. Abdomen:  soft, NT, ND, NABS Back:   normal alignment, no  CVAT Skin:  no rash or induration seen on limited exam Musculoskeletal:  grossly normal tone BUE/BLE, good ROM, no bony abnormality Lower extremity:  Limited foot exam with no ulcerations.  2+ distal pulses. Psychiatric:  grossly normal mood and affect, speech fluent and appropriate, AOx3 Neurologic:  CN 2-12 grossly intact, moves all extremities in coordinated fashion, sensation intact   Radiological Exams on Admission: Independently reviewed - see discussion in A/P where applicable  DG Chest Port 1 View  Result Date: 12/09/2021 CLINICAL DATA:  Chest pain and shortness of breath EXAM: PORTABLE CHEST 1 VIEW COMPARISON:  None Available. FINDINGS: Moderate cardiomegaly. Remote median sternotomy and pacemaker placement. No focal airspace consolidation or pulmonary edema. Advanced osteoarthrosis at both shoulders. IMPRESSION: Moderate cardiomegaly. No acute cardiopulmonary process. Electronically Signed   By: Ulyses Jarred M.D.   On: 12/09/2021 19:39    EKG: Independently reviewed.  NSR-v paced with rate 67; nonspecific ST changes with no evidence of acute ischemia   Labs on Admission: I have personally reviewed the available labs and imaging studies at the time of the admission.  Pertinent labs:   creatinine: 1.5 (1.2-1.4),  bnp: 184  Assessment and Plan: Principal Problem:   Acute on chronic diastolic CHF (congestive heart failure) (HCC) Active Problems:   Permanent atrial fibrillation (HCC)   Symptomatic bradycardia s/p PPM    CAD (coronary artery disease) of artery bypass graft   Chronic kidney disease, stage 3 (HCC)   Hypertension   Dyslipidemia   Venous stasis dermatitis of both lower extremities    Assessment and Plan: * Acute on chronic diastolic CHF (congestive heart failure) ([place]) 88 year old female presenting to ED with complaints of worsening shortness of breath and dyspnea on exertion with orthopnea, elevated BNP confirms patient is in acute on chronic diastolic CHF.   -observation to telemetry  -had increased her oral lasix to 20mg  BID on Friday and Saturday  -echo in 06/2019: EF of 50-55%. LVF with low normal function. Diastolic function indeterminate/ severely elevated left atrium, moderate right  atrium  -strict I/O and daily weights -repeat echo -lasix 40mg  IV daily, monitor renal function  -interrogate PPM to make sure not contributing to shortness of breath  -continue home medication of avapro, sprironolactone  -discussed low salt diet   Permanent atrial fibrillation (HCC) Continue eliquis, decrease dose to 2.5mg  with age +renal function -on no rate controlling drugs  -interrogate PPM   Symptomatic bradycardia s/p PPM  Interrogate PPM   CAD (coronary artery disease) of artery bypass graft -CAD s/p CABG x4 in 2006 with subsequent DES-PDA in July 2012. Repeat cardiac catheterization in 2014 showed stable coronary anatomy -continue medical management. Not on ASA due to DOAC therapy   Chronic kidney disease, stage 3 (HCC) Baseline 1.2-1.4, stable at 1.5 Strict I/O Watch closely with IV lasix.  Trend   Hypertension Well controlled Continue norvasc, spironolactone Watch with IV lasix.   Dyslipidemia Continue lipitor 80mg  daily   Venous stasis dermatitis of both lower extremities Stable TED hose     Advance Care Planning:   Code Status: Full Code   Consults: none   DVT Prophylaxis: eliquis   Family Communication: daughter and pastor at bedside.   Severity of Illness: The appropriate patient status for this patient is OBSERVATION. Observation status is judged to be reasonable and necessary in order to provide the required intensity of service to ensure the patient's safety. The patient's presenting symptoms, physical exam findings, and initial radiographic and laboratory data in the context of their medical condition is felt to place them at decreased risk for further clinical deterioration. Furthermore, it is anticipated that the  patient will be medically stable for discharge from the hospital within 2 midnights of admission.   Author: Orma Flaming, MD 12/10/2021 2:20 PM  For on call review [email or web address].

## 2021-12-10 NOTE — ED Notes (Signed)
Handoff report given to [facility]link 

## 2021-12-10 NOTE — Assessment & Plan Note (Signed)
-  CAD s/p CABG x4 in 2006 with subsequent DES-PDA in July 2012. Repeat cardiac catheterization in 2014 showed stable coronary anatomy -continue medical management. Not on ASA due to [place] therapy

## 2021-12-10 NOTE — Assessment & Plan Note (Signed)
Continue with atorvastatin 

## 2021-12-10 NOTE — Assessment & Plan Note (Signed)
Well controlled Continue norvasc, spironolactone Watch with IV lasix.

## 2021-12-10 NOTE — Assessment & Plan Note (Signed)
Baseline 1.2-1.4, stable at 1.5 Strict I/O Watch closely with IV lasix.  Trend

## 2021-12-10 NOTE — Assessment & Plan Note (Addendum)
Ventricular pacing with rate control Continue with carvedilol and anticoagulation with apixaban (renal dose for low GFR).

## 2021-12-10 NOTE — Assessment & Plan Note (Signed)
Interrogate PPM

## 2021-12-10 NOTE — Assessment & Plan Note (Signed)
Stable TED hose

## 2021-12-10 NOTE — Assessment & Plan Note (Deleted)
88 year old female presenting to ED with complaints of worsening shortness of breath and dyspnea on exertion with orthopnea, elevated BNP confirms patient is in acute on chronic diastolic CHF.  -observation to telemetry  -had increased her oral lasix to 20mg  BID on Friday and Saturday  -echo in 06/2019: EF of 50-55%. LVF with low normal function. Diastolic function indeterminate/ severely elevated left atrium, moderate right atrium  -strict I/O and daily weights -repeat echo -lasix 40mg  IV daily, monitor renal function  -interrogate PPM to make sure not contributing to shortness of breath  -continue home medication of avapro, sprironolactone  -discussed low salt diet

## 2021-12-11 ENCOUNTER — Observation Stay (HOSPITAL_COMMUNITY): Payer: Medicare PPO

## 2021-12-11 DIAGNOSIS — N1832 Chronic kidney disease, stage 3b: Secondary | ICD-10-CM | POA: Diagnosis present

## 2021-12-11 DIAGNOSIS — I5031 Acute diastolic (congestive) heart failure: Secondary | ICD-10-CM | POA: Diagnosis not present

## 2021-12-11 DIAGNOSIS — E890 Postprocedural hypothyroidism: Secondary | ICD-10-CM | POA: Diagnosis present

## 2021-12-11 DIAGNOSIS — I5023 Acute on chronic systolic (congestive) heart failure: Secondary | ICD-10-CM | POA: Diagnosis present

## 2021-12-11 DIAGNOSIS — Z8249 Family history of ischemic heart disease and other diseases of the circulatory system: Secondary | ICD-10-CM | POA: Diagnosis not present

## 2021-12-11 DIAGNOSIS — E7800 Pure hypercholesterolemia, unspecified: Secondary | ICD-10-CM | POA: Diagnosis present

## 2021-12-11 DIAGNOSIS — Z7901 Long term (current) use of anticoagulants: Secondary | ICD-10-CM | POA: Diagnosis not present

## 2021-12-11 DIAGNOSIS — I429 Cardiomyopathy, unspecified: Secondary | ICD-10-CM | POA: Diagnosis present

## 2021-12-11 DIAGNOSIS — Z880 Allergy status to penicillin: Secondary | ICD-10-CM | POA: Diagnosis not present

## 2021-12-11 DIAGNOSIS — I447 Left bundle-branch block, unspecified: Secondary | ICD-10-CM | POA: Diagnosis present

## 2021-12-11 DIAGNOSIS — I872 Venous insufficiency (chronic) (peripheral): Secondary | ICD-10-CM | POA: Diagnosis present

## 2021-12-11 DIAGNOSIS — E871 Hypo-osmolality and hyponatremia: Secondary | ICD-10-CM | POA: Diagnosis present

## 2021-12-11 DIAGNOSIS — Z79899 Other long term (current) drug therapy: Secondary | ICD-10-CM | POA: Diagnosis not present

## 2021-12-11 DIAGNOSIS — Z720 Tobacco use: Secondary | ICD-10-CM | POA: Diagnosis not present

## 2021-12-11 DIAGNOSIS — I4821 Permanent atrial fibrillation: Secondary | ICD-10-CM | POA: Diagnosis present

## 2021-12-11 DIAGNOSIS — I25810 Atherosclerosis of coronary artery bypass graft(s) without angina pectoris: Secondary | ICD-10-CM | POA: Diagnosis present

## 2021-12-11 DIAGNOSIS — Z955 Presence of coronary angioplasty implant and graft: Secondary | ICD-10-CM | POA: Diagnosis not present

## 2021-12-11 DIAGNOSIS — I130 Hypertensive heart and chronic kidney disease with heart failure and stage 1 through stage 4 chronic kidney disease, or unspecified chronic kidney disease: Secondary | ICD-10-CM | POA: Diagnosis present

## 2021-12-11 DIAGNOSIS — Z950 Presence of cardiac pacemaker: Secondary | ICD-10-CM | POA: Diagnosis not present

## 2021-12-11 DIAGNOSIS — I5033 Acute on chronic diastolic (congestive) heart failure: Secondary | ICD-10-CM | POA: Diagnosis present

## 2021-12-11 DIAGNOSIS — I252 Old myocardial infarction: Secondary | ICD-10-CM | POA: Diagnosis not present

## 2021-12-11 DIAGNOSIS — Z1152 Encounter for screening for COVID-19: Secondary | ICD-10-CM | POA: Diagnosis not present

## 2021-12-11 DIAGNOSIS — I509 Heart failure, unspecified: Secondary | ICD-10-CM

## 2021-12-11 DIAGNOSIS — Z882 Allergy status to sulfonamides status: Secondary | ICD-10-CM | POA: Diagnosis not present

## 2021-12-11 DIAGNOSIS — I878 Other specified disorders of veins: Secondary | ICD-10-CM | POA: Diagnosis present

## 2021-12-11 DIAGNOSIS — E1122 Type 2 diabetes mellitus with diabetic chronic kidney disease: Secondary | ICD-10-CM | POA: Diagnosis present

## 2021-12-11 DIAGNOSIS — I5043 Acute on chronic combined systolic (congestive) and diastolic (congestive) heart failure: Secondary | ICD-10-CM | POA: Diagnosis not present

## 2021-12-11 DIAGNOSIS — N179 Acute kidney failure, unspecified: Secondary | ICD-10-CM | POA: Diagnosis present

## 2021-12-11 LAB — ECHOCARDIOGRAM COMPLETE
AR max vel: 2.39 cm2
AV Area VTI: 2.22 cm2
AV Area mean vel: 2.3 cm2
AV Mean grad: 2 mmHg
AV Peak grad: 4.7 mmHg
Ao pk vel: 1.08 m/s
Calc EF: 30.1 %
Height: 63 in
S' Lateral: 3.5 cm
Single Plane A2C EF: 38 %
Single Plane A4C EF: 28.3 %
Weight: 2214.4 oz

## 2021-12-11 LAB — BASIC METABOLIC PANEL
Anion gap: 7 (ref 5–15)
BUN: 28 mg/dL — ABNORMAL HIGH (ref 8–23)
CO2: 22 mmol/L (ref 22–32)
Calcium: 10.5 mg/dL — ABNORMAL HIGH (ref 8.9–10.3)
Chloride: 106 mmol/L (ref 98–111)
Creatinine, Ser: 1.55 mg/dL — ABNORMAL HIGH (ref 0.44–1.00)
GFR, Estimated: 31 mL/min — ABNORMAL LOW (ref >60)
Glucose, Bld: 94 mg/dL (ref 70–99)
Potassium: 4.3 mmol/L (ref 3.5–5.1)
Sodium: 135 mmol/L (ref 135–145)

## 2021-12-11 LAB — CBC
HCT: 40.4 % (ref 36.0–46.0)
Hemoglobin: 13.6 g/dL (ref 12.0–15.0)
MCH: 29 pg (ref 26.0–34.0)
MCHC: 33.7 g/dL (ref 30.0–36.0)
MCV: 86.1 fL (ref 80.0–100.0)
Platelets: 206 10*3/uL (ref 150–400)
RBC: 4.69 MIL/uL (ref 3.87–5.11)
RDW: 13.1 % (ref 11.5–15.5)
WBC: 5.6 10*3/uL (ref 4.0–10.5)
nRBC: 0 % (ref 0.0–0.2)

## 2021-12-11 MED ORDER — LOPERAMIDE HCL 2 MG PO CAPS
2.0000 mg | ORAL_CAPSULE | Freq: Once | ORAL | Status: AC
  Administered 2021-12-11: 2 mg via ORAL
  Filled 2021-12-11: qty 1

## 2021-12-11 MED ORDER — FUROSEMIDE 10 MG/ML IJ SOLN
20.0000 mg | Freq: Every day | INTRAMUSCULAR | Status: DC
  Administered 2021-12-11: 20 mg via INTRAVENOUS

## 2021-12-11 NOTE — TOC Progression Note (Signed)
Transition of Care [facility]) - Progression Note    Patient Details  Name: Stephanie Atkinson MRN: [id number] Date of Birth: 04-18-1924  Transition of Care [facility]) CM/SW Contact  Zenon Mayo, RN Phone Number: 12/11/2021, 12:55 PM  Clinical Narrative:    From home alone, SOB , leg swelling, conts on IV lasix, for 2 d echo today, daughter is very supportive.  TOC following.         Expected Discharge Plan and Services                                                 Social Determinants of Health (SDOH) Interventions    Readmission Risk Interventions     No data to display

## 2021-12-11 NOTE — Progress Notes (Signed)
Heart Failure Navigator Progress Note  Assessed for Heart & Vascular TOC clinic readiness.  Patient does not meet criteria due to per Dr. Broadus John.     Earnestine Leys, BSN, Clinical cytogeneticist Only

## 2021-12-11 NOTE — Progress Notes (Signed)
Heart Failure Navigator Progress Note  Following this hospitalization to assess for HV TOC readiness.   Echo scheduled for 10/17 Last EF 50-55 % (5/21)  Earnestine Leys, BSN, RN Heart Failure Nurse Navigator Secure Chat Only

## 2021-12-11 NOTE — Progress Notes (Signed)
PROGRESS NOTE    Stephanie Atkinson  [id number] DOB: Jun 06, 1924 DOA: 12/09/2021 PCP: Deon Pilling, NP  Stephanie Atkinson is a 88 y.o. female with medical history significant of CAD s/p CABG, CKD stage 3, HTN, HLD, hypothyroidism, permanent atrial fibrillation, symptomatic bradycardia s/p PPM, diastolic CHF, venous stasis who presented to ED with complaints of shortness of breath, progressive over 1 to 2 weeks with orthopnea.  She has chronic lower extremity edema, takes Lasix and Aldactone for this, compliant with medications.  Labs in the ED noted stable vital signs, creatinine 1.5, BNP 184, chest x-ray with cardiomegaly   Subjective: -Feels better, breathing is improving, not back to baseline yet  Assessment and Plan:  Acute on chronic diastolic CHF (congestive heart failure) (HCC) -worsening shortness of breath and dyspnea on exertion with orthopnea, elevated BNP confirms patient is in acute on chronic -continue IV Lasix today, will decrease dose, she is 1.2 L negative -echo in 06/2019: EF of 50-55%. LVF with low normal function. Diastolic function indeterminate/ severely elevated left atrium, moderate right atrium  -Follow up repeat echo -continue home medication of avapro, sprironolactone  -Discussed low-sodium diet, she wears compression stockings  Permanent atrial fibrillation (HCC) Continue eliquis, decreased dose to 2.5mg  with age +renal function -on no rate controlling drugs   Symptomatic bradycardia s/p PPM  Interrogate PPM   CAD (coronary artery disease) of artery bypass graft -CAD s/p CABG x4 in 2006 with subsequent DES-PDA in July 2012. Repeat cardiac catheterization in 2014 showed stable coronary anatomy -continue medical management  Chronic kidney disease, stage 3 (HCC) Baseline 1.2-1.4, stable at 1.5 -Stable, monitor on diuretics  Hypertension -Discontinue amlodipine, continue other meds as above  Dyslipidemia Continue lipitor 80mg  daily   Venous stasis dermatitis  of both lower extremities Chronic edema Stable TED hose    DVT prophylaxis: Eliquis Code Status: Full code Family Communication: Discussed with patient detail, no family at bedside Disposition Plan: Home likely tomorrow  Consultants:    Procedures:   Antimicrobials:    Objective: Vitals:   12/10/21 2000 12/11/21 0024 12/11/21 0410 12/11/21 0737  BP: (!) 108/54 125/64 (!) 108/54 (!) 112/54  Pulse: 77 (!) 59  62  Resp: 18   17  Temp: 97.8 F (36.6 C) 97.7 F (36.5 C) 98.2 F (36.8 C) 98.2 F (36.8 C)  TempSrc: Oral Oral Oral Oral  SpO2: 96% 96% 96% 97%  Weight:   62.8 kg   Height:        Intake/Output Summary (Last 24 hours) at 12/11/2021 0939 Last data filed at 12/11/2021 [id number] Gross per 24 hour  Intake 360 ml  Output 1350 ml  Net -990 ml   Filed Weights   12/09/21 1855 12/10/21 1239 12/11/21 0410  Weight: 67.8 kg 61.4 kg 62.8 kg    Examination:  General exam: Pleasant elderly female sitting up in bed, AAOx3, no distress HEENT: + JVD CVS: S1-S2, regular rhythm Lungs: Decreased breath sounds at the bases Abdomen: Soft, nontender, bowel sounds present  extremities: Trace edema  Skin: No rashes Psychiatry:  Mood & affect appropriate.     Data Reviewed:   CBC: Recent Labs  Lab 12/09/21 1901 12/11/21 0040  WBC 5.4 5.6  HGB 12.3 13.6  HCT 37.8 40.4  MCV 87.3 86.1  PLT 198 [id number]   Basic Metabolic Panel: Recent Labs  Lab 12/09/21 1901 12/10/21 1509 12/11/21 0040  NA 133*  --  135  K 4.0  --  4.3  CL 102  --  106  CO2 23  --  22  GLUCOSE 85  --  94  BUN 29*  --  28*  CREATININE 1.51*  --  1.55*  CALCIUM 10.5*  --  10.5*  MG  --  2.0  --    GFR: Estimated Creatinine Clearance: 18 mL/min (A) (by C-G formula based on SCr of 1.55 mg/dL (H)). Liver Function Tests: No results for input(s): "AST", "ALT", "ALKPHOS", "BILITOT", "PROT", "ALBUMIN" in the last 168 hours. No results for input(s): "LIPASE", "AMYLASE" in the last 168 hours. No  results for input(s): "AMMONIA" in the last 168 hours. Coagulation Profile: No results for input(s): "INR", "PROTIME" in the last 168 hours. Cardiac Enzymes: No results for input(s): "CKTOTAL", "CKMB", "CKMBINDEX", "TROPONINI" in the last 168 hours. BNP (last 3 results) No results for input(s): "PROBNP" in the last 8760 hours. HbA1C: No results for input(s): "HGBA1C" in the last 72 hours. CBG: No results for input(s): "GLUCAP" in the last 168 hours. Lipid Profile: No results for input(s): "CHOL", "HDL", "LDLCALC", "TRIG", "CHOLHDL", "LDLDIRECT" in the last 72 hours. Thyroid Function Tests: Recent Labs    12/10/21 1509  TSH 1.602   Anemia Panel: No results for input(s): "VITAMINB12", "FOLATE", "FERRITIN", "TIBC", "IRON", "RETICCTPCT" in the last 72 hours. Urine analysis:    Component Value Date/Time   COLORURINE COLORLESS (A) 10/01/2021 1659   APPEARANCEUR CLEAR 10/01/2021 1659   LABSPEC 1.004 (L) 10/01/2021 1659   PHURINE 5.0 10/01/2021 1659   GLUCOSEU NEGATIVE 10/01/2021 1659   HGBUR NEGATIVE 10/01/2021 1659   BILIRUBINUR NEGATIVE 10/01/2021 1659   KETONESUR NEGATIVE 10/01/2021 1659   PROTEINUR NEGATIVE 10/01/2021 1659   UROBILINOGEN 1.0 10/14/2012 0922   NITRITE NEGATIVE 10/01/2021 1659   LEUKOCYTESUR TRACE (A) 10/01/2021 1659   Sepsis Labs: @LABRCNTIP (procalcitonin:4,lacticidven:4)  ) Recent Results (from the past 240 hour(s))  SARS Coronavirus 2 by RT PCR (hospital order, performed in  [facility] hospital lab) *cepheid single result test* Anterior Nasal Swab     Status: None   Collection Time: 12/09/21  9:02 PM   Specimen: Anterior Nasal Swab  Result Value Ref Range Status   SARS Coronavirus 2 by RT PCR NEGATIVE NEGATIVE Final    Comment: (NOTE) SARS-CoV-2 target nucleic acids are NOT DETECTED.  The SARS-CoV-2 RNA is generally detectable in upper and lower respiratory specimens during the acute phase of infection. The lowest concentration of SARS-CoV-2 viral  copies this assay can detect is 250 copies / mL. A negative result does not preclude SARS-CoV-2 infection and should not be used as the sole basis for treatment or other patient management decisions.  A negative result may occur with improper specimen collection / handling, submission of specimen other than nasopharyngeal swab, presence of viral mutation(s) within the areas targeted by this assay, and inadequate number of viral copies (<250 copies / mL). A negative result must be combined with clinical observations, patient history, and epidemiological information.  Fact Sheet for Patients:   RoadLapTop.co.za  Fact Sheet for Healthcare Providers: http://kim-miller.com/  This test is not yet approved or  cleared by the [place] FDA and has been authorized for detection and/or diagnosis of SARS-CoV-2 by FDA under an Emergency Use Authorization (EUA).  This EUA will remain in effect (meaning this test can be used) for the duration of the COVID-19 declaration under Section 564(b)(1) of the Act, 21 U.S.C. section 360bbb-3(b)(1), unless the authorization is terminated or revoked sooner.  Performed at [facility], [address], [place], [place] [address]  Radiology Studies: DG Chest Port 1 View  Result Date: 12/09/2021 CLINICAL DATA:  Chest pain and shortness of breath EXAM: PORTABLE CHEST 1 VIEW COMPARISON:  None Available. FINDINGS: Moderate cardiomegaly. Remote median sternotomy and pacemaker placement. No focal airspace consolidation or pulmonary edema. Advanced osteoarthrosis at both shoulders. IMPRESSION: Moderate cardiomegaly. No acute cardiopulmonary process. Electronically Signed   By: Ulyses Jarred M.D.   On: 12/09/2021 19:39     Scheduled Meds:  amLODipine  10 mg Oral Daily   apixaban  2.5 mg Oral BID   atorvastatin  80 mg Oral QHS   furosemide  20 mg Intravenous Daily   irbesartan  75 mg Oral  Daily   levothyroxine  50 mcg Oral QAC breakfast   sodium chloride flush  3 mL Intravenous Q12H   spironolactone  25 mg Oral Daily   Continuous Infusions:  sodium chloride       LOS: 0 days    Time spent: 16min  Domenic Polite, MD [facility]   12/11/2021, 9:39 AM

## 2021-12-12 DIAGNOSIS — I5033 Acute on chronic diastolic (congestive) heart failure: Secondary | ICD-10-CM | POA: Diagnosis not present

## 2021-12-12 DIAGNOSIS — I5043 Acute on chronic combined systolic (congestive) and diastolic (congestive) heart failure: Secondary | ICD-10-CM | POA: Diagnosis not present

## 2021-12-12 DIAGNOSIS — I4821 Permanent atrial fibrillation: Secondary | ICD-10-CM | POA: Diagnosis not present

## 2021-12-12 DIAGNOSIS — N179 Acute kidney failure, unspecified: Secondary | ICD-10-CM | POA: Diagnosis not present

## 2021-12-12 LAB — BASIC METABOLIC PANEL
Anion gap: 7 (ref 5–15)
BUN: 37 mg/dL — ABNORMAL HIGH (ref 8–23)
CO2: 24 mmol/L (ref 22–32)
Calcium: 10.4 mg/dL — ABNORMAL HIGH (ref 8.9–10.3)
Chloride: 101 mmol/L (ref 98–111)
Creatinine, Ser: 2.15 mg/dL — ABNORMAL HIGH (ref 0.44–1.00)
GFR, Estimated: 21 mL/min — ABNORMAL LOW (ref >60)
Glucose, Bld: 97 mg/dL (ref 70–99)
Potassium: 4.1 mmol/L (ref 3.5–5.1)
Sodium: 132 mmol/L — ABNORMAL LOW (ref 135–145)

## 2021-12-12 MED ORDER — CARVEDILOL 3.125 MG PO TABS
3.1250 mg | ORAL_TABLET | Freq: Two times a day (BID) | ORAL | Status: DC
  Administered 2021-12-12 – 2021-12-13 (×2): 3.125 mg via ORAL
  Filled 2021-12-12 (×2): qty 1

## 2021-12-12 NOTE — Progress Notes (Signed)
PROGRESS NOTE    Stephanie Atkinson  KGM:[id number] DOB: 1924-02-28 DOA: 12/09/2021 PCP: Deon Pilling, NP  Stephanie Atkinson is a 88 y.o. female with medical history significant of CAD s/p CABG, CKD stage 3, HTN, HLD, hypothyroidism, permanent atrial fibrillation, symptomatic bradycardia s/p PPM, diastolic CHF, venous stasis who presented to ED with complaints of shortness of breath, progressive over 1 to 2 weeks with orthopnea.  She has chronic lower extremity edema, takes Lasix and Aldactone for this, compliant with medications.  Labs in the ED noted stable vital signs, creatinine 1.5, chest x-ray with cardiomegaly   Subjective: -Feels much better overall, breathing is back to baseline, denies any orthopnea, swelling has gone  Assessment and Plan:  Acute on chronic diastolic CHF (congestive heart failure) (HCC) -worsening shortness of breath and dyspnea on exertion with orthopnea, and edema -echo in 06/2019: EF of 50-55%. LVF with low normal function. Diastolic function indeterminate/ severely elevated left atrium, moderate right atrium  -Repeat echo with EF down to 30-35%, significantly decreased RV function and septal pacer dyssynchrony, anticipate conservative management for cardiomyopathy -Will request cardiology input, she is followed by Dr. Gwenlyn Found -Clinically much improved, with gentle diuresis, urine output inaccurate in the ED, creatinine has bumped to 2.2 will hold Lasix, Aldactone and valsartan today(on Lasix, valsartan 80 Mg and Aldactone 20 Mg at home) -Edema has essentially resolved, advised to wear compression stockings  Permanent atrial fibrillation ([place]) Continue eliquis, decreased dose to 2.5mg  with age +renal function -on no rate controlling drugs   Symptomatic bradycardia s/p PPM   CAD (coronary artery disease) of artery bypass graft -CAD s/p CABG x4 in 2006 with subsequent DES-PDA in July 2012. Repeat cardiac catheterization in 2014 showed stable coronary anatomy -continue  medical management  AKI chronic kidney disease, stage 3 ([place]) Baseline 1.2-1.4, stable at 1.5 -See discussion above, holding ARB and diuretics today  Hypertension -Stable, see meds as above  Dyslipidemia Continue lipitor 80mg  daily   Venous stasis dermatitis of both lower extremities Chronic edema Stable TED hose    DVT prophylaxis: Eliquis Code Status: Full code Family Communication: Discussed with patient detail, no family at bedside Disposition Plan: Home likely 1 to 2 days  Consultants:    Procedures:   Antimicrobials:    Objective: Vitals:   12/11/21 1508 12/11/21 2127 12/12/21 0413 12/12/21 0714  BP: (!) 112/59 (!) 123/56 127/63 114/64  Pulse: 63  64 60  Resp: 18 16 18 18   Temp: 97.6 F (36.4 C) 98.2 F (36.8 C) 97.8 F (36.6 C) 98.4 F (36.9 C)  TempSrc: Oral Oral Oral Oral  SpO2: 99% 99% 98% 97%  Weight:   61.9 kg   Height:        Intake/Output Summary (Last 24 hours) at 12/12/2021 0930 Last data filed at 12/12/2021 6440 Gross per 24 hour  Intake 480 ml  Output 700 ml  Net -220 ml   Filed Weights   12/10/21 1239 12/11/21 0410 12/12/21 0413  Weight: 61.4 kg 62.8 kg 61.9 kg    Examination:  General exam: Pleasant elderly female sitting up in bed, AAOx3, no distress HEENT: No JVD CVS: S1-S2, regular rhythm Lungs: Clear bilaterally Abdomen: Soft, nontender, bowel sounds present Extremities: No edema today Skin: No rashes Psychiatry:  Mood & affect appropriate.     Data Reviewed:   CBC: Recent Labs  Lab 12/09/21 1901 12/11/21 0040  WBC 5.4 5.6  HGB 12.3 13.6  HCT 37.8 40.4  MCV 87.3 86.1  PLT 198 206  Basic Metabolic Panel: Recent Labs  Lab 12/09/21 1901 12/10/21 1509 12/11/21 0040 12/12/21 0051  NA 133*  --  135 132*  K 4.0  --  4.3 4.1  CL 102  --  106 101  CO2 23  --  22 24  GLUCOSE 85  --  94 97  BUN 29*  --  28* 37*  CREATININE 1.51*  --  1.55* 2.15*  CALCIUM 10.5*  --  10.5* 10.4*  MG  --  2.0  --   --     GFR: Estimated Creatinine Clearance: 12.9 mL/min (A) (by C-G formula based on SCr of 2.15 mg/dL (H)). Liver Function Tests: No results for input(s): "AST", "ALT", "ALKPHOS", "BILITOT", "PROT", "ALBUMIN" in the last 168 hours. No results for input(s): "LIPASE", "AMYLASE" in the last 168 hours. No results for input(s): "AMMONIA" in the last 168 hours. Coagulation Profile: No results for input(s): "INR", "PROTIME" in the last 168 hours. Cardiac Enzymes: No results for input(s): "CKTOTAL", "CKMB", "CKMBINDEX", "TROPONINI" in the last 168 hours. BNP (last 3 results) No results for input(s): "PROBNP" in the last 8760 hours. HbA1C: No results for input(s): "HGBA1C" in the last 72 hours. CBG: No results for input(s): "GLUCAP" in the last 168 hours. Lipid Profile: No results for input(s): "CHOL", "HDL", "LDLCALC", "TRIG", "CHOLHDL", "LDLDIRECT" in the last 72 hours. Thyroid Function Tests: Recent Labs    12/10/21 1509  TSH 1.602   Anemia Panel: No results for input(s): "VITAMINB12", "FOLATE", "FERRITIN", "TIBC", "IRON", "RETICCTPCT" in the last 72 hours. Urine analysis:    Component Value Date/Time   COLORURINE COLORLESS (A) 10/01/2021 1659   APPEARANCEUR CLEAR 10/01/2021 1659   LABSPEC 1.004 (L) 10/01/2021 1659   PHURINE 5.0 10/01/2021 1659   GLUCOSEU NEGATIVE 10/01/2021 1659   HGBUR NEGATIVE 10/01/2021 1659   BILIRUBINUR NEGATIVE 10/01/2021 1659   KETONESUR NEGATIVE 10/01/2021 1659   PROTEINUR NEGATIVE 10/01/2021 1659   UROBILINOGEN 1.0 10/14/2012 0922   NITRITE NEGATIVE 10/01/2021 1659   LEUKOCYTESUR TRACE (A) 10/01/2021 1659   Sepsis Labs: @LABRCNTIP (procalcitonin:4,lacticidven:4)  ) Recent Results (from the past 240 hour(s))  SARS Coronavirus 2 by RT PCR (hospital order, performed in [place] hospital lab) *cepheid single result test* Anterior Nasal Swab     Status: None   Collection Time: 12/09/21  9:02 PM   Specimen: Anterior Nasal Swab  Result Value Ref Range  Status   SARS Coronavirus 2 by RT PCR NEGATIVE NEGATIVE Final    Comment: (NOTE) SARS-CoV-2 target nucleic acids are NOT DETECTED.  The SARS-CoV-2 RNA is generally detectable in upper and lower respiratory specimens during the acute phase of infection. The lowest concentration of SARS-CoV-2 viral copies this assay can detect is 250 copies / mL. A negative result does not preclude SARS-CoV-2 infection and should not be used as the sole basis for treatment or other patient management decisions.  A negative result may occur with improper specimen collection / handling, submission of specimen other than nasopharyngeal swab, presence of viral mutation(s) within the areas targeted by this assay, and inadequate number of viral copies (<250 copies / mL). A negative result must be combined with clinical observations, patient history, and epidemiological information.  Fact Sheet for Patients:   https://www.patel.info/  Fact Sheet for Healthcare Providers: https://hall.com/  This test is not yet approved or  cleared by the [place] FDA and has been authorized for detection and/or diagnosis of SARS-CoV-2 by FDA under an Emergency Use Authorization (EUA).  This EUA will remain in effect (  meaning this test can be used) for the duration of the COVID-19 declaration under Section 564(b)(1) of the Act, 21 U.S.C. section 360bbb-3(b)(1), unless the authorization is terminated or revoked sooner.  Performed at [facility], [address], [place], [place] [address]      Radiology Studies: ECHOCARDIOGRAM COMPLETE  Result Date: 12/11/2021    ECHOCARDIOGRAM REPORT   Patient Name:   TIANNA BASTIANELLI Date of Exam: 12/11/2021 Medical Rec #:  [id number]     Height:       63.0 in Accession #:    [id number]    Weight:       138.4 lb Date of Birth:  1924-06-12     BSA:          1.654 m Patient Age:    89 years      BP:           101/54 mmHg  Patient Gender: F             HR:           61 bpm. Exam Location:  Inpatient Procedure: 2D Echo, Cardiac Doppler and Color Doppler Indications:    CHF-Acute Diastolic [id number]  History:        Patient has prior history of Echocardiogram examinations, most                 recent 06/26/2019. CAD, Pacemaker, Arrythmias:Atrial Fibrillation                 and Bradycardia, Signs/Symptoms:Syncope; Risk                 Factors:Hypertension and Dyslipidemia.  Sonographer:    Ronny Flurry Referring Phys: [id number] ALLISON WOLFE  Sonographer Comments: Technically difficult study due to poor echo windows. IMPRESSIONS  1. Significant septal pacer dysynchrony. Left ventricular ejection fraction, by estimation, is 30 to 35%. Left ventricular ejection fraction by 2D MOD biplane is 30.1 %. The left ventricle has moderately decreased function. The left ventricle demonstrates global hypokinesis. Left ventricular diastolic function could not be evaluated.  2. Right ventricular systolic function is severely reduced. The right ventricular size is normal.  3. Left atrial size was severely dilated.  4. Right atrial size was severely dilated.  5. The mitral valve is grossly normal. Mild mitral valve regurgitation.  6. The tricuspid valve is abnormal. Tricuspid valve regurgitation is mild to moderate.  7. The aortic valve is tricuspid. Aortic valve regurgitation is not visualized. Comparison(s): Changes from prior study are noted. 06/26/2019: LVEF 50-55%. FINDINGS  Left Ventricle: Significant septal pacer dysynchrony. Left ventricular ejection fraction, by estimation, is 30 to 35%. Left ventricular ejection fraction by 2D MOD biplane is 30.1 %. The left ventricle has moderately decreased function. The left ventricle demonstrates global hypokinesis. The left ventricular internal cavity size was normal in size. There is no left ventricular hypertrophy. Abnormal (paradoxical) septal motion, consistent with RV pacemaker. Left ventricular diastolic  function could not be evaluated due to atrial fibrillation. Left ventricular diastolic function could not be evaluated. Right Ventricle: The right ventricular size is normal. No increase in right ventricular wall thickness. Right ventricular systolic function is severely reduced. Left Atrium: Left atrial size was severely dilated. Right Atrium: Right atrial size was severely dilated. Pericardium: There is no evidence of pericardial effusion. Mitral Valve: The mitral valve is grossly normal. Mild mitral valve regurgitation. Tricuspid Valve: The tricuspid valve is abnormal. Tricuspid valve regurgitation is mild to moderate. Aortic Valve: The aortic valve is  tricuspid. Aortic valve regurgitation is not visualized. Aortic valve mean gradient measures 2.0 mmHg. Aortic valve peak gradient measures 4.7 mmHg. Aortic valve area, by VTI measures 2.22 cm. Pulmonic Valve: The pulmonic valve was grossly normal. Pulmonic valve regurgitation is trivial. Aorta: The aortic root and ascending aorta are structurally normal, with no evidence of dilitation. Venous: The inferior vena cava was not well visualized. IAS/Shunts: No atrial level shunt detected by color flow Doppler. Additional Comments: A device lead is visualized.  LEFT VENTRICLE PLAX 2D                        Biplane EF (MOD) LVIDd:         4.60 cm         LV Biplane EF:   Left LVIDs:         3.50 cm                          ventricular LV PW:         1.00 cm                          ejection LV IVS:        1.30 cm                          fraction by LVOT diam:     2.00 cm                          2D MOD LV SV:         50                               biplane is LV SV Index:   30                               30.1 %. LVOT Area:     3.14 cm                                Diastology                                LV e' medial:    4.19 cm/s LV Volumes (MOD)               LV E/e' medial:  20.5 LV vol d, MOD    83.2 ml       LV e' lateral:   8.38 cm/s A2C:                            LV E/e' lateral: 10.2 LV vol d, MOD    95.9 ml A4C: LV vol s, MOD    51.6 ml A2C: LV vol s, MOD    68.8 ml A4C: LV SV MOD A2C:   31.6 ml LV SV MOD A4C:   95.9 ml LV SV MOD BP:    26.9 ml RIGHT VENTRICLE RV S prime:     4.40 cm/s LEFT ATRIUM  Index        RIGHT ATRIUM           Index LA diam:        4.80 cm  2.90 cm/m   RA Area:     28.00 cm LA Vol (A2C):   77.0 ml  46.57 ml/m  RA Volume:   90.40 ml  54.67 ml/m LA Vol (A4C):   108.0 ml 65.31 ml/m LA Biplane Vol: 96.2 ml  58.18 ml/m  AORTIC VALVE AV Area (Vmax):    2.39 cm AV Area (Vmean):   2.30 cm AV Area (VTI):     2.22 cm AV Vmax:           108.00 cm/s AV Vmean:          69.800 cm/s AV VTI:            0.225 m AV Peak Grad:      4.7 mmHg AV Mean Grad:      2.0 mmHg LVOT Vmax:         82.30 cm/s LVOT Vmean:        51.100 cm/s LVOT VTI:          0.159 m LVOT/AV VTI ratio: 0.71  AORTA Ao Root diam: 2.90 cm Ao Asc diam:  3.00 cm MV E velocity: 85.70 cm/s  TRICUSPID VALVE                            TR Peak grad:   22.3 mmHg                            TR Vmax:        236.00 cm/s                             SHUNTS                            Systemic VTI:  0.16 m                            Systemic Diam: 2.00 cm Lyman Bishop MD Electronically signed by Lyman Bishop MD Signature Date/Time: 12/11/2021/3:14:02 PM    Final      Scheduled Meds:  apixaban  2.5 mg Oral BID   atorvastatin  80 mg Oral QHS   levothyroxine  50 mcg Oral QAC breakfast   sodium chloride flush  3 mL Intravenous Q12H   Continuous Infusions:  sodium chloride       LOS: 1 day    Time spent: 28min  Domenic Polite, MD [facility]   12/12/2021, 9:30 AM

## 2021-12-12 NOTE — Consult Note (Addendum)
Cardiology Consultation   Patient ID: Stephanie Atkinson MRN: [id number]; DOB: 11-05-1924  Admit date: 12/09/2021 Date of Consult: 12/12/2021  PCP:  Deon Pilling, NP   [facility] Cardiologist:  Quay Burow, MD     Patient Profile:   Stephanie Atkinson is a 88 y.o. female with a hx of CAD status post CABG x4 '06, s/p DES to PDA '12, paroxysmal atrial fibrillation, syncope status post loop implant '14, symptomatic bradycardia s/p PPM '19, hypertension, hyperlipidemia, CKD stage III, hypothyroidism who is being seen 12/12/2021 for the evaluation of CHF at the request of Dr. Broadus John.  History of Present Illness:   Ms. Stephanie Atkinson is a 88 year old female with past medical history noted above.  She has been followed by Dr. Alvester Chou as an outpatient.  She underwent four-vessel CABG (LIMA to LAD, RIMA to PDA, SVG to OM1 SVG to OM 2) in 2006 with subsequent DES to PDA in July 2012.  Follow-up cath in October 2012 showed patent stent.  Had repeat cardiac catheterization in 2014 with unchanged coronary anatomy with medical therapy recommended.  She has a history of paroxysmal atrial fibrillation since the time of her CABG.  She had previously been treated with amiodarone however this was discontinued in the setting of prolonged PR interval.  On Eliquis for chronic anticoagulation.  At syncope 09/2012 and underwent loop recorder implantation.  Was later noted to have increased fatigue and a slow ventricular response with her atrial fibrillation despite being on no AV nodal agents.  She underwent PPM implantation with Dr. Sallyanne Kuster 06/2017.   Echocardiogram 06-2019 with LVEF of 50 to 55%, septal dyssynchrony due to ventricular pacing, severe left atrial enlargement, moderate right atrial enlargement, mild MR and TR, normal RV size and function.  Seen in the office 04/2021 and overall doing well from a cardiac standpoint.  Her blood pressure was noted to be slightly elevated and she was resumed on  valsartan.  She was seen in the hypertension clinic a month later and medications were adjusted to include a combination of valsartan, amlodipine and spironolactone.  She was advised to take her amlodipine in the evenings.  Last remote transmission 09/2021 with 99.49% V pacing but not pacemaker dependent at last in person check.   He was last seen in the office 11/05/2021 with Diona Browner, NP at which time she was accompanied by her daughter.  Noted she had had some issues with generalized musculoskeletal pain in the setting of lumbar spondylosis and was following with orthopedics.  She had received an injection of steroids with improvement.  She was continued on amlodipine, valsartan, spironolactone, Lasix, Lipitor as well as Eliquis.  Presented to the ED on 10/15 with complaints of increasing shortness of breath over the past week or so prior to admission. Had lots of sinus drainage. + orthopnea and PND with some LE edema. Reports she actually doubled her lasix several days before presenting to the ED and her UOP had been decent. Also noticed increased dyspnea on exertion with simple daily activities.    Labs on admission showed sodium 133, potassium 4, creatinine 1.5, magnesium 2, BNP 184, high-sensitivity troponin 12>>13, WBC 5.4, hemoglobin 12.3.  EKG V paced.  Chest x-ray with moderate cardiomegaly.  She was admitted to internal medicine for further management and diuresis.  Echocardiogram 10/17 with notable decline in LV function of 30 to 35% global hypokinesis, severely reduced RV function, normal RV size, severe biatrial enlargement, mild MR. Cardiology now asked to evaluate.  Past Medical History:  Diagnosis Date   Chronic kidney disease, stage 3 (HCC)    Coronary artery disease    Hx of CABG 2006   LIMA-LAD, free RIMA-PDA, Radial-OM1-OM2   Hyperlipidemia    Hypertension    Hypothyroid    MI, old        PAF (paroxysmal atrial fibrillation) (HCC)    was on amio, d/c'd due to prolonged  PR interval, rate control   Presence of permanent cardiac pacemaker 07/16/2017   Presence of stent in right coronary artery 07/12   RIMA-PDA occluded, DES stent placed   Syncope 07/12   bradycardic, beta blocker decreased, also felt to be dehydrated    Past Surgical History:  Procedure Laterality Date   CARDIAC CATHETERIZATION  [id number]   dr. Shanon Brow harding, revealing a atretic bypass to her right with patent distal RCA stent   CAROTID STENT  2012   CORONARY ARTERY BYPASS GRAFT  2006   LIMA-LAD, free RIMA-PDA, Radial-OM1-OM2   DOPPLER ECHOCARDIOGRAPHY  [id number]   mild asymmetric left ventricular hypertrophy, left ventricular systolic function is low normal, ejection fraction = 50-55%, the LA is moderate dilated, the RA is mildly dilated, no significant valvular disease   INSERT / REPLACE / REMOVE PACEMAKER  07/16/2017   JOINT REPLACEMENT     hip replacement   LEFT HEART CATHETERIZATION WITH CORONARY/GRAFT ANGIOGRAM N/A 10/12/2012   Procedure: LEFT HEART CATHETERIZATION WITH Beatrix Fetters;  Surgeon: Leonie Man, MD;  Location: [facility] CATH LAB;  Service: Cardiovascular;  Laterality: N/A;   LOOP RECORDER IMPLANT N/A 10/15/2012   Procedure: LOOP RECORDER IMPLANT;  Surgeon: Sanda Klein, MD;  Location: [place] CATH LAB;  Service: Cardiovascular;  Laterality: N/A;   LOOP RECORDER REMOVAL  07/16/2017   LOOP RECORDER REMOVAL N/A 07/16/2017   Procedure: LOOP RECORDER REMOVAL;  Surgeon: Sanda Klein, MD;  Location: [facility] CV LAB;  Service: Cardiovascular;  Laterality: N/A;   NM MYOVIEW LTD  [id number]   post stress left ventricle is normal in size, post stress ejection fraction is 67% global left ventricular systolic function is normal, normal myocardial perfusion study, abnormal myocardial perfusion study, low risk scan   PACEMAKER IMPLANT N/A 07/16/2017   Procedure: PACEMAKER IMPLANT;  Surgeon: Sanda Klein, MD;  Location: [place] CV LAB;  Service: Cardiovascular;  Laterality: N/A;    THYROIDECTOMY       Home Medications:  Prior to Admission medications   Medication Sig Start Date End Date Taking? Authorizing Provider  acetaminophen (TYLENOL) 500 MG tablet Take 1 tablet (500 mg total) by mouth every 6 (six) hours as needed for moderate pain. Patient taking differently: Take 1,000 mg by mouth every 6 (six) hours as needed for moderate pain. 06/27/19  Yes Alma Friendly, MD  amLODipine (NORVASC) 10 MG tablet TAKE 1 TABLET BY MOUTH EVERY DAY Patient taking differently: Take 10 mg by mouth daily. 03/27/21  Yes Lorretta Harp, MD  apixaban (ELIQUIS) 5 MG TABS tablet TAKE 1 TABLET BY MOUTH TWICE A DAY Patient taking differently: Take 5 mg by mouth 2 (two) times daily. 09/12/21  Yes Lorretta Harp, MD  atorvastatin (LIPITOR) 80 MG tablet Take 80 mg by mouth at bedtime.    Yes [provider]  cyclobenzaprine (FLEXERIL) 10 MG tablet Take 5-10 mg by mouth daily as needed for muscle spasms. 05/30/16  Yes [provider]  diphenhydrAMINE (BENADRYL) 25 MG tablet Take 25 mg by mouth every 6 (six) hours as needed for allergies.  Yes [provider]  furosemide (LASIX) 20 MG tablet TAKE 1 TABLET BY MOUTH EVERY DAY Patient taking differently: Take 20 mg by mouth daily. 02/12/21  Yes Lorretta Harp, MD  irbesartan (AVAPRO) 75 MG tablet Take 75 mg by mouth daily.   Yes [provider]  levothyroxine (SYNTHROID, LEVOTHROID) 50 MCG tablet Take 50 mcg by mouth daily before breakfast.    Yes [provider]  lidocaine (LIDODERM) 5 % Place 1 patch onto the skin daily. Remove & Discard patch within 12 hours or as directed by MD Patient taking differently: Place 1 patch onto the skin daily as needed (pain). 10/01/21  Yes Loni Beckwith, PA-C  nitroGLYCERIN (NITROSTAT) 0.4 MG SL tablet Place 1 tablet (0.4 mg total) under the tongue every 5 (five) minutes as needed for chest pain. 05/07/21  Yes Lorretta Harp, MD  Polyethyl Glycol-Propyl  Glycol (SYSTANE OP) Place 1 drop into both eyes daily as needed (dry eye).   Yes [provider]  spironolactone (ALDACTONE) 25 MG tablet TAKE 1 TABLET (25 MG TOTAL) BY MOUTH DAILY. Patient taking differently: Take 25 mg by mouth daily. 07/03/21  Yes Lorretta Harp, MD  traMADol (ULTRAM) 50 MG tablet Take 50 mg by mouth 2 (two) times daily.   Yes [provider]  valsartan (DIOVAN) 80 MG tablet Take 1 tablet (80 mg total) by mouth daily. 09/12/21  Yes Lorretta Harp, MD    Inpatient Medications: Scheduled Meds:  apixaban  2.5 mg Oral BID   atorvastatin  80 mg Oral QHS   levothyroxine  50 mcg Oral QAC breakfast   sodium chloride flush  3 mL Intravenous Q12H   Continuous Infusions:  sodium chloride     PRN Meds: sodium chloride, acetaminophen **OR** acetaminophen, polyvinyl alcohol, sodium chloride flush, traMADol  Allergies:    Allergies  Allergen Reactions   Penicillins Hives    Did it involve swelling of the face/tongue/throat, SOB, or low BP? No Did it involve sudden or severe rash/hives, skin peeling, or any reaction on the inside of your mouth or nose? No Did you need to seek medical attention at a hospital or doctor's office? No When did it last happen? Unk    If all above answers are "NO", may proceed with cephalosporin use.    Sulfa Antibiotics Hives and Swelling    Social History:   Social History   Socioeconomic History   Marital status: Single    Spouse name: Not on file   Number of children: Not on file   Years of education: Not on file   Highest education level: Not on file  Occupational History   Not on file  Tobacco Use   Smoking status: Never   Smokeless tobacco: Current    Types: Snuff  Vaping Use   Vaping Use: Never used  Substance and Sexual Activity   Alcohol use: No   Drug use: No   Sexual activity: Not on file  Other Topics Concern   Not on file  Social History Narrative   Not on file   Social Determinants of Health    Financial Resource Strain: Not on file  Food Insecurity: Not on file  Transportation Needs: Not on file  Physical Activity: Not on file  Stress: Not on file  Social Connections: Not on file  Intimate Partner Violence: Not on file    Family History:    Family History  Problem Relation Age of Onset   CAD Mother  CAD Maternal Grandmother      ROS:  Please see the history of present illness.   All other ROS reviewed and negative.     Physical Exam/Data:   Vitals:   12/12/21 0714 12/12/21 1106 12/12/21 1139 12/12/21 1140  BP: 114/64 (!) 131/59 127/69   Pulse: 60 63 (!) 59   Resp: 18 19 18    Temp: 98.4 F (36.9 C)   97.6 F (36.4 C)  TempSrc: Oral Oral  Oral  SpO2: 97% 97% 96%   Weight:      Height:        Intake/Output Summary (Last 24 hours) at 12/12/2021 1459 Last data filed at 12/12/2021 1259 Gross per 24 hour  Intake 838 ml  Output 600 ml  Net 238 ml      12/12/2021    4:13 AM 12/11/2021    4:10 AM 12/10/2021   12:39 PM  Last 3 Weights  Weight (lbs) 136 lb 8 oz 138 lb 6.4 oz 135 lb 5.8 oz  Weight (kg) 61.916 kg 62.778 kg 61.4 kg     Body mass index is 24.18 kg/m.  General:  Very pleasant older female, sitting up in the chair HEENT: normal Neck: no JVD Vascular: No carotid bruits; Distal pulses 2+ bilaterally Cardiac:  normal S1, S2; RRR; soft systolic murmur  Lungs:  clear to auscultation bilaterally, no wheezing, rhonchi or rales  Abd: soft, nontender, no hepatomegaly  Ext: no edema Musculoskeletal:  No deformities, BUE and BLE strength normal and equal Skin: warm and dry  Neuro:  CNs 2-12 intact, no focal abnormalities noted Psych:  Normal affect   EKG:  The EKG was personally reviewed and demonstrates:  V-paced  Telemetry:  Telemetry was personally reviewed and demonstrates:  V paced  Relevant CV Studies:  Echo: 12/11/2021  IMPRESSIONS     1. Significant septal pacer dysynchrony. Left ventricular ejection  fraction, by estimation,  is 30 to 35%. Left ventricular ejection fraction  by 2D MOD biplane is 30.1 %. The left ventricle has moderately decreased  function. The left ventricle  demonstrates global hypokinesis. Left ventricular diastolic function could  not be evaluated.   2. Right ventricular systolic function is severely reduced. The right  ventricular size is normal.   3. Left atrial size was severely dilated.   4. Right atrial size was severely dilated.   5. The mitral valve is grossly normal. Mild mitral valve regurgitation.   6. The tricuspid valve is abnormal. Tricuspid valve regurgitation is mild  to moderate.   7. The aortic valve is tricuspid. Aortic valve regurgitation is not  visualized.   Comparison(s): Changes from prior study are noted. 06/26/2019: LVEF 50-55%.   FINDINGS   Left Ventricle: Significant septal pacer dysynchrony. Left ventricular  ejection fraction, by estimation, is 30 to 35%. Left ventricular ejection  fraction by 2D MOD biplane is 30.1 %. The left ventricle has moderately  decreased function. The left  ventricle demonstrates global hypokinesis. The left ventricular internal  cavity size was normal in size. There is no left ventricular hypertrophy.  Abnormal (paradoxical) septal motion, consistent with RV pacemaker. Left  ventricular diastolic function  could not be evaluated due to atrial fibrillation. Left ventricular  diastolic function could not be evaluated.   Right Ventricle: The right ventricular size is normal. No increase in  right ventricular wall thickness. Right ventricular systolic function is  severely reduced.   Left Atrium: Left atrial size was severely dilated.   Right Atrium: Right atrial  size was severely dilated.   Pericardium: There is no evidence of pericardial effusion.   Mitral Valve: The mitral valve is grossly normal. Mild mitral valve  regurgitation.   Tricuspid Valve: The tricuspid valve is abnormal. Tricuspid valve  regurgitation is mild to  moderate.   Aortic Valve: The aortic valve is tricuspid. Aortic valve regurgitation is  not visualized. Aortic valve mean gradient measures 2.0 mmHg. Aortic valve  peak gradient measures 4.7 mmHg. Aortic valve area, by VTI measures 2.22  cm.   Pulmonic Valve: The pulmonic valve was grossly normal. Pulmonic valve  regurgitation is trivial.   Aorta: The aortic root and ascending aorta are structurally normal, with  no evidence of dilitation.   Venous: The inferior vena cava was not well visualized.   IAS/Shunts: No atrial level shunt detected by color flow Doppler.   Additional Comments: A device lead is visualized.   Laboratory Data:  High Sensitivity Troponin:   Recent Labs  Lab 12/09/21 1901 12/09/21 2102  TROPONINIHS 12 13     Chemistry Recent Labs  Lab 12/09/21 1901 12/10/21 1509 12/11/21 0040 12/12/21 0051  NA 133*  --  135 132*  K 4.0  --  4.3 4.1  CL 102  --  106 101  CO2 23  --  22 24  GLUCOSE 85  --  94 97  BUN 29*  --  28* 37*  CREATININE 1.51*  --  1.55* 2.15*  CALCIUM 10.5*  --  10.5* 10.4*  MG  --  2.0  --   --   GFRNONAA 32*  --  31* 21*  ANIONGAP 8  --  7 7    No results for input(s): "PROT", "ALBUMIN", "AST", "ALT", "ALKPHOS", "BILITOT" in the last 168 hours. Lipids No results for input(s): "CHOL", "TRIG", "HDL", "LABVLDL", "LDLCALC", "CHOLHDL" in the last 168 hours.  Hematology Recent Labs  Lab 12/09/21 1901 12/11/21 0040  WBC 5.4 5.6  RBC 4.33 4.69  HGB 12.3 13.6  HCT 37.8 40.4  MCV 87.3 86.1  MCH 28.4 29.0  MCHC 32.5 33.7  RDW 13.2 13.1  PLT 198 206   Thyroid  Recent Labs  Lab 12/10/21 1509  TSH 1.602    BNP Recent Labs  Lab 12/09/21 1901  BNP 184.4*    DDimer No results for input(s): "DDIMER" in the last 168 hours.   Radiology/Studies:  ECHOCARDIOGRAM COMPLETE  Result Date: 12/11/2021    ECHOCARDIOGRAM REPORT   Patient Name:   Stephanie Atkinson Date of Exam: 12/11/2021 Medical Rec #:  [id number]     Height:       63.0  in Accession #:    [id number]    Weight:       138.4 lb Date of Birth:  April 17, 1924     BSA:          1.654 m Patient Age:    89 years      BP:           101/54 mmHg Patient Gender: F             HR:           61 bpm. Exam Location:  Inpatient Procedure: 2D Echo, Cardiac Doppler and Color Doppler Indications:    CHF-Acute Diastolic [id number]  History:        Patient has prior history of Echocardiogram examinations, most                 recent 06/26/2019. CAD, Pacemaker, Arrythmias:Atrial  Fibrillation                 and Bradycardia, Signs/Symptoms:Syncope; Risk                 Factors:Hypertension and Dyslipidemia.  Sonographer:    Ronny Flurry Referring Phys: [id number] ALLISON WOLFE  Sonographer Comments: Technically difficult study due to poor echo windows. IMPRESSIONS  1. Significant septal pacer dysynchrony. Left ventricular ejection fraction, by estimation, is 30 to 35%. Left ventricular ejection fraction by 2D MOD biplane is 30.1 %. The left ventricle has moderately decreased function. The left ventricle demonstrates global hypokinesis. Left ventricular diastolic function could not be evaluated.  2. Right ventricular systolic function is severely reduced. The right ventricular size is normal.  3. Left atrial size was severely dilated.  4. Right atrial size was severely dilated.  5. The mitral valve is grossly normal. Mild mitral valve regurgitation.  6. The tricuspid valve is abnormal. Tricuspid valve regurgitation is mild to moderate.  7. The aortic valve is tricuspid. Aortic valve regurgitation is not visualized. Comparison(s): Changes from prior study are noted. 06/26/2019: LVEF 50-55%. FINDINGS  Left Ventricle: Significant septal pacer dysynchrony. Left ventricular ejection fraction, by estimation, is 30 to 35%. Left ventricular ejection fraction by 2D MOD biplane is 30.1 %. The left ventricle has moderately decreased function. The left ventricle demonstrates global hypokinesis. The left ventricular internal  cavity size was normal in size. There is no left ventricular hypertrophy. Abnormal (paradoxical) septal motion, consistent with RV pacemaker. Left ventricular diastolic function could not be evaluated due to atrial fibrillation. Left ventricular diastolic function could not be evaluated. Right Ventricle: The right ventricular size is normal. No increase in right ventricular wall thickness. Right ventricular systolic function is severely reduced. Left Atrium: Left atrial size was severely dilated. Right Atrium: Right atrial size was severely dilated. Pericardium: There is no evidence of pericardial effusion. Mitral Valve: The mitral valve is grossly normal. Mild mitral valve regurgitation. Tricuspid Valve: The tricuspid valve is abnormal. Tricuspid valve regurgitation is mild to moderate. Aortic Valve: The aortic valve is tricuspid. Aortic valve regurgitation is not visualized. Aortic valve mean gradient measures 2.0 mmHg. Aortic valve peak gradient measures 4.7 mmHg. Aortic valve area, by VTI measures 2.22 cm. Pulmonic Valve: The pulmonic valve was grossly normal. Pulmonic valve regurgitation is trivial. Aorta: The aortic root and ascending aorta are structurally normal, with no evidence of dilitation. Venous: The inferior vena cava was not well visualized. IAS/Shunts: No atrial level shunt detected by color flow Doppler. Additional Comments: A device lead is visualized.  LEFT VENTRICLE PLAX 2D                        Biplane EF (MOD) LVIDd:         4.60 cm         LV Biplane EF:   Left LVIDs:         3.50 cm                          ventricular LV PW:         1.00 cm                          ejection LV IVS:        1.30 cm  fraction by LVOT diam:     2.00 cm                          2D MOD LV SV:         50                               biplane is LV SV Index:   30                               30.1 %. LVOT Area:     3.14 cm                                Diastology                                 LV e' medial:    4.19 cm/s LV Volumes (MOD)               LV E/e' medial:  20.5 LV vol d, MOD    83.2 ml       LV e' lateral:   8.38 cm/s A2C:                           LV E/e' lateral: 10.2 LV vol d, MOD    95.9 ml A4C: LV vol s, MOD    51.6 ml A2C: LV vol s, MOD    68.8 ml A4C: LV SV MOD A2C:   31.6 ml LV SV MOD A4C:   95.9 ml LV SV MOD BP:    26.9 ml RIGHT VENTRICLE RV S prime:     4.40 cm/s LEFT ATRIUM              Index        RIGHT ATRIUM           Index LA diam:        4.80 cm  2.90 cm/m   RA Area:     28.00 cm LA Vol (A2C):   77.0 ml  46.57 ml/m  RA Volume:   90.40 ml  54.67 ml/m LA Vol (A4C):   108.0 ml 65.31 ml/m LA Biplane Vol: 96.2 ml  58.18 ml/m  AORTIC VALVE AV Area (Vmax):    2.39 cm AV Area (Vmean):   2.30 cm AV Area (VTI):     2.22 cm AV Vmax:           108.00 cm/s AV Vmean:          69.800 cm/s AV VTI:            0.225 m AV Peak Grad:      4.7 mmHg AV Mean Grad:      2.0 mmHg LVOT Vmax:         82.30 cm/s LVOT Vmean:        51.100 cm/s LVOT VTI:          0.159 m LVOT/AV VTI ratio: 0.71  AORTA Ao Root diam: 2.90 cm Ao Asc diam:  3.00 cm MV E velocity: 85.70 cm/s  TRICUSPID VALVE  TR Peak grad:   22.3 mmHg                            TR Vmax:        236.00 cm/s                             SHUNTS                            Systemic VTI:  0.16 m                            Systemic Diam: 2.00 cm Lyman Bishop MD Electronically signed by Lyman Bishop MD Signature Date/Time: 12/11/2021/3:14:02 PM    Final    DG Chest Port 1 View  Result Date: 12/09/2021 CLINICAL DATA:  Chest pain and shortness of breath EXAM: PORTABLE CHEST 1 VIEW COMPARISON:  None Available. FINDINGS: Moderate cardiomegaly. Remote median sternotomy and pacemaker placement. No focal airspace consolidation or pulmonary edema. Advanced osteoarthrosis at both shoulders. IMPRESSION: Moderate cardiomegaly. No acute cardiopulmonary process. Electronically Signed   By: Ulyses Jarred M.D.   On:  12/09/2021 19:39     Assessment and Plan:   Stephanie Atkinson is a 88 y.o. female with a hx of CAD status post CABG x4 '06, s/p DES to PDA '12, paroxysmal atrial fibrillation, syncope status post loop implant '14, symptomatic bradycardia s/p PPM '19, hypertension, hyperlipidemia, CKD stage III, hypothyroidism who is being seen 12/12/2021 for the evaluation of CHF at the request of Dr. Broadus John.  HFrEF BiV failure -- reports significant dyspnea on exertion, orthopnea and PND prior to admission despite doubling her lasix dose. BNP 1884 on admission.  -- Has been given IV lasix, net - 852 with significant improvement in her breathing -- Cr bumped from 1.5>>2.15 today therefore diuresis held -- echo this admission with decline in EF, 30-35% with severely RV function, global hypokinesis -- question whether this may be PPM related cardiomyopathy? -- stop amlodipine, start coreg 3.125mg  BID -- ARB/spiro held presently, would add once renal function improves  CAD s/p CABG x4 -- hsTn negative x2, denies any chest pain -- would continue medical management -- statin, add BB, ARB once renal function improves  Permanent Afib -- remains on DOAC, Eliquis (has been dose reduced with jump in Cr)  Bradycardia s/p PPM -- as above, 99.40% V pacing on last interrogation  -- will add coreg 3.125mg  BID  CKD III with AKI -- baseline Cr 1.5, bump to 2.15 today -- lasix held, along with ARB/spiro   Risk Assessment/Risk Scores:      New York Heart Association (NYHA) Functional Class NYHA Class II  CHA2DS2-VASc Score = 6  This indicates a 9.7% annual risk of stroke. The patient's score is based upon: CHF History: 1 HTN History: 1 Diabetes History: 0 Stroke History: 0 Vascular Disease History: 1 Age Score: 2 Gender Score: 1    For questions or updates, please contact [facility] Please consult [email or web address] for contact info under    Signed, Reino Bellis, NP  12/12/2021 2:59  PM  Patient seen and examined.  Agree with above documentation.  Stephanie Atkinson is a 88 year old female with history of CAD status post CABG 2006 and DES to PDA in 2012, syncope status post loop recorder in 2014, symptomatic  bradycardia status post pacemaker and 2019, paroxysmal atrial fibrillation, CKD stage III who consulted by Dr. Broadus John for evaluation of heart failure.  Prior echocardiogram 06/2019 showed EF 50 to 55%, normal RV function.  She presented to ED on 10/15 with shortness of breath.  Admission labs notable for BNP 184, sodium 133, creatinine 1.5.  EKG shows atrial fibrillation and V pacing.  She has been receiving IV Lasix, creatinine worsened today (1.55 > 2.15).  She reports feeling improved.  I/os incomplete.  Echocardiogram shows EF 30 to 35%, paradoxical septal motion consistent with RV pacing, severe RV dysfunction, severe biatrial enlargement, mild MR, mild to moderate TR.  On exam, patient is alert and oriented, regular rate and rhythm, no murmurs, lungs CTAB, no LE edema or JVD.  For new combined systolic and diastolic heart failure, recommend conservative management given her age.  Suspect heart failure could be secondary to RV pacing, she is 99% RV paced and significant dyssynchrony seen on echo.  Will start GDMT, if EF remains reduced on follow-up could consider upgrade to CRT (vs conservative management given her age).  She has been on losartan and spironolactone but held due to AKI.  Will add Coreg 3.125 mg twice daily.  She appears euvolemic on exam and with bump in creatinine with diuresis, would hold further diuresis for now.  Donato Heinz, MD

## 2021-12-12 NOTE — Progress Notes (Signed)
   12/12/21 1000  Mobility  Activity Ambulated with assistance in hallway  Level of Assistance Contact guard assist, steadying assist  Assistive Device Other (Comment) (HHA)  Distance Ambulated (ft) 150 ft  Activity Response Tolerated well  Mobility Referral Yes  $Mobility charge 1 Mobility   Mobility Specialist Progress Note  Post-Mobility: 99% SpO2  Received pt in be  having no complaints and agreeable to mobility. Pt was a little unstable throughout ambulation. Returned to chair w/ all needs met and call bell in reach.   Stephanie Atkinson Mobility Specialist

## 2021-12-13 ENCOUNTER — Other Ambulatory Visit (HOSPITAL_COMMUNITY): Payer: Self-pay

## 2021-12-13 DIAGNOSIS — I25810 Atherosclerosis of coronary artery bypass graft(s) without angina pectoris: Secondary | ICD-10-CM | POA: Diagnosis not present

## 2021-12-13 DIAGNOSIS — I5023 Acute on chronic systolic (congestive) heart failure: Secondary | ICD-10-CM | POA: Diagnosis not present

## 2021-12-13 DIAGNOSIS — I4821 Permanent atrial fibrillation: Secondary | ICD-10-CM | POA: Diagnosis not present

## 2021-12-13 DIAGNOSIS — I5043 Acute on chronic combined systolic (congestive) and diastolic (congestive) heart failure: Secondary | ICD-10-CM | POA: Diagnosis not present

## 2021-12-13 DIAGNOSIS — I872 Venous insufficiency (chronic) (peripheral): Secondary | ICD-10-CM

## 2021-12-13 DIAGNOSIS — N179 Acute kidney failure, unspecified: Secondary | ICD-10-CM | POA: Diagnosis not present

## 2021-12-13 DIAGNOSIS — N189 Chronic kidney disease, unspecified: Secondary | ICD-10-CM

## 2021-12-13 DIAGNOSIS — E785 Hyperlipidemia, unspecified: Secondary | ICD-10-CM

## 2021-12-13 DIAGNOSIS — I10 Essential (primary) hypertension: Secondary | ICD-10-CM

## 2021-12-13 LAB — BASIC METABOLIC PANEL
Anion gap: 5 (ref 5–15)
BUN: 34 mg/dL — ABNORMAL HIGH (ref 8–23)
CO2: 26 mmol/L (ref 22–32)
Calcium: 10.7 mg/dL — ABNORMAL HIGH (ref 8.9–10.3)
Chloride: 104 mmol/L (ref 98–111)
Creatinine, Ser: 1.7 mg/dL — ABNORMAL HIGH (ref 0.44–1.00)
GFR, Estimated: 27 mL/min — ABNORMAL LOW (ref >60)
Glucose, Bld: 97 mg/dL (ref 70–99)
Potassium: 4.7 mmol/L (ref 3.5–5.1)
Sodium: 135 mmol/L (ref 135–145)

## 2021-12-13 MED ORDER — VALSARTAN 40 MG PO TABS
40.0000 mg | ORAL_TABLET | Freq: Every day | ORAL | 0 refills | Status: DC
  Filled 2021-12-13: qty 30, 30d supply, fill #0

## 2021-12-13 MED ORDER — FUROSEMIDE 20 MG PO TABS: 20.0000 mg | ORAL_TABLET | Freq: Every day | ORAL | Status: DC

## 2021-12-13 MED ORDER — APIXABAN 2.5 MG PO TABS
2.5000 mg | ORAL_TABLET | Freq: Two times a day (BID) | ORAL | 0 refills | Status: DC
  Filled 2021-12-13: qty 60, 30d supply, fill #0

## 2021-12-13 MED ORDER — CARVEDILOL 3.125 MG PO TABS
3.1250 mg | ORAL_TABLET | Freq: Two times a day (BID) | ORAL | 0 refills | Status: DC
  Filled 2021-12-13: qty 60, 30d supply, fill #0

## 2021-12-13 MED ORDER — FUROSEMIDE 20 MG PO TABS: 20.0000 mg | ORAL_TABLET | Freq: Every day | ORAL | Status: DC | PRN

## 2021-12-13 MED ORDER — CARVEDILOL 3.125 MG PO TABS: 3.1250 mg | ORAL_TABLET | Freq: Two times a day (BID) | ORAL | 0 refills | Status: DC

## 2021-12-13 MED ORDER — VALSARTAN 40 MG PO TABS: 40.0000 mg | ORAL_TABLET | Freq: Every day | ORAL | 0 refills | Status: DC

## 2021-12-13 NOTE — Consult Note (Addendum)
ELECTROPHYSIOLOGY CONSULT NOTE    Patient ID: NAJAH MOALA MRN: [id number], DOB/AGE: 06-26-1924 88 y.o.  Admit date: 12/09/2021 Date of Consult: 12/13/2021  Primary Physician: Deon Pilling, NP Primary Cardiologist: Quay Burow, MD  Electrophysiologist: Dr. Sallyanne Kuster previously implanted device in 2019  Referring Provider: Dr. Sallyanne Kuster  Patient Profile: SHERRY ARROYO is a 88 y.o. female with a history of CAD s/p CABG 2006, s/p DES to PDA '12, PAF, syncope with loop implant 2014, symptomatic bradycardia and permanent AF with slow VR s/p pacing 2019, HTN, HLD, and CKD III who is being seen today for the evaluation of Heart Failure and 100% RV pacing at the request of Dr. Sallyanne Kuster.  HPI:  SULIANA OBREGON is a 88 y.o. female with medical history as above.   Seen in the office 04/2021 and overall doing well from a cardiac standpoint.  Her blood pressure was noted to be slightly elevated and she was resumed on valsartan.  She was seen in the hypertension clinic a month later and medications were adjusted to include a combination of valsartan, amlodipine and spironolactone.  She was advised to take her amlodipine in the evenings.   Last remote transmission 09/2021 with 99.49% V pacing but not pacemaker dependent at last in person check.    He was last seen in the office 11/05/2021 with Diona Browner, NP at which time she was accompanied by her daughter.  Noted she had had some issues with generalized musculoskeletal pain in the setting of lumbar spondylosis and was following with orthopedics.  She had received an injection of steroids with improvement.  She was continued on amlodipine, valsartan, spironolactone, Lasix, Lipitor as well as Eliquis.   Presented to the ED on 10/15 with complaints of increasing shortness of breath over the past week or so prior to admission. Had lots of sinus drainage. + orthopnea and PND with some LE edema. Reports she actually doubled her lasix several days before  presenting to the ED and her UOP had been decent. Also noticed increased dyspnea on exertion with simple daily activities.    Pertinent labs thus far include:  Potassium4.7 (10/19 0041) Magnesium  2.0 (10/16 1509) Creatinine, ser  1.70* (10/19 0041) PLT  206 (10/17 0040) HGB  13.6 (10/17 0040) WBC 5.6 (10/17 0040)  Echo this am showed LVEF 30-35%, severe RV dysfunction, severe LAE/RAE, mild MR, mild/mod TR with significant septal dyssynchrony.   Newly reduced from 06/2019 LVEF 50-55%  Dr. Sallyanne Kuster has asked EP to see to consider BIV upgrade.   She is currently feeling well at rest, and better overall with diuresis.   Past Medical History:  Diagnosis Date   Chronic kidney disease, stage 3 ([place])    Coronary artery disease    Hx of CABG 2006   LIMA-LAD, free RIMA-PDA, Radial-OM1-OM2   Hyperlipidemia    Hypertension    Hypothyroid    MI, old        PAF (paroxysmal atrial fibrillation) (HCC)    was on amio, d/c'd due to prolonged PR interval, rate control   Presence of permanent cardiac pacemaker 07/16/2017   Presence of stent in right coronary artery 07/12   RIMA-PDA occluded, DES stent placed   Syncope 07/12   bradycardic, beta blocker decreased, also felt to be dehydrated     Surgical History:  Past Surgical History:  Procedure Laterality Date   CARDIAC CATHETERIZATION  [id number]   dr. Shanon Brow harding, revealing a atretic bypass to her right with patent distal RCA  stent   CAROTID STENT  2012   CORONARY ARTERY BYPASS GRAFT  2006   LIMA-LAD, free RIMA-PDA, Radial-OM1-OM2   DOPPLER ECHOCARDIOGRAPHY  [id number]   mild asymmetric left ventricular hypertrophy, left ventricular systolic function is low normal, ejection fraction = 50-55%, the LA is moderate dilated, the RA is mildly dilated, no significant valvular disease   INSERT / REPLACE / REMOVE PACEMAKER  07/16/2017   JOINT REPLACEMENT     hip replacement   LEFT HEART CATHETERIZATION WITH CORONARY/GRAFT ANGIOGRAM N/A 10/12/2012    Procedure: LEFT HEART CATHETERIZATION WITH Beatrix Fetters;  Surgeon: Leonie Man, MD;  Location: [facility] CATH LAB;  Service: Cardiovascular;  Laterality: N/A;   LOOP RECORDER IMPLANT N/A 10/15/2012   Procedure: LOOP RECORDER IMPLANT;  Surgeon: Sanda Klein, MD;  Location: [place] [facility] CATH LAB;  Service: Cardiovascular;  Laterality: N/A;   LOOP RECORDER REMOVAL  07/16/2017   LOOP RECORDER REMOVAL N/A 07/16/2017   Procedure: LOOP RECORDER REMOVAL;  Surgeon: Sanda Klein, MD;  Location: [place] CV LAB;  Service: Cardiovascular;  Laterality: N/A;   NM MYOVIEW LTD  [id number]   post stress left ventricle is normal in size, post stress ejection fraction is 67% global left ventricular systolic function is normal, normal myocardial perfusion study, abnormal myocardial perfusion study, low risk scan   PACEMAKER IMPLANT N/A 07/16/2017   Procedure: PACEMAKER IMPLANT;  Surgeon: Sanda Klein, MD;  Location: [place] CV LAB;  Service: Cardiovascular;  Laterality: N/A;   THYROIDECTOMY       Medications Prior to Admission  Medication Sig Dispense Refill Last Dose   acetaminophen (TYLENOL) 500 MG tablet Take 1 tablet (500 mg total) by mouth every 6 (six) hours as needed for moderate pain. (Patient taking differently: Take 1,000 mg by mouth every 6 (six) hours as needed for moderate pain.) 30 tablet 0 12/09/2021   amLODipine (NORVASC) 10 MG tablet TAKE 1 TABLET BY MOUTH EVERY DAY (Patient taking differently: Take 10 mg by mouth daily.) 90 tablet 3 12/10/2021   apixaban (ELIQUIS) 5 MG TABS tablet TAKE 1 TABLET BY MOUTH TWICE A DAY (Patient taking differently: Take 5 mg by mouth 2 (two) times daily.) 180 tablet 1 12/10/2021 at 1001   atorvastatin (LIPITOR) 80 MG tablet Take 80 mg by mouth at bedtime.    Past Week   cyclobenzaprine (FLEXERIL) 10 MG tablet Take 5-10 mg by mouth daily as needed for muscle spasms.   Past Month   diphenhydrAMINE (BENADRYL) 25 MG tablet Take 25 mg by mouth every 6 (six) hours as  needed for allergies.   Past Week   furosemide (LASIX) 20 MG tablet TAKE 1 TABLET BY MOUTH EVERY DAY (Patient taking differently: Take 20 mg by mouth daily.) 90 tablet 3 12/10/2021   irbesartan (AVAPRO) 75 MG tablet Take 75 mg by mouth daily.   12/10/2021   levothyroxine (SYNTHROID, LEVOTHROID) 50 MCG tablet Take 50 mcg by mouth daily before breakfast.    12/10/2021   lidocaine (LIDODERM) 5 % Place 1 patch onto the skin daily. Remove & Discard patch within 12 hours or as directed by MD (Patient taking differently: Place 1 patch onto the skin daily as needed (pain).) 30 patch 0 Past Month   nitroGLYCERIN (NITROSTAT) 0.4 MG SL tablet Place 1 tablet (0.4 mg total) under the tongue every 5 (five) minutes as needed for chest pain. 25 tablet 6 UNK   Polyethyl Glycol-Propyl Glycol (SYSTANE OP) Place 1 drop into both eyes daily as needed (dry eye).   12/09/2021  spironolactone (ALDACTONE) 25 MG tablet TAKE 1 TABLET (25 MG TOTAL) BY MOUTH DAILY. (Patient taking differently: Take 25 mg by mouth daily.) 90 tablet 3 12/10/2021   traMADol (ULTRAM) 50 MG tablet Take 50 mg by mouth 2 (two) times daily.   12/10/2021   valsartan (DIOVAN) 80 MG tablet Take 1 tablet (80 mg total) by mouth daily. 60 tablet 0 12/09/2021    Inpatient Medications:   apixaban  2.5 mg Oral BID   atorvastatin  80 mg Oral QHS   carvedilol  3.125 mg Oral BID WC   levothyroxine  50 mcg Oral QAC breakfast   sodium chloride flush  3 mL Intravenous Q12H    Allergies:  Allergies  Allergen Reactions   Penicillins Hives    Did it involve swelling of the face/tongue/throat, SOB, or low BP? No Did it involve sudden or severe rash/hives, skin peeling, or any reaction on the inside of your mouth or nose? No Did you need to seek medical attention at a hospital or doctor's office? No When did it last happen? Unk    If all above answers are "NO", may proceed with cephalosporin use.    Sulfa Antibiotics Hives and Swelling    Social History    Socioeconomic History   Marital status: Single    Spouse name: Not on file   Number of children: Not on file   Years of education: Not on file   Highest education level: Not on file  Occupational History   Not on file  Tobacco Use   Smoking status: Never   Smokeless tobacco: Current    Types: Snuff  Vaping Use   Vaping Use: Never used  Substance and Sexual Activity   Alcohol use: No   Drug use: No   Sexual activity: Not on file  Other Topics Concern   Not on file  Social History Narrative   Not on file   Social Determinants of Health   Financial Resource Strain: Not on file  Food Insecurity: No Food Insecurity (12/12/2021)   Hunger Vital Sign    Worried About Running Out of Food in the Last Year: Never true    Ran Out of Food in the Last Year: Never true  Transportation Needs: No Transportation Needs (12/12/2021)   PRAPARE - Hydrologist (Medical): No    Lack of Transportation (Non-Medical): No  Physical Activity: Not on file  Stress: Not on file  Social Connections: Not on file  Intimate Partner Violence: Not At Risk (12/12/2021)   Humiliation, Afraid, Rape, and Kick questionnaire    Fear of Current or Ex-Partner: No    Emotionally Abused: No    Physically Abused: No    Sexually Abused: No     Family History  Problem Relation Age of Onset   CAD Mother    CAD Maternal Grandmother      Review of Systems: All other systems reviewed and are otherwise negative except as noted above.  Physical Exam: Vitals:   12/12/21 1959 12/13/21 0445 12/13/21 0715 12/13/21 0833  BP: (!) 117/55 132/71 (!) 122/55 133/73  Pulse:  64 64 63  Resp: 20 18 18 16   Temp: 97.6 F (36.4 C) 98.3 F (36.8 C) 98.2 F (36.8 C)   TempSrc: Oral Oral Oral   SpO2: 99% 98% 98% 99%  Weight:  61.4 kg    Height:        GEN- The patient is well appearing, alert and oriented x 3  today.   HEENT: normocephalic, atraumatic; sclera clear, conjunctiva pink;  hearing intact; oropharynx clear; neck supple Lungs- Clear to ausculation bilaterally, normal work of breathing.  No wheezes, rales, rhonchi Heart- Regular rate and rhythm, no murmurs, rubs or gallops GI- soft, non-tender, non-distended, bowel sounds present Extremities- no clubbing, cyanosis, or edema; DP/PT/radial pulses 2+ bilaterally MS- no significant deformity or atrophy Skin- warm and dry, no rash or lesion Psych- euthymic mood, full affect Neuro- strength and sensation are intact  Labs:   Lab Results  Component Value Date   WBC 5.6 12/11/2021   HGB 13.6 12/11/2021   HCT 40.4 12/11/2021   MCV 86.1 12/11/2021   PLT 206 12/11/2021    Recent Labs  Lab 12/13/21 0041  NA 135  K 4.7  CL 104  CO2 26  BUN 34*  CREATININE 1.70*  CALCIUM 10.7*  GLUCOSE 97      Radiology/Studies: ECHOCARDIOGRAM COMPLETE  Result Date: 12/11/2021    ECHOCARDIOGRAM REPORT   Patient Name:   AVAIYA SAWINSKI Date of Exam: 12/11/2021 Medical Rec #:  [id number]     Height:       63.0 in Accession #:    [id number]    Weight:       138.4 lb Date of Birth:  1924-11-14     BSA:          1.654 m Patient Age:    89 years      BP:           101/54 mmHg Patient Gender: F             HR:           61 bpm. Exam Location:  Inpatient Procedure: 2D Echo, Cardiac Doppler and Color Doppler Indications:    CHF-Acute Diastolic [id number]  History:        Patient has prior history of Echocardiogram examinations, most                 recent 06/26/2019. CAD, Pacemaker, Arrythmias:Atrial Fibrillation                 and Bradycardia, Signs/Symptoms:Syncope; Risk                 Factors:Hypertension and Dyslipidemia.  Sonographer:    Ronny Flurry Referring Phys: [id number] ALLISON WOLFE  Sonographer Comments: Technically difficult study due to poor echo windows. IMPRESSIONS  1. Significant septal pacer dysynchrony. Left ventricular ejection fraction, by estimation, is 30 to 35%. Left ventricular ejection fraction by 2D MOD biplane is 30.1  %. The left ventricle has moderately decreased function. The left ventricle demonstrates global hypokinesis. Left ventricular diastolic function could not be evaluated.  2. Right ventricular systolic function is severely reduced. The right ventricular size is normal.  3. Left atrial size was severely dilated.  4. Right atrial size was severely dilated.  5. The mitral valve is grossly normal. Mild mitral valve regurgitation.  6. The tricuspid valve is abnormal. Tricuspid valve regurgitation is mild to moderate.  7. The aortic valve is tricuspid. Aortic valve regurgitation is not visualized. Comparison(s): Changes from prior study are noted. 06/26/2019: LVEF 50-55%. FINDINGS  Left Ventricle: Significant septal pacer dysynchrony. Left ventricular ejection fraction, by estimation, is 30 to 35%. Left ventricular ejection fraction by 2D MOD biplane is 30.1 %. The left ventricle has moderately decreased function. The left ventricle demonstrates global hypokinesis. The left ventricular internal cavity size was normal in size. There is no left ventricular hypertrophy. Abnormal (  paradoxical) septal motion, consistent with RV pacemaker. Left ventricular diastolic function could not be evaluated due to atrial fibrillation. Left ventricular diastolic function could not be evaluated. Right Ventricle: The right ventricular size is normal. No increase in right ventricular wall thickness. Right ventricular systolic function is severely reduced. Left Atrium: Left atrial size was severely dilated. Right Atrium: Right atrial size was severely dilated. Pericardium: There is no evidence of pericardial effusion. Mitral Valve: The mitral valve is grossly normal. Mild mitral valve regurgitation. Tricuspid Valve: The tricuspid valve is abnormal. Tricuspid valve regurgitation is mild to moderate. Aortic Valve: The aortic valve is tricuspid. Aortic valve regurgitation is not visualized. Aortic valve mean gradient measures 2.0 mmHg. Aortic valve  peak gradient measures 4.7 mmHg. Aortic valve area, by VTI measures 2.22 cm. Pulmonic Valve: The pulmonic valve was grossly normal. Pulmonic valve regurgitation is trivial. Aorta: The aortic root and ascending aorta are structurally normal, with no evidence of dilitation. Venous: The inferior vena cava was not well visualized. IAS/Shunts: No atrial level shunt detected by color flow Doppler. Additional Comments: A device lead is visualized.  LEFT VENTRICLE PLAX 2D                        Biplane EF (MOD) LVIDd:         4.60 cm         LV Biplane EF:   Left LVIDs:         3.50 cm                          ventricular LV PW:         1.00 cm                          ejection LV IVS:        1.30 cm                          fraction by LVOT diam:     2.00 cm                          2D MOD LV SV:         50                               biplane is LV SV Index:   30                               30.1 %. LVOT Area:     3.14 cm                                Diastology                                LV e' medial:    4.19 cm/s LV Volumes (MOD)               LV E/e' medial:  20.5 LV vol d, MOD    83.2 ml       LV e' lateral:   8.38 cm/s A2C:  LV E/e' lateral: 10.2 LV vol d, MOD    95.9 ml A4C: LV vol s, MOD    51.6 ml A2C: LV vol s, MOD    68.8 ml A4C: LV SV MOD A2C:   31.6 ml LV SV MOD A4C:   95.9 ml LV SV MOD BP:    26.9 ml RIGHT VENTRICLE RV S prime:     4.40 cm/s LEFT ATRIUM              Index        RIGHT ATRIUM           Index LA diam:        4.80 cm  2.90 cm/m   RA Area:     28.00 cm LA Vol (A2C):   77.0 ml  46.57 ml/m  RA Volume:   90.40 ml  54.67 ml/m LA Vol (A4C):   108.0 ml 65.31 ml/m LA Biplane Vol: 96.2 ml  58.18 ml/m  AORTIC VALVE AV Area (Vmax):    2.39 cm AV Area (Vmean):   2.30 cm AV Area (VTI):     2.22 cm AV Vmax:           108.00 cm/s AV Vmean:          69.800 cm/s AV VTI:            0.225 m AV Peak Grad:      4.7 mmHg AV Mean Grad:      2.0 mmHg LVOT Vmax:         82.30  cm/s LVOT Vmean:        51.100 cm/s LVOT VTI:          0.159 m LVOT/AV VTI ratio: 0.71  AORTA Ao Root diam: 2.90 cm Ao Asc diam:  3.00 cm MV E velocity: 85.70 cm/s  TRICUSPID VALVE                            TR Peak grad:   22.3 mmHg                            TR Vmax:        236.00 cm/s                             SHUNTS                            Systemic VTI:  0.16 m                            Systemic Diam: 2.00 cm Lyman Bishop MD Electronically signed by Lyman Bishop MD Signature Date/Time: 12/11/2021/3:14:02 PM    Final    DG Chest Port 1 View  Result Date: 12/09/2021 CLINICAL DATA:  Chest pain and shortness of breath EXAM: PORTABLE CHEST 1 VIEW COMPARISON:  None Available. FINDINGS: Moderate cardiomegaly. Remote median sternotomy and pacemaker placement. No focal airspace consolidation or pulmonary edema. Advanced osteoarthrosis at both shoulders. IMPRESSION: Moderate cardiomegaly. No acute cardiopulmonary process. Electronically Signed   By: Ulyses Jarred M.D.   On: 12/09/2021 19:39    EKG: EKG 10/15 showed V paced rhythm at 67 bpm with QRS of 140 ms (personally reviewed)  TELEMETRY: V paced 60s (personally reviewed)  DEVICE  HISTORY:  Medtronic Single Chamber [facility] 06/2017 Loop recorder 2014, explanted at time of PPM  Assessment/Plan: 1. Pacing induced left bundle and cardiomyopathy 2. HFrEF with biventricular failure EF today 30-35%, down from 50-55% 06/2019 As below, Dr. Sallyanne Kuster planning possible outpatient perfusion study/myoview Would recommend GDMT and diuresis as tolerated and follow back up as outpatient.  She would need Venogram, and may not have good CS options for an LV lead. She understands an upgrade would not be a sure thing and given biventricular failure and advanced age would be a poor candidate for extraction.    3. Permanent AF with slow VR 4. Symptomatic bradycardia s/p MDT single chamber PPM 2019 On eliquis 2.5 mg BID  5. CAD s/p CABG HS trop negative x 2, no  chest pain Continue statin  6. CKD III Creatinine, ser  1.70* (10/19 0041) Baseline 1.1 - 1.3  For questions or updates, please contact [facility] Please consult [email or web address] for contact info under Cardiology/STEMI.  Signed, Shirley Friar, PA-C  12/13/2021 10:28 AM   I have seen and examined this patient with Oda Kilts.  Agree with above, note added to reflect my findings.  Patient with a history stated for coronary artery disease post CABG, permanent atrial fibrillation with slow ventricular response status post pacemaker, CKD stage III.  She presented to the hospital with dyspnea and orthopnea.  She was found to be in systolic heart failure with a new ejection fraction of 30 to 35%.  She has undergone diuresis, potentially down 14 pounds.  She feels better, back to her normal state.  GEN: Well nourished, well developed, in no acute distress  HEENT: normal  Neck: no JVD, carotid bruits, or masses Cardiac: RRR; no murmurs, rubs, or gallops,no edema  Respiratory:  clear to auscultation bilaterally, normal work of breathing GI: soft, nontender, nondistended, + BS MS: no deformity or atrophy  Skin: warm and dry, device site well healed Neuro:  Strength and sensation are intact Psych: euthymic mood, full affect   Acute systolic heart failure: Potentially due to a pacemaker induced cardiomyopathy.  Ejection fraction 30 to 35%.  She is euvolemic currently.  She would potentially benefit from LV lead placement, though due to her advanced age, would prefer to treat with medical management initially.  Agree with continuing her beta-blocker, starting Entresto and continuing Aldactone.  If her symptoms improve on optimal heart failure medication, would hold off on device upgrade.  If she does continue to have issues with heart failure, device upgrade would be reasonable. Permanent atrial fibrillation: Continue Eliquis Coronary artery disease: Post CABG.  No chest pain.  Jahzeel Poythress M.  Jezreel Justiniano MD 12/13/2021 12:55 PM

## 2021-12-13 NOTE — Assessment & Plan Note (Addendum)
Echocardiogram with reduced LV systolic function EF 30 to 35%, with global hypokinesis, RV systolic function with severe reduction, severe dilatation of LA and RA, mild to moderate TR.   Potentially pacemaker induced cardiomyopathy.   Patient was placed on furosemide, negative fluid balanced was achieved with improvement in her symptoms.   Continue medical therapy with valsartan, carvedilol and furosemide (increase dose in case of volume overload).  Hold on spironolactone for now until renal function more stable.   Patient will follow up with cardiology as outpatient for possible outpatient nuclear perfusion study. If no improvement in LV systolic function with medical therapy, she may be candidate for biventricular pacing with CRT-P.

## 2021-12-13 NOTE — Progress Notes (Signed)
   12/13/21 1505  Mobility  Activity Ambulated with assistance in hallway  Level of Assistance Standby assist, set-up cues, supervision of patient - no hands on  Assistive Device Front wheel walker  Distance Ambulated (ft) 400 ft  Activity Response Tolerated well  Mobility Referral Yes  $Mobility charge 1 Mobility    Mobility Specialist Progress Note  Received pt in chair having no complaints and agreeable to mobility. Pt was asymptomatic throughout ambulation and returned to room w/o fault. Left in chair w/ call bell in reach and all needs met.   Stephanie Atkinson Mobility Specialist

## 2021-12-13 NOTE — Plan of Care (Signed)
°  Problem: Education: °Goal: Knowledge of General Education information will improve °Description: Including pain rating scale, medication(s)/side effects and non-pharmacologic comfort measures °Outcome: Progressing °  °Problem: Health Behavior/Discharge Planning: °Goal: Ability to manage health-related needs will improve °Outcome: Progressing °  °Problem: Clinical Measurements: °Goal: Ability to maintain clinical measurements within normal limits will improve °Outcome: Progressing °Goal: Will remain free from infection °Outcome: Progressing °Goal: Diagnostic test results will improve °Outcome: Progressing °Goal: Respiratory complications will improve °Outcome: Progressing °Goal: Cardiovascular complication will be avoided °Outcome: Progressing °  °Problem: Activity: °Goal: Risk for activity intolerance will decrease °Outcome: Progressing °  °Problem: Coping: °Goal: Level of anxiety will decrease °Outcome: Progressing °  °Problem: Elimination: °Goal: Will not experience complications related to bowel motility °Outcome: Progressing °Goal: Will not experience complications related to urinary retention °Outcome: Progressing °  °Problem: Pain Managment: °Goal: General experience of comfort will improve °Outcome: Progressing °  °Problem: Safety: °Goal: Ability to remain free from injury will improve °Outcome: Progressing °  °Problem: Skin Integrity: °Goal: Risk for impaired skin integrity will decrease °Outcome: Progressing °  °Problem: Education: °Goal: Ability to demonstrate management of disease process will improve °Outcome: Progressing °Goal: Ability to verbalize understanding of medication therapies will improve °Outcome: Progressing °Goal: Individualized Educational Video(s) °Outcome: Progressing °  °Problem: Activity: °Goal: Capacity to carry out activities will improve °Outcome: Progressing °  °Problem: Cardiac: °Goal: Ability to achieve and maintain adequate cardiopulmonary perfusion will improve °Outcome:  Progressing °  °

## 2021-12-13 NOTE — Progress Notes (Signed)
Rounding Note    Patient Name: Stephanie Atkinson Date of Encounter: 12/13/2021  [place] Cardiologist: Quay Burow, MD   Subjective   Feels well. Walked hallway without desaturation. Creatinine improved with diuresis, but not back to baseline. Not sure initial weight was accurate, but if so has lost 14 lb of fluid.  Inpatient Medications    Scheduled Meds:  apixaban  2.5 mg Oral BID   atorvastatin  80 mg Oral QHS   carvedilol  3.125 mg Oral BID WC   levothyroxine  50 mcg Oral QAC breakfast   sodium chloride flush  3 mL Intravenous Q12H   Continuous Infusions:  sodium chloride     PRN Meds: sodium chloride, acetaminophen **OR** acetaminophen, polyvinyl alcohol, sodium chloride flush, traMADol   Vital Signs    Vitals:   12/12/21 1959 12/13/21 0445 12/13/21 0715 12/13/21 0833  BP: (!) 117/55 132/71 (!) 122/55 133/73  Pulse:  64 64 63  Resp: 20 18 18 16   Temp: 97.6 F (36.4 C) 98.3 F (36.8 C) 98.2 F (36.8 C)   TempSrc: Oral Oral Oral   SpO2: 99% 98% 98% 99%  Weight:  61.4 kg    Height:        Intake/Output Summary (Last 24 hours) at 12/13/2021 1036 Last data filed at 12/13/2021 0811 Gross per 24 hour  Intake 1498 ml  Output 1250 ml  Net 248 ml      12/13/2021    4:45 AM 12/12/2021    4:13 AM 12/11/2021    4:10 AM  Last 3 Weights  Weight (lbs) 135 lb 4.8 oz 136 lb 8 oz 138 lb 6.4 oz  Weight (kg) 61.372 kg 61.916 kg 62.778 kg      Telemetry    100% V paced (background AFib) - Personally Reviewed  ECG    V paced, QRS 160 ms - Personally Reviewed  Physical Exam  Appears much younger than stated age GEN: No acute distress.   Neck: No JVD Cardiac: RRR, paradoxically split S2, no murmurs, rubs, or gallops.  Respiratory: Clear to auscultation bilaterally. GI: Soft, nontender, non-distended  MS: No edema; No deformity. Neuro:  Nonfocal  Psych: Normal affect   Labs    High Sensitivity Troponin:   Recent Labs  Lab  12/09/21 1901 12/09/21 2102  TROPONINIHS 12 13     Chemistry Recent Labs  Lab 12/10/21 1509 12/11/21 0040 12/12/21 0051 12/13/21 0041  NA  --  135 132* 135  K  --  4.3 4.1 4.7  CL  --  106 101 104  CO2  --  22 24 26   GLUCOSE  --  94 97 97  BUN  --  28* 37* 34*  CREATININE  --  1.55* 2.15* 1.70*  CALCIUM  --  10.5* 10.4* 10.7*  MG 2.0  --   --   --   GFRNONAA  --  31* 21* 27*  ANIONGAP  --  7 7 5     Lipids No results for input(s): "CHOL", "TRIG", "HDL", "LABVLDL", "LDLCALC", "CHOLHDL" in the last 168 hours.  Hematology Recent Labs  Lab 12/09/21 1901 12/11/21 0040  WBC 5.4 5.6  RBC 4.33 4.69  HGB 12.3 13.6  HCT 37.8 40.4  MCV 87.3 86.1  MCH 28.4 29.0  MCHC 32.5 33.7  RDW 13.2 13.1  PLT 198 206   Thyroid  Recent Labs  Lab 12/10/21 1509  TSH 1.602    BNP Recent Labs  Lab 12/09/21 1901  BNP  184.4*    DDimer No results for input(s): "DDIMER" in the last 168 hours.   Radiology    ECHOCARDIOGRAM COMPLETE  Result Date: 12/11/2021    ECHOCARDIOGRAM REPORT   Patient Name:   Stephanie Atkinson Date of Exam: 12/11/2021 Medical Rec #:  [id number]     Height:       63.0 in Accession #:    [id number]    Weight:       138.4 lb Date of Birth:  [birth year]     BSA:          1.654 m Patient Age:    88 years      BP:           101/54 mmHg Patient Gender: F             HR:           61 bpm. Exam Location:  Inpatient Procedure: 2D Echo, Cardiac Doppler and Color Doppler Indications:    CHF-Acute Diastolic Q00.86  History:        Patient has prior history of Echocardiogram examinations, most                 recent 06/26/2019. CAD, Pacemaker, Arrythmias:Atrial Fibrillation                 and Bradycardia, Signs/Symptoms:Syncope; Risk                 Factors:Hypertension and Dyslipidemia.  Sonographer:    Ronny Flurry Referring Phys: [id number] ALLISON WOLFE  Sonographer Comments: Technically difficult study due to poor echo windows. IMPRESSIONS  1. Significant septal pacer dysynchrony. Left  ventricular ejection fraction, by estimation, is 30 to 35%. Left ventricular ejection fraction by 2D MOD biplane is 30.1 %. The left ventricle has moderately decreased function. The left ventricle demonstrates global hypokinesis. Left ventricular diastolic function could not be evaluated.  2. Right ventricular systolic function is severely reduced. The right ventricular size is normal.  3. Left atrial size was severely dilated.  4. Right atrial size was severely dilated.  5. The mitral valve is grossly normal. Mild mitral valve regurgitation.  6. The tricuspid valve is abnormal. Tricuspid valve regurgitation is mild to moderate.  7. The aortic valve is tricuspid. Aortic valve regurgitation is not visualized. Comparison(s): Changes from prior study are noted. 06/26/2019: LVEF 50-55%. FINDINGS  Left Ventricle: Significant septal pacer dysynchrony. Left ventricular ejection fraction, by estimation, is 30 to 35%. Left ventricular ejection fraction by 2D MOD biplane is 30.1 %. The left ventricle has moderately decreased function. The left ventricle demonstrates global hypokinesis. The left ventricular internal cavity size was normal in size. There is no left ventricular hypertrophy. Abnormal (paradoxical) septal motion, consistent with RV pacemaker. Left ventricular diastolic function could not be evaluated due to atrial fibrillation. Left ventricular diastolic function could not be evaluated. Right Ventricle: The right ventricular size is normal. No increase in right ventricular wall thickness. Right ventricular systolic function is severely reduced. Left Atrium: Left atrial size was severely dilated. Right Atrium: Right atrial size was severely dilated. Pericardium: There is no evidence of pericardial effusion. Mitral Valve: The mitral valve is grossly normal. Mild mitral valve regurgitation. Tricuspid Valve: The tricuspid valve is abnormal. Tricuspid valve regurgitation is mild to moderate. Aortic Valve: The aortic  valve is tricuspid. Aortic valve regurgitation is not visualized. Aortic valve mean gradient measures 2.0 mmHg. Aortic valve peak gradient measures 4.7 mmHg. Aortic valve area, by VTI measures 2.22 cm. Pulmonic  Valve: The pulmonic valve was grossly normal. Pulmonic valve regurgitation is trivial. Aorta: The aortic root and ascending aorta are structurally normal, with no evidence of dilitation. Venous: The inferior vena cava was not well visualized. IAS/Shunts: No atrial level shunt detected by color flow Doppler. Additional Comments: A device lead is visualized.  LEFT VENTRICLE PLAX 2D                        Biplane EF (MOD) LVIDd:         4.60 cm         LV Biplane EF:   Left LVIDs:         3.50 cm                          ventricular LV PW:         1.00 cm                          ejection LV IVS:        1.30 cm                          fraction by LVOT diam:     2.00 cm                          2D MOD LV SV:         50                               biplane is LV SV Index:   30                               30.1 %. LVOT Area:     3.14 cm                                Diastology                                LV e' medial:    4.19 cm/s LV Volumes (MOD)               LV E/e' medial:  20.5 LV vol d, MOD    83.2 ml       LV e' lateral:   8.38 cm/s A2C:                           LV E/e' lateral: 10.2 LV vol d, MOD    95.9 ml A4C: LV vol s, MOD    51.6 ml A2C: LV vol s, MOD    68.8 ml A4C: LV SV MOD A2C:   31.6 ml LV SV MOD A4C:   95.9 ml LV SV MOD BP:    26.9 ml RIGHT VENTRICLE RV S prime:     4.40 cm/s LEFT ATRIUM              Index        RIGHT ATRIUM           Index LA diam:  4.80 cm  2.90 cm/m   RA Area:     28.00 cm LA Vol (A2C):   77.0 ml  46.57 ml/m  RA Volume:   90.40 ml  54.67 ml/m LA Vol (A4C):   108.0 ml 65.31 ml/m LA Biplane Vol: 96.2 ml  58.18 ml/m  AORTIC VALVE AV Area (Vmax):    2.39 cm AV Area (Vmean):   2.30 cm AV Area (VTI):     2.22 cm AV Vmax:           108.00 cm/s AV Vmean:           69.800 cm/s AV VTI:            0.225 m AV Peak Grad:      4.7 mmHg AV Mean Grad:      2.0 mmHg LVOT Vmax:         82.30 cm/s LVOT Vmean:        51.100 cm/s LVOT VTI:          0.159 m LVOT/AV VTI ratio: 0.71  AORTA Ao Root diam: 2.90 cm Ao Asc diam:  3.00 cm MV E velocity: 85.70 cm/s  TRICUSPID VALVE                            TR Peak grad:   22.3 mmHg                            TR Vmax:        236.00 cm/s                             SHUNTS                            Systemic VTI:  0.16 m                            Systemic Diam: 2.00 cm Lyman Bishop MD Electronically signed by Lyman Bishop MD Signature Date/Time: 12/11/2021/3:14:02 PM    Final     Cardiac Studies   ECHO 12/11/2021   1. Significant septal pacer dysynchrony. Left ventricular ejection fraction, by estimation, is 30 to 35%. Left ventricular ejection fraction by 2D MOD biplane is 30.1 %. The left ventricle has moderately decreased function. The left ventricle demonstrates global hypokinesis. Left ventricular diastolic function could not be evaluated.   2. Right ventricular systolic function is severely reduced. The right ventricular size is normal.   3. Left atrial size was severely dilated.   4. Right atrial size was severely dilated.   5. The mitral valve is grossly normal. Mild mitral valve regurgitation.   6. The tricuspid valve is abnormal. Tricuspid valve regurgitation is mild to moderate.   7. The aortic valve is tricuspid. Aortic valve regurgitation is not visualized. Comparison(s): Changes from prior study are noted. 06/26/2019: LVEF 50-55%.  Patient Profile     88 y.o. female with CAD (CABG 2006, DES-PDA 2012), permanent atrial fibrillation with slow ventricular response s/p pacemaker ([employer] Azure single chamber 2019) with 100% V pacing, HTN, HLP, CKD 3 now with first episode of HF requiring hospitalization, found to have marked decline in LV systolic function.   Assessment & Plan    No angina. Dyspnea  resolved. Appears euvolemic. Although  she had extensive CAD history, the echo pattern of LV dysfunction is global with very pronounced dyssynchrony due to RV pacing. QRS is >160 ms on my measurement. Carvedilol started this admission (was 100% paced even before beta blocker). Was already on ARB/spironolactone and loop diuretic before the current HF decompensation episode).  - discussed low sodium diet, daily weight monitoring, signs/sxs of HF exacerbation - recommend EP evaluation for upgrade to CRT-P - consider ischemia workup, but she has had angina before when she had significant disease, none now. Outpatient nuclear perfusion study is not unreasonable before committing to CRT-P. Coronary angio would not be risk- free due to renal dysfunction.  - continue Eliquis, carvedilol, valsartan, spiro, furosemide, atorvastatin at DC. Replace amlodipine with higher doses of ARB and beta blocker.    For questions or updates, please contact [place] Please consult [email or web address] for contact info under        Signed, Sanda Klein, MD  12/13/2021, 10:36 AM

## 2021-12-13 NOTE — TOC Transition Note (Addendum)
Transition of Care [facility]) - CM/SW Discharge Note   Patient Details  Name: Stephanie Atkinson MRN: [id number] Date of Birth: 08-05-1924  [facility] CM/SW Contact:  Zenon Mayo, RN Phone Number: 12/13/2021, 2:26 PM   Clinical Narrative:    Patient is for discharge today, her family is at the bedside and will transport her home, she is indep, she has no needs. TOC to fill meds.         Patient Goals and CMS Choice        Discharge Placement                       Discharge Plan and Services                                     Social Determinants of Health (SDOH) Interventions Housing Interventions: Intervention Not Indicated   Readmission Risk Interventions     No data to display

## 2021-12-13 NOTE — Assessment & Plan Note (Addendum)
CKD stage 3b, hyponatremia.  Patient tolerated well diuresis, her peak cr reached 2,15 at the time of her discharge is in 1,70. Her serum cr in 04/23 was 1,23.   Plan to continue valsartan at a lower dose, and hold spironolactone until renal function more stable. Loop diuretic with furosemide 20 mg daily with instructions to increase to 40 mg daily in case of volume overload.   Follow up renal function and electrolytes as outpatient in 7 days.

## 2021-12-13 NOTE — Discharge Summary (Addendum)
Physician Discharge Summary   Patient: Stephanie Atkinson MRN: [id number] DOB: 1925/01/21  Admit date:     12/09/2021  Discharge date: 12/13/21  Discharge Physician: Jimmy Picket Justino Boze   PCP: Deon Pilling, NP   Recommendations at discharge:    Patient will continue heart failure management with carvedilol and valsartan Loop diuretic with furosemide with instructions to double the dose in case of volume overload ( weight gain 2 to 3 lbs in 24 hrs or 5 lbs in 7 days).  Hold on spironolactone until follow up renal function as outpatient.  Follow up with Deon Pilling NP in 7 to 10 days Follow up with Cardiology as scheduled Follow up renal function and electrolytes in 7 days.  Possible outpatient nuclear perfusion study.  Follow up with EP as scheduled.   I spoke over the phone with the patient's daughter about patient's  condition, plan of care, prognosis and all questions were addressed.   Discharge Diagnoses: Principal Problem:   Acute on chronic systolic CHF (congestive heart failure) (HCC) Active Problems:   Permanent atrial fibrillation (HCC)   CAD (coronary artery disease) of artery bypass graft   Acute kidney injury superimposed on chronic kidney disease (HCC)   Hypertension   Dyslipidemia   Venous stasis dermatitis of both lower extremities  Resolved Problems:   * No resolved hospital problems. [facility] Course: Stephanie Atkinson was admitted to the hospital with the working diagnosis of decompensated heart failure.   88 yo female with the past medical history of coronary artery disease, sp CABG, CKD stage 3, hypertension, dyslipidemia, heart failure, bradycardia sp pacemaker and paroxysmal atrial fibrillation who presented with dyspnea. Reported 7 to 10 days of worsening dyspnea, worse with exertion, and decreased physical functional capacity. Her symptoms were persistent despite taking extra furosemide dose at home for 2 consecutive days. On her initial physical examination her  blood pressure was 143//64, HR 62, RR 16 and 02 saturation 94% on room air. Lungs with no wheezing, heart with S1 and S2 present and rhythmic with no gallops, abdomen with no distention and trace lower extremity edema.   Na 133, K 4,0 Cl 102 bicarbonate 23, glucose 86, bun 29 cr 1,51 BNP 184  High sensitive troponin 12 and 13  Wbc 5,4 hgb 12,3 plt 198  Sars covid 19 negative   Chest radiograph with mild cardiomegaly, no infiltrates or effusions, pacemaker in place with one ventricular lead. Sternotomy wires in place.   EKG 67 bpm, left axis deviation, left bundle branch block pattern qrs, ventricular paced rhythm 100%, with no significant ST segment or T wave changes.   Patient was placed on furosemide for diuresis.  Echocardiogram with reduced LV systolic function. EP consulted for possible upgrade pacer to biventricular (CRT-P).       Assessment and Plan: * Acute on chronic systolic CHF (congestive heart failure) (HCC) Echocardiogram with reduced LV systolic function EF 30 to 35%, with global hypokinesis, RV systolic function with severe reduction, severe dilatation of LA and RA, mild to moderate TR.   Potentially pacemaker induced cardiomyopathy.   Patient was placed on furosemide, negative fluid balanced was achieved with improvement in her symptoms.   Continue medical therapy with valsartan, carvedilol and furosemide (increase dose in case of volume overload).  Hold on spironolactone for now until renal function more stable.   Patient will follow up with cardiology as outpatient for possible outpatient nuclear perfusion study. If no improvement in LV systolic function with medical therapy, she may  be candidate for biventricular pacing with CRT-P.   Permanent atrial fibrillation (HCC) Ventricular pacing with rate control Continue with carvedilol and anticoagulation with apixaban (renal dose for low GFR).   CAD (coronary artery disease) of artery bypass graft -CAD s/p CABG  x4 in 2006 with subsequent DES-PDA in July 2012. Repeat cardiac catheterization in 2014 showed stable coronary anatomy -continue medical management. Not on ASA due to DOAC therapy   Acute kidney injury superimposed on chronic kidney disease (HCC) CKD stage 3b, hyponatremia.  Patient tolerated well diuresis, her peak cr reached 2,15 at the time of her discharge is in 1,70. Her serum cr in 04/23 was 1,23.   Plan to continue valsartan at a lower dose, and hold spironolactone until renal function more stable. Loop diuretic with furosemide 20 mg daily with instructions to increase to 40 mg daily in case of volume overload.   Follow up renal function and electrolytes as outpatient in 7 days.   Hypertension Blood pressure has improved. Plan to continue with carvedilol and valsartan   Dyslipidemia Continue with atorvastatin   Venous stasis dermatitis of both lower extremities Stable TED hose          Consultants: cardiology and EP  Procedures performed: none   Disposition: Home Diet recommendation:  Cardiac diet DISCHARGE MEDICATION: Allergies as of 12/13/2021       Reactions   Penicillins Hives   Did it involve swelling of the face/tongue/throat, SOB, or low BP? No Did it involve sudden or severe rash/hives, skin peeling, or any reaction on the inside of your mouth or nose? No Did you need to seek medical attention at a hospital or doctor's office? No When did it last happen? Unk    If all above answers are "NO", may proceed with cephalosporin use.   Sulfa Antibiotics Hives, Swelling        Medication List     STOP taking these medications    amLODipine 10 MG tablet Commonly known as: NORVASC   irbesartan 75 MG tablet Commonly known as: AVAPRO   spironolactone 25 MG tablet Commonly known as: ALDACTONE       TAKE these medications    acetaminophen 500 MG tablet Commonly known as: TYLENOL Take 1 tablet (500 mg total) by mouth every 6 (six) hours as needed  for moderate pain. What changed: how much to take   carvedilol 3.125 MG tablet Commonly known as: COREG Take 1 tablet (3.125 mg total) by mouth 2 (two) times daily with a meal.   cyclobenzaprine 10 MG tablet Commonly known as: FLEXERIL Take 5-10 mg by mouth daily as needed for muscle spasms.   diphenhydrAMINE 25 MG tablet Commonly known as: BENADRYL Take 25 mg by mouth every 6 (six) hours as needed for allergies.   Eliquis 2.5 MG Tabs tablet Generic drug: apixaban Take 1 tablet (2.5 mg total) by mouth 2 (two) times daily. What changed:  medication strength how much to take   furosemide 20 MG tablet Commonly known as: LASIX Take 1 tablet (20 mg total) by mouth daily. Take to tablets daily in case of weight gain 2 to 3 bs in 24 hrs or 5 lbs in 7 days, until weight returns to baseline. What changed: additional instructions   levothyroxine 50 MCG tablet Commonly known as: SYNTHROID Take 50 mcg by mouth daily before breakfast.   lidocaine 5 % Commonly known as: Lidoderm Place 1 patch onto the skin daily. Remove & Discard patch within 12 hours or as directed  by MD What changed:  when to take this reasons to take this additional instructions   Lipitor 80 MG tablet Generic drug: atorvastatin Take 80 mg by mouth at bedtime.   nitroGLYCERIN 0.4 MG SL tablet Commonly known as: NITROSTAT Place 1 tablet (0.4 mg total) under the tongue every 5 (five) minutes as needed for chest pain.   SYSTANE OP Place 1 drop into both eyes daily as needed (dry eye).   traMADol 50 MG tablet Commonly known as: ULTRAM Take 50 mg by mouth 2 (two) times daily.   valsartan 40 MG tablet Commonly known as: DIOVAN Take 1 tablet (40 mg total) by mouth daily. What changed:  medication strength how much to take        Discharge Exam: Filed Weights   12/11/21 0410 12/12/21 0413 12/13/21 0445  Weight: 62.8 kg 61.9 kg 61.4 kg   BP 133/73 (BP Location: Left Arm)   Pulse 63   Temp 98.2 F  (36.8 C) (Oral)   Resp 16   Ht 5\' 3"  (1.6 m)   Wt 61.4 kg   SpO2 99%   BMI 23.97 kg/m   No dyspnea or chest pain, no edema  Neurology awake and alert ENT with mild pallor Cardiovascular with S1 and S2 present and regular with no gallops or murmurs Respiratory with no rales or wheezing Abdomen with no distention  No lower extremity edema   Condition at discharge: stable  The results of significant diagnostics from this hospitalization (including imaging, microbiology, ancillary and laboratory) are listed below for reference.   Imaging Studies: ECHOCARDIOGRAM COMPLETE  Result Date: 12/11/2021    ECHOCARDIOGRAM REPORT   Patient Name:   Stephanie Atkinson Date of Exam: 12/11/2021 Medical Rec #:  [id number]     Height:       63.0 in Accession #:    [id number]    Weight:       138.4 lb Date of Birth:  06-18-1924     BSA:          1.654 m Patient Age:    89 years      BP:           101/54 mmHg Patient Gender: F             HR:           61 bpm. Exam Location:  Inpatient Procedure: 2D Echo, Cardiac Doppler and Color Doppler Indications:    CHF-Acute Diastolic R15.40  History:        Patient has prior history of Echocardiogram examinations, most                 recent 06/26/2019. CAD, Pacemaker, Arrythmias:Atrial Fibrillation                 and Bradycardia, Signs/Symptoms:Syncope; Risk                 Factors:Hypertension and Dyslipidemia.  Sonographer:    Ronny Flurry Referring Phys: [id number] ALLISON WOLFE  Sonographer Comments: Technically difficult study due to poor echo windows. IMPRESSIONS  1. Significant septal pacer dysynchrony. Left ventricular ejection fraction, by estimation, is 30 to 35%. Left ventricular ejection fraction by 2D MOD biplane is 30.1 %. The left ventricle has moderately decreased function. The left ventricle demonstrates global hypokinesis. Left ventricular diastolic function could not be evaluated.  2. Right ventricular systolic function is severely reduced. The right  ventricular size is normal.  3. Left atrial size was severely dilated.  4.  Right atrial size was severely dilated.  5. The mitral valve is grossly normal. Mild mitral valve regurgitation.  6. The tricuspid valve is abnormal. Tricuspid valve regurgitation is mild to moderate.  7. The aortic valve is tricuspid. Aortic valve regurgitation is not visualized. Comparison(s): Changes from prior study are noted. 06/26/2019: LVEF 50-55%. FINDINGS  Left Ventricle: Significant septal pacer dysynchrony. Left ventricular ejection fraction, by estimation, is 30 to 35%. Left ventricular ejection fraction by 2D MOD biplane is 30.1 %. The left ventricle has moderately decreased function. The left ventricle demonstrates global hypokinesis. The left ventricular internal cavity size was normal in size. There is no left ventricular hypertrophy. Abnormal (paradoxical) septal motion, consistent with RV pacemaker. Left ventricular diastolic function could not be evaluated due to atrial fibrillation. Left ventricular diastolic function could not be evaluated. Right Ventricle: The right ventricular size is normal. No increase in right ventricular wall thickness. Right ventricular systolic function is severely reduced. Left Atrium: Left atrial size was severely dilated. Right Atrium: Right atrial size was severely dilated. Pericardium: There is no evidence of pericardial effusion. Mitral Valve: The mitral valve is grossly normal. Mild mitral valve regurgitation. Tricuspid Valve: The tricuspid valve is abnormal. Tricuspid valve regurgitation is mild to moderate. Aortic Valve: The aortic valve is tricuspid. Aortic valve regurgitation is not visualized. Aortic valve mean gradient measures 2.0 mmHg. Aortic valve peak gradient measures 4.7 mmHg. Aortic valve area, by VTI measures 2.22 cm. Pulmonic Valve: The pulmonic valve was grossly normal. Pulmonic valve regurgitation is trivial. Aorta: The aortic root and ascending aorta are structurally  normal, with no evidence of dilitation. Venous: The inferior vena cava was not well visualized. IAS/Shunts: No atrial level shunt detected by color flow Doppler. Additional Comments: A device lead is visualized.  LEFT VENTRICLE PLAX 2D                        Biplane EF (MOD) LVIDd:         4.60 cm         LV Biplane EF:   Left LVIDs:         3.50 cm                          ventricular LV PW:         1.00 cm                          ejection LV IVS:        1.30 cm                          fraction by LVOT diam:     2.00 cm                          2D MOD LV SV:         50                               biplane is LV SV Index:   30                               30.1 %. LVOT Area:     3.14 cm  Diastology                                LV e' medial:    4.19 cm/s LV Volumes (MOD)               LV E/e' medial:  20.5 LV vol d, MOD    83.2 ml       LV e' lateral:   8.38 cm/s A2C:                           LV E/e' lateral: 10.2 LV vol d, MOD    95.9 ml A4C: LV vol s, MOD    51.6 ml A2C: LV vol s, MOD    68.8 ml A4C: LV SV MOD A2C:   31.6 ml LV SV MOD A4C:   95.9 ml LV SV MOD BP:    26.9 ml RIGHT VENTRICLE RV S prime:     4.40 cm/s LEFT ATRIUM              Index        RIGHT ATRIUM           Index LA diam:        4.80 cm  2.90 cm/m   RA Area:     28.00 cm LA Vol (A2C):   77.0 ml  46.57 ml/m  RA Volume:   90.40 ml  54.67 ml/m LA Vol (A4C):   108.0 ml 65.31 ml/m LA Biplane Vol: 96.2 ml  58.18 ml/m  AORTIC VALVE AV Area (Vmax):    2.39 cm AV Area (Vmean):   2.30 cm AV Area (VTI):     2.22 cm AV Vmax:           108.00 cm/s AV Vmean:          69.800 cm/s AV VTI:            0.225 m AV Peak Grad:      4.7 mmHg AV Mean Grad:      2.0 mmHg LVOT Vmax:         82.30 cm/s LVOT Vmean:        51.100 cm/s LVOT VTI:          0.159 m LVOT/AV VTI ratio: 0.71  AORTA Ao Root diam: 2.90 cm Ao Asc diam:  3.00 cm MV E velocity: 85.70 cm/s  TRICUSPID VALVE                            TR Peak grad:   22.3 mmHg                             TR Vmax:        236.00 cm/s                             SHUNTS                            Systemic VTI:  0.16 m                            Systemic Diam: 2.00 cm Lyman Bishop MD Electronically signed by Chrissie Noa  Hilty MD Signature Date/Time: 12/11/2021/3:14:02 PM    Final    DG Chest Port 1 View  Result Date: 12/09/2021 CLINICAL DATA:  Chest pain and shortness of breath EXAM: PORTABLE CHEST 1 VIEW COMPARISON:  None Available. FINDINGS: Moderate cardiomegaly. Remote median sternotomy and pacemaker placement. No focal airspace consolidation or pulmonary edema. Advanced osteoarthrosis at both shoulders. IMPRESSION: Moderate cardiomegaly. No acute cardiopulmonary process. Electronically Signed   By: Ulyses Jarred M.D.   On: 12/09/2021 19:39    Microbiology: Results for orders placed or performed during the hospital encounter of 12/09/21  SARS Coronavirus 2 by RT PCR (hospital order, performed in [facility] hospital lab) *cepheid single result test* Anterior Nasal Swab     Status: None   Collection Time: 12/09/21  9:02 PM   Specimen: Anterior Nasal Swab  Result Value Ref Range Status   SARS Coronavirus 2 by RT PCR NEGATIVE NEGATIVE Final    Comment: (NOTE) SARS-CoV-2 target nucleic acids are NOT DETECTED.  The SARS-CoV-2 RNA is generally detectable in upper and lower respiratory specimens during the acute phase of infection. The lowest concentration of SARS-CoV-2 viral copies this assay can detect is 250 copies / mL. A negative result does not preclude SARS-CoV-2 infection and should not be used as the sole basis for treatment or other patient management decisions.  A negative result may occur with improper specimen collection / handling, submission of specimen other than nasopharyngeal swab, presence of viral mutation(s) within the areas targeted by this assay, and inadequate number of viral copies (<250 copies / mL). A negative result must be combined with  clinical observations, patient history, and epidemiological information.  Fact Sheet for Patients:   https://www.patel.info/  Fact Sheet for Healthcare Providers: https://hall.com/  This test is not yet approved or  cleared by the [place] FDA and has been authorized for detection and/or diagnosis of SARS-CoV-2 by FDA under an Emergency Use Authorization (EUA).  This EUA will remain in effect (meaning this test can be used) for the duration of the COVID-19 declaration under Section 564(b)(1) of the Act, 21 U.S.C. section 360bbb-3(b)(1), unless the authorization is terminated or revoked sooner.  Performed at [facility], [address], [place], [place] [address]     Labs: CBC: Recent Labs  Lab 12/09/21 1901 12/11/21 0040  WBC 5.4 5.6  HGB 12.3 13.6  HCT 37.8 40.4  MCV 87.3 86.1  PLT 198 [id number]   Basic Metabolic Panel: Recent Labs  Lab 12/09/21 1901 12/10/21 1509 12/11/21 0040 12/12/21 0051 12/13/21 0041  NA 133*  --  135 132* 135  K 4.0  --  4.3 4.1 4.7  CL 102  --  106 101 104  CO2 23  --  22 24 26   GLUCOSE 85  --  94 97 97  BUN 29*  --  28* 37* 34*  CREATININE 1.51*  --  1.55* 2.15* 1.70*  CALCIUM 10.5*  --  10.5* 10.4* 10.7*  MG  --  2.0  --   --   --    Liver Function Tests: No results for input(s): "AST", "ALT", "ALKPHOS", "BILITOT", "PROT", "ALBUMIN" in the last 168 hours. CBG: No results for input(s): "GLUCAP" in the last 168 hours.  Discharge time spent: greater than 30 minutes.  Signed: Tawni Millers, MD [facility] 12/13/2021

## 2021-12-13 NOTE — Hospital Course (Signed)
Stephanie Atkinson was admitted to the hospital with the working diagnosis of decompensated heart failure.   88 yo female with the past medical history of coronary artery disease, sp CABG, CKD stage 3, hypertension, dyslipidemia, heart failure, bradycardia sp pacemaker and paroxysmal atrial fibrillation who presented with dyspnea. Reported 7 to 10 days of worsening dyspnea, worse with exertion, and decreased physical functional capacity. Her symptoms were persistent despite taking extra furosemide dose at home for 2 consecutive days. On her initial physical examination her blood pressure was 143//64, HR 62, RR 16 and 02 saturation 94% on room air. Lungs with no wheezing, heart with S1 and S2 present and rhythmic with no gallops, abdomen with no distention and trace lower extremity edema.   Na 133, K 4,0 Cl 102 bicarbonate 23, glucose 86, bun 29 cr 1,51 BNP 184  High sensitive troponin 12 and 13  Wbc 5,4 hgb 12,3 plt 198  Sars covid 19 negative   Chest radiograph with mild cardiomegaly, no infiltrates or effusions, pacemaker in place with one ventricular lead. Sternotomy wires in place.   EKG 67 bpm, left axis deviation, left bundle branch block pattern qrs, ventricular paced rhythm 100%, with no significant ST segment or T wave changes.   Patient was placed on furosemide for diuresis.  Echocardiogram with reduced LV systolic function. EP consulted for possible upgrade pacer to biventricular (CRT-P).

## 2021-12-17 ENCOUNTER — Ambulatory Visit (INDEPENDENT_AMBULATORY_CARE_PROVIDER_SITE_OTHER): Payer: Medicare PPO | Admitting: Cardiovascular Disease

## 2021-12-17 ENCOUNTER — Encounter: Payer: Self-pay | Admitting: Cardiovascular Disease

## 2021-12-17 VITALS — BP 108/54 | HR 89 | Ht 63.0 in | Wt 142.2 lb

## 2021-12-17 DIAGNOSIS — I5022 Chronic systolic (congestive) heart failure: Secondary | ICD-10-CM | POA: Diagnosis not present

## 2021-12-17 DIAGNOSIS — Z950 Presence of cardiac pacemaker: Secondary | ICD-10-CM

## 2021-12-17 DIAGNOSIS — I10 Essential (primary) hypertension: Secondary | ICD-10-CM

## 2021-12-17 DIAGNOSIS — I2510 Atherosclerotic heart disease of native coronary artery without angina pectoris: Secondary | ICD-10-CM | POA: Diagnosis not present

## 2021-12-17 DIAGNOSIS — I4821 Permanent atrial fibrillation: Secondary | ICD-10-CM | POA: Diagnosis not present

## 2021-12-17 DIAGNOSIS — E7800 Pure hypercholesterolemia, unspecified: Secondary | ICD-10-CM

## 2021-12-17 MED ORDER — EMPAGLIFLOZIN 10 MG PO TABS: 10.0000 mg | ORAL_TABLET | Freq: Every day | ORAL | 11 refills | Status: DC

## 2021-12-17 MED ORDER — VALSARTAN 80 MG PO TABS: 80.0000 mg | ORAL_TABLET | Freq: Every day | ORAL | 0 refills | Status: DC

## 2021-12-17 NOTE — Patient Instructions (Addendum)
Medication Instructions:  START Jardiance 10 mg once daily  One week after starting the Jardiance, increase the Valsartan to 80 mg once daily  *If you need a refill on your cardiac medications before your next appointment, please call your pharmacy*   Lab Work: None ordered If you have labs (blood work) drawn today and your tests are completely normal, you will receive your results only by: [place] (if you have MyChart) OR A paper copy in the mail If you have any lab test that is abnormal or we need to change your treatment, we will call you to review the results.   Testing/Procedures: None ordered   Follow-Up: At [facility], you and your health needs are our priority.  As part of our continuing mission to provide you with exceptional heart care, we have created designated Provider Care Teams.  These Care Teams include your primary Cardiologist (physician) and Advanced Practice Providers (APPs -  Physician Assistants and Nurse Practitioners) who all work together to provide you with the care you need, when you need it.  We recommend signing up for the patient portal called "MyChart".  Sign up information is provided on this After Visit Summary.  MyChart is used to connect with patients for Virtual Visits (Telemedicine).  Patients are able to view lab/test results, encounter notes, upcoming appointments, etc.  Non-urgent messages can be sent to your provider as well.   To learn more about what you can do with MyChart, go to [email or web address].     Other Instructions Follow up in 2 weeks on a device day with Dr. Sallyanne Kuster Follow up in one month with pharmd

## 2021-12-17 NOTE — Progress Notes (Signed)
Cardiology Office Note:    Date:  12/19/2021   ID:  Stephanie Atkinson, DOB [birth year], MRN [id number]  PCP:  Deon Pilling, NP   [place]  Cardiologist:  Quay Burow, MD Dustine Bertini(pacemaker) Advanced Practice Provider:  No care team member to display Electrophysiologist:  None       Referring MD: Deon Pilling, NP   Chief Complaint  Patient presents with   Congestive Heart Failure    History of Present Illness:    Stephanie Atkinson is a 88 y.o. female with a hx of CAD s/p CABG, permanent atrial fibrillation with slow ventricular response and a history of syncope followed by single-chamber permanent pacemaker (Medtronic Azure XT SR implanted May 2019).  She is here in follow-up after recent hospitalization for congestive heart failure.  This is the first time she has had heart failure.  Echocardiogram showed markedly  reduced left ventricular systolic function, with EF down to 30-35%.  This appears to be large part due to severe pacing induced dyssynchrony.  She is actually had nearly 100% ventricular paced rhythm for the last 3 to 4 years.  She has not had angina pectoris.  She responded very well to enhance diuretic therapy.  Low-dose carvedilol was added while in the hospital.  Note that in the past she took spironolactone in order to help with her hypertension, but we had to reduce diuretic therapy because of renal dysfunction.  Her most recent creatinine was 2.15 on 12/12/2021.  Today we added SGLT2 inhibitor to her medications.  Plan to increase her dose of valsartan.  If this is tolerated we will try to transition to [facility], but we will have to keep a very close eye on her kidney function.. She still has NYHA functional class II-3 exertional dyspnea and may be a little bit of orthopnea.  She does not have any lower extremity edema.  She has not had PND.  She is not aware of any palpitations and has not had dizziness or syncope.  Pacemaker interrogation shows  normal device function.  She is not pacemaker dependent.  The underlying rhythm is atrial fibrillation with a ventricular rate around 45-50 bpm.  She has 99% ventricular pacing.  The heart rate histogram distribution appears appropriate.  There have been no recent episodes of high ventricular rates.  Past Medical History:  Diagnosis Date   Chronic kidney disease, stage 3 (HCC)    Coronary artery disease    Hx of CABG 2006   LIMA-LAD, free RIMA-PDA, Radial-OM1-OM2   Hyperlipidemia    Hypertension    Hypothyroid    MI, old        PAF (paroxysmal atrial fibrillation) (HCC)    was on amio, d/c'd due to prolonged PR interval, rate control   Presence of permanent cardiac pacemaker 07/16/2017   Presence of stent in right coronary artery 07/12   RIMA-PDA occluded, DES stent placed   Syncope 07/12   bradycardic, beta blocker decreased, also felt to be dehydrated    Past Surgical History:  Procedure Laterality Date   CARDIAC CATHETERIZATION  [id number]   dr. Shanon Brow harding, revealing a atretic bypass to her right with patent distal RCA stent   CAROTID STENT  2012   CORONARY ARTERY BYPASS GRAFT  2006   LIMA-LAD, free RIMA-PDA, Radial-OM1-OM2   DOPPLER ECHOCARDIOGRAPHY  [id number]   mild asymmetric left ventricular hypertrophy, left ventricular systolic function is low normal, ejection fraction = 50-55%, the LA is moderate dilated, the RA is  mildly dilated, no significant valvular disease   INSERT / REPLACE / REMOVE PACEMAKER  07/16/2017   JOINT REPLACEMENT     hip replacement   LEFT HEART CATHETERIZATION WITH CORONARY/GRAFT ANGIOGRAM N/A 10/12/2012   Procedure: LEFT HEART CATHETERIZATION WITH Beatrix Fetters;  Surgeon: Leonie Man, MD;  Location: [facility] C[facility] LAB;  Service: Cardiovascular;  Laterality: N/A;   LOOP RECORDER IMPLANT N/A 10/15/2012   Procedure: LOOP RECORDER IMPLANT;  Surgeon: Sanda Klein, MD;  Location: [place] CATH LAB;  Service: Cardiovascular;  Laterality: N/A;   LOOP RECORDER  REMOVAL  07/16/2017   LOOP RECORDER REMOVAL N/A 07/16/2017   Procedure: LOOP RECORDER REMOVAL;  Surgeon: Sanda Klein, MD;  Location: [place] CV LAB;  Service: Cardiovascular;  Laterality: N/A;   NM MYOVIEW LTD  [id number]   post stress left ventricle is normal in size, post stress ejection fraction is 67% global left ventricular systolic function is normal, normal myocardial perfusion study, abnormal myocardial perfusion study, low risk scan   PACEMAKER IMPLANT N/A 07/16/2017   Procedure: PACEMAKER IMPLANT;  Surgeon: Sanda Klein, MD;  Location: [place] CV LAB;  Service: Cardiovascular;  Laterality: N/A;   THYROIDECTOMY      Current Medications: Current Meds  Medication Sig   acetaminophen (TYLENOL) 325 MG tablet Take 325 mg by mouth every 6 (six) hours as needed.   acetaminophen (TYLENOL) 500 MG tablet Take 1 tablet (500 mg total) by mouth every 6 (six) hours as needed for moderate pain. (Patient taking differently: Take 1,000 mg by mouth every 6 (six) hours as needed for moderate pain.)   apixaban (ELIQUIS) 2.5 MG TABS tablet Take 1 tablet (2.5 mg total) by mouth 2 (two) times daily.   atorvastatin (LIPITOR) 80 MG tablet Take 80 mg by mouth at bedtime.    carvedilol (COREG) 3.125 MG tablet Take 1 tablet (3.125 mg total) by mouth 2 (two) times daily with a meal.   empagliflozin (JARDIANCE) 10 MG TABS tablet Take 1 tablet (10 mg total) by mouth daily before breakfast.   furosemide (LASIX) 20 MG tablet Take 1 tablet (20 mg total) by mouth daily. Take to tablets daily in case of weight gain 2 to 3 bs in 24 hrs or 5 lbs in 7 days, until weight returns to baseline.   levothyroxine (SYNTHROID, LEVOTHROID) 50 MCG tablet Take 50 mcg by mouth daily before breakfast.    nitroGLYCERIN (NITROSTAT) 0.4 MG SL tablet Place 1 tablet (0.4 mg total) under the tongue every 5 (five) minutes as needed for chest pain.   Polyethyl Glycol-Propyl Glycol (SYSTANE OP) Place 1 drop into both eyes daily as needed  (dry eye).   traMADol (ULTRAM) 50 MG tablet Take 50 mg by mouth 2 (two) times daily.   [DISCONTINUED] valsartan (DIOVAN) 40 MG tablet Take 1 tablet (40 mg total) by mouth daily.     Allergies:   Penicillins and Sulfa antibiotics   Social History   Socioeconomic History   Marital status: Single    Spouse name: Not on file   Number of children: Not on file   Years of education: Not on file   Highest education level: Not on file  Occupational History   Not on file  Tobacco Use   Smoking status: Never   Smokeless tobacco: Current    Types: Snuff  Vaping Use   Vaping Use: Never used  Substance and Sexual Activity   Alcohol use: No   Drug use: No   Sexual activity: Not on file  Other Topics Concern   Not on file  Social History Narrative   Not on file   Social Determinants of Health   Financial Resource Strain: Not on file  Food Insecurity: No Food Insecurity (12/12/2021)   Hunger Vital Sign    Worried About Running Out of Food in the Last Year: Never true    Ran Out of Food in the Last Year: Never true  Transportation Needs: No Transportation Needs (12/12/2021)   PRAPARE - Hydrologist (Medical): No    Lack of Transportation (Non-Medical): No  Physical Activity: Not on file  Stress: Not on file  Social Connections: Not on file     Family History: The patient's family history includes CAD in her maternal grandmother and mother.  ROS:   Please see the history of present illness.     All other systems reviewed and are negative.  EKGs/Labs/Other Studies Reviewed:    The following studies were reviewed today: Comprehensive pacemaker check in the office today.  EKG:  EKG is not ordered today.  Her most recent ECG shows 100% ventricular paced rhythm on a background of atrial fibrillation and this is also the case on her intracardiac electrogram today.  The QRS complex is measured as 140 ms by the computer, but on my manual measurement is at  least 160 ms. Recent Labs: 12/09/2021: B Natriuretic Peptide 184.4 12/10/2021: Magnesium 2.0; TSH 1.602 12/11/2021: Hemoglobin 13.6; Platelets 206 12/13/2021: BUN 34; Creatinine, Ser 1.70; Potassium 4.7; Sodium 135  Recent Lipid Panel    Component Value Date/Time   CHOL 127 11/25/2016 1005   TRIG 117 11/25/2016 1005   HDL 46 11/25/2016 1005   CHOLHDL 2.8 11/25/2016 1005   CHOLHDL 2.6 01/05/2015 1118   VLDL 18 01/05/2015 1118   LDLCALC 58 11/25/2016 1005   01/12/2020  HDL 45, LDL 51, triglycerides 77 Hemoglobin 12.8, TSH 1.59  Risk Assessment/Calculations:    CHA2DS2-VASc Score = 6 This indicates a 9.7% annual risk of stroke. The patient's score is based upon: CHF History: Yes HTN History: Yes Diabetes History: No Stroke History: No Vascular Disease History: Yes Age Score: 2 Gender Score: 1      Physical Exam:    VS:  BP (!) 108/54 (BP Location: Left Arm, Patient Position: Sitting, Cuff Size: Normal)   Pulse 89   Ht 5\' 3"  (1.6 m)   Wt 64.5 kg   SpO2 98%   BMI 25.19 kg/m     Wt Readings from Last 3 Encounters:  12/17/21 64.5 kg  12/13/21 61.4 kg  11/05/21 63.8 kg      General: Alert, oriented x3, no distress, appears younger than stated age.  Healthy left subclavian pacemaker site Head: no evidence of trauma, PERRL, EOMI, no exophtalmos or lid lag, no myxedema, no xanthelasma; normal ears, nose and oropharynx Neck: normal jugular venous pulsations and no hepatojugular reflux; brisk carotid pulses without delay and no carotid bruits Chest: clear to auscultation, no signs of consolidation by percussion or palpation, normal fremitus, symmetrical and full respiratory excursions Cardiovascular: normal position and quality of the apical impulse, regular rhythm, normal first and second heart sounds, no murmurs, rubs or gallops Abdomen: no tenderness or distention, no masses by palpation, no abnormal pulsatility or arterial bruits, normal bowel sounds, no  hepatosplenomegaly Extremities: no clubbing, cyanosis or edema; 2+ radial, ulnar and brachial pulses bilaterally; 2+ right femoral, posterior tibial and dorsalis pedis pulses; 2+ left femoral, posterior tibial and dorsalis pedis pulses; no  subclavian or femoral bruits Neurological: grossly nonfocal Psych: Normal mood and affect   ASSESSMENT:    1. Chronic systolic heart failure ([place])   2. Coronary artery disease involving native coronary artery of native heart without angina pectoris   3. Permanent atrial fibrillation ([place])   4. Pacemaker   5. Essential hypertension   6. Hypercholesterolemia    PLAN:    In order of problems listed above:  CHF: She does not have overt signs of high right heart filling pressures, but continues to have some shortness of breath suggesting high left atrial filling pressures.  Add SGLT2 inhibitor today.  Increase the dose of valsartan to 80 mg daily after a week.  Recheck labs after that, paying close attention to renal function.  Previously, treatment spironolactone had to be stopped due to worsening renal dysfunction.  She is already on a low-dose of carvedilol, but I do not think now is a good time to titrate this upwards.  Ultimately, she may benefit most from CRT pacing. CAD: Asymptomatic.  She is not on any antianginal medicines other than the recently added low-dose of carvedilol.  It is possible that she has worsening coronary status, but she is not a great candidate for angiography or revascularization due to renal dysfunction.  Had bypass surgery in 2006 and last cardiac catheterization in 2014, all conduits open.  On high-dose statin.  Not on aspirin due to full anticoagulation. AFib: Permanent arrhythmia with slow ventricular response, requiring 100% ventricular pacing even before starting beta-blocker.  On anticoagulants. Pacemaker: Normal device function.  Continue remote downloads every 3 months.  Consider upgrade to CRT-P HTN: Blood pressure is in  low normal range today.  There may not be a lot of room to titrate heart failure medications. HLP: On maximum dose atorvastatin, she has had LDL cholesterol levels in the 50s for the last few years.        Medication Adjustments/Labs and Tests Ordered: Current medicines are reviewed at length with the patient today.  Concerns regarding medicines are outlined above.  Orders Placed This Encounter  Procedures   AMB Referral to [facility] Pharm-D   Meds ordered this encounter  Medications   empagliflozin (JARDIANCE) 10 MG TABS tablet    Sig: Take 1 tablet (10 mg total) by mouth daily before breakfast.    Dispense:  30 tablet    Refill:  11   valsartan (DIOVAN) 80 MG tablet    Sig: Take 1 tablet (80 mg total) by mouth daily.    Dispense:  90 tablet    Refill:  0    Patient Instructions  Medication Instructions:  START Jardiance 10 mg once daily  One week after starting the Jardiance, increase the Valsartan to 80 mg once daily  *If you need a refill on your cardiac medications before your next appointment, please call your pharmacy*   Lab Work: None ordered If you have labs (blood work) drawn today and your tests are completely normal, you will receive your results only by: [facility] (if you have MyChart) OR A paper copy in the mail If you have any lab test that is abnormal or we need to change your treatment, we will call you to review the results.   Testing/Procedures: None ordered   Follow-Up: At [facility], you and your health needs are our priority.  As part of our continuing mission to provide you with exceptional heart care, we have created designated Provider Care Teams.  These Care Teams include  your primary Cardiologist (physician) and Advanced Practice Providers (APPs -  Physician Assistants and Nurse Practitioners) who all work together to provide you with the care you need, when you need it.  We recommend signing up for the patient portal called  "MyChart".  Sign up information is provided on this After Visit Summary.  MyChart is used to connect with patients for Virtual Visits (Telemedicine).  Patients are able to view lab/test results, encounter notes, upcoming appointments, etc.  Non-urgent messages can be sent to your provider as well.   To learn more about what you can do with MyChart, go to [email or web address].     Other Instructions Follow up in 2 weeks on a device day with Dr. Sallyanne Kuster Follow up in one month with pharmd    Signed, Sanda Klein, MD  12/19/2021 3:27 PM    [place]

## 2021-12-18 DIAGNOSIS — Z20822 Contact with and (suspected) exposure to covid-19: Secondary | ICD-10-CM | POA: Diagnosis not present

## 2021-12-19 ENCOUNTER — Encounter: Payer: Self-pay | Admitting: Cardiovascular Disease

## 2021-12-21 ENCOUNTER — Ambulatory Visit: Payer: Self-pay

## 2021-12-21 ENCOUNTER — Ambulatory Visit: Payer: Medicare PPO | Admitting: Surgery

## 2021-12-21 ENCOUNTER — Encounter: Payer: Self-pay | Admitting: Surgery

## 2021-12-21 DIAGNOSIS — G8929 Other chronic pain: Secondary | ICD-10-CM

## 2021-12-21 DIAGNOSIS — M7542 Impingement syndrome of left shoulder: Secondary | ICD-10-CM

## 2021-12-21 DIAGNOSIS — M25512 Pain in left shoulder: Secondary | ICD-10-CM | POA: Diagnosis not present

## 2021-12-21 MED ORDER — LIDOCAINE HCL 1 % IJ SOLN
3.0000 mL | INTRAMUSCULAR | Status: AC | PRN
  Administered 2021-12-21: 3 mL

## 2021-12-21 MED ORDER — BUPIVACAINE HCL 0.25 % IJ SOLN
5.0000 mL | INTRAMUSCULAR | Status: AC | PRN
  Administered 2021-12-21: 5 mL via INTRA_ARTICULAR

## 2021-12-21 MED ORDER — METHYLPREDNISOLONE ACETATE 40 MG/ML IJ SUSP
40.0000 mg | INTRAMUSCULAR | Status: AC | PRN
  Administered 2021-12-21: 40 mg via INTRA_ARTICULAR

## 2021-12-21 NOTE — Progress Notes (Signed)
Office Visit Note   Patient: Stephanie Atkinson           Date of Birth: 04-15-1924           MRN: [id number] Visit Date: 12/21/2021              Requested by: Linus Galas, NP [address] [place],  [place] [address] PCP: Linus Galas, NP   Assessment & Plan: Visit Diagnoses:  1. Chronic left shoulder pain   2. Impingement syndrome of left shoulder     Plan:  In hopes of giving patient relief of her left shoulder pain I offered injection.  After patient consent left shoulder was prepped with Betadine and subacromial Marcaine/Depo-Medrol injection performed.  Patient did have good improvement of her pain with anesthetic in place.  She may have some pain in the glenohumeral joint.  Follow-up with Dr. Ophelia Charter in 3 weeks for recheck of her shoulder and he can also evaluate her lumbar spine.  He may consider Left shoulder glenohumeral intra-articular injection.   Follow-Up Instructions: Return in about 3 weeks (around 01/11/2022) for WITH DR YATES RECHECK LEFT SHOULDER AND BACK PAIN.   Orders:  Orders Placed This Encounter  Procedures   Large Joint Inj   XR Shoulder Left   No orders of the defined types were placed in this encounter.     Procedures: Large Joint Inj: L subacromial bursa on 12/21/2021 3:25 PM Indications: pain Details: 25 G 1.5 in needle, posterior approach Medications: 3 mL lidocaine 1 %; 5 mL bupivacaine 0.25 %; 40 mg methylPREDNISolone acetate 40 MG/ML Consent was given by the patient. Patient was prepped and draped in the usual sterile fashion.       Clinical Data: No additional findings.   Subjective: Chief Complaint  Patient presents with   Left Shoulder - Pain    HPI 88 year old female comes in with complaints of left shoulder pain.  Previously seen by Dr Ophelia Charter in 2021 for left shoulder pain.  Pain worse over the last few months.  Aggravated with overhead activity.  No cervical spine or Radicular component.  No injury Review of  Systems No complaints of cardiopulmonary GI/GU issues  Objective: Vital Signs: There were no vitals taken for this visit.  Physical Exam HENT:     Head: Normocephalic and atraumatic.  Eyes:     Extraocular Movements: Extraocular movements intact.  Pulmonary:     Effort: No respiratory distress.  Musculoskeletal:     Comments: Left shoulder flexion/abduction to 90 degrees.  Positive impingement test.  Positive shoulder crepitus.  Neurological:     Mental Status: She is alert and oriented to person, place, and time.  Psychiatric:        Mood and Affect: Mood normal.     Ortho Exam  Specialty Comments:  No specialty comments available.  Imaging: No results found.   PMFS History: Patient Active Problem List   Diagnosis Date Noted   CHF (congestive heart failure) (HCC) 12/11/2021   Acute on chronic systolic CHF (congestive heart failure) (HCC) 12/09/2021   Venous stasis dermatitis of both lower extremities 01/06/2020   Unilateral primary osteoarthritis, right knee 01/06/2020   Community acquired bacterial pneumonia 06/23/2019   AKI (acute kidney injury) (HCC) 06/23/2019   Synovitis of right knee 03/02/2019   Chronic diastolic heart failure (HCC) 09/14/2018   Pacemaker 07/16/2017   Atrial fibrillation with slow ventricular response (HCC) 06/27/2017   Hypercholesterolemia 06/27/2017   Symptomatic bradycardia s/p PPM  06/27/2017  Chronic pain of right knee 05/17/2016   De Quervain's tenosynovitis 05/17/2016   CAD (coronary artery disease) of artery bypass graft 02/13/2015   Encounter for loop recorder check 02/01/2014   Accelerated junctional rhythm 05/04/2013   Dehydration 10/14/2012   Syncope and collapse 10/13/2012   History of first degree atrioventricular block 10/11/2012   Long term (current) use of anticoagulants 10/06/2012   AVB - beta blocker and Amiodarone stopped 07/10/2012   Hypertension    Hx of CABG X 4 3/06-     Dyslipidemia    RCA DES placed 7/12-  patent 8/14    Permanent atrial fibrillation ([place])    Acute kidney injury superimposed on chronic kidney disease ([place])    Past Medical History:  Diagnosis Date   Chronic kidney disease, stage 3 ([place])    Coronary artery disease    Hx of CABG 2006   LIMA-LAD, free RIMA-PDA, Radial-OM1-OM2   Hyperlipidemia    Hypertension    Hypothyroid    MI, old        PAF (paroxysmal atrial fibrillation) (HCC)    was on amio, d/c'd due to prolonged PR interval, rate control   Presence of permanent cardiac pacemaker 07/16/2017   Presence of stent in right coronary artery 07/12   RIMA-PDA occluded, DES stent placed   Syncope 07/12   bradycardic, beta blocker decreased, also felt to be dehydrated    Family History  Problem Relation Age of Onset   CAD Mother    CAD Maternal Grandmother     Past Surgical History:  Procedure Laterality Date   CARDIAC CATHETERIZATION  [phone]   dr. Shanon Brow harding, revealing a atretic bypass to her right with patent distal RCA stent   CAROTID STENT  2012   CORONARY ARTERY BYPASS GRAFT  2006   LIMA-LAD, free RIMA-PDA, Radial-OM1-OM2   DOPPLER ECHOCARDIOGRAPHY  [id number]   mild asymmetric left ventricular hypertrophy, left ventricular systolic function is low normal, ejection fraction = 50-55%, the LA is moderate dilated, the RA is mildly dilated, no significant valvular disease   INSERT / REPLACE / REMOVE PACEMAKER  07/16/2017   JOINT REPLACEMENT     hip replacement   LEFT HEART CATHETERIZATION WITH CORONARY/GRAFT ANGIOGRAM N/A 10/12/2012   Procedure: LEFT HEART CATHETERIZATION WITH Beatrix Fetters;  Surgeon: Leonie Man, MD;  Location: [facility] CATH LAB;  Service: Cardiovascular;  Laterality: N/A;   LOOP RECORDER IMPLANT N/A 10/15/2012   Procedure: LOOP RECORDER IMPLANT;  Surgeon: Sanda Klein, MD;  Location: [facility] CATH LAB;  Service: Cardiovascular;  Laterality: N/A;   LOOP RECORDER REMOVAL  07/16/2017   LOOP RECORDER REMOVAL N/A 07/16/2017   Procedure: LOOP  RECORDER REMOVAL;  Surgeon: Sanda Klein, MD;  Location: [place] CV LAB;  Service: Cardiovascular;  Laterality: N/A;   NM MYOVIEW LTD  [id number]   post stress left ventricle is normal in size, post stress ejection fraction is 67% global left ventricular systolic function is normal, normal myocardial perfusion study, abnormal myocardial perfusion study, low risk scan   PACEMAKER IMPLANT N/A 07/16/2017   Procedure: PACEMAKER IMPLANT;  Surgeon: Sanda Klein, MD;  Location: [place] CV LAB;  Service: Cardiovascular;  Laterality: N/A;   THYROIDECTOMY     Social History   Occupational History   Not on file  Tobacco Use   Smoking status: Never   Smokeless tobacco: Current    Types: Snuff  Vaping Use   Vaping Use: Never used  Substance and Sexual Activity   Alcohol use: No  Drug use: No   Sexual activity: Not on file

## 2021-12-31 ENCOUNTER — Telehealth: Payer: Self-pay | Admitting: Cardiovascular Disease

## 2021-12-31 ENCOUNTER — Other Ambulatory Visit: Payer: Self-pay | Admitting: Cardiovascular Disease

## 2021-12-31 DIAGNOSIS — E559 Vitamin D deficiency, unspecified: Secondary | ICD-10-CM | POA: Diagnosis not present

## 2021-12-31 DIAGNOSIS — N1832 Chronic kidney disease, stage 3b: Secondary | ICD-10-CM | POA: Diagnosis not present

## 2021-12-31 DIAGNOSIS — E063 Autoimmune thyroiditis: Secondary | ICD-10-CM | POA: Diagnosis not present

## 2021-12-31 DIAGNOSIS — E785 Hyperlipidemia, unspecified: Secondary | ICD-10-CM | POA: Diagnosis not present

## 2021-12-31 DIAGNOSIS — R7303 Prediabetes: Secondary | ICD-10-CM | POA: Diagnosis not present

## 2021-12-31 NOTE — Telephone Encounter (Signed)
*  STAT* If patient is at the pharmacy, call can be transferred to refill team.   1. Which medications need to be refilled? (please list name of each medication and dose if known) apixaban (ELIQUIS) 2.5 MG TABS tablet   2. Which pharmacy/location (including street and city if local pharmacy) is medication to be sent to?  [id number] - , [place] - [address]    3. Do they need a 30 day or 90 day supply? [place]

## 2022-01-01 ENCOUNTER — Other Ambulatory Visit: Payer: Self-pay

## 2022-01-01 MED ORDER — APIXABAN 2.5 MG PO TABS: 2.5000 mg | ORAL_TABLET | Freq: Two times a day (BID) | ORAL | 1 refills | Status: DC

## 2022-01-01 NOTE — Telephone Encounter (Signed)
Prescription refill request for Eliquis received. Indication:afib Last office visit:10/23 Scr:1.7 Age: 88 Weight:64.5 kg  Prescription refilled

## 2022-01-02 ENCOUNTER — Telehealth: Payer: Self-pay | Admitting: Cardiovascular Disease

## 2022-01-02 NOTE — Telephone Encounter (Signed)
Patient's daughter is calling stating the patient's 2 week pacer f/u scheduled on 11/13 needs to be rescheduled. [name] has to come from [place] to [place] to get the patient, and the patient does not wake up at 8:00 due to her age. The appt was not cancelled due to there being no upcoming availability before January, when it was requested to be in a 2 week time frame at her last appt with Dr. Salena Saner. Please advise.

## 2022-01-02 NOTE — Telephone Encounter (Signed)
Hey Dr.C- do you know of when we could maybe get her in for a later time? I was unsure of how you like to schedule these.   Thank you!

## 2022-01-02 NOTE — Telephone Encounter (Signed)
*  STAT* If patient is at the pharmacy, call can be transferred to refill team.   1. Which medications need to be refilled? (please list name of each medication and dose if known) valsartan (DIOVAN) 80 MG tablet  furosemide (LASIX) 20 MG tablet   2. Which pharmacy/location (including street and city if local pharmacy) is medication to be sent to? [id number] - [place], [place] - [address]  3. Do they need a 30 day or 90 day supply? 90 day supply

## 2022-01-03 NOTE — Telephone Encounter (Signed)
Unfortunately, I do not want to add her on at the end of the schedule since have a pacemaker implantation procedure early that afternoon.  Not sure we can get her to swap with the one of the other patients on that day.  Just tell them to come in as early as they reasonably can and will squeeze her into the schedule somehow.

## 2022-01-04 ENCOUNTER — Telehealth: Payer: Self-pay | Admitting: Cardiovascular Disease

## 2022-01-04 MED ORDER — CARVEDILOL 3.125 MG PO TABS: 3.1250 mg | ORAL_TABLET | Freq: Two times a day (BID) | ORAL | 3 refills | Status: DC

## 2022-01-04 MED ORDER — FUROSEMIDE 20 MG PO TABS: 20.0000 mg | ORAL_TABLET | Freq: Every day | ORAL | 3 refills | Status: DC

## 2022-01-04 MED ORDER — VALSARTAN 80 MG PO TABS: 80.0000 mg | ORAL_TABLET | Freq: Every day | ORAL | 3 refills | Status: DC

## 2022-01-04 NOTE — Telephone Encounter (Signed)
Called daughter LVM to advise of message from Dr.C to come as early as they could on Monday morning. LVM to call back if questions/concerns. Left call back number.

## 2022-01-04 NOTE — Telephone Encounter (Signed)
*  STAT* If patient is at the pharmacy, call can be transferred to refill team.   1. Which medications need to be refilled? (please list name of each medication and dose if known)  carvedilol (COREG) 3.125 MG tablet  2. Which pharmacy/location (including street and city if local pharmacy) is medication to be sent to? [id number] - [place], [place] - [address]  3. Do they need a 30 day or 90 day supply?  90 day supply

## 2022-01-07 ENCOUNTER — Ambulatory Visit: Payer: Medicare PPO | Admitting: Cardiovascular Disease

## 2022-01-07 ENCOUNTER — Telehealth: Payer: Self-pay | Admitting: Cardiovascular Disease

## 2022-01-07 ENCOUNTER — Encounter: Payer: Self-pay | Admitting: Cardiovascular Disease

## 2022-01-07 ENCOUNTER — Ambulatory Visit: Payer: Medicare PPO | Attending: Cardiovascular Disease | Admitting: Cardiovascular Disease

## 2022-01-07 DIAGNOSIS — I480 Paroxysmal atrial fibrillation: Secondary | ICD-10-CM | POA: Diagnosis not present

## 2022-01-07 DIAGNOSIS — N1832 Chronic kidney disease, stage 3b: Secondary | ICD-10-CM | POA: Diagnosis not present

## 2022-01-07 DIAGNOSIS — R600 Localized edema: Secondary | ICD-10-CM | POA: Diagnosis not present

## 2022-01-07 DIAGNOSIS — Z23 Encounter for immunization: Secondary | ICD-10-CM | POA: Diagnosis not present

## 2022-01-07 DIAGNOSIS — F419 Anxiety disorder, unspecified: Secondary | ICD-10-CM | POA: Diagnosis not present

## 2022-01-07 DIAGNOSIS — F5101 Primary insomnia: Secondary | ICD-10-CM | POA: Diagnosis not present

## 2022-01-07 DIAGNOSIS — G8929 Other chronic pain: Secondary | ICD-10-CM | POA: Diagnosis not present

## 2022-01-07 DIAGNOSIS — R7303 Prediabetes: Secondary | ICD-10-CM | POA: Diagnosis not present

## 2022-01-07 DIAGNOSIS — E039 Hypothyroidism, unspecified: Secondary | ICD-10-CM | POA: Diagnosis not present

## 2022-01-07 NOTE — Telephone Encounter (Signed)
  Pt c/o medication issue:  1. Name of Medication: Amlodipine and  spironolactone   valsartan (DIOVAN) 80 MG tablet    2. How are you currently taking this medication (dosage and times per day)? As written   3. Are you having a reaction (difficulty breathing--STAT)? No   4. What is your medication issue? Pt would like to ask of she needs to take Amlodipine and  spironolactone. Also, her phramcy told her its too soon to get valsartan and she is requesting to send a new prescription since Dr. Salena Saner increased it to 80 mg

## 2022-01-07 NOTE — Telephone Encounter (Signed)
Left a message for the patient to call back.  The patient no showed her follow up with Dr. Royann Shivers today. She will need to reschedule.

## 2022-01-07 NOTE — Telephone Encounter (Signed)
Pt daughter returning a call  

## 2022-01-08 ENCOUNTER — Encounter: Payer: Self-pay | Admitting: Cardiovascular Disease

## 2022-01-08 MED ORDER — CARVEDILOL 3.125 MG PO TABS: 3.1250 mg | ORAL_TABLET | Freq: Two times a day (BID) | ORAL | 3 refills | Status: DC

## 2022-01-11 ENCOUNTER — Ambulatory Visit (INDEPENDENT_AMBULATORY_CARE_PROVIDER_SITE_OTHER): Payer: Medicare PPO

## 2022-01-11 ENCOUNTER — Ambulatory Visit: Payer: Medicare PPO | Admitting: Orthopaedic Surgery

## 2022-01-11 ENCOUNTER — Ambulatory Visit: Payer: Medicare PPO | Admitting: Surgery

## 2022-01-11 DIAGNOSIS — I4430 Unspecified atrioventricular block: Secondary | ICD-10-CM

## 2022-01-11 LAB — CUP PACEART REMOTE DEVICE CHECK
Battery Remaining Longevity: 89 mo
Battery Voltage: 2.98 V
Brady Statistic RV Percent Paced: 99.89 %
Date Time Interrogation Session: 20231116234442
Implantable Lead Connection Status: 753985
Implantable Lead Implant Date: 20190522
Implantable Lead Location: 753860
Implantable Lead Model: 5076
Implantable Pulse Generator Implant Date: 20190522
Lead Channel Impedance Value: 323 Ohm
Lead Channel Impedance Value: 418 Ohm
Lead Channel Pacing Threshold Amplitude: 0.75 V
Lead Channel Pacing Threshold Pulse Width: 0.4 ms
Lead Channel Sensing Intrinsic Amplitude: 7.375 mV
Lead Channel Sensing Intrinsic Amplitude: 7.375 mV
Lead Channel Setting Pacing Amplitude: 2.5 V
Lead Channel Setting Pacing Pulse Width: 0.4 ms
Lead Channel Setting Sensing Sensitivity: 1.2 mV
Zone Setting Status: 755011

## 2022-01-20 NOTE — Progress Notes (Unsigned)
Patient ID: [name]                 DOB: 01/09/1925                      MRN: [id number]     HPI: Stephanie Atkinson is a 88 y.o. female referred by Dr. Sallyanne Kuster to pharmacy clinic for HF medication management. PMH is significant for CAD s/p CABG x 4 in 2006, s/p DES-PDA in 2012, paroxysmal atrial fibrillation, syncope s/p ILR in 2014, symptomatic bradycardia s/p permanent transvenous pacemaker in 2019, hypertension, hyperlipidemia, CKD stage III, and hypothyroidism. Most recent LVEF 30-35% on 12/11/2021.  Patient presented today with her daughter. [name] feels fine tolerates Jardiance and 80 mg valsartan well without side effects. Denies dizziness, SOB at rest,chest pain or swelling. Reports she get short of breath only when she does lots moving - e.g.making up bed but able to complete all ADLs.Does not weigh herself but reports no swelling -has been taking furosemide 20 mg daily- not needing the extra dose of furosemide. She adheres to a low-salt diet. Discussion with patient today included the following: cardiac medication indications, introduction to [facility], reasoning behind medication titration, importance of medication adherence, and patient engagement.    Home BP 138/71 65   Current CHF meds: Jardiance 10 mg, valsartan 80 mg daily and carvedilol 3.125 mg twice daily, furosemide 20 mg daily  Adherence Assessment  Do you ever forget to take your medication? [] Yes [x] No  Do you ever skip doses due to side effects? [] Yes [x] No  Do you have trouble affording your medicines? [] Yes [x] No  Are you ever unable to pick up your medication due to transportation difficulties? [] Yes [x] No  Do you ever stop taking your medications because you don't believe they are helping? [] Yes [x] No  Do you check your weight daily? 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  Past Medical History:  Diagnosis Date   Chronic kidney disease, stage 3 ([facility])    Coronary artery disease    Hx of CABG 2006   LIMA-LAD, free RIMA-PDA, Radial-OM1-OM2   Hyperlipidemia    Hypertension    Hypothyroid    MI, old        PAF (paroxysmal atrial fibrillation) (HCC)    was on amio, d/c'd due to prolonged PR interval, rate control   Presence of permanent cardiac pacemaker 07/16/2017   Presence of stent in right coronary artery 07/12   RIMA-PDA occluded, DES stent placed   Syncope 07/12   bradycardic, beta blocker decreased, also felt to be dehydrated    Current Outpatient Medications on File Prior to Visit  Medication Sig Dispense Refill   acetaminophen (TYLENOL) 325 MG tablet Take 325 mg  by mouth every 6 (six) hours as needed.     acetaminophen (TYLENOL) 500 MG tablet Take 1 tablet (500 mg total) by mouth every 6 (six) hours as needed for moderate pain. (Patient taking differently: Take 1,000 mg by mouth every 6 (six) hours as needed for moderate pain.) 30 tablet 0   apixaban (ELIQUIS) 2.5 MG TABS tablet Take 1 tablet (2.5 mg total) by mouth 2 (two) times daily. 180 tablet 1   atorvastatin (LIPITOR) 80 MG tablet Take 80 mg by mouth at bedtime.      carvedilol (COREG) 3.125 MG tablet Take 1 tablet (3.125 mg total) by mouth 2 (two) times daily with a meal. 90 tablet 3   cyclobenzaprine (FLEXERIL) 10 MG tablet Take 5-10 mg by mouth daily as needed for muscle spasms.      diphenhydrAMINE (BENADRYL) 25 MG tablet Take 25 mg by mouth every 6 (six) hours as needed for allergies.     empagliflozin (JARDIANCE) 10 MG TABS tablet Take 1 tablet (10 mg total) by mouth daily before breakfast. 30 tablet 11   furosemide (LASIX) 20 MG tablet Take 1 tablet (20 mg total) by mouth daily. 90 tablet 3   levothyroxine (SYNTHROID, LEVOTHROID) 50 MCG tablet Take 50 mcg by mouth daily before breakfast.      lidocaine (LIDODERM) 5 % Place 1 patch onto the skin daily. Remove & Discard patch within 12 hours or as directed by MD 30 patch 0   nitroGLYCERIN (NITROSTAT) 0.4 MG SL tablet Place 1 tablet (0.4 mg total) under the tongue every 5 (five) minutes as needed for chest pain. 25 tablet 6   Polyethyl Glycol-Propyl Glycol (SYSTANE OP) Place 1 drop into both eyes daily as needed (dry eye).     traMADol (ULTRAM) 50 MG tablet Take 50 mg by mouth 2 (two) times daily.     No current facility-administered medications on file prior to visit.    Allergies  Allergen Reactions   Penicillins Hives    Did it involve swelling of the face/tongue/throat, SOB, or low BP? No Did it involve sudden or severe rash/hives, skin peeling, or any reaction on the inside of your mouth or nose? No Did you need to seek medical attention at a hospital or doctor's office? No When did it last happen? Unk    If all above answers are "NO", may proceed with cephalosporin use.    Sulfa Antibiotics Hives and Swelling      CHF (congestive heart failure) (HCC) Assessment:  BP in office 132/71 heart rate of 65 last EF 30-35% 12/11/2021 Patient home BP on validated cuff ~138/71 heart rate 65-68 Patient takes the medications regularly and tolerates them well without any side effects - Jardiance 10 mg daily,valsartan 80 mg daily and carvedilol 3.125 mg twice daily, furosemide 20 mg daily Denies Denies dizziness, SOB,chest pain or swelling. complete all ADLs without any trouble Adheres to a low-salt diet Patient is in  agreement to optimize the HF medications- will switch ARB to ARNI  Given CrCl of <30 limit the initiation of MRA (spironolactone)   Plan: Continue taking carvedilol 3.125 mg twice daily, Jardiance 10 mg daily and furosemide 20 mg daily Stop taking Valsartan 80 mg daily and start taking Entresto 24/26 mg twice daily  Continue checking BP at home and bring log at the next visit in 4 weeks with PharmD Follow up lab- BMP in 3-4 weeks     Thank you,  Carmela Hurt, Pharm.D

## 2022-01-21 ENCOUNTER — Encounter: Payer: Self-pay | Admitting: Pharmacist

## 2022-01-21 ENCOUNTER — Ambulatory Visit: Payer: Medicare PPO

## 2022-01-21 ENCOUNTER — Ambulatory Visit: Payer: Medicare PPO | Attending: Cardiology | Admitting: Student

## 2022-01-21 VITALS — BP 132/71 | HR 65

## 2022-01-21 DIAGNOSIS — R8290 Unspecified abnormal findings in urine: Secondary | ICD-10-CM | POA: Diagnosis not present

## 2022-01-21 DIAGNOSIS — I5022 Chronic systolic (congestive) heart failure: Secondary | ICD-10-CM

## 2022-01-21 DIAGNOSIS — I5032 Chronic diastolic (congestive) heart failure: Secondary | ICD-10-CM | POA: Diagnosis not present

## 2022-01-21 DIAGNOSIS — I5023 Acute on chronic systolic (congestive) heart failure: Secondary | ICD-10-CM

## 2022-01-21 MED ORDER — ENTRESTO 24-26 MG PO TABS: 1.0000 | ORAL_TABLET | Freq: Two times a day (BID) | ORAL | 11 refills | Status: DC

## 2022-01-21 NOTE — Assessment & Plan Note (Signed)
Assessment:  BP in office 132/71 heart rate of 65 last EF 30-35% 12/11/2021 Patient home BP on validated cuff ~138/71 heart rate 65-68 Patient takes the medications regularly and tolerates them well without any side effects - Jardiance 10 mg daily,valsartan 80 mg daily and carvedilol 3.125 mg twice daily, furosemide 20 mg daily Denies Denies dizziness, SOB,chest pain or swelling. complete all ADLs without any trouble Adheres to a low-salt diet Patient is in agreement to optimize the HF medications- will switch ARB to ARNI  Given CrCl of <30 limit the initiation of MRA (spironolactone)   Plan: Continue taking carvedilol 3.125 mg twice daily, Jardiance 10 mg daily and furosemide 20 mg daily Stop taking Valsartan 80 mg daily and start taking Entresto 24/26 mg twice daily  Continue checking BP at home and bring log at the next visit in 4 weeks with PharmD Follow up lab- BMP in 3-4 weeks

## 2022-01-21 NOTE — Patient Instructions (Addendum)
Changes made by your pharmacist Carmela Hurt, PharmD at today's visit:    Instructions/Changes  (what do you need to do) Your Notes  (what you did and when you did it)  Continue taking carvedilol 3.125 mg twice daily, Furosemide 20 mg  and Jardiance 10 mg daily    2.  Stop valsartan 80 mg daily and start taking Entresto 24/26 mg twice daily     Bring all of your meds, your BP cuff and your record of home blood pressures to your next appointment.    HOW TO TAKE YOUR BLOOD PRESSURE AT HOME  Rest 5 minutes before taking your blood pressure.  Don't smoke or drink caffeinated beverages for at least 30 minutes before. Take your blood pressure before (not after) you eat. Sit comfortably with your back supported and both feet on the floor (don't cross your legs). Elevate your arm to heart level on a table or a desk. Use the proper sized cuff. It should fit smoothly and snugly around your bare upper arm. There should be enough room to slip a fingertip under the cuff. The bottom edge of the cuff should be 1 inch above the crease of the elbow. Ideally, take 3 measurements at one sitting and record the average.  Important lifestyle changes to control high blood pressure  Intervention  Effect on the BP  Lose extra pounds and watch your waistline Weight loss is one of the most effective lifestyle changes for controlling blood pressure. If you're overweight or obese, losing even a small amount of weight can help reduce blood pressure. Blood pressure might go down by about 1 millimeter of mercury (mm Hg) with each kilogram (about 2.2 pounds) of weight lost.  Exercise regularly As a general goal, aim for at least 30 minutes of moderate physical activity every day. Regular physical activity can lower high blood pressure by about 5 to 8 mm Hg.  Eat a healthy diet Eating a diet rich in whole grains, fruits, vegetables, and low-fat dairy products and low in saturated fat and cholesterol. A healthy diet  can lower high blood pressure by up to 11 mm Hg.  Reduce salt (sodium) in your diet Even a small reduction of sodium in the diet can improve heart health and reduce high blood pressure by about 5 to 6 mm Hg.  Limit alcohol One drink equals 12 ounces of beer, 5 ounces of wine, or 1.5 ounces of 80-proof liquor.  Limiting alcohol to less than one drink a day for women or two drinks a day for men can help lower blood pressure by about 4 mm Hg.   If you have any questions or concerns please use My Chart to send questions or call the office at [phone]

## 2022-01-22 ENCOUNTER — Ambulatory Visit: Payer: Medicare PPO | Admitting: Orthopaedic Surgery

## 2022-01-23 MED ORDER — ENTRESTO 24-26 MG PO TABS: 1.0000 | ORAL_TABLET | Freq: Two times a day (BID) | ORAL | 11 refills | Status: DC

## 2022-01-24 ENCOUNTER — Ambulatory Visit: Payer: Medicare PPO | Admitting: Surgery

## 2022-01-24 ENCOUNTER — Encounter: Payer: Self-pay | Admitting: Surgery

## 2022-01-24 VITALS — BP 128/67 | HR 83 | Ht 63.0 in | Wt 142.0 lb

## 2022-01-24 DIAGNOSIS — M19012 Primary osteoarthritis, left shoulder: Secondary | ICD-10-CM

## 2022-01-24 DIAGNOSIS — M1712 Unilateral primary osteoarthritis, left knee: Secondary | ICD-10-CM

## 2022-01-24 MED ORDER — METHYLPREDNISOLONE ACETATE 40 MG/ML IJ SUSP
40.0000 mg | INTRAMUSCULAR | Status: AC | PRN
  Administered 2022-01-24: 40 mg via INTRA_ARTICULAR

## 2022-01-24 MED ORDER — LIDOCAINE HCL 1 % IJ SOLN
3.0000 mL | INTRAMUSCULAR | Status: AC | PRN
  Administered 2022-01-24: 3 mL

## 2022-01-24 MED ORDER — BUPIVACAINE HCL 0.25 % IJ SOLN
6.0000 mL | INTRAMUSCULAR | Status: AC | PRN
  Administered 2022-01-24: 6 mL via INTRA_ARTICULAR

## 2022-01-24 NOTE — Progress Notes (Signed)
Office Visit Note   Patient: Stephanie Atkinson           Date of Birth: Sep 19, 1924           MRN: [id number] Visit Date: 01/24/2022              Requested by: Linus Galas, NP [address] [place],  [place] [address] PCP: Linus Galas, NP   Assessment & Plan: Visit Diagnoses:  1. Unilateral primary osteoarthritis, left knee   2. Arthritis of left shoulder region     Plan: In hopes given patient improvement of her left knee pain offered injection.  After patient consent left knee was prepped Betadine and intra-articular Marcaine/Depo-Medrol 6 to 1 injection performed.  I will send patient to Dr. Shon Baton next week for ultrasound-guided left shoulder glenohumeral intra-articular injection.  I advised patient and granddaughter who is present that I think it is very reasonable to perform left shoulder, left knee injection every 5 to 6 months to give better quality of life.  Patient is not an operative candidate for any of the areas addressed today.  She voices understanding.  Follow-Up Instructions: Return in about 2 weeks (around 02/07/2022) for WITH DR BROOKS FOR US GUIDED LEFT SHOULDER GLENOHUMERAL INJECTION.   Orders:  No orders of the defined types were placed in this encounter.  No orders of the defined types were placed in this encounter.     Procedures: Large Joint Inj: L knee on 01/24/2022 2:20 PM Indications: pain and joint swelling Details: 25 G 1.5 in needle, anteromedial approach Medications: 3 mL lidocaine 1 %; 40 mg methylPREDNISolone acetate 40 MG/ML; 6 mL bupivacaine 0.25 % Consent was given by the patient. Patient was prepped and draped in the usual sterile fashion.       Clinical Data: No additional findings.   Subjective: Chief Complaint  Patient presents with   Left Leg - Pain    HPI 88 year old female comes in today with complaints of left shoulder pain and left knee pain.  Patient has known history of end-stage DJD left shoulder and left  knee.  I have injected the left knee in the past and this did give good temporary relief.  Pain when she is weightbearing.  Patient was last seen by me December 21, 2021 and I performed left shoulder subacromial injection.  This helped pain for a little while but I had also mention if she did not get long-term improvement that the next step could be glenohumeral intra-articular injection. Review of Systems No current complaints of cardiopulmonary GI/GU issues  Objective: Vital Signs: BP 128/67 (BP Location: Right Arm, Patient Position: Sitting, Cuff Size: Normal)   Pulse 83   Ht 5\' 3"  (1.6 m)   Wt 142 lb (64.4 kg)   BMI 25.15 kg/m   Physical Exam HENT:     Head: Normocephalic and atraumatic.  Eyes:     Extraocular Movements: Extraocular movements intact.  Musculoskeletal:     Comments: Gait is antalgic with a single prong cane.  Exam left knee shows some swelling without large effusion.  Medial lateral joint line tenderness.  Positive crepitus.  Neurological:     Mental Status: She is alert.  Psychiatric:        Mood and Affect: Mood normal.     Ortho Exam  Specialty Comments:  No specialty comments available.  Imaging: No results found.   PMFS History: Patient Active Problem List   Diagnosis Date Noted   CHF (congestive heart  failure) (HCC) 12/11/2021   Acute on chronic systolic CHF (congestive heart failure) (HCC) 12/09/2021   Venous stasis dermatitis of both lower extremities 01/06/2020   Unilateral primary osteoarthritis, right knee 01/06/2020   Community acquired bacterial pneumonia 06/23/2019   AKI (acute kidney injury) (HCC) 06/23/2019   Synovitis of right knee 03/02/2019   Chronic diastolic heart failure (HCC) 09/14/2018   Pacemaker 07/16/2017   Atrial fibrillation with slow ventricular response (HCC) 06/27/2017   Hypercholesterolemia 06/27/2017   Symptomatic bradycardia s/p PPM  06/27/2017   Chronic pain of right knee 05/17/2016   De Quervain's tenosynovitis  05/17/2016   CAD (coronary artery disease) of artery bypass graft 02/13/2015   Encounter for loop recorder check 02/01/2014   Accelerated junctional rhythm 05/04/2013   Dehydration 10/14/2012   Syncope and collapse 10/13/2012   History of first degree atrioventricular block 10/11/2012   Long term (current) use of anticoagulants 10/06/2012   AVB - beta blocker and Amiodarone stopped 07/10/2012   Hypertension    Hx of CABG X 4 3/06-     Dyslipidemia    RCA DES placed 7/12- patent 8/14    Permanent atrial fibrillation (HCC)    Acute kidney injury superimposed on chronic kidney disease (HCC)    Past Medical History:  Diagnosis Date   Chronic kidney disease, stage 3 (HCC)    Coronary artery disease    Hx of CABG 2006   LIMA-LAD, free RIMA-PDA, Radial-OM1-OM2   Hyperlipidemia    Hypertension    Hypothyroid    MI, old        PAF (paroxysmal atrial fibrillation) (HCC)    was on amio, d/c'd due to prolonged PR interval, rate control   Presence of permanent cardiac pacemaker 07/16/2017   Presence of stent in right coronary artery 07/12   RIMA-PDA occluded, DES stent placed   Syncope 07/12   bradycardic, beta blocker decreased, also felt to be dehydrated    Family History  Problem Relation Age of Onset   CAD Mother    CAD Maternal Grandmother     Past Surgical History:  Procedure Laterality Date   CARDIAC CATHETERIZATION  [phone]   dr. Onalee Hua harding, revealing a atretic bypass to her right with patent distal RCA stent   CAROTID STENT  2012   CORONARY ARTERY BYPASS GRAFT  2006   LIMA-LAD, free RIMA-PDA, Radial-OM1-OM2   DOPPLER ECHOCARDIOGRAPHY  [id number]   mild asymmetric left ventricular hypertrophy, left ventricular systolic function is low normal, ejection fraction = 50-55%, the LA is moderate dilated, the RA is mildly dilated, no significant valvular disease   INSERT / REPLACE / REMOVE PACEMAKER  07/16/2017   JOINT REPLACEMENT     hip replacement   LEFT HEART CATHETERIZATION  WITH CORONARY/GRAFT ANGIOGRAM N/A 10/12/2012   Procedure: LEFT HEART CATHETERIZATION WITH Isabel Caprice;  Surgeon: Marykay Lex, MD;  Location: [facility] CATH LAB;  Service: Cardiovascular;  Laterality: N/A;   LOOP RECORDER IMPLANT N/A 10/15/2012   Procedure: LOOP RECORDER IMPLANT;  Surgeon: Thurmon Fair, MD;  Location: [facility] CATH LAB;  Service: Cardiovascular;  Laterality: N/A;   LOOP RECORDER REMOVAL  07/16/2017   LOOP RECORDER REMOVAL N/A 07/16/2017   Procedure: LOOP RECORDER REMOVAL;  Surgeon: Thurmon Fair, MD;  Location: [facility] INVASIVE CV LAB;  Service: Cardiovascular;  Laterality: N/A;   NM MYOVIEW LTD  100510   post stress left ventricle is normal in size, post stress ejection fraction is 67% global left ventricular systolic function is normal, normal myocardial perfusion study,  abnormal myocardial perfusion study, low risk scan   PACEMAKER IMPLANT N/A 07/16/2017   Procedure: PACEMAKER IMPLANT;  Surgeon: Thurmon Fair, MD;  Location: [facility] INVASIVE CV LAB;  Service: Cardiovascular;  Laterality: N/A;   THYROIDECTOMY     Social History   Occupational History   Not on file  Tobacco Use   Smoking status: Never   Smokeless tobacco: Current    Types: Snuff  Vaping Use   Vaping Use: Never used  Substance and Sexual Activity   Alcohol use: No   Drug use: No   Sexual activity: Not on file

## 2022-01-29 NOTE — Progress Notes (Signed)
Remote pacemaker transmission.   

## 2022-02-04 ENCOUNTER — Encounter: Payer: Self-pay | Admitting: Sports Medicine

## 2022-02-04 ENCOUNTER — Ambulatory Visit: Payer: Self-pay

## 2022-02-04 ENCOUNTER — Ambulatory Visit: Payer: Medicare PPO | Admitting: Sports Medicine

## 2022-02-04 DIAGNOSIS — M25512 Pain in left shoulder: Secondary | ICD-10-CM

## 2022-02-04 DIAGNOSIS — G8929 Other chronic pain: Secondary | ICD-10-CM | POA: Diagnosis not present

## 2022-02-04 DIAGNOSIS — M19012 Primary osteoarthritis, left shoulder: Secondary | ICD-10-CM

## 2022-02-04 MED ORDER — LIDOCAINE HCL 1 % IJ SOLN
2.0000 mL | INTRAMUSCULAR | Status: AC | PRN
  Administered 2022-02-04: 2 mL

## 2022-02-04 MED ORDER — METHYLPREDNISOLONE ACETATE 40 MG/ML IJ SUSP
40.0000 mg | INTRAMUSCULAR | Status: AC | PRN
  Administered 2022-02-04: 40 mg via INTRA_ARTICULAR

## 2022-02-04 MED ORDER — BUPIVACAINE HCL 0.25 % IJ SOLN
2.0000 mL | INTRAMUSCULAR | Status: AC | PRN
  Administered 2022-02-04: 2 mL via INTRA_ARTICULAR

## 2022-02-04 NOTE — Progress Notes (Signed)
   Procedure Note  Patient: Stephanie Atkinson             Date of Birth: 1924-03-10           MRN: [id number]             Visit Date: 02/04/2022  Procedures: Visit Diagnoses:  1. Chronic left shoulder pain   2. Arthritis of left shoulder region    Large Joint Inj: L glenohumeral on 02/04/2022 2:12 PM Indications: pain Details: 22 G 3.5 in needle, ultrasound-guided posterior approach Medications: 2 mL lidocaine 1 %; 2 mL bupivacaine 0.25 %; 40 mg methylPREDNISolone acetate 40 MG/ML Outcome: tolerated well, no immediate complications  US-guided glenohumeral joint injection, left shoulder After discussion on risks/benefits/indications, informed verbal consent was obtained. A timeout was then performed. The patient was positioned lying lateral recumbent on examination table. The patient's shoulder was prepped with betadine and multiple alcohol swabs and utilizing ultrasound guidance, the patient's glenohumeral joint was identified on ultrasound. Using ultrasound guidance a 22-gauge, 3.5 inch needle with a mixture of 2:2:1 cc's lidocaine:bupivicaine:depomedrol was directed from a lateral to medial direction via in-plane technique into the glenohumeral joint with visualization of appropriate spread of injectate into the joint. Patient tolerated the procedure well without immediate complications.      Procedure, treatment alternatives, risks and benefits explained, specific risks discussed. Consent was given by the patient. Immediately prior to procedure a time out was called to verify the correct patient, procedure, equipment, support staff and site/side marked as required. Patient was prepped and draped in the usual sterile fashion.     - I evaluated the patient about 10 minutes post-injection and she had good improvement in pain and range of motion - follow-up with Zonia Kief and Dr. Ophelia Charter as indicated; I am happy to see them as needed  Madelyn Brunner, DO Primary Care Sports Medicine Physician   [facility]  This note was dictated using Dragon naturally speaking software and may contain errors in syntax, spelling, or content which have not been identified prior to signing this note.

## 2022-02-26 ENCOUNTER — Ambulatory Visit: Payer: Medicare PPO

## 2022-02-26 ENCOUNTER — Emergency Department (HOSPITAL_COMMUNITY): Payer: Medicare PPO

## 2022-02-26 ENCOUNTER — Encounter (HOSPITAL_COMMUNITY): Payer: Self-pay

## 2022-02-26 ENCOUNTER — Emergency Department (HOSPITAL_COMMUNITY)
Admission: EM | Admit: 2022-02-26 | Discharge: 2022-02-26 | Disposition: A | Discharge status: Home / Self Care | Payer: Medicare PPO | Attending: Emergency Medicine | Admitting: Emergency Medicine

## 2022-02-26 DIAGNOSIS — Z7901 Long term (current) use of anticoagulants: Secondary | ICD-10-CM | POA: Diagnosis not present

## 2022-02-26 DIAGNOSIS — M542 Cervicalgia: Secondary | ICD-10-CM | POA: Diagnosis not present

## 2022-02-26 DIAGNOSIS — J189 Pneumonia, unspecified organism: Secondary | ICD-10-CM | POA: Diagnosis not present

## 2022-02-26 DIAGNOSIS — N3000 Acute cystitis without hematuria: Secondary | ICD-10-CM | POA: Insufficient documentation

## 2022-02-26 DIAGNOSIS — M4807 Spinal stenosis, lumbosacral region: Secondary | ICD-10-CM | POA: Diagnosis not present

## 2022-02-26 DIAGNOSIS — R1032 Left lower quadrant pain: Secondary | ICD-10-CM | POA: Diagnosis not present

## 2022-02-26 DIAGNOSIS — Z1152 Encounter for screening for COVID-19: Secondary | ICD-10-CM | POA: Insufficient documentation

## 2022-02-26 DIAGNOSIS — M545 Low back pain, unspecified: Secondary | ICD-10-CM | POA: Diagnosis not present

## 2022-02-26 DIAGNOSIS — R059 Cough, unspecified: Secondary | ICD-10-CM | POA: Diagnosis not present

## 2022-02-26 DIAGNOSIS — M48061 Spinal stenosis, lumbar region without neurogenic claudication: Secondary | ICD-10-CM | POA: Diagnosis not present

## 2022-02-26 DIAGNOSIS — M47812 Spondylosis without myelopathy or radiculopathy, cervical region: Secondary | ICD-10-CM | POA: Diagnosis not present

## 2022-02-26 LAB — BASIC METABOLIC PANEL
Anion gap: 10 (ref 5–15)
BUN: 11 mg/dL (ref 8–23)
CO2: 24 mmol/L (ref 22–32)
Calcium: 10.9 mg/dL — ABNORMAL HIGH (ref 8.9–10.3)
Chloride: 107 mmol/L (ref 98–111)
Creatinine, Ser: 1.12 mg/dL — ABNORMAL HIGH (ref 0.44–1.00)
GFR, Estimated: 45 mL/min — ABNORMAL LOW (ref >60)
Glucose, Bld: 90 mg/dL (ref 70–99)
Potassium: 3.9 mmol/L (ref 3.5–5.1)
Sodium: 141 mmol/L (ref 135–145)

## 2022-02-26 LAB — CBC WITH DIFFERENTIAL/PLATELET
Abs Immature Granulocytes: 0.01 10*3/uL (ref 0.00–0.07)
Basophils Absolute: 0 10*3/uL (ref 0.0–0.1)
Basophils Relative: 0 %
Eosinophils Absolute: 0 10*3/uL (ref 0.0–0.5)
Eosinophils Relative: 1 %
HCT: 45.1 % (ref 36.0–46.0)
Hemoglobin: 14 g/dL (ref 12.0–15.0)
Immature Granulocytes: 0 %
Lymphocytes Relative: 26 %
Lymphs Abs: 1 10*3/uL (ref 0.7–4.0)
MCH: 28 pg (ref 26.0–34.0)
MCHC: 31 g/dL (ref 30.0–36.0)
MCV: 90.2 fL (ref 80.0–100.0)
Monocytes Absolute: 0.3 10*3/uL (ref 0.1–1.0)
Monocytes Relative: 8 %
Neutro Abs: 2.6 10*3/uL (ref 1.7–7.7)
Neutrophils Relative %: 65 %
Platelets: 170 10*3/uL (ref 150–400)
RBC: 5 MIL/uL (ref 3.87–5.11)
RDW: 13.4 % (ref 11.5–15.5)
WBC: 4 10*3/uL (ref 4.0–10.5)
nRBC: 0 % (ref 0.0–0.2)

## 2022-02-26 LAB — URINALYSIS, ROUTINE W REFLEX MICROSCOPIC
Bacteria, UA: NONE SEEN
Bilirubin Urine: NEGATIVE
Glucose, UA: 150 mg/dL — AB
Hgb urine dipstick: NEGATIVE
Ketones, ur: NEGATIVE mg/dL
Nitrite: NEGATIVE
Protein, ur: NEGATIVE mg/dL
Specific Gravity, Urine: 1.006 (ref 1.005–1.030)
pH: 6 (ref 5.0–8.0)

## 2022-02-26 LAB — RESP PANEL BY RT-PCR (RSV, FLU A&B, COVID)  RVPGX2
Influenza A by PCR: NEGATIVE
Influenza B by PCR: NEGATIVE
Resp Syncytial Virus by PCR: NEGATIVE
SARS Coronavirus 2 by RT PCR: NEGATIVE

## 2022-02-26 MED ORDER — LIDOCAINE 5 % EX PTCH: 1.0000 | MEDICATED_PATCH | CUTANEOUS | 0 refills | Status: DC

## 2022-02-26 MED ORDER — SODIUM CHLORIDE 0.9 % IV SOLN
1.0000 g | Freq: Once | INTRAVENOUS | Status: AC
  Administered 2022-02-26: 1 g via INTRAVENOUS
  Filled 2022-02-26: qty 10

## 2022-02-26 MED ORDER — CEFPODOXIME PROXETIL 200 MG PO TABS: 200.0000 mg | ORAL_TABLET | Freq: Every day | ORAL | 0 refills | Status: AC

## 2022-02-26 MED ORDER — AZITHROMYCIN 250 MG PO TABS: 250.0000 mg | ORAL_TABLET | Freq: Every day | ORAL | 0 refills | Status: DC

## 2022-02-26 MED ORDER — TRAMADOL HCL 50 MG PO TABS
50.0000 mg | ORAL_TABLET | Freq: Once | ORAL | Status: AC
  Administered 2022-02-26: 50 mg via ORAL
  Filled 2022-02-26: qty 1

## 2022-02-26 NOTE — ED Provider Notes (Signed)
Care transferred to me.  COVID/flu/RSV testing negative.  Plan was to send home on UTI/pneumonia treatment to cover the potential abnormality seen on workup.  Otherwise patient seems to have improvement in her back since some topical therapy was applied.  Will discharge with Lidoderm and will treat with cefpodoxime and azithromycin as this should cover both.  She has tolerated cephalosporins in the past.  Creatinine clearance is right at 30 today and has been lower in the past so we will treat with once daily dosing as per [employer] recommendations.  Will discharge home with return precautions.  Back pain is probably muscular but family states she also gets back pain with urinary tract infections.   Sherwood Gambler, MD 02/26/22 [phone]

## 2022-02-26 NOTE — ED Notes (Signed)
Patient ambulated to bathroom with assistance of 1

## 2022-02-26 NOTE — Discharge Instructions (Signed)
You are being treated with 2 different antibiotics to cover potential pneumonia and bladder infection.  If you develop fever, vomiting, new or worsening back pain, weakness or numbness in your legs, bladder or bowel incontinence, trouble breathing, or any other new/concerning symptoms then return to the ER for evaluation.

## 2022-02-26 NOTE — ED Triage Notes (Signed)
Patient arrived to the ED via EMS from home with complaints of right flank pain. Pain 9/10. Patient states she had a UTI 1 month ago. Patient states her urine is a yellow/orange color. Patient states she doesn't have difficulty urinating and doesn't burn. Patient is A&Ox4.

## 2022-02-26 NOTE — ED Provider Notes (Signed)
[facility] EMERGENCY DEPARTMENT Provider Note   CSN: [id number] Arrival date & time: 02/26/22  1112     History  Chief Complaint  Patient presents with   Flank Pain    KEIA RASK is a 89 y.o. female.  Patient is a 89 year old female who presents with back pain.  She states for about the last 4 to 5 days she has had some pain in her right lower back and some pain in her neck for about the last week.  She has that hurts when she twists or moves.  There is no radiation down her legs.  No numbness or weakness to her extremities.  She denies any recent falls or trauma.  No injuries to her back.  She denies any associate abdominal pain.  She denies any urinary symptoms.  She has had some cough and congestion and a little bit of fatigue.  No fevers.  No vomiting or diarrhea.  She says she was treated for UTI about a month ago.  She has been taking some tramadol at home for pain.       Home Medications Prior to Admission medications   Medication Sig Start Date End Date Taking? Authorizing Provider  acetaminophen (TYLENOL) 325 MG tablet Take 325 mg by mouth every 6 (six) hours as needed.    [provider]  acetaminophen (TYLENOL) 500 MG tablet Take 1 tablet (500 mg total) by mouth every 6 (six) hours as needed for moderate pain. Patient taking differently: Take 1,000 mg by mouth every 6 (six) hours as needed for moderate pain. 06/27/19   Briant Cedar, MD  apixaban (ELIQUIS) 2.5 MG TABS tablet Take 1 tablet (2.5 mg total) by mouth 2 (two) times daily. 01/01/22   Runell Gess, MD  atorvastatin (LIPITOR) 80 MG tablet Take 80 mg by mouth at bedtime.     [provider]  carvedilol (COREG) 3.125 MG tablet Take 1 tablet (3.125 mg total) by mouth 2 (two) times daily with a meal. 01/08/22   Croitoru, Mihai, MD  cyclobenzaprine (FLEXERIL) 10 MG tablet Take 5-10 mg by mouth daily as needed for muscle spasms. 05/30/16   [provider]   diphenhydrAMINE (BENADRYL) 25 MG tablet Take 25 mg by mouth every 6 (six) hours as needed for allergies.    [provider]  empagliflozin (JARDIANCE) 10 MG TABS tablet Take 1 tablet (10 mg total) by mouth daily before breakfast. 12/17/21   Croitoru, Mihai, MD  furosemide (LASIX) 20 MG tablet Take 1 tablet (20 mg total) by mouth daily. 01/04/22   Croitoru, Mihai, MD  levothyroxine (SYNTHROID, LEVOTHROID) 50 MCG tablet Take 50 mcg by mouth daily before breakfast.     [provider]  lidocaine (LIDODERM) 5 % Place 1 patch onto the skin daily. Remove & Discard patch within 12 hours or as directed by MD 10/01/21   Haskel Schroeder, PA-C  nitroGLYCERIN (NITROSTAT) 0.4 MG SL tablet Place 1 tablet (0.4 mg total) under the tongue every 5 (five) minutes as needed for chest pain. 05/07/21   Runell Gess, MD  Polyethyl Glycol-Propyl Glycol (SYSTANE OP) Place 1 drop into both eyes daily as needed (dry eye).    [provider]  sacubitril-valsartan (ENTRESTO) 24-26 MG Take 1 tablet by mouth 2 (two) times daily. 01/23/22   Runell Gess, MD  traMADol (ULTRAM) 50 MG tablet Take 50 mg by mouth 2 (two) times daily.    [provider]  Allergies    Penicillins and Sulfa antibiotics    Review of Systems   Review of Systems  Constitutional:  Positive for fatigue. Negative for chills, diaphoresis and fever.  HENT:  Positive for congestion. Negative for rhinorrhea and sneezing.   Eyes: Negative.   Respiratory:  Positive for cough. Negative for chest tightness and shortness of breath.   Cardiovascular:  Negative for chest pain and leg swelling.  Gastrointestinal:  Negative for abdominal pain, blood in stool, diarrhea, nausea and vomiting.  Genitourinary:  Negative for difficulty urinating, flank pain, frequency and hematuria.  Musculoskeletal:  Positive for back pain and neck pain. Negative for arthralgias.  Skin:  Negative for rash.  Neurological:  Negative for  dizziness, speech difficulty, weakness, numbness and headaches.    Physical Exam Updated Vital Signs BP 134/61   Pulse (!) 59   Temp (!) 97.4 F (36.3 C) (Oral)   Resp 15   Ht 5\' 3"  (1.6 m)   Wt 63.5 kg   SpO2 98%   BMI 24.80 kg/m  Physical Exam Constitutional:      Appearance: She is well-developed.  HENT:     Head: Normocephalic and atraumatic.  Eyes:     Pupils: Pupils are equal, round, and reactive to light.  Cardiovascular:     Rate and Rhythm: Normal rate and regular rhythm.     Heart sounds: Normal heart sounds.  Pulmonary:     Effort: Pulmonary effort is normal. No respiratory distress.     Breath sounds: Normal breath sounds. No wheezing or rales.  Chest:     Chest wall: No tenderness.  Abdominal:     General: Bowel sounds are normal.     Palpations: Abdomen is soft.     Tenderness: There is no abdominal tenderness. There is no guarding or rebound.  Musculoskeletal:        General: Normal range of motion.     Cervical back: Normal range of motion and neck supple.     Comments: Some tenderness along her cervical spine.  No pain to the thoracic time.  No pain specifically along the lumbosacral spine but there is some tenderness to the right lower lumbar area in the musculature.  Motor 5 out of 5 all extremities, sensation grossly intact to light touch all extremities, trace edema to lower extremities bilaterally with some chronic skin changes  Lymphadenopathy:     Cervical: No cervical adenopathy.  Skin:    General: Skin is warm and dry.     Findings: No rash.  Neurological:     Mental Status: She is alert and oriented to person, place, and time.     ED Results / Procedures / Treatments   Labs (all labs ordered are listed, but only abnormal results are displayed) Labs Reviewed  BASIC METABOLIC PANEL - Abnormal; Notable for the following components:      Result Value   Creatinine, Ser 1.12 (*)    Calcium 10.9 (*)    GFR, Estimated 45 (*)    All other  components within normal limits  URINALYSIS, ROUTINE W REFLEX MICROSCOPIC - Abnormal; Notable for the following components:   Color, Urine STRAW (*)    Glucose, UA 150 (*)    Leukocytes,Ua MODERATE (*)    All other components within normal limits  RESP PANEL BY RT-PCR (RSV, FLU A&B, COVID)  RVPGX2  URINE CULTURE  CBC WITH DIFFERENTIAL/PLATELET    EKG None  Radiology DG Chest 2 View  Result Date: 02/26/2022 CLINICAL  DATA:  Cough EXAM: CHEST - 2 VIEW COMPARISON:  Chest radiograph dated 12/09/2021 FINDINGS: Left chest wall pacemaker lead projects over the right ventricle. Low lung volumes. Left-greater-than-right basilar patchy opacities. Blunting of the left costophrenic angle. No pneumothorax. Similar enlarged postsurgical cardiomediastinal silhouette. Median sternotomy wires are nondisplaced. IMPRESSION: 1. Low lung volumes with left-greater-than-right basilar patchy opacities, which may represent atelectasis, aspiration or infection. 2. Blunting of the left costophrenic angle may represent a trace pleural effusion. Electronically Signed   By: Darrin Nipper M.D.   On: 02/26/2022 12:46   CT Cervical Spine Wo Contrast  Result Date: 02/26/2022 CLINICAL DATA:  Neck pain. EXAM: CT CERVICAL SPINE WITHOUT CONTRAST TECHNIQUE: Multidetector CT imaging of the cervical spine was performed without intravenous contrast. Multiplanar CT image reconstructions were also generated. RADIATION DOSE REDUCTION: This exam was performed according to the departmental dose-optimization program which includes automated exposure control, adjustment of the mA and/or kV according to patient size and/or use of iterative reconstruction technique. COMPARISON:  12/26/2016 FINDINGS: Alignment: Normal overall alignment of the cervical vertebral bodies. Stable multilevel degenerative subluxations are noted. Skull base and vertebrae: No acute fracture or worrisome bone lesion. Stable advanced degenerative changes at C1-2. Soft tissues and  spinal canal: No prevertebral fluid or swelling. No visible canal hematoma. Disc levels: Stable degenerative cervical spondylosis with multilevel disc disease and facet disease. The spinal canal is fairly generous and there is no significant canal stenosis. Stable appearing multilevel bony foraminal stenosis due to uncinate spurring and facet disease. Upper chest: The lung apices are grossly clear. Other: No neck mass, adenopathy or hematoma. IMPRESSION: 1. Stable degenerative cervical spondylosis with multilevel disc disease and facet disease. 2. No acute bony findings. Electronically Signed   By: Marijo Sanes M.D.   On: 02/26/2022 12:38   CT Lumbar Spine Wo Contrast  Result Date: 02/26/2022 CLINICAL DATA:  Low back pain, increased fracture risk EXAM: CT LUMBAR SPINE WITHOUT CONTRAST TECHNIQUE: Multidetector CT imaging of the lumbar spine was performed without intravenous contrast administration. Multiplanar CT image reconstructions were also generated. RADIATION DOSE REDUCTION: This exam was performed according to the departmental dose-optimization program which includes automated exposure control, adjustment of the mA and/or kV according to patient size and/or use of iterative reconstruction technique. COMPARISON:  Lumbar spine radiograph 10/01/2021 FINDINGS: Segmentation: 5 lumbar type vertebrae. Alignment: There is grade 1 anterolisthesis at L4-L5. Vertebrae: There is no evidence of lumbar spine fracture. No aggressive osseous lesion. Paraspinal and other soft tissues: Aortoiliac atherosclerosis. Prominence of the renal collecting systems and proximal ureters bilaterally, with smooth tapering distally. Sigmoid diverticulosis. Disc levels: T12-L1: No significant spinal canal or neural foraminal stenosis. L1-L2: Mild foraminal disc bulging along with facet arthropathy and bony spurring results in moderate left-sided neural foraminal stenosis. No significant spinal canal or right neural foraminal narrowing.  L2-L3: Left paracentral disc protrusion along with ligamentum flavum hypertrophy and facet arthropathy results in moderate-severe left subarticular stenosis and mild left neural foraminal stenosis. No right neural foraminal narrowing. L3-L4: Mild disc bulging with ligamentum flavum hypertrophy and mild facet arthropathy results in mild spinal canal stenosis, moderate left and no significant right neural foraminal stenosis. There is contact with the exiting left L3 nerve root (series 10, image 58). L4-L5: Grade 1 anterolisthesis with broad disc bulging, ligament flavum hypertrophy, and advanced bilateral facet arthropathy. There is moderate-severe spinal canal stenosis, moderate-severe right and moderate left neural foraminal stenosis. L5-S1: Minimal disc bulging with advanced bilateral facet arthropathy. No significant spinal canal stenosis. Moderate  right and mild left neural foraminal narrowing. IMPRESSION: No acute lumbar spine fracture. Multilevel degenerative changes, with varying degrees of spinal canal and neural foraminal stenosis, worst at L4-L5. Grade 1 anterolisthesis at L4-L5 with broad disc bulging, ligament flavum hypertrophy, and advanced bilateral facet arthropathy, resulting in moderate-severe spinal canal stenosis, moderate-severe right and moderate left neural foraminal stenosis. Mild spinal canal stenosis and moderate left neural foraminal stenosis at L3-L4, contacting the exiting left L3 nerve root. Moderate-severe left subarticular stenosis at L2-L3 due to a left paracentral disc protrusion, potentially impinging the descending left L3 nerve root. Mild left neural foraminal narrowing at this level. Moderate right and mild left neural foraminal stenosis at L5-S1. Electronically Signed   By: Maurine Simmering M.D.   On: 02/26/2022 12:37    Procedures Procedures    Medications Ordered in ED Medications  traMADol (ULTRAM) tablet 50 mg (has no administration in time range)  cefTRIAXone (ROCEPHIN)  1 g in sodium chloride 0.9 % 100 mL IVPB (has no administration in time range)    ED Course/ Medical Decision Making/ A&P                           Medical Decision Making Amount and/or Complexity of Data Reviewed Labs: ordered. Radiology: ordered.  Risk Prescription drug management.   Patient is a 89 year old who presents with back pain.  Seems to be musculoskeletal in nature.  She does not have any focal neurologic deficits.  No signs of cauda equina.  CT scan of the cervical and lumbar spine were performed.  No acute fractures.  She does have some degenerative type changes.  Her urinalysis does look consistent with infection.  Her other labs are nonconcerning.  Chest x-ray two-view shows possible lower lobe infiltrates versus atelectasis.  She does have a cough.  COVID/flu test are pending.  Dr. Regenia Skeeter to take over care pending COVID/flu test and reassessment.   Final Clinical Impression(s) / ED Diagnoses Final diagnoses:  Acute cystitis without hematuria  Community acquired pneumonia, unspecified laterality    Rx / DC Orders ED Discharge Orders     None         Malvin Johns, MD 02/26/22 1557

## 2022-02-27 ENCOUNTER — Ambulatory Visit: Payer: Medicare PPO | Attending: Cardiovascular Disease | Admitting: Student

## 2022-02-27 VITALS — BP 136/75 | HR 69

## 2022-02-27 DIAGNOSIS — I5022 Chronic systolic (congestive) heart failure: Secondary | ICD-10-CM | POA: Diagnosis not present

## 2022-02-27 DIAGNOSIS — I5023 Acute on chronic systolic (congestive) heart failure: Secondary | ICD-10-CM | POA: Diagnosis not present

## 2022-02-27 MED ORDER — SACUBITRIL-VALSARTAN 49-51 MG PO TABS: 1.0000 | ORAL_TABLET | Freq: Two times a day (BID) | ORAL | 3 refills | Status: DC

## 2022-02-27 NOTE — Progress Notes (Signed)
Patient ID: RENESSA Atkinson                 DOB: 03-16-1924                      MRN: [id number]     HPI: Stephanie Atkinson is a 89 y.o. female referred by by Dr. Sallyanne Atkinson to pharmacy clinic for HF medication management. PMH is significant for CAD s/p CABG x 4 in 2006, s/p DES-PDA in 2012, paroxysmal atrial fibrillation, syncope s/p ILR in 2014, symptomatic bradycardia s/p permanent transvenous pacemaker in 2019, hypertension, hyperlipidemia, CKD stage III, and hypothyroidism. Most recent LVEF 30-35% on 12/11/2021.  Today patient presented with her daughter. Patient is tolerating Entresto well without any side effects. Patient is be able to perform daily living activities without any SOB still lives independently does all house hold chores and grocery shopping. Denies any dizziness, lightheadedness, and fatigue, chest pain or palpitations. Does not weigh herself every day but takes Lasix 20 mg daily and has not noticed any swelling. Due to arthritis she has trouble checking BP at home so checks only few times in a month it stays around 136/80 range. Will be using wrist cuff instead and will bring in for validations at the  next office visit.    Current CHF meds: Jardiance 10 mg, Entresto 24/26  mg daily and carvedilol 3.125 mg twice daily, furosemide 20 mg daily   Previously tried: valsartan 80 mg - no issue changed it to ARNI. Adherence Assessment  Do you ever forget to take your medication? [] Yes [x] No  Do you ever skip doses due to side effects? [] Yes [x] No  Do you have trouble affording your medicines? [] Yes [x] No  Are you ever unable to pick up your medication due to transportation difficulties? [] Yes [x] No  Do you ever stop taking your medications because you don't believe they are helping? [] Yes [x] No  Do you check your weight daily? [] Yes [x] No   Adherence strategy: Pill box   Barriers to obtaining medications: none  BP goal: <130/80  Diet: generally healthy low salt, low fat    Exercise: active around the house   Home BP readings: ~136/80   Wt Readings from Last 3 Encounters:  02/26/22 140 lb (63.5 kg)  01/24/22 142 lb (64.4 kg)  12/17/21 142 lb 3.2 oz (64.5 kg)   BP Readings from Last 3 Encounters:  02/27/22 136/75  02/26/22 (!) 151/69  01/24/22 128/67   Pulse Readings from Last 3 Encounters:  02/27/22 69  02/26/22 (!) 58  01/24/22 83    Renal function: Estimated Creatinine Clearance: 26.9 mL/min (A) (by C-G formula based on SCr of 1.12 mg/dL (H)).  Past Medical History:  Diagnosis Date   Chronic kidney disease, stage 3 ([facility])    Coronary artery disease    Hx of CABG 2006   LIMA-LAD, free RIMA-PDA, Radial-OM1-OM2   Hyperlipidemia    Hypertension    Hypothyroid    MI, old        PAF (paroxysmal atrial fibrillation) (HCC)    was on amio, d/c'd due to prolonged PR interval, rate control   Presence of permanent cardiac pacemaker 07/16/2017   Presence of stent in right coronary artery 07/12   RIMA-PDA occluded, DES stent placed   Syncope 07/12   bradycardic, beta blocker decreased, also felt to be dehydrated    Current Outpatient Medications on File Prior to Visit  Medication Sig Dispense Refill   acetaminophen (TYLENOL) 325 MG  tablet Take 325 mg by mouth every 6 (six) hours as needed.     acetaminophen (TYLENOL) 500 MG tablet Take 1 tablet (500 mg total) by mouth every 6 (six) hours as needed for moderate pain. (Patient taking differently: Take 1,000 mg by mouth every 6 (six) hours as needed for moderate pain.) 30 tablet 0   apixaban (ELIQUIS) 2.5 MG TABS tablet Take 1 tablet (2.5 mg total) by mouth 2 (two) times daily. 180 tablet 1   atorvastatin (LIPITOR) 80 MG tablet Take 80 mg by mouth at bedtime.      azithromycin (ZITHROMAX) 250 MG tablet Take 1 tablet (250 mg total) by mouth daily. Take first 2 tablets together, then 1 every day until finished. 6 tablet 0   carvedilol (COREG) 3.125 MG tablet Take 1 tablet (3.125 mg total) by mouth 2  (two) times daily with a meal. 90 tablet 3   cefpodoxime (VANTIN) 200 MG tablet Take 1 tablet (200 mg total) by mouth daily for 5 days. 5 tablet 0   cyclobenzaprine (FLEXERIL) 10 MG tablet Take 5-10 mg by mouth daily as needed for muscle spasms.     diphenhydrAMINE (BENADRYL) 25 MG tablet Take 25 mg by mouth every 6 (six) hours as needed for allergies.     empagliflozin (JARDIANCE) 10 MG TABS tablet Take 1 tablet (10 mg total) by mouth daily before breakfast. 30 tablet 11   furosemide (LASIX) 20 MG tablet Take 1 tablet (20 mg total) by mouth daily. 90 tablet 3   levothyroxine (SYNTHROID, LEVOTHROID) 50 MCG tablet Take 50 mcg by mouth daily before breakfast.      lidocaine (LIDODERM) 5 % Place 1 patch onto the skin daily. Remove & Discard patch within 12 hours or as directed by MD 5 patch 0   nitroGLYCERIN (NITROSTAT) 0.4 MG SL tablet Place 1 tablet (0.4 mg total) under the tongue every 5 (five) minutes as needed for chest pain. 25 tablet 6   Polyethyl Glycol-Propyl Glycol (SYSTANE OP) Place 1 drop into both eyes daily as needed (dry eye).     traMADol (ULTRAM) 50 MG tablet Take 50 mg by mouth 2 (two) times daily.     No current facility-administered medications on file prior to visit.    Allergies  Allergen Reactions   Penicillins Hives    Did it involve swelling of the face/tongue/throat, SOB, or low BP? No Did it involve sudden or severe rash/hives, skin peeling, or any reaction on the inside of your mouth or nose? No Did you need to seek medical attention at a hospital or doctor's office? No When did it last happen? Unk    If all above answers are "NO", may proceed with cephalosporin use.    Sulfa Antibiotics Hives and Swelling    CHF (congestive heart failure) (HCC) Assessment:  BP in office 1st reading 149/73 and 2nd reading 136/75 with heart rate 69, home BP ~136/80, last EF 30-35% 12/11/2021 Due to arthritis having hard time using arm cuff,Will bring in wrist cuff for validation  at next OV  Patient takes the medications regularly and tolerates them well without any side effects - Jardiance 10 mg daily, Entresto 24/26 mg daily and carvedilol 3.125 mg twice daily, furosemide 20 mg daily Denies Denies dizziness, SOB,chest pain or swelling. complete all ADLs without any trouble Adheres to a low-salt diet Patient is in agreement to optimize the HF medications- will increase ARNI dose  Renal function has improved significantly; if it stays stable for few months (>  30 ml/min) can consider MRA (spironolactone) low dose    Plan: Increase Entresto dose form 24/26 mg to 49/51 mg twice daily   Continue taking carvedilol 3.125 mg twice daily, Jardiance 10 mg daily and furosemide 20 mg daily Continue checking BP at home and bring log and wrist cuff at the next visit in 4 weeks with PharmD Follow up lab- BMP in 2-3weeks      Thank you   Cammy Copa, Pharm.D [facility] A Division of [facility] [place] [address], [place], [place] [address]  Phone: [phone]; Fax: [phone]

## 2022-02-27 NOTE — Assessment & Plan Note (Addendum)
Assessment:  BP in office 1st reading 149/73 and 2nd reading 136/75 with heart rate 69, home BP ~136/80, last EF 30-35% 12/11/2021 Due to arthritis having hard time using arm cuff,Will bring in wrist cuff for validation at next OV  Patient takes the medications regularly and tolerates them well without any side effects - Jardiance 10 mg daily, Entresto 24/26 mg daily and carvedilol 3.125 mg twice daily, furosemide 20 mg daily Denies Denies dizziness, SOB,chest pain or swelling. complete all ADLs without any trouble Adheres to a low-salt diet Patient is in agreement to optimize the HF medications- will increase ARNI dose  Renal function has improved significantly; if it stays stable for few months (>30 ml/min) can consider MRA (spironolactone) low dose    Plan: Increase Entresto dose form 24/26 mg to 49/51 mg twice daily   Continue taking carvedilol 3.125 mg twice daily, Jardiance 10 mg daily and furosemide 20 mg daily Continue checking BP at home and bring log and wrist cuff at the next visit in 4 weeks with PharmD Follow up lab- BMP in 2-3weeks

## 2022-02-27 NOTE — Patient Instructions (Addendum)
Changes made by your pharmacist Cammy Copa, PharmD at today's visit:    Instructions/Changes  (what do you need to do) Your Notes  (what you did and when you did it)  Increase Entresto 24/26 from 1 tablet twice daily to 2 tablets twice daily  to finish current home supply. Then get new prescription filled at the [facility]   2. Continue taking carvedilol 3.125 mg twice daily, furosemide 20 mg daily and Jardiance 10 mg daily   3. Bring home wrist BP cuff for validation at the next office visit     Bring all of your meds, your BP cuff and your record of home blood pressures to your next appointment.    HOW TO TAKE YOUR BLOOD PRESSURE AT HOME  Rest 5 minutes before taking your blood pressure.  Don't smoke or drink caffeinated beverages for at least 30 minutes before. Take your blood pressure before (not after) you eat. Sit comfortably with your back supported and both feet on the floor (don't cross your legs). Elevate your arm to heart level on a table or a desk. Use the proper sized cuff. It should fit smoothly and snugly around your bare upper arm. There should be enough room to slip a fingertip under the cuff. The bottom edge of the cuff should be 1 inch above the crease of the elbow. Ideally, take 3 measurements at one sitting and record the average.  Important lifestyle changes to control high blood pressure  Intervention  Effect on the BP  Lose extra pounds and watch your waistline Weight loss is one of the most effective lifestyle changes for controlling blood pressure. If you're overweight or obese, losing even a small amount of weight can help reduce blood pressure. Blood pressure might go down by about 1 millimeter of mercury (mm Hg) with each kilogram (about 2.2 pounds) of weight lost.  Exercise regularly As a general goal, aim for at least 30 minutes of moderate physical activity every day. Regular physical activity can lower high blood pressure by about 5 to 8 mm Hg.  Eat a  healthy diet Eating a diet rich in whole grains, fruits, vegetables, and low-fat dairy products and low in saturated fat and cholesterol. A healthy diet can lower high blood pressure by up to 11 mm Hg.  Reduce salt (sodium) in your diet Even a small reduction of sodium in the diet can improve heart health and reduce high blood pressure by about 5 to 6 mm Hg.  Limit alcohol One drink equals 12 ounces of beer, 5 ounces of wine, or 1.5 ounces of 80-proof liquor.  Limiting alcohol to less than one drink a day for women or two drinks a day for men can help lower blood pressure by about 4 mm Hg.   If you have any questions or concerns please use My Chart to send questions or call the office at [phone]

## 2022-02-28 ENCOUNTER — Telehealth: Payer: Self-pay

## 2022-02-28 LAB — BASIC METABOLIC PANEL
BUN/Creatinine Ratio: 12 (ref 12–28)
BUN: 14 mg/dL (ref 10–36)
CO2: 22 mmol/L (ref 20–29)
Calcium: 10.7 mg/dL — ABNORMAL HIGH (ref 8.7–10.3)
Chloride: 105 mmol/L (ref 96–106)
Creatinine, Ser: 1.21 mg/dL — ABNORMAL HIGH (ref 0.57–1.00)
Glucose: 88 mg/dL (ref 70–99)
Potassium: 4.2 mmol/L (ref 3.5–5.2)
Sodium: 143 mmol/L (ref 134–144)
eGFR: 41 mL/min/{1.73_m2} — ABNORMAL LOW (ref >59)

## 2022-02-28 LAB — URINE CULTURE

## 2022-02-28 NOTE — Telephone Encounter (Signed)
        Patient  visited [place] on 1/2    Telephone encounter attempt :  1ST  A HIPAA compliant voice message was left requesting a return call.  Instructed patient to call back     [facility] [place] [facility]  [phone] [address], [place], [place] [address] Phone: [phone] Email: [email or web address]

## 2022-03-01 ENCOUNTER — Telehealth: Payer: Self-pay

## 2022-03-01 NOTE — Telephone Encounter (Signed)
     Patient  visit on 1/2  at [facility] Have you been able to follow up with your primary care physician? Yes   The patient was or was not able to obtain any needed medicine or equipment. Yes   Are there diet recommendations that you are having difficulty following? Na   Patient expresses understanding of discharge instructions and education provided has no other needs at this time.  Yes      Willy Vorce [facility], [facility], Care Management  [phone] [address], [place],  [address] Phone: [phone] Email: [email or web address]    

## 2022-03-04 ENCOUNTER — Ambulatory Visit: Payer: Medicare PPO | Admitting: Cardiovascular Disease

## 2022-03-11 ENCOUNTER — Emergency Department (HOSPITAL_BASED_OUTPATIENT_CLINIC_OR_DEPARTMENT_OTHER)
Admission: EM | Admit: 2022-03-11 | Discharge: 2022-03-12 | Disposition: A | Discharge status: Home / Self Care | Payer: Medicare PPO | Source: Home / Self Care | Attending: Emergency Medicine | Admitting: Emergency Medicine

## 2022-03-11 ENCOUNTER — Encounter (HOSPITAL_BASED_OUTPATIENT_CLINIC_OR_DEPARTMENT_OTHER): Payer: Self-pay | Admitting: Emergency Medicine

## 2022-03-11 ENCOUNTER — Emergency Department (HOSPITAL_COMMUNITY)
Admission: EM | Admit: 2022-03-11 | Discharge: 2022-03-11 | Discharge status: Against Medical Advice | Payer: Medicare PPO | Attending: Emergency Medicine | Admitting: Emergency Medicine

## 2022-03-11 ENCOUNTER — Other Ambulatory Visit: Payer: Self-pay

## 2022-03-11 DIAGNOSIS — Z7901 Long term (current) use of anticoagulants: Secondary | ICD-10-CM | POA: Diagnosis not present

## 2022-03-11 DIAGNOSIS — M545 Low back pain, unspecified: Secondary | ICD-10-CM

## 2022-03-11 DIAGNOSIS — R300 Dysuria: Secondary | ICD-10-CM | POA: Insufficient documentation

## 2022-03-11 DIAGNOSIS — Z79899 Other long term (current) drug therapy: Secondary | ICD-10-CM | POA: Diagnosis not present

## 2022-03-11 DIAGNOSIS — I10 Essential (primary) hypertension: Secondary | ICD-10-CM | POA: Diagnosis not present

## 2022-03-11 DIAGNOSIS — R109 Unspecified abdominal pain: Secondary | ICD-10-CM | POA: Insufficient documentation

## 2022-03-11 DIAGNOSIS — K5900 Constipation, unspecified: Secondary | ICD-10-CM | POA: Diagnosis not present

## 2022-03-11 DIAGNOSIS — M549 Dorsalgia, unspecified: Secondary | ICD-10-CM | POA: Diagnosis not present

## 2022-03-11 DIAGNOSIS — K5909 Other constipation: Secondary | ICD-10-CM

## 2022-03-11 DIAGNOSIS — Z5321 Procedure and treatment not carried out due to patient leaving prior to being seen by health care provider: Secondary | ICD-10-CM | POA: Insufficient documentation

## 2022-03-11 DIAGNOSIS — R944 Abnormal results of kidney function studies: Secondary | ICD-10-CM | POA: Diagnosis not present

## 2022-03-11 NOTE — ED Triage Notes (Signed)
C/o rightside flank pain, burning when she pees and frequency. Started 2 days ago. Recent dx UTI finished abt about 10 days ago, was symptom free at that time

## 2022-03-11 NOTE — ED Notes (Addendum)
Patient's family member stated that they were leaving with the patient.

## 2022-03-11 NOTE — ED Triage Notes (Signed)
Pt c/o painful urination and right sided flank pain. Hx of uti 3 weeks ago - finished full course of antibiotics. VSS w/ems.   BP 144/82, HR 84, RR 22, CBG 153

## 2022-03-12 ENCOUNTER — Encounter (HOSPITAL_BASED_OUTPATIENT_CLINIC_OR_DEPARTMENT_OTHER): Payer: Self-pay | Admitting: Emergency Medicine

## 2022-03-12 ENCOUNTER — Emergency Department (HOSPITAL_BASED_OUTPATIENT_CLINIC_OR_DEPARTMENT_OTHER): Payer: Medicare PPO

## 2022-03-12 DIAGNOSIS — N281 Cyst of kidney, acquired: Secondary | ICD-10-CM | POA: Diagnosis not present

## 2022-03-12 DIAGNOSIS — K5730 Diverticulosis of large intestine without perforation or abscess without bleeding: Secondary | ICD-10-CM | POA: Diagnosis not present

## 2022-03-12 LAB — CBC WITH DIFFERENTIAL/PLATELET
Abs Immature Granulocytes: 0.01 10*3/uL (ref 0.00–0.07)
Basophils Absolute: 0 10*3/uL (ref 0.0–0.1)
Basophils Relative: 0 %
Eosinophils Absolute: 0 10*3/uL (ref 0.0–0.5)
Eosinophils Relative: 1 %
HCT: 43.2 % (ref 36.0–46.0)
Hemoglobin: 13.5 g/dL (ref 12.0–15.0)
Immature Granulocytes: 0 %
Lymphocytes Relative: 30 %
Lymphs Abs: 1.2 10*3/uL (ref 0.7–4.0)
MCH: 27.1 pg (ref 26.0–34.0)
MCHC: 31.3 g/dL (ref 30.0–36.0)
MCV: 86.7 fL (ref 80.0–100.0)
Monocytes Absolute: 0.6 10*3/uL (ref 0.1–1.0)
Monocytes Relative: 14 %
Neutro Abs: 2.2 10*3/uL (ref 1.7–7.7)
Neutrophils Relative %: 55 %
Platelets: 194 10*3/uL (ref 150–400)
RBC: 4.98 MIL/uL (ref 3.87–5.11)
RDW: 13.4 % (ref 11.5–15.5)
WBC: 4 10*3/uL (ref 4.0–10.5)
nRBC: 0 % (ref 0.0–0.2)

## 2022-03-12 LAB — BASIC METABOLIC PANEL
Anion gap: 6 (ref 5–15)
BUN: 16 mg/dL (ref 8–23)
CO2: 28 mmol/L (ref 22–32)
Calcium: 11.1 mg/dL — ABNORMAL HIGH (ref 8.9–10.3)
Chloride: 107 mmol/L (ref 98–111)
Creatinine, Ser: 1.15 mg/dL — ABNORMAL HIGH (ref 0.44–1.00)
GFR, Estimated: 44 mL/min — ABNORMAL LOW (ref >60)
Glucose, Bld: 86 mg/dL (ref 70–99)
Potassium: 3.8 mmol/L (ref 3.5–5.1)
Sodium: 141 mmol/L (ref 135–145)

## 2022-03-12 LAB — URINALYSIS, ROUTINE W REFLEX MICROSCOPIC
Bacteria, UA: NONE SEEN
Bilirubin Urine: NEGATIVE
Glucose, UA: >1000 mg/dL — AB
Hgb urine dipstick: NEGATIVE
Ketones, ur: NEGATIVE mg/dL
Nitrite: NEGATIVE
Protein, ur: NEGATIVE mg/dL
Specific Gravity, Urine: 1.009 (ref 1.005–1.030)
pH: 6 (ref 5.0–8.0)

## 2022-03-12 MED ORDER — LIDOCAINE 5 % EX PTCH
1.0000 | MEDICATED_PATCH | CUTANEOUS | Status: DC
  Administered 2022-03-12: 1 via TRANSDERMAL
  Filled 2022-03-12: qty 1

## 2022-03-12 MED ORDER — LIDOCAINE 5 % EX PTCH: 1.0000 | MEDICATED_PATCH | CUTANEOUS | 0 refills | Status: DC

## 2022-03-12 MED ORDER — DICLOFENAC SODIUM 1 % EX GEL: 4.0000 g | Freq: Four times a day (QID) | CUTANEOUS | 0 refills | Status: DC

## 2022-03-12 MED ORDER — ACETAMINOPHEN 500 MG PO TABS
1000.0000 mg | ORAL_TABLET | Freq: Once | ORAL | Status: AC
  Administered 2022-03-12: 1000 mg via ORAL
  Filled 2022-03-12: qty 2

## 2022-03-12 NOTE — Discharge Instructions (Addendum)
It was our pleasure to care for you today  Miralax one capful twice daily for 4 days then once daily thereafter.    Use A &D barrier ointment to prevent skin breakdown.

## 2022-03-12 NOTE — ED Provider Notes (Signed)
[facility] DEPT Provider Note   CSN: [id number] Arrival date & time: 03/11/22  2341     History  Chief Complaint  Patient presents with   Dysuria    Stephanie Atkinson is a 89 y.o. female.  The history is provided by the patient.  Flank Pain This is a new problem. The current episode started 2 days ago. The problem occurs constantly. The problem has not changed since onset.Pertinent negatives include no abdominal pain. Nothing aggravates the symptoms. Nothing relieves the symptoms. Treatments tried: home ultram. The treatment provided no relief.       Home Medications Prior to Admission medications   Medication Sig Start Date End Date Taking? Authorizing Provider  acetaminophen (TYLENOL) 325 MG tablet Take 325 mg by mouth every 6 (six) hours as needed.    [provider]  acetaminophen (TYLENOL) 500 MG tablet Take 1 tablet (500 mg total) by mouth every 6 (six) hours as needed for moderate pain. Patient taking differently: Take 1,000 mg by mouth every 6 (six) hours as needed for moderate pain. 06/27/19   Alma Friendly, MD  apixaban (ELIQUIS) 2.5 MG TABS tablet Take 1 tablet (2.5 mg total) by mouth 2 (two) times daily. 01/01/22   Lorretta Harp, MD  atorvastatin (LIPITOR) 80 MG tablet Take 80 mg by mouth at bedtime.     [provider]  azithromycin (ZITHROMAX) 250 MG tablet Take 1 tablet (250 mg total) by mouth daily. Take first 2 tablets together, then 1 every day until finished. 02/26/22   Sherwood Gambler, MD  carvedilol (COREG) 3.125 MG tablet Take 1 tablet (3.125 mg total) by mouth 2 (two) times daily with a meal. 01/08/22   Croitoru, Mihai, MD  cyclobenzaprine (FLEXERIL) 10 MG tablet Take 5-10 mg by mouth daily as needed for muscle spasms. 05/30/16   [provider]  diphenhydrAMINE (BENADRYL) 25 MG tablet Take 25 mg by mouth every 6 (six) hours as needed for allergies.    [provider]  empagliflozin (JARDIANCE) 10  MG TABS tablet Take 1 tablet (10 mg total) by mouth daily before breakfast. 12/17/21   Croitoru, Mihai, MD  furosemide (LASIX) 20 MG tablet Take 1 tablet (20 mg total) by mouth daily. 01/04/22   Croitoru, Mihai, MD  levothyroxine (SYNTHROID, LEVOTHROID) 50 MCG tablet Take 50 mcg by mouth daily before breakfast.     [provider]  lidocaine (LIDODERM) 5 % Place 1 patch onto the skin daily. Remove & Discard patch within 12 hours or as directed by MD 02/26/22   Sherwood Gambler, MD  nitroGLYCERIN (NITROSTAT) 0.4 MG SL tablet Place 1 tablet (0.4 mg total) under the tongue every 5 (five) minutes as needed for chest pain. 05/07/21   Lorretta Harp, MD  Polyethyl Glycol-Propyl Glycol (SYSTANE OP) Place 1 drop into both eyes daily as needed (dry eye).    [provider]  sacubitril-valsartan (ENTRESTO) 49-51 MG Take 1 tablet by mouth 2 (two) times daily. 02/27/22   Croitoru, Mihai, MD  traMADol (ULTRAM) 50 MG tablet Take 50 mg by mouth 2 (two) times daily.    [provider]      Allergies    Penicillins and Sulfa antibiotics    Review of Systems   Review of Systems  Constitutional:  Negative for fever.  Gastrointestinal:  Negative for abdominal pain.  Genitourinary:  Positive for flank pain and frequency.  All other systems reviewed and are negative.   Physical Exam Updated Vital Signs  BP (!) 169/64   Pulse 68   Temp 98.5 F (36.9 C)   Resp 18   SpO2 100%  Physical Exam Vitals and nursing note reviewed. Exam conducted with a chaperone present.  Constitutional:      General: She is not in acute distress.    Appearance: She is well-developed.  HENT:     Head: Normocephalic and atraumatic.     Nose: Nose normal.  Eyes:     Pupils: Pupils are equal, round, and reactive to light.  Cardiovascular:     Rate and Rhythm: Normal rate and regular rhythm.     Pulses: Normal pulses.     Heart sounds: Normal heart sounds.  Pulmonary:     Effort: Pulmonary effort is  normal. No respiratory distress.     Breath sounds: Normal breath sounds.  Abdominal:     General: Bowel sounds are normal. There is no distension.     Palpations: Abdomen is soft.     Tenderness: There is no abdominal tenderness. There is no guarding or rebound.  Genitourinary:    General: Normal vulva.     Vagina: No vaginal discharge.     Comments: No signs of yeast infection, no rash no swelling no tenderness  Musculoskeletal:        General: Normal range of motion.     Cervical back: Normal range of motion and neck supple.  Skin:    General: Skin is warm and dry.     Capillary Refill: Capillary refill takes less than 2 seconds.     Findings: No erythema or rash.  Neurological:     General: No focal deficit present.     Mental Status: She is oriented to person, place, and time.     Deep Tendon Reflexes: Reflexes normal.  Psychiatric:        Mood and Affect: Mood normal.     ED Results / Procedures / Treatments   Labs (all labs ordered are listed, but only abnormal results are displayed) Results for orders placed or performed during the hospital encounter of 03/11/22  Urinalysis, Routine w reflex microscopic Urine, Clean Catch  Result Value Ref Range   Color, Urine YELLOW YELLOW   APPearance CLEAR CLEAR   Specific Gravity, Urine 1.009 1.005 - 1.030   pH 6.0 5.0 - 8.0   Glucose, UA >1,000 (A) NEGATIVE mg/dL   Hgb urine dipstick NEGATIVE NEGATIVE   Bilirubin Urine NEGATIVE NEGATIVE   Ketones, ur NEGATIVE NEGATIVE mg/dL   Protein, ur NEGATIVE NEGATIVE mg/dL   Nitrite NEGATIVE NEGATIVE   Leukocytes,Ua TRACE (A) NEGATIVE   RBC / HPF 0-5 0 - 5 RBC/hpf   WBC, UA 0-5 0 - 5 WBC/hpf   Bacteria, UA NONE SEEN NONE SEEN   Squamous Epithelial / HPF 0-5 0 - 5 /HPF  CBC with Differential  Result Value Ref Range   WBC 4.0 4.0 - 10.5 K/uL   RBC 4.98 3.87 - 5.11 MIL/uL   Hemoglobin 13.5 12.0 - 15.0 g/dL   HCT 69.6 29.5 - 28.4 %   MCV 86.7 80.0 - 100.0 fL   MCH 27.1 26.0 - 34.0  pg   MCHC 31.3 30.0 - 36.0 g/dL   RDW 13.2 44.0 - 10.2 %   Platelets 194 150 - 400 K/uL   nRBC 0.0 0.0 - 0.2 %   Neutrophils Relative % 55 %   Neutro Abs 2.2 1.7 - 7.7 K/uL   Lymphocytes Relative 30 %   Lymphs Abs 1.2  0.7 - 4.0 K/uL   Monocytes Relative 14 %   Monocytes Absolute 0.6 0.1 - 1.0 K/uL   Eosinophils Relative 1 %   Eosinophils Absolute 0.0 0.0 - 0.5 K/uL   Basophils Relative 0 %   Basophils Absolute 0.0 0.0 - 0.1 K/uL   Immature Granulocytes 0 %   Abs Immature Granulocytes 0.01 0.00 - 0.07 K/uL  Basic metabolic panel  Result Value Ref Range   Sodium 141 135 - 145 mmol/L   Potassium 3.8 3.5 - 5.1 mmol/L   Chloride 107 98 - 111 mmol/L   CO2 28 22 - 32 mmol/L   Glucose, Bld 86 70 - 99 mg/dL   BUN 16 8 - 23 mg/dL   Creatinine, Ser 5.95 (H) 0.44 - 1.00 mg/dL   Calcium 63.8 (H) 8.9 - 10.3 mg/dL   GFR, Estimated 44 (L) >60 mL/min   Anion gap 6 5 - 15   CT Renal Stone Study  Result Date: 03/12/2022 CLINICAL DATA:  Right side flank pain. Burning and increased urinary frequency. Recent UTI. EXAM: CT ABDOMEN AND PELVIS WITHOUT CONTRAST TECHNIQUE: Multidetector CT imaging of the abdomen and pelvis was performed following the standard protocol without IV contrast. RADIATION DOSE REDUCTION: This exam was performed according to the departmental dose-optimization program which includes automated exposure control, adjustment of the mA and/or kV according to patient size and/or use of iterative reconstruction technique. COMPARISON:  12/01/2020. FINDINGS: Lower chest: Heart is enlarged and pacemaker leads are noted in the heart. No acute abnormality. Hepatobiliary: No focal liver abnormality is seen. Status post cholecystectomy. No biliary dilatation. Pancreas: Unremarkable. No pancreatic ductal dilatation or surrounding inflammatory changes. Spleen: Normal in size without focal abnormality. Adrenals/Urinary Tract: No adrenal nodule or mass. Renal cysts are noted bilaterally. No renal  calculus or hydronephrosis. The bladder is not well seen due to hardware artifact. Stomach/Bowel: Stomach is within normal limits. Appendix appears normal. No evidence of bowel wall thickening, distention, or inflammatory changes. No free air or pneumatosis. Scattered diverticula are present along the colon without evidence of diverticulitis. A moderate amount of stool is noted in the right colon, possible constipation. Vascular/Lymphatic: Aortic atherosclerosis. No enlarged abdominal or pelvic lymph nodes. Reproductive: Uterus and bilateral adnexa are unremarkable. Other: No abdominopelvic ascites. Musculoskeletal: Right hip arthroplasty changes are noted. Surgical clips are present in the inguinal region on the left. There are degenerative changes in the thoracolumbar spine. No acute osseous abnormality. IMPRESSION: 1. Bilateral renal cysts. No renal calculus or obstructive uropathy bilaterally. 2. Moderate amount of retained stool in the colon, possible constipation. 3. Diverticulosis without diverticulitis. 4. Aortic atherosclerosis. Electronically Signed   By: Thornell Sartorius M.D.   On: 03/12/2022 01:00   DG Chest 2 View  Result Date: 02/26/2022 CLINICAL DATA:  Cough EXAM: CHEST - 2 VIEW COMPARISON:  Chest radiograph dated 12/09/2021 FINDINGS: Left chest wall pacemaker lead projects over the right ventricle. Low lung volumes. Left-greater-than-right basilar patchy opacities. Blunting of the left costophrenic angle. No pneumothorax. Similar enlarged postsurgical cardiomediastinal silhouette. Median sternotomy wires are nondisplaced. IMPRESSION: 1. Low lung volumes with left-greater-than-right basilar patchy opacities, which may represent atelectasis, aspiration or infection. 2. Blunting of the left costophrenic angle may represent a trace pleural effusion. Electronically Signed   By: Agustin Cree M.D.   On: 02/26/2022 12:46   CT Cervical Spine Wo Contrast  Result Date: 02/26/2022 CLINICAL DATA:  Neck pain.  EXAM: CT CERVICAL SPINE WITHOUT CONTRAST TECHNIQUE: Multidetector CT imaging of the cervical spine was performed  without intravenous contrast. Multiplanar CT image reconstructions were also generated. RADIATION DOSE REDUCTION: This exam was performed according to the departmental dose-optimization program which includes automated exposure control, adjustment of the mA and/or kV according to patient size and/or use of iterative reconstruction technique. COMPARISON:  12/26/2016 FINDINGS: Alignment: Normal overall alignment of the cervical vertebral bodies. Stable multilevel degenerative subluxations are noted. Skull base and vertebrae: No acute fracture or worrisome bone lesion. Stable advanced degenerative changes at C1-2. Soft tissues and spinal canal: No prevertebral fluid or swelling. No visible canal hematoma. Disc levels: Stable degenerative cervical spondylosis with multilevel disc disease and facet disease. The spinal canal is fairly generous and there is no significant canal stenosis. Stable appearing multilevel bony foraminal stenosis due to uncinate spurring and facet disease. Upper chest: The lung apices are grossly clear. Other: No neck mass, adenopathy or hematoma. IMPRESSION: 1. Stable degenerative cervical spondylosis with multilevel disc disease and facet disease. 2. No acute bony findings. Electronically Signed   By: Marijo Sanes M.D.   On: 02/26/2022 12:38   CT Lumbar Spine Wo Contrast  Result Date: 02/26/2022 CLINICAL DATA:  Low back pain, increased fracture risk EXAM: CT LUMBAR SPINE WITHOUT CONTRAST TECHNIQUE: Multidetector CT imaging of the lumbar spine was performed without intravenous contrast administration. Multiplanar CT image reconstructions were also generated. RADIATION DOSE REDUCTION: This exam was performed according to the departmental dose-optimization program which includes automated exposure control, adjustment of the mA and/or kV according to patient size and/or use of  iterative reconstruction technique. COMPARISON:  Lumbar spine radiograph 10/01/2021 FINDINGS: Segmentation: 5 lumbar type vertebrae. Alignment: There is grade 1 anterolisthesis at L4-L5. Vertebrae: There is no evidence of lumbar spine fracture. No aggressive osseous lesion. Paraspinal and other soft tissues: Aortoiliac atherosclerosis. Prominence of the renal collecting systems and proximal ureters bilaterally, with smooth tapering distally. Sigmoid diverticulosis. Disc levels: T12-L1: No significant spinal canal or neural foraminal stenosis. L1-L2: Mild foraminal disc bulging along with facet arthropathy and bony spurring results in moderate left-sided neural foraminal stenosis. No significant spinal canal or right neural foraminal narrowing. L2-L3: Left paracentral disc protrusion along with ligamentum flavum hypertrophy and facet arthropathy results in moderate-severe left subarticular stenosis and mild left neural foraminal stenosis. No right neural foraminal narrowing. L3-L4: Mild disc bulging with ligamentum flavum hypertrophy and mild facet arthropathy results in mild spinal canal stenosis, moderate left and no significant right neural foraminal stenosis. There is contact with the exiting left L3 nerve root (series 10, image 58). L4-L5: Grade 1 anterolisthesis with broad disc bulging, ligament flavum hypertrophy, and advanced bilateral facet arthropathy. There is moderate-severe spinal canal stenosis, moderate-severe right and moderate left neural foraminal stenosis. L5-S1: Minimal disc bulging with advanced bilateral facet arthropathy. No significant spinal canal stenosis. Moderate right and mild left neural foraminal narrowing. IMPRESSION: No acute lumbar spine fracture. Multilevel degenerative changes, with varying degrees of spinal canal and neural foraminal stenosis, worst at L4-L5. Grade 1 anterolisthesis at L4-L5 with broad disc bulging, ligament flavum hypertrophy, and advanced bilateral facet  arthropathy, resulting in moderate-severe spinal canal stenosis, moderate-severe right and moderate left neural foraminal stenosis. Mild spinal canal stenosis and moderate left neural foraminal stenosis at L3-L4, contacting the exiting left L3 nerve root. Moderate-severe left subarticular stenosis at L2-L3 due to a left paracentral disc protrusion, potentially impinging the descending left L3 nerve root. Mild left neural foraminal narrowing at this level. Moderate right and mild left neural foraminal stenosis at L5-S1. Electronically Signed   By: Maurine Simmering  M.D.   On: 02/26/2022 12:37     Radiology No results found.  Procedures Procedures    Medications Ordered in ED Medications  lidocaine (LIDODERM) 5 % 1 patch (has no administration in time range)  acetaminophen (TYLENOL) tablet 1,000 mg (1,000 mg Oral Given 03/12/22 0012)    ED Course/ Medical Decision Making/ A&P                             Medical Decision Making Patient with Right low back pain and sone dysuria   Amount and/or Complexity of Data Reviewed Independent Historian:     Details: Patient's daughter see above  External Data Reviewed: labs and notes.    Details: Previous notes and labs reviewed  Labs: ordered.    Details: All labs reviewed: glucose in urine but no UTI.  Normal white count 4, normal hemoglobin 13.5, normal platelet count.  Normal sodium 141, normal potassium 3.8, slight elevation of creatinine 1.15 but at patient's baseline  Radiology: ordered and independent interpretation performed.    Details: No renal stones on CT, constipation by me   Risk OTC drugs. Prescription drug management. Risk Details: Patient is well appearing and Ao3.  No signs of sepsis.  Pain in back is very likely mechanical in nature but there are no fractures seen on CT scan. Will add lidoderm and voltaren gel to patient's pain regimen for home. No signs of UTI.  Patient does have constipation.  I have recommended miralax BID x 4  das then daily thereafter.  Patient wears a depends and I wonder if there is not some chaffing of the pad making the patient uncomfortable.  I have recommended AD barrier ointment for the pelvic area.  Patient is stable for idscharge.  Strict return .  All questions answered to patient's satsfaction.      Final Clinical Impression(s) / ED Diagnoses Final diagnoses:  None   Return for intractable cough, coughing up blood, fevers > 100.4 unrelieved by medication, shortness of breath, intractable vomiting, chest pain, shortness of breath, weakness, numbness, changes in speech, facial asymmetry, abdominal pain, passing out, Inability to tolerate liquids or food, cough, altered mental status or any concerns. No signs of systemic illness or infection. The patient is nontoxic-appearing on exam and vital signs are within normal limits.  I have reviewed the triage vital signs and the nursing notes. Pertinent labs & imaging results that were available during my care of the patient were reviewed by me and considered in my medical decision making (see chart for details). After history, exam, and medical workup I feel the patient has been appropriately medically screened and is safe for discharge home. Pertinent diagnoses were discussed with the patient. Patient was given return precautions.  Rx / DC Orders ED Discharge Orders     None         Huan Pollok, MD 03/12/22 0130

## 2022-03-14 LAB — URINE CULTURE
Culture: 20000 — AB
Special Requests: NORMAL

## 2022-03-15 ENCOUNTER — Telehealth (HOSPITAL_BASED_OUTPATIENT_CLINIC_OR_DEPARTMENT_OTHER): Payer: Self-pay

## 2022-03-15 NOTE — Telephone Encounter (Signed)
Post ED Visit - Positive Culture Follow-up  Culture report reviewed by antimicrobial stewardship pharmacist: [facility] Team [x]  Erskine Speed, Pharm.D. []  Heide Guile, Pharm.D., BCPS AQ-ID []  Parks Neptune, Pharm.D., BCPS []  Alycia Rossetti, Pharm.D., BCPS []  [place], Pharm.D., BCPS, AAHIVP []  Legrand Como, Pharm.D., BCPS, AAHIVP []  Salome Arnt, PharmD, BCPS []  Johnnette Gourd, PharmD, BCPS []  Hughes Better, PharmD, BCPS []  Leeroy Cha, PharmD []  Laqueta Linden, PharmD, BCPS []  Albertina Parr, PharmD  [facility] Team []  Leodis Sias, PharmD []  Lindell Spar, PharmD []  Royetta Asal, PharmD []  Graylin Shiver, Rph []  Rema Fendt) Glennon Mac, PharmD []  Arlyn Dunning, PharmD []  Netta Cedars, PharmD []  Dia Sitter, PharmD []  Leone Haven, PharmD []  Gretta Arab, PharmD []  Theodis Shove, PharmD []  Peggyann Juba, PharmD []  Reuel Boom, PharmD   Positive urine culture Not treated, no signs UTI, neg. UA no treatment necessary and no further patient follow-up is required at this time.  Glennon Hamilton 03/15/2022, 9:30 AM

## 2022-03-25 ENCOUNTER — Other Ambulatory Visit: Payer: Self-pay

## 2022-03-25 DIAGNOSIS — I10 Essential (primary) hypertension: Secondary | ICD-10-CM

## 2022-03-26 LAB — BASIC METABOLIC PANEL
BUN/Creatinine Ratio: 13 (ref 12–28)
BUN: 16 mg/dL (ref 10–36)
CO2: 22 mmol/L (ref 20–29)
Calcium: 11.2 mg/dL — ABNORMAL HIGH (ref 8.7–10.3)
Chloride: 104 mmol/L (ref 96–106)
Creatinine, Ser: 1.23 mg/dL — ABNORMAL HIGH (ref 0.57–1.00)
Glucose: 88 mg/dL (ref 70–99)
Potassium: 4.8 mmol/L (ref 3.5–5.2)
Sodium: 142 mmol/L (ref 134–144)
eGFR: 40 mL/min/{1.73_m2} — ABNORMAL LOW (ref >59)

## 2022-03-31 NOTE — Progress Notes (Unsigned)
Office Visit    Patient Name: Stephanie Atkinson Date of Encounter: 04/03/2022  Primary Care Provider:  Deon Pilling, NP Primary Cardiologist:  Quay Burow, MD  Chief Complaint    Heart Failure Medication Titration - EF 30-35% (by echo 11/2021)  Significant Past Medical History   CAD 2006 CABG x 4, 2012 DES- RCA  AFib Paroxysmal, CHADS3-VASc = 6 on Eliquis 2.5  bradycardia Symptomatic, pacemaker in 2019  CKD Stage 3 1/24 GFR 40  hypothyroidism Stable TSH on levothyroxine 50 mcg    Allergies  Allergen Reactions   Penicillins Hives    Did it involve swelling of the face/tongue/throat, SOB, or low BP? No Did it involve sudden or severe rash/hives, skin peeling, or any reaction on the inside of your mouth or nose? No Did you need to seek medical attention at a hospital or doctor's office? No When did it last happen? Unk    If all above answers are "NO", may proceed with cephalosporin use.    Sulfa Antibiotics Hives and Swelling    History of Present Illness    Stephanie Atkinson is a 89 y.o. female patient of Dr Sallyanne Kuster, in the office today for management of HF medications.  She was most recently seen on Jan 3 by Cammy Copa PharmD.  At that time she reported feeling well and living independently.  She is able to do household chores and grocery shopping, only limited by arthritis.  At that visit her Delene Loll was increased to 49/51 mg twice daily and she was asked to continue monitoring home BP readings.  Labs drawn later in the month showed a slight bump in SCr (< 10%) and GFR at 40.  Today she returns for follow up.  She is feeling well overall, still active and states no concerns since increasing the Entresto to 49/51 mg twice daily.  Her daughter is with her in the office today.    Blood Pressure Goal:  130/80  GDMT: ACEI/ARB/ARNI [x] Yes [] No Entresto 49/51 mg bid  Beta blocker [x] Yes [] No Carvedilol 3.125 mg (don't increase 2/2 prior symptomatic bradycardia)  MRA  [] Yes [x] No   SGLT2 inhibitor [x] Yes [] No Jardiance 10 mg qd  Loop diuretic  [x] Yes [] No Furosemide 20 mg qd   Social Hx:      Tobacco:snuff  Alcohol: no  Diet:    generally healthy, low sodium diet, low fat  Exercise: activities of daily living  Home BP readings:  none    Adherence Assessment  Do you ever forget to take your medication? [] Yes [x] No  Do you ever skip doses due to side effects? [] Yes [x] No  Do you have trouble affording your medicines? [] Yes [x] No  Are you ever unable to pick up your medication due to transportation difficulties? [] Yes [x] No  Do you ever stop taking your medications because you don't believe they are helping? [] Yes [x] No  Do you check your weight daily? [] Yes [x] No   Adherence strategy: pill box  Barriers to obtaining medications: none  Accessory Clinical Findings    Lab Results  Component Value Date   CREATININE 1.23 (H) 03/25/2022   BUN 16 03/25/2022   NA 142 03/25/2022   K 4.8 03/25/2022   CL 104 03/25/2022   CO2 22 03/25/2022   Lab Results  Component Value Date   ALT 20 11/30/2020   AST 21 11/30/2020   ALKPHOS 71 11/30/2020   BILITOT 1.1 11/30/2020   No results found for: "HGBA1C"  Home Medications/Allergies    Current  Outpatient Medications  Medication Sig Dispense Refill   carvedilol (COREG) 6.25 MG tablet Take 1 tablet (6.25 mg total) by mouth 2 (two) times daily. 180 tablet 3   acetaminophen (TYLENOL) 325 MG tablet Take 325 mg by mouth every 6 (six) hours as needed.     acetaminophen (TYLENOL) 500 MG tablet Take 1 tablet (500 mg total) by mouth every 6 (six) hours as needed for moderate pain. (Patient taking differently: Take 1,000 mg by mouth every 6 (six) hours as needed for moderate pain.) 30 tablet 0   apixaban (ELIQUIS) 2.5 MG TABS tablet Take 1 tablet (2.5 mg total) by mouth 2 (two) times daily. 180 tablet 1   atorvastatin (LIPITOR) 80 MG tablet Take 80 mg by mouth at bedtime.      azithromycin  (ZITHROMAX) 250 MG tablet Take 1 tablet (250 mg total) by mouth daily. Take first 2 tablets together, then 1 every day until finished. 6 tablet 0   cyclobenzaprine (FLEXERIL) 10 MG tablet Take 5-10 mg by mouth daily as needed for muscle spasms.     diclofenac Sodium (VOLTAREN) 1 % GEL Apply 4 g topically 4 (four) times daily. 100 g 0   diphenhydrAMINE (BENADRYL) 25 MG tablet Take 25 mg by mouth every 6 (six) hours as needed for allergies.     empagliflozin (JARDIANCE) 10 MG TABS tablet Take 1 tablet (10 mg total) by mouth daily before breakfast. 30 tablet 11   furosemide (LASIX) 20 MG tablet Take 1 tablet (20 mg total) by mouth daily. 90 tablet 3   levothyroxine (SYNTHROID, LEVOTHROID) 50 MCG tablet Take 50 mcg by mouth daily before breakfast.      lidocaine (LIDODERM) 5 % Place 1 patch onto the skin daily. Remove & Discard patch within 12 hours or as directed by MD 5 patch 0   lidocaine (LIDODERM) 5 % Place 1 patch onto the skin daily. Remove & Discard patch within 12 hours or as directed by MD 30 patch 0   nitroGLYCERIN (NITROSTAT) 0.4 MG SL tablet Place 1 tablet (0.4 mg total) under the tongue every 5 (five) minutes as needed for chest pain. 25 tablet 6   Polyethyl Glycol-Propyl Glycol (SYSTANE OP) Place 1 drop into both eyes daily as needed (dry eye).     sacubitril-valsartan (ENTRESTO) 49-51 MG Take 1 tablet by mouth 2 (two) times daily. 180 tablet 3   traMADol (ULTRAM) 50 MG tablet Take 50 mg by mouth 2 (two) times daily.     No current facility-administered medications for this visit.     Allergies  Allergen Reactions   Penicillins Hives    Did it involve swelling of the face/tongue/throat, SOB, or low BP? No Did it involve sudden or severe rash/hives, skin peeling, or any reaction on the inside of your mouth or nose? No Did you need to seek medical attention at a hospital or doctor's office? No When did it last happen? Unk    If all above answers are "NO", may proceed with  cephalosporin use.    Sulfa Antibiotics Hives and Swelling     BP 156/71, dropped to 144/70 on repeat  Assessment & Plan   Acute on chronic systolic CHF (congestive heart failure) (HCC) Assessment: BP in office today is 144/70 Patient with HFrEF - 30-35% by echo Tolerates current medications without any side effects Denies SOB, palpitation, chest pain, headaches,or edema No concerns with regards to cost of medications, compliance, ability to pick up at pharmacy Reiterated the importance of regular  exercise and low salt diet  Plan: GDMT ACEI/ARB/ARNI Entresto 49/51   Beta blocker carvedilol Increase to 6.25 mg bid today  MRA  Last K at 4.8, GFR 40, will hold off for now  SGLT2 Dapagliflozin 10 mg    Patient not interested in starting new medication at this time Labs orderd:  none Follow up with Dr. Sallyanne Kuster on March 4    Tommy Medal PharmD [facility] [place]  [address] [place], [place] [address] [phone]

## 2022-04-01 ENCOUNTER — Ambulatory Visit: Payer: Medicare PPO | Attending: Cardiology | Admitting: Pharmacist Clinician (PhC)/ Clinical Pharmacy Specialist

## 2022-04-01 ENCOUNTER — Encounter: Payer: Self-pay | Admitting: *Deleted

## 2022-04-01 ENCOUNTER — Encounter: Payer: Self-pay | Admitting: Pharmacist Clinician (PhC)/ Clinical Pharmacy Specialist

## 2022-04-01 VITALS — BP 144/70 | HR 72 | Ht 63.0 in | Wt 140.0 lb

## 2022-04-01 DIAGNOSIS — I5023 Acute on chronic systolic (congestive) heart failure: Secondary | ICD-10-CM

## 2022-04-01 DIAGNOSIS — I10 Essential (primary) hypertension: Secondary | ICD-10-CM

## 2022-04-01 MED ORDER — CARVEDILOL 6.25 MG PO TABS: 6.2500 mg | ORAL_TABLET | Freq: Two times a day (BID) | ORAL | 3 refills | Status: DC

## 2022-04-01 NOTE — Patient Instructions (Signed)
Return for a follow-up appointment on with Dr. Sallyanne Kuster on March 4 at 11 am  Your blood pressure today is 144/70 (your goal is <130/80)   Your heart rate today is 72  (goal between 50-100)  Medication changes made during today's appointment: Increase carvedilol to 6.25 mg twice daily (you can take 2 of the 3.125 mg tablets twice daily until they are gone).  We increased the dose of carvedilol today. Please record and bring your home blood pressure and heart rate readings to your next appointment. It is important to notify your doctor if you notice your heart rate is below 50 or if you start to feel symptoms associated with low blood pressure such as dizziness, fatigue, or light-headedness. We will not increase the dose further if you start to experience these symptoms.   How to take blood pressure at home:  Rest for 5 minutes before measuring   Sit up straight with both feet on the floor (don't cross your legs). Use the appropriate size cuff; it should fit smoothly and snugly around your bare upper arm. Make sure the bottom edge of the cuff is about an inch above the crease of your elbow.  Make sure the arm that the cuff is on is relaxed and elevated to about heart level. This can usually be accomplished by resting your arm on a table or arm rest of a chair.  Remember: do not smoke, drink caffeine, eat a meal, or exercise for at least 30 minutes before taking your blood pressure.  Other steps to take specific to your condition:  Limit salt intake: try salt substitutes such as Mrs. Dash, onion powder, or black pepper.  Daily weights: weigh yourself each morning before eating/drinking but after urinating. Physical activity: try to increase physical activity. Even walking 30 minutes daily helps.   Call your doctor if these symptoms occur:  If you notice weight gain exceeding 3 pounds in a day or 5 pounds in a week  If you notice swelling around your legs, calves, ankles, and/or feet  If you  become short of breath or find that it is more difficult than usual to move around  If you have to use pillows or sit up at night due to not being able to breathe while laying down

## 2022-04-03 ENCOUNTER — Encounter: Payer: Self-pay | Admitting: Pharmacist Clinician (PhC)/ Clinical Pharmacy Specialist

## 2022-04-03 NOTE — Assessment & Plan Note (Signed)
Assessment: BP in office today is 144/70 Patient with HFrEF - 30-35% by echo Tolerates current medications without any side effects Denies SOB, palpitation, chest pain, headaches,or edema No concerns with regards to cost of medications, compliance, ability to pick up at pharmacy Reiterated the importance of regular exercise and low salt diet  Plan: GDMT ACEI/ARB/ARNI Entresto 49/51   Beta blocker carvedilol Increase to 6.25 mg bid today  MRA  Last K at 4.8, GFR 40, will hold off for now  SGLT2 Dapagliflozin 10 mg    Patient not interested in starting new medication at this time Labs orderd:  none Follow up with Dr. Sallyanne Kuster on March 4

## 2022-04-08 DIAGNOSIS — E890 Postprocedural hypothyroidism: Secondary | ICD-10-CM | POA: Diagnosis not present

## 2022-04-08 DIAGNOSIS — M1990 Unspecified osteoarthritis, unspecified site: Secondary | ICD-10-CM | POA: Diagnosis not present

## 2022-04-08 DIAGNOSIS — E785 Hyperlipidemia, unspecified: Secondary | ICD-10-CM | POA: Diagnosis not present

## 2022-04-08 DIAGNOSIS — I252 Old myocardial infarction: Secondary | ICD-10-CM | POA: Diagnosis not present

## 2022-04-08 DIAGNOSIS — K5900 Constipation, unspecified: Secondary | ICD-10-CM | POA: Diagnosis not present

## 2022-04-08 DIAGNOSIS — D6869 Other thrombophilia: Secondary | ICD-10-CM | POA: Diagnosis not present

## 2022-04-08 DIAGNOSIS — K219 Gastro-esophageal reflux disease without esophagitis: Secondary | ICD-10-CM | POA: Diagnosis not present

## 2022-04-08 DIAGNOSIS — K08409 Partial loss of teeth, unspecified cause, unspecified class: Secondary | ICD-10-CM | POA: Diagnosis not present

## 2022-04-08 DIAGNOSIS — I2510 Atherosclerotic heart disease of native coronary artery without angina pectoris: Secondary | ICD-10-CM | POA: Diagnosis not present

## 2022-04-09 ENCOUNTER — Encounter: Payer: Self-pay | Admitting: Pharmacist Clinician (PhC)/ Clinical Pharmacy Specialist

## 2022-04-10 MED ORDER — CARVEDILOL 3.125 MG PO TABS: 3.1250 mg | ORAL_TABLET | Freq: Two times a day (BID) | ORAL | 3 refills | Status: DC

## 2022-04-12 ENCOUNTER — Ambulatory Visit (INDEPENDENT_AMBULATORY_CARE_PROVIDER_SITE_OTHER): Payer: Medicare PPO

## 2022-04-12 DIAGNOSIS — I5023 Acute on chronic systolic (congestive) heart failure: Secondary | ICD-10-CM | POA: Diagnosis not present

## 2022-04-12 LAB — CUP PACEART REMOTE DEVICE CHECK
Battery Remaining Longevity: 86 mo
Battery Voltage: 2.98 V
Brady Statistic RV Percent Paced: 99.69 %
Date Time Interrogation Session: 20240215234504
Implantable Lead Connection Status: 753985
Implantable Lead Implant Date: 20190522
Implantable Lead Location: 753860
Implantable Lead Model: 5076
Implantable Pulse Generator Implant Date: 20190522
Lead Channel Impedance Value: 323 Ohm
Lead Channel Impedance Value: 418 Ohm
Lead Channel Pacing Threshold Amplitude: 0.625 V
Lead Channel Pacing Threshold Pulse Width: 0.4 ms
Lead Channel Sensing Intrinsic Amplitude: 7.875 mV
Lead Channel Sensing Intrinsic Amplitude: 7.875 mV
Lead Channel Setting Pacing Amplitude: 2.5 V
Lead Channel Setting Pacing Pulse Width: 0.4 ms
Lead Channel Setting Sensing Sensitivity: 1.2 mV
Zone Setting Status: 755011

## 2022-04-24 DIAGNOSIS — M546 Pain in thoracic spine: Secondary | ICD-10-CM | POA: Diagnosis not present

## 2022-04-29 ENCOUNTER — Ambulatory Visit: Payer: Medicare PPO | Admitting: Cardiovascular Disease

## 2022-05-05 ENCOUNTER — Other Ambulatory Visit: Payer: Self-pay | Admitting: Cardiovascular Disease

## 2022-05-07 NOTE — Progress Notes (Signed)
Remote pacemaker transmission.   

## 2022-05-13 ENCOUNTER — Other Ambulatory Visit: Payer: Self-pay | Admitting: Cardiovascular Disease

## 2022-05-13 DIAGNOSIS — N1832 Chronic kidney disease, stage 3b: Secondary | ICD-10-CM | POA: Diagnosis not present

## 2022-05-13 DIAGNOSIS — N183 Chronic kidney disease, stage 3 unspecified: Secondary | ICD-10-CM | POA: Diagnosis not present

## 2022-05-13 NOTE — Telephone Encounter (Signed)
Prescription refill request for Eliquis received. Indication: AF Last office visit: 12/17/21  Jerilynn Mages Croitoru MD Scr: 1.23 on 03/25/22 Age: 89 Weight: 64.5kg  Pt is on Eliquis 2.5mg  twice daily due to age and Hx of Scr > 1.5.  Current Scr is 1.23.  Will continue current dose.  Pt is being follow by Pharmacy for CHF.  Refill approved.

## 2022-05-20 ENCOUNTER — Ambulatory Visit: Payer: Medicare PPO | Attending: Cardiovascular Disease | Admitting: Cardiovascular Disease

## 2022-05-20 ENCOUNTER — Encounter: Payer: Self-pay | Admitting: Cardiovascular Disease

## 2022-05-20 VITALS — BP 134/58 | HR 78 | Ht 62.0 in | Wt 141.4 lb

## 2022-05-20 DIAGNOSIS — I4821 Permanent atrial fibrillation: Secondary | ICD-10-CM | POA: Diagnosis not present

## 2022-05-20 DIAGNOSIS — Z950 Presence of cardiac pacemaker: Secondary | ICD-10-CM | POA: Diagnosis not present

## 2022-05-20 DIAGNOSIS — I5042 Chronic combined systolic (congestive) and diastolic (congestive) heart failure: Secondary | ICD-10-CM | POA: Diagnosis not present

## 2022-05-20 DIAGNOSIS — E7800 Pure hypercholesterolemia, unspecified: Secondary | ICD-10-CM

## 2022-05-20 DIAGNOSIS — I10 Essential (primary) hypertension: Secondary | ICD-10-CM | POA: Diagnosis not present

## 2022-05-20 DIAGNOSIS — I25810 Atherosclerosis of coronary artery bypass graft(s) without angina pectoris: Secondary | ICD-10-CM | POA: Diagnosis not present

## 2022-05-20 DIAGNOSIS — D6869 Other thrombophilia: Secondary | ICD-10-CM | POA: Diagnosis not present

## 2022-05-20 NOTE — Progress Notes (Unsigned)
Cardiology Office Note:    Date:  05/22/2022   ID:  Stephanie Atkinson, DOB June 07, 1924, MRN [id number]  PCP:  Deon Pilling, NP   [place]  Cardiologist:  Quay Burow, MD Stephanie Atkinson(pacemaker) Advanced Practice Provider:  No care team member to display Electrophysiologist:  None       Referring MD: Deon Pilling, NP   Chief Complaint  Patient presents with   Congestive Heart Failure   Atrial Fibrillation   Pacemaker Check    History of Present Illness:    Stephanie Atkinson is a 89 y.o. female with a hx of CAD s/p CABG, permanent atrial fibrillation with slow ventricular response and a history of syncope followed by single-chamber permanent pacemaker (Medtronic Azure XT SR implanted May 2019).  She developed heart failure with reduced left ventricular systolic function in October 2023.  She is doing better.  She has not had problems with orthopnea and PND, "with fluid coming up in her throat" like she had last winter.  She is now taking Entresto, carvedilol, Jardiance but is not on spironolactone (treatment with spironolactone in the past was associated with renal dysfunction).  She is on a very low-dose of loop diuretic.  She denies dizziness, palpitations or syncope.  She does not have chest pain at rest or with activity.  She denies focal neurological complaints.  She has not had any falls or bleeding on treatment with Eliquis.  Creatinine, which increased markedly following treatment with diuretics last fall is down to 1.1-1.2, which appears to be her baseline.  Over time, she has had increasingly higher burden of ventricular pacing, which is still around 100% due to atrial fibrillation with slow ventricular response.  She is only on a minimum dose of carvedilol.  To a large extent, her low EF could be explained by pacing induced dyssynchrony.  She has not had problems with rapid ventricular rates.    Last October her echocardiogram showed markedly  reduced left  ventricular systolic function, with EF down to 30-35%.    Pacemaker interrogation shows normal device function.  She is not pacemaker dependent.  The underlying rhythm is atrial fibrillation with a ventricular rate around 45 bpm.  She has 99.8 % ventricular pacing.  The heart rate histogram distribution appears appropriate.  There have been no recent episodes of high ventricular rates.  Activities roughly 1.3 hours/day.  Her paced QRS is very broad at 176 ms.  Past Medical History:  Diagnosis Date   Chronic kidney disease, stage 3 (HCC)    Coronary artery disease    Hx of CABG 2006   LIMA-LAD, free RIMA-PDA, Radial-OM1-OM2   Hyperlipidemia    Hypertension    Hypothyroid    MI, old        PAF (paroxysmal atrial fibrillation) (HCC)    was on amio, d/c'd due to prolonged PR interval, rate control   Presence of permanent cardiac pacemaker 07/16/2017   Presence of stent in right coronary artery 07/12   RIMA-PDA occluded, DES stent placed   Syncope 07/12   bradycardic, beta blocker decreased, also felt to be dehydrated    Past Surgical History:  Procedure Laterality Date   CARDIAC CATHETERIZATION  [id number]   dr. Shanon Brow harding, revealing a atretic bypass to her right with patent distal RCA stent   CAROTID STENT  2012   CORONARY ARTERY BYPASS GRAFT  2006   LIMA-LAD, free RIMA-PDA, Radial-OM1-OM2   DOPPLER ECHOCARDIOGRAPHY  [id number]   mild asymmetric left ventricular  hypertrophy, left ventricular systolic function is low normal, ejection fraction = 50-55%, the LA is moderate dilated, the RA is mildly dilated, no significant valvular disease   INSERT / REPLACE / REMOVE PACEMAKER  07/16/2017   JOINT REPLACEMENT     hip replacement   LEFT HEART CATHETERIZATION WITH CORONARY/GRAFT ANGIOGRAM N/A 10/12/2012   Procedure: LEFT HEART CATHETERIZATION WITH Beatrix Fetters;  Surgeon: Leonie Man, MD;  Location: [facility] CATH LAB;  Service: Cardiovascular;  Laterality: N/A;   LOOP RECORDER  IMPLANT N/A 10/15/2012   Procedure: LOOP RECORDER IMPLANT;  Surgeon: Sanda Klein, MD;  Location: [place] CATH LAB;  Service: Cardiovascular;  Laterality: N/A;   LOOP RECORDER REMOVAL  07/16/2017   LOOP RECORDER REMOVAL N/A 07/16/2017   Procedure: LOOP RECORDER REMOVAL;  Surgeon: Sanda Klein, MD;  Location: [place] CV LAB;  Service: Cardiovascular;  Laterality: N/A;   NM MYOVIEW LTD  [id number]   post stress left ventricle is normal in size, post stress ejection fraction is 67% global left ventricular systolic function is normal, normal myocardial perfusion study, abnormal myocardial perfusion study, low risk scan   PACEMAKER IMPLANT N/A 07/16/2017   Procedure: PACEMAKER IMPLANT;  Surgeon: Sanda Klein, MD;  Location: [place] CV LAB;  Service: Cardiovascular;  Laterality: N/A;   THYROIDECTOMY      Current Medications: Current Meds  Medication Sig   acetaminophen (TYLENOL) 500 MG tablet Take 1 tablet (500 mg total) by mouth every 6 (six) hours as needed for moderate pain. (Patient taking differently: Take 1,000 mg by mouth every 6 (six) hours as needed for moderate pain.)   atorvastatin (LIPITOR) 80 MG tablet Take 80 mg by mouth at bedtime.    azithromycin (ZITHROMAX) 250 MG tablet Take 1 tablet (250 mg total) by mouth daily. Take first 2 tablets together, then 1 every day until finished.   carvedilol (COREG) 3.125 MG tablet Take 1 tablet (3.125 mg total) by mouth 2 (two) times daily.   cyclobenzaprine (FLEXERIL) 10 MG tablet Take 5-10 mg by mouth daily as needed for muscle spasms.   diclofenac Sodium (VOLTAREN) 1 % GEL Apply 4 g topically 4 (four) times daily.   ELIQUIS 2.5 MG TABS tablet TAKE 1 TABLET BY MOUTH TWICE A DAY   empagliflozin (JARDIANCE) 10 MG TABS tablet Take 1 tablet (10 mg total) by mouth daily before breakfast.   furosemide (LASIX) 20 MG tablet Take 1 tablet (20 mg total) by mouth daily.   levothyroxine (SYNTHROID, LEVOTHROID) 50 MCG tablet Take 50 mcg by mouth daily  before breakfast.    lidocaine (LIDODERM) 5 % Place 1 patch onto the skin daily. Remove & Discard patch within 12 hours or as directed by MD   Polyethyl Glycol-Propyl Glycol (SYSTANE OP) Place 1 drop into both eyes daily as needed (dry eye).   sacubitril-valsartan (ENTRESTO) 49-51 MG Take 1 tablet by mouth 2 (two) times daily.   traMADol (ULTRAM) 50 MG tablet Take 50 mg by mouth 2 (two) times daily.   VITAMIN D3 50 MCG (2000 UT) capsule Take 1 capsule by mouth daily.     Allergies:   Penicillins and Sulfa antibiotics   Social History   Socioeconomic History   Marital status: Single    Spouse name: Not on file   Number of children: Not on file   Years of education: Not on file   Highest education level: Not on file  Occupational History   Not on file  Tobacco Use   Smoking status: Never  Smokeless tobacco: Current    Types: Snuff  Vaping Use   Vaping Use: Never used  Substance and Sexual Activity   Alcohol use: No   Drug use: No   Sexual activity: Not on file  Other Topics Concern   Not on file  Social History Narrative   Not on file   Social Determinants of Health   Financial Resource Strain: Not on file  Food Insecurity: No Food Insecurity (12/12/2021)   Hunger Vital Sign    Worried About Running Out of Food in the Last Year: Never true    Ran Out of Food in the Last Year: Never true  Transportation Needs: No Transportation Needs (12/12/2021)   PRAPARE - Hydrologist (Medical): No    Lack of Transportation (Non-Medical): No  Physical Activity: Not on file  Stress: Not on file  Social Connections: Not on file     Family History: The patient's family history includes CAD in her maternal grandmother and mother.  ROS:   Please see the history of present illness.     All other systems reviewed and are negative.  EKGs/Labs/Other Studies Reviewed:    The following studies were reviewed today: Comprehensive pacemaker check in the  office today.  EKG:  EKG is ordered today shows atrial fibrillation with ventricular pacing.  The ventricular paced beats are very broad with a QRS of 176 ms, QTc 487 ms Recent Labs: 12/09/2021: B Natriuretic Peptide 184.4 12/10/2021: Magnesium 2.0; TSH 1.602 03/12/2022: Hemoglobin 13.5; Platelets 194 03/25/2022: BUN 16; Creatinine, Ser 1.23; Potassium 4.8; Sodium 142  Recent Lipid Panel    Component Value Date/Time   CHOL 127 11/25/2016 1005   TRIG 117 11/25/2016 1005   HDL 46 11/25/2016 1005   CHOLHDL 2.8 11/25/2016 1005   CHOLHDL 2.6 01/05/2015 1118   VLDL 18 01/05/2015 1118   LDLCALC 58 11/25/2016 1005   01/12/2020  HDL 45, LDL 51, triglycerides 77 Hemoglobin 12.8, TSH 1.59  Risk Assessment/Calculations:    CHA2DS2-VASc Score = 6 This indicates a 9.7% annual risk of stroke. The patient's score is based upon: CHF History: Yes HTN History: Yes Diabetes History: No Stroke History: No Vascular Disease History: Yes Age Score: 2 Gender Score: 1      Physical Exam:    VS:  BP (!) 134/58 (BP Location: Left Arm, Patient Position: Sitting, Cuff Size: Normal)   Pulse 78   Ht 5\' 2"  (1.575 m)   Wt 141 lb 6.4 oz (64.1 kg)   SpO2 96%   BMI 25.86 kg/m     Wt Readings from Last 3 Encounters:  05/20/22 141 lb 6.4 oz (64.1 kg)  04/01/22 140 lb (63.5 kg)  02/26/22 140 lb (63.5 kg)      General: Alert, oriented x3, no distress, appears younger than stated age.  Healthy right subclavian pacemaker site. Head: no evidence of trauma, PERRL, EOMI, no exophtalmos or lid lag, no myxedema, no xanthelasma; normal ears, nose and oropharynx Neck: normal jugular venous pulsations and no hepatojugular reflux; brisk carotid pulses without delay and no carotid bruits Chest: clear to auscultation, no signs of consolidation by percussion or palpation, normal fremitus, symmetrical and full respiratory excursions Cardiovascular: normal position and quality of the apical impulse, regular rhythm,  normal first and second heart sounds, no murmurs, rubs or gallops Abdomen: no tenderness or distention, no masses by palpation, no abnormal pulsatility or arterial bruits, normal bowel sounds, no hepatosplenomegaly Extremities: no clubbing, cyanosis or edema; 2+  radial, ulnar and brachial pulses bilaterally; 2+ right femoral, posterior tibial and dorsalis pedis pulses; 2+ left femoral, posterior tibial and dorsalis pedis pulses; no subclavian or femoral bruits Neurological: grossly nonfocal Psych: Normal mood and affect    ASSESSMENT:    1. Chronic combined systolic and diastolic heart failure ([place])   2. Coronary artery disease involving coronary bypass graft of native heart without angina pectoris   3. Permanent atrial fibrillation ([place])   4. Acquired thrombophilia ([place])   5. Pacemaker   6. Essential hypertension   7. Hypercholesterolemia     PLAN:    In order of problems listed above:  CHF: Clinically she appears to be euvolemic.  Although she still has NYHA functional class II status, she has not had any symptoms of dyspnea at rest.  Will check an echocardiogram.  Hopefully will see there is improvement in LVEF.  She is on [place], Jardiance, carvedilol.  Spironolactone had to be stopped in the past due to worsening renal dysfunction.  Since she has virtually 100% ventricular pacing anyway, we could go up on the dose of carvedilol. CAD s/p CABG: Asymptomatic.  She is not on any antianginal medicines other than the recently added low-dose of carvedilol.  It is possible that she has worsening coronary status, but she is not a great candidate for angiography or revascularization due to renal dysfunction.  Had bypass surgery in 2006 and last cardiac catheterization in 2014, all conduits open.  On high-dose statin.  Not on aspirin due to full anticoagulation. AFib: Permanent arrhythmia.  Has chronic slow ventricular response and she required 100% ventricular pacing even before we started the  carvedilol.  On anticoagulation. Anticoagulation: Eliquis dose adjusted for age and small body size.  Renal function has improved. Pacemaker: Normal device function.  100% ventricular pacing.  If EF has not improved substantially on the echocardiogram consider referral for CRT-P, even though she is elderly. HTN: Normal BP on current medications.  There is room to increase her carvedilol if we decide to titrate this further. HLP: On maximum dose atorvastatin, she has had LDL cholesterol levels in the 50s for the last few years.        Medication Adjustments/Labs and Tests Ordered: Current medicines are reviewed at length with the patient today.  Concerns regarding medicines are outlined above.  Orders Placed This Encounter  Procedures   EKG 12-Lead   ECHOCARDIOGRAM COMPLETE   No orders of the defined types were placed in this encounter.   Patient Instructions  Medication Instructions:  No changes *If you need a refill on your cardiac medications before your next appointment, please call your pharmacy*  Testing/Procedures: Your physician has requested that you have an echocardiogram in 2 months. Echocardiography is a painless test that uses sound waves to create images of your heart. It provides your doctor with information about the size and shape of your heart and how well your heart's chambers and valves are working. This procedure takes approximately one hour. There are no restrictions for this procedure. Please do NOT wear cologne, perfume, aftershave, or lotions (deodorant is allowed). Please arrive 15 minutes prior to your appointment time.    Follow-Up: At [facility], you and your health needs are our priority.  As part of our continuing mission to provide you with exceptional heart care, we have created designated Provider Care Teams.  These Care Teams include your primary Cardiologist (physician) and Advanced Practice Providers (APPs -  Physician Assistants and  Nurse Practitioners) who  all work together to provide you with the care you need, when you need it.  We recommend signing up for the patient portal called "MyChart".  Sign up information is provided on this After Visit Summary.  MyChart is used to connect with patients for Virtual Visits (Telemedicine).  Patients are able to view lab/test results, encounter notes, upcoming appointments, etc.  Non-urgent messages can be sent to your provider as well.   To learn more about what you can do with MyChart, go to [email or web address].    Your next appointment:    3-4 months- Phys Pacer Check  Provider:   Dr Sallyanne Kuster    Signed, Sanda Klein, MD  05/22/2022 5:07 PM    [place]

## 2022-05-20 NOTE — Patient Instructions (Addendum)
Medication Instructions:  No changes *If you need a refill on your cardiac medications before your next appointment, please call your pharmacy*  Testing/Procedures: Your physician has requested that you have an echocardiogram in 2 months. Echocardiography is a painless test that uses sound waves to create images of your heart. It provides your doctor with information about the size and shape of your heart and how well your heart's chambers and valves are working. This procedure takes approximately one hour. There are no restrictions for this procedure. Please do NOT wear cologne, perfume, aftershave, or lotions (deodorant is allowed). Please arrive 15 minutes prior to your appointment time.    Follow-Up: At [facility], you and your health needs are our priority.  As part of our continuing mission to provide you with exceptional heart care, we have created designated Provider Care Teams.  These Care Teams include your primary Cardiologist (physician) and Advanced Practice Providers (APPs -  Physician Assistants and Nurse Practitioners) who all work together to provide you with the care you need, when you need it.  We recommend signing up for the patient portal called "MyChart".  Sign up information is provided on this After Visit Summary.  MyChart is used to connect with patients for Virtual Visits (Telemedicine).  Patients are able to view lab/test results, encounter notes, upcoming appointments, etc.  Non-urgent messages can be sent to your provider as well.   To learn more about what you can do with MyChart, go to [email or web address].    Your next appointment:    3-4 months- Phys Pacer Check  Provider:   Dr Sallyanne Kuster

## 2022-05-21 ENCOUNTER — Ambulatory Visit (HOSPITAL_COMMUNITY): Payer: Medicare PPO | Attending: Cardiovascular Disease

## 2022-05-21 DIAGNOSIS — I5042 Chronic combined systolic (congestive) and diastolic (congestive) heart failure: Secondary | ICD-10-CM

## 2022-05-21 DIAGNOSIS — R300 Dysuria: Secondary | ICD-10-CM | POA: Diagnosis not present

## 2022-05-22 ENCOUNTER — Encounter: Payer: Self-pay | Admitting: Cardiovascular Disease

## 2022-05-22 LAB — ECHOCARDIOGRAM COMPLETE
Area-P 1/2: 2.84 cm2
S' Lateral: 3.4 cm

## 2022-05-27 ENCOUNTER — Other Ambulatory Visit: Payer: Self-pay | Admitting: Emergency Medicine

## 2022-05-27 MED ORDER — EMPAGLIFLOZIN 10 MG PO TABS: 10.0000 mg | ORAL_TABLET | Freq: Every day | ORAL | 3 refills | Status: DC

## 2022-05-27 NOTE — Progress Notes (Signed)
Converting Jardiance to a 90 day supply as suggested by [facility] for medication adherence

## 2022-06-03 DIAGNOSIS — I130 Hypertensive heart and chronic kidney disease with heart failure and stage 1 through stage 4 chronic kidney disease, or unspecified chronic kidney disease: Secondary | ICD-10-CM | POA: Diagnosis not present

## 2022-06-03 DIAGNOSIS — N1831 Chronic kidney disease, stage 3a: Secondary | ICD-10-CM | POA: Diagnosis not present

## 2022-06-03 DIAGNOSIS — I5032 Chronic diastolic (congestive) heart failure: Secondary | ICD-10-CM | POA: Diagnosis not present

## 2022-06-03 DIAGNOSIS — R7303 Prediabetes: Secondary | ICD-10-CM | POA: Diagnosis not present

## 2022-06-03 DIAGNOSIS — E213 Hyperparathyroidism, unspecified: Secondary | ICD-10-CM | POA: Diagnosis not present

## 2022-06-13 ENCOUNTER — Telehealth: Payer: Self-pay | Admitting: Orthopaedic Surgery

## 2022-06-13 NOTE — Telephone Encounter (Signed)
I called Stephanie Atkinson, worked in to schedule tomorrow afternoon.

## 2022-06-13 NOTE — Telephone Encounter (Signed)
Patient's daughter [name] called asked if her mother can be worked into Dr Ophelia Charter schedule tomorrow. [name] said her mom is hurting in her lower back and the pain is radiating down her leg. The number to contact [name] is  [phone]

## 2022-06-14 ENCOUNTER — Ambulatory Visit: Payer: Medicare PPO | Admitting: Orthopaedic Surgery

## 2022-06-14 VITALS — BP 157/79 | HR 76

## 2022-06-14 DIAGNOSIS — M545 Low back pain, unspecified: Secondary | ICD-10-CM

## 2022-06-14 MED ORDER — PREDNISONE 5 MG (21) PO TBPK: ORAL_TABLET | ORAL | 0 refills | Status: DC

## 2022-06-14 NOTE — Progress Notes (Signed)
Office Visit Note   Patient: Stephanie Atkinson           Date of Birth: 1924-08-27           MRN: [id number] Visit Date: 06/14/2022              Requested by: Linus Galas, NP [address] [place],  [place] [address] PCP: Linus Galas, NP   Assessment & Plan: Visit Diagnoses:  1. Acute right-sided low back pain without sciatica     Plan: Patient cannot have an MRI with her pacemaker.  Will place her on some reduced dose prednisone we discussed potential risk for GI bleeding since she is on Eliquis.  Hopefully she can settle down with some prednisone to use off her recent right-sided back pain symptoms.  Follow-Up Instructions: No follow-ups on file.   Orders:  No orders of the defined types were placed in this encounter.  Meds ordered this encounter  Medications   predniSONE (STERAPRED UNI-PAK 21 TAB) 5 MG (21) TBPK tablet    Sig: Take 6,5,4,3,2,1 one tablet less each day with meals.    Dispense:  21 tablet    Refill:  0      Procedures: No procedures performed   Clinical Data: No additional findings.   Subjective: Chief Complaint  Patient presents with   Lower Back - Pain    HPI 89 year old female long-term patient with pacemaker on Eliquis had back pain pain on the right side.  She is using a cane she does have a walker at home after her cemented right total hip surgery.  She does note some pain with prolonged standing.  No recent falls.  Pains right side of her back.  Recent CT renal study negative for stones.  She had CT lumbar 02/26/2022 which was negative other than some stenosis at L4-5 moderate to severe.  Review of Systems no associated bowel or bladder symptoms with HPI.   Objective: Vital Signs: BP (!) 157/79   Pulse 76   Physical Exam Constitutional:      Appearance: She is well-developed.  HENT:     Head: Normocephalic.     Right Ear: External ear normal.     Left Ear: External ear normal. There is no impacted cerumen.  Eyes:      Pupils: Pupils are equal, round, and reactive to light.  Neck:     Thyroid: No thyromegaly.     Trachea: No tracheal deviation.  Cardiovascular:     Rate and Rhythm: Normal rate.  Pulmonary:     Effort: Pulmonary effort is normal.  Abdominal:     Palpations: Abdomen is soft.  Musculoskeletal:     Cervical back: No rigidity.  Skin:    General: Skin is warm and dry.  Neurological:     Mental Status: She is alert and oriented to person, place, and time.  Psychiatric:        Behavior: Behavior normal.     Ortho Exam patient can ambulate with her cane.  Heel-toe gait.  Anterior tib gastrocsoleus is strong with resisted testing no quad or hip flexion weakness.  Negative logroll to hips right and left.  Reflex 1+ symmetrical.  Specialty Comments:  No specialty comments available.  Imaging: No results found.   PMFS History: Patient Active Problem List   Diagnosis Date Noted   Low back pain 06/14/2022   CHF (congestive heart failure) 12/11/2021   Acute on chronic systolic CHF (congestive heart failure) 12/09/2021  Venous stasis dermatitis of both lower extremities 01/06/2020   Unilateral primary osteoarthritis, right knee 01/06/2020   Community acquired bacterial pneumonia 06/23/2019   AKI (acute kidney injury) 06/23/2019   Synovitis of right knee 03/02/2019   Chronic diastolic heart failure 09/14/2018   Pacemaker 07/16/2017   Atrial fibrillation with slow ventricular response 06/27/2017   Hypercholesterolemia 06/27/2017   Symptomatic bradycardia s/p PPM  06/27/2017   Chronic pain of right knee 05/17/2016   De Quervain's tenosynovitis 05/17/2016   CAD (coronary artery disease) of artery bypass graft 02/13/2015   Encounter for loop recorder check 02/01/2014   Accelerated junctional rhythm 05/04/2013   Dehydration 10/14/2012   Syncope and collapse 10/13/2012   History of first degree atrioventricular block 10/11/2012   Long term (current) use of anticoagulants 10/06/2012    AVB - beta blocker and Amiodarone stopped 07/10/2012   Hypertension    Hx of CABG X 4 3/06-     Dyslipidemia    RCA DES placed 7/12- patent 8/14    Permanent atrial fibrillation    Acute kidney injury superimposed on chronic kidney disease    Past Medical History:  Diagnosis Date   Chronic kidney disease, stage 3 (HCC)    Coronary artery disease    Hx of CABG 2006   LIMA-LAD, free RIMA-PDA, Radial-OM1-OM2   Hyperlipidemia    Hypertension    Hypothyroid    MI, old        PAF (paroxysmal atrial fibrillation) (HCC)    was on amio, d/c'd due to prolonged PR interval, rate control   Presence of permanent cardiac pacemaker 07/16/2017   Presence of stent in right coronary artery 07/12   RIMA-PDA occluded, DES stent placed   Syncope 07/12   bradycardic, beta blocker decreased, also felt to be dehydrated    Family History  Problem Relation Age of Onset   CAD Mother    CAD Maternal Grandmother     Past Surgical History:  Procedure Laterality Date   CARDIAC CATHETERIZATION  [phone]   dr. Onalee Hua harding, revealing a atretic bypass to her right with patent distal RCA stent   CAROTID STENT  2012   CORONARY ARTERY BYPASS GRAFT  2006   LIMA-LAD, free RIMA-PDA, Radial-OM1-OM2   DOPPLER ECHOCARDIOGRAPHY  [id number]   mild asymmetric left ventricular hypertrophy, left ventricular systolic function is low normal, ejection fraction = 50-55%, the LA is moderate dilated, the RA is mildly dilated, no significant valvular disease   INSERT / REPLACE / REMOVE PACEMAKER  07/16/2017   JOINT REPLACEMENT     hip replacement   LEFT HEART CATHETERIZATION WITH CORONARY/GRAFT ANGIOGRAM N/A 10/12/2012   Procedure: LEFT HEART CATHETERIZATION WITH Isabel Caprice;  Surgeon: Marykay Lex, MD;  Location: [facility] CATH LAB;  Service: Cardiovascular;  Laterality: N/A;   LOOP RECORDER IMPLANT N/A 10/15/2012   Procedure: LOOP RECORDER IMPLANT;  Surgeon: Thurmon Fair, MD;  Location: [facility] CATH LAB;  Service:  Cardiovascular;  Laterality: N/A;   LOOP RECORDER REMOVAL  07/16/2017   LOOP RECORDER REMOVAL N/A 07/16/2017   Procedure: LOOP RECORDER REMOVAL;  Surgeon: Thurmon Fair, MD;  Location: [facility] INVASIVE CV LAB;  Service: Cardiovascular;  Laterality: N/A;   NM MYOVIEW LTD  [id number]   post stress left ventricle is normal in size, post stress ejection fraction is 67% global left ventricular systolic function is normal, normal myocardial perfusion study, abnormal myocardial perfusion study, low risk scan   PACEMAKER IMPLANT N/A 07/16/2017   Procedure: PACEMAKER IMPLANT;  Surgeon: Thurmon Fair,  MD;  Location: [facility] INVASIVE CV LAB;  Service: Cardiovascular;  Laterality: N/A;   THYROIDECTOMY     Social History   Occupational History   Not on file  Tobacco Use   Smoking status: Never   Smokeless tobacco: Current    Types: Snuff  Vaping Use   Vaping Use: Never used  Substance and Sexual Activity   Alcohol use: No   Drug use: No   Sexual activity: Not on file

## 2022-07-04 ENCOUNTER — Encounter: Payer: Self-pay | Admitting: Physician Assistant

## 2022-07-04 ENCOUNTER — Ambulatory Visit: Payer: Medicare PPO | Admitting: Physician Assistant

## 2022-07-04 DIAGNOSIS — M5440 Lumbago with sciatica, unspecified side: Secondary | ICD-10-CM

## 2022-07-04 NOTE — Progress Notes (Signed)
Office Visit Note   Patient: Stephanie Atkinson           Date of Birth: Jun 04, 1924           MRN: [id number] Visit Date: 07/04/2022              Requested by: Linus Galas, NP [address] [place],  [place] [address] PCP: Linus Galas, NP  Chief Complaint  Patient presents with   Left Hip - Pain      HPI: Stephanie Atkinson is a pleasant 89 year old woman who is accompanied by her daughter today.  She has had arthritis in multiple joints including her left hip and her left shoulder.  She comes in today with a 1 week history of left posterior buttock pain that radiates down her leg to her ankle.  She has no changes in her bowel or bladder.  She does see sometimes it tingles and goes numb.  The pain is acute enough sometimes that she feels like her leg might give out.  She denies any groin pain today.  Rates her pain as moderate  Assessment & Plan: Visit Diagnoses:  1. Acute bilateral low back pain with sciatica, sciatica laterality unspecified     Plan: She had recently seen Dr. Ophelia Charter for right-sided low back pain.  Today it is left.  Do not think this is coming from her arthritic hip.  Seems more radicular in nature.  She does have a CT scan from January which could coordinate with some of her left lower back symptoms.  I will try and refer her to Dr. Alvester Morin for possible ESI.  She does take Eliquis.  She cannot really take any anti-inflammatories obviously.  Per her daughter her baseline is that her mom does try to stay as active as she can and does activities of daily living including doing her laundry.  Follow-Up Instructions: No follow-ups on file.   Ortho Exam  Patient is alert, oriented, no adenopathy, well-dressed, normal affect, normal respiratory effort. Examination of her lower back she does have some tenderness over palpation of the buttock that reproduces radicular symptoms no real tenderness in the groin no tenderness with me in relation of her left hip.  She has good  plantarflexion dorsiflexion strength with her ankle she is able to sustain her leg in a straight leg raise.  Straight leg raise on the left is somewhat reproducing her symptoms that go all the way to her ankle  Imaging: No results found. No images are attached to the encounter.  Labs: Lab Results  Component Value Date   REPTSTATUS 03/14/2022 FINAL 03/11/2022   CULT 20,000 COLONIES/mL ENTEROCOCCUS FAECALIS (A) 03/11/2022   LABORGA ENTEROCOCCUS FAECALIS (A) 03/11/2022     Lab Results  Component Value Date   ALBUMIN 3.6 11/30/2020   ALBUMIN 3.9 06/19/2020   ALBUMIN 3.7 06/22/2019    Lab Results  Component Value Date   MG 2.0 12/10/2021   MG 2.2 12/11/2010   No results found for: "VD25OH"  No results found for: "PREALBUMIN"    Latest Ref Rng & Units 03/12/2022   12:13 AM 02/26/2022    1:25 PM 12/11/2021   12:40 AM  CBC EXTENDED  WBC 4.0 - 10.5 K/uL 4.0  4.0  5.6   RBC 3.87 - 5.11 MIL/uL 4.98  5.00  4.69   Hemoglobin 12.0 - 15.0 g/dL 01.0  27.2  53.6   HCT 36.0 - 46.0 % 43.2  45.1  40.4   Platelets 150 -  400 K/uL 194  170  206   NEUT# 1.7 - 7.7 K/uL 2.2  2.6    Lymph# 0.7 - 4.0 K/uL 1.2  1.0       There is no height or weight on file to calculate BMI.  Orders:  No orders of the defined types were placed in this encounter.  No orders of the defined types were placed in this encounter.    Procedures: No procedures performed  Clinical Data: No additional findings.  ROS:  All other systems negative, except as noted in the HPI. Review of Systems  Objective: Vital Signs: There were no vitals taken for this visit.  Specialty Comments:  No specialty comments available.  PMFS History: Patient Active Problem List   Diagnosis Date Noted   Low back pain 06/14/2022   CHF (congestive heart failure) (HCC) 12/11/2021   Acute on chronic systolic CHF (congestive heart failure) (HCC) 12/09/2021   Venous stasis dermatitis of both lower extremities 01/06/2020    Unilateral primary osteoarthritis, right knee 01/06/2020   Community acquired bacterial pneumonia 06/23/2019   AKI (acute kidney injury) (HCC) 06/23/2019   Synovitis of right knee 03/02/2019   Chronic diastolic heart failure (HCC) 09/14/2018   Pacemaker 07/16/2017   Atrial fibrillation with slow ventricular response (HCC) 06/27/2017   Hypercholesterolemia 06/27/2017   Symptomatic bradycardia s/p PPM  06/27/2017   Chronic pain of right knee 05/17/2016   De Quervain's tenosynovitis 05/17/2016   CAD (coronary artery disease) of artery bypass graft 02/13/2015   Encounter for loop recorder check 02/01/2014   Accelerated junctional rhythm 05/04/2013   Dehydration 10/14/2012   Syncope and collapse 10/13/2012   History of first degree atrioventricular block 10/11/2012   Long term (current) use of anticoagulants 10/06/2012   AVB - beta blocker and Amiodarone stopped 07/10/2012   Hypertension    Hx of CABG X 4 3/06-     Dyslipidemia    RCA DES placed 7/12- patent 8/14    Permanent atrial fibrillation (HCC)    Acute kidney injury superimposed on chronic kidney disease (HCC)    Past Medical History:  Diagnosis Date   Chronic kidney disease, stage 3 (HCC)    Coronary artery disease    Hx of CABG 2006   LIMA-LAD, free RIMA-PDA, Radial-OM1-OM2   Hyperlipidemia    Hypertension    Hypothyroid    MI, old        PAF (paroxysmal atrial fibrillation) (HCC)    was on amio, d/c'd due to prolonged PR interval, rate control   Presence of permanent cardiac pacemaker 07/16/2017   Presence of stent in right coronary artery 07/12   RIMA-PDA occluded, DES stent placed   Syncope 07/12   bradycardic, beta blocker decreased, also felt to be dehydrated    Family History  Problem Relation Age of Onset   CAD Mother    CAD Maternal Grandmother     Past Surgical History:  Procedure Laterality Date   CARDIAC CATHETERIZATION  [id number]   dr. Onalee Hua harding, revealing a atretic bypass to her right with  patent distal RCA stent   CAROTID STENT  2012   CORONARY ARTERY BYPASS GRAFT  2006   LIMA-LAD, free RIMA-PDA, Radial-OM1-OM2   DOPPLER ECHOCARDIOGRAPHY  [id number]   mild asymmetric left ventricular hypertrophy, left ventricular systolic function is low normal, ejection fraction = 50-55%, the LA is moderate dilated, the RA is mildly dilated, no significant valvular disease   INSERT / REPLACE / REMOVE PACEMAKER  07/16/2017   JOINT  REPLACEMENT     hip replacement   LEFT HEART CATHETERIZATION WITH CORONARY/GRAFT ANGIOGRAM N/A 10/12/2012   Procedure: LEFT HEART CATHETERIZATION WITH Isabel Caprice;  Surgeon: Marykay Lex, MD;  Location: [facility] CATH LAB;  Service: Cardiovascular;  Laterality: N/A;   LOOP RECORDER IMPLANT N/A 10/15/2012   Procedure: LOOP RECORDER IMPLANT;  Surgeon: Thurmon Fair, MD;  Location: [facility] CATH LAB;  Service: Cardiovascular;  Laterality: N/A;   LOOP RECORDER REMOVAL  07/16/2017   LOOP RECORDER REMOVAL N/A 07/16/2017   Procedure: LOOP RECORDER REMOVAL;  Surgeon: Thurmon Fair, MD;  Location: [facility] INVASIVE CV LAB;  Service: Cardiovascular;  Laterality: N/A;   NM MYOVIEW LTD  [id number]   post stress left ventricle is normal in size, post stress ejection fraction is 67% global left ventricular systolic function is normal, normal myocardial perfusion study, abnormal myocardial perfusion study, low risk scan   PACEMAKER IMPLANT N/A 07/16/2017   Procedure: PACEMAKER IMPLANT;  Surgeon: Thurmon Fair, MD;  Location: [facility] INVASIVE CV LAB;  Service: Cardiovascular;  Laterality: N/A;   THYROIDECTOMY     Social History   Occupational History   Not on file  Tobacco Use   Smoking status: Never   Smokeless tobacco: Current    Types: Snuff  Vaping Use   Vaping Use: Never used  Substance and Sexual Activity   Alcohol use: No   Drug use: No   Sexual activity: Not on file

## 2022-07-08 DIAGNOSIS — E039 Hypothyroidism, unspecified: Secondary | ICD-10-CM | POA: Diagnosis not present

## 2022-07-08 DIAGNOSIS — I700 Atherosclerosis of aorta: Secondary | ICD-10-CM | POA: Diagnosis not present

## 2022-07-08 DIAGNOSIS — I129 Hypertensive chronic kidney disease with stage 1 through stage 4 chronic kidney disease, or unspecified chronic kidney disease: Secondary | ICD-10-CM | POA: Diagnosis not present

## 2022-07-08 DIAGNOSIS — N1832 Chronic kidney disease, stage 3b: Secondary | ICD-10-CM | POA: Diagnosis not present

## 2022-07-08 DIAGNOSIS — R7303 Prediabetes: Secondary | ICD-10-CM | POA: Diagnosis not present

## 2022-07-09 LAB — LAB REPORT - SCANNED
A1c: 6.1
Albumin, Urine POC: 3
Creatinine, POC: 14.2 mg/dL
EGFR: 45
Microalb Creat Ratio: 21

## 2022-07-12 ENCOUNTER — Ambulatory Visit (INDEPENDENT_AMBULATORY_CARE_PROVIDER_SITE_OTHER): Payer: Medicare PPO

## 2022-07-12 DIAGNOSIS — I50810 Right heart failure, unspecified: Secondary | ICD-10-CM

## 2022-07-12 DIAGNOSIS — I5042 Chronic combined systolic (congestive) and diastolic (congestive) heart failure: Secondary | ICD-10-CM

## 2022-07-12 LAB — CUP PACEART REMOTE DEVICE CHECK
Battery Remaining Longevity: 83 mo
Battery Voltage: 2.98 V
Brady Statistic RV Percent Paced: 99.7 %
Date Time Interrogation Session: 20240517004356
Implantable Lead Connection Status: 753985
Implantable Lead Implant Date: 20190522
Implantable Lead Location: 753860
Implantable Lead Model: 5076
Implantable Pulse Generator Implant Date: 20190522
Lead Channel Impedance Value: 323 Ohm
Lead Channel Impedance Value: 437 Ohm
Lead Channel Pacing Threshold Amplitude: 0.75 V
Lead Channel Pacing Threshold Pulse Width: 0.4 ms
Lead Channel Sensing Intrinsic Amplitude: 8 mV
Lead Channel Sensing Intrinsic Amplitude: 8 mV
Lead Channel Setting Pacing Amplitude: 2.5 V
Lead Channel Setting Pacing Pulse Width: 0.4 ms
Lead Channel Setting Sensing Sensitivity: 1.2 mV
Zone Setting Status: 755011

## 2022-07-14 DIAGNOSIS — I5032 Chronic diastolic (congestive) heart failure: Secondary | ICD-10-CM | POA: Diagnosis not present

## 2022-07-14 DIAGNOSIS — M5442 Lumbago with sciatica, left side: Secondary | ICD-10-CM | POA: Diagnosis not present

## 2022-07-14 DIAGNOSIS — G8929 Other chronic pain: Secondary | ICD-10-CM | POA: Diagnosis not present

## 2022-07-14 DIAGNOSIS — I4891 Unspecified atrial fibrillation: Secondary | ICD-10-CM | POA: Diagnosis not present

## 2022-07-14 DIAGNOSIS — I700 Atherosclerosis of aorta: Secondary | ICD-10-CM | POA: Diagnosis not present

## 2022-07-14 DIAGNOSIS — M5441 Lumbago with sciatica, right side: Secondary | ICD-10-CM | POA: Diagnosis not present

## 2022-07-14 DIAGNOSIS — N1832 Chronic kidney disease, stage 3b: Secondary | ICD-10-CM | POA: Diagnosis not present

## 2022-07-14 DIAGNOSIS — I2510 Atherosclerotic heart disease of native coronary artery without angina pectoris: Secondary | ICD-10-CM | POA: Diagnosis not present

## 2022-07-14 DIAGNOSIS — I130 Hypertensive heart and chronic kidney disease with heart failure and stage 1 through stage 4 chronic kidney disease, or unspecified chronic kidney disease: Secondary | ICD-10-CM | POA: Diagnosis not present

## 2022-07-15 ENCOUNTER — Ambulatory Visit: Payer: Medicare PPO | Admitting: Cardiovascular Disease

## 2022-07-15 DIAGNOSIS — F419 Anxiety disorder, unspecified: Secondary | ICD-10-CM | POA: Diagnosis not present

## 2022-07-15 DIAGNOSIS — R7303 Prediabetes: Secondary | ICD-10-CM | POA: Diagnosis not present

## 2022-07-15 DIAGNOSIS — I480 Paroxysmal atrial fibrillation: Secondary | ICD-10-CM | POA: Diagnosis not present

## 2022-07-15 DIAGNOSIS — R600 Localized edema: Secondary | ICD-10-CM | POA: Diagnosis not present

## 2022-07-15 DIAGNOSIS — Z0000 Encounter for general adult medical examination without abnormal findings: Secondary | ICD-10-CM | POA: Diagnosis not present

## 2022-07-15 DIAGNOSIS — N1832 Chronic kidney disease, stage 3b: Secondary | ICD-10-CM | POA: Diagnosis not present

## 2022-07-15 DIAGNOSIS — G8929 Other chronic pain: Secondary | ICD-10-CM | POA: Diagnosis not present

## 2022-07-15 DIAGNOSIS — F5101 Primary insomnia: Secondary | ICD-10-CM | POA: Diagnosis not present

## 2022-07-15 DIAGNOSIS — E039 Hypothyroidism, unspecified: Secondary | ICD-10-CM | POA: Diagnosis not present

## 2022-07-18 DIAGNOSIS — I700 Atherosclerosis of aorta: Secondary | ICD-10-CM | POA: Diagnosis not present

## 2022-07-18 DIAGNOSIS — I5032 Chronic diastolic (congestive) heart failure: Secondary | ICD-10-CM | POA: Diagnosis not present

## 2022-07-18 DIAGNOSIS — N1832 Chronic kidney disease, stage 3b: Secondary | ICD-10-CM | POA: Diagnosis not present

## 2022-07-18 DIAGNOSIS — I4891 Unspecified atrial fibrillation: Secondary | ICD-10-CM | POA: Diagnosis not present

## 2022-07-18 DIAGNOSIS — I130 Hypertensive heart and chronic kidney disease with heart failure and stage 1 through stage 4 chronic kidney disease, or unspecified chronic kidney disease: Secondary | ICD-10-CM | POA: Diagnosis not present

## 2022-07-18 DIAGNOSIS — M5442 Lumbago with sciatica, left side: Secondary | ICD-10-CM | POA: Diagnosis not present

## 2022-07-18 DIAGNOSIS — G8929 Other chronic pain: Secondary | ICD-10-CM | POA: Diagnosis not present

## 2022-07-18 DIAGNOSIS — I2510 Atherosclerotic heart disease of native coronary artery without angina pectoris: Secondary | ICD-10-CM | POA: Diagnosis not present

## 2022-07-18 DIAGNOSIS — M5441 Lumbago with sciatica, right side: Secondary | ICD-10-CM | POA: Diagnosis not present

## 2022-07-23 DIAGNOSIS — I4891 Unspecified atrial fibrillation: Secondary | ICD-10-CM | POA: Diagnosis not present

## 2022-07-23 DIAGNOSIS — I5032 Chronic diastolic (congestive) heart failure: Secondary | ICD-10-CM | POA: Diagnosis not present

## 2022-07-23 DIAGNOSIS — M5442 Lumbago with sciatica, left side: Secondary | ICD-10-CM | POA: Diagnosis not present

## 2022-07-23 DIAGNOSIS — I2510 Atherosclerotic heart disease of native coronary artery without angina pectoris: Secondary | ICD-10-CM | POA: Diagnosis not present

## 2022-07-23 DIAGNOSIS — N1832 Chronic kidney disease, stage 3b: Secondary | ICD-10-CM | POA: Diagnosis not present

## 2022-07-23 DIAGNOSIS — M5441 Lumbago with sciatica, right side: Secondary | ICD-10-CM | POA: Diagnosis not present

## 2022-07-23 DIAGNOSIS — I130 Hypertensive heart and chronic kidney disease with heart failure and stage 1 through stage 4 chronic kidney disease, or unspecified chronic kidney disease: Secondary | ICD-10-CM | POA: Diagnosis not present

## 2022-07-23 DIAGNOSIS — G8929 Other chronic pain: Secondary | ICD-10-CM | POA: Diagnosis not present

## 2022-07-23 DIAGNOSIS — I700 Atherosclerosis of aorta: Secondary | ICD-10-CM | POA: Diagnosis not present

## 2022-07-24 NOTE — Progress Notes (Signed)
Remote pacemaker transmission.   

## 2022-07-31 DIAGNOSIS — G8929 Other chronic pain: Secondary | ICD-10-CM | POA: Diagnosis not present

## 2022-07-31 DIAGNOSIS — I5032 Chronic diastolic (congestive) heart failure: Secondary | ICD-10-CM | POA: Diagnosis not present

## 2022-07-31 DIAGNOSIS — I2510 Atherosclerotic heart disease of native coronary artery without angina pectoris: Secondary | ICD-10-CM | POA: Diagnosis not present

## 2022-07-31 DIAGNOSIS — I130 Hypertensive heart and chronic kidney disease with heart failure and stage 1 through stage 4 chronic kidney disease, or unspecified chronic kidney disease: Secondary | ICD-10-CM | POA: Diagnosis not present

## 2022-07-31 DIAGNOSIS — M5442 Lumbago with sciatica, left side: Secondary | ICD-10-CM | POA: Diagnosis not present

## 2022-07-31 DIAGNOSIS — I700 Atherosclerosis of aorta: Secondary | ICD-10-CM | POA: Diagnosis not present

## 2022-07-31 DIAGNOSIS — I4891 Unspecified atrial fibrillation: Secondary | ICD-10-CM | POA: Diagnosis not present

## 2022-07-31 DIAGNOSIS — N1832 Chronic kidney disease, stage 3b: Secondary | ICD-10-CM | POA: Diagnosis not present

## 2022-07-31 DIAGNOSIS — M5441 Lumbago with sciatica, right side: Secondary | ICD-10-CM | POA: Diagnosis not present

## 2022-08-01 DIAGNOSIS — I5032 Chronic diastolic (congestive) heart failure: Secondary | ICD-10-CM | POA: Diagnosis not present

## 2022-08-01 DIAGNOSIS — G8929 Other chronic pain: Secondary | ICD-10-CM | POA: Diagnosis not present

## 2022-08-01 DIAGNOSIS — I130 Hypertensive heart and chronic kidney disease with heart failure and stage 1 through stage 4 chronic kidney disease, or unspecified chronic kidney disease: Secondary | ICD-10-CM | POA: Diagnosis not present

## 2022-08-01 DIAGNOSIS — N1832 Chronic kidney disease, stage 3b: Secondary | ICD-10-CM | POA: Diagnosis not present

## 2022-08-02 DIAGNOSIS — I130 Hypertensive heart and chronic kidney disease with heart failure and stage 1 through stage 4 chronic kidney disease, or unspecified chronic kidney disease: Secondary | ICD-10-CM | POA: Diagnosis not present

## 2022-08-02 DIAGNOSIS — I2510 Atherosclerotic heart disease of native coronary artery without angina pectoris: Secondary | ICD-10-CM | POA: Diagnosis not present

## 2022-08-02 DIAGNOSIS — I700 Atherosclerosis of aorta: Secondary | ICD-10-CM | POA: Diagnosis not present

## 2022-08-02 DIAGNOSIS — M5441 Lumbago with sciatica, right side: Secondary | ICD-10-CM | POA: Diagnosis not present

## 2022-08-02 DIAGNOSIS — I4891 Unspecified atrial fibrillation: Secondary | ICD-10-CM | POA: Diagnosis not present

## 2022-08-02 DIAGNOSIS — I5032 Chronic diastolic (congestive) heart failure: Secondary | ICD-10-CM | POA: Diagnosis not present

## 2022-08-02 DIAGNOSIS — M5442 Lumbago with sciatica, left side: Secondary | ICD-10-CM | POA: Diagnosis not present

## 2022-08-02 DIAGNOSIS — N1832 Chronic kidney disease, stage 3b: Secondary | ICD-10-CM | POA: Diagnosis not present

## 2022-08-02 DIAGNOSIS — G8929 Other chronic pain: Secondary | ICD-10-CM | POA: Diagnosis not present

## 2022-08-05 DIAGNOSIS — N1832 Chronic kidney disease, stage 3b: Secondary | ICD-10-CM | POA: Diagnosis not present

## 2022-08-05 DIAGNOSIS — M5441 Lumbago with sciatica, right side: Secondary | ICD-10-CM | POA: Diagnosis not present

## 2022-08-05 DIAGNOSIS — I2510 Atherosclerotic heart disease of native coronary artery without angina pectoris: Secondary | ICD-10-CM | POA: Diagnosis not present

## 2022-08-05 DIAGNOSIS — I700 Atherosclerosis of aorta: Secondary | ICD-10-CM | POA: Diagnosis not present

## 2022-08-05 DIAGNOSIS — G8929 Other chronic pain: Secondary | ICD-10-CM | POA: Diagnosis not present

## 2022-08-05 DIAGNOSIS — M5442 Lumbago with sciatica, left side: Secondary | ICD-10-CM | POA: Diagnosis not present

## 2022-08-05 DIAGNOSIS — I130 Hypertensive heart and chronic kidney disease with heart failure and stage 1 through stage 4 chronic kidney disease, or unspecified chronic kidney disease: Secondary | ICD-10-CM | POA: Diagnosis not present

## 2022-08-05 DIAGNOSIS — I4891 Unspecified atrial fibrillation: Secondary | ICD-10-CM | POA: Diagnosis not present

## 2022-08-05 DIAGNOSIS — I5032 Chronic diastolic (congestive) heart failure: Secondary | ICD-10-CM | POA: Diagnosis not present

## 2022-08-06 DIAGNOSIS — I2510 Atherosclerotic heart disease of native coronary artery without angina pectoris: Secondary | ICD-10-CM | POA: Diagnosis not present

## 2022-08-06 DIAGNOSIS — I4891 Unspecified atrial fibrillation: Secondary | ICD-10-CM | POA: Diagnosis not present

## 2022-08-06 DIAGNOSIS — I5032 Chronic diastolic (congestive) heart failure: Secondary | ICD-10-CM | POA: Diagnosis not present

## 2022-08-06 DIAGNOSIS — G8929 Other chronic pain: Secondary | ICD-10-CM | POA: Diagnosis not present

## 2022-08-06 DIAGNOSIS — I130 Hypertensive heart and chronic kidney disease with heart failure and stage 1 through stage 4 chronic kidney disease, or unspecified chronic kidney disease: Secondary | ICD-10-CM | POA: Diagnosis not present

## 2022-08-06 DIAGNOSIS — N1832 Chronic kidney disease, stage 3b: Secondary | ICD-10-CM | POA: Diagnosis not present

## 2022-08-06 DIAGNOSIS — I700 Atherosclerosis of aorta: Secondary | ICD-10-CM | POA: Diagnosis not present

## 2022-08-06 DIAGNOSIS — M5442 Lumbago with sciatica, left side: Secondary | ICD-10-CM | POA: Diagnosis not present

## 2022-08-06 DIAGNOSIS — M5441 Lumbago with sciatica, right side: Secondary | ICD-10-CM | POA: Diagnosis not present

## 2022-08-14 DIAGNOSIS — I700 Atherosclerosis of aorta: Secondary | ICD-10-CM | POA: Diagnosis not present

## 2022-08-14 DIAGNOSIS — G8929 Other chronic pain: Secondary | ICD-10-CM | POA: Diagnosis not present

## 2022-08-14 DIAGNOSIS — I5032 Chronic diastolic (congestive) heart failure: Secondary | ICD-10-CM | POA: Diagnosis not present

## 2022-08-14 DIAGNOSIS — I4891 Unspecified atrial fibrillation: Secondary | ICD-10-CM | POA: Diagnosis not present

## 2022-08-14 DIAGNOSIS — I130 Hypertensive heart and chronic kidney disease with heart failure and stage 1 through stage 4 chronic kidney disease, or unspecified chronic kidney disease: Secondary | ICD-10-CM | POA: Diagnosis not present

## 2022-08-14 DIAGNOSIS — M5442 Lumbago with sciatica, left side: Secondary | ICD-10-CM | POA: Diagnosis not present

## 2022-08-14 DIAGNOSIS — N1832 Chronic kidney disease, stage 3b: Secondary | ICD-10-CM | POA: Diagnosis not present

## 2022-08-14 DIAGNOSIS — I2510 Atherosclerotic heart disease of native coronary artery without angina pectoris: Secondary | ICD-10-CM | POA: Diagnosis not present

## 2022-08-14 DIAGNOSIS — M5441 Lumbago with sciatica, right side: Secondary | ICD-10-CM | POA: Diagnosis not present

## 2022-08-18 ENCOUNTER — Emergency Department (HOSPITAL_COMMUNITY): Payer: Medicare PPO

## 2022-08-18 ENCOUNTER — Encounter (HOSPITAL_COMMUNITY): Payer: Self-pay

## 2022-08-18 ENCOUNTER — Emergency Department (HOSPITAL_COMMUNITY)
Admission: EM | Admit: 2022-08-18 | Discharge: 2022-08-18 | Disposition: A | Discharge status: Home / Self Care | Payer: Medicare PPO | Attending: Emergency Medicine | Admitting: Emergency Medicine

## 2022-08-18 ENCOUNTER — Other Ambulatory Visit: Payer: Self-pay

## 2022-08-18 DIAGNOSIS — I509 Heart failure, unspecified: Secondary | ICD-10-CM | POA: Insufficient documentation

## 2022-08-18 DIAGNOSIS — M545 Low back pain, unspecified: Secondary | ICD-10-CM | POA: Diagnosis not present

## 2022-08-18 DIAGNOSIS — G8929 Other chronic pain: Secondary | ICD-10-CM | POA: Diagnosis not present

## 2022-08-18 DIAGNOSIS — I517 Cardiomegaly: Secondary | ICD-10-CM | POA: Diagnosis not present

## 2022-08-18 DIAGNOSIS — M5442 Lumbago with sciatica, left side: Secondary | ICD-10-CM | POA: Diagnosis not present

## 2022-08-18 DIAGNOSIS — I10 Essential (primary) hypertension: Secondary | ICD-10-CM | POA: Diagnosis not present

## 2022-08-18 DIAGNOSIS — Z7901 Long term (current) use of anticoagulants: Secondary | ICD-10-CM | POA: Diagnosis not present

## 2022-08-18 DIAGNOSIS — R059 Cough, unspecified: Secondary | ICD-10-CM | POA: Diagnosis not present

## 2022-08-18 DIAGNOSIS — R079 Chest pain, unspecified: Secondary | ICD-10-CM | POA: Diagnosis not present

## 2022-08-18 DIAGNOSIS — K5730 Diverticulosis of large intestine without perforation or abscess without bleeding: Secondary | ICD-10-CM | POA: Diagnosis not present

## 2022-08-18 DIAGNOSIS — M1612 Unilateral primary osteoarthritis, left hip: Secondary | ICD-10-CM | POA: Diagnosis not present

## 2022-08-18 DIAGNOSIS — M48061 Spinal stenosis, lumbar region without neurogenic claudication: Secondary | ICD-10-CM | POA: Diagnosis not present

## 2022-08-18 DIAGNOSIS — R531 Weakness: Secondary | ICD-10-CM | POA: Diagnosis not present

## 2022-08-18 DIAGNOSIS — M25552 Pain in left hip: Secondary | ICD-10-CM | POA: Diagnosis not present

## 2022-08-18 LAB — I-STAT CHEM 8, ED
BUN: 15 mg/dL (ref 8–23)
Calcium, Ion: 1.4 mmol/L (ref 1.15–1.40)
Chloride: 106 mmol/L (ref 98–111)
Creatinine, Ser: 1.1 mg/dL — ABNORMAL HIGH (ref 0.44–1.00)
Glucose, Bld: 91 mg/dL (ref 70–99)
HCT: 42 % (ref 36.0–46.0)
Hemoglobin: 14.3 g/dL (ref 12.0–15.0)
Potassium: 4.2 mmol/L (ref 3.5–5.1)
Sodium: 141 mmol/L (ref 135–145)
TCO2: 28 mmol/L (ref 22–32)

## 2022-08-18 MED ORDER — CYCLOBENZAPRINE HCL 10 MG PO TABS
5.0000 mg | ORAL_TABLET | Freq: Once | ORAL | Status: AC
  Administered 2022-08-18: 5 mg via ORAL
  Filled 2022-08-18: qty 1

## 2022-08-18 MED ORDER — OXYCODONE-ACETAMINOPHEN 5-325 MG PO TABS
1.0000 | ORAL_TABLET | Freq: Once | ORAL | Status: AC
  Administered 2022-08-18: 1 via ORAL
  Filled 2022-08-18: qty 1

## 2022-08-18 MED ORDER — PREDNISONE 10 MG (21) PO TBPK: ORAL_TABLET | ORAL | 0 refills | Status: DC

## 2022-08-18 NOTE — ED Provider Notes (Signed)
[facility] AT [facility] Provider Note   CSN: [id number] Arrival date & time: 08/18/22  8295     History {Add pertinent medical, surgical, social history, OB history to HPI:1} Chief Complaint  Patient presents with   Back Pain    Stephanie Atkinson is a 89 y.o. female.  HPI 89 year old female presents today complaining of low back pain.  States she has pain that radiates down to the left buttock and all the way down to her foot.  She has had this pain off and on for several months.  She is undergoing physical therapy.  She has had no new injury.  The pain is worsened again over the past 24 hours.  She uses tramadol without relief.  She denies numbness, tingling, weakness, loss of bowel or bladder control, redness, warmth, fever, or chills.     Home Medications Prior to Admission medications   Medication Sig Start Date End Date Taking? Authorizing Provider  acetaminophen (TYLENOL) 500 MG tablet Take 1 tablet (500 mg total) by mouth every 6 (six) hours as needed for moderate pain. Patient taking differently: Take 1,000 mg by mouth every 6 (six) hours as needed for moderate pain. 06/27/19   Briant Cedar, MD  atorvastatin (LIPITOR) 80 MG tablet Take 80 mg by mouth at bedtime.     [provider]  azithromycin (ZITHROMAX) 250 MG tablet Take 1 tablet (250 mg total) by mouth daily. Take first 2 tablets together, then 1 every day until finished. 02/26/22   Pricilla Loveless, MD  carvedilol (COREG) 3.125 MG tablet Take 1 tablet (3.125 mg total) by mouth 2 (two) times daily. 04/10/22   Runell Gess, MD  cyclobenzaprine (FLEXERIL) 10 MG tablet Take 5-10 mg by mouth daily as needed for muscle spasms. 05/30/16   [provider]  diclofenac Sodium (VOLTAREN) 1 % GEL Apply 4 g topically 4 (four) times daily. 03/12/22   Palumbo, April, MD  diphenhydrAMINE (BENADRYL) 25 MG tablet Take 25 mg by mouth every 6 (six) hours as needed for allergies. Patient  not taking: Reported on 05/20/2022    [provider]  ELIQUIS 2.5 MG TABS tablet TAKE 1 TABLET BY MOUTH TWICE A DAY 05/13/22   Runell Gess, MD  empagliflozin (JARDIANCE) 10 MG TABS tablet Take 1 tablet (10 mg total) by mouth daily before breakfast. 05/27/22   Croitoru, Mihai, MD  furosemide (LASIX) 20 MG tablet Take 1 tablet (20 mg total) by mouth daily. 01/04/22   Croitoru, Mihai, MD  levothyroxine (SYNTHROID, LEVOTHROID) 50 MCG tablet Take 50 mcg by mouth daily before breakfast.     [provider]  lidocaine (LIDODERM) 5 % Place 1 patch onto the skin daily. Remove & Discard patch within 12 hours or as directed by MD 03/12/22   Nicanor Alcon, April, MD  nitroGLYCERIN (NITROSTAT) 0.4 MG SL tablet Place 1 tablet (0.4 mg total) under the tongue every 5 (five) minutes as needed for chest pain. Patient not taking: Reported on 05/20/2022 05/07/21   Runell Gess, MD  Polyethyl Glycol-Propyl Glycol (SYSTANE OP) Place 1 drop into both eyes daily as needed (dry eye).    [provider]  predniSONE (STERAPRED UNI-PAK 21 TAB) 5 MG (21) TBPK tablet Take 6,5,4,3,2,1 one tablet less each day with meals. 06/14/22   Eldred Manges, MD  sacubitril-valsartan (ENTRESTO) 49-51 MG Take 1 tablet by mouth 2 (two) times daily. 02/27/22   Croitoru, Mihai, MD  traMADol (ULTRAM) 50 MG tablet  Take 50 mg by mouth 2 (two) times daily.    [provider]  VITAMIN D3 50 MCG (2000 UT) capsule Take 1 capsule by mouth daily. 02/03/22   [provider]      Allergies    Penicillins and Sulfa antibiotics    Review of Systems   Review of Systems  Physical Exam Updated Vital Signs BP (!) 173/76   Pulse 74   Temp 98.3 F (36.8 C) (Oral)   Resp 14   SpO2 98%  Physical Exam Vitals reviewed.  Constitutional:      General: She is not in acute distress. HENT:     Head: Normocephalic.     Right Ear: External ear normal.     Left Ear: External ear normal.     Nose: Nose normal.  Eyes:      Pupils: Pupils are equal, round, and reactive to light.  Pulmonary:     Effort: Pulmonary effort is normal.  Abdominal:     Palpations: Abdomen is soft.  Musculoskeletal:     Cervical back: Normal range of motion.     Comments: Lower extremities visually examined and no obvious asymmetry or external signs of trauma Dorsal patellas pulses are palpated and are strong and equal bilaterally Posterior tibialis pulses are palpated and strong bilaterally There is no discoloration, coolness, or warmth to either of her feet and there is diffuse mild tenderness throughout the lower extremity The left lower leg was palpated without any masses cords or point tenderness Full active range of motion of the left knee Left hip was visually examined without obvious external signs of trauma and was palpated without point tenderness.  She has full active range of motion of the left hip Left buttock was visualized and palpated she has some pain at the mid left buttock without definitive point tenderness, there is no warmth, deformity, or masses noted Back was visually examined and no obvious signs of trauma.  Lumbar and sacral spine are palpated without point tenderness  Skin:    General: Skin is warm and dry.     Capillary Refill: Capillary refill takes less than 2 seconds.  Neurological:     General: No focal deficit present.     Mental Status: She is alert.  Psychiatric:        Mood and Affect: Mood normal.     ED Results / Procedures / Treatments   Labs (all labs ordered are listed, but only abnormal results are displayed) Labs Reviewed - No data to display  EKG None  Radiology No results found.  Procedures Procedures  {Document cardiac monitor, telemetry assessment procedure when appropriate:1}  Medications Ordered in ED Medications  cyclobenzaprine (FLEXERIL) tablet 5 mg (has no administration in time range)  oxyCODONE-acetaminophen (PERCOCET/ROXICET) 5-325 MG per tablet 1 tablet  (has no administration in time range)    ED Course/ Medical Decision Making/ A&P Clinical Course as of 08/18/22 1152  Sun Aug 18, 2022  1151 Chest x-Aarthi Uyeno reviewed no evidence of acute abnormality radiologist interpretation concurs [DR]    Clinical Course User Index [DR] Margarita Grizzle, MD   {   Click here for ABCD2, HEART and other calculatorsREFRESH Note before signing :1}                          Medical Decision Making Amount and/or Complexity of Data Reviewed Radiology: ordered.  Risk Prescription drug management.  89 yo female with chronic back pain now  worsened from baseline DDX includes but is not limited to: Disease and trauma of the lumbar spine  Neoplasm Bleeding Soft tissue injury and muscle strain Uti, pyelo and other urinary etiologies Retroperitoneal etiologies Skin including zoster and other acute skin issues  Patient with exacerbation of chronic back pain.  Per history and exam there is not appear to be any acute new etiology occurring.  I did image the patient with CT scan of the lumbar spine to be any acute fracture she does have chronic degenerative changes.  Patient received 1 Percocet and Flexeril and feels greatly improved. CT scans reviewed no evidence of acute fracture or acute abnormality noted Patient feels greatly improved after medications Will attempt to ambulate with assistance.  She normally walks with a walker Patient states that she cannot walk with 2 women trying to help her but feels that she could walk with a walker we will attempt to get a walker to bedside Daughter arrived at bedside.  She states that her mother has some concern about fluid.  She states that previously when she has been concerned about fluid she has some CHF. Chest x-Javaeh Muscatello, EKG, and i-STAT ordered. EKG shows a left bundle branch block consistent with prior EKGs Chest x-Natsha Guidry is clear with no effusion I-STAT is within normal limits Patient has normal oxygen saturations, heart  rate, blood pressure appears stable for discharge. {Document critical care time when appropriate:1} {Document review of labs and clinical decision tools ie heart score, Chads2Vasc2 etc:1}  {Document your independent review of radiology images, and any outside records:1} {Document your discussion with family members, caretakers, and with consultants:1} {Document social determinants of health affecting pt's care:1} {Document your decision making why or why not admission, treatments were needed:1} Final Clinical Impression(s) / ED Diagnoses Final diagnoses:  None    Rx / DC Orders ED Discharge Orders     None

## 2022-08-18 NOTE — ED Triage Notes (Signed)
BIB EMS from home.  Pt reports worsening of her sciatic pain. Pt states she was to have physical therapy today for same.

## 2022-08-18 NOTE — ED Notes (Signed)
This NT and another NT ambulated pt only about 10 steps from the bed to the door and back to pt bed. Pt told this NT they just couldn't walk any further because their hip was hurting really bad. This NT and another NT transferred pt to bedside commode to urinate and assisted pt back into bed.

## 2022-08-18 NOTE — ED Notes (Signed)
Pt has been spitting a lot more and fluid has increased. Pt family is concerned that she may have some things going on with her heart.

## 2022-08-18 NOTE — ED Notes (Addendum)
Patient eating lunch and awaiting daughter to take her home

## 2022-08-23 ENCOUNTER — Telehealth: Payer: Self-pay

## 2022-08-23 NOTE — Telephone Encounter (Signed)
Transition Care Management Unsuccessful Follow-up Telephone Call  Date of discharge and from where:  08/18/2022 [facility]  Attempts:  1st Attempt  Reason for unsuccessful TCM follow-up call:  Left voice message  Stephanie Atkinson [facility]  [facility] Community Resource Care Guide   [email or web address]  ?? [phone]   Website: [email or web address]  [email or web address]

## 2022-08-26 ENCOUNTER — Telehealth: Payer: Self-pay

## 2022-08-26 NOTE — Telephone Encounter (Signed)
Transition Care Management Unsuccessful Follow-up Telephone Call  Date of discharge and from where:  08/18/2022 [facility]  Attempts:  2nd Attempt  Reason for unsuccessful TCM follow-up call:  Left voice message  Lashondra Vaquerano [facility]  [facility] Community Resource Care Guide   [email or web address]  ?? [phone]   Website: [email or web address]  [email or web address]

## 2022-08-27 DIAGNOSIS — M5442 Lumbago with sciatica, left side: Secondary | ICD-10-CM | POA: Diagnosis not present

## 2022-08-27 DIAGNOSIS — I130 Hypertensive heart and chronic kidney disease with heart failure and stage 1 through stage 4 chronic kidney disease, or unspecified chronic kidney disease: Secondary | ICD-10-CM | POA: Diagnosis not present

## 2022-08-27 DIAGNOSIS — I700 Atherosclerosis of aorta: Secondary | ICD-10-CM | POA: Diagnosis not present

## 2022-08-27 DIAGNOSIS — N1832 Chronic kidney disease, stage 3b: Secondary | ICD-10-CM | POA: Diagnosis not present

## 2022-08-27 DIAGNOSIS — I2510 Atherosclerotic heart disease of native coronary artery without angina pectoris: Secondary | ICD-10-CM | POA: Diagnosis not present

## 2022-08-27 DIAGNOSIS — I5032 Chronic diastolic (congestive) heart failure: Secondary | ICD-10-CM | POA: Diagnosis not present

## 2022-08-27 DIAGNOSIS — M5441 Lumbago with sciatica, right side: Secondary | ICD-10-CM | POA: Diagnosis not present

## 2022-08-27 DIAGNOSIS — G8929 Other chronic pain: Secondary | ICD-10-CM | POA: Diagnosis not present

## 2022-08-27 DIAGNOSIS — I4891 Unspecified atrial fibrillation: Secondary | ICD-10-CM | POA: Diagnosis not present

## 2022-09-03 DIAGNOSIS — N1832 Chronic kidney disease, stage 3b: Secondary | ICD-10-CM | POA: Diagnosis not present

## 2022-09-03 DIAGNOSIS — G8929 Other chronic pain: Secondary | ICD-10-CM | POA: Diagnosis not present

## 2022-09-03 DIAGNOSIS — I2510 Atherosclerotic heart disease of native coronary artery without angina pectoris: Secondary | ICD-10-CM | POA: Diagnosis not present

## 2022-09-03 DIAGNOSIS — I130 Hypertensive heart and chronic kidney disease with heart failure and stage 1 through stage 4 chronic kidney disease, or unspecified chronic kidney disease: Secondary | ICD-10-CM | POA: Diagnosis not present

## 2022-09-03 DIAGNOSIS — M5441 Lumbago with sciatica, right side: Secondary | ICD-10-CM | POA: Diagnosis not present

## 2022-09-03 DIAGNOSIS — I700 Atherosclerosis of aorta: Secondary | ICD-10-CM | POA: Diagnosis not present

## 2022-09-03 DIAGNOSIS — I4891 Unspecified atrial fibrillation: Secondary | ICD-10-CM | POA: Diagnosis not present

## 2022-09-03 DIAGNOSIS — I5032 Chronic diastolic (congestive) heart failure: Secondary | ICD-10-CM | POA: Diagnosis not present

## 2022-09-03 DIAGNOSIS — M5442 Lumbago with sciatica, left side: Secondary | ICD-10-CM | POA: Diagnosis not present

## 2022-09-06 DIAGNOSIS — M48061 Spinal stenosis, lumbar region without neurogenic claudication: Secondary | ICD-10-CM | POA: Diagnosis not present

## 2022-09-06 DIAGNOSIS — M5442 Lumbago with sciatica, left side: Secondary | ICD-10-CM | POA: Diagnosis not present

## 2022-09-06 DIAGNOSIS — M5136 Other intervertebral disc degeneration, lumbar region: Secondary | ICD-10-CM | POA: Diagnosis not present

## 2022-09-10 DIAGNOSIS — M5442 Lumbago with sciatica, left side: Secondary | ICD-10-CM | POA: Diagnosis not present

## 2022-09-10 DIAGNOSIS — M5441 Lumbago with sciatica, right side: Secondary | ICD-10-CM | POA: Diagnosis not present

## 2022-09-10 DIAGNOSIS — G8929 Other chronic pain: Secondary | ICD-10-CM | POA: Diagnosis not present

## 2022-09-10 DIAGNOSIS — I2510 Atherosclerotic heart disease of native coronary artery without angina pectoris: Secondary | ICD-10-CM | POA: Diagnosis not present

## 2022-09-10 DIAGNOSIS — I130 Hypertensive heart and chronic kidney disease with heart failure and stage 1 through stage 4 chronic kidney disease, or unspecified chronic kidney disease: Secondary | ICD-10-CM | POA: Diagnosis not present

## 2022-09-10 DIAGNOSIS — I700 Atherosclerosis of aorta: Secondary | ICD-10-CM | POA: Diagnosis not present

## 2022-09-10 DIAGNOSIS — I5032 Chronic diastolic (congestive) heart failure: Secondary | ICD-10-CM | POA: Diagnosis not present

## 2022-09-10 DIAGNOSIS — N1832 Chronic kidney disease, stage 3b: Secondary | ICD-10-CM | POA: Diagnosis not present

## 2022-09-10 DIAGNOSIS — I4891 Unspecified atrial fibrillation: Secondary | ICD-10-CM | POA: Diagnosis not present

## 2022-09-24 ENCOUNTER — Ambulatory Visit: Payer: Medicare PPO | Admitting: Orthopaedic Surgery

## 2022-09-24 DIAGNOSIS — M1612 Unilateral primary osteoarthritis, left hip: Secondary | ICD-10-CM | POA: Diagnosis not present

## 2022-09-24 NOTE — Addendum Note (Signed)
Addended by: Wendi Maya on: 09/24/2022 03:54 PM   Modules accepted: Orders

## 2022-09-24 NOTE — Progress Notes (Signed)
Office Visit Note   Patient: Stephanie Atkinson           Date of Birth: October 31, 1924           MRN: [id number] Visit Date: 09/24/2022              Requested by: Linus Galas, NP [address] [place],  [place] [address] PCP: Linus Galas, NP   Assessment & Plan: Visit Diagnoses:  1. Primary osteoarthritis of left hip     Plan: Impression is 89 year old female with advanced left hip DJD with bone-on-bone joint space narrowing.  Treatment options reviewed I think at her age and activity level we should start with an intra-articular cortisone injection.  Will get this set up with Dr. Shon Baton.  Follow-Up Instructions: No follow-ups on file.   Orders:  No orders of the defined types were placed in this encounter.  No orders of the defined types were placed in this encounter.     Procedures: No procedures performed   Clinical Data: No additional findings.   Subjective: Chief Complaint  Patient presents with   Left Hip - Pain    HPI Patient is 89 year old female comes in for left hip and buttock pain.  The pain has gotten worse in the last 3 months. Review of Systems  Constitutional: Negative.   HENT: Negative.    Eyes: Negative.   Respiratory: Negative.    Cardiovascular: Negative.   Endocrine: Negative.   Musculoskeletal: Negative.   Neurological: Negative.   Hematological: Negative.   Psychiatric/Behavioral: Negative.    All other systems reviewed and are negative.    Objective: Vital Signs: There were no vitals taken for this visit.  Physical Exam Vitals and nursing note reviewed.  Constitutional:      Appearance: She is well-developed.  HENT:     Head: Normocephalic and atraumatic.  Pulmonary:     Effort: Pulmonary effort is normal.  Abdominal:     Palpations: Abdomen is soft.  Musculoskeletal:     Cervical back: Neck supple.  Skin:    General: Skin is warm.     Capillary Refill: Capillary refill takes less than 2 seconds.  Neurological:      Mental Status: She is alert and oriented to person, place, and time.  Psychiatric:        Behavior: Behavior normal.        Thought Content: Thought content normal.        Judgment: Judgment normal.     Ortho Exam Examination of the left hip shows a severe pain with any attempted range of motion of the hip joint.  She has slight trochanteric tenderness. Specialty Comments:  No specialty comments available.  Imaging: No results found.   PMFS History: Patient Active Problem List   Diagnosis Date Noted   Low back pain 06/14/2022   CHF (congestive heart failure) (HCC) 12/11/2021   Acute on chronic systolic CHF (congestive heart failure) (HCC) 12/09/2021   Venous stasis dermatitis of both lower extremities 01/06/2020   Unilateral primary osteoarthritis, right knee 01/06/2020   Community acquired bacterial pneumonia 06/23/2019   AKI (acute kidney injury) (HCC) 06/23/2019   Synovitis of right knee 03/02/2019   Chronic diastolic heart failure (HCC) 09/14/2018   Pacemaker 07/16/2017   Atrial fibrillation with slow ventricular response (HCC) 06/27/2017   Hypercholesterolemia 06/27/2017   Symptomatic bradycardia s/p PPM  06/27/2017   Chronic pain of right knee 05/17/2016   De Quervain's tenosynovitis 05/17/2016   CAD (coronary  artery disease) of artery bypass graft 02/13/2015   Encounter for loop recorder check 02/01/2014   Accelerated junctional rhythm 05/04/2013   Dehydration 10/14/2012   Syncope and collapse 10/13/2012   History of first degree atrioventricular block 10/11/2012   Long term (current) use of anticoagulants 10/06/2012   AVB - beta blocker and Amiodarone stopped 07/10/2012   Hypertension    Hx of CABG X 4 3/06-     Dyslipidemia    RCA DES placed 7/12- patent 8/14    Permanent atrial fibrillation (HCC)    Acute kidney injury superimposed on chronic kidney disease (HCC)    Past Medical History:  Diagnosis Date   Chronic kidney disease, stage 3 (HCC)     Coronary artery disease    Hx of CABG 2006   LIMA-LAD, free RIMA-PDA, Radial-OM1-OM2   Hyperlipidemia    Hypertension    Hypothyroid    MI, old        PAF (paroxysmal atrial fibrillation) (HCC)    was on amio, d/c'd due to prolonged PR interval, rate control   Presence of permanent cardiac pacemaker 07/16/2017   Presence of stent in right coronary artery 07/12   RIMA-PDA occluded, DES stent placed   Syncope 07/12   bradycardic, beta blocker decreased, also felt to be dehydrated    Family History  Problem Relation Age of Onset   CAD Mother    CAD Maternal Grandmother     Past Surgical History:  Procedure Laterality Date   CARDIAC CATHETERIZATION  [phone]   dr. Onalee Hua harding, revealing a atretic bypass to her right with patent distal RCA stent   CAROTID STENT  2012   CORONARY ARTERY BYPASS GRAFT  2006   LIMA-LAD, free RIMA-PDA, Radial-OM1-OM2   DOPPLER ECHOCARDIOGRAPHY  [id number]   mild asymmetric left ventricular hypertrophy, left ventricular systolic function is low normal, ejection fraction = 50-55%, the LA is moderate dilated, the RA is mildly dilated, no significant valvular disease   INSERT / REPLACE / REMOVE PACEMAKER  07/16/2017   JOINT REPLACEMENT     hip replacement   LEFT HEART CATHETERIZATION WITH CORONARY/GRAFT ANGIOGRAM N/A 10/12/2012   Procedure: LEFT HEART CATHETERIZATION WITH Isabel Caprice;  Surgeon: Marykay Lex, MD;  Location: [facility] CATH LAB;  Service: Cardiovascular;  Laterality: N/A;   LOOP RECORDER IMPLANT N/A 10/15/2012   Procedure: LOOP RECORDER IMPLANT;  Surgeon: Thurmon Fair, MD;  Location: [facility] CATH LAB;  Service: Cardiovascular;  Laterality: N/A;   LOOP RECORDER REMOVAL  07/16/2017   LOOP RECORDER REMOVAL N/A 07/16/2017   Procedure: LOOP RECORDER REMOVAL;  Surgeon: Thurmon Fair, MD;  Location: [facility] INVASIVE CV LAB;  Service: Cardiovascular;  Laterality: N/A;   NM MYOVIEW LTD  100510   post stress left ventricle is normal in size, post stress  ejection fraction is 67% global left ventricular systolic function is normal, normal myocardial perfusion study, abnormal myocardial perfusion study, low risk scan   PACEMAKER IMPLANT N/A 07/16/2017   Procedure: PACEMAKER IMPLANT;  Surgeon: Thurmon Fair, MD;  Location: [facility] INVASIVE CV LAB;  Service: Cardiovascular;  Laterality: N/A;   THYROIDECTOMY     Social History   Occupational History   Not on file  Tobacco Use   Smoking status: Never   Smokeless tobacco: Current    Types: Snuff  Vaping Use   Vaping status: Never Used  Substance and Sexual Activity   Alcohol use: No   Drug use: No   Sexual activity: Not on file

## 2022-09-25 ENCOUNTER — Other Ambulatory Visit: Payer: Self-pay | Admitting: Cardiovascular Disease

## 2022-09-25 NOTE — Telephone Encounter (Signed)
Ok to keep on lower 2.5mg  dose, she's very elderly and her weight is close to dose adjustment criteria.

## 2022-09-25 NOTE — Telephone Encounter (Signed)
Prescription refill request for Eliquis received. Indication: Afib  Last office visit: 05/20/22 ([facility])  Scr: 1.10 (08/18/22)  Age: 89 Weight: 64.1kg  Per office visit on 05/20/22 "Anticoagulation: Eliquis dose adjusted for age and small body size.  Renal function has improved."   Per dosing criteria, dose not appropriate. Will forward to pharmacy for review.

## 2022-09-26 ENCOUNTER — Encounter: Payer: Medicare PPO | Admitting: Cardiovascular Disease

## 2022-09-26 ENCOUNTER — Other Ambulatory Visit: Payer: Self-pay

## 2022-09-26 ENCOUNTER — Encounter: Payer: Self-pay | Admitting: Sports Medicine

## 2022-09-26 ENCOUNTER — Ambulatory Visit: Payer: Medicare PPO | Admitting: Sports Medicine

## 2022-09-26 DIAGNOSIS — M1612 Unilateral primary osteoarthritis, left hip: Secondary | ICD-10-CM | POA: Diagnosis not present

## 2022-09-26 MED ORDER — METHYLPREDNISOLONE ACETATE 40 MG/ML IJ SUSP
40.0000 mg | INTRAMUSCULAR | Status: AC | PRN
Start: 2022-09-26 — End: 2022-09-26
  Administered 2022-09-26: 40 mg via INTRA_ARTICULAR

## 2022-09-26 MED ORDER — LIDOCAINE HCL 1 % IJ SOLN
4.0000 mL | INTRAMUSCULAR | Status: AC | PRN
Start: 2022-09-26 — End: 2022-09-26
  Administered 2022-09-26: 4 mL

## 2022-09-26 NOTE — Telephone Encounter (Signed)
Refill sent to pharmacy.   

## 2022-09-26 NOTE — Progress Notes (Signed)
   Procedure Note  Patient: Stephanie Atkinson             Date of Birth: 1924/06/07           MRN: [id number]             Visit Date: 09/26/2022  Procedures: Visit Diagnoses:  1. Primary osteoarthritis of left hip    Large Joint Inj: L hip joint on 09/26/2022 10:13 AM Indications: pain Details: 22 G 3.5 in needle, ultrasound-guided anterior approach Medications: 4 mL lidocaine 1 %; 40 mg methylPREDNISolone acetate 40 MG/ML Outcome: tolerated well, no immediate complications  Procedure: US-guided intra-articular hip injection, left After discussion on risks/benefits/indications and informed verbal consent was obtained, a timeout was performed. Patient was lying supine on exam table. The hip was cleaned with betadine and alcohol swabs. Then utilizing ultrasound guidance, the patient's femoral head and neck junction was identified and subsequently injected with 4:1 lidocaine:depomedrol via an in-plane approach with ultrasound visualization of the injectate administered into the hip joint. Patient tolerated procedure well without immediate complications.  Procedure, treatment alternatives, risks and benefits explained, specific risks discussed. Consent was given by the patient. Immediately prior to procedure a time out was called to verify the correct patient, procedure, equipment, support staff and site/side marked as required. Patient was prepped and draped in the usual sterile fashion.     - I evaluated the patient about 10 minutes post-injection and she had good improvement in pain and range of motion - "felt numb with no pain" - follow-up with Dr. Roda Shutters as indicated; I am happy to see them as needed  Madelyn Brunner, DO Primary Care Sports Medicine Physician  [facility] - Orthopedics  This note was dictated using Dragon naturally speaking software and may contain errors in syntax, spelling, or content which have not been identified prior to signing this note.

## 2022-09-26 NOTE — Progress Notes (Signed)
Left Hip injection per Dr. Roda Shutters

## 2022-10-07 ENCOUNTER — Other Ambulatory Visit: Payer: Self-pay

## 2022-10-07 ENCOUNTER — Telehealth: Payer: Self-pay | Admitting: Sports Medicine

## 2022-10-07 ENCOUNTER — Other Ambulatory Visit: Payer: Self-pay | Admitting: Physician Assistant

## 2022-10-07 DIAGNOSIS — M1612 Unilateral primary osteoarthritis, left hip: Secondary | ICD-10-CM

## 2022-10-07 MED ORDER — PREDNISONE 10 MG (21) PO TBPK: ORAL_TABLET | ORAL | 0 refills | Status: DC

## 2022-10-07 NOTE — Telephone Encounter (Signed)
Notified Janace Aris

## 2022-10-07 NOTE — Telephone Encounter (Signed)
Patient's request [facility]nisone [facility] [address]    And would a referral for Home health physical therapy

## 2022-10-07 NOTE — Telephone Encounter (Signed)
I sent in prednisone.  I don't think PT will help her hip, but ok if you want to try

## 2022-10-08 ENCOUNTER — Telehealth: Payer: Self-pay | Admitting: Orthopaedic Surgery

## 2022-10-08 DIAGNOSIS — M25552 Pain in left hip: Secondary | ICD-10-CM | POA: Diagnosis not present

## 2022-10-08 DIAGNOSIS — I5032 Chronic diastolic (congestive) heart failure: Secondary | ICD-10-CM | POA: Diagnosis not present

## 2022-10-08 DIAGNOSIS — E7800 Pure hypercholesterolemia, unspecified: Secondary | ICD-10-CM | POA: Diagnosis not present

## 2022-10-08 DIAGNOSIS — I4891 Unspecified atrial fibrillation: Secondary | ICD-10-CM | POA: Diagnosis not present

## 2022-10-08 DIAGNOSIS — Z7901 Long term (current) use of anticoagulants: Secondary | ICD-10-CM | POA: Diagnosis not present

## 2022-10-08 DIAGNOSIS — I130 Hypertensive heart and chronic kidney disease with heart failure and stage 1 through stage 4 chronic kidney disease, or unspecified chronic kidney disease: Secondary | ICD-10-CM | POA: Diagnosis not present

## 2022-10-08 DIAGNOSIS — N183 Chronic kidney disease, stage 3 unspecified: Secondary | ICD-10-CM | POA: Diagnosis not present

## 2022-10-08 DIAGNOSIS — M1612 Unilateral primary osteoarthritis, left hip: Secondary | ICD-10-CM | POA: Diagnosis not present

## 2022-10-08 DIAGNOSIS — I2510 Atherosclerotic heart disease of native coronary artery without angina pectoris: Secondary | ICD-10-CM | POA: Diagnosis not present

## 2022-10-08 NOTE — Telephone Encounter (Signed)
Gretchen called. She is the PT working in home with patient. She would like verbal orders 2wk 2x, 1wk 4x. Her call back number is [phone]

## 2022-10-08 NOTE — Telephone Encounter (Signed)
Gave verbal to [employer]

## 2022-10-11 ENCOUNTER — Ambulatory Visit (INDEPENDENT_AMBULATORY_CARE_PROVIDER_SITE_OTHER): Payer: Medicare PPO

## 2022-10-11 DIAGNOSIS — I5032 Chronic diastolic (congestive) heart failure: Secondary | ICD-10-CM | POA: Diagnosis not present

## 2022-10-11 DIAGNOSIS — I2510 Atherosclerotic heart disease of native coronary artery without angina pectoris: Secondary | ICD-10-CM | POA: Diagnosis not present

## 2022-10-11 DIAGNOSIS — I5042 Chronic combined systolic (congestive) and diastolic (congestive) heart failure: Secondary | ICD-10-CM | POA: Diagnosis not present

## 2022-10-11 DIAGNOSIS — I130 Hypertensive heart and chronic kidney disease with heart failure and stage 1 through stage 4 chronic kidney disease, or unspecified chronic kidney disease: Secondary | ICD-10-CM | POA: Diagnosis not present

## 2022-10-11 DIAGNOSIS — M1612 Unilateral primary osteoarthritis, left hip: Secondary | ICD-10-CM | POA: Diagnosis not present

## 2022-10-11 DIAGNOSIS — Z7901 Long term (current) use of anticoagulants: Secondary | ICD-10-CM | POA: Diagnosis not present

## 2022-10-11 DIAGNOSIS — E7800 Pure hypercholesterolemia, unspecified: Secondary | ICD-10-CM | POA: Diagnosis not present

## 2022-10-11 DIAGNOSIS — M25552 Pain in left hip: Secondary | ICD-10-CM | POA: Diagnosis not present

## 2022-10-11 DIAGNOSIS — I4891 Unspecified atrial fibrillation: Secondary | ICD-10-CM | POA: Diagnosis not present

## 2022-10-11 DIAGNOSIS — N183 Chronic kidney disease, stage 3 unspecified: Secondary | ICD-10-CM | POA: Diagnosis not present

## 2022-10-11 LAB — CUP PACEART REMOTE DEVICE CHECK
Battery Remaining Longevity: 79 mo
Battery Voltage: 2.97 V
Brady Statistic RV Percent Paced: 99.47 %
Date Time Interrogation Session: 20240816004400
Implantable Lead Connection Status: 753985
Implantable Lead Implant Date: 20190522
Implantable Lead Location: 753860
Implantable Lead Model: 5076
Implantable Pulse Generator Implant Date: 20190522
Lead Channel Impedance Value: 323 Ohm
Lead Channel Impedance Value: 437 Ohm
Lead Channel Pacing Threshold Amplitude: 0.75 V
Lead Channel Pacing Threshold Pulse Width: 0.4 ms
Lead Channel Sensing Intrinsic Amplitude: 9.75 mV
Lead Channel Sensing Intrinsic Amplitude: 9.75 mV
Lead Channel Setting Pacing Amplitude: 2.5 V
Lead Channel Setting Pacing Pulse Width: 0.4 ms
Lead Channel Setting Sensing Sensitivity: 1.2 mV
Zone Setting Status: 755011

## 2022-10-15 ENCOUNTER — Telehealth: Payer: Self-pay | Admitting: Sports Medicine

## 2022-10-15 MED ORDER — TRAMADOL HCL 50 MG PO TABS: 50.0000 mg | ORAL_TABLET | Freq: Two times a day (BID) | ORAL | 0 refills | Status: DC

## 2022-10-15 NOTE — Telephone Encounter (Signed)
Patient's daughter called. She would like some pain medication called in for her. Her cb# [phone]

## 2022-10-15 NOTE — Telephone Encounter (Signed)
Sent tramadol

## 2022-10-16 ENCOUNTER — Telehealth: Payer: Self-pay | Admitting: Orthopaedic Surgery

## 2022-10-16 DIAGNOSIS — I130 Hypertensive heart and chronic kidney disease with heart failure and stage 1 through stage 4 chronic kidney disease, or unspecified chronic kidney disease: Secondary | ICD-10-CM | POA: Diagnosis not present

## 2022-10-16 DIAGNOSIS — M25552 Pain in left hip: Secondary | ICD-10-CM | POA: Diagnosis not present

## 2022-10-16 DIAGNOSIS — N183 Chronic kidney disease, stage 3 unspecified: Secondary | ICD-10-CM | POA: Diagnosis not present

## 2022-10-16 DIAGNOSIS — Z7901 Long term (current) use of anticoagulants: Secondary | ICD-10-CM | POA: Diagnosis not present

## 2022-10-16 DIAGNOSIS — E7800 Pure hypercholesterolemia, unspecified: Secondary | ICD-10-CM | POA: Diagnosis not present

## 2022-10-16 DIAGNOSIS — M1612 Unilateral primary osteoarthritis, left hip: Secondary | ICD-10-CM | POA: Diagnosis not present

## 2022-10-16 DIAGNOSIS — I4891 Unspecified atrial fibrillation: Secondary | ICD-10-CM | POA: Diagnosis not present

## 2022-10-16 DIAGNOSIS — I2510 Atherosclerotic heart disease of native coronary artery without angina pectoris: Secondary | ICD-10-CM | POA: Diagnosis not present

## 2022-10-16 DIAGNOSIS — I5032 Chronic diastolic (congestive) heart failure: Secondary | ICD-10-CM | POA: Diagnosis not present

## 2022-10-16 MED ORDER — HYDROCODONE-ACETAMINOPHEN 5-325 MG PO TABS: 1.0000 | ORAL_TABLET | Freq: Four times a day (QID) | ORAL | 0 refills | Status: DC | PRN

## 2022-10-16 NOTE — Telephone Encounter (Signed)
Patient daughter called back and wanted to give you the [facility] pharmacy on [address]. [facility] but they [facility]'t have at any of they stores. [phone]

## 2022-10-16 NOTE — Telephone Encounter (Signed)
Spoke with patient's daughter. [facility] [address] [facility] of Norco. I ask that she contact another pharmacy to see if she can find it in stock, and if so let us know where to send it.

## 2022-10-16 NOTE — Telephone Encounter (Signed)
Pt's daughter [name] called stating that pt already takes tramadol from another provider and she has taken what she already has. [name] states tramadol is not touching pt's pain. Please call pt daughter [name] at [phone]. Asking for a different pain medication

## 2022-10-16 NOTE — Addendum Note (Signed)
Addended by: Mayra Reel on: 10/16/2022 09:17 PM   Modules accepted: Orders

## 2022-10-16 NOTE — Telephone Encounter (Signed)
Sent   norco

## 2022-10-16 NOTE — Telephone Encounter (Signed)
Tramadol was sent in yesterday. Please advise.

## 2022-10-16 NOTE — Telephone Encounter (Signed)
done

## 2022-10-17 ENCOUNTER — Telehealth: Payer: Self-pay | Admitting: Orthopaedic Surgery

## 2022-10-17 NOTE — Telephone Encounter (Signed)
Called and left a vm on Pt's daughter [name]  for her to call and see she pt would like to be seen by PA Persons tomorrow afternoon. [facility] clinic will not be open tomorrow.

## 2022-10-18 ENCOUNTER — Encounter: Payer: Self-pay | Admitting: Physician Assistant

## 2022-10-18 ENCOUNTER — Ambulatory Visit: Payer: Medicare PPO | Admitting: Physician Assistant

## 2022-10-18 DIAGNOSIS — M1612 Unilateral primary osteoarthritis, left hip: Secondary | ICD-10-CM | POA: Diagnosis not present

## 2022-10-18 DIAGNOSIS — Z7901 Long term (current) use of anticoagulants: Secondary | ICD-10-CM | POA: Diagnosis not present

## 2022-10-18 DIAGNOSIS — M5440 Lumbago with sciatica, unspecified side: Secondary | ICD-10-CM | POA: Diagnosis not present

## 2022-10-18 DIAGNOSIS — I2510 Atherosclerotic heart disease of native coronary artery without angina pectoris: Secondary | ICD-10-CM | POA: Diagnosis not present

## 2022-10-18 DIAGNOSIS — E7800 Pure hypercholesterolemia, unspecified: Secondary | ICD-10-CM | POA: Diagnosis not present

## 2022-10-18 DIAGNOSIS — I5032 Chronic diastolic (congestive) heart failure: Secondary | ICD-10-CM | POA: Diagnosis not present

## 2022-10-18 DIAGNOSIS — I4891 Unspecified atrial fibrillation: Secondary | ICD-10-CM | POA: Diagnosis not present

## 2022-10-18 DIAGNOSIS — N183 Chronic kidney disease, stage 3 unspecified: Secondary | ICD-10-CM | POA: Diagnosis not present

## 2022-10-18 DIAGNOSIS — M25552 Pain in left hip: Secondary | ICD-10-CM | POA: Diagnosis not present

## 2022-10-18 DIAGNOSIS — I130 Hypertensive heart and chronic kidney disease with heart failure and stage 1 through stage 4 chronic kidney disease, or unspecified chronic kidney disease: Secondary | ICD-10-CM | POA: Diagnosis not present

## 2022-10-18 NOTE — Progress Notes (Signed)
Office Visit Note   Patient: Stephanie Atkinson           Date of Birth: [birth year]           MRN: [id number] Visit Date: 10/18/2022              Requested by: Linus Galas, NP [address] [place],  [place] [address] PCP: Linus Galas, NP  Chief Complaint  Patient presents with   Lower Back - Pain      HPI: "Is a pleasant 89 year old woman who is accompanied by her daughter [name].  She has had her ongoing issues with her left hip and her low back.  She has been seen in the emergency room and seen by myself previously.  At her last visit with me I did recommend consideration of referral to Dr. Alvester Morin for possible epidural steroid injections.  She has recently gotten a hip injection with Dr. Shon Baton but does not seem to think that it helped all that much.  The pain she describes today goes down her left hip into her buttocks down her left thigh.  She does get some associated paresthesias but not really much weakness.  She ambulates with a walker.  She is currently taking tramadol and hydrocodone in between that was prescribed to her by Dr. Shon Baton.  She is also doing physical therapy.  Describes her pain is moderate  Assessment & Plan: Visit Diagnoses: Low back pain and stenosis  Plan: I had a long conversation with her and her daughter.  I do think she may benefit from a lumbar corset and did write a prescription for such.  I also again offered to refer her to Dr. Alvester Morin for possible epidural steroid injections.  She does have a pacemaker cannot have an MRI but she has had a recent CAT scan of her lumbar spine which demonstrated paracentral herniation which may be affecting the L3 nerve root and stenosis at L4-5.[name]    Follow-Up Instructions: With Dr. Alvester Morin for possible epidural steroid injections  Ortho Exam  Patient is alert, oriented, no adenopathy, well-dressed, normal affect, normal respiratory effort. Examination of her lower back no redness no fluctuance no deformity.  She  does have some tenderness to deep palpation over the left lower buttock.  Her strength is at her baseline.  She does ambulate with a rollator walker.  She has good dorsiflexion plantarflexion of her ankles.  No real significant pain in the groin or with manipulation.  Imaging: No results found. No images are attached to the encounter.  Labs: Lab Results  Component Value Date   REPTSTATUS 03/14/2022 FINAL 03/11/2022   CULT 20,000 COLONIES/mL ENTEROCOCCUS FAECALIS (A) 03/11/2022   LABORGA ENTEROCOCCUS FAECALIS (A) 03/11/2022     Lab Results  Component Value Date   ALBUMIN 3.6 11/30/2020   ALBUMIN 3.9 06/19/2020   ALBUMIN 3.7 06/22/2019    Lab Results  Component Value Date   MG 2.0 12/10/2021   MG 2.2 12/11/2010   No results found for: "VD25OH"  No results found for: "PREALBUMIN"    Latest Ref Rng & Units 08/18/2022   11:38 AM 03/12/2022   12:13 AM 02/26/2022    1:25 PM  CBC EXTENDED  WBC 4.0 - 10.5 K/uL  4.0  4.0   RBC 3.87 - 5.11 MIL/uL  4.98  5.00   Hemoglobin 12.0 - 15.0 g/dL 62.1  30.8  65.7   HCT 36.0 - 46.0 % 42.0  43.2  45.1   Platelets  150 - 400 K/uL  194  170   NEUT# 1.7 - 7.7 K/uL  2.2  2.6   Lymph# 0.7 - 4.0 K/uL  1.2  1.0      There is no height or weight on file to calculate BMI.  Orders:  No orders of the defined types were placed in this encounter.  No orders of the defined types were placed in this encounter.    Procedures: No procedures performed  Clinical Data: No additional findings.  ROS:  All other systems negative, except as noted in the HPI. Review of Systems  Objective: Vital Signs: There were no vitals taken for this visit.  Specialty Comments:  No specialty comments available.  PMFS History: Patient Active Problem List   Diagnosis Date Noted   Low back pain 06/14/2022   CHF (congestive heart failure) (HCC) 12/11/2021   Acute on chronic systolic CHF (congestive heart failure) (HCC) 12/09/2021   Venous stasis dermatitis of  both lower extremities 01/06/2020   Unilateral primary osteoarthritis, right knee 01/06/2020   Community acquired bacterial pneumonia 06/23/2019   AKI (acute kidney injury) (HCC) 06/23/2019   Synovitis of right knee 03/02/2019   Chronic diastolic heart failure (HCC) 09/14/2018   Pacemaker 07/16/2017   Atrial fibrillation with slow ventricular response (HCC) 06/27/2017   Hypercholesterolemia 06/27/2017   Symptomatic bradycardia s/p PPM  06/27/2017   Chronic pain of right knee 05/17/2016   De Quervain's tenosynovitis 05/17/2016   CAD (coronary artery disease) of artery bypass graft 02/13/2015   Encounter for loop recorder check 02/01/2014   Accelerated junctional rhythm 05/04/2013   Dehydration 10/14/2012   Syncope and collapse 10/13/2012   History of first degree atrioventricular block 10/11/2012   Long term (current) use of anticoagulants 10/06/2012   AVB - beta blocker and Amiodarone stopped 07/10/2012   Hypertension    Hx of CABG X 4 3/06-     Dyslipidemia    RCA DES placed 7/12- patent 8/14    Permanent atrial fibrillation (HCC)    Acute kidney injury superimposed on chronic kidney disease (HCC)    Past Medical History:  Diagnosis Date   Chronic kidney disease, stage 3 (HCC)    Coronary artery disease    Hx of CABG 2006   LIMA-LAD, free RIMA-PDA, Radial-OM1-OM2   Hyperlipidemia    Hypertension    Hypothyroid    MI, old        PAF (paroxysmal atrial fibrillation) (HCC)    was on amio, d/c'd due to prolonged PR interval, rate control   Presence of permanent cardiac pacemaker 07/16/2017   Presence of stent in right coronary artery 07/12   RIMA-PDA occluded, DES stent placed   Syncope 07/12   bradycardic, beta blocker decreased, also felt to be dehydrated    Family History  Problem Relation Age of Onset   CAD Mother    CAD Maternal Grandmother     Past Surgical History:  Procedure Laterality Date   CARDIAC CATHETERIZATION  [phone]   dr. Onalee Hua harding, revealing a  atretic bypass to her right with patent distal RCA stent   CAROTID STENT  2012   CORONARY ARTERY BYPASS GRAFT  2006   LIMA-LAD, free RIMA-PDA, Radial-OM1-OM2   DOPPLER ECHOCARDIOGRAPHY  [id number]   mild asymmetric left ventricular hypertrophy, left ventricular systolic function is low normal, ejection fraction = 50-55%, the LA is moderate dilated, the RA is mildly dilated, no significant valvular disease   INSERT / REPLACE / REMOVE PACEMAKER  07/16/2017  JOINT REPLACEMENT     hip replacement   LEFT HEART CATHETERIZATION WITH CORONARY/GRAFT ANGIOGRAM N/A 10/12/2012   Procedure: LEFT HEART CATHETERIZATION WITH Isabel Caprice;  Surgeon: Marykay Lex, MD;  Location: [facility] CATH LAB;  Service: Cardiovascular;  Laterality: N/A;   LOOP RECORDER IMPLANT N/A 10/15/2012   Procedure: LOOP RECORDER IMPLANT;  Surgeon: Thurmon Fair, MD;  Location: [facility] CATH LAB;  Service: Cardiovascular;  Laterality: N/A;   LOOP RECORDER REMOVAL  07/16/2017   LOOP RECORDER REMOVAL N/A 07/16/2017   Procedure: LOOP RECORDER REMOVAL;  Surgeon: Thurmon Fair, MD;  Location: [facility] INVASIVE CV LAB;  Service: Cardiovascular;  Laterality: N/A;   NM MYOVIEW LTD  100510   post stress left ventricle is normal in size, post stress ejection fraction is 67% global left ventricular systolic function is normal, normal myocardial perfusion study, abnormal myocardial perfusion study, low risk scan   PACEMAKER IMPLANT N/A 07/16/2017   Procedure: PACEMAKER IMPLANT;  Surgeon: Thurmon Fair, MD;  Location: [facility] INVASIVE CV LAB;  Service: Cardiovascular;  Laterality: N/A;   THYROIDECTOMY     Social History   Occupational History   Not on file  Tobacco Use   Smoking status: Never   Smokeless tobacco: Current    Types: Snuff  Vaping Use   Vaping status: Never Used  Substance and Sexual Activity   Alcohol use: No   Drug use: No   Sexual activity: Not on file

## 2022-10-21 NOTE — Progress Notes (Signed)
Remote pacemaker transmission.   

## 2022-10-22 ENCOUNTER — Ambulatory Visit: Payer: Medicare PPO | Admitting: Physician Assistant

## 2022-10-22 DIAGNOSIS — I5032 Chronic diastolic (congestive) heart failure: Secondary | ICD-10-CM | POA: Diagnosis not present

## 2022-10-22 DIAGNOSIS — M1612 Unilateral primary osteoarthritis, left hip: Secondary | ICD-10-CM | POA: Diagnosis not present

## 2022-10-22 DIAGNOSIS — E7800 Pure hypercholesterolemia, unspecified: Secondary | ICD-10-CM | POA: Diagnosis not present

## 2022-10-22 DIAGNOSIS — I2510 Atherosclerotic heart disease of native coronary artery without angina pectoris: Secondary | ICD-10-CM | POA: Diagnosis not present

## 2022-10-22 DIAGNOSIS — I130 Hypertensive heart and chronic kidney disease with heart failure and stage 1 through stage 4 chronic kidney disease, or unspecified chronic kidney disease: Secondary | ICD-10-CM | POA: Diagnosis not present

## 2022-10-22 DIAGNOSIS — I4891 Unspecified atrial fibrillation: Secondary | ICD-10-CM | POA: Diagnosis not present

## 2022-10-22 DIAGNOSIS — Z7901 Long term (current) use of anticoagulants: Secondary | ICD-10-CM | POA: Diagnosis not present

## 2022-10-22 DIAGNOSIS — N183 Chronic kidney disease, stage 3 unspecified: Secondary | ICD-10-CM | POA: Diagnosis not present

## 2022-10-22 DIAGNOSIS — M25552 Pain in left hip: Secondary | ICD-10-CM | POA: Diagnosis not present

## 2022-10-24 ENCOUNTER — Ambulatory Visit (INDEPENDENT_AMBULATORY_CARE_PROVIDER_SITE_OTHER): Payer: Medicare PPO | Admitting: Student

## 2022-10-24 ENCOUNTER — Encounter (HOSPITAL_BASED_OUTPATIENT_CLINIC_OR_DEPARTMENT_OTHER): Payer: Self-pay | Admitting: Student

## 2022-10-24 DIAGNOSIS — M5440 Lumbago with sciatica, unspecified side: Secondary | ICD-10-CM

## 2022-10-24 DIAGNOSIS — M1712 Unilateral primary osteoarthritis, left knee: Secondary | ICD-10-CM | POA: Diagnosis not present

## 2022-10-25 ENCOUNTER — Encounter: Payer: Self-pay | Admitting: Physical Medicine and Rehabilitation

## 2022-10-25 ENCOUNTER — Ambulatory Visit: Payer: Medicare PPO | Admitting: Physical Medicine and Rehabilitation

## 2022-10-25 DIAGNOSIS — R269 Unspecified abnormalities of gait and mobility: Secondary | ICD-10-CM

## 2022-10-25 DIAGNOSIS — M5416 Radiculopathy, lumbar region: Secondary | ICD-10-CM

## 2022-10-25 DIAGNOSIS — M5440 Lumbago with sciatica, unspecified side: Secondary | ICD-10-CM | POA: Diagnosis not present

## 2022-10-25 DIAGNOSIS — M48062 Spinal stenosis, lumbar region with neurogenic claudication: Secondary | ICD-10-CM

## 2022-10-25 DIAGNOSIS — M1712 Unilateral primary osteoarthritis, left knee: Secondary | ICD-10-CM

## 2022-10-25 MED ORDER — TRIAMCINOLONE ACETONIDE 40 MG/ML IJ SUSP
2.0000 mL | INTRAMUSCULAR | Status: AC | PRN
Start: 2022-10-25 — End: 2022-10-25
  Administered 2022-10-25: 2 mL via INTRA_ARTICULAR

## 2022-10-25 MED ORDER — LIDOCAINE HCL 1 % IJ SOLN
4.0000 mL | INTRAMUSCULAR | Status: AC | PRN
Start: 2022-10-25 — End: 2022-10-25
  Administered 2022-10-25: 4 mL

## 2022-10-25 NOTE — Progress Notes (Unsigned)
Functional Pain Scale - descriptive words and definitions  Uncomfortable (3)  Pain is present but can complete all ADL's/sleep is slightly affected and passive distraction only gives marginal relief. Mild range order  Average Pain 3  Lower back pain on left side that can radiate to the right side and into the left leg

## 2022-10-25 NOTE — Progress Notes (Signed)
Chief Complaint: Left hip and right knee pain     History of Present Illness:    Stephanie Atkinson is a 89 y.o. female presenting today with pain in her left hip and left knee.  She has had ongoing chronic issues with her low back and left hip and has recently been seen a few times for evaluation and treatment.  She did undergo a left hip cortisone injection with Dr. Shon Baton on 09/27/2022 but only reports about 2 days of relief.  She was seen by West Bali Persons, PA-C last week for similar left hip and low back pain and was referred to Dr. Alvester Morin, however they have not yet been contacted.  Patient states that her pain has been worsening over the last 3 days, and now her knee has also become much more bothersome.  Her knee has become moderately painful, swollen, and reports that it occasionally feels feels like giving out.  Her daughter is concerned about her general recent decline in mobility.  She is also having pain in the left-sided low back and gluteal area that radiates down the leg often to the foot.  She has been taking Tylenol arthritis, 2 tramadol daily, and occasional hydrocodone.   Surgical History:   Right THA 2019  PMH/PSH/Family History/Social History/Meds/Allergies:    Past Medical History:  Diagnosis Date   Chronic kidney disease, stage 3 (HCC)    Coronary artery disease    Hx of CABG 2006   LIMA-LAD, free RIMA-PDA, Radial-OM1-OM2   Hyperlipidemia    Hypertension    Hypothyroid    MI, old        PAF (paroxysmal atrial fibrillation) (HCC)    was on amio, d/c'd due to prolonged PR interval, rate control   Presence of permanent cardiac pacemaker 07/16/2017   Presence of stent in right coronary artery 07/12   RIMA-PDA occluded, DES stent placed   Syncope 07/12   bradycardic, beta blocker decreased, also felt to be dehydrated   Past Surgical History:  Procedure Laterality Date   CARDIAC CATHETERIZATION  101512   dr. Onalee Hua harding,  revealing a atretic bypass to her right with patent distal RCA stent   CAROTID STENT  2012   CORONARY ARTERY BYPASS GRAFT  2006   LIMA-LAD, free RIMA-PDA, Radial-OM1-OM2   DOPPLER ECHOCARDIOGRAPHY  [id number]   mild asymmetric left ventricular hypertrophy, left ventricular systolic function is low normal, ejection fraction = 50-55%, the LA is moderate dilated, the RA is mildly dilated, no significant valvular disease   INSERT / REPLACE / REMOVE PACEMAKER  07/16/2017   JOINT REPLACEMENT     hip replacement   LEFT HEART CATHETERIZATION WITH CORONARY/GRAFT ANGIOGRAM N/A 10/12/2012   Procedure: LEFT HEART CATHETERIZATION WITH Isabel Caprice;  Surgeon: Marykay Lex, MD;  Location: [facility] CATH LAB;  Service: Cardiovascular;  Laterality: N/A;   LOOP RECORDER IMPLANT N/A 10/15/2012   Procedure: LOOP RECORDER IMPLANT;  Surgeon: Thurmon Fair, MD;  Location: [facility] CATH LAB;  Service: Cardiovascular;  Laterality: N/A;   LOOP RECORDER REMOVAL  07/16/2017   LOOP RECORDER REMOVAL N/A 07/16/2017   Procedure: LOOP RECORDER REMOVAL;  Surgeon: Thurmon Fair, MD;  Location: [facility] INVASIVE CV LAB;  Service: Cardiovascular;  Laterality: N/A;   NM MYOVIEW LTD  100510   post stress left ventricle is normal in size,  post stress ejection fraction is 67% global left ventricular systolic function is normal, normal myocardial perfusion study, abnormal myocardial perfusion study, low risk scan   PACEMAKER IMPLANT N/A 07/16/2017   Procedure: PACEMAKER IMPLANT;  Surgeon: Thurmon Fair, MD;  Location: [facility] INVASIVE CV LAB;  Service: Cardiovascular;  Laterality: N/A;   THYROIDECTOMY     Social History   Socioeconomic History   Marital status: Single    Spouse name: Not on file   Number of children: Not on file   Years of education: Not on file   Highest education level: Not on file  Occupational History   Not on file  Tobacco Use   Smoking status: Never   Smokeless tobacco: Current    Types: Snuff  Vaping Use    Vaping status: Never Used  Substance and Sexual Activity   Alcohol use: No   Drug use: No   Sexual activity: Not on file  Other Topics Concern   Not on file  Social History Narrative   Not on file   Social Determinants of Health   Financial Resource Strain: Not on file  Food Insecurity: No Food Insecurity (12/12/2021)   Hunger Vital Sign    Worried About Running Out of Food in the Last Year: Never true    Ran Out of Food in the Last Year: Never true  Transportation Needs: No Transportation Needs (12/12/2021)   PRAPARE - Administrator, Civil Service (Medical): No    Lack of Transportation (Non-Medical): No  Physical Activity: Not on file  Stress: Not on file  Social Connections: Unknown (12/18/2021)   Received from [facility]   Social Network    Social Network: Not on file   Family History  Problem Relation Age of Onset   CAD Mother    CAD Maternal Grandmother    Allergies  Allergen Reactions   Penicillins Hives    Did it involve swelling of the face/tongue/throat, SOB, or low BP? No Did it involve sudden or severe rash/hives, skin peeling, or any reaction on the inside of your mouth or nose? No Did you need to seek medical attention at a hospital or doctor's office? No When did it last happen? Unk    If all above answers are "NO", may proceed with cephalosporin use.    Sulfa Antibiotics Hives and Swelling   Current Outpatient Medications  Medication Sig Dispense Refill   acetaminophen (TYLENOL) 500 MG tablet Take 1 tablet (500 mg total) by mouth every 6 (six) hours as needed for moderate pain. (Patient taking differently: Take 1,000 mg by mouth every 6 (six) hours as needed for moderate pain.) 30 tablet 0   atorvastatin (LIPITOR) 80 MG tablet Take 80 mg by mouth at bedtime.      azithromycin (ZITHROMAX) 250 MG tablet Take 1 tablet (250 mg total) by mouth daily. Take first 2 tablets together, then 1 every day until finished. 6 tablet 0   carvedilol  (COREG) 3.125 MG tablet Take 1 tablet (3.125 mg total) by mouth 2 (two) times daily. 180 tablet 3   cyclobenzaprine (FLEXERIL) 10 MG tablet Take 5-10 mg by mouth daily as needed for muscle spasms.     diclofenac Sodium (VOLTAREN) 1 % GEL Apply 4 g topically 4 (four) times daily. 100 g 0   diphenhydrAMINE (BENADRYL) 25 MG tablet Take 25 mg by mouth every 6 (six) hours as needed for allergies. (Patient not taking: Reported on 05/20/2022)     ELIQUIS 2.5 MG TABS  tablet TAKE 1 TABLET BY MOUTH TWICE A DAY 180 tablet 1   empagliflozin (JARDIANCE) 10 MG TABS tablet Take 1 tablet (10 mg total) by mouth daily before breakfast. 90 tablet 3   furosemide (LASIX) 20 MG tablet Take 1 tablet (20 mg total) by mouth daily. 90 tablet 3   HYDROcodone-acetaminophen (NORCO) 5-325 MG tablet Take 1 tablet by mouth every 6 (six) hours as needed. 20 tablet 0   levothyroxine (SYNTHROID, LEVOTHROID) 50 MCG tablet Take 50 mcg by mouth daily before breakfast.      lidocaine (LIDODERM) 5 % Place 1 patch onto the skin daily. Remove & Discard patch within 12 hours or as directed by MD 30 patch 0   nitroGLYCERIN (NITROSTAT) 0.4 MG SL tablet Place 1 tablet (0.4 mg total) under the tongue every 5 (five) minutes as needed for chest pain. (Patient not taking: Reported on 05/20/2022) 25 tablet 6   Polyethyl Glycol-Propyl Glycol (SYSTANE OP) Place 1 drop into both eyes daily as needed (dry eye).     predniSONE (STERAPRED UNI-PAK 21 TAB) 10 MG (21) TBPK tablet Per package instructions 1 each 0   sacubitril-valsartan (ENTRESTO) 49-51 MG Take 1 tablet by mouth 2 (two) times daily. 180 tablet 3   traMADol (ULTRAM) 50 MG tablet Take 1 tablet (50 mg total) by mouth 2 (two) times daily. 30 tablet 0   VITAMIN D3 50 MCG (2000 UT) capsule Take 1 capsule by mouth daily.     No current facility-administered medications for this visit.   No results found.  Review of Systems:   A ROS was performed including pertinent positives and negatives as  documented in the HPI.  Physical Exam :   Constitutional: NAD and appears stated age Neurological: Alert and oriented Psych: Appropriate affect and cooperative There were no vitals taken for this visit.   Comprehensive Musculoskeletal Exam:    Tenderness palpation over lower lumbar spine and throughout left lumbar and gluteal area.  Significant tenderness directly over the left greater trochanter.  Minimal pain with passive left hip range of motion.  Left knee appears mildly swollen.  Passive ROM from 5 to 100 degrees without crepitus.  No instability with varus valgus stress.  Imaging:     Assessment:   89 y.o. female with left hip and knee pain.  I do believe that a lot of her pain is stemming from the low back and would encourage close follow-up with Ellin Goodie or Dr. Alvester Morin to see if she would be a candidate for ESI.  She does have evidence of L2-L3 disc herniation and L4-L5 spinal stenosis on recent CT scan.  In regards to her knee pain, there is likely a component of radiating lumbar/hip pain that is contributing, however she does have evidence of osteoarthritis on previous radiographs.  The knee is somewhat swollen today and does seem to be her main concern.  After discussion of treatment options, patient and her daughter would like to proceed with cortisone injection of the left knee.  This was performed with ultrasound guidance in clinic today and patient tolerated this well.  Will plan to see how much relief injection gives her and have her follow-up soon with Dr. [place] Blas office for further treatment.  Plan :    -Cortisone injection performed of the left knee today with ultrasound guidance after consent obtained -Follow-up with Dr. Alvester Morin for further evaluation and possible ESI    Procedure Note  Patient: Stephanie Atkinson  Date of Birth: 04-29-1924           MRN: [id number]             Visit Date: 10/24/2022  Procedures: Visit Diagnoses:  1. Acute bilateral low  back pain with sciatica, sciatica laterality unspecified   2. Unilateral primary osteoarthritis, left knee     Large Joint Inj: L knee on 10/25/2022 1:56 PM Indications: pain Details: 22 G 1.5 in needle, ultrasound-guided anterolateral approach Medications: 4 mL lidocaine 1 %; 2 mL triamcinolone acetonide 40 MG/ML Outcome: tolerated well, no immediate complications Procedure, treatment alternatives, risks and benefits explained, specific risks discussed. Consent was given by the patient. Immediately prior to procedure a time out was called to verify the correct patient, procedure, equipment, support staff and site/side marked as required. Patient was prepped and draped in the usual sterile fashion.       I personally saw and evaluated the patient, and participated in the management and treatment plan.  Hazle Nordmann, PA-C Orthopedics

## 2022-10-25 NOTE — Progress Notes (Unsigned)
Stephanie Atkinson - 89 y.o. female MRN [id number]  Date of birth: August 03, 1924  Office Visit Note: Visit Date: 10/25/2022 PCP: Linus Galas, NP Referred by: Linus Galas, NP  Subjective: Chief Complaint  Patient presents with   Lower Back - Pain   HPI: Stephanie Atkinson is a 89 y.o. female who comes in today per the request of [place] Bali Persons, PA for evaluation of chronic left sided lower back pain radiating down left leg. Daughter accompanying patient during our visit. Patient is somewhat of poor historian. Pain ongoing for several years, worsens with movement and activity, she describes as numbness/tinging and sore, currently rates as 3 out of 10. Some relief of pain with rest and use of medications. Currently taking Tramadol that is prescribed by her PCP. States she takes Tramadol twice daily with minimal relief of pain. She does have cardiac pacemaker and is unable to have MRI imaging. CT imaging of lumbar spine exhibits moderate spinal canal stenosis at L4-L5. No history of lumbar surgery/injections. Patient was seen by my colleague Hazle Nordmann, PA yesterday, he performed left knee injection. States she feels much better today, minimal left knee and lower back pain at this time. Patient ambulates with rolling walker. She denies focal weakness. No recent trauma or falls.     Review of Systems  Musculoskeletal:  Positive for back pain and myalgias.  Neurological:  Positive for tingling. Negative for focal weakness and weakness.  All other systems reviewed and are negative.  Otherwise per HPI.  Assessment & Plan: Visit Diagnoses:    ICD-10-CM   1. Lumbar radiculopathy  M54.16     2. Spinal stenosis of lumbar region with neurogenic claudication  M48.062     3. Gait abnormality  R26.9        Plan: Findings:  Chronic left sided lower back pain radiating down left leg. Patient continues to have pain despite good conservative therapies such as home exercise regimen, rest and use of  medications. Patients clinical presentation and exam are consistent with neurogenic claudication. There is moderate spinal canal stenosis at the level of L4-L5. We discussed treatment options in detail today including possibility of performing lumbar epidural steroid injection. Patient is adamant about holding on injection at this time, she would like to consult with pain management practice. Daughter states she recalls PCP did refer her to chronic pain management and will call to check on status. I informed patient we would consider injection if she changes her mind. She has no questions at this time. No red flag symptoms noted upon exam today.     Meds & Orders: No orders of the defined types were placed in this encounter.  No orders of the defined types were placed in this encounter.   Follow-up: Return if symptoms worsen or fail to improve.   Procedures: No procedures performed      Clinical History: CLINICAL DATA:  Low back pain with increased fracture risk   EXAM: CT LUMBAR SPINE WITHOUT CONTRAST   TECHNIQUE: Multidetector CT imaging of the lumbar spine was performed without intravenous contrast administration. Multiplanar CT image reconstructions were also generated.   RADIATION DOSE REDUCTION: This exam was performed according to the departmental dose-optimization program which includes automated exposure control, adjustment of the mA and/or kV according to patient size and/or use of iterative reconstruction technique.   COMPARISON:  02/28/2022   FINDINGS: Segmentation: 5 lumbar type vertebrae.   Alignment: No traumatic malalignment.  Minor dextrocurvature   Vertebrae: Subjective osteopenia.  Paraspinal and other soft tissues: Atherosclerosis and colonic diverticulosis. No perispinal inflammation or masslike finding   Disc levels: Generalized disc bulging. Notable left paracentral herniation at L2-3 which is chronic and presumably on the descending left L3 nerve  root. Spinal stenosis that is moderate at L4-5.   IMPRESSION: 1. No acute finding. 2. Generalized lumbar spine degeneration with chronic stenoses as described.     Electronically Signed   By: Tiburcio Pea M.D.   On: 08/18/2022 08:13   She reports that she has never smoked. Her smokeless tobacco use includes snuff. No results for input(s): "HGBA1C", "LABURIC" in the last 8760 hours.  Objective:  VS:  HT:    WT:   BMI:     BP:   HR: bpm  TEMP: ( )  RESP:  Physical Exam Vitals and nursing note reviewed.  HENT:     Head: Normocephalic and atraumatic.     Right Ear: External ear normal.     Left Ear: External ear normal.     Nose: Nose normal.     Mouth/Throat:     Mouth: Mucous membranes are moist.  Eyes:     Extraocular Movements: Extraocular movements intact.  Cardiovascular:     Rate and Rhythm: Normal rate.     Pulses: Normal pulses.  Pulmonary:     Effort: Pulmonary effort is normal.  Abdominal:     General: Abdomen is flat. There is no distension.  Musculoskeletal:        General: Tenderness present.     Cervical back: Normal range of motion.     Comments: Patient is slow to rise from seated position to standing. Good lumbar range of motion. No pain noted with facet loading. 5/5 strength noted with bilateral hip flexion, knee flexion/extension, ankle dorsiflexion/plantarflexion and EHL. No clonus noted bilaterally. No pain upon palpation of greater trochanters. No pain with internal/external rotation of bilateral hips. Sensation intact bilaterally. Negative slump test bilaterally. Ambulates with rolling walker, gait slow and unsteady.   Skin:    General: Skin is warm and dry.     Capillary Refill: Capillary refill takes less than 2 seconds.  Neurological:     Mental Status: She is alert and oriented to person, place, and time.     Gait: Gait abnormal.  Psychiatric:        Mood and Affect: Mood normal.        Behavior: Behavior normal.     Ortho  Exam  Imaging: No results found.  Past Medical/Family/Surgical/Social History: Medications & Allergies reviewed per EMR, new medications updated. Patient Active Problem List   Diagnosis Date Noted   Low back pain 06/14/2022   CHF (congestive heart failure) (HCC) 12/11/2021   Acute on chronic systolic CHF (congestive heart failure) (HCC) 12/09/2021   Venous stasis dermatitis of both lower extremities 01/06/2020   Unilateral primary osteoarthritis, right knee 01/06/2020   Community acquired bacterial pneumonia 06/23/2019   AKI (acute kidney injury) (HCC) 06/23/2019   Synovitis of right knee 03/02/2019   Chronic diastolic heart failure (HCC) 09/14/2018   Pacemaker 07/16/2017   Atrial fibrillation with slow ventricular response (HCC) 06/27/2017   Hypercholesterolemia 06/27/2017   Symptomatic bradycardia s/p PPM  06/27/2017   Chronic pain of right knee 05/17/2016   De Quervain's tenosynovitis 05/17/2016   CAD (coronary artery disease) of artery bypass graft 02/13/2015   Encounter for loop recorder check 02/01/2014   Accelerated junctional rhythm 05/04/2013   Dehydration 10/14/2012   Syncope and collapse 10/13/2012  History of first degree atrioventricular block 10/11/2012   Long term (current) use of anticoagulants 10/06/2012   AVB - beta blocker and Amiodarone stopped 07/10/2012   Hypertension    Hx of CABG X 4 3/06-     Dyslipidemia    RCA DES placed 7/12- patent 8/14    Permanent atrial fibrillation (HCC)    Acute kidney injury superimposed on chronic kidney disease (HCC)    Past Medical History:  Diagnosis Date   Chronic kidney disease, stage 3 (HCC)    Coronary artery disease    Hx of CABG 2006   LIMA-LAD, free RIMA-PDA, Radial-OM1-OM2   Hyperlipidemia    Hypertension    Hypothyroid    MI, old        PAF (paroxysmal atrial fibrillation) (HCC)    was on amio, d/c'd due to prolonged PR interval, rate control   Presence of permanent cardiac pacemaker 07/16/2017    Presence of stent in right coronary artery 07/12   RIMA-PDA occluded, DES stent placed   Syncope 07/12   bradycardic, beta blocker decreased, also felt to be dehydrated   Family History  Problem Relation Age of Onset   CAD Mother    CAD Maternal Grandmother    Past Surgical History:  Procedure Laterality Date   CARDIAC CATHETERIZATION  [phone]   dr. Onalee Hua harding, revealing a atretic bypass to her right with patent distal RCA stent   CAROTID STENT  2012   CORONARY ARTERY BYPASS GRAFT  2006   LIMA-LAD, free RIMA-PDA, Radial-OM1-OM2   DOPPLER ECHOCARDIOGRAPHY  [id number]   mild asymmetric left ventricular hypertrophy, left ventricular systolic function is low normal, ejection fraction = 50-55%, the LA is moderate dilated, the RA is mildly dilated, no significant valvular disease   INSERT / REPLACE / REMOVE PACEMAKER  07/16/2017   JOINT REPLACEMENT     hip replacement   LEFT HEART CATHETERIZATION WITH CORONARY/GRAFT ANGIOGRAM N/A 10/12/2012   Procedure: LEFT HEART CATHETERIZATION WITH Isabel Caprice;  Surgeon: Marykay Lex, MD;  Location: [facility] CATH LAB;  Service: Cardiovascular;  Laterality: N/A;   LOOP RECORDER IMPLANT N/A 10/15/2012   Procedure: LOOP RECORDER IMPLANT;  Surgeon: Thurmon Fair, MD;  Location: [facility] CATH LAB;  Service: Cardiovascular;  Laterality: N/A;   LOOP RECORDER REMOVAL  07/16/2017   LOOP RECORDER REMOVAL N/A 07/16/2017   Procedure: LOOP RECORDER REMOVAL;  Surgeon: Thurmon Fair, MD;  Location: [facility] INVASIVE CV LAB;  Service: Cardiovascular;  Laterality: N/A;   NM MYOVIEW LTD  100510   post stress left ventricle is normal in size, post stress ejection fraction is 67% global left ventricular systolic function is normal, normal myocardial perfusion study, abnormal myocardial perfusion study, low risk scan   PACEMAKER IMPLANT N/A 07/16/2017   Procedure: PACEMAKER IMPLANT;  Surgeon: Thurmon Fair, MD;  Location: [facility] INVASIVE CV LAB;  Service: Cardiovascular;   Laterality: N/A;   THYROIDECTOMY     Social History   Occupational History   Not on file  Tobacco Use   Smoking status: Never   Smokeless tobacco: Current    Types: Snuff  Vaping Use   Vaping status: Never Used  Substance and Sexual Activity   Alcohol use: No   Drug use: No   Sexual activity: Not on file

## 2022-10-30 DIAGNOSIS — M25552 Pain in left hip: Secondary | ICD-10-CM | POA: Diagnosis not present

## 2022-10-30 DIAGNOSIS — I4891 Unspecified atrial fibrillation: Secondary | ICD-10-CM | POA: Diagnosis not present

## 2022-10-30 DIAGNOSIS — I130 Hypertensive heart and chronic kidney disease with heart failure and stage 1 through stage 4 chronic kidney disease, or unspecified chronic kidney disease: Secondary | ICD-10-CM | POA: Diagnosis not present

## 2022-10-30 DIAGNOSIS — I5032 Chronic diastolic (congestive) heart failure: Secondary | ICD-10-CM | POA: Diagnosis not present

## 2022-10-30 DIAGNOSIS — M1612 Unilateral primary osteoarthritis, left hip: Secondary | ICD-10-CM | POA: Diagnosis not present

## 2022-10-30 DIAGNOSIS — E7800 Pure hypercholesterolemia, unspecified: Secondary | ICD-10-CM | POA: Diagnosis not present

## 2022-10-30 DIAGNOSIS — Z7901 Long term (current) use of anticoagulants: Secondary | ICD-10-CM | POA: Diagnosis not present

## 2022-10-30 DIAGNOSIS — I2510 Atherosclerotic heart disease of native coronary artery without angina pectoris: Secondary | ICD-10-CM | POA: Diagnosis not present

## 2022-10-30 DIAGNOSIS — N183 Chronic kidney disease, stage 3 unspecified: Secondary | ICD-10-CM | POA: Diagnosis not present

## 2022-11-04 DIAGNOSIS — M48061 Spinal stenosis, lumbar region without neurogenic claudication: Secondary | ICD-10-CM | POA: Diagnosis not present

## 2022-11-04 DIAGNOSIS — M5442 Lumbago with sciatica, left side: Secondary | ICD-10-CM | POA: Diagnosis not present

## 2022-11-04 DIAGNOSIS — M5136 Other intervertebral disc degeneration, lumbar region: Secondary | ICD-10-CM | POA: Diagnosis not present

## 2022-11-06 DIAGNOSIS — I4891 Unspecified atrial fibrillation: Secondary | ICD-10-CM | POA: Diagnosis not present

## 2022-11-06 DIAGNOSIS — Z7901 Long term (current) use of anticoagulants: Secondary | ICD-10-CM | POA: Diagnosis not present

## 2022-11-06 DIAGNOSIS — E7800 Pure hypercholesterolemia, unspecified: Secondary | ICD-10-CM | POA: Diagnosis not present

## 2022-11-06 DIAGNOSIS — I5032 Chronic diastolic (congestive) heart failure: Secondary | ICD-10-CM | POA: Diagnosis not present

## 2022-11-06 DIAGNOSIS — N183 Chronic kidney disease, stage 3 unspecified: Secondary | ICD-10-CM | POA: Diagnosis not present

## 2022-11-06 DIAGNOSIS — M25552 Pain in left hip: Secondary | ICD-10-CM | POA: Diagnosis not present

## 2022-11-06 DIAGNOSIS — I130 Hypertensive heart and chronic kidney disease with heart failure and stage 1 through stage 4 chronic kidney disease, or unspecified chronic kidney disease: Secondary | ICD-10-CM | POA: Diagnosis not present

## 2022-11-06 DIAGNOSIS — M1612 Unilateral primary osteoarthritis, left hip: Secondary | ICD-10-CM | POA: Diagnosis not present

## 2022-11-06 DIAGNOSIS — I2510 Atherosclerotic heart disease of native coronary artery without angina pectoris: Secondary | ICD-10-CM | POA: Diagnosis not present

## 2022-11-12 DIAGNOSIS — Z7901 Long term (current) use of anticoagulants: Secondary | ICD-10-CM | POA: Diagnosis not present

## 2022-11-12 DIAGNOSIS — I2510 Atherosclerotic heart disease of native coronary artery without angina pectoris: Secondary | ICD-10-CM | POA: Diagnosis not present

## 2022-11-12 DIAGNOSIS — M25552 Pain in left hip: Secondary | ICD-10-CM | POA: Diagnosis not present

## 2022-11-12 DIAGNOSIS — I5032 Chronic diastolic (congestive) heart failure: Secondary | ICD-10-CM | POA: Diagnosis not present

## 2022-11-12 DIAGNOSIS — I4891 Unspecified atrial fibrillation: Secondary | ICD-10-CM | POA: Diagnosis not present

## 2022-11-12 DIAGNOSIS — E7800 Pure hypercholesterolemia, unspecified: Secondary | ICD-10-CM | POA: Diagnosis not present

## 2022-11-12 DIAGNOSIS — I130 Hypertensive heart and chronic kidney disease with heart failure and stage 1 through stage 4 chronic kidney disease, or unspecified chronic kidney disease: Secondary | ICD-10-CM | POA: Diagnosis not present

## 2022-11-12 DIAGNOSIS — N183 Chronic kidney disease, stage 3 unspecified: Secondary | ICD-10-CM | POA: Diagnosis not present

## 2022-11-12 DIAGNOSIS — M1612 Unilateral primary osteoarthritis, left hip: Secondary | ICD-10-CM | POA: Diagnosis not present

## 2022-11-14 ENCOUNTER — Emergency Department (HOSPITAL_COMMUNITY): Payer: Medicare PPO

## 2022-11-14 ENCOUNTER — Emergency Department (HOSPITAL_COMMUNITY)
Admission: EM | Admit: 2022-11-14 | Discharge: 2022-11-14 | Disposition: A | Discharge status: Home / Self Care | Payer: Medicare PPO | Attending: Emergency Medicine | Admitting: Emergency Medicine

## 2022-11-14 ENCOUNTER — Encounter (HOSPITAL_COMMUNITY): Payer: Self-pay | Admitting: Emergency Medicine

## 2022-11-14 ENCOUNTER — Other Ambulatory Visit: Payer: Self-pay

## 2022-11-14 DIAGNOSIS — I110 Hypertensive heart disease with heart failure: Secondary | ICD-10-CM | POA: Diagnosis not present

## 2022-11-14 DIAGNOSIS — I4891 Unspecified atrial fibrillation: Secondary | ICD-10-CM | POA: Insufficient documentation

## 2022-11-14 DIAGNOSIS — Z950 Presence of cardiac pacemaker: Secondary | ICD-10-CM | POA: Insufficient documentation

## 2022-11-14 DIAGNOSIS — I10 Essential (primary) hypertension: Secondary | ICD-10-CM | POA: Diagnosis not present

## 2022-11-14 DIAGNOSIS — I509 Heart failure, unspecified: Secondary | ICD-10-CM | POA: Insufficient documentation

## 2022-11-14 DIAGNOSIS — Z79899 Other long term (current) drug therapy: Secondary | ICD-10-CM | POA: Diagnosis not present

## 2022-11-14 DIAGNOSIS — D696 Thrombocytopenia, unspecified: Secondary | ICD-10-CM | POA: Diagnosis not present

## 2022-11-14 DIAGNOSIS — Z7901 Long term (current) use of anticoagulants: Secondary | ICD-10-CM | POA: Diagnosis not present

## 2022-11-14 DIAGNOSIS — G44209 Tension-type headache, unspecified, not intractable: Secondary | ICD-10-CM | POA: Insufficient documentation

## 2022-11-14 DIAGNOSIS — R519 Headache, unspecified: Secondary | ICD-10-CM | POA: Diagnosis not present

## 2022-11-14 DIAGNOSIS — I1A0 Resistant hypertension: Secondary | ICD-10-CM

## 2022-11-14 DIAGNOSIS — I6523 Occlusion and stenosis of bilateral carotid arteries: Secondary | ICD-10-CM | POA: Diagnosis not present

## 2022-11-14 DIAGNOSIS — Z955 Presence of coronary angioplasty implant and graft: Secondary | ICD-10-CM | POA: Diagnosis not present

## 2022-11-14 DIAGNOSIS — I6782 Cerebral ischemia: Secondary | ICD-10-CM | POA: Diagnosis not present

## 2022-11-14 DIAGNOSIS — G4489 Other headache syndrome: Secondary | ICD-10-CM | POA: Diagnosis not present

## 2022-11-14 LAB — CBC WITH DIFFERENTIAL/PLATELET
Abs Immature Granulocytes: 0.01 10*3/uL (ref 0.00–0.07)
Basophils Absolute: 0 10*3/uL (ref 0.0–0.1)
Basophils Relative: 0 %
Eosinophils Absolute: 0 10*3/uL (ref 0.0–0.5)
Eosinophils Relative: 0 %
HCT: 42 % (ref 36.0–46.0)
Hemoglobin: 13.3 g/dL (ref 12.0–15.0)
Immature Granulocytes: 0 %
Lymphocytes Relative: 20 %
Lymphs Abs: 1 10*3/uL (ref 0.7–4.0)
MCH: 28.9 pg (ref 26.0–34.0)
MCHC: 31.7 g/dL (ref 30.0–36.0)
MCV: 91.1 fL (ref 80.0–100.0)
Monocytes Absolute: 0.4 10*3/uL (ref 0.1–1.0)
Monocytes Relative: 8 %
Neutro Abs: 3.6 10*3/uL (ref 1.7–7.7)
Neutrophils Relative %: 72 %
Platelets: 136 10*3/uL — ABNORMAL LOW (ref 150–400)
RBC: 4.61 MIL/uL (ref 3.87–5.11)
RDW: 14.6 % (ref 11.5–15.5)
WBC: 5.1 10*3/uL (ref 4.0–10.5)
nRBC: 0 % (ref 0.0–0.2)

## 2022-11-14 LAB — COMPREHENSIVE METABOLIC PANEL
ALT: 32 U/L (ref 0–44)
AST: 31 U/L (ref 15–41)
Albumin: 3.4 g/dL — ABNORMAL LOW (ref 3.5–5.0)
Alkaline Phosphatase: 60 U/L (ref 38–126)
Anion gap: 5 (ref 5–15)
BUN: 21 mg/dL (ref 8–23)
CO2: 22 mmol/L (ref 22–32)
Calcium: 10 mg/dL (ref 8.9–10.3)
Chloride: 109 mmol/L (ref 98–111)
Creatinine, Ser: 1.31 mg/dL — ABNORMAL HIGH (ref 0.44–1.00)
GFR, Estimated: 37 mL/min — ABNORMAL LOW (ref >60)
Glucose, Bld: 88 mg/dL (ref 70–99)
Potassium: 4.1 mmol/L (ref 3.5–5.1)
Sodium: 136 mmol/L (ref 135–145)
Total Bilirubin: 0.9 mg/dL (ref 0.3–1.2)
Total Protein: 6 g/dL — ABNORMAL LOW (ref 6.5–8.1)

## 2022-11-14 MED ORDER — GABAPENTIN 300 MG PO CAPS
300.0000 mg | ORAL_CAPSULE | Freq: Once | ORAL | Status: AC
  Administered 2022-11-14: 300 mg via ORAL
  Filled 2022-11-14: qty 1

## 2022-11-14 MED ORDER — TRAMADOL HCL 50 MG PO TABS
50.0000 mg | ORAL_TABLET | Freq: Once | ORAL | Status: AC
  Administered 2022-11-14: 50 mg via ORAL
  Filled 2022-11-14: qty 1

## 2022-11-14 MED ORDER — SACUBITRIL-VALSARTAN 49-51 MG PO TABS
1.0000 | ORAL_TABLET | Freq: Two times a day (BID) | ORAL | Status: DC
  Administered 2022-11-14: 1 via ORAL
  Filled 2022-11-14: qty 1

## 2022-11-14 MED ORDER — CARVEDILOL 3.125 MG PO TABS
3.1250 mg | ORAL_TABLET | Freq: Two times a day (BID) | ORAL | Status: DC
  Administered 2022-11-14: 3.125 mg via ORAL
  Filled 2022-11-14: qty 1

## 2022-11-14 MED ORDER — APIXABAN 2.5 MG PO TABS
2.5000 mg | ORAL_TABLET | Freq: Once | ORAL | Status: AC
  Administered 2022-11-14: 2.5 mg via ORAL
  Filled 2022-11-14: qty 1

## 2022-11-14 NOTE — ED Provider Notes (Signed)
[place] EMERGENCY DEPARTMENT AT [facility] Provider Note   CSN: [id number] Arrival date & time: 11/14/22  2106     History  No chief complaint on file.   Stephanie Atkinson is a 89 y.o. female with history of A-fib on Eliquis, hypertension, CABGx4, heart failure with pacemaker, presents with concern for a headache and higher blood pressure than normal.  Her headache involves the whole head and she noticed it when she woke up this morning.  She does not normally get headaches.  Denies any falls or trauma, photosensitivity, nausea or vomiting, changes in vision.  Also noticed her blood pressure was higher than normal today.  States it runs a little higher, but not this high.  Unsure where it is normally at.  Denies any chest pain, shortness of breath, fever or chills, neck stiffness.  Reports she is taking all of her medication as prescribed.  Unsure of what blood pressure medication she is on.  HPI     Home Medications Prior to Admission medications   Medication Sig Start Date End Date Taking? Authorizing Provider  acetaminophen (TYLENOL) 500 MG tablet Take 1 tablet (500 mg total) by mouth every 6 (six) hours as needed for moderate pain. Patient taking differently: Take 1,000 mg by mouth every 6 (six) hours as needed for moderate pain. 06/27/19   Briant Cedar, MD  atorvastatin (LIPITOR) 80 MG tablet Take 80 mg by mouth at bedtime.     [provider]  azithromycin (ZITHROMAX) 250 MG tablet Take 1 tablet (250 mg total) by mouth daily. Take first 2 tablets together, then 1 every day until finished. 02/26/22   Pricilla Loveless, MD  carvedilol (COREG) 3.125 MG tablet Take 1 tablet (3.125 mg total) by mouth 2 (two) times daily. 04/10/22   Runell Gess, MD  cyclobenzaprine (FLEXERIL) 10 MG tablet Take 5-10 mg by mouth daily as needed for muscle spasms. 05/30/16   [provider]  diclofenac Sodium (VOLTAREN) 1 % GEL Apply 4 g topically 4 (four) times daily.  03/12/22   Palumbo, April, MD  diphenhydrAMINE (BENADRYL) 25 MG tablet Take 25 mg by mouth every 6 (six) hours as needed for allergies. Patient not taking: Reported on 05/20/2022    [provider]  ELIQUIS 2.5 MG TABS tablet TAKE 1 TABLET BY MOUTH TWICE A DAY 09/26/22   Runell Gess, MD  empagliflozin (JARDIANCE) 10 MG TABS tablet Take 1 tablet (10 mg total) by mouth daily before breakfast. 05/27/22   Croitoru, Mihai, MD  furosemide (LASIX) 20 MG tablet Take 1 tablet (20 mg total) by mouth daily. 01/04/22   Croitoru, Mihai, MD  HYDROcodone-acetaminophen (NORCO) 5-325 MG tablet Take 1 tablet by mouth every 6 (six) hours as needed. 10/16/22   Tarry Kos, MD  levothyroxine (SYNTHROID, LEVOTHROID) 50 MCG tablet Take 50 mcg by mouth daily before breakfast.     [provider]  lidocaine (LIDODERM) 5 % Place 1 patch onto the skin daily. Remove & Discard patch within 12 hours or as directed by MD 03/12/22   Nicanor Alcon, April, MD  nitroGLYCERIN (NITROSTAT) 0.4 MG SL tablet Place 1 tablet (0.4 mg total) under the tongue every 5 (five) minutes as needed for chest pain. Patient not taking: Reported on 05/20/2022 05/07/21   Runell Gess, MD  Polyethyl Glycol-Propyl Glycol (SYSTANE OP) Place 1 drop into both eyes daily as needed (dry eye).    [provider]  predniSONE (STERAPRED UNI-PAK 21 TAB) 10 MG (  21) TBPK tablet Per package instructions 10/07/22   Cristie Hem, PA-C  sacubitril-valsartan (ENTRESTO) 49-51 MG Take 1 tablet by mouth 2 (two) times daily. 02/27/22   Croitoru, Mihai, MD  traMADol (ULTRAM) 50 MG tablet Take 1 tablet (50 mg total) by mouth 2 (two) times daily. 10/15/22   Tarry Kos, MD  VITAMIN D3 50 MCG (2000 UT) capsule Take 1 capsule by mouth daily. 02/03/22   [provider]      Allergies    Penicillins and Sulfa antibiotics    Review of Systems   Review of Systems  Constitutional:  Negative for fever.  Neurological:  Positive for headaches.     Physical Exam Updated Vital Signs BP (!) 180/76   Pulse 76   Temp 97.9 F (36.6 C) (Oral)   Resp 18   Wt 65 kg   SpO2 98%   BMI 26.21 kg/m  Physical Exam Vitals and nursing note reviewed.  Constitutional:      General: She is not in acute distress.    Appearance: She is well-developed.  HENT:     Head: Normocephalic and atraumatic.  Eyes:     Extraocular Movements: Extraocular movements intact.     Conjunctiva/sclera: Conjunctivae normal.     Pupils: Pupils are equal, round, and reactive to light.  Neck:     Comments: No neck stiffness, able to rotate and flex and extend neck without pain Cardiovascular:     Rate and Rhythm: Normal rate and regular rhythm.     Heart sounds: No murmur heard. Pulmonary:     Effort: Pulmonary effort is normal. No respiratory distress.     Breath sounds: Normal breath sounds.  Abdominal:     Palpations: Abdomen is soft.     Tenderness: There is no abdominal tenderness.  Musculoskeletal:        General: No swelling.     Cervical back: Normal range of motion and neck supple.  Skin:    General: Skin is warm and dry.     Capillary Refill: Capillary refill takes less than 2 seconds.  Neurological:     Mental Status: She is alert.     Comments: Cranial nerves III through XII intact Alert and oriented x4  Psychiatric:        Mood and Affect: Mood normal.     ED Results / Procedures / Treatments   Labs (all labs ordered are listed, but only abnormal results are displayed) Labs Reviewed  CBC WITH DIFFERENTIAL/PLATELET  COMPREHENSIVE METABOLIC PANEL    EKG None  Radiology No results found.  Procedures Procedures    Medications Ordered in ED Medications - No data to display  ED Course/ Medical Decision Making/ A&P                                 Medical Decision Making Amount and/or Complexity of Data Reviewed Labs: ordered. Radiology: ordered.   89 y.o. female with pertinent past medical history of A-fib on  Eliquis, hypertension, CABGx4, heart failure with pacemaker, presents to the ED for concern of headache since this morning, high blood pressure  Differential diagnosis includes but is not limited to tension headache, migraine, intracranial hemorrhage, hypertensive urgency, hypertensive emergency, meningitis  ED Course:  Patient presents with concern for a headache involving the whole head that started this morning.  Was gradual in onset.  No associated neck stiffness, fever or chills, changes in vision,  nausea or vomiting.  However, does not usually have headaches.  Will obtain CT head for further evaluation. Low concern for meningitis given no fever, no neck stiffness. Patient also reports concern for higher blood pressure than normal.  Blood pressure upon arrival is 180/76.  Upon review of prior ER records from 2 and 5 months ago, blood pressure looks to be around the systolics of 140s to 150s.  She reports taking her blood pressure medications as prescribed, however unsure what medication she takes.    Impression: Headache Elevated BP  Disposition:  Care of this patient signed out to oncoming ED provider Dr. Charm Barges. Disposition and treatment plan pending imaging results and clinical judgment of oncoming ED team.    Lab Tests: I Ordered, and personally interpreted labs.  The pertinent results include:   CBC, CMP pending  Imaging Studies ordered: I ordered imaging studies including CT head, pending            Final Clinical Impression(s) / ED Diagnoses Final diagnoses:  Tension headache    Rx / DC Orders ED Discharge Orders     None         Arabella Merles, Cordelia Poche 11/14/22 2149    Terrilee Files, MD 11/15/22 1102

## 2022-11-14 NOTE — ED Notes (Signed)
Discharge instructions reviewed with pt and pt family. Patient family states she understood the information, that it was sufficient, and neither the patient or patient family member had any further questions. Patient alert and oriented x4, in no acute distress at time of discharge.

## 2022-11-14 NOTE — Discharge Instructions (Addendum)
You were seen in the emergency department for evaluation of elevated blood pressure and headache.  Your lab work showed you to maybe be slightly dehydrated with mildly elevated creatinine and also your platelets were slightly low. your CAT scan of your head did not show any acute findings.  You were given your evening blood pressure and pain medications with improvement in your symptoms.  Please continue regular medications and follow-up with your primary care doctor for further evaluation and repeat blood work.  Return to the emergency department if any worsening or concerning symptoms.

## 2022-11-14 NOTE — ED Triage Notes (Signed)
Patient BIB [facility] from home c/o headache and htn of 160/100 which she reports is high for her.  Patient has history of htn and has not taken evening htn meds yet.  Patient also endorses chronic arthritis pain in left leg/knee.   190/98 No neuro deficits HR 87 96% RA 108 CBG

## 2022-11-20 DIAGNOSIS — I130 Hypertensive heart and chronic kidney disease with heart failure and stage 1 through stage 4 chronic kidney disease, or unspecified chronic kidney disease: Secondary | ICD-10-CM | POA: Diagnosis not present

## 2022-11-20 DIAGNOSIS — I5032 Chronic diastolic (congestive) heart failure: Secondary | ICD-10-CM | POA: Diagnosis not present

## 2022-11-20 DIAGNOSIS — N183 Chronic kidney disease, stage 3 unspecified: Secondary | ICD-10-CM | POA: Diagnosis not present

## 2022-11-20 DIAGNOSIS — Z7901 Long term (current) use of anticoagulants: Secondary | ICD-10-CM | POA: Diagnosis not present

## 2022-11-20 DIAGNOSIS — I2510 Atherosclerotic heart disease of native coronary artery without angina pectoris: Secondary | ICD-10-CM | POA: Diagnosis not present

## 2022-11-20 DIAGNOSIS — M25552 Pain in left hip: Secondary | ICD-10-CM | POA: Diagnosis not present

## 2022-11-20 DIAGNOSIS — I4891 Unspecified atrial fibrillation: Secondary | ICD-10-CM | POA: Diagnosis not present

## 2022-11-20 DIAGNOSIS — M1612 Unilateral primary osteoarthritis, left hip: Secondary | ICD-10-CM | POA: Diagnosis not present

## 2022-11-20 DIAGNOSIS — E7800 Pure hypercholesterolemia, unspecified: Secondary | ICD-10-CM | POA: Diagnosis not present

## 2022-11-25 DIAGNOSIS — I10 Essential (primary) hypertension: Secondary | ICD-10-CM | POA: Diagnosis not present

## 2022-11-25 DIAGNOSIS — G8929 Other chronic pain: Secondary | ICD-10-CM | POA: Diagnosis not present

## 2022-11-25 DIAGNOSIS — Z09 Encounter for follow-up examination after completed treatment for conditions other than malignant neoplasm: Secondary | ICD-10-CM | POA: Diagnosis not present

## 2022-11-25 DIAGNOSIS — R4589 Other symptoms and signs involving emotional state: Secondary | ICD-10-CM | POA: Diagnosis not present

## 2022-11-27 ENCOUNTER — Telehealth: Payer: Self-pay | Admitting: Cardiovascular Disease

## 2022-11-27 NOTE — Telephone Encounter (Signed)
Left voicemail to return call to office.

## 2022-11-27 NOTE — Telephone Encounter (Signed)
Patient is asking that the nurse gives her a call back. Please advise

## 2022-11-28 ENCOUNTER — Ambulatory Visit: Payer: Medicare PPO | Admitting: Cardiovascular Disease

## 2022-11-28 NOTE — Telephone Encounter (Signed)
Call and goes straight to VM.  LM to call office

## 2022-11-29 ENCOUNTER — Encounter: Payer: Self-pay | Admitting: Cardiovascular Disease

## 2022-12-02 NOTE — Telephone Encounter (Signed)
Third attempt to call patient.  LM to call if still has questions.

## 2022-12-09 DIAGNOSIS — H04123 Dry eye syndrome of bilateral lacrimal glands: Secondary | ICD-10-CM | POA: Diagnosis not present

## 2022-12-16 ENCOUNTER — Other Ambulatory Visit: Payer: Self-pay | Admitting: Cardiovascular Disease

## 2022-12-16 DIAGNOSIS — Z23 Encounter for immunization: Secondary | ICD-10-CM | POA: Diagnosis not present

## 2022-12-23 DIAGNOSIS — I5032 Chronic diastolic (congestive) heart failure: Secondary | ICD-10-CM | POA: Diagnosis not present

## 2022-12-23 DIAGNOSIS — E213 Hyperparathyroidism, unspecified: Secondary | ICD-10-CM | POA: Diagnosis not present

## 2022-12-23 DIAGNOSIS — I130 Hypertensive heart and chronic kidney disease with heart failure and stage 1 through stage 4 chronic kidney disease, or unspecified chronic kidney disease: Secondary | ICD-10-CM | POA: Diagnosis not present

## 2022-12-23 DIAGNOSIS — R7303 Prediabetes: Secondary | ICD-10-CM | POA: Diagnosis not present

## 2022-12-23 DIAGNOSIS — N1831 Chronic kidney disease, stage 3a: Secondary | ICD-10-CM | POA: Diagnosis not present

## 2022-12-24 LAB — LAB REPORT - SCANNED: EGFR: 43

## 2023-01-01 ENCOUNTER — Other Ambulatory Visit: Payer: Self-pay | Admitting: Orthopaedic Surgery

## 2023-01-06 ENCOUNTER — Telehealth: Payer: Self-pay

## 2023-01-06 ENCOUNTER — Encounter: Payer: Self-pay | Admitting: Nurse Practitioner

## 2023-01-06 ENCOUNTER — Other Ambulatory Visit: Payer: Self-pay

## 2023-01-06 ENCOUNTER — Ambulatory Visit: Payer: Medicare PPO | Attending: Nurse Practitioner | Admitting: Nurse Practitioner

## 2023-01-06 VITALS — BP 140/58 | HR 62 | Ht 62.0 in | Wt 135.4 lb

## 2023-01-06 DIAGNOSIS — E039 Hypothyroidism, unspecified: Secondary | ICD-10-CM

## 2023-01-06 DIAGNOSIS — I5022 Chronic systolic (congestive) heart failure: Secondary | ICD-10-CM

## 2023-01-06 DIAGNOSIS — I071 Rheumatic tricuspid insufficiency: Secondary | ICD-10-CM | POA: Diagnosis not present

## 2023-01-06 DIAGNOSIS — I340 Nonrheumatic mitral (valve) insufficiency: Secondary | ICD-10-CM

## 2023-01-06 DIAGNOSIS — N183 Chronic kidney disease, stage 3 unspecified: Secondary | ICD-10-CM

## 2023-01-06 DIAGNOSIS — I10 Essential (primary) hypertension: Secondary | ICD-10-CM

## 2023-01-06 DIAGNOSIS — I4891 Unspecified atrial fibrillation: Secondary | ICD-10-CM

## 2023-01-06 DIAGNOSIS — E785 Hyperlipidemia, unspecified: Secondary | ICD-10-CM

## 2023-01-06 DIAGNOSIS — Z87898 Personal history of other specified conditions: Secondary | ICD-10-CM | POA: Diagnosis not present

## 2023-01-06 DIAGNOSIS — I2510 Atherosclerotic heart disease of native coronary artery without angina pectoris: Secondary | ICD-10-CM

## 2023-01-06 DIAGNOSIS — Z950 Presence of cardiac pacemaker: Secondary | ICD-10-CM

## 2023-01-06 MED ORDER — FUROSEMIDE 40 MG PO TABS: 40.0000 mg | ORAL_TABLET | Freq: Every day | ORAL | 3 refills | Status: DC

## 2023-01-06 NOTE — Patient Instructions (Addendum)
Medication Instructions:  Increase Lasix (Furosemide) 40 mg daily  *If you need a refill on your cardiac medications before your next appointment, please call your pharmacy*   Lab Work: Your physician recommends that you return for lab work in 2 weeks. BMET    Testing/Procedures: NONE ordered at this time of appointment    Follow-Up: At [facility], you and your health needs are our priority.  As part of our continuing mission to provide you with exceptional heart care, we have created designated Provider Care Teams.  These Care Teams include your primary Cardiologist (physician) and Advanced Practice Providers (APPs -  Physician Assistants and Nurse Practitioners) who all work together to provide you with the care you need, when you need it.  We recommend signing up for the patient portal called "MyChart".  Sign up information is provided on this After Visit Summary.  MyChart is used to connect with patients for Virtual Visits (Telemedicine).  Patients are able to view lab/test results, encounter notes, upcoming appointments, etc.  Non-urgent messages can be sent to your provider as well.   To learn more about what you can do with MyChart, go to [email or web address].    Your next appointment:   Keep follow up with Dr. Royann Shivers 6 months office visit   Provider:   Nanetta Batty, MD     Other Instructions

## 2023-01-06 NOTE — Progress Notes (Signed)
Office Visit    Patient Name: Stephanie Atkinson Date of Encounter: 01/06/2023  Primary Care Provider:  Linus Galas, NP Primary Cardiologist:  Nanetta Batty, MD  Chief Complaint    89 year old female with a history of CAD s/p CABG x4 in 2006, s/p DES-PDA in 2012, paroxysmal atrial fibrillation, HFmrEF, mitral valve regurgitation, tricuspid valve regurgitation, syncope s/p ILR in 2014, symptomatic bradycardia s/p permanent transvenous pacemaker in 2019, hypertension, hyperlipidemia, CKD stage III, and hypothyroidism who presents for follow-up related to heart failure.   Past Medical History    Past Medical History:  Diagnosis Date   Chronic kidney disease, stage 3 (HCC)    Coronary artery disease    Hx of CABG 2006   LIMA-LAD, free RIMA-PDA, Radial-OM1-OM2   Hyperlipidemia    Hypertension    Hypothyroid    MI, old        PAF (paroxysmal atrial fibrillation) (HCC)    was on amio, d/c'd due to prolonged PR interval, rate control   Presence of permanent cardiac pacemaker 07/16/2017   Presence of stent in right coronary artery 07/12   RIMA-PDA occluded, DES stent placed   Syncope 07/12   bradycardic, beta blocker decreased, also felt to be dehydrated   Past Surgical History:  Procedure Laterality Date   CARDIAC CATHETERIZATION  101512   dr. Onalee Hua harding, revealing a atretic bypass to her right with patent distal RCA stent   CAROTID STENT  2012   CORONARY ARTERY BYPASS GRAFT  2006   LIMA-LAD, free RIMA-PDA, Radial-OM1-OM2   DOPPLER ECHOCARDIOGRAPHY  213086   mild asymmetric left ventricular hypertrophy, left ventricular systolic function is low normal, ejection fraction = 50-55%, the LA is moderate dilated, the RA is mildly dilated, no significant valvular disease   INSERT / REPLACE / REMOVE PACEMAKER  07/16/2017   JOINT REPLACEMENT     hip replacement   LEFT HEART CATHETERIZATION WITH CORONARY/GRAFT ANGIOGRAM N/A 10/12/2012   Procedure: LEFT HEART CATHETERIZATION WITH  Isabel Caprice;  Surgeon: Marykay Lex, MD;  Location: [facility] CATH LAB;  Service: Cardiovascular;  Laterality: N/A;   LOOP RECORDER IMPLANT N/A 10/15/2012   Procedure: LOOP RECORDER IMPLANT;  Surgeon: Thurmon Fair, MD;  Location: [facility] CATH LAB;  Service: Cardiovascular;  Laterality: N/A;   LOOP RECORDER REMOVAL  07/16/2017   LOOP RECORDER REMOVAL N/A 07/16/2017   Procedure: LOOP RECORDER REMOVAL;  Surgeon: Thurmon Fair, MD;  Location: [facility] INVASIVE CV LAB;  Service: Cardiovascular;  Laterality: N/A;   NM MYOVIEW LTD  100510   post stress left ventricle is normal in size, post stress ejection fraction is 67% global left ventricular systolic function is normal, normal myocardial perfusion study, abnormal myocardial perfusion study, low risk scan   PACEMAKER IMPLANT N/A 07/16/2017   Procedure: PACEMAKER IMPLANT;  Surgeon: Thurmon Fair, MD;  Location: [facility] INVASIVE CV LAB;  Service: Cardiovascular;  Laterality: N/A;   THYROIDECTOMY      Allergies  Allergies  Allergen Reactions   Penicillins Hives    Did it involve swelling of the face/tongue/throat, SOB, or low BP? No Did it involve sudden or severe rash/hives, skin peeling, or any reaction on the inside of your mouth or nose? No Did you need to seek medical attention at a hospital or doctor's office? No When did it last happen? Unk    If all above answers are "NO", may proceed with cephalosporin use.    Sulfa Antibiotics Hives and Swelling   Gabapentin     Confusion  Lyrica [Pregabalin]     Confusion     Labs/Other Studies Reviewed    The following studies were reviewed today:  Cardiac Studies & Procedures     STRESS TESTS  NM MYOCAR MULTI W/SPECT W 11/29/2008   ECHOCARDIOGRAM  ECHOCARDIOGRAM COMPLETE 05/21/2022  Narrative ECHOCARDIOGRAM REPORT    Patient Name:   Stephanie Atkinson Date of Exam: 05/21/2022 Medical Rec #:  [id number]     Height:       62.0 in Accession #:    [id number]    Weight:       141.4 lb Date  of Birth:  1924/02/27     BSA:          1.650 m Patient Age:    89 years      BP:           150/80 mmHg Patient Gender: F             HR:           77 bpm. Exam Location:  [address]  Procedure: 2D Echo, Cardiac Doppler and Color Doppler  Indications:    I50.42 CHF  History:        Patient has prior history of Echocardiogram examinations, most recent 12/11/2021. CAD and Previous Myocardial Infarction, Prior CABG and Pacemaker, CKD, Arrythmias:Atrial Fibrillation; Risk Factors:Hypertension and HLD.  Sonographer:    Clearence Ped RCS Referring Phys: [phone] MIHAI CROITORU  IMPRESSIONS   1. Left ventricular ejection fraction, by estimation, is 45 to 50%. The left ventricle has mildly decreased function. The left ventricle demonstrates global hypokinesis. Left ventricular diastolic function could not be evaluated. 2. Right ventricular systolic function is normal. The right ventricular size is normal. There is normal pulmonary artery systolic pressure. The estimated right ventricular systolic pressure is 21.5 mmHg. 3. Left atrial size was severely dilated. 4. Right atrial size was severely dilated. 5. The mitral valve is normal in structure. Mild to moderate mitral valve regurgitation. 6. Tricuspid valve regurgitation is mild to moderate. 7. The aortic valve is tricuspid. Aortic valve regurgitation is not visualized. No aortic stenosis is present. 8. The inferior vena cava is normal in size with greater than 50% respiratory variability, suggesting right atrial pressure of 3 mmHg.  Comparison(s): Prior images reviewed side by side. The left ventricular function has improved.  FINDINGS Left Ventricle: Left ventricular ejection fraction, by estimation, is 45 to 50%. The left ventricle has mildly decreased function. The left ventricle demonstrates global hypokinesis. The left ventricular internal cavity size was normal in size. There is borderline concentric left ventricular hypertrophy.  Abnormal (paradoxical) septal motion, consistent with RV pacemaker. Left ventricular diastolic function could not be evaluated due to atrial fibrillation. Left ventricular diastolic function could not be evaluated.  Right Ventricle: The right ventricular size is normal. No increase in right ventricular wall thickness. Right ventricular systolic function is normal. There is normal pulmonary artery systolic pressure. The tricuspid regurgitant velocity is 2.15 m/s, and with an assumed right atrial pressure of 3 mmHg, the estimated right ventricular systolic pressure is 21.5 mmHg.  Left Atrium: Left atrial size was severely dilated.  Right Atrium: Right atrial size was severely dilated. Prominent Eustachian valve.  Pericardium: There is no evidence of pericardial effusion.  Mitral Valve: The mitral valve is normal in structure. Mild mitral annular calcification. Mild to moderate mitral valve regurgitation, with centrally-directed jet.  Tricuspid Valve: The tricuspid valve is normal in structure. Tricuspid valve regurgitation is mild to moderate.  Aortic  Valve: The aortic valve is tricuspid. Aortic valve regurgitation is not visualized. No aortic stenosis is present.  Pulmonic Valve: The pulmonic valve was normal in structure. Pulmonic valve regurgitation is trivial.  Aorta: The aortic root and ascending aorta are structurally normal, with no evidence of dilitation.  Venous: The inferior vena cava is normal in size with greater than 50% respiratory variability, suggesting right atrial pressure of 3 mmHg.  IAS/Shunts: No atrial level shunt detected by color flow Doppler.  Additional Comments: A device lead is visualized in the right ventricle.   LEFT VENTRICLE PLAX 2D LVIDd:         4.60 cm   Diastology LVIDs:         3.40 cm   LV e' medial:    5.27 cm/s LV PW:         1.10 cm   LV E/e' medial:  14.5 LV IVS:        1.20 cm   LV e' lateral:   9.57 cm/s LVOT diam:     2.00 cm   LV E/e'  lateral: 8.0 LV SV:         41 LV SV Index:   25 LVOT Area:     3.14 cm   RIGHT VENTRICLE RV Basal diam:  3.70 cm RV S prime:     7.87 cm/s TAPSE (M-mode): 1.6 cm RVSP:           21.5 mmHg  LEFT ATRIUM              Index        RIGHT ATRIUM           Index LA diam:        4.90 cm  2.97 cm/m   RA Pressure: 3.00 mmHg LA Vol (A2C):   101.0 ml 61.22 ml/m  RA Area:     22.00 cm LA Vol (A4C):   54.9 ml  33.28 ml/m  RA Volume:   63.10 ml  38.25 ml/m LA Biplane Vol: 76.9 ml  46.61 ml/m AORTIC VALVE LVOT Vmax:   69.50 cm/s LVOT Vmean:  44.400 cm/s LVOT VTI:    0.131 m  AORTA Ao Root diam: 3.00 cm Ao Asc diam:  3.30 cm  MITRAL VALVE               TRICUSPID VALVE MV Area (PHT):             TR Peak grad:   18.5 mmHg MV Decel Time:             TR Vmax:        215.00 cm/s MV E velocity: 76.25 cm/s  Estimated RAP:  3.00 mmHg RVSP:           21.5 mmHg  SHUNTS Systemic VTI:  0.13 m Systemic Diam: 2.00 cm  Thurmon Fair MD Electronically signed by Thurmon Fair MD Signature Date/Time: 05/22/2022/11:22:43 AM    Final            Recent Labs: 11/14/2022: ALT 32; BUN 21; Creatinine, Ser 1.31; Hemoglobin 13.3; Platelets 136; Potassium 4.1; Sodium 136  Recent Lipid Panel    Component Value Date/Time   CHOL 127 11/25/2016 1005   TRIG 117 11/25/2016 1005   HDL 46 11/25/2016 1005   CHOLHDL 2.8 11/25/2016 1005   CHOLHDL 2.6 01/05/2015 1118   VLDL 18 01/05/2015 1118   LDLCALC 58 11/25/2016 1005    History of Present Illness    89 year old female with  the above past medical history including CAD s/p CABG x4 in 2006, s/p DES-PDA in 2012, paroxysmal atrial fibrillation, cHFmrEF, mitral valve regurgitation, tricuspid valve regurgitation, syncope s/p ILR in 2014, symptomatic bradycardia s/p permanent transvenous pacemaker in 2019, hypertension, hyperlipidemia, CKD stage III, and hypothyroidism.   She has a history of CAD s/p CABG x4 in 2006 with subsequent DES-PDA in July 2012.   This was patent on follow-up cath in October 2012.  Repeat cardiac catheterization in 2014 showed stable coronary anatomy, medical therapy was advised.  She has a history of paroxysmal atrial fibrillation since the time of her CABG.  She was previously on amiodarone, however, this was discontinued in the setting of prolonged PR interval.  She is on Eliquis for chronic anticoagulation.  She had a syncopal episode in 09/2012.  She underwent ILR implantation.  She was later noted to have fatigue and a slow ventricular response to her atrial fibrillation despite being on no negative chronotropic drugs.  Therefore, she underwent permanent transvenous pacemaker implantation by Dr. Royann Shivers in 06/2017.  She was hospitalized in October 2023 in the setting of acute on chronic systolic heart failure.  Echocardiogram showed moderately reduced LV systolic function, EF 40 to 45%.  She was lat seen in the office on 05/20/2022 and was stable overall from a cardiac standpoint.  Repeat echocardiogram in 04/2022 showed EF improved to 45 to 50%, mildly decreased LV function, normal RV, severe BAE, mild to moderate mitral valve regurgitation, mild to moderate tricuspid valve regurgitation.   She presents today for follow-up accompanied by her daughter. Since her last visit she has yet been stable from a cardiac standpoint.  She has noted a productive cough when she lays down to sleep at night, mild orthopnea, nonpitting bilateral lower extremity edema.  She denies exertional dyspnea, chest pain, palpitations, dizziness, PND, weight gain.  Home Medications    Current Outpatient Medications  Medication Sig Dispense Refill   acetaminophen (TYLENOL) 500 MG tablet Take 1 tablet (500 mg total) by mouth every 6 (six) hours as needed for moderate pain. (Patient taking differently: Take 1,000 mg by mouth every 6 (six) hours as needed for moderate pain (pain score 4-6).) 30 tablet 0   atorvastatin (LIPITOR) 80 MG tablet Take 80 mg by mouth  at bedtime.      carvedilol (COREG) 3.125 MG tablet Take 1 tablet (3.125 mg total) by mouth 2 (two) times daily. 180 tablet 3   ELIQUIS 2.5 MG TABS tablet TAKE 1 TABLET BY MOUTH TWICE A DAY 180 tablet 1   empagliflozin (JARDIANCE) 10 MG TABS tablet Take 1 tablet (10 mg total) by mouth daily before breakfast. 90 tablet 3   furosemide (LASIX) 40 MG tablet Take 1 tablet (40 mg total) by mouth daily. 90 tablet 3   levothyroxine (SYNTHROID, LEVOTHROID) 50 MCG tablet Take 50 mcg by mouth daily before breakfast.      Polyethyl Glycol-Propyl Glycol (SYSTANE OP) Place 1 drop into both eyes daily as needed (dry eye).     sacubitril-valsartan (ENTRESTO) 49-51 MG Take 1 tablet by mouth 2 (two) times daily. 180 tablet 3   traMADol (ULTRAM) 50 MG tablet Take 1 tablet (50 mg total) by mouth 2 (two) times daily. (Patient taking differently: Take 50 mg by mouth 3 (three) times daily.) 30 tablet 0   VITAMIN D3 50 MCG (2000 UT) capsule Take 1 capsule by mouth daily.     azithromycin (ZITHROMAX) 250 MG tablet Take 1 tablet (250 mg total) by mouth  daily. Take first 2 tablets together, then 1 every day until finished. (Patient not taking: Reported on 01/06/2023) 6 tablet 0   cyclobenzaprine (FLEXERIL) 10 MG tablet Take 5-10 mg by mouth daily as needed for muscle spasms. (Patient not taking: Reported on 01/06/2023)     diclofenac Sodium (VOLTAREN) 1 % GEL Apply 4 g topically 4 (four) times daily. (Patient not taking: Reported on 01/06/2023) 100 g 0   diphenhydrAMINE (BENADRYL) 25 MG tablet Take 25 mg by mouth every 6 (six) hours as needed for allergies. (Patient not taking: Reported on 01/06/2023)     gabapentin (NEURONTIN) 300 MG capsule Take 300 mg by mouth daily. (Patient not taking: Reported on 01/06/2023)     HYDROcodone-acetaminophen (NORCO) 5-325 MG tablet Take 1 tablet by mouth every 6 (six) hours as needed. (Patient not taking: Reported on 01/06/2023) 20 tablet 0   lidocaine (LIDODERM) 5 % Place 1 patch onto the  skin daily. Remove & Discard patch within 12 hours or as directed by MD (Patient not taking: Reported on 01/06/2023) 30 patch 0   nitroGLYCERIN (NITROSTAT) 0.4 MG SL tablet Place 1 tablet (0.4 mg total) under the tongue every 5 (five) minutes as needed for chest pain. (Patient not taking: Reported on 01/06/2023) 25 tablet 6   predniSONE (STERAPRED UNI-PAK 21 TAB) 10 MG (21) TBPK tablet Per package instructions (Patient not taking: Reported on 01/06/2023) 1 each 0   No current facility-administered medications for this visit.     Review of Systems    She denies chest pain, palpitations, dyspnea, pnd, n, v, dizziness, syncope, weight gain, or early satiety. All other systems reviewed and are otherwise negative except as noted above.   Physical Exam    VS:  BP (!) 140/58 (BP Location: Left Arm, Patient Position: Sitting, Cuff Size: Normal)   Pulse 62   Ht 5\' 2"  (1.575 m)   Wt 135 lb 6.4 oz (61.4 kg)   SpO2 98%   BMI 24.76 kg/m   GEN: Well nourished, well developed, in no acute distress. HEENT: normal. Neck: Supple, no JVD, carotid bruits, or masses. Cardiac: RRR, no murmurs, rubs, or gallops. No clubbing, cyanosis, edema.  Radials/DP/PT 2+ and equal bilaterally.  Respiratory:  Respirations regular and unlabored, clear to auscultation bilaterally. GI: Soft, nontender, nondistended, BS + x 4. MS: no deformity or atrophy. Skin: warm and dry, no rash. Neuro:  Strength and sensation are intact. Psych: Normal affect.  Accessory Clinical Findings    ECG personally reviewed by me today - EKG Interpretation Date/Time:  Monday January 06 2023 10:55:19 EST Ventricular Rate:  62 PR Interval:    QRS Duration:  144 QT Interval:  442 QTC Calculation: 448 R Axis:   -58  Text Interpretation: Ventricular-paced rhythm When compared with ECG of 18-Aug-2022 11:04, PREVIOUS ECG IS PRESENT Confirmed by Bernadene Person ([id number]) on 01/06/2023 10:57:31 AM  .   Lab Results  Component Value Date   WBC  5.1 11/14/2022   HGB 13.3 11/14/2022   HCT 42.0 11/14/2022   MCV 91.1 11/14/2022   PLT 136 (L) 11/14/2022   Lab Results  Component Value Date   CREATININE 1.31 (H) 11/14/2022   BUN 21 11/14/2022   NA 136 11/14/2022   K 4.1 11/14/2022   CL 109 11/14/2022   CO2 22 11/14/2022   Lab Results  Component Value Date   ALT 32 11/14/2022   AST 31 11/14/2022   ALKPHOS 60 11/14/2022   BILITOT 0.9 11/14/2022   Lab Results  Component Value Date   CHOL 127 11/25/2016   HDL 46 11/25/2016   LDLCALC 58 11/25/2016   TRIG 117 11/25/2016   CHOLHDL 2.8 11/25/2016    No results found for: "HGBA1C"  Assessment & Plan   1. HFmrEF:  Most recent in 04/2022 showed EF improved to 45 to 50%, mildly decreased LV function, normal RV, severe BAE, mild to moderate mitral valve regurgitation, mild to moderate tricuspid valve regurgitation.  Recent concern for fluid overload with productive cough, nonpitting bilateral lower extremity edema, mild orthopnea.  She noticed mild improvement in symptoms when taking an additional Lasix 20 mg.  Will increase Lasix to 40 mg daily.  Will check BMET in 2 weeks.  Reviewed daily weights, sodium and fluid recommendations. Continue Entresto, carvedilol, Jardiance, Lasix.  2. CAD: CAD s/p CABG x4 in 2006 with subsequent DES-PDA in July 2012. Repeat cardiac catheterization in 2014 showed stable coronary anatomy.  Stable with no anginal symptoms. Continue spironolactone, Lasix, and Lipitor.  Not on ASA in the setting of chronic DOAC therapy.  Continue current medications as above, Lipitor.  3. Paroxysmal atrial fibrillation with slow ventricular response: S/p permanent transvenous pacemaker implantation by Dr. Royann Shivers in 06/2017 in the setting of symptomatic bradycardia.  EKG today shows V-paced rhythm. She denies bleeding on Eliquis. Continue carvedilol, Eliquis.   4. H/o syncope/symptomatic bradycardia: S/p ILR in 2014-explanted at the time of pacemaker insertion. Denies any  recurrent syncope.  Most recent device interrogation shows atrial fibrillation with slow ventricular response, 100% ventricular paced rhythm, rare and brief NSVT.   5. Valvular heart disease: Most recent echocardiogram showed mild to moderate mitral valve regurgitation, mild-moderate tricuspid regurgitation.  Continue diuresis as above.    6. Hypertension: BP well controlled. Continue current antihypertensive regimen.    7. Hyperlipidemia: LDL was 65 in 06/2022.  Continue Lipitor.   8. CKD stage III: Creatinine was 1.17 in 11/2022. Follows with nephrology.    9. Hypothyroidism:  TSH was 1.602 in 11/2021.  Monitored and managed per PCP.    10. Disposition: Follow-up as scheduled with Dr. Royann Shivers in 01/2023, 6 months with Dr. Allyson Sabal.   Joylene Grapes, NP 01/06/2023, 1:29 PM

## 2023-01-06 NOTE — Telephone Encounter (Signed)
Left a detailed message on machine. Pt was seen in office and given fasting lipid panel order for 2 weeks. Fasting lipid panel has been canceled and new order placed for BMET needed in 2 weeks. New order mailed to pt. Pts daughter advised to call back if she has any questions or concerns.

## 2023-01-08 ENCOUNTER — Encounter (HOSPITAL_COMMUNITY): Payer: Self-pay | Admitting: Emergency Medicine

## 2023-01-08 ENCOUNTER — Emergency Department (HOSPITAL_COMMUNITY): Payer: Medicare PPO

## 2023-01-08 ENCOUNTER — Other Ambulatory Visit: Payer: Self-pay

## 2023-01-08 ENCOUNTER — Observation Stay (HOSPITAL_COMMUNITY)
Admission: EM | Admit: 2023-01-08 | Discharge: 2023-01-10 | Disposition: A | Discharge status: Home / Self Care | Payer: Medicare PPO | Attending: Internal Medicine | Admitting: Internal Medicine

## 2023-01-08 DIAGNOSIS — Z951 Presence of aortocoronary bypass graft: Secondary | ICD-10-CM | POA: Insufficient documentation

## 2023-01-08 DIAGNOSIS — Z950 Presence of cardiac pacemaker: Secondary | ICD-10-CM | POA: Diagnosis not present

## 2023-01-08 DIAGNOSIS — I130 Hypertensive heart and chronic kidney disease with heart failure and stage 1 through stage 4 chronic kidney disease, or unspecified chronic kidney disease: Secondary | ICD-10-CM | POA: Insufficient documentation

## 2023-01-08 DIAGNOSIS — I5023 Acute on chronic systolic (congestive) heart failure: Secondary | ICD-10-CM | POA: Diagnosis present

## 2023-01-08 DIAGNOSIS — I5032 Chronic diastolic (congestive) heart failure: Secondary | ICD-10-CM

## 2023-01-08 DIAGNOSIS — I5033 Acute on chronic diastolic (congestive) heart failure: Secondary | ICD-10-CM | POA: Diagnosis not present

## 2023-01-08 DIAGNOSIS — R262 Difficulty in walking, not elsewhere classified: Secondary | ICD-10-CM | POA: Diagnosis not present

## 2023-01-08 DIAGNOSIS — Z7901 Long term (current) use of anticoagulants: Secondary | ICD-10-CM | POA: Insufficient documentation

## 2023-01-08 DIAGNOSIS — R42 Dizziness and giddiness: Secondary | ICD-10-CM | POA: Diagnosis not present

## 2023-01-08 DIAGNOSIS — N179 Acute kidney failure, unspecified: Secondary | ICD-10-CM

## 2023-01-08 DIAGNOSIS — I517 Cardiomegaly: Secondary | ICD-10-CM | POA: Diagnosis not present

## 2023-01-08 DIAGNOSIS — I4821 Permanent atrial fibrillation: Secondary | ICD-10-CM | POA: Diagnosis present

## 2023-01-08 DIAGNOSIS — E785 Hyperlipidemia, unspecified: Secondary | ICD-10-CM | POA: Diagnosis present

## 2023-01-08 DIAGNOSIS — Z79899 Other long term (current) drug therapy: Secondary | ICD-10-CM | POA: Insufficient documentation

## 2023-01-08 DIAGNOSIS — I25810 Atherosclerosis of coronary artery bypass graft(s) without angina pectoris: Secondary | ICD-10-CM | POA: Diagnosis present

## 2023-01-08 DIAGNOSIS — N1832 Chronic kidney disease, stage 3b: Secondary | ICD-10-CM | POA: Diagnosis not present

## 2023-01-08 DIAGNOSIS — I10 Essential (primary) hypertension: Secondary | ICD-10-CM | POA: Diagnosis not present

## 2023-01-08 DIAGNOSIS — I2510 Atherosclerotic heart disease of native coronary artery without angina pectoris: Secondary | ICD-10-CM | POA: Insufficient documentation

## 2023-01-08 DIAGNOSIS — E039 Hypothyroidism, unspecified: Secondary | ICD-10-CM | POA: Diagnosis not present

## 2023-01-08 DIAGNOSIS — Z955 Presence of coronary angioplasty implant and graft: Secondary | ICD-10-CM | POA: Insufficient documentation

## 2023-01-08 DIAGNOSIS — G9389 Other specified disorders of brain: Secondary | ICD-10-CM | POA: Diagnosis not present

## 2023-01-08 DIAGNOSIS — R55 Syncope and collapse: Principal | ICD-10-CM | POA: Diagnosis present

## 2023-01-08 DIAGNOSIS — R7989 Other specified abnormal findings of blood chemistry: Secondary | ICD-10-CM

## 2023-01-08 LAB — CBC WITH DIFFERENTIAL/PLATELET
Abs Immature Granulocytes: 0.02 10*3/uL (ref 0.00–0.07)
Basophils Absolute: 0 10*3/uL (ref 0.0–0.1)
Basophils Relative: 0 %
Eosinophils Absolute: 0 10*3/uL (ref 0.0–0.5)
Eosinophils Relative: 0 %
HCT: 42.8 % (ref 36.0–46.0)
Hemoglobin: 13.6 g/dL (ref 12.0–15.0)
Immature Granulocytes: 1 %
Lymphocytes Relative: 25 %
Lymphs Abs: 1 10*3/uL (ref 0.7–4.0)
MCH: 29.2 pg (ref 26.0–34.0)
MCHC: 31.8 g/dL (ref 30.0–36.0)
MCV: 92 fL (ref 80.0–100.0)
Monocytes Absolute: 0.3 10*3/uL (ref 0.1–1.0)
Monocytes Relative: 8 %
Neutro Abs: 2.6 10*3/uL (ref 1.7–7.7)
Neutrophils Relative %: 66 %
Platelets: 173 10*3/uL (ref 150–400)
RBC: 4.65 MIL/uL (ref 3.87–5.11)
RDW: 14.7 % (ref 11.5–15.5)
WBC: 3.9 10*3/uL — ABNORMAL LOW (ref 4.0–10.5)
nRBC: 0 % (ref 0.0–0.2)

## 2023-01-08 LAB — URINALYSIS, ROUTINE W REFLEX MICROSCOPIC
Bacteria, UA: NONE SEEN
Bilirubin Urine: NEGATIVE
Glucose, UA: >=500 mg/dL — AB
Hgb urine dipstick: NEGATIVE
Ketones, ur: NEGATIVE mg/dL
Leukocytes,Ua: NEGATIVE
Nitrite: NEGATIVE
Protein, ur: NEGATIVE mg/dL
Specific Gravity, Urine: 1.006 (ref 1.005–1.030)
pH: 6 (ref 5.0–8.0)

## 2023-01-08 LAB — BASIC METABOLIC PANEL
Anion gap: 7 (ref 5–15)
BUN: 15 mg/dL (ref 8–23)
CO2: 22 mmol/L (ref 22–32)
Calcium: 10.6 mg/dL — ABNORMAL HIGH (ref 8.9–10.3)
Chloride: 110 mmol/L (ref 98–111)
Creatinine, Ser: 1.39 mg/dL — ABNORMAL HIGH (ref 0.44–1.00)
GFR, Estimated: 35 mL/min — ABNORMAL LOW (ref >60)
Glucose, Bld: 89 mg/dL (ref 70–99)
Potassium: 4 mmol/L (ref 3.5–5.1)
Sodium: 139 mmol/L (ref 135–145)

## 2023-01-08 LAB — MAGNESIUM: Magnesium: 2.1 mg/dL (ref 1.7–2.4)

## 2023-01-08 LAB — BRAIN NATRIURETIC PEPTIDE: B Natriuretic Peptide: 273.7 pg/mL — ABNORMAL HIGH (ref 0.0–100.0)

## 2023-01-08 LAB — TROPONIN I (HIGH SENSITIVITY)
Troponin I (High Sensitivity): 22 ng/L — ABNORMAL HIGH (ref <18)
Troponin I (High Sensitivity): 23 ng/L — ABNORMAL HIGH (ref <18)

## 2023-01-08 LAB — TSH: TSH: 0.943 u[IU]/mL (ref 0.350–4.500)

## 2023-01-08 MED ORDER — ALBUTEROL SULFATE (2.5 MG/3ML) 0.083% IN NEBU
2.5000 mg | INHALATION_SOLUTION | Freq: Four times a day (QID) | RESPIRATORY_TRACT | Status: DC | PRN
Start: 2023-01-08 — End: 2023-01-10

## 2023-01-08 MED ORDER — SODIUM CHLORIDE 0.9% FLUSH
3.0000 mL | Freq: Two times a day (BID) | INTRAVENOUS | Status: DC
  Administered 2023-01-08 – 2023-01-10 (×4): 3 mL via INTRAVENOUS

## 2023-01-08 MED ORDER — APIXABAN 2.5 MG PO TABS
2.5000 mg | ORAL_TABLET | Freq: Two times a day (BID) | ORAL | Status: DC
  Administered 2023-01-08 – 2023-01-10 (×4): 2.5 mg via ORAL
  Filled 2023-01-08 (×4): qty 1

## 2023-01-08 MED ORDER — ACETAMINOPHEN 325 MG PO TABS
650.0000 mg | ORAL_TABLET | Freq: Four times a day (QID) | ORAL | Status: DC | PRN
  Administered 2023-01-08 – 2023-01-09 (×2): 650 mg via ORAL
  Filled 2023-01-08 (×2): qty 2

## 2023-01-08 MED ORDER — MECLIZINE HCL 12.5 MG PO TABS: 12.5000 mg | ORAL_TABLET | Freq: Two times a day (BID) | ORAL | 0 refills | Status: DC | PRN

## 2023-01-08 MED ORDER — TRAMADOL HCL 50 MG PO TABS
50.0000 mg | ORAL_TABLET | Freq: Once | ORAL | Status: AC
  Administered 2023-01-08: 50 mg via ORAL
  Filled 2023-01-08: qty 1

## 2023-01-08 MED ORDER — MECLIZINE HCL 25 MG PO TABS
12.5000 mg | ORAL_TABLET | Freq: Once | ORAL | Status: AC
  Administered 2023-01-08: 12.5 mg via ORAL
  Filled 2023-01-08: qty 1

## 2023-01-08 MED ORDER — ACETAMINOPHEN 650 MG RE SUPP: 650.0000 mg | Freq: Four times a day (QID) | RECTAL | Status: DC | PRN

## 2023-01-08 MED ORDER — FUROSEMIDE 10 MG/ML IJ SOLN
40.0000 mg | Freq: Once | INTRAMUSCULAR | Status: AC
Start: 2023-01-08 — End: 2023-01-08
  Administered 2023-01-08: 40 mg via INTRAVENOUS
  Filled 2023-01-08: qty 4

## 2023-01-08 MED ORDER — MELATONIN 5 MG PO TABS
5.0000 mg | ORAL_TABLET | Freq: Every evening | ORAL | Status: DC | PRN
  Administered 2023-01-08: 5 mg via ORAL
  Filled 2023-01-08: qty 1

## 2023-01-08 MED ORDER — GUAIFENESIN ER 600 MG PO TB12
600.0000 mg | ORAL_TABLET | Freq: Two times a day (BID) | ORAL | Status: DC
  Administered 2023-01-08 – 2023-01-10 (×5): 600 mg via ORAL
  Filled 2023-01-08 (×5): qty 1

## 2023-01-08 NOTE — Consult Note (Signed)
CONSULTATION NOTE   Patient Name: Stephanie Atkinson Date of Encounter: 01/08/2023 Cardiologist: Nanetta Batty, MD Electrophysiologist: None Advanced Heart Failure: None   Chief Complaint   Near syncope  Patient Profile   89 yo female with history of CAD s/p CABG, PAF, CKD3, permanent afib s/p PPM who presents with near syncope  HPI   Stephanie Atkinson is a 89 y.o. female who is being seen today for the evaluation of near syncope at the request of Dr. Katrinka Blazing. This is a very pleasant 89 year old female who I last took care of about 12 years ago.  She is followed by Dr. Allyson Sabal and Dr. Royann Shivers and has a history of paroxysmal atrial fibrillation, cardiac pacemaker, coronary artery disease status post CABG, prior stenting, hypertension and dyslipidemia and other medical problems.  She presents now with a near syncopal episode.  In fact she has had several episodes when changing position particularly sitting up quickly.  This is worsened over the past several days.  She was seen in the office 2 days ago for symptoms of nonproductive cough, shortness of breath laying down and some lower extremity swelling.  This was thought to be acute on chronic systolic congestive heart failure.  Her last echo showed LVEF 45 to 50%.  In the office, however in March she was felt to be euvolemic.  Plan was to increase her Lasix over the past several days which she did although she has been feeling this positional dizziness and swimmy headedness mostly at night or when sitting upward.  Blood pressure is emergency department is quite elevated with systolics between 170 and 190.  There is concern for possible orthostasis although have not seen orthostatic vitals.  BNP was minimally elevated in the 200s but higher than previous studies and she was given additional Lasix for this.  Troponin was mild flat elevated at 22 and 23.  Urinalysis was negative.  Chest x-ray was unremarkable.  She did have remote pacemaker interrogation  in the ER which showed essentially 100% pacing with no evidence of any arrhythmias.  PMHx   Past Medical History:  Diagnosis Date   Chronic kidney disease, stage 3 (HCC)    Coronary artery disease    Hx of CABG 2006   LIMA-LAD, free RIMA-PDA, Radial-OM1-OM2   Hyperlipidemia    Hypertension    Hypothyroid    MI, old        PAF (paroxysmal atrial fibrillation) (HCC)    was on amio, d/c'd due to prolonged PR interval, rate control   Presence of permanent cardiac pacemaker 07/16/2017   Presence of stent in right coronary artery 07/12   RIMA-PDA occluded, DES stent placed   Syncope 07/12   bradycardic, beta blocker decreased, also felt to be dehydrated    Past Surgical History:  Procedure Laterality Date   CARDIAC CATHETERIZATION  101512   dr. Onalee Hua harding, revealing a atretic bypass to her right with patent distal RCA stent   CAROTID STENT  2012   CORONARY ARTERY BYPASS GRAFT  2006   LIMA-LAD, free RIMA-PDA, Radial-OM1-OM2   DOPPLER ECHOCARDIOGRAPHY  161096   mild asymmetric left ventricular hypertrophy, left ventricular systolic function is low normal, ejection fraction = 50-55%, the LA is moderate dilated, the RA is mildly dilated, no significant valvular disease   INSERT / REPLACE / REMOVE PACEMAKER  07/16/2017   JOINT REPLACEMENT     hip replacement   LEFT HEART CATHETERIZATION WITH CORONARY/GRAFT ANGIOGRAM N/A 10/12/2012   Procedure: LEFT HEART  CATHETERIZATION WITH Isabel Caprice;  Surgeon: Marykay Lex, MD;  Location: [facility];  Service: Cardiovascular;  Laterality: N/A;   LOOP RECORDER IMPLANT N/A 10/15/2012   Procedure: LOOP RECORDER IMPLANT;  Surgeon: Thurmon Fair, MD;  Location: [facility] CATH LAB;  Service: Cardiovascular;  Laterality: N/A;   LOOP RECORDER REMOVAL  07/16/2017   LOOP RECORDER REMOVAL N/A 07/16/2017   Procedure: LOOP RECORDER REMOVAL;  Surgeon: Thurmon Fair, MD;  Location: [facility] INVASIVE CV LAB;  Service: Cardiovascular;  Laterality: N/A;   NM  MYOVIEW LTD  100510   post stress left ventricle is normal in size, post stress ejection fraction is 67% global left ventricular systolic function is normal, normal myocardial perfusion study, abnormal myocardial perfusion study, low risk scan   PACEMAKER IMPLANT N/A 07/16/2017   Procedure: PACEMAKER IMPLANT;  Surgeon: Thurmon Fair, MD;  Location: [facility] INVASIVE CV LAB;  Service: Cardiovascular;  Laterality: N/A;   THYROIDECTOMY      FAMHx   Family History  Problem Relation Age of Onset   CAD Mother    CAD Maternal Grandmother     SOCHx    reports that she has never smoked. Her smokeless tobacco use includes snuff. She reports that she does not drink alcohol and does not use drugs.  Outpatient Medications   No current facility-administered medications on file prior to encounter.   Current Outpatient Medications on File Prior to Encounter  Medication Sig Dispense Refill   acetaminophen (TYLENOL) 500 MG tablet Take 1 tablet (500 mg total) by mouth every 6 (six) hours as needed for moderate pain. (Patient taking differently: Take 1,000 mg by mouth 3 (three) times daily.) 30 tablet 0   atorvastatin (LIPITOR) 80 MG tablet Take 80 mg by mouth at bedtime.      carvedilol (COREG) 3.125 MG tablet Take 1 tablet (3.125 mg total) by mouth 2 (two) times daily. 180 tablet 3   ELIQUIS 2.5 MG TABS tablet TAKE 1 TABLET BY MOUTH TWICE A DAY 180 tablet 1   empagliflozin (JARDIANCE) 10 MG TABS tablet Take 1 tablet (10 mg total) by mouth daily before breakfast. 90 tablet 3   furosemide (LASIX) 40 MG tablet Take 1 tablet (40 mg total) by mouth daily. 90 tablet 3   levothyroxine (SYNTHROID, LEVOTHROID) 50 MCG tablet Take 50 mcg by mouth daily before breakfast.      Polyethyl Glycol-Propyl Glycol (SYSTANE OP) Place 1 drop into both eyes daily as needed (dry eye).     sacubitril-valsartan (ENTRESTO) 49-51 MG Take 1 tablet by mouth 2 (two) times daily. 180 tablet 3   traMADol (ULTRAM) 50 MG tablet Take 1  tablet (50 mg total) by mouth 2 (two) times daily. (Patient taking differently: Take 50 mg by mouth 3 (three) times daily.) 30 tablet 0   VITAMIN D3 50 MCG (2000 UT) capsule Take 1 capsule by mouth once a week.     gabapentin (NEURONTIN) 300 MG capsule Take 300 mg by mouth daily. (Patient not taking: Reported on 01/06/2023)     nitroGLYCERIN (NITROSTAT) 0.4 MG SL tablet Place 1 tablet (0.4 mg total) under the tongue every 5 (five) minutes as needed for chest pain. (Patient not taking: Reported on 01/06/2023) 25 tablet 6    Inpatient Medications    Scheduled Meds:  apixaban  2.5 mg Oral BID   guaiFENesin  600 mg Oral BID   sodium chloride flush  3 mL Intravenous Q12H    Continuous Infusions:   PRN Meds: acetaminophen **OR** acetaminophen, albuterol  ALLERGIES   Allergies  Allergen Reactions   Penicillins Hives   Sulfa Antibiotics Hives and Swelling   Gabapentin Other (See Comments)    Confusion   Lyrica [Pregabalin] Other (See Comments)    Confusion    ROS   Pertinent items noted in HPI and remainder of comprehensive ROS otherwise negative.  Vitals   Vitals:   01/08/23 1300 01/08/23 1452 01/08/23 1600 01/08/23 1808  BP: (!) 160/66  (!) 172/70 (!) 192/81  Pulse: (!) 59  (!) 58 74  Resp: 16  17 20   Temp:  97.8 F (36.6 C)    TempSrc:  Oral    SpO2: 99%  98% 97%   No intake or output data in the 24 hours ending 01/08/23 1813 There were no vitals filed for this visit.  Physical Exam   General appearance: alert and no distress Neck: no carotid bruit, no JVD, and thyroid not enlarged, symmetric, no tenderness/mass/nodules Lungs: clear to auscultation bilaterally Heart: regular rate and rhythm, S1, S2 normal, no murmur, click, rub or gallop Abdomen: soft, non-tender; bowel sounds normal; no masses,  no organomegaly Extremities: extremities normal, atraumatic, no cyanosis or edema Pulses: 2+ and symmetric Skin: Skin color, texture, turgor normal. No rashes or  lesions Neurologic: Grossly normal Psych: Pleasant  Labs   Results for orders placed or performed during the hospital encounter of 01/08/23 (from the past 48 hour(s))  Basic metabolic panel     Status: Abnormal   Collection Time: 01/08/23  5:27 AM  Result Value Ref Range   Sodium 139 135 - 145 mmol/L   Potassium 4.0 3.5 - 5.1 mmol/L   Chloride 110 98 - 111 mmol/L   CO2 22 22 - 32 mmol/L   Glucose, Bld 89 70 - 99 mg/dL    Comment: Glucose reference range applies only to samples taken after fasting for at least 8 hours.   BUN 15 8 - 23 mg/dL   Creatinine, Ser 3.08 (H) 0.44 - 1.00 mg/dL   Calcium 65.7 (H) 8.9 - 10.3 mg/dL   GFR, Estimated 35 (L) >60 mL/min    Comment: (NOTE) Calculated using the CKD-EPI Creatinine Equation (2021)    Anion gap 7 5 - 15    Comment: Performed at [facility], [address] [place], [place] [address]  CBC WITH DIFFERENTIAL     Status: Abnormal   Collection Time: 01/08/23  5:27 AM  Result Value Ref Range   WBC 3.9 (L) 4.0 - 10.5 K/uL   RBC 4.65 3.87 - 5.11 MIL/uL   Hemoglobin 13.6 12.0 - 15.0 g/dL   HCT 29.5 28.4 - 13.2 %   MCV 92.0 80.0 - 100.0 fL   MCH 29.2 26.0 - 34.0 pg   MCHC 31.8 30.0 - 36.0 g/dL   RDW 44.0 10.2 - 72.5 %   Platelets 173 150 - 400 K/uL   nRBC 0.0 0.0 - 0.2 %   Neutrophils Relative % 66 %   Neutro Abs 2.6 1.7 - 7.7 K/uL   Lymphocytes Relative 25 %   Lymphs Abs 1.0 0.7 - 4.0 K/uL   Monocytes Relative 8 %   Monocytes Absolute 0.3 0.1 - 1.0 K/uL   Eosinophils Relative 0 %   Eosinophils Absolute 0.0 0.0 - 0.5 K/uL   Basophils Relative 0 %   Basophils Absolute 0.0 0.0 - 0.1 K/uL   Immature Granulocytes 1 %   Abs Immature Granulocytes 0.02 0.00 - 0.07 K/uL    Comment: Performed at [facility], [address], [place], [place] [address]  Troponin I (High Sensitivity)     Status: Abnormal   Collection Time: 01/08/23  5:27 AM  Result Value Ref Range   Troponin I (High Sensitivity) 22 (H) <18 ng/L    Comment:  (NOTE) Elevated high sensitivity troponin I (hsTnI) values and significant  changes across serial measurements may suggest ACS but many other  chronic and acute conditions are known to elevate hsTnI results.  Refer to the "Links" section for chest pain algorithms and additional  guidance. Performed at [facility], [address] [place], [place] [address]   Brain natriuretic peptide     Status: Abnormal   Collection Time: 01/08/23  5:27 AM  Result Value Ref Range   B Natriuretic Peptide 273.7 (H) 0.0 - 100.0 pg/mL    Comment: Performed at [facility], [address], [place], [place] [address]  Magnesium     Status: None   Collection Time: 01/08/23  5:27 AM  Result Value Ref Range   Magnesium 2.1 1.7 - 2.4 mg/dL    Comment: Performed at [facility], [address], [place], [place] [address]  Troponin I (High Sensitivity)     Status: Abnormal   Collection Time: 01/08/23  7:32 AM  Result Value Ref Range   Troponin I (High Sensitivity) 23 (H) <18 ng/L    Comment: (NOTE) Elevated high sensitivity troponin I (hsTnI) values and significant  changes across serial measurements may suggest ACS but many other  chronic and acute conditions are known to elevate hsTnI results.  Refer to the "Links" section for chest pain algorithms and additional  guidance. Performed at [facility], [address] [place], [place] [address]   TSH     Status: None   Collection Time: 01/08/23  7:32 AM  Result Value Ref Range   TSH 0.943 0.350 - 4.500 uIU/mL    Comment: Performed by a 3rd Generation assay with a functional sensitivity of <=0.01 uIU/mL. Performed at [facility], [address], [place], [place] [address]   Urinalysis, Routine w reflex microscopic -Urine, Clean Catch     Status: Abnormal   Collection Time: 01/08/23 10:35 AM  Result Value Ref Range   Color, Urine STRAW (A) YELLOW   APPearance CLEAR CLEAR   Specific Gravity, Urine 1.006 1.005 - 1.030    pH 6.0 5.0 - 8.0   Glucose, UA >=500 (A) NEGATIVE mg/dL   Hgb urine dipstick NEGATIVE NEGATIVE   Bilirubin Urine NEGATIVE NEGATIVE   Ketones, ur NEGATIVE NEGATIVE mg/dL   Protein, ur NEGATIVE NEGATIVE mg/dL   Nitrite NEGATIVE NEGATIVE   Leukocytes,Ua NEGATIVE NEGATIVE   RBC / HPF 0-5 0 - 5 RBC/hpf   WBC, UA 0-5 0 - 5 WBC/hpf   Bacteria, UA NONE SEEN NONE SEEN   Squamous Epithelial / HPF 0-5 0 - 5 /HPF    Comment: Performed at [facility], [address], [place], [place] [address]    ECG   Paced rhythm with underlying A-fib- Personally Reviewed  Telemetry   Paced rhythm- Personally Reviewed  Radiology   DG Chest Port 1 View  Result Date: 01/08/2023 CLINICAL DATA:  89 year old female with syncope, dizziness. EXAM: PORTABLE CHEST 1 VIEW COMPARISON:  Portable chest 08/18/2022 and earlier. FINDINGS: Portable AP semi upright view at 0516 hours. Improved lung volumes. Stable cardiomegaly and mediastinal contours. Prior CABG. Stable left chest single lead cardiac pacemaker.  Chronic lung base hypoinflation. Otherwise Allowing for portable technique the lungs are clear. No pulmonary edema. Stable visualized osseous structures. Negative visible bowel gas. IMPRESSION: Chronic cardiomegaly, CABG, single lead cardiac pacemaker. Chronic but improved lung base hypo ventilation since June. No acute cardiopulmonary abnormality. Electronically Signed   By: Odessa Fleming M.D.   On: 01/08/2023 06:14   CT Head Wo Contrast  Result Date: 01/08/2023 CLINICAL DATA:  Syncope/presyncope with cerebrovascular cause suspected. Dizziness with near syncope episode EXAM: CT HEAD WITHOUT CONTRAST TECHNIQUE: Contiguous axial images were obtained from the base of the skull through the vertex without intravenous contrast. RADIATION DOSE REDUCTION: This exam was performed according to the departmental dose-optimization program which includes automated exposure control, adjustment of the mA and/or kV according to patient  size and/or use of iterative reconstruction technique. COMPARISON:  11/14/2022 FINDINGS: Brain: No evidence of acute infarction, hemorrhage, hydrocephalus, extra-axial collection or mass lesion/mass effect. Small chronic infarcts in the right cerebellum and left thalamus. Ischemic gliosis in the cerebral white matter with mild volume loss. Vascular: No hyperdense vessel or unexpected calcification. Skull: Normal. Negative for fracture or focal lesion. Sinuses/Orbits: No acute finding. IMPRESSION: No acute finding. Chronic small vessel disease. Electronically Signed   By: Tiburcio Pea M.D.   On: 01/08/2023 05:23    Cardiac Studies   N/A  Impression   Principal Problem:   Near syncope Active Problems:   Dyslipidemia   Permanent atrial fibrillation (HCC)   Long term (current) use of anticoagulants   CAD (coronary artery disease) of artery bypass graft   Pacemaker   Acute on chronic systolic CHF (congestive heart failure) (HCC)   Elevated troponin   Hypercalcemia   Hypothyroidism   Recommendation   Stephanie Atkinson has had several near syncopal events.  She is notably hypertensive in the emergency department.  Mildly elevated BNP however she is given additional Lasix.  She has possibly had some symptoms of volume overload recently although appears euvolemic on exam.  I would not give additional Lasix at this time.  Reasonable to check orthostatics.  Will proceed with an echocardiogram tomorrow.  She likely needs better blood pressure control.  Will follow-up on echo results tomorrow.  Thanks for the consultation.  Time Spent Directly with Patient:  I have spent a total of 45 minutes with the patient reviewing hospital notes, telemetry, EKGs, labs and examining the patient as well as establishing an assessment and plan that was discussed personally with the patient.  > 50% of time was spent in direct patient care.  Length of Stay:  LOS: 0 days   Chrystie Nose, MD, [facility], FACP  [facility]  [facility]  Medical Director of the Advanced Lipid Disorders &  Cardiovascular Risk Reduction Clinic Diplomate of the American Board of Clinical Lipidology Attending Cardiologist  Direct Dial: [phone]  Fax: [phone]  Website:  [email or web address] Nicely Marquerite Forsman 01/08/2023, 6:13 PM

## 2023-01-08 NOTE — ED Notes (Signed)
Pt cleaned at this time as well as sheets changed

## 2023-01-08 NOTE — ED Provider Notes (Signed)
Care of the patient assumed at signout.  On my evaluation of the patient she is calm.  Troponins are stable, slightly elevated, largely unchanged.  Patient has evidence for fluid overload status, and family notes the patient recently had increase in her Lasix dosing.  Given her episode of near syncope, increased fluid retention increased BNP in spite of this increased dosing, patient will be admitted.  I have discussed patient's case with her cardiology team will follow as a consulting service.  Attempts at interrogating the patient's pacemaker have been stymied by IT issues.  Cardiology will assist in obtaining interrogation results as well.   Gerhard Munch, MD 01/08/23 1233

## 2023-01-08 NOTE — ED Notes (Signed)
[employer] came in scanned into chart

## 2023-01-08 NOTE — H&P (Addendum)
History and Physical    Patient: Stephanie Atkinson:[id number] DOB: 1924-10-28 DOA: 01/08/2023 DOS: the patient was seen and examined on 01/08/2023 PCP: Linus Galas, NP  Patient coming from: Home via EMS  Chief Complaint:  Chief Complaint  Patient presents with   Near Syncope   HPI: Stephanie Atkinson is a 89 y.o. female with medical history significant of hypertension, hyperlipidemia, CAD s/p Stent, paroxysmal atrial fibrillation, hypothyroidism who presents after having a near syncopal episode.  History is obtained from the patient with assistance of her daughter over the phone.  Apparently this morning around 3:30 a.m she was trying to get out of bed when she felt like she was going to pass out and fell back into the bed.  She had to lay there for second to prior to trying to get up.  Patient denies any loss of consciousness, chest pain, recent fever, chills, nausea, vomiting, or diarrhea symptoms.  She denies having a cough, but reports spitting up fluid over the last 2 weeks.  Symptoms usually occur during the night for which she has difficulty sleeping.  During the day symptoms usually not present.  Patient also reports having chronic arthralgias  Daughter notes that patient had went to see the nurse practitioner cardiology 2 days ago and during that visit her Lasix dose had been increased from 20 mg to 40 mg daily.  Yesterday patient actually had 1 episode earlier where she felt lightheaded, but was able to recover quickly.  Early this morning she had a subsequent episode for which she called EMS  In the emergency department patient was noted to be afebrile with pulse 59-73, respiratory 15-24, blood pressures elevated up to 183/66, and O2 saturation maintained on room air.  Labs significant for WBC and 3.9, creatinine 1.39, calcium 10.6, BNP 273.7, and high-sensitivity troponin 22->23.  Chest x-ray noted chronic cardiomegaly with single lead cardiac pacer and chronic but improved lung base  hypoventilation without acute abnormality.  Urinalysis showed no signs for infection.  Patient had been given tramadol 50 mg p.o., meclizine 12.5 mg IV Lasix 40 mg IV x 1 dose.  Review of Systems: As mentioned in the history of present illness. All other systems reviewed and are negative. Past Medical History:  Diagnosis Date   Chronic kidney disease, stage 3 (HCC)    Coronary artery disease    Hx of CABG 2006   LIMA-LAD, free RIMA-PDA, Radial-OM1-OM2   Hyperlipidemia    Hypertension    Hypothyroid    MI, old        PAF (paroxysmal atrial fibrillation) (HCC)    was on amio, d/c'd due to prolonged PR interval, rate control   Presence of permanent cardiac pacemaker 07/16/2017   Presence of stent in right coronary artery 07/12   RIMA-PDA occluded, DES stent placed   Syncope 07/12   bradycardic, beta blocker decreased, also felt to be dehydrated   Past Surgical History:  Procedure Laterality Date   CARDIAC CATHETERIZATION  101512   dr. Onalee Hua harding, revealing a atretic bypass to her right with patent distal RCA stent   CAROTID STENT  2012   CORONARY ARTERY BYPASS GRAFT  2006   LIMA-LAD, free RIMA-PDA, Radial-OM1-OM2   DOPPLER ECHOCARDIOGRAPHY  528413   mild asymmetric left ventricular hypertrophy, left ventricular systolic function is low normal, ejection fraction = 50-55%, the LA is moderate dilated, the RA is mildly dilated, no significant valvular disease   INSERT / REPLACE / REMOVE PACEMAKER  07/16/2017  JOINT REPLACEMENT     hip replacement   LEFT HEART CATHETERIZATION WITH CORONARY/GRAFT ANGIOGRAM N/A 10/12/2012   Procedure: LEFT HEART CATHETERIZATION WITH Isabel Caprice;  Surgeon: Marykay Lex, MD;  Location: [facility] CATH LAB;  Service: Cardiovascular;  Laterality: N/A;   LOOP RECORDER IMPLANT N/A 10/15/2012   Procedure: LOOP RECORDER IMPLANT;  Surgeon: Thurmon Fair, MD;  Location: [facility] CATH LAB;  Service: Cardiovascular;  Laterality: N/A;   LOOP RECORDER REMOVAL   07/16/2017   LOOP RECORDER REMOVAL N/A 07/16/2017   Procedure: LOOP RECORDER REMOVAL;  Surgeon: Thurmon Fair, MD;  Location: [facility] INVASIVE CV LAB;  Service: Cardiovascular;  Laterality: N/A;   NM MYOVIEW LTD  100510   post stress left ventricle is normal in size, post stress ejection fraction is 67% global left ventricular systolic function is normal, normal myocardial perfusion study, abnormal myocardial perfusion study, low risk scan   PACEMAKER IMPLANT N/A 07/16/2017   Procedure: PACEMAKER IMPLANT;  Surgeon: Thurmon Fair, MD;  Location: [facility] INVASIVE CV LAB;  Service: Cardiovascular;  Laterality: N/A;   THYROIDECTOMY     Social History:  reports that she has never smoked. Her smokeless tobacco use includes snuff. She reports that she does not drink alcohol and does not use drugs.  Allergies  Allergen Reactions   Penicillins Hives    Did it involve swelling of the face/tongue/throat, SOB, or low BP? No Did it involve sudden or severe rash/hives, skin peeling, or any reaction on the inside of your mouth or nose? No Did you need to seek medical attention at a hospital or doctor's office? No When did it last happen? Unk    If all above answers are "NO", may proceed with cephalosporin use.    Sulfa Antibiotics Hives and Swelling   Gabapentin     Confusion   Lyrica [Pregabalin]     Confusion    Family History  Problem Relation Age of Onset   CAD Mother    CAD Maternal Grandmother     Prior to Admission medications   Medication Sig Start Date End Date Taking? Authorizing Provider  meclizine (ANTIVERT) 12.5 MG tablet Take 1 tablet (12.5 mg total) by mouth 2 (two) times daily as needed for up to 3 days for dizziness. 01/08/23 01/11/23 Yes Gloris Manchester, MD  acetaminophen (TYLENOL) 500 MG tablet Take 1 tablet (500 mg total) by mouth every 6 (six) hours as needed for moderate pain. Patient taking differently: Take 1,000 mg by mouth every 6 (six) hours as needed for moderate pain (pain  score 4-6). 06/27/19   Briant Cedar, MD  atorvastatin (LIPITOR) 80 MG tablet Take 80 mg by mouth at bedtime.     [provider]  azithromycin (ZITHROMAX) 250 MG tablet Take 1 tablet (250 mg total) by mouth daily. Take first 2 tablets together, then 1 every day until finished. Patient not taking: Reported on 01/06/2023 02/26/22   Pricilla Loveless, MD  carvedilol (COREG) 3.125 MG tablet Take 1 tablet (3.125 mg total) by mouth 2 (two) times daily. 04/10/22   Runell Gess, MD  cyclobenzaprine (FLEXERIL) 10 MG tablet Take 5-10 mg by mouth daily as needed for muscle spasms. Patient not taking: Reported on 01/06/2023 05/30/16   [provider]  diclofenac Sodium (VOLTAREN) 1 % GEL Apply 4 g topically 4 (four) times daily. Patient not taking: Reported on 01/06/2023 03/12/22   Palumbo, April, MD  diphenhydrAMINE (BENADRYL) 25 MG tablet Take 25 mg by mouth every 6 (six) hours as needed for  allergies. Patient not taking: Reported on 01/06/2023    [provider]  ELIQUIS 2.5 MG TABS tablet TAKE 1 TABLET BY MOUTH TWICE A DAY 09/26/22   Runell Gess, MD  empagliflozin (JARDIANCE) 10 MG TABS tablet Take 1 tablet (10 mg total) by mouth daily before breakfast. 05/27/22   Croitoru, Mihai, MD  furosemide (LASIX) 40 MG tablet Take 1 tablet (40 mg total) by mouth daily. 01/06/23 04/06/23  Joylene Grapes, NP  gabapentin (NEURONTIN) 300 MG capsule Take 300 mg by mouth daily. Patient not taking: Reported on 01/06/2023 11/04/22   [provider]  HYDROcodone-acetaminophen (NORCO) 5-325 MG tablet Take 1 tablet by mouth every 6 (six) hours as needed. Patient not taking: Reported on 01/06/2023 10/16/22   Tarry Kos, MD  levothyroxine (SYNTHROID, LEVOTHROID) 50 MCG tablet Take 50 mcg by mouth daily before breakfast.     [provider]  lidocaine (LIDODERM) 5 % Place 1 patch onto the skin daily. Remove & Discard patch within 12 hours or as directed by MD Patient not taking:  Reported on 01/06/2023 03/12/22   Palumbo, April, MD  nitroGLYCERIN (NITROSTAT) 0.4 MG SL tablet Place 1 tablet (0.4 mg total) under the tongue every 5 (five) minutes as needed for chest pain. Patient not taking: Reported on 01/06/2023 05/07/21   Runell Gess, MD  Polyethyl Glycol-Propyl Glycol (SYSTANE OP) Place 1 drop into both eyes daily as needed (dry eye).    [provider]  predniSONE (STERAPRED UNI-PAK 21 TAB) 10 MG (21) TBPK tablet Per package instructions Patient not taking: Reported on 01/06/2023 10/07/22   Cristie Hem, PA-C  sacubitril-valsartan (ENTRESTO) 49-51 MG Take 1 tablet by mouth 2 (two) times daily. 02/27/22   Croitoru, Mihai, MD  traMADol (ULTRAM) 50 MG tablet Take 1 tablet (50 mg total) by mouth 2 (two) times daily. Patient taking differently: Take 50 mg by mouth 3 (three) times daily. 10/15/22   Tarry Kos, MD  VITAMIN D3 50 MCG (2000 UT) capsule Take 1 capsule by mouth daily. 02/03/22   [provider]    Physical Exam: Vitals:   01/08/23 0900 01/08/23 1102 01/08/23 1200 01/08/23 1215  BP: (!) 150/82  (!) 183/66   Pulse: (!) 59  63 62  Resp: (!) 21  17 18   Temp:  97.7 F (36.5 C)    TempSrc:      SpO2: 99%  99% 97%   Constitutional: Elderly female currently in no acute distress Eyes: PERRL, lids and conjunctivae normal ENMT: Mucous membranes are moist.  Neck: normal, supple. Respiratory: clear to auscultation bilaterally, no wheezing, no crackles. Normal respiratory effort. No accessory muscle use.  Cardiovascular: Regular rate.  No significant lower extremity edema.  Abdomen: no tenderness, no masses palpated.  Bowel sounds present in all 4 quadrants.   Musculoskeletal: no clubbing / cyanosis. No joint deformity upper and lower extremities. Good ROM, no contractures. Normal muscle tone.  Skin: no rashes, lesions, ulcers. No induration Neurologic: CN 2-12 grossly intact. Strength 5/5 in all 4.  Psychiatric: Normal judgment and  insight. Alert and oriented x 3. Normal mood.   Data Reviewed:  EKG revealed a ventricularly paced rhythm reviewed labs, imaging, and pertinent records as documented. Assessment and Plan: Near syncope Patient presents after having episode of near syncope where she felt like she could pass out after getting up from the bed.  CT scan of head did not note any acute abnormality.  Patient's dose of Lasix had been  increased from 20 mg to 40 mg 2 days ago.  Suspect  orthostatic hypotension as the cause for her symptoms. -Admit to a cardiac telemetry bed -Check orthostatic vital signs -PT to evaluate and treat  Systolic congestive heart failure Suspect acute on chronic.  Patient reports having increased sputum production when attempting to lay down over the last 1 to 2 weeks.  Currently patient appears to be fairly euvolemic.  BNP elevated at 273.7 which is higher than priors.  Chest x-ray showing chronic cardiomegaly with improved aeration.  Last echocardiogram noted EF to be 45 to 50% with indeterminate diastolic function.  Patient had been given Lasix 40 mg IV x 1 dose. -Strict I&Os and daily weights -Check echocardiogram -Reassess, and adjust diuresis as needed -Appreciate cardiology consultative services we will follow-up for any further recommendations  Elevated troponin CAD Patient denies any complaints of chest pain.  High-sensitivity sensitivity troponin 20-23.  History includes prior CABG x4 in 2006 with subsequent DES-PDA in July 2012. Repeat cardiac catheterization in 2014 showed stable coronary anatomy  -Follow-up echocardiogram  Hypercalcemia Acute.  Initial calcium 10.6.  Called patient's symptoms at this time -Holding any calcium supplements -Check calcium levels in a.m.  Paroxysmal atrial fibrillation on chronic anticoagulation S/p pacemaker Patient appears to be ventricularly paced.  Patient reports compliance with Eliquis.  Low suspicion for pulmonary embolism. -Interrogate  pacemaker -Continue Eliquis  Hypothyroidism -Check TSH -Continue levothyroxine  Dyslipidemia -Continue atorvastatin  DVT prophylaxis: Eliquis  Advance Care Planning:   Code Status: Full Code   Consults: Cardiology  Family Communication: Daughter updated over the phone  Severity of Illness: The appropriate patient status for this patient is OBSERVATION. Observation status is judged to be reasonable and necessary in order to provide the required intensity of service to ensure the patient's safety. The patient's presenting symptoms, physical exam findings, and initial radiographic and laboratory data in the context of their medical condition is felt to place them at decreased risk for further clinical deterioration. Furthermore, it is anticipated that the patient will be medically stable for discharge from the hospital within 2 midnights of admission.   Author: Clydie Braun, MD 01/08/2023 12:54 PM  For on call review [email or web address].

## 2023-01-08 NOTE — ED Provider Notes (Addendum)
EMERGENCY DEPARTMENT AT [facility] Provider Note   CSN: [id number] Arrival date & time: 01/08/23  0445     History  Chief Complaint  Patient presents with   Near Syncope    Stephanie Atkinson is a 89 y.o. female.   Near Syncope  Patient presents for near syncope.  Medical history includes HTN, HLD, CAD, CKD, atrial fibrillation, pacemaker placement, CHF.  Yesterday morning, she had an episode of dizziness with standing.  She also felt fatigued throughout the day.  Dizziness improved during the day but became worse this morning.  When she got up to use the bathroom, she had an episode of dizziness.  She has chronic arthritis pain in her legs.  She denies any other areas of discomfort.  She typically has a small appetite.  She has been able to eat and drink during the day.  She denies any recent fluid losses.     Home Medications Prior to Admission medications   Medication Sig Start Date End Date Taking? Authorizing Provider  acetaminophen (TYLENOL) 500 MG tablet Take 1 tablet (500 mg total) by mouth every 6 (six) hours as needed for moderate pain. Patient taking differently: Take 1,000 mg by mouth every 6 (six) hours as needed for moderate pain (pain score 4-6). 06/27/19   Briant Cedar, MD  atorvastatin (LIPITOR) 80 MG tablet Take 80 mg by mouth at bedtime.     [provider]  azithromycin (ZITHROMAX) 250 MG tablet Take 1 tablet (250 mg total) by mouth daily. Take first 2 tablets together, then 1 every day until finished. Patient not taking: Reported on 01/06/2023 02/26/22   Pricilla Loveless, MD  carvedilol (COREG) 3.125 MG tablet Take 1 tablet (3.125 mg total) by mouth 2 (two) times daily. 04/10/22   Runell Gess, MD  cyclobenzaprine (FLEXERIL) 10 MG tablet Take 5-10 mg by mouth daily as needed for muscle spasms. Patient not taking: Reported on 01/06/2023 05/30/16   [provider]  diclofenac Sodium (VOLTAREN) 1 % GEL Apply 4 g topically 4  (four) times daily. Patient not taking: Reported on 01/06/2023 03/12/22   Palumbo, April, MD  diphenhydrAMINE (BENADRYL) 25 MG tablet Take 25 mg by mouth every 6 (six) hours as needed for allergies. Patient not taking: Reported on 01/06/2023    [provider]  ELIQUIS 2.5 MG TABS tablet TAKE 1 TABLET BY MOUTH TWICE A DAY 09/26/22   Runell Gess, MD  empagliflozin (JARDIANCE) 10 MG TABS tablet Take 1 tablet (10 mg total) by mouth daily before breakfast. 05/27/22   Croitoru, Mihai, MD  furosemide (LASIX) 40 MG tablet Take 1 tablet (40 mg total) by mouth daily. 01/06/23 04/06/23  Joylene Grapes, NP  gabapentin (NEURONTIN) 300 MG capsule Take 300 mg by mouth daily. Patient not taking: Reported on 01/06/2023 11/04/22   [provider]  HYDROcodone-acetaminophen (NORCO) 5-325 MG tablet Take 1 tablet by mouth every 6 (six) hours as needed. Patient not taking: Reported on 01/06/2023 10/16/22   Tarry Kos, MD  levothyroxine (SYNTHROID, LEVOTHROID) 50 MCG tablet Take 50 mcg by mouth daily before breakfast.     [provider]  lidocaine (LIDODERM) 5 % Place 1 patch onto the skin daily. Remove & Discard patch within 12 hours or as directed by MD Patient not taking: Reported on 01/06/2023 03/12/22   Palumbo, April, MD  nitroGLYCERIN (NITROSTAT) 0.4 MG SL tablet Place 1 tablet (0.4 mg total) under the tongue every 5 (five) minutes  as needed for chest pain. Patient not taking: Reported on 01/06/2023 05/07/21   Runell Gess, MD  Polyethyl Glycol-Propyl Glycol (SYSTANE OP) Place 1 drop into both eyes daily as needed (dry eye).    [provider]  predniSONE (STERAPRED UNI-PAK 21 TAB) 10 MG (21) TBPK tablet Per package instructions Patient not taking: Reported on 01/06/2023 10/07/22   Cristie Hem, PA-C  sacubitril-valsartan (ENTRESTO) 49-51 MG Take 1 tablet by mouth 2 (two) times daily. 02/27/22   Croitoru, Mihai, MD  traMADol (ULTRAM) 50 MG tablet Take 1 tablet (50 mg  total) by mouth 2 (two) times daily. Patient taking differently: Take 50 mg by mouth 3 (three) times daily. 10/15/22   Tarry Kos, MD  VITAMIN D3 50 MCG (2000 UT) capsule Take 1 capsule by mouth daily. 02/03/22   [provider]      Allergies    Penicillins, Sulfa antibiotics, Gabapentin, and Lyrica [pregabalin]    Review of Systems   Review of Systems  Cardiovascular:  Positive for near-syncope.  Musculoskeletal:  Positive for arthralgias (Chronic).  Neurological:  Positive for dizziness and light-headedness.  All other systems reviewed and are negative.   Physical Exam Updated Vital Signs BP (!) 175/72 (BP Location: Left Arm)   Pulse 73   Temp 97.7 F (36.5 C) (Oral)   Resp (!) 24   SpO2 99%  Physical Exam Vitals and nursing note reviewed.  Constitutional:      General: She is not in acute distress.    Appearance: Normal appearance. She is well-developed. She is not ill-appearing, toxic-appearing or diaphoretic.  HENT:     Head: Normocephalic and atraumatic.     Right Ear: External ear normal.     Left Ear: External ear normal.     Nose: Nose normal.     Mouth/Throat:     Mouth: Mucous membranes are moist.  Eyes:     Extraocular Movements: Extraocular movements intact.     Conjunctiva/sclera: Conjunctivae normal.  Cardiovascular:     Rate and Rhythm: Normal rate and regular rhythm.     Heart sounds: No murmur heard. Pulmonary:     Effort: Pulmonary effort is normal. No respiratory distress.     Breath sounds: Normal breath sounds. No wheezing or rales.  Chest:     Chest wall: No tenderness.  Abdominal:     General: There is no distension.     Palpations: Abdomen is soft.  Musculoskeletal:        General: No swelling or deformity.     Cervical back: Normal range of motion and neck supple.  Skin:    General: Skin is warm and dry.     Coloration: Skin is not jaundiced or pale.  Neurological:     General: No focal deficit present.     Mental Status:  She is alert and oriented to person, place, and time.     Cranial Nerves: No cranial nerve deficit.     Sensory: No sensory deficit.     Motor: No weakness.     Coordination: Coordination normal.  Psychiatric:        Mood and Affect: Mood normal.        Behavior: Behavior normal.     ED Results / Procedures / Treatments   Labs (all labs ordered are listed, but only abnormal results are displayed) Labs Reviewed  BASIC METABOLIC PANEL - Abnormal; Notable for the following components:      Result Value   Creatinine,  Ser 1.39 (*)    Calcium 10.6 (*)    GFR, Estimated 35 (*)    All other components within normal limits  CBC WITH DIFFERENTIAL/PLATELET - Abnormal; Notable for the following components:   WBC 3.9 (*)    All other components within normal limits  BRAIN NATRIURETIC PEPTIDE - Abnormal; Notable for the following components:   B Natriuretic Peptide 273.7 (*)    All other components within normal limits  TROPONIN I (HIGH SENSITIVITY) - Abnormal; Notable for the following components:   Troponin I (High Sensitivity) 22 (*)    All other components within normal limits  MAGNESIUM  URINALYSIS, ROUTINE W REFLEX MICROSCOPIC  CBG MONITORING, ED  TROPONIN I (HIGH SENSITIVITY)    EKG None  Radiology DG Chest Port 1 View  Result Date: 01/08/2023 CLINICAL DATA:  89 year old female with syncope, dizziness. EXAM: PORTABLE CHEST 1 VIEW COMPARISON:  Portable chest 08/18/2022 and earlier. FINDINGS: Portable AP semi upright view at 0516 hours. Improved lung volumes. Stable cardiomegaly and mediastinal contours. Prior CABG. Stable left chest single lead cardiac pacemaker. Chronic lung base hypoinflation. Otherwise Allowing for portable technique the lungs are clear. No pulmonary edema. Stable visualized osseous structures. Negative visible bowel gas. IMPRESSION: Chronic cardiomegaly, CABG, single lead cardiac pacemaker. Chronic but improved lung base hypo ventilation since June. No acute  cardiopulmonary abnormality. Electronically Signed   By: Odessa Fleming M.D.   On: 01/08/2023 06:14   CT Head Wo Contrast  Result Date: 01/08/2023 CLINICAL DATA:  Syncope/presyncope with cerebrovascular cause suspected. Dizziness with near syncope episode EXAM: CT HEAD WITHOUT CONTRAST TECHNIQUE: Contiguous axial images were obtained from the base of the skull through the vertex without intravenous contrast. RADIATION DOSE REDUCTION: This exam was performed according to the departmental dose-optimization program which includes automated exposure control, adjustment of the mA and/or kV according to patient size and/or use of iterative reconstruction technique. COMPARISON:  11/14/2022 FINDINGS: Brain: No evidence of acute infarction, hemorrhage, hydrocephalus, extra-axial collection or mass lesion/mass effect. Small chronic infarcts in the right cerebellum and left thalamus. Ischemic gliosis in the cerebral white matter with mild volume loss. Vascular: No hyperdense vessel or unexpected calcification. Skull: Normal. Negative for fracture or focal lesion. Sinuses/Orbits: No acute finding. IMPRESSION: No acute finding. Chronic small vessel disease. Electronically Signed   By: Tiburcio Pea M.D.   On: 01/08/2023 05:23    Procedures Procedures    Medications Ordered in ED Medications  meclizine (ANTIVERT) tablet 12.5 mg (12.5 mg Oral Given 01/08/23 0525)  traMADol (ULTRAM) tablet 50 mg (50 mg Oral Given 01/08/23 0539)    ED Course/ Medical Decision Making/ A&P                                 Medical Decision Making Amount and/or Complexity of Data Reviewed Labs: ordered. Radiology: ordered. ECG/medicine tests: ordered.  Risk Prescription drug management.   This patient presents to the ED for concern of dizziness, this involves an extensive number of treatment options, and is a complaint that carries with it a high risk of complications and morbidity.  The differential diagnosis includes  dehydration, anemia, metabolic derangements, CVA, BPPV, vestibular neuritis   Co morbidities that complicate the patient evaluation  HTN, HLD, CAD, CKD, atrial fibrillation, pacemaker placement, CHF   Additional history obtained:  Additional history obtained from N/A External records from outside source obtained and reviewed including EMR   Lab Tests:  I Ordered,  and personally interpreted labs.  The pertinent results include: Normal hemoglobin, no leukocytosis, slight elevations in troponin and BNP, baseline creatinine, normal electrolytes   Imaging Studies ordered:  I ordered imaging studies including chest x-ray, CT head I independently visualized and interpreted imaging which showed no acute findings I agree with the radiologist interpretation   Cardiac Monitoring: / EKG:  The patient was maintained on a cardiac monitor.  I personally viewed and interpreted the cardiac monitored which showed an underlying rhythm of: Sinus rhythm  Problem List / ED Course / Critical interventions / Medication management  Patient presents for dizziness and lightheadedness with standing.  Vital signs on arrival are normal.  Patient is well-appearing on exam.  She has ongoing dizziness that is worsened with positional changes.  She has no focal neurologic deficits.   Meclizine was ordered for symptomatic relief.  Workup was initiated.  Lab work and imaging studies are reassuring.  Patient did have improved symptoms following meclizine when she got up to use the bedside commode.  Pacemaker interrogation and urinalysis pending at time of signout.  Care of patient was signed out to oncoming ED provider. I ordered medication including tramadol for chronic pain; meclizine for positional dizziness Reevaluation of the patient after these medicines showed that the patient improved I have reviewed the patients home medicines and have made adjustments as needed   Social Determinants of Health:  Lives  independently        Final Clinical Impression(s) / ED Diagnoses Final diagnoses:  Dizziness    Rx / DC Orders ED Discharge Orders     None         Gloris Manchester, MD 01/08/23 0741    Gloris Manchester, MD 01/08/23 [phone]

## 2023-01-08 NOTE — ED Notes (Signed)
ED TO INPATIENT HANDOFF REPORT  ED Nurse Name and Phone #: Gustavo Lah, rn [phone]  S Name/Age/Gender Stephanie Atkinson 89 y.o. female Room/Bed: 045C/045C  Code Status   Code Status: Full Code  Home/SNF/Other Home Patient oriented to: self, place, time, and situation Is this baseline? Yes   Triage Complete: Triage complete  Chief Complaint Near syncope [R55]  Triage Note Pt.  Presents to the ED BIBA from home for c/o dizziness and near syncopal episode at 0330 getting up to use the restroom. Endorses pacemaker.    Allergies Allergies  Allergen Reactions   Penicillins Hives   Sulfa Antibiotics Hives and Swelling   Gabapentin Other (See Comments)    Confusion   Lyrica [Pregabalin] Other (See Comments)    Confusion    Level of Care/Admitting Diagnosis ED Disposition     ED Disposition  Admit   Condition  --   Comment  Hospital Area: [facility] [[id number]]  Level of Care: Telemetry Cardiac [103]  May place patient in observation at [facility] or [facility] if equivalent level of care is available:: No  Covid Evaluation: Asymptomatic - no recent exposure (last 10 days) testing not required  Diagnosis: Near syncope [692301]  Admitting Physician: Clydie Braun [[id number]]  Attending Physician: Clydie Braun [[id number]]          B Medical/Surgery History Past Medical History:  Diagnosis Date   Chronic kidney disease, stage 3 (HCC)    Coronary artery disease    Hx of CABG 2006   LIMA-LAD, free RIMA-PDA, Radial-OM1-OM2   Hyperlipidemia    Hypertension    Hypothyroid    MI, old        PAF (paroxysmal atrial fibrillation) (HCC)    was on amio, d/c'd due to prolonged PR interval, rate control   Presence of permanent cardiac pacemaker 07/16/2017   Presence of stent in right coronary artery 07/12   RIMA-PDA occluded, DES stent placed   Syncope 07/12   bradycardic, beta blocker decreased, also felt to be dehydrated   Past Surgical History:   Procedure Laterality Date   CARDIAC CATHETERIZATION  101512   dr. Onalee Hua harding, revealing a atretic bypass to her right with patent distal RCA stent   CAROTID STENT  2012   CORONARY ARTERY BYPASS GRAFT  2006   LIMA-LAD, free RIMA-PDA, Radial-OM1-OM2   DOPPLER ECHOCARDIOGRAPHY  [id number]   mild asymmetric left ventricular hypertrophy, left ventricular systolic function is low normal, ejection fraction = 50-55%, the LA is moderate dilated, the RA is mildly dilated, no significant valvular disease   INSERT / REPLACE / REMOVE PACEMAKER  07/16/2017   JOINT REPLACEMENT     hip replacement   LEFT HEART CATHETERIZATION WITH CORONARY/GRAFT ANGIOGRAM N/A 10/12/2012   Procedure: LEFT HEART CATHETERIZATION WITH Isabel Caprice;  Surgeon: Marykay Lex, MD;  Location: [facility] CATH LAB;  Service: Cardiovascular;  Laterality: N/A;   LOOP RECORDER IMPLANT N/A 10/15/2012   Procedure: LOOP RECORDER IMPLANT;  Surgeon: Thurmon Fair, MD;  Location: [facility] CATH LAB;  Service: Cardiovascular;  Laterality: N/A;   LOOP RECORDER REMOVAL  07/16/2017   LOOP RECORDER REMOVAL N/A 07/16/2017   Procedure: LOOP RECORDER REMOVAL;  Surgeon: Thurmon Fair, MD;  Location: [facility] INVASIVE CV LAB;  Service: Cardiovascular;  Laterality: N/A;   NM MYOVIEW LTD  100510   post stress left ventricle is normal in size, post stress ejection fraction is 67% global left ventricular systolic function is normal, normal myocardial perfusion study, abnormal  myocardial perfusion study, low risk scan   PACEMAKER IMPLANT N/A 07/16/2017   Procedure: PACEMAKER IMPLANT;  Surgeon: Thurmon Fair, MD;  Location: [facility] INVASIVE CV LAB;  Service: Cardiovascular;  Laterality: N/A;   THYROIDECTOMY       A IV Location/Drains/Wounds Patient Lines/Drains/Airways Status     Active Line/Drains/Airways     Name Placement date Placement time Site Days   Peripheral IV 01/08/23 22 G Right Antecubital 01/08/23  0533  Antecubital  less than 1             Intake/Output Last 24 hours No intake or output data in the 24 hours ending 01/08/23 1848  Labs/Imaging Results for orders placed or performed during the hospital encounter of 01/08/23 (from the past 48 hour(s))  Basic metabolic panel     Status: Abnormal   Collection Time: 01/08/23  5:27 AM  Result Value Ref Range   Sodium 139 135 - 145 mmol/L   Potassium 4.0 3.5 - 5.1 mmol/L   Chloride 110 98 - 111 mmol/L   CO2 22 22 - 32 mmol/L   Glucose, Bld 89 70 - 99 mg/dL    Comment: Glucose reference range applies only to samples taken after fasting for at least 8 hours.   BUN 15 8 - 23 mg/dL   Creatinine, Ser 0.86 (H) 0.44 - 1.00 mg/dL   Calcium 57.8 (H) 8.9 - 10.3 mg/dL   GFR, Estimated 35 (L) >60 mL/min    Comment: (NOTE) Calculated using the CKD-EPI Creatinine Equation (2021)    Anion gap 7 5 - 15    Comment: Performed at [facility], [address] [place], [place] [address]  CBC WITH DIFFERENTIAL     Status: Abnormal   Collection Time: 01/08/23  5:27 AM  Result Value Ref Range   WBC 3.9 (L) 4.0 - 10.5 K/uL   RBC 4.65 3.87 - 5.11 MIL/uL   Hemoglobin 13.6 12.0 - 15.0 g/dL   HCT 95.2 84.1 - 32.4 %   MCV 92.0 80.0 - 100.0 fL   MCH 29.2 26.0 - 34.0 pg   MCHC 31.8 30.0 - 36.0 g/dL   RDW 40.1 02.7 - 25.3 %   Platelets 173 150 - 400 K/uL   nRBC 0.0 0.0 - 0.2 %   Neutrophils Relative % 66 %   Neutro Abs 2.6 1.7 - 7.7 K/uL   Lymphocytes Relative 25 %   Lymphs Abs 1.0 0.7 - 4.0 K/uL   Monocytes Relative 8 %   Monocytes Absolute 0.3 0.1 - 1.0 K/uL   Eosinophils Relative 0 %   Eosinophils Absolute 0.0 0.0 - 0.5 K/uL   Basophils Relative 0 %   Basophils Absolute 0.0 0.0 - 0.1 K/uL   Immature Granulocytes 1 %   Abs Immature Granulocytes 0.02 0.00 - 0.07 K/uL    Comment: Performed at [facility], [address] [place], [place] [address]  Troponin I (High Sensitivity)     Status: Abnormal   Collection Time: 01/08/23  5:27 AM  Result Value Ref Range   Troponin I  (High Sensitivity) 22 (H) <18 ng/L    Comment: (NOTE) Elevated high sensitivity troponin I (hsTnI) values and significant  changes across serial measurements may suggest ACS but many other  chronic and acute conditions are known to elevate hsTnI results.  Refer to the "Links" section for chest pain algorithms and additional  guidance. Performed at [facility], [address], [place], [place] [address]  Brain natriuretic peptide     Status: Abnormal   Collection Time: 01/08/23  5:27 AM  Result Value Ref Range   B Natriuretic Peptide 273.7 (H) 0.0 - 100.0 pg/mL    Comment: Performed at [facility], [address], [place], [place] [address]  Magnesium     Status: None   Collection Time: 01/08/23  5:27 AM  Result Value Ref Range   Magnesium 2.1 1.7 - 2.4 mg/dL    Comment: Performed at [facility], [address], [place], [place] [address]  Troponin I (High Sensitivity)     Status: Abnormal   Collection Time: 01/08/23  7:32 AM  Result Value Ref Range   Troponin I (High Sensitivity) 23 (H) <18 ng/L    Comment: (NOTE) Elevated high sensitivity troponin I (hsTnI) values and significant  changes across serial measurements may suggest ACS but many other  chronic and acute conditions are known to elevate hsTnI results.  Refer to the "Links" section for chest pain algorithms and additional  guidance. Performed at [facility], [address] [place], [place] [address]   TSH     Status: None   Collection Time: 01/08/23  7:32 AM  Result Value Ref Range   TSH 0.943 0.350 - 4.500 uIU/mL    Comment: Performed by a 3rd Generation assay with a functional sensitivity of <=0.01 uIU/mL. Performed at [facility], [address] [place], [place] [address]   Urinalysis, Routine w reflex microscopic -Urine, Clean Catch     Status: Abnormal   Collection Time: 01/08/23 10:35 AM  Result Value Ref Range   Color, Urine STRAW (A) YELLOW   APPearance CLEAR CLEAR    Specific Gravity, Urine 1.006 1.005 - 1.030   pH 6.0 5.0 - 8.0   Glucose, UA >=500 (A) NEGATIVE mg/dL   Hgb urine dipstick NEGATIVE NEGATIVE   Bilirubin Urine NEGATIVE NEGATIVE   Ketones, ur NEGATIVE NEGATIVE mg/dL   Protein, ur NEGATIVE NEGATIVE mg/dL   Nitrite NEGATIVE NEGATIVE   Leukocytes,Ua NEGATIVE NEGATIVE   RBC / HPF 0-5 0 - 5 RBC/hpf   WBC, UA 0-5 0 - 5 WBC/hpf   Bacteria, UA NONE SEEN NONE SEEN   Squamous Epithelial / HPF 0-5 0 - 5 /HPF    Comment: Performed at [facility], [address], [place], [place] [address]   DG Chest Port 1 View  Result Date: 01/08/2023 CLINICAL DATA:  89 year old female with syncope, dizziness. EXAM: PORTABLE CHEST 1 VIEW COMPARISON:  Portable chest 08/18/2022 and earlier. FINDINGS: Portable AP semi upright view at 0516 hours. Improved lung volumes. Stable cardiomegaly and mediastinal contours. Prior CABG. Stable left chest single lead cardiac pacemaker. Chronic lung base hypoinflation. Otherwise Allowing for portable technique the lungs are clear. No pulmonary edema. Stable visualized osseous structures. Negative visible bowel gas. IMPRESSION: Chronic cardiomegaly, CABG, single lead cardiac pacemaker. Chronic but improved lung base hypo ventilation since June. No acute cardiopulmonary abnormality. Electronically Signed   By: Odessa Fleming M.D.   On: 01/08/2023 06:14   CT Head Wo Contrast  Result Date: 01/08/2023 CLINICAL DATA:  Syncope/presyncope with cerebrovascular cause suspected. Dizziness with near syncope episode EXAM: CT HEAD WITHOUT CONTRAST TECHNIQUE: Contiguous axial images were obtained from the base of the skull through the vertex without intravenous contrast. RADIATION DOSE REDUCTION: This exam was performed according to the departmental dose-optimization program which includes automated exposure control, adjustment of the mA and/or kV according to patient size  and/or use of iterative reconstruction technique. COMPARISON:  11/14/2022  FINDINGS: Brain: No evidence of acute infarction, hemorrhage, hydrocephalus, extra-axial collection or mass lesion/mass effect. Small chronic infarcts in the right cerebellum and left thalamus. Ischemic gliosis in the cerebral white matter with mild volume loss. Vascular: No hyperdense vessel or unexpected calcification. Skull: Normal. Negative for fracture or focal lesion. Sinuses/Orbits: No acute finding. IMPRESSION: No acute finding. Chronic small vessel disease. Electronically Signed   By: Tiburcio Pea M.D.   On: 01/08/2023 05:23    Pending Labs Unresulted Labs (From admission, onward)     Start     Ordered   01/09/23 0500  CBC  Tomorrow morning,   R        01/08/23 1321   01/09/23 0500  Magnesium  Tomorrow morning,   R        01/08/23 1321   01/09/23 0500  Basic metabolic panel  Tomorrow morning,   R        01/08/23 1321            Vitals/Pain Today's Vitals   01/08/23 1452 01/08/23 1600 01/08/23 1808 01/08/23 1842  BP:  (!) 172/70 (!) 192/81   Pulse:  (!) 58 74   Resp:  17 20   Temp: 97.8 F (36.6 C)   98.2 F (36.8 C)  TempSrc: Oral   Oral  SpO2:  98% 97%   PainSc:        Isolation Precautions No active isolations  Medications Medications  apixaban (ELIQUIS) tablet 2.5 mg (2.5 mg Oral Given 01/08/23 1545)  sodium chloride flush (NS) 0.9 % injection 3 mL (3 mLs Intravenous Not Given 01/08/23 1401)  acetaminophen (TYLENOL) tablet 650 mg (has no administration in time range)    Or  acetaminophen (TYLENOL) suppository 650 mg (has no administration in time range)  albuterol (PROVENTIL) (2.5 MG/3ML) 0.083% nebulizer solution 2.5 mg (has no administration in time range)  guaiFENesin (MUCINEX) 12 hr tablet 600 mg (600 mg Oral Given 01/08/23 1451)  meclizine (ANTIVERT) tablet 12.5 mg (12.5 mg Oral Given 01/08/23 0525)  traMADol (ULTRAM) tablet 50 mg (50 mg Oral Given 01/08/23 0539)  furosemide (LASIX) injection 40 mg (40 mg Intravenous Given 01/08/23 1402)     Mobility walks with device     Focused Assessments     R Recommendations: See Admitting Provider Note  Report given to:   Additional Notes:

## 2023-01-08 NOTE — ED Triage Notes (Signed)
Pt.  Presents to the ED BIBA from home for c/o dizziness and near syncopal episode at 0330 getting up to use the restroom. Endorses pacemaker.

## 2023-01-08 NOTE — ED Notes (Signed)
Pacemaker interrogated in earlier shift. Awaiting IT to transfer results of interrogation to MD Jeraldine Loots.  MD Jeraldine Loots notified and aware.

## 2023-01-09 ENCOUNTER — Observation Stay (HOSPITAL_COMMUNITY): Payer: Medicare PPO

## 2023-01-09 DIAGNOSIS — I25810 Atherosclerosis of coronary artery bypass graft(s) without angina pectoris: Secondary | ICD-10-CM | POA: Diagnosis not present

## 2023-01-09 DIAGNOSIS — Z950 Presence of cardiac pacemaker: Secondary | ICD-10-CM | POA: Diagnosis not present

## 2023-01-09 DIAGNOSIS — I5023 Acute on chronic systolic (congestive) heart failure: Secondary | ICD-10-CM | POA: Diagnosis not present

## 2023-01-09 DIAGNOSIS — N1832 Chronic kidney disease, stage 3b: Secondary | ICD-10-CM | POA: Diagnosis not present

## 2023-01-09 DIAGNOSIS — R55 Syncope and collapse: Secondary | ICD-10-CM

## 2023-01-09 DIAGNOSIS — I4821 Permanent atrial fibrillation: Secondary | ICD-10-CM | POA: Diagnosis not present

## 2023-01-09 LAB — CBC
HCT: 43.8 % (ref 36.0–46.0)
Hemoglobin: 14.4 g/dL (ref 12.0–15.0)
MCH: 29.1 pg (ref 26.0–34.0)
MCHC: 32.9 g/dL (ref 30.0–36.0)
MCV: 88.7 fL (ref 80.0–100.0)
Platelets: 171 10*3/uL (ref 150–400)
RBC: 4.94 MIL/uL (ref 3.87–5.11)
RDW: 14.2 % (ref 11.5–15.5)
WBC: 5.5 10*3/uL (ref 4.0–10.5)
nRBC: 0 % (ref 0.0–0.2)

## 2023-01-09 LAB — ECHOCARDIOGRAM COMPLETE
Area-P 1/2: 4.49 cm2
Calc EF: 38.3 %
S' Lateral: 3.4 cm
Single Plane A2C EF: 34 %
Single Plane A4C EF: 40.6 %

## 2023-01-09 LAB — BASIC METABOLIC PANEL
Anion gap: 7 (ref 5–15)
BUN: 14 mg/dL (ref 8–23)
CO2: 23 mmol/L (ref 22–32)
Calcium: 10.5 mg/dL — ABNORMAL HIGH (ref 8.9–10.3)
Chloride: 106 mmol/L (ref 98–111)
Creatinine, Ser: 1.39 mg/dL — ABNORMAL HIGH (ref 0.44–1.00)
GFR, Estimated: 35 mL/min — ABNORMAL LOW (ref >60)
Glucose, Bld: 86 mg/dL (ref 70–99)
Potassium: 2.9 mmol/L — ABNORMAL LOW (ref 3.5–5.1)
Sodium: 136 mmol/L (ref 135–145)

## 2023-01-09 LAB — MAGNESIUM: Magnesium: 1.9 mg/dL (ref 1.7–2.4)

## 2023-01-09 MED ORDER — POTASSIUM CHLORIDE CRYS ER 20 MEQ PO TBCR
20.0000 meq | EXTENDED_RELEASE_TABLET | Freq: Three times a day (TID) | ORAL | Status: DC
  Administered 2023-01-09 (×2): 20 meq via ORAL
  Filled 2023-01-09 (×2): qty 1

## 2023-01-09 MED ORDER — EMPAGLIFLOZIN 10 MG PO TABS
10.0000 mg | ORAL_TABLET | Freq: Every day | ORAL | Status: DC
  Administered 2023-01-10: 10 mg via ORAL
  Filled 2023-01-09 (×2): qty 1

## 2023-01-09 MED ORDER — MAGNESIUM SULFATE 2 GM/50ML IV SOLN
2.0000 g | Freq: Once | INTRAVENOUS | Status: DC
  Administered 2023-01-09: 2 g via INTRAVENOUS
  Filled 2023-01-09: qty 50

## 2023-01-09 MED ORDER — ATORVASTATIN CALCIUM 80 MG PO TABS
80.0000 mg | ORAL_TABLET | Freq: Every day | ORAL | Status: DC
Start: 2023-01-09 — End: 2023-01-10
  Administered 2023-01-09: 80 mg via ORAL
  Filled 2023-01-09: qty 1

## 2023-01-09 MED ORDER — TRAMADOL HCL 50 MG PO TABS
50.0000 mg | ORAL_TABLET | Freq: Three times a day (TID) | ORAL | Status: DC
  Administered 2023-01-09 – 2023-01-10 (×4): 50 mg via ORAL
  Filled 2023-01-09 (×4): qty 1

## 2023-01-09 MED ORDER — POTASSIUM CHLORIDE CRYS ER 20 MEQ PO TBCR
40.0000 meq | EXTENDED_RELEASE_TABLET | Freq: Once | ORAL | Status: AC
  Administered 2023-01-09: 40 meq via ORAL
  Filled 2023-01-09: qty 2

## 2023-01-09 MED ORDER — ACETAMINOPHEN 500 MG PO TABS
1000.0000 mg | ORAL_TABLET | Freq: Four times a day (QID) | ORAL | Status: DC | PRN
  Administered 2023-01-09 – 2023-01-10 (×3): 1000 mg via ORAL
  Filled 2023-01-09 (×3): qty 2

## 2023-01-09 MED ORDER — SACUBITRIL-VALSARTAN 49-51 MG PO TABS
1.0000 | ORAL_TABLET | Freq: Two times a day (BID) | ORAL | Status: DC
  Administered 2023-01-09 – 2023-01-10 (×3): 1 via ORAL
  Filled 2023-01-09 (×3): qty 1

## 2023-01-09 MED ORDER — DICLOFENAC SODIUM 1 % EX GEL
4.0000 g | Freq: Four times a day (QID) | CUTANEOUS | Status: DC
  Administered 2023-01-09 – 2023-01-10 (×4): 4 g via TOPICAL
  Filled 2023-01-09: qty 100

## 2023-01-09 MED ORDER — LEVOTHYROXINE SODIUM 50 MCG PO TABS
50.0000 ug | ORAL_TABLET | Freq: Every day | ORAL | Status: DC
  Administered 2023-01-10: 50 ug via ORAL
  Filled 2023-01-09: qty 1

## 2023-01-09 NOTE — Assessment & Plan Note (Addendum)
Hypokalemia,   Patient with improvement in volume status, renal function today with serum cr at 1.0 with K at 4,6 and serum bicarbonate at 25. Na 141 Mg. 2.7   Plan to continue SGLT 2 inh and furosemide.  Follow up renal function as outpatient.

## 2023-01-09 NOTE — Progress Notes (Signed)
  Echocardiogram 2D Echocardiogram has been performed.  Janalyn Harder 01/09/2023, 3:20 PM

## 2023-01-09 NOTE — Progress Notes (Addendum)
Progress Note   Patient: Stephanie Atkinson KKX:[id number] DOB: 1924/08/28 DOA: 01/08/2023     0 DOS: the patient was seen and examined on 01/09/2023   Brief hospital course: Stephanie Atkinson was admitted to the hospital with the working diagnosis of near syncope episode.   89 yo female with the past medical history of hypertension, dyslipidemia, coronary artery disease, paroxysmal atrial fibrillation, and hypothyroidism who presented with near syncope episode. Around 3:30 am on the day of admission she was trying to get out of bed, when she felt orthostatic symptoms and fell back in bed. Apparently her furosemide dose was increased from 20 to 40 mg daily about 2 days ago. In the ED her blood pressure was 150/82, HR 59, RF 21 and 02 saturation 97%, lungs with no wheezing or rales, heart with S1 and S2 present and regular, abdomen with no distention and no lower extremity edema.   Na 139, K 4,0 Cl 110, bicarbonate 22, glucose 89, bun 15 cr 1,39  Mag 2,1  High sensitive troponin 22 and 23  Wbc 3,9 hgb 13,6 plt 173   CT Head with no acute findings. Chronic small vessel disease.   Chest radiograph with cardiomegaly, with no effusions or infiltrates. Pacemaker in place with one right ventricular lead. Sternotomy wires in place.   EKG 62 bpm, left axis deviation, prolonged qrs with a left bundle branch morphology, ventricular paced rhythm with underlying atrial flutter rhythm with no significant ST segment or T wave changes.      Assessment and Plan: * Near syncope Patient with orthostatic symptoms at home.  Today orthostatic vital sings are negative and she is not having further symptoms.   Plan to continue holding on loop diuretic for now.    Acute on chronic systolic CHF (congestive heart failure) (HCC) Echocardiogram with mild reduction in LV systolic function with EF 45 to 50%, global hypokinesis, RVSP 21.5 mmHg, LA and RA with severe dilatation, mild to moderate mitral valve regurgitation,  tricuspid valve regurgitation mild to moderate, border line LVH.   Improved volume status.  Systolic blood pressure 150 to 145 mmHg.  Plan to hold on loop diuretic therapy. Continue with entresto, and SGLT 2 inh.  Holding carvedilol due to orthostatic symptoms.   Chronic kidney disease, stage 3b (HCC) Hypokalemia,   Renal function with serum cr at 1,39 with K at 2,9 and serum bicarbonate at 23.  Na 136 and Mg 1.9  Plan to continue K correction with KCl will give a total of 80 meq today. Add 2 g Mag sulfate to avoid hypomagnesemia.  Permanent atrial fibrillation (HCC) Continue telemetry monitoring, she has been ventricular paced rhythm.  Holding carvedilol. Continue with apixaban.   Hypothyroidism Resume levothyroxine   Dyslipidemia Continue with atorvastatin.,   CAD (coronary artery disease) of artery bypass graft No chest pain.  Troponin elevation due to heart failure, no acute coronary syndrome.         Subjective: Patient is feeling better, she has no further dizziness or lightheadedness, no dyspnea or lower extremity edema.   Physical Exam: Vitals:   01/09/23 0356 01/09/23 0712 01/09/23 0715 01/09/23 0947  BP: (!) 156/65 (!) 150/58  (!) 145/61  Pulse: (!) 54     Resp: 12 18  17   Temp: 97.6 F (36.4 C) 97.9 F (36.6 C)    TempSrc: Oral Oral Oral   SpO2: 100%      Neurology awake and alert ENT with mild pallor Cardiovascular with S1 and S2 present  and regular, positive systolic murmur at the apex. No JVD No lower extremity edema Respiratory with no rales or wheezing, no rhonchi Abdomen with no distention  Data Reviewed:    Family Communication: no family at the bedside   Disposition: Status is: Observation The patient remains OBS appropriate and will d/c before 2 midnights.  Planned Discharge Destination: Home      Author: Coralie Keens, MD 01/09/2023 2:16 PM  For on call review [email or web address].

## 2023-01-09 NOTE — Assessment & Plan Note (Signed)
No chest pain.  Troponin elevation due to heart failure, no acute coronary syndrome.

## 2023-01-09 NOTE — TOC Initial Note (Addendum)
Transition of Care [facility]) - Initial/Assessment Note    Patient Details  Name: Stephanie Atkinson MRN: [id number] Date of Birth: Oct 17, 1924  [facility]) CM/SW Contact:    Leone Haven, RN Phone Number: 01/09/2023, 10:49 AM  Clinical Narrative:                 From home alone, has PCP and insurance on file, states has an aide that comes once a week to do cleaning and wash clothes from 10 am to 1 pm, also states she just finished up with some physical therapy about 3 weeks ago.  She has a rollator at home. States her daughter [name] who lives in [place],  will transport her home at [employer] and she is support system, states [facility] [address]  Pta self ambulatory with rollator.  She has a scale at home does not weigh daily but plans to do better, checks bp three times a week.  Consumes a low sodium diet.  Await pt eval.  Expected Discharge Plan: Home w Home Health Services Barriers to Discharge: Continued Medical Work up   Patient Goals and CMS Choice Patient states their goals for this hospitalization and ongoing recovery are:: return home   Choice offered to / list presented to : NA      Expected Discharge Plan and Services In-house Referral: NA Discharge Planning Services: CM Consult Post Acute Care Choice: NA Living arrangements for the past 2 months: Single Family Home                 DME Arranged: N/A DME Agency: NA       HH Arranged: NA          Prior Living Arrangements/Services Living arrangements for the past 2 months: Single Family Home Lives with:: Self Patient language and need for interpreter reviewed:: Yes Do you feel safe going back to the place where you live?: Yes      Need for Family Participation in Patient Care: Yes (Comment) Care giver support system in place?: Yes (comment) Current home services: DME (rollator) Criminal Activity/Legal Involvement Pertinent to Current Situation/Hospitalization: No - Comment  as needed  Activities of Daily Living   ADL Screening (condition at time of admission) Independently performs ADLs?: Yes (appropriate for developmental age) Is the patient deaf or have difficulty hearing?: No Does the patient have difficulty seeing, even when wearing glasses/contacts?: No Does the patient have difficulty concentrating, remembering, or making decisions?: No  Permission Sought/Granted Permission sought to share information with : Case Manager Permission granted to share information with : Yes, Verbal Permission Granted  Share Information with NAME: [name]     Permission granted to share info w Relationship: daughter     Emotional Assessment Appearance:: Appears stated age Attitude/Demeanor/Rapport: Engaged Affect (typically observed): Appropriate Orientation: : Oriented to Self, Oriented to Place, Oriented to  Time, Oriented to Situation Alcohol / Substance Use: Not Applicable Psych Involvement: No (comment)  Admission diagnosis:  Dizziness [R42] Near syncope [R55] Patient Active Problem List   Diagnosis Date Noted   Near syncope 01/08/2023   Elevated troponin 01/08/2023   Hypercalcemia 01/08/2023   Hypothyroidism 01/08/2023   Low back pain 06/14/2022   CHF (congestive heart failure) (HCC) 12/11/2021   Acute on chronic systolic CHF (congestive heart failure) (HCC) 12/09/2021   Venous stasis dermatitis of both lower extremities 01/06/2020   Unilateral primary osteoarthritis, right knee 01/06/2020   Community acquired bacterial pneumonia 06/23/2019  AKI (acute kidney injury) (HCC) 06/23/2019   Synovitis of right knee 03/02/2019   Chronic diastolic heart failure (HCC) 09/14/2018   Pacemaker 07/16/2017   Atrial fibrillation with slow ventricular response (HCC) 06/27/2017   Hypercholesterolemia 06/27/2017   Symptomatic bradycardia s/p PPM  06/27/2017   Chronic pain of right knee 05/17/2016   De Quervain's tenosynovitis 05/17/2016   CAD (coronary artery  disease) of artery bypass graft 02/13/2015   Encounter for loop recorder check 02/01/2014   Accelerated junctional rhythm 05/04/2013   Dehydration 10/14/2012   Syncope and collapse 10/13/2012   History of first degree atrioventricular block 10/11/2012   Long term (current) use of anticoagulants 10/06/2012   AVB - beta blocker and Amiodarone stopped 07/10/2012   Hypertension    Hx of CABG X 4 3/06-     Dyslipidemia    RCA DES placed 7/12- patent 8/14    Permanent atrial fibrillation (HCC)    Acute kidney injury superimposed on chronic kidney disease (HCC)    PCP:  Linus Galas, NP Pharmacy:   [id number] [place], [place] - [address] [place] [address] Phone: [phone] Fax: [phone]  [id number] [place], [place] - [address] AT [facility] OF [address] [place] [address] Phone: [phone] Fax: [phone]     Social Determinants of Health (SDOH) Social History: SDOH Screenings   Food Insecurity: No Food Insecurity (01/08/2023)  Housing: Low Risk  (01/08/2023)  Transportation Needs: No Transportation Needs (01/08/2023)  Utilities: Not At Risk (01/08/2023)  Social Connections: Unknown (12/18/2021)   Received from [facility]  Tobacco Use: High Risk (01/08/2023)   SDOH Interventions:     Readmission Risk Interventions     No data to display

## 2023-01-09 NOTE — Progress Notes (Signed)
DAILY PROGRESS NOTE   Patient Name: Stephanie Atkinson Date of Encounter: 01/09/2023 Cardiologist: Nanetta Batty, MD  Chief Complaint   Feels better today  Patient Profile   89 yo female with history of CAD s/p CABG, PAF, CKD3, permanent afib s/p PPM who presents with near syncope   Subjective   Net negative about 1L overnight. BP remains elevated today. Creatinine stable at 1.39. Potassium low at 2.9 today. Orthostatic vitals were negative. Echo pending today. Overall she feels less "Swimmy headed"  Objective   Vitals:   01/09/23 0151 01/09/23 0356 01/09/23 0712 01/09/23 0715  BP:  (!) 156/65 (!) 150/58   Pulse:  (!) 54    Resp: 19 12 18    Temp: (!) 97.2 F (36.2 C) 97.6 F (36.4 C) 97.9 F (36.6 C)   TempSrc:  Oral Oral Oral  SpO2:  100%      Intake/Output Summary (Last 24 hours) at 01/09/2023 1055 Last data filed at 01/09/2023 0815 Gross per 24 hour  Intake 258 ml  Output 1000 ml  Net -742 ml   There were no vitals filed for this visit.  Physical Exam   General appearance: alert and no distress Neck: no carotid bruit, no JVD, and thyroid not enlarged, symmetric, no tenderness/mass/nodules Lungs: clear to auscultation bilaterally Heart: regular rate and rhythm, S1, S2 normal, no murmur, click, rub or gallop Abdomen: soft, non-tender; bowel sounds normal; no masses,  no organomegaly Extremities: extremities normal, atraumatic, no cyanosis or edema Pulses: 2+ and symmetric Skin: Skin color, texture, turgor normal. No rashes or lesions Neurologic: Grossly normal Psych: Pleasant  Inpatient Medications    Scheduled Meds:  apixaban  2.5 mg Oral BID   diclofenac Sodium  4 g Topical QID   guaiFENesin  600 mg Oral BID   potassium chloride  20 mEq Oral TID   sodium chloride flush  3 mL Intravenous Q12H   traMADol  50 mg Oral TID    Continuous Infusions:   PRN Meds: acetaminophen, albuterol, melatonin   Labs   Results for orders placed or performed  during the hospital encounter of 01/08/23 (from the past 48 hour(s))  Basic metabolic panel     Status: Abnormal   Collection Time: 01/08/23  5:27 AM  Result Value Ref Range   Sodium 139 135 - 145 mmol/L   Potassium 4.0 3.5 - 5.1 mmol/L   Chloride 110 98 - 111 mmol/L   CO2 22 22 - 32 mmol/L   Glucose, Bld 89 70 - 99 mg/dL    Comment: Glucose reference range applies only to samples taken after fasting for at least 8 hours.   BUN 15 8 - 23 mg/dL   Creatinine, Ser 1.61 (H) 0.44 - 1.00 mg/dL   Calcium 09.6 (H) 8.9 - 10.3 mg/dL   GFR, Estimated 35 (L) >60 mL/min    Comment: (NOTE) Calculated using the CKD-EPI Creatinine Equation (2021)    Anion gap 7 5 - 15    Comment: Performed at [facility], [address], [place], [place] [address]  CBC WITH DIFFERENTIAL     Status: Abnormal   Collection Time: 01/08/23  5:27 AM  Result Value Ref Range   WBC 3.9 (L) 4.0 - 10.5 K/uL   RBC 4.65 3.87 - 5.11 MIL/uL   Hemoglobin 13.6 12.0 - 15.0 g/dL   HCT 98.1 19.1 - 47.8 %   MCV 92.0 80.0 - 100.0 fL   MCH 29.2 26.0 - 34.0 pg  MCHC 31.8 30.0 - 36.0 g/dL   RDW 09.8 11.9 - 14.7 %   Platelets 173 150 - 400 K/uL   nRBC 0.0 0.0 - 0.2 %   Neutrophils Relative % 66 %   Neutro Abs 2.6 1.7 - 7.7 K/uL   Lymphocytes Relative 25 %   Lymphs Abs 1.0 0.7 - 4.0 K/uL   Monocytes Relative 8 %   Monocytes Absolute 0.3 0.1 - 1.0 K/uL   Eosinophils Relative 0 %   Eosinophils Absolute 0.0 0.0 - 0.5 K/uL   Basophils Relative 0 %   Basophils Absolute 0.0 0.0 - 0.1 K/uL   Immature Granulocytes 1 %   Abs Immature Granulocytes 0.02 0.00 - 0.07 K/uL    Comment: Performed at [facility], [address], [place], [place] [address]  Troponin I (High Sensitivity)     Status: Abnormal   Collection Time: 01/08/23  5:27 AM  Result Value Ref Range   Troponin I (High Sensitivity) 22 (H) <18 ng/L    Comment: (NOTE) Elevated high sensitivity troponin I (hsTnI) values and significant  changes across serial  measurements may suggest ACS but many other  chronic and acute conditions are known to elevate hsTnI results.  Refer to the "Links" section for chest pain algorithms and additional  guidance. Performed at [facility], [address] [place], [place] [address]   Brain natriuretic peptide     Status: Abnormal   Collection Time: 01/08/23  5:27 AM  Result Value Ref Range   B Natriuretic Peptide 273.7 (H) 0.0 - 100.0 pg/mL    Comment: Performed at [facility], [address], [place], [place] [address]  Magnesium     Status: None   Collection Time: 01/08/23  5:27 AM  Result Value Ref Range   Magnesium 2.1 1.7 - 2.4 mg/dL    Comment: Performed at [facility], [address] [place], [place] [address]  Troponin I (High Sensitivity)     Status: Abnormal   Collection Time: 01/08/23  7:32 AM  Result Value Ref Range   Troponin I (High Sensitivity) 23 (H) <18 ng/L    Comment: (NOTE) Elevated high sensitivity troponin I (hsTnI) values and significant  changes across serial measurements may suggest ACS but many other  chronic and acute conditions are known to elevate hsTnI results.  Refer to the "Links" section for chest pain algorithms and additional  guidance. Performed at [facility], [address], [place], [place] [address]   TSH     Status: None   Collection Time: 01/08/23  7:32 AM  Result Value Ref Range   TSH 0.943 0.350 - 4.500 uIU/mL    Comment: Performed by a 3rd Generation assay with a functional sensitivity of <=0.01 uIU/mL. Performed at [facility], [address], [place], [place] [address]   Urinalysis, Routine w reflex microscopic -Urine, Clean Catch     Status: Abnormal   Collection Time: 01/08/23 10:35 AM  Result Value Ref Range   Color, Urine STRAW (A) YELLOW   APPearance CLEAR CLEAR   Specific Gravity, Urine 1.006 1.005 - 1.030   pH 6.0 5.0 - 8.0   Glucose, UA >=500 (A) NEGATIVE mg/dL   Hgb urine dipstick NEGATIVE NEGATIVE    Bilirubin Urine NEGATIVE NEGATIVE   Ketones, ur NEGATIVE NEGATIVE mg/dL   Protein, ur NEGATIVE NEGATIVE mg/dL   Nitrite NEGATIVE NEGATIVE   Leukocytes,Ua NEGATIVE NEGATIVE   RBC / HPF 0-5  0 - 5 RBC/hpf   WBC, UA 0-5 0 - 5 WBC/hpf   Bacteria, UA NONE SEEN NONE SEEN   Squamous Epithelial / HPF 0-5 0 - 5 /HPF    Comment: Performed at [facility], [address], [place], [place] [address]  CBC     Status: None   Collection Time: 01/09/23  3:03 AM  Result Value Ref Range   WBC 5.5 4.0 - 10.5 K/uL   RBC 4.94 3.87 - 5.11 MIL/uL   Hemoglobin 14.4 12.0 - 15.0 g/dL   HCT 38.7 56.4 - 33.2 %   MCV 88.7 80.0 - 100.0 fL   MCH 29.1 26.0 - 34.0 pg   MCHC 32.9 30.0 - 36.0 g/dL   RDW 95.1 88.4 - 16.6 %   Platelets 171 150 - 400 K/uL   nRBC 0.0 0.0 - 0.2 %    Comment: Performed at [facility], [address] [place], [place] [address]  Magnesium     Status: None   Collection Time: 01/09/23  3:03 AM  Result Value Ref Range   Magnesium 1.9 1.7 - 2.4 mg/dL    Comment: Performed at [facility], [address] [place], [place] [address]  Basic metabolic panel     Status: Abnormal   Collection Time: 01/09/23  3:03 AM  Result Value Ref Range   Sodium 136 135 - 145 mmol/L   Potassium 2.9 (L) 3.5 - 5.1 mmol/L   Chloride 106 98 - 111 mmol/L   CO2 23 22 - 32 mmol/L   Glucose, Bld 86 70 - 99 mg/dL    Comment: Glucose reference range applies only to samples taken after fasting for at least 8 hours.   BUN 14 8 - 23 mg/dL   Creatinine, Ser 3.23 (H) 0.44 - 1.00 mg/dL   Calcium 55.7 (H) 8.9 - 10.3 mg/dL   GFR, Estimated 35 (L) >60 mL/min    Comment: (NOTE) Calculated using the CKD-EPI Creatinine Equation (2021)    Anion gap 7 5 - 15    Comment: Performed at [id number], [address] [place], [place] [address]    ECG   N/A  Telemetry   Sinus rhythm - Personally Reviewed  Radiology    DG Chest Port 1 View  Result Date: 01/08/2023 CLINICAL DATA:  89 year old  female with syncope, dizziness. EXAM: PORTABLE CHEST 1 VIEW COMPARISON:  Portable chest 08/18/2022 and earlier. FINDINGS: Portable AP semi upright view at 0516 hours. Improved lung volumes. Stable cardiomegaly and mediastinal contours. Prior CABG. Stable left chest single lead cardiac pacemaker. Chronic lung base hypoinflation. Otherwise Allowing for portable technique the lungs are clear. No pulmonary edema. Stable visualized osseous structures. Negative visible bowel gas. IMPRESSION: Chronic cardiomegaly, CABG, single lead cardiac pacemaker. Chronic but improved lung base hypo ventilation since June. No acute cardiopulmonary abnormality. Electronically Signed   By: Odessa Fleming M.D.   On: 01/08/2023 06:14   CT Head Wo Contrast  Result Date: 01/08/2023 CLINICAL DATA:  Syncope/presyncope with cerebrovascular cause suspected. Dizziness with near syncope episode EXAM: CT HEAD WITHOUT CONTRAST TECHNIQUE: Contiguous axial images were obtained from the base of the skull through the vertex without intravenous contrast. RADIATION DOSE REDUCTION: This exam was performed according to the departmental dose-optimization program which includes automated exposure control, adjustment of the mA and/or kV according to patient size and/or use of iterative reconstruction technique. COMPARISON:  11/14/2022 FINDINGS: Brain: No evidence of acute infarction, hemorrhage, hydrocephalus, extra-axial collection  or mass lesion/mass effect. Small chronic infarcts in the right cerebellum and left thalamus. Ischemic gliosis in the cerebral white matter with mild volume loss. Vascular: No hyperdense vessel or unexpected calcification. Skull: Normal. Negative for fracture or focal lesion. Sinuses/Orbits: No acute finding. IMPRESSION: No acute finding. Chronic small vessel disease. Electronically Signed   By: Tiburcio Pea M.D.   On: 01/08/2023 05:23    Cardiac Studies   Echo pending  Assessment   Principal Problem:   Near  syncope Active Problems:   Dyslipidemia   Permanent atrial fibrillation (HCC)   Long term (current) use of anticoagulants   CAD (coronary artery disease) of artery bypass graft   Pacemaker   Acute on chronic systolic CHF (congestive heart failure) (HCC)   Elevated troponin   Hypercalcemia   Hypothyroidism   Plan   Hold diuretics today -she appears euvolemic. She does seem better after 1L diuresis yesterday - creatinine stable at 1.39. Although this is worse, ?if this is baseline and her lower creatinine was due to dilution. Will review echo today- if filling pressures appear low, then may need to be on higher dose lasix 40 mg daily at home. BP remains elevated. Entresto, coreg and Jardiance have been held. Will restart Jardiance today. Hold coreg d/t relative bradycardia.  Time Spent Directly with Patient:  I have spent a total of 25 minutes with the patient reviewing hospital notes, telemetry, EKGs, labs and examining the patient as well as establishing an assessment and plan that was discussed personally with the patient.  > 50% of time was spent in direct patient care.  Length of Stay:  LOS: 0 days   Chrystie Nose, MD, [facility], FACP  [place]  [facility]  Medical Director of the Advanced Lipid Disorders &  Cardiovascular Risk Reduction Clinic Diplomate of the American Board of Clinical Lipidology Attending Cardiologist  Direct Dial: [phone]  Fax: [phone]  Website:  [email or web address] Nicely Julie-Ann Vanmaanen 01/09/2023, 10:55 AM

## 2023-01-09 NOTE — Assessment & Plan Note (Addendum)
Patient with orthostatic symptoms at home.  During her hospitalization orthostatic vital sings have been negative and she is not having further symptoms.   Diuretic regimen has been modified.

## 2023-01-09 NOTE — Hospital Course (Addendum)
Mrs. Bian was admitted to the hospital with the working diagnosis of near syncope episode.   89 yo female with the past medical history of hypertension, dyslipidemia, coronary artery disease, paroxysmal atrial fibrillation, and hypothyroidism who presented with near syncope episode. Around 3:30 am on the day of admission she was trying to get out of bed, when she felt orthostatic symptoms and fell back in bed. Apparently her furosemide dose was increased from 20 to 40 mg daily about 2 days ago. In the ED her blood pressure was 150/82, HR 59, RF 21 and 02 saturation 97%, lungs with no wheezing or rales, heart with S1 and S2 present and regular, abdomen with no distention and no lower extremity edema.   Na 139, K 4,0 Cl 110, bicarbonate 22, glucose 89, bun 15 cr 1,39  Mag 2,1  High sensitive troponin 22 and 23  Wbc 3,9 hgb 13,6 plt 173   CT Head with no acute findings. Chronic small vessel disease.   Chest radiograph with cardiomegaly, with no effusions or infiltrates. Pacemaker in place with one right ventricular lead. Sternotomy wires in place.   EKG 62 bpm, left axis deviation, prolonged qrs with a left bundle branch morphology, ventricular paced rhythm with underlying atrial flutter rhythm with no significant ST segment or T wave changes.

## 2023-01-09 NOTE — Plan of Care (Signed)
  Problem: Education: Goal: Knowledge of General Education information will improve Description: Including pain rating scale, medication(s)/side effects and non-pharmacologic comfort measures Outcome: Progressing   Problem: Health Behavior/Discharge Planning: Goal: Ability to manage health-related needs will improve Outcome: Progressing   Problem: Activity: Goal: Risk for activity intolerance will decrease Outcome: Progressing   Problem: Coping: Goal: Level of anxiety will decrease Outcome: Progressing   Problem: Pain Management: Goal: General experience of comfort will improve Outcome: Progressing

## 2023-01-09 NOTE — Assessment & Plan Note (Signed)
Continue with atorvastatin,

## 2023-01-09 NOTE — Assessment & Plan Note (Addendum)
Continue with levothyroxine  

## 2023-01-09 NOTE — Progress Notes (Signed)
Heart Failure Navigator Progress Note  Assessed for Heart & Vascular TOC clinic readiness.  Patient EF 45-50%, No HF TOC per Dr. Ella Jubilee, follow up with [facility] at discharge.   Navigator will sign off at this time.   Rhae Hammock, BSN, Scientist, clinical (histocompatibility and immunogenetics) Only

## 2023-01-09 NOTE — Assessment & Plan Note (Addendum)
Echocardiogram with mild reduction in LV systolic function with EF 35 to 40%, global hypokinesis, mild LVH, RV systolic function is preserved, LA and RA with severe dilatation, RVSP 24. mmHg, trivial pericardial effusion, moderate pulmonic valve regurgitation.   Improved volume status.  Systolic blood pressure 152 to 145 mmHg.   Continue with entresto, and SGLT 2 inh.  Plan to resume carvedilol at the time of discharge. Continue loop diuretics, will continue with 20 mg of furosemide. Per her daughter patient had orthostatic symptoms when dose was increase to 40 mg daily.  In case of volume overload she has been instructed to take 40 mg until volume improves.

## 2023-01-09 NOTE — Assessment & Plan Note (Addendum)
Patient with paced rhythm, plan to continue carvedilol.  Anticoagulation with apixaban.  Follow up with EP as outpatient.

## 2023-01-09 NOTE — Evaluation (Signed)
Physical Therapy Evaluation Patient Details Name: Stephanie Atkinson MRN: [id number] DOB: [birth year] Today's Date: 01/09/2023  History of Present Illness  Pt is a 89 y.o. female who presented 01/08/23 after near syncopal episode. PMH: HTN, HLD, CAD s/p CABG, paroxysmal atrial fibrillation, hypothyroidism, CKD stage 3, MI, syncope   Clinical Impression  Pt presents with condition above and deficits mentioned below, see PT Problem List. PTA, pt was mod I utilizing a rollator for functional mobility, living alone in her 2nd floor apartment at an [facility]. There is elevator access. Currently, pt demonstrates deficits in strength, balance, and activity tolerance. See Vestibular Assessment General Observation and Orthostatics below in regards to pt's subjective dizziness. Currently, pt is able to perform all functional mobility without LOB or physical assistance while utilizing a RW. She could benefit from further acute PT services and follow-up with HHPT upon d/c to address her deficits above.    Vestibular Assessment - 01/09/23 0001       Vestibular Assessment   General Observation Performed a vestibular assessment due to pt reporting her dizziness was sometimes like the room was "spinning". However, unable to reproduce her symptoms and no noted nystagmus with vestibular testing this date. She does have limited neck ROM due to arthritis and difficulty with smooth pursuits, but no dizziness noted. She did endorse some dizziness intermittently when straining, leaning forward to donn socks and with transitioning supine to sit, but her symptoms resolved quickly. Her BP did appear to drop from sitting to standing initially, but improved with prolonged standing.      Symptom Behavior   Subjective history of current problem Pt reports feeling lightheaded like she was about to pass out on 01/06/23 when getting up but then reports feeling like the room was spinning x2 separate occasions since then. Pt did report some  dizziness when leaning forward, straining to donn socks this session and intermittently when changing positions, but denied symptoms with vestibular testing.    Type of Dizziness  Blurred vision;Spinning;Lightheadedness;Comment   about to pass out   Frequency of Dizziness intermittent since 01/06/23    Duration of Dizziness momentarily, but at times up to 15-20 min per pt    Symptom Nature Motion provoked;Positional;Intermittent    Aggravating Factors Supine to sit;Sit to stand;Forward bending    Relieving Factors Rest;Lying supine    Progression of Symptoms Better    History of similar episodes none      Oculomotor Exam   Oculomotor Alignment Normal    Ocular ROM WFL    Spontaneous Absent    Smooth Pursuits Comment   pt had difficulty tracking finger, often being too slow or ahead of therapist, but denied dizziness   Comment Poor smooth pursuits quality, but no symptoms      Oculomotor Exam-Fixation Suppressed    Ocular Alignment Normal    Ocular ROM WFL    Left Head Impulse correction x2 noted but denied symptoms    Right Head Impulse correction x2 noted but denied symptoms      Positional Testing   Dix-Hallpike Dix-Hallpike Right;Dix-Hallpike Left    Horizontal Canal Testing Horizontal Canal Right;Horizontal Canal Left      Dix-Hallpike Right   Dix-Hallpike Right Duration negative    Dix-Hallpike Right Symptoms No nystagmus      Dix-Hallpike Left   Dix-Hallpike Left Duration negative    Dix-Hallpike Left Symptoms No nystagmus      Horizontal Canal Right   Horizontal Canal Right Duration negative    Horizontal Canal  Right Symptoms Normal      Horizontal Canal Left   Horizontal Canal Left Duration negative    Horizontal Canal Left Symptoms Normal      Orthostatics   BP supine (x 5 minutes) 145/61    HR supine (x 5 minutes) 61    BP sitting 158/77    HR sitting 64    BP standing (after 1 minute) 140/86    HR standing (after 1 minute) 66    BP standing (after 3  minutes) 159/80    HR standing (after 3 minutes) 62    Orthostatics Comment Mild dizziness noted with supine to sit per pt                    If plan is discharge home, recommend the following: Assistance with cooking/housework;Assist for transportation   Can travel by private vehicle        Equipment Recommendations None recommended by PT  Recommendations for Other Services       Functional Status Assessment Patient has had a recent decline in their functional status and demonstrates the ability to make significant improvements in function in a reasonable and predictable amount of time.     Precautions / Restrictions Precautions Precautions: Fall Precaution Comments: watch BP Restrictions Weight Bearing Restrictions: No      Mobility  Bed Mobility Overal bed mobility: Needs Assistance Bed Mobility: Rolling, Sidelying to Sit, Sit to Supine Rolling: Supervision, Used rails Sidelying to sit: Supervision, HOB elevated, Used rails   Sit to supine: Supervision, HOB elevated, Used rails   General bed mobility comments: Extra time for all bed mobility, supervision for safety    Transfers Overall transfer level: Needs assistance Equipment used: Rolling walker (2 wheels) Transfers: Sit to/from Stand Sit to Stand: Contact guard assist           General transfer comment: No LOB, CGA for safety    Ambulation/Gait Ambulation/Gait assistance: Contact guard assist Gait Distance (Feet): 180 Feet Assistive device: Rolling walker (2 wheels) Gait Pattern/deviations: Step-through pattern, Decreased stride length, Trunk flexed Gait velocity: reduced Gait velocity interpretation: <1.8 ft/sec, indicate of risk for recurrent falls   General Gait Details: Pt ambulates slowly with a flexed posture, no LOB, CGA for safety  Stairs            Wheelchair Mobility     Tilt Bed    Modified Rankin (Stroke Patients Only)       Balance Overall balance assessment:  Needs assistance Sitting-balance support: No upper extremity supported, Feet supported Sitting balance-Leahy Scale: Good     Standing balance support: Bilateral upper extremity supported, During functional activity, Reliant on assistive device for balance Standing balance-Leahy Scale: Poor Standing balance comment: Reliant on RW                             Pertinent Vitals/Pain Pain Assessment Pain Assessment: Faces Faces Pain Scale: Hurts little more Pain Location: L leg sciatica Pain Descriptors / Indicators: Discomfort, Grimacing, Guarding Pain Intervention(s): Monitored during session, Limited activity within patient's tolerance, Repositioned    Home Living Family/patient expects to be discharged to:: Assisted living (ILF) Living Arrangements: Alone               Home Equipment: Grab bars - tub/shower;Grab bars - toilet;Shower seat;Cane - single point;Rollator (4 wheels);Cane - quad Additional Comments: elevator access to her 2nd floor apartment, walk-in shower, normal height toilet  Prior Function Prior Level of Function : Needs assist             Mobility Comments: Uses rollator; no falls in at least 6 months ADLs Comments: Aide comes 1x/week for household chores; pt cooks and does her own dressing and bathing; daughter provides transportation     Extremity/Trunk Assessment   Upper Extremity Assessment Upper Extremity Assessment: Overall WFL for tasks assessed    Lower Extremity Assessment Lower Extremity Assessment: Generalized weakness (hx of L leg sciatica pain)       Communication   Communication Communication: No apparent difficulties  Cognition Arousal: Alert Behavior During Therapy: WFL for tasks assessed/performed Overall Cognitive Status: Within Functional Limits for tasks assessed                                 General Comments: repetitive at times and has difficulty following cues for  vestibular testing, but  likely baseline        General Comments General comments (skin integrity, edema, etc.): educated pt to change positions slowly, monitor symptoms, lay down as needed, and perform AROM of extremities to try to maintain stable BP as needed - she verbalized understanding    Exercises     Assessment/Plan    PT Assessment Patient needs continued PT services  PT Problem List Decreased strength;Decreased activity tolerance;Decreased balance;Decreased mobility       PT Treatment Interventions DME instruction;Gait training;Functional mobility training;Therapeutic exercise;Therapeutic activities;Balance training;Neuromuscular re-education;Patient/family education    PT Goals (Current goals can be found in the Care Plan section)  Acute Rehab PT Goals Patient Stated Goal: to not be dizzy PT Goal Formulation: With patient/family Time For Goal Achievement: 01/23/23 Potential to Achieve Goals: Good    Frequency Min 1X/week     Co-evaluation               AM-PAC PT "6 Clicks" Mobility  Outcome Measure Help needed turning from your back to your side while in a flat bed without using bedrails?: A Little Help needed moving from lying on your back to sitting on the side of a flat bed without using bedrails?: A Little Help needed moving to and from a bed to a chair (including a wheelchair)?: A Little Help needed standing up from a chair using your arms (e.g., wheelchair or bedside chair)?: A Little Help needed to walk in hospital room?: A Little Help needed climbing 3-5 steps with a railing? : A Little 6 Click Score: 18    End of Session Equipment Utilized During Treatment: Gait belt Activity Tolerance: Patient tolerated treatment well Patient left: in chair;with call bell/phone within reach;with chair alarm set;with family/visitor present Nurse Communication: Mobility status;Other (comment) (BP) PT Visit Diagnosis: Unsteadiness on feet (R26.81);Other abnormalities of gait and mobility  (R26.89);Muscle weakness (generalized) (M62.81);Difficulty in walking, not elsewhere classified (R26.2);Dizziness and giddiness (R42)    Time: 1914-7829 PT Time Calculation (min) (ACUTE ONLY): 43 min   Charges:   PT Evaluation $PT Eval Moderate Complexity: 1 Mod PT Treatments $Therapeutic Activity: 23-37 mins PT General Charges $$ ACUTE PT VISIT: 1 Visit         Virgil Benedict, PT, DPT Acute Rehabilitation Services  Office: [phone]   Bettina Gavia 01/09/2023, 11:20 AM

## 2023-01-10 ENCOUNTER — Telehealth: Payer: Self-pay | Admitting: *Deleted

## 2023-01-10 ENCOUNTER — Other Ambulatory Visit (HOSPITAL_COMMUNITY): Payer: Self-pay

## 2023-01-10 ENCOUNTER — Ambulatory Visit (INDEPENDENT_AMBULATORY_CARE_PROVIDER_SITE_OTHER): Payer: Medicare PPO

## 2023-01-10 DIAGNOSIS — N1832 Chronic kidney disease, stage 3b: Secondary | ICD-10-CM

## 2023-01-10 DIAGNOSIS — Z950 Presence of cardiac pacemaker: Secondary | ICD-10-CM | POA: Diagnosis not present

## 2023-01-10 DIAGNOSIS — I5023 Acute on chronic systolic (congestive) heart failure: Secondary | ICD-10-CM | POA: Diagnosis not present

## 2023-01-10 DIAGNOSIS — E039 Hypothyroidism, unspecified: Secondary | ICD-10-CM | POA: Diagnosis not present

## 2023-01-10 DIAGNOSIS — I4821 Permanent atrial fibrillation: Secondary | ICD-10-CM | POA: Diagnosis not present

## 2023-01-10 DIAGNOSIS — R55 Syncope and collapse: Secondary | ICD-10-CM | POA: Diagnosis not present

## 2023-01-10 DIAGNOSIS — I5022 Chronic systolic (congestive) heart failure: Secondary | ICD-10-CM | POA: Diagnosis not present

## 2023-01-10 LAB — BASIC METABOLIC PANEL
Anion gap: 6 (ref 5–15)
BUN: 14 mg/dL (ref 8–23)
CO2: 25 mmol/L (ref 22–32)
Calcium: 10.9 mg/dL — ABNORMAL HIGH (ref 8.9–10.3)
Chloride: 110 mmol/L (ref 98–111)
Creatinine, Ser: 1.09 mg/dL — ABNORMAL HIGH (ref 0.44–1.00)
GFR, Estimated: 46 mL/min — ABNORMAL LOW (ref >60)
Glucose, Bld: 101 mg/dL — ABNORMAL HIGH (ref 70–99)
Potassium: 4.6 mmol/L (ref 3.5–5.1)
Sodium: 141 mmol/L (ref 135–145)

## 2023-01-10 LAB — MAGNESIUM: Magnesium: 2.7 mg/dL — ABNORMAL HIGH (ref 1.7–2.4)

## 2023-01-10 MED ORDER — SPIRONOLACTONE 12.5 MG HALF TABLET: 12.5000 mg | ORAL_TABLET | Freq: Every day | ORAL | Status: DC

## 2023-01-10 MED ORDER — SPIRONOLACTONE 25 MG PO TABS
12.5000 mg | ORAL_TABLET | Freq: Every day | ORAL | 0 refills | Status: DC
  Filled 2023-01-10: qty 15, 30d supply, fill #0

## 2023-01-10 MED ORDER — CEPHALEXIN 500 MG PO CAPS: 500.0000 mg | ORAL_CAPSULE | Freq: Two times a day (BID) | ORAL | 0 refills | Status: AC

## 2023-01-10 MED ORDER — FUROSEMIDE 20 MG PO TABS
20.0000 mg | ORAL_TABLET | Freq: Every day | ORAL | 3 refills | Status: DC
  Filled 2023-01-10: qty 90, 45d supply, fill #0

## 2023-01-10 MED ORDER — CARVEDILOL 3.125 MG PO TABS: 3.1250 mg | ORAL_TABLET | Freq: Two times a day (BID) | ORAL | Status: DC

## 2023-01-10 MED ORDER — FUROSEMIDE 40 MG PO TABS
40.0000 mg | ORAL_TABLET | Freq: Every day | ORAL | Status: DC
  Administered 2023-01-10: 40 mg via ORAL
  Filled 2023-01-10: qty 1

## 2023-01-10 NOTE — Discharge Summary (Addendum)
Physician Discharge Summary   Patient: Stephanie Atkinson MRN: [id number] DOB: 09-24-1924  Admit date:     01/08/2023  Discharge date: 01/10/23  Discharge Physician: Stephanie Atkinson   PCP: Stephanie Galas, NP   Recommendations at discharge:    Patient will resume furosemide 20 mg po daily, increase to 40 mg daily in case of volume overload, weight increase 2 to 3 lbs in 24 hrs or 5 lbs in 7 days.   Added spironolactone.  Continue heart failure guideline medical therapy with Entresto, and Carvedilol.  Follow up renal function and electrolytes in 7 to 10 days. Follow up with Stephanie Galas NP in 7 to 10 days. Follow up with Cardiology and EP as scheduled.   I spoke with patient's daughter at the bedside, we talked in detail about patient's condition, plan of care and prognosis and all questions were addressed.  Discharge Diagnoses: Principal Problem:   Near syncope Active Problems:   Acute on chronic systolic CHF (congestive heart failure) (HCC)   Chronic kidney disease, stage 3b (HCC)   Permanent atrial fibrillation (HCC)   Hypothyroidism   Dyslipidemia   CAD (coronary artery disease) of artery bypass graft  Resolved Problems:   * No resolved hospital problems. [facility] Course: [name] was admitted to the hospital with the working diagnosis of near syncope episode.   89 yo female with the past medical history of hypertension, dyslipidemia, coronary artery disease, paroxysmal atrial fibrillation, and hypothyroidism who presented with near syncope episode. Around 3:30 am on the day of admission she was trying to get out of bed, when she felt orthostatic symptoms and fell back in bed. Apparently her furosemide dose was increased from 20 to 40 mg daily about 2 days ago. In the ED her blood pressure was 150/82, HR 59, RF 21 and 02 saturation 97%, lungs with no wheezing or rales, heart with S1 and S2 present and regular, abdomen with no distention and no lower extremity edema.   Na  139, K 4,0 Cl 110, bicarbonate 22, glucose 89, bun 15 cr 1,39  Mag 2,1  High sensitive troponin 22 and 23  Wbc 3,9 hgb 13,6 plt 173   CT Head with no acute findings. Chronic small vessel disease.   Chest radiograph with cardiomegaly, with no effusions or infiltrates. Pacemaker in place with one right ventricular lead. Sternotomy wires in place.   EKG 62 bpm, left axis deviation, prolonged qrs with a left bundle branch morphology, ventricular paced rhythm with underlying atrial flutter rhythm with no significant ST segment or T wave changes.     Assessment and Plan: * Near syncope Patient with orthostatic symptoms at home.  During her hospitalization orthostatic vital sings have been negative and she is not having further symptoms.   Plan to use furosemide only as needed.     Acute on chronic systolic CHF (congestive heart failure) (HCC) Echocardiogram with mild reduction in LV systolic function with EF 35 to 40%, global hypokinesis, mild LVH, RV systolic function is preserved, LA and RA with severe dilatation, RVSP 24. mmHg, trivial pericardial effusion, moderate pulmonic valve regurgitation.   Improved volume status.  Systolic blood pressure 152 to 145 mmHg.   Continue with entresto, and SGLT 2 inh.  Plan to resume carvedilol at the time of discharge. Continue loop diuretics, will continue with 20 mg of furosemide. Per her daughter patient had orthostatic symptoms when dose was increase to 40 mg daily.  In case of volume overload she has  been instructed to take 40 mg until volume improves.   Chronic kidney disease, stage 3b (HCC) Hypokalemia,   Patient with improvement in volume status, renal function today with serum cr at 1.0 with K at 4,6 and serum bicarbonate at 25. Na 141 Mg. 2.7   Plan to continue SGLT 2 inh and furosemide.  Follow up renal function as outpatient.   Permanent atrial fibrillation [facility]) Patient with paced rhythm, plan to continue carvedilol.   Anticoagulation with apixaban.  Follow up with EP as outpatient.   Hypothyroidism Continue with levothyroxine   Dyslipidemia Continue with atorvastatin.,   CAD (coronary artery disease) of artery bypass graft No chest pain.  Troponin elevation due to heart failure, no acute coronary syndrome.    Consultants: cardiology  Procedures performed: none   Disposition: Home Diet recommendation:  Cardiac diet DISCHARGE MEDICATION: Allergies as of 01/10/2023       Reactions   Penicillins Hives   Sulfa Antibiotics Hives, Swelling   Gabapentin Other (See Comments)   Confusion   Lyrica [pregabalin] Other (See Comments)   Confusion        Medication List     STOP taking these medications    gabapentin 300 MG capsule Commonly known as: NEURONTIN   nitroGLYCERIN 0.4 MG SL tablet Commonly known as: NITROSTAT       TAKE these medications    acetaminophen 500 MG tablet Commonly known as: TYLENOL Take 1 tablet (500 mg total) by mouth every 6 (six) hours as needed for moderate pain. What changed:  how much to take when to take this   carvedilol 3.125 MG tablet Commonly known as: COREG Take 1 tablet (3.125 mg total) by mouth 2 (two) times daily.   Eliquis 2.5 MG Tabs tablet Generic drug: apixaban TAKE 1 TABLET BY MOUTH TWICE A DAY   empagliflozin 10 MG Tabs tablet Commonly known as: Jardiance Take 1 tablet (10 mg total) by mouth daily before breakfast.   furosemide 20 MG tablet Commonly known as: LASIX Take 1 tablet (20 mg total) by mouth daily. Take two tablets in case of weight gain 2 to 3 lbs in 24 hrs or 5 lbs in 7 days, until weight back to baseline. What changed:  medication strength how much to take additional instructions   levothyroxine 50 MCG tablet Commonly known as: SYNTHROID Take 50 mcg by mouth daily before breakfast.   Lipitor 80 MG tablet Generic drug: atorvastatin Take 80 mg by mouth at bedtime.   sacubitril-valsartan 49-51 MG Commonly  known as: ENTRESTO Take 1 tablet by mouth 2 (two) times daily.   spironolactone 25 MG tablet Commonly known as: ALDACTONE Take 0.5 tablets (12.5 mg total) by mouth daily. Start taking on: January 11, 2023   SYSTANE OP Place 1 drop into both eyes daily as needed (dry eye).   traMADol 50 MG tablet Commonly known as: ULTRAM Take 1 tablet (50 mg total) by mouth 2 (two) times daily. What changed: when to take this   Vitamin D3 50 MCG (2000 UT) capsule Take 1 capsule by mouth once a week.        Discharge Exam: Filed Weights   01/10/23 0415  Weight: 56.4 kg   BP (!) 152/71 (BP Location: Left Arm)   Pulse 60   Temp (!) 97.5 F (36.4 C) (Oral)   Resp 18   Wt 56.4 kg   SpO2 99%   BMI 22.74 kg/m   Patient is feeling better, no further orthostatic symptoms, no chest  pain, no dyspnea., no orthopnea or PND.  Neurology awake and alert ENT with mild pallor Cardiovascular with S1 and S2 present and regular with no gallops, rubs or murmurs No JVD No lower extremity edema Respiratory with no rales or wheezing, no rhonchi Abdomen with no distention   Condition at discharge: stable  The results of significant diagnostics from this hospitalization (including imaging, microbiology, ancillary and laboratory) are listed below for reference.   Imaging Studies: ECHOCARDIOGRAM COMPLETE  Result Date: 01/09/2023    ECHOCARDIOGRAM REPORT   Patient Name:   FALESHA HENRICK Date of Exam: 01/09/2023 Medical Rec #:  [id number]     Height:       62.0 in Accession #:    [id number]    Weight:       135.4 lb Date of Birth:  1925/01/03     BSA:          1.620 m Patient Age:    89 years      BP:           145/61 mmHg Patient Gender: F             HR:           61 bpm. Exam Location:  Inpatient Procedure: 2D Echo, Color Doppler and Cardiac Doppler Indications:    R55 Syncope  History:        Patient has prior history of Echocardiogram examinations, most                 recent 05/21/2022. CHF, CAD and  Previous Myocardial Infarction,                 Abnormal ECG, Prior CABG and Pacemaker; Signs/Symptoms:Syncope.  Sonographer:    Sheralyn Boatman RDCS Referring Phys: [id number] RONDELL A SMITH IMPRESSIONS  1. Abnormal (paradoxical) septal motion, consistent with RV pacemaker.     . Left ventricular ejection fraction, by estimation, is 35 to 40%. The left ventricle has moderately decreased function. The left ventricle demonstrates global hypokinesis. There is mild left ventricular hypertrophy. Left ventricular diastolic parameters are indeterminate.  2. Right ventricular systolic function is normal. The right ventricular size is normal. There is normal pulmonary artery systolic pressure.  3. Left atrial size was severely dilated.  4. Right atrial size was severely dilated.  5. The mitral valve is degenerative. Trivial mitral valve regurgitation. No evidence of mitral stenosis.  6. The aortic valve is tricuspid. Aortic valve regurgitation is trivial. No aortic stenosis is present.  7. Pulmonic valve regurgitation is moderate. FINDINGS  Left Ventricle: Abnormal (paradoxical) septal motion, consistent with RV pacemaker. Left ventricular ejection fraction, by estimation, is 35 to 40%. The left ventricle has moderately decreased function. The left ventricle demonstrates global hypokinesis. The left ventricular internal cavity size was normal in size. There is mild left ventricular hypertrophy. Left ventricular diastolic function could not be evaluated due to atrial fibrillation. Left ventricular diastolic parameters are indeterminate. Indeterminate filling pressures. Right Ventricle: The right ventricular size is normal. No increase in right ventricular wall thickness. Right ventricular systolic function is normal. There is normal pulmonary artery systolic pressure. The tricuspid regurgitant velocity is 2.29 m/s, and  with an assumed right atrial pressure of 3 mmHg, the estimated right ventricular systolic pressure is 24.0 mmHg.  Left Atrium: Left atrial size was severely dilated. Right Atrium: Right atrial size was severely dilated. Pericardium: Trivial pericardial effusion is present. Mitral Valve: The mitral valve is degenerative in appearance. Trivial mitral valve regurgitation.  No evidence of mitral valve stenosis. Tricuspid Valve: The tricuspid valve is normal in structure. Tricuspid valve regurgitation is mild . No evidence of tricuspid stenosis. Aortic Valve: The aortic valve is tricuspid. Aortic valve regurgitation is trivial. No aortic stenosis is present. Pulmonic Valve: The pulmonic valve was normal in structure. Pulmonic valve regurgitation is moderate. No evidence of pulmonic stenosis. Aorta: The aortic root and ascending aorta are structurally normal, with no evidence of dilitation. IAS/Shunts: No atrial level shunt detected by color flow Doppler. Additional Comments: A device lead is visualized.  LEFT VENTRICLE PLAX 2D LVIDd:         3.90 cm     Diastology LVIDs:         3.40 cm     LV e' medial:    4.25 cm/s LV PW:         1.02 cm     LV E/e' medial:  17.9 LV IVS:        1.07 cm     LV e' lateral:   7.06 cm/s LVOT diam:     2.20 cm     LV E/e' lateral: 10.8 LV SV:         43 LV SV Index:   26 LVOT Area:     3.80 cm  LV Volumes (MOD) LV vol d, MOD A2C: 48.1 ml LV vol d, MOD A4C: 42.4 ml LV vol s, MOD A2C: 31.7 ml LV vol s, MOD A4C: 25.2 ml LV SV MOD A2C:     16.4 ml LV SV MOD A4C:     42.4 ml LV SV MOD BP:      17.9 ml RIGHT VENTRICLE            IVC RV S prime:     7.37 cm/s  IVC diam: 1.50 cm TAPSE (M-mode): 0.8 cm LEFT ATRIUM             Index        RIGHT ATRIUM           Index LA diam:        4.40 cm 2.72 cm/m   RA Area:     22.80 cm LA Vol (A2C):   80.8 ml 49.89 ml/m  RA Volume:   69.70 ml  43.03 ml/m LA Vol (A4C):   65.9 ml 40.69 ml/m LA Biplane Vol: 75.5 ml 46.62 ml/m  AORTIC VALVE             PULMONIC VALVE LVOT Vmax:   62.50 cm/s  PR End Diast Vel: 1.35 msec LVOT Vmean:  43.400 cm/s LVOT VTI:    0.112 m  AORTA  Ao Root diam: 2.80 cm Ao Asc diam:  3.30 cm MITRAL VALVE               TRICUSPID VALVE MV Area (PHT): 4.49 cm    TR Peak grad:   21.0 mmHg MV Decel Time: 169 msec    TR Vmax:        229.00 cm/s MV E velocity: 76.00 cm/s                            SHUNTS                            Systemic VTI:  0.11 m  Systemic Diam: 2.20 cm Chilton Si MD Electronically signed by Chilton Si MD Signature Date/Time: 01/09/2023/3:34:03 PM    Final    DG Chest Port 1 View  Result Date: 01/08/2023 CLINICAL DATA:  89 year old female with syncope, dizziness. EXAM: PORTABLE CHEST 1 VIEW COMPARISON:  Portable chest 08/18/2022 and earlier. FINDINGS: Portable AP semi upright view at 0516 hours. Improved lung volumes. Stable cardiomegaly and mediastinal contours. Prior CABG. Stable left chest single lead cardiac pacemaker. Chronic lung base hypoinflation. Otherwise Allowing for portable technique the lungs are clear. No pulmonary edema. Stable visualized osseous structures. Negative visible bowel gas. IMPRESSION: Chronic cardiomegaly, CABG, single lead cardiac pacemaker. Chronic but improved lung base hypo ventilation since June. No acute cardiopulmonary abnormality. Electronically Signed   By: Odessa Fleming M.D.   On: 01/08/2023 06:14   CT Head Wo Contrast  Result Date: 01/08/2023 CLINICAL DATA:  Syncope/presyncope with cerebrovascular cause suspected. Dizziness with near syncope episode EXAM: CT HEAD WITHOUT CONTRAST TECHNIQUE: Contiguous axial images were obtained from the base of the skull through the vertex without intravenous contrast. RADIATION DOSE REDUCTION: This exam was performed according to the departmental dose-optimization program which includes automated exposure control, adjustment of the mA and/or kV according to patient size and/or use of iterative reconstruction technique. COMPARISON:  11/14/2022 FINDINGS: Brain: No evidence of acute infarction, hemorrhage, hydrocephalus,  extra-axial collection or mass lesion/mass effect. Small chronic infarcts in the right cerebellum and left thalamus. Ischemic gliosis in the cerebral white matter with mild volume loss. Vascular: No hyperdense vessel or unexpected calcification. Skull: Normal. Negative for fracture or focal lesion. Sinuses/Orbits: No acute finding. IMPRESSION: No acute finding. Chronic small vessel disease. Electronically Signed   By: Tiburcio Pea M.D.   On: 01/08/2023 05:23    Microbiology: Results for orders placed or performed during the hospital encounter of 03/11/22  Urine Culture     Status: Abnormal   Collection Time: 03/11/22 11:50 PM   Specimen: Urine, Clean Catch  Result Value Ref Range Status   Specimen Description   Final    URINE, CLEAN CATCH Performed at [facility], [address] [place], [place] [address]    Special Requests   Final    Normal Performed at [facility], [address] [place], [place] [address]    Culture 20,000 COLONIES/mL ENTEROCOCCUS FAECALIS (A)  Final   Report Status 03/14/2022 FINAL  Final   Organism ID, Bacteria ENTEROCOCCUS FAECALIS (A)  Final      Susceptibility   Enterococcus faecalis - MIC*    AMPICILLIN <=2 SENSITIVE Sensitive     NITROFURANTOIN <=16 SENSITIVE Sensitive     VANCOMYCIN 1 SENSITIVE Sensitive     * 20,000 COLONIES/mL ENTEROCOCCUS FAECALIS    Labs: CBC: Recent Labs  Lab 01/08/23 0527 01/09/23 0303  WBC 3.9* 5.5  NEUTROABS 2.6  --   HGB 13.6 14.4  HCT 42.8 43.8  MCV 92.0 88.7  PLT 173 171   Basic Metabolic Panel: Recent Labs  Lab 01/08/23 0527 01/09/23 0303 01/10/23 0332  NA 139 136 141  K 4.0 2.9* 4.6  CL 110 106 110  CO2 22 23 25   GLUCOSE 89 86 101*  BUN 15 14 14   CREATININE 1.39* 1.39* 1.09*  CALCIUM 10.6* 10.5* 10.9*  MG 2.1 1.9 2.7*   Liver Function Tests: No results for input(s): "AST", "ALT", "ALKPHOS", "BILITOT", "PROT", "ALBUMIN" in the last 168 hours. CBG: No  results for input(s): "GLUCAP" in the last 168 hours.  Discharge time spent:  greater than 30 minutes.  Signed: Coralie Keens, MD [facility] 01/10/2023

## 2023-01-10 NOTE — TOC Transition Note (Addendum)
Transition of Care [facility]) - CM/SW Discharge Note   Patient Details  Name: Stephanie Atkinson MRN: [id number] Date of Birth: [birth year]  Transition of [facility]) CM/SW Contact:  Leone Haven, RN Phone Number: 01/10/2023, 11:28 AM   Clinical Narrative:    For dc today, her daughter is at bedside to transport her home, NCM offered choice for HHPT,  HHOT, HHRN, she states she had the agency before but could not think of the name,  looks like it was [place], NCM made referral to [name].  Awaiting to hear back. Per [name] she can take referral.  Soc will begin 24 to 48 hrs post dc.    Final next level of care: Home w Home Health Services Barriers to Discharge: No Barriers Identified   Patient Goals and CMS Choice CMS Medicare.gov Compare Post Acute Care list provided to:: Patient Choice offered to / list presented to : Patient  Discharge Placement                         Discharge Plan and Services Additional resources added to the After Visit Summary for   In-house Referral: NA Discharge Planning Services: CM Consult Post Acute Care Choice: NA          DME Arranged: N/A DME Agency: NA       HH Arranged: PT, OT, RN HH Agency: [facility] Date [facility] Agency Contacted: 01/10/23 Time HH Agency Contacted: 1128 Representative spoke with at [facility] Agency: [name]  Social Determinants of Health (SDOH) Interventions SDOH Screenings   Food Insecurity: No Food Insecurity (01/08/2023)  Housing: Low Risk  (01/08/2023)  Transportation Needs: No Transportation Needs (01/08/2023)  Utilities: Not At Risk (01/08/2023)  Social Connections: Unknown (12/18/2021)   Received from [facility]  Tobacco Use: High Risk (01/08/2023)     Readmission Risk Interventions     No data to display

## 2023-01-10 NOTE — Consult Note (Signed)
[facility] Liaison Consult Note     01/10/2023  Stephanie Atkinson 06/14/1924 [id number]  Insurance: [employer] Medicare PPO   Primary Care Provider: Linus Galas, NP with [facility], this provider is listed for the transition of care follow up appointments  and community [facility] calls   [facility] Liaison met patient at bedside at [facility]. Also, daughter Stephanie Atkinson at the bedside to explain potential needs for post hospital telephonic follow up.  They endorse PCP and states this is the first time hearing of this program.  Explained that insurance agreements and support for patients in the provider agreements also for additional support as patients become higher risk for care coordination and disease management. Daughter states she understands as to why she had never heard of [facility] [employer] [facility] [employer] and [employer]. She verbalizes a better understanding of post hospital follow up.  Patient and daughter Stephanie Atkinson request follow up call to Stephanie Atkinson, phone as listed in electronic medical record.   The patient was screened for observation day 2 and assess for readmission prevention needs with noted 2 ED visits and 1 hospital admissions in 6 months.  The patient was assessed for potential Community Care Coordination service needs for post hospital transition for care coordination. Review of patient's electronic medical record reveals patient is for [facility] with [facility]..   Plan:   Referral request for community care coordination: Will request follow up support as with provider office.   VBCI Community Care, Population Health does not replace or interfere with any arrangements made by the Inpatient Transition of Care team.   For questions contact:   Charlesetta Shanks, RN, BSN, CCM [place]  [facility], [facility]  [facility] Liaison Direct Dial: [phone] or secure chat Email: [email or web address]

## 2023-01-10 NOTE — Progress Notes (Signed)
Mobility Specialist Progress Note:    01/10/23 1140  Mobility  Activity Ambulated with assistance in hallway  Level of Assistance Standby assist, set-up cues, supervision of patient - no hands on  Assistive Device Front wheel walker  Distance Ambulated (ft) 250 ft  Activity Response Tolerated well  Mobility Referral Yes  $Mobility charge 1 Mobility  Mobility Specialist Start Time (ACUTE ONLY) 1035  Mobility Specialist Stop Time (ACUTE ONLY) 1050  Mobility Specialist Time Calculation (min) (ACUTE ONLY) 15 min   Received pt seated EOB having no complaints and agreeable to mobility. Pt was asymptomatic throughout ambulation and returned to room w/o fault. Left seated EOB w/ call bell in reach and all needs met. RN in room.   Thompson Grayer Mobility Specialist  Please contact vis Secure Chat or  Rehab Office [phone]

## 2023-01-10 NOTE — Progress Notes (Addendum)
Patient had burning when urinating today. Her urine analysis is negative and she has no abdominal pain.  Urine cultures have been positive since 2024 multiple bacteria, all less than 100,000 cfu. I spoke with her daughter about her symptoms, I doubt this time patient is having a true urine infection. Will monitor her symptoms for 24 hrs if persistent will start cephalexin for 3 days.  I have sent a prescription of cephalexin for 3 days.  Will stop SGLT 2 inh.

## 2023-01-10 NOTE — Progress Notes (Signed)
DAILY PROGRESS NOTE   Patient Name: Stephanie Atkinson Date of Encounter: 01/10/2023 Cardiologist: Nanetta Batty, MD  Chief Complaint   Feels much better today  Patient Profile   89 yo female with history of CAD s/p CABG, PAF, CKD3, permanent afib s/p PPM who presents with near syncope   Subjective   Negative about 1.5L - echo yesterday shows further decline in LVEF to 35-40%, severe biatrial enlargement. Has been restarted on home meds except carvedilol.  HR in the 60's. Creatinine has improved today to 1.09.   Objective   Vitals:   01/09/23 1613 01/09/23 1920 01/09/23 2326 01/10/23 0415  BP: (!) 145/61 (!) 131/58 (!) 152/67 (!) 152/71  Pulse: 61 62 61 60  Resp: 19 19 16 18   Temp: 97.9 F (36.6 C) 97.8 F (36.6 C) 97.6 F (36.4 C) (!) 97.5 F (36.4 C)  TempSrc: Oral Oral Oral Oral  SpO2: 99% 100% 100% 99%  Weight:    56.4 kg    Intake/Output Summary (Last 24 hours) at 01/10/2023 1005 Last data filed at 01/10/2023 0600 Gross per 24 hour  Intake 960 ml  Output 1450 ml  Net -490 ml   Filed Weights   01/10/23 0415  Weight: 56.4 kg    Physical Exam   General appearance: alert and no distress Neck: no carotid bruit, no JVD, and thyroid not enlarged, symmetric, no tenderness/mass/nodules Lungs: clear to auscultation bilaterally Heart: regular rate and rhythm, S1, S2 normal, no murmur, click, rub or gallop Abdomen: soft, non-tender; bowel sounds normal; no masses,  no organomegaly Extremities: extremities normal, atraumatic, no cyanosis or edema Pulses: 2+ and symmetric Skin: Skin color, texture, turgor normal. No rashes or lesions Neurologic: Grossly normal Psych: Pleasant  Inpatient Medications    Scheduled Meds:  apixaban  2.5 mg Oral BID   atorvastatin  80 mg Oral QHS   diclofenac Sodium  4 g Topical QID   empagliflozin  10 mg Oral Daily   guaiFENesin  600 mg Oral BID   levothyroxine  50 mcg Oral QAC breakfast   sacubitril-valsartan  1 tablet Oral  BID   sodium chloride flush  3 mL Intravenous Q12H   traMADol  50 mg Oral TID    Continuous Infusions:   PRN Meds: acetaminophen, albuterol, melatonin   Labs   Results for orders placed or performed during the hospital encounter of 01/08/23 (from the past 48 hour(s))  Urinalysis, Routine w reflex microscopic -Urine, Clean Catch     Status: Abnormal   Collection Time: 01/08/23 10:35 AM  Result Value Ref Range   Color, Urine STRAW (A) YELLOW   APPearance CLEAR CLEAR   Specific Gravity, Urine 1.006 1.005 - 1.030   pH 6.0 5.0 - 8.0   Glucose, UA >=500 (A) NEGATIVE mg/dL   Hgb urine dipstick NEGATIVE NEGATIVE   Bilirubin Urine NEGATIVE NEGATIVE   Ketones, ur NEGATIVE NEGATIVE mg/dL   Protein, ur NEGATIVE NEGATIVE mg/dL   Nitrite NEGATIVE NEGATIVE   Leukocytes,Ua NEGATIVE NEGATIVE   RBC / HPF 0-5 0 - 5 RBC/hpf   WBC, UA 0-5 0 - 5 WBC/hpf   Bacteria, UA NONE SEEN NONE SEEN   Squamous Epithelial / HPF 0-5 0 - 5 /HPF    Comment: Performed at [facility], [address], [place], [place] [address]  CBC     Status: None   Collection Time: 01/09/23  3:03 AM  Result Value Ref Range   WBC 5.5 4.0 - 10.5 K/uL  RBC 4.94 3.87 - 5.11 MIL/uL   Hemoglobin 14.4 12.0 - 15.0 g/dL   HCT 64.3 32.9 - 51.8 %   MCV 88.7 80.0 - 100.0 fL   MCH 29.1 26.0 - 34.0 pg   MCHC 32.9 30.0 - 36.0 g/dL   RDW 84.1 66.0 - 63.0 %   Platelets 171 150 - 400 K/uL   nRBC 0.0 0.0 - 0.2 %    Comment: Performed at [facility], [address], [place], [place] [address]  Magnesium     Status: None   Collection Time: 01/09/23  3:03 AM  Result Value Ref Range   Magnesium 1.9 1.7 - 2.4 mg/dL    Comment: Performed at [facility], [address] [place], [place] [address]  Basic metabolic panel     Status: Abnormal   Collection Time: 01/09/23  3:03 AM  Result Value Ref Range   Sodium 136 135 - 145 mmol/L   Potassium 2.9 (L) 3.5 - 5.1 mmol/L   Chloride 106 98 - 111 mmol/L   CO2 23 22 - 32  mmol/L   Glucose, Bld 86 70 - 99 mg/dL    Comment: Glucose reference range applies only to samples taken after fasting for at least 8 hours.   BUN 14 8 - 23 mg/dL   Creatinine, Ser 5.73 (H) 0.44 - 1.00 mg/dL   Calcium 22.0 (H) 8.9 - 10.3 mg/dL   GFR, Estimated 35 (L) >60 mL/min    Comment: (NOTE) Calculated using the CKD-EPI Creatinine Equation (2021)    Anion gap 7 5 - 15    Comment: Performed at [facility], [address], [place], [place] [address]  Basic metabolic panel     Status: Abnormal   Collection Time: 01/10/23  3:32 AM  Result Value Ref Range   Sodium 141 135 - 145 mmol/L   Potassium 4.6 3.5 - 5.1 mmol/L   Chloride 110 98 - 111 mmol/L   CO2 25 22 - 32 mmol/L   Glucose, Bld 101 (H) 70 - 99 mg/dL    Comment: Glucose reference range applies only to samples taken after fasting for at least 8 hours.   BUN 14 8 - 23 mg/dL   Creatinine, Ser 0.62 (H) 0.44 - 1.00 mg/dL   Calcium 37.6 (H) 8.9 - 10.3 mg/dL   GFR, Estimated 46 (L) >60 mL/min    Comment: (NOTE) Calculated using the CKD-EPI Creatinine Equation (2021)    Anion gap 6 5 - 15    Comment: Performed at [facility], [address], [place], [place] [address]  Magnesium     Status: Abnormal   Collection Time: 01/10/23  3:32 AM  Result Value Ref Range   Magnesium 2.7 (H) 1.7 - 2.4 mg/dL    Comment: Performed at [facility], [address] [place], [place] [address]    ECG   N/A  Telemetry   Sinus rhythm - Personally Reviewed  Radiology    ECHOCARDIOGRAM COMPLETE  Result Date: 01/09/2023    ECHOCARDIOGRAM REPORT   Patient Name:   Stephanie Atkinson Date of Exam: 01/09/2023 Medical Rec #:  [id number]     Height:       62.0 in Accession #:    [id number]    Weight:       135.4 lb Date of Birth:  Sep 13, 1924     BSA:          1.620 m Patient Age:  89 years      BP:           145/61 mmHg Patient Gender: F             HR:           61 bpm. Exam Location:  Inpatient Procedure: 2D Echo, Color Doppler  and Cardiac Doppler Indications:    R55 Syncope  History:        Patient has prior history of Echocardiogram examinations, most                 recent 05/21/2022. CHF, CAD and Previous Myocardial Infarction,                 Abnormal ECG, Prior CABG and Pacemaker; Signs/Symptoms:Syncope.  Sonographer:    Sheralyn Boatman RDCS Referring Phys: [id number] RONDELL A SMITH IMPRESSIONS  1. Abnormal (paradoxical) septal motion, consistent with RV pacemaker.     . Left ventricular ejection fraction, by estimation, is 35 to 40%. The left ventricle has moderately decreased function. The left ventricle demonstrates global hypokinesis. There is mild left ventricular hypertrophy. Left ventricular diastolic parameters are indeterminate.  2. Right ventricular systolic function is normal. The right ventricular size is normal. There is normal pulmonary artery systolic pressure.  3. Left atrial size was severely dilated.  4. Right atrial size was severely dilated.  5. The mitral valve is degenerative. Trivial mitral valve regurgitation. No evidence of mitral stenosis.  6. The aortic valve is tricuspid. Aortic valve regurgitation is trivial. No aortic stenosis is present.  7. Pulmonic valve regurgitation is moderate. FINDINGS  Left Ventricle: Abnormal (paradoxical) septal motion, consistent with RV pacemaker. Left ventricular ejection fraction, by estimation, is 35 to 40%. The left ventricle has moderately decreased function. The left ventricle demonstrates global hypokinesis. The left ventricular internal cavity size was normal in size. There is mild left ventricular hypertrophy. Left ventricular diastolic function could not be evaluated due to atrial fibrillation. Left ventricular diastolic parameters are indeterminate. Indeterminate filling pressures. Right Ventricle: The right ventricular size is normal. No increase in right ventricular wall thickness. Right ventricular systolic function is normal. There is normal pulmonary artery systolic  pressure. The tricuspid regurgitant velocity is 2.29 m/s, and  with an assumed right atrial pressure of 3 mmHg, the estimated right ventricular systolic pressure is 24.0 mmHg. Left Atrium: Left atrial size was severely dilated. Right Atrium: Right atrial size was severely dilated. Pericardium: Trivial pericardial effusion is present. Mitral Valve: The mitral valve is degenerative in appearance. Trivial mitral valve regurgitation. No evidence of mitral valve stenosis. Tricuspid Valve: The tricuspid valve is normal in structure. Tricuspid valve regurgitation is mild . No evidence of tricuspid stenosis. Aortic Valve: The aortic valve is tricuspid. Aortic valve regurgitation is trivial. No aortic stenosis is present. Pulmonic Valve: The pulmonic valve was normal in structure. Pulmonic valve regurgitation is moderate. No evidence of pulmonic stenosis. Aorta: The aortic root and ascending aorta are structurally normal, with no evidence of dilitation. IAS/Shunts: No atrial level shunt detected by color flow Doppler. Additional Comments: A device lead is visualized.  LEFT VENTRICLE PLAX 2D LVIDd:         3.90 cm     Diastology LVIDs:         3.40 cm     LV e' medial:    4.25 cm/s LV PW:         1.02 cm     LV E/e' medial:  17.9 LV IVS:  1.07 cm     LV e' lateral:   7.06 cm/s LVOT diam:     2.20 cm     LV E/e' lateral: 10.8 LV SV:         43 LV SV Index:   26 LVOT Area:     3.80 cm  LV Volumes (MOD) LV vol d, MOD A2C: 48.1 ml LV vol d, MOD A4C: 42.4 ml LV vol s, MOD A2C: 31.7 ml LV vol s, MOD A4C: 25.2 ml LV SV MOD A2C:     16.4 ml LV SV MOD A4C:     42.4 ml LV SV MOD BP:      17.9 ml RIGHT VENTRICLE            IVC RV S prime:     7.37 cm/s  IVC diam: 1.50 cm TAPSE (M-mode): 0.8 cm LEFT ATRIUM             Index        RIGHT ATRIUM           Index LA diam:        4.40 cm 2.72 cm/m   RA Area:     22.80 cm LA Vol (A2C):   80.8 ml 49.89 ml/m  RA Volume:   69.70 ml  43.03 ml/m LA Vol (A4C):   65.9 ml 40.69 ml/m LA  Biplane Vol: 75.5 ml 46.62 ml/m  AORTIC VALVE             PULMONIC VALVE LVOT Vmax:   62.50 cm/s  PR End Diast Vel: 1.35 msec LVOT Vmean:  43.400 cm/s LVOT VTI:    0.112 m  AORTA Ao Root diam: 2.80 cm Ao Asc diam:  3.30 cm MITRAL VALVE               TRICUSPID VALVE MV Area (PHT): 4.49 cm    TR Peak grad:   21.0 mmHg MV Decel Time: 169 msec    TR Vmax:        229.00 cm/s MV E velocity: 76.00 cm/s                            SHUNTS                            Systemic VTI:  0.11 m                            Systemic Diam: 2.20 cm Chilton Si MD Electronically signed by Chilton Si MD Signature Date/Time: 01/09/2023/3:34:03 PM    Final     Cardiac Studies   Echo pending  Assessment   Principal Problem:   Near syncope Active Problems:   Dyslipidemia   Permanent atrial fibrillation (HCC)   CAD (coronary artery disease) of artery bypass graft   Acute on chronic systolic CHF (congestive heart failure) (HCC)   Hypothyroidism   Chronic kidney disease, stage 3b (HCC)   Plan   Feeling better today - will resume lasix 40 mg daily (newly increased dose) - echo shows decline in LVEF to 35-40% - ?pacer related- will need to evaluate for possibly CRT-P upgrade as outpatient. Restarted home HF meds, including Entresto, Jardiance and carvedilol. Will add low dose aldactone 12.5 mg daily. Ok to d/c home today from my standpoint. She has follow-up with Dr. Salena Saner on 12/19. Will try  to arrange earlier APP follow-up with Bernadene Person, NP to re-evaluate heart failure.  Time Spent Directly with Patient:  I have spent a total of 25 minutes with the patient reviewing hospital notes, telemetry, EKGs, labs and examining the patient as well as establishing an assessment and plan that was discussed personally with the patient.  > 50% of time was spent in direct patient care.  Length of Stay:  LOS: 0 days   Chrystie Nose, MD, [facility], FACP  [place]  [facility]  Medical Director of the Advanced Lipid  Disorders &  Cardiovascular Risk Reduction Clinic Diplomate of the American Board of Clinical Lipidology Attending Cardiologist  Direct Dial: [phone]  Fax: [phone]  Website:  [email or web address] Nicely Marlise Fahr 01/10/2023, 10:05 AM

## 2023-01-10 NOTE — Progress Notes (Signed)
PT Cancellation Note  Patient Details Name: Stephanie Atkinson MRN: [id number] DOB: [birth year]   Cancelled Treatment:    Reason Eval/Treat Not Completed: Pt dressed and prepared for d/c. RN reports that pt has already ambulated in the hall today. Pt and family member state they have no questions or concerns for PT at this time, and HHPT is set up.    Marylynn Pearson 01/10/2023, 11:38 AM  Conni Slipper, PT, DPT Acute Rehabilitation Services Secure Chat Preferred Office: [phone]

## 2023-01-10 NOTE — Plan of Care (Signed)

## 2023-01-10 NOTE — Progress Notes (Unsigned)
  Care Coordination  Outreach Note  01/10/2023 Name: Stephanie Atkinson MRN: [id number] DOB: [birth year]   Care Coordination Outreach Attempts: An unsuccessful telephone outreach was attempted today to offer the patient information about available care coordination services.  Follow Up Plan:  Additional outreach attempts will be made to offer the patient care coordination information and services.   Encounter Outcome:  No Answer  Burman Nieves, CCMA Care Coordination Care Guide Direct Dial: [phone]

## 2023-01-11 LAB — CUP PACEART REMOTE DEVICE CHECK
Battery Remaining Longevity: 74 mo
Battery Voltage: 2.97 V
Brady Statistic RV Percent Paced: 98.35 %
Date Time Interrogation Session: 20241115124935
Implantable Lead Connection Status: 753985
Implantable Lead Implant Date: 20190522
Implantable Lead Location: 753860
Implantable Lead Model: 5076
Implantable Pulse Generator Implant Date: 20190522
Lead Channel Impedance Value: 342 Ohm
Lead Channel Impedance Value: 456 Ohm
Lead Channel Pacing Threshold Amplitude: 0.625 V
Lead Channel Pacing Threshold Pulse Width: 0.4 ms
Lead Channel Sensing Intrinsic Amplitude: 11.375 mV
Lead Channel Sensing Intrinsic Amplitude: 11.375 mV
Lead Channel Setting Pacing Amplitude: 2.5 V
Lead Channel Setting Pacing Pulse Width: 0.4 ms
Lead Channel Setting Sensing Sensitivity: 1.2 mV
Zone Setting Status: 755011

## 2023-01-13 NOTE — Progress Notes (Unsigned)
  Care Coordination  Outreach Note  01/13/2023 Name: Stephanie Atkinson MRN: [id number] DOB: July 28, 1924   Care Coordination Outreach Attempts: A second unsuccessful outreach was attempted today to offer the patient with information about available care coordination services.  Follow Up Plan:  Additional outreach attempts will be made to offer the patient care coordination information and services.   Encounter Outcome:  No Answer  Burman Nieves, CCMA Care Coordination Care Guide Direct Dial: [phone]

## 2023-01-14 NOTE — Progress Notes (Signed)
  Care Coordination   Note   01/14/2023 Name: Stephanie Atkinson MRN: [id number] DOB: 1924-08-14  Stephanie Atkinson is a 89 y.o. year old female who sees Linus Galas, NP for primary care. I reached out to Caryl Asp by phone today to offer care coordination services.  [name] was given information about Care Coordination services today including:   The Care Coordination services include support from the care team which includes your Nurse Coordinator, Clinical Social Worker, or Pharmacist.  The Care Coordination team is here to help remove barriers to the health concerns and goals most important to you. Care Coordination services are voluntary, and the patient may decline or stop services at any time by request to their care team member.   Care Coordination Consent Status: Patient agreed to services and verbal consent obtained.   Follow up plan:  Telephone appointment with care coordination team member scheduled for:  01/17/2023  Encounter Outcome:  Patient Scheduled from referral   Burman Nieves, [facility] Care Coordination Care Guide Direct Dial: [phone]

## 2023-01-15 ENCOUNTER — Ambulatory Visit: Payer: Medicare PPO | Attending: Physician Assistant | Admitting: Physician Assistant

## 2023-01-15 ENCOUNTER — Encounter: Payer: Self-pay | Admitting: Physician Assistant

## 2023-01-15 VITALS — BP 140/68 | HR 85 | Ht 62.0 in | Wt 131.0 lb

## 2023-01-15 DIAGNOSIS — I10 Essential (primary) hypertension: Secondary | ICD-10-CM

## 2023-01-15 DIAGNOSIS — Z79899 Other long term (current) drug therapy: Secondary | ICD-10-CM

## 2023-01-15 DIAGNOSIS — Z950 Presence of cardiac pacemaker: Secondary | ICD-10-CM

## 2023-01-15 DIAGNOSIS — I4821 Permanent atrial fibrillation: Secondary | ICD-10-CM | POA: Diagnosis not present

## 2023-01-15 DIAGNOSIS — R1032 Left lower quadrant pain: Secondary | ICD-10-CM | POA: Diagnosis not present

## 2023-01-15 DIAGNOSIS — I5022 Chronic systolic (congestive) heart failure: Secondary | ICD-10-CM

## 2023-01-15 DIAGNOSIS — R42 Dizziness and giddiness: Secondary | ICD-10-CM | POA: Diagnosis not present

## 2023-01-15 MED ORDER — FUROSEMIDE 20 MG PO TABS: 20.0000 mg | ORAL_TABLET | Freq: Every day | ORAL | 3 refills | Status: DC

## 2023-01-15 NOTE — Progress Notes (Signed)
Cardiology Office Note:  .   Date:  01/15/2023  ID:  Stephanie Atkinson, DOB 1924/08/07, MRN [id number] PCP: Linus Galas, NP  [facility] Providers Cardiologist:  Nanetta Batty, MD    History of Present Illness: .   Stephanie Atkinson is a 89 y.o. female with PMH of CAD s/p CAD CABG x 4 in 2006, s/p DES-PDA 2012, permanent atrial fibrillation, HFmrEF, mitral valve regurgitation, tricuspid valve regurgitation, syncope s/p loop recorder implantation in 2014, symptomatic bradycardia s/p PPM in 2019, HTN, HLD, CKD stage III and hypothyroidism.  Stent was patent on follow-up cath in October 2012.  Repeat cardiac catheterization in 2014 showed stable coronary anatomy, medical therapy recommended.  She had a history of atrial fibrillation since her CABG.  She was previously on amiodarone, this was later discontinued in the setting of prolonged PR interval.  She has been on Eliquis for chronic anticoagulation therapy.  Patient had a syncopal episode in August 2014.  She underwent permanent pacemaker implantation by Dr. Royann Shivers in May 2019.  She was hospitalized in the setting of acute on chronic systolic heart failure in October 2023.  Echocardiogram at the time showed EF mildly reduced, 40 to 45%.  Repeat echocardiogram in March 2024 showed EF improved to 45 to 50%, normal RV, severe biatrial enlargement, mild to moderate MR and mild to moderate TR.  Patient was last seen by Bernadene Person NP on 01/06/2023 at which time she was concerned about signs of volume overload including bilateral lower extremity pitting edema and orthopnea.  Lasix was permanently increased to 40 mg daily.  2-week basic metabolic panel was recommended.  She ended up presenting to the hospital on 01/08/2023 with dizziness and near syncope.  She was seen by Dr. Rennis Golden in the hospital.  Blood pressure in the emergency room department was quite elevated in the 170s to 190s.  During the hospitalization, her dizziness resolved and did not recur  again.  She was felt to be euvolemic on exam.  Lasix reduced back to 20 mg daily.  Although patient had orthostatic symptoms, however orthostatic vital sign was negative.  Home Jardiance discontinued.  Repeat echocardiogram obtained on 01/09/2023 demonstrated EF 35 to 40%, abnormal septal motion consistent with RV pacemaker, severe biatrial enlargement, trivial MR, trivial AI.  Patient presents today for follow-up by daughter.  Since discharge, she had a 2 episodes of orthopnea recently for which she required extra dose of diuretic.  On physical exam, she actually appears to be euvolemic.  She continued to have occasional orthostatic dizziness despite recent negative orthostatic vital sign.  I recommended increase her diuretic slowly to 40 mg Monday Wednesday Friday and 20 mg on all the other days.  She will need a basic metabolic panel in 2 weeks and her follow-up with Dr. Royann Shivers is in 1 month.  I encouraged her to drink between 32 to 48 ounces of fluid per day.  ROS:   She complains of occasional dizziness, she has no lower extremity edema, but has occasional orthopnea.  Studies Reviewed: .        Cardiac Studies & Procedures     STRESS TESTS  NM MYOCAR MULTI W/SPECT W 11/29/2008   ECHOCARDIOGRAM  ECHOCARDIOGRAM COMPLETE 01/09/2023  Narrative ECHOCARDIOGRAM REPORT    Patient Name:   Stephanie Atkinson Date of Exam: 01/09/2023 Medical Rec #:  [id number]     Height:       62.0 in Accession #:    [id number]    Weight:  135.4 lb Date of Birth:  20-Jun-1924     BSA:          1.620 m Patient Age:    89 years      BP:           145/61 mmHg Patient Gender: F             HR:           61 bpm. Exam Location:  Inpatient  Procedure: 2D Echo, Color Doppler and Cardiac Doppler  Indications:    R55 Syncope  History:        Patient has prior history of Echocardiogram examinations, most recent 05/21/2022. CHF, CAD and Previous Myocardial Infarction, Abnormal ECG, Prior CABG and Pacemaker;  Signs/Symptoms:Syncope.  Sonographer:    Sheralyn Boatman RDCS Referring Phys: [id number] RONDELL A SMITH  IMPRESSIONS   1. Abnormal (paradoxical) septal motion, consistent with RV pacemaker. . Left ventricular ejection fraction, by estimation, is 35 to 40%. The left ventricle has moderately decreased function. The left ventricle demonstrates global hypokinesis. There is mild left ventricular hypertrophy. Left ventricular diastolic parameters are indeterminate. 2. Right ventricular systolic function is normal. The right ventricular size is normal. There is normal pulmonary artery systolic pressure. 3. Left atrial size was severely dilated. 4. Right atrial size was severely dilated. 5. The mitral valve is degenerative. Trivial mitral valve regurgitation. No evidence of mitral stenosis. 6. The aortic valve is tricuspid. Aortic valve regurgitation is trivial. No aortic stenosis is present. 7. Pulmonic valve regurgitation is moderate.  FINDINGS Left Ventricle: Abnormal (paradoxical) septal motion, consistent with RV pacemaker. Left ventricular ejection fraction, by estimation, is 35 to 40%. The left ventricle has moderately decreased function. The left ventricle demonstrates global hypokinesis. The left ventricular internal cavity size was normal in size. There is mild left ventricular hypertrophy. Left ventricular diastolic function could not be evaluated due to atrial fibrillation. Left ventricular diastolic parameters are indeterminate. Indeterminate filling pressures.  Right Ventricle: The right ventricular size is normal. No increase in right ventricular wall thickness. Right ventricular systolic function is normal. There is normal pulmonary artery systolic pressure. The tricuspid regurgitant velocity is 2.29 m/s, and with an assumed right atrial pressure of 3 mmHg, the estimated right ventricular systolic pressure is 24.0 mmHg.  Left Atrium: Left atrial size was severely dilated.  Right Atrium:  Right atrial size was severely dilated.  Pericardium: Trivial pericardial effusion is present.  Mitral Valve: The mitral valve is degenerative in appearance. Trivial mitral valve regurgitation. No evidence of mitral valve stenosis.  Tricuspid Valve: The tricuspid valve is normal in structure. Tricuspid valve regurgitation is mild . No evidence of tricuspid stenosis.  Aortic Valve: The aortic valve is tricuspid. Aortic valve regurgitation is trivial. No aortic stenosis is present.  Pulmonic Valve: The pulmonic valve was normal in structure. Pulmonic valve regurgitation is moderate. No evidence of pulmonic stenosis.  Aorta: The aortic root and ascending aorta are structurally normal, with no evidence of dilitation.  IAS/Shunts: No atrial level shunt detected by color flow Doppler.  Additional Comments: A device lead is visualized.   LEFT VENTRICLE PLAX 2D LVIDd:         3.90 cm     Diastology LVIDs:         3.40 cm     LV e' medial:    4.25 cm/s LV PW:         1.02 cm     LV E/e' medial:  17.9 LV IVS:  1.07 cm     LV e' lateral:   7.06 cm/s LVOT diam:     2.20 cm     LV E/e' lateral: 10.8 LV SV:         43 LV SV Index:   26 LVOT Area:     3.80 cm  LV Volumes (MOD) LV vol d, MOD A2C: 48.1 ml LV vol d, MOD A4C: 42.4 ml LV vol s, MOD A2C: 31.7 ml LV vol s, MOD A4C: 25.2 ml LV SV MOD A2C:     16.4 ml LV SV MOD A4C:     42.4 ml LV SV MOD BP:      17.9 ml  RIGHT VENTRICLE            IVC RV S prime:     7.37 cm/s  IVC diam: 1.50 cm TAPSE (M-mode): 0.8 cm  LEFT ATRIUM             Index        RIGHT ATRIUM           Index LA diam:        4.40 cm 2.72 cm/m   RA Area:     22.80 cm LA Vol (A2C):   80.8 ml 49.89 ml/m  RA Volume:   69.70 ml  43.03 ml/m LA Vol (A4C):   65.9 ml 40.69 ml/m LA Biplane Vol: 75.5 ml 46.62 ml/m AORTIC VALVE             PULMONIC VALVE LVOT Vmax:   62.50 cm/s  PR End Diast Vel: 1.35 msec LVOT Vmean:  43.400 cm/s LVOT VTI:    0.112  m  AORTA Ao Root diam: 2.80 cm Ao Asc diam:  3.30 cm  MITRAL VALVE               TRICUSPID VALVE MV Area (PHT): 4.49 cm    TR Peak grad:   21.0 mmHg MV Decel Time: 169 msec    TR Vmax:        229.00 cm/s MV E velocity: 76.00 cm/s SHUNTS Systemic VTI:  0.11 m Systemic Diam: 2.20 cm  Chilton Si MD Electronically signed by Chilton Si MD Signature Date/Time: 01/09/2023/3:34:03 PM    Final             Risk Assessment/Calculations:    CHA2DS2-VASc Score = 6   This indicates a 9.7% annual risk of stroke. The patient's score is based upon: CHF History: 1 HTN History: 1 Diabetes History: 0 Stroke History: 0 Vascular Disease History: 1 Age Score: 2 Gender Score: 1           Physical Exam:   VS:  BP (!) 140/68 (BP Location: Left Arm, Patient Position: Sitting, Cuff Size: Normal)   Pulse 85   Ht 5\' 2"  (1.575 m)   Wt 131 lb (59.4 kg)   BMI 23.96 kg/m    Wt Readings from Last 3 Encounters:  01/15/23 131 lb (59.4 kg)  01/10/23 124 lb 5.4 oz (56.4 kg)  01/06/23 135 lb 6.4 oz (61.4 kg)    GEN: Well nourished, well developed in no acute distress NECK: No JVD; No carotid bruits CARDIAC: RRR, no murmurs, rubs, gallops RESPIRATORY:  Clear to auscultation without rales, wheezing or rhonchi  ABDOMEN: Soft, non-tender, non-distended EXTREMITIES:  No edema; No deformity   ASSESSMENT AND PLAN: .     Chronic Systolic Heart Failure EF 35-40% with severe biatrial enlargement. Recent hospitalization with presyncope and dizziness. Lasix was  increased to 40mg  daily but then reduced back to 20mg  due to dizziness. Bilateral edema and shortness of breath reported. -No plan for ischemic workup at this time given lack of chest pain and her advanced age which makes her not a candidate for any invasive workup. -Increase Lasix to 40mg  on Monday, Wednesday, and Friday; maintain 20mg  on other days. -Return in 2 weeks for basic metabolic panel to monitor for signs of  dehydration.  Permanent Atrial Fibrillation On Eliquis for chronic anticoagulation. -Continue Eliquis.  Hypertension Recent elevated blood pressure in the 170-190s. -Continue Carvedilol and Entresto.  Orthostatic Dizziness Recent episodes of dizziness, particularly with changes in body position. -Advise patient to monitor for increased dizziness with new Lasix regimen and to report any changes. -Recommend compression stockings.  Abdominal Pain Recent onset of left lower quadrant pain, possibly related to constipation. -Advise patient to take milk of magnesium and maintain regular use of stool softener.  Pacemaker -Followed by Dr. Royann Shivers        Dispo: Follow up with Dr. Royann Shivers in 1 month.  Basic metabolic panel in 2 weeks.  Signed, Azalee Course, PA

## 2023-01-15 NOTE — Patient Instructions (Signed)
Medication Instructions:  INCREASE LASIX TO 40 MG ON MONDAY WEDNESDAY, FRIDAY AND THEN 20 MG DAILY ON THE OTHER DAY OF THE WEEK.  *If you need a refill on your cardiac medications before your next appointment, please call your pharmacy*   Lab Work: BMP IN 2 WEEKS If you have labs (blood work) drawn today and your tests are completely normal, you will receive your results only by: MyChart Message (if you have MyChart) OR A paper copy in the mail If you have any lab test that is abnormal or we need to change your treatment, we will call you to review the results.   Testing/Procedures: NO TESTING   Follow-Up: At [facility], you and your health needs are our priority.  As part of our continuing mission to provide you with exceptional heart care, we have created designated Provider Care Teams.  These Care Teams include your primary Cardiologist (physician) and Advanced Practice Providers (APPs -  Physician Assistants and Nurse Practitioners) who all work together to provide you with the care you need, when you need it.  Your next appointment:   KEEP FOLLOW UP February 13 2023  Provider:   Thurmon Fair, MD

## 2023-01-16 ENCOUNTER — Other Ambulatory Visit: Payer: Self-pay

## 2023-01-16 ENCOUNTER — Encounter (HOSPITAL_COMMUNITY): Payer: Self-pay

## 2023-01-16 ENCOUNTER — Emergency Department (HOSPITAL_COMMUNITY): Payer: Medicare PPO

## 2023-01-16 ENCOUNTER — Observation Stay (HOSPITAL_COMMUNITY)
Admission: EM | Admit: 2023-01-16 | Discharge: 2023-01-18 | Disposition: A | Discharge status: Home Health | Payer: Medicare PPO | Attending: Family Medicine | Admitting: Family Medicine

## 2023-01-16 DIAGNOSIS — G9341 Metabolic encephalopathy: Secondary | ICD-10-CM | POA: Diagnosis not present

## 2023-01-16 DIAGNOSIS — R918 Other nonspecific abnormal finding of lung field: Secondary | ICD-10-CM | POA: Diagnosis not present

## 2023-01-16 DIAGNOSIS — E7800 Pure hypercholesterolemia, unspecified: Secondary | ICD-10-CM | POA: Diagnosis not present

## 2023-01-16 DIAGNOSIS — R2689 Other abnormalities of gait and mobility: Secondary | ICD-10-CM | POA: Diagnosis not present

## 2023-01-16 DIAGNOSIS — Z950 Presence of cardiac pacemaker: Secondary | ICD-10-CM | POA: Insufficient documentation

## 2023-01-16 DIAGNOSIS — Z955 Presence of coronary angioplasty implant and graft: Secondary | ICD-10-CM | POA: Diagnosis not present

## 2023-01-16 DIAGNOSIS — R531 Weakness: Secondary | ICD-10-CM | POA: Diagnosis not present

## 2023-01-16 DIAGNOSIS — E785 Hyperlipidemia, unspecified: Secondary | ICD-10-CM | POA: Diagnosis present

## 2023-01-16 DIAGNOSIS — Z7901 Long term (current) use of anticoagulants: Secondary | ICD-10-CM | POA: Diagnosis not present

## 2023-01-16 DIAGNOSIS — I5042 Chronic combined systolic (congestive) and diastolic (congestive) heart failure: Secondary | ICD-10-CM | POA: Diagnosis not present

## 2023-01-16 DIAGNOSIS — R001 Bradycardia, unspecified: Secondary | ICD-10-CM | POA: Diagnosis present

## 2023-01-16 DIAGNOSIS — E039 Hypothyroidism, unspecified: Secondary | ICD-10-CM | POA: Diagnosis present

## 2023-01-16 DIAGNOSIS — R55 Syncope and collapse: Secondary | ICD-10-CM | POA: Diagnosis not present

## 2023-01-16 DIAGNOSIS — M1990 Unspecified osteoarthritis, unspecified site: Secondary | ICD-10-CM | POA: Diagnosis not present

## 2023-01-16 DIAGNOSIS — I2510 Atherosclerotic heart disease of native coronary artery without angina pectoris: Secondary | ICD-10-CM | POA: Diagnosis not present

## 2023-01-16 DIAGNOSIS — N1832 Chronic kidney disease, stage 3b: Secondary | ICD-10-CM | POA: Diagnosis not present

## 2023-01-16 DIAGNOSIS — Z951 Presence of aortocoronary bypass graft: Secondary | ICD-10-CM

## 2023-01-16 DIAGNOSIS — R4182 Altered mental status, unspecified: Secondary | ICD-10-CM | POA: Diagnosis not present

## 2023-01-16 DIAGNOSIS — M545 Low back pain, unspecified: Secondary | ICD-10-CM | POA: Diagnosis not present

## 2023-01-16 DIAGNOSIS — F17290 Nicotine dependence, other tobacco product, uncomplicated: Secondary | ICD-10-CM | POA: Diagnosis not present

## 2023-01-16 DIAGNOSIS — I4821 Permanent atrial fibrillation: Secondary | ICD-10-CM | POA: Diagnosis not present

## 2023-01-16 DIAGNOSIS — I5032 Chronic diastolic (congestive) heart failure: Secondary | ICD-10-CM | POA: Diagnosis present

## 2023-01-16 DIAGNOSIS — I10 Essential (primary) hypertension: Secondary | ICD-10-CM | POA: Diagnosis present

## 2023-01-16 DIAGNOSIS — I700 Atherosclerosis of aorta: Secondary | ICD-10-CM | POA: Diagnosis not present

## 2023-01-16 DIAGNOSIS — I517 Cardiomegaly: Secondary | ICD-10-CM | POA: Diagnosis not present

## 2023-01-16 DIAGNOSIS — Z79899 Other long term (current) drug therapy: Secondary | ICD-10-CM | POA: Diagnosis not present

## 2023-01-16 LAB — COMPREHENSIVE METABOLIC PANEL
ALT: 26 U/L (ref 0–44)
AST: 29 U/L (ref 15–41)
Albumin: 3.4 g/dL — ABNORMAL LOW (ref 3.5–5.0)
Alkaline Phosphatase: 59 U/L (ref 38–126)
Anion gap: 7 (ref 5–15)
BUN: 16 mg/dL (ref 8–23)
CO2: 24 mmol/L (ref 22–32)
Calcium: 11 mg/dL — ABNORMAL HIGH (ref 8.9–10.3)
Chloride: 108 mmol/L (ref 98–111)
Creatinine, Ser: 1.1 mg/dL — ABNORMAL HIGH (ref 0.44–1.00)
GFR, Estimated: 46 mL/min — ABNORMAL LOW (ref >60)
Glucose, Bld: 84 mg/dL (ref 70–99)
Potassium: 3.7 mmol/L (ref 3.5–5.1)
Sodium: 139 mmol/L (ref 135–145)
Total Bilirubin: 1.3 mg/dL — ABNORMAL HIGH (ref <1.2)
Total Protein: 6.3 g/dL — ABNORMAL LOW (ref 6.5–8.1)

## 2023-01-16 LAB — CBC WITH DIFFERENTIAL/PLATELET
Abs Immature Granulocytes: 0.01 10*3/uL (ref 0.00–0.07)
Basophils Absolute: 0 10*3/uL (ref 0.0–0.1)
Basophils Relative: 0 %
Eosinophils Absolute: 0 10*3/uL (ref 0.0–0.5)
Eosinophils Relative: 1 %
HCT: 40.7 % (ref 36.0–46.0)
Hemoglobin: 14.8 g/dL (ref 12.0–15.0)
Immature Granulocytes: 0 %
Lymphocytes Relative: 29 %
Lymphs Abs: 1 10*3/uL (ref 0.7–4.0)
MCH: 35.4 pg — ABNORMAL HIGH (ref 26.0–34.0)
MCHC: 36.4 g/dL — ABNORMAL HIGH (ref 30.0–36.0)
MCV: 97.4 fL (ref 80.0–100.0)
Monocytes Absolute: 0.3 10*3/uL (ref 0.1–1.0)
Monocytes Relative: 10 %
Neutro Abs: 2 10*3/uL (ref 1.7–7.7)
Neutrophils Relative %: 60 %
Platelets: 174 10*3/uL (ref 150–400)
RBC: 4.18 MIL/uL (ref 3.87–5.11)
RDW: 16.2 % — ABNORMAL HIGH (ref 11.5–15.5)
WBC: 3.4 10*3/uL — ABNORMAL LOW (ref 4.0–10.5)
nRBC: 0 % (ref 0.0–0.2)

## 2023-01-16 LAB — PROTIME-INR
INR: 1.2 (ref 0.8–1.2)
Prothrombin Time: 15.5 s — ABNORMAL HIGH (ref 11.4–15.2)

## 2023-01-16 LAB — TROPONIN I (HIGH SENSITIVITY)
Troponin I (High Sensitivity): 24 ng/L — ABNORMAL HIGH (ref <18)
Troponin I (High Sensitivity): 25 ng/L — ABNORMAL HIGH (ref <18)

## 2023-01-16 LAB — I-STAT CG4 LACTIC ACID, ED
Lactic Acid, Venous: 1.7 mmol/L (ref 0.5–1.9)
Lactic Acid, Venous: 1.8 mmol/L (ref 0.5–1.9)

## 2023-01-16 LAB — BRAIN NATRIURETIC PEPTIDE: B Natriuretic Peptide: 499.1 pg/mL — ABNORMAL HIGH (ref 0.0–100.0)

## 2023-01-16 LAB — MAGNESIUM: Magnesium: 2.5 mg/dL — ABNORMAL HIGH (ref 1.7–2.4)

## 2023-01-16 LAB — APTT: aPTT: 34 s (ref 24–36)

## 2023-01-16 LAB — AMMONIA: Ammonia: 18 umol/L (ref 9–35)

## 2023-01-16 MED ORDER — SPIRONOLACTONE 12.5 MG HALF TABLET
12.5000 mg | ORAL_TABLET | Freq: Every day | ORAL | Status: DC
  Administered 2023-01-16 – 2023-01-18 (×3): 12.5 mg via ORAL
  Filled 2023-01-16 (×3): qty 1

## 2023-01-16 MED ORDER — ONDANSETRON HCL 4 MG PO TABS: 4.0000 mg | ORAL_TABLET | Freq: Four times a day (QID) | ORAL | Status: DC | PRN

## 2023-01-16 MED ORDER — SENNA 8.6 MG PO TABS
1.0000 | ORAL_TABLET | Freq: Two times a day (BID) | ORAL | Status: DC
  Administered 2023-01-16 – 2023-01-18 (×4): 8.6 mg via ORAL
  Filled 2023-01-16 (×4): qty 1

## 2023-01-16 MED ORDER — HYDROCODONE-ACETAMINOPHEN 5-325 MG PO TABS
1.0000 | ORAL_TABLET | Freq: Once | ORAL | Status: AC
  Administered 2023-01-16: 1 via ORAL
  Filled 2023-01-16: qty 1

## 2023-01-16 MED ORDER — SODIUM CHLORIDE 0.9 % IV SOLN: INTRAVENOUS | Status: AC

## 2023-01-16 MED ORDER — APIXABAN 2.5 MG PO TABS
2.5000 mg | ORAL_TABLET | Freq: Two times a day (BID) | ORAL | Status: DC
Start: 2023-01-16 — End: 2023-01-18
  Administered 2023-01-16 – 2023-01-18 (×4): 2.5 mg via ORAL
  Filled 2023-01-16 (×4): qty 1

## 2023-01-16 MED ORDER — ONDANSETRON HCL 4 MG/2ML IJ SOLN: 4.0000 mg | Freq: Four times a day (QID) | INTRAMUSCULAR | Status: DC | PRN

## 2023-01-16 MED ORDER — FUROSEMIDE 20 MG PO TABS: 20.0000 mg | ORAL_TABLET | Freq: Every day | ORAL | Status: DC

## 2023-01-16 MED ORDER — ONDANSETRON HCL 4 MG/2ML IJ SOLN
4.0000 mg | Freq: Once | INTRAMUSCULAR | Status: AC
  Administered 2023-01-16: 4 mg via INTRAVENOUS
  Filled 2023-01-16: qty 2

## 2023-01-16 MED ORDER — OXYCODONE HCL 5 MG PO TABS
5.0000 mg | ORAL_TABLET | ORAL | Status: DC | PRN
  Administered 2023-01-16 – 2023-01-18 (×3): 5 mg via ORAL
  Filled 2023-01-16 (×4): qty 1

## 2023-01-16 MED ORDER — SACUBITRIL-VALSARTAN 49-51 MG PO TABS
1.0000 | ORAL_TABLET | Freq: Two times a day (BID) | ORAL | Status: DC
Start: 2023-01-16 — End: 2023-01-18
  Administered 2023-01-16 – 2023-01-18 (×4): 1 via ORAL
  Filled 2023-01-16 (×4): qty 1

## 2023-01-16 MED ORDER — DOCUSATE SODIUM 100 MG PO CAPS
100.0000 mg | ORAL_CAPSULE | Freq: Two times a day (BID) | ORAL | Status: DC
  Administered 2023-01-16 – 2023-01-18 (×4): 100 mg via ORAL
  Filled 2023-01-16 (×4): qty 1

## 2023-01-16 MED ORDER — TRAMADOL HCL 50 MG PO TABS
50.0000 mg | ORAL_TABLET | Freq: Two times a day (BID) | ORAL | Status: DC
  Administered 2023-01-16 – 2023-01-18 (×4): 50 mg via ORAL
  Filled 2023-01-16 (×4): qty 1

## 2023-01-16 MED ORDER — LEVOTHYROXINE SODIUM 50 MCG PO TABS
50.0000 ug | ORAL_TABLET | Freq: Every day | ORAL | Status: DC
  Administered 2023-01-17 – 2023-01-18 (×2): 50 ug via ORAL
  Filled 2023-01-16 (×2): qty 1

## 2023-01-16 MED ORDER — ACETAMINOPHEN 325 MG PO TABS
650.0000 mg | ORAL_TABLET | Freq: Four times a day (QID) | ORAL | Status: DC | PRN
  Administered 2023-01-16: 650 mg via ORAL
  Filled 2023-01-16: qty 2

## 2023-01-16 MED ORDER — ACETAMINOPHEN 650 MG RE SUPP: 650.0000 mg | Freq: Four times a day (QID) | RECTAL | Status: DC | PRN

## 2023-01-16 MED ORDER — CARVEDILOL 3.125 MG PO TABS
3.1250 mg | ORAL_TABLET | Freq: Two times a day (BID) | ORAL | Status: DC
  Administered 2023-01-16 – 2023-01-18 (×4): 3.125 mg via ORAL
  Filled 2023-01-16 (×4): qty 1

## 2023-01-16 NOTE — ED Notes (Addendum)
Report given to Michelle, RN

## 2023-01-16 NOTE — ED Provider Notes (Signed)
[place] EMERGENCY DEPARTMENT AT [facility] Provider Note   CSN: [id number] Arrival date & time: 01/16/23  1008     History  Chief Complaint  Patient presents with   Altered Mental Status    Stephanie Atkinson is a 89 y.o. female.  She is brought in by ambulance from home.  She lives alone and was last seen well at 8 PM last night.  Family found her this morning less responsive.  Concern she had a fever and she had vomited once in bed.  Was discharged a week ago after being admitted for near syncope, had decline in ejection fraction to 35 to 40%.  Level 5 caveat secondary to altered mental status  The history is provided by the patient and the EMS personnel.  Altered Mental Status Presenting symptoms: lethargy and partial responsiveness   Most recent episode:  Today Timing:  Constant Progression:  Unchanged Chronicity:  New Context: recent change in medication and recent illness   Associated symptoms: nausea and vomiting   Associated symptoms: no abdominal pain        Home Medications Prior to Admission medications   Medication Sig Start Date End Date Taking? Authorizing Provider  acetaminophen (TYLENOL) 500 MG tablet Take 1 tablet (500 mg total) by mouth every 6 (six) hours as needed for moderate pain. Patient taking differently: Take 1,000 mg by mouth 3 (three) times daily. 06/27/19   Briant Cedar, MD  atorvastatin (LIPITOR) 80 MG tablet Take 80 mg by mouth at bedtime.     [provider]  carvedilol (COREG) 3.125 MG tablet Take 1 tablet (3.125 mg total) by mouth 2 (two) times daily. 04/10/22   Runell Gess, MD  ELIQUIS 2.5 MG TABS tablet TAKE 1 TABLET BY MOUTH TWICE A DAY 09/26/22   Runell Gess, MD  furosemide (LASIX) 20 MG tablet Take 1 tablet (20 mg total) by mouth daily. Take two tablets in case of weight gain 2 to 3 lbs in 24 hrs or 5 lbs in 7 days, until weight back to baseline. 01/15/23 04/15/23  Azalee Course, PA  levothyroxine (SYNTHROID,  LEVOTHROID) 50 MCG tablet Take 50 mcg by mouth daily before breakfast.     [provider]  Polyethyl Glycol-Propyl Glycol (SYSTANE OP) Place 1 drop into both eyes daily as needed (dry eye).    [provider]  sacubitril-valsartan (ENTRESTO) 49-51 MG Take 1 tablet by mouth 2 (two) times daily. 02/27/22   Croitoru, Mihai, MD  spironolactone (ALDACTONE) 25 MG tablet Take 0.5 tablets (12.5 mg total) by mouth daily. 01/11/23 02/10/23  Arrien, York Ram, MD  traMADol (ULTRAM) 50 MG tablet Take 1 tablet (50 mg total) by mouth 2 (two) times daily. Patient taking differently: Take 50 mg by mouth 3 (three) times daily. 10/15/22   Tarry Kos, MD  VITAMIN D3 50 MCG (2000 UT) capsule Take 1 capsule by mouth once a week. 02/03/22   [provider]      Allergies    Penicillins, Sulfa antibiotics, Gabapentin, and Lyrica [pregabalin]    Review of Systems   Review of Systems  Unable to perform ROS: Mental status change  Gastrointestinal:  Positive for nausea and vomiting. Negative for abdominal pain.    Physical Exam Updated Vital Signs BP (!) 191/88 (BP Location: Right Arm)   Pulse 75   Resp (!) 22   Ht 5\' 2"  (1.575 m)   Wt 60 kg   SpO2 100%   BMI 24.19  kg/m  Physical Exam Vitals and nursing note reviewed.  Constitutional:      General: She is in acute distress.     Appearance: Normal appearance. She is well-developed.  HENT:     Head: Normocephalic and atraumatic.  Eyes:     Conjunctiva/sclera: Conjunctivae normal.  Cardiovascular:     Rate and Rhythm: Normal rate and regular rhythm.     Heart sounds: Murmur heard.  Pulmonary:     Effort: Pulmonary effort is normal. No respiratory distress.     Breath sounds: Normal breath sounds.  Abdominal:     Palpations: Abdomen is soft.     Tenderness: There is no abdominal tenderness. There is no guarding or rebound.  Musculoskeletal:        General: No deformity.     Cervical back: Neck supple.     Right  lower leg: No edema.     Left lower leg: No edema.  Skin:    General: Skin is warm and dry.     Capillary Refill: Capillary refill takes less than 2 seconds.  Neurological:     General: No focal deficit present.     Comments: She is awake.  She will answer some very simple yes/no questions.  She is moving all extremities.  She is generally weak no gross facial asymmetry.     ED Results / Procedures / Treatments   Labs (all labs ordered are listed, but only abnormal results are displayed) Labs Reviewed  CBC WITH DIFFERENTIAL/PLATELET - Abnormal; Notable for the following components:      Result Value   WBC 3.4 (*)    MCH 35.4 (*)    MCHC 36.4 (*)    RDW 16.2 (*)    All other components within normal limits  BRAIN NATRIURETIC PEPTIDE - Abnormal; Notable for the following components:   B Natriuretic Peptide 499.1 (*)    All other components within normal limits  COMPREHENSIVE METABOLIC PANEL - Abnormal; Notable for the following components:   Creatinine, Ser 1.10 (*)    Calcium 11.0 (*)    Total Protein 6.3 (*)    Albumin 3.4 (*)    Total Bilirubin 1.3 (*)    GFR, Estimated 46 (*)    All other components within normal limits  MAGNESIUM - Abnormal; Notable for the following components:   Magnesium 2.5 (*)    All other components within normal limits  PROTIME-INR - Abnormal; Notable for the following components:   Prothrombin Time 15.5 (*)    All other components within normal limits  TROPONIN I (HIGH SENSITIVITY) - Abnormal; Notable for the following components:   Troponin I (High Sensitivity) 24 (*)    All other components within normal limits  TROPONIN I (HIGH SENSITIVITY) - Abnormal; Notable for the following components:   Troponin I (High Sensitivity) 25 (*)    All other components within normal limits  CULTURE, BLOOD (ROUTINE X 2)  CULTURE, BLOOD (ROUTINE X 2)  AMMONIA  APTT  URINALYSIS, W/ REFLEX TO CULTURE (INFECTION SUSPECTED)  I-STAT CG4 LACTIC ACID, ED  I-STAT  CG4 LACTIC ACID, ED    EKG None EKG not crossing in epic.  Paced rhythm Radiology CT Head Wo Contrast  Result Date: 01/16/2023 CLINICAL DATA:  Mental status change, unknown cause EXAM: CT HEAD WITHOUT CONTRAST TECHNIQUE: Contiguous axial images were obtained from the base of the skull through the vertex without intravenous contrast. RADIATION DOSE REDUCTION: This exam was performed according to the departmental dose-optimization program which includes  automated exposure control, adjustment of the mA and/or kV according to patient size and/or use of iterative reconstruction technique. COMPARISON:  Head CT 01/08/2023 FINDINGS: Brain: No hemorrhage. No hydrocephalus. No extra-axial fluid collection. No CT evidence of an acute cortical infarct. No mass effect. No mass lesion. There are chronic infarcts in the left thalamus, left basal ganglia, and superior right cerebellum Vascular: No hyperdense vessel or unexpected calcification. Skull: Normal. Negative for fracture or focal lesion. Sinuses/Orbits: No middle ear or mastoid effusion. Paranasal sinuses are notable for mild mucosal thickening in the right maxillary sinus. Bilateral lens replacement. Orbits are otherwise unremarkable. Other: None. IMPRESSION: No acute intracranial abnormality Electronically Signed   By: Lorenza Cambridge M.D.   On: 01/16/2023 12:08   DG Chest Port 1 View  Result Date: 01/16/2023 CLINICAL DATA:  89 year old female with possible sepsis. EXAM: PORTABLE CHEST 1 VIEW COMPARISON:  Portable chest 01/08/2023 and earlier. FINDINGS: Portable AP semi upright view at 1031 hours. Chronic left chest cardiac pacemaker. Stable cardiomegaly and mediastinal contours. Calcified aortic atherosclerosis. Sequelae of CABG. Stable lung volumes. No pneumothorax. Increased pulmonary vascularity without overt edema. No consolidation. Difficult to exclude small pleural effusions. Negative visible bowel gas. Stable visualized osseous structures.  IMPRESSION: 1. Chronic cardiomegaly with increased pulmonary vascularity from earlier this month but no overt edema. Difficult to exclude small pleural effusions. 2.  Aortic Atherosclerosis (ICD10-I70.0). Electronically Signed   By: Odessa Fleming M.D.   On: 01/16/2023 11:26    Procedures Procedures    Medications Ordered in ED Medications  traMADol (ULTRAM) tablet 50 mg (50 mg Oral Given 01/16/23 2006)  carvedilol (COREG) tablet 3.125 mg (has no administration in time range)  sacubitril-valsartan (ENTRESTO) 49-51 mg per tablet (has no administration in time range)  spironolactone (ALDACTONE) tablet 12.5 mg (12.5 mg Oral Given 01/16/23 2006)  levothyroxine (SYNTHROID) tablet 50 mcg (has no administration in time range)  apixaban (ELIQUIS) tablet 2.5 mg (has no administration in time range)  0.9 %  sodium chloride infusion ( Intravenous New Bag/Given 01/16/23 2010)  acetaminophen (TYLENOL) tablet 650 mg (650 mg Oral Given 01/16/23 2006)    Or  acetaminophen (TYLENOL) suppository 650 mg ( Rectal See Alternative 01/16/23 2006)  oxyCODONE (Oxy IR/ROXICODONE) immediate release tablet 5 mg (has no administration in time range)  docusate sodium (COLACE) capsule 100 mg (has no administration in time range)  senna (SENOKOT) tablet 8.6 mg (has no administration in time range)  ondansetron (ZOFRAN) tablet 4 mg (has no administration in time range)    Or  ondansetron (ZOFRAN) injection 4 mg (has no administration in time range)  ondansetron (ZOFRAN) injection 4 mg (4 mg Intravenous Given 01/16/23 1104)  HYDROcodone-acetaminophen (NORCO/VICODIN) 5-325 MG per tablet 1 tablet (1 tablet Oral Given 01/16/23 1303)    ED Course/ Medical Decision Making/ A&P Clinical Course as of 01/16/23 2030  Thu Jan 16, 2023  1100 Chest x-ray interpreted by me as cardiomegaly no gross infiltrate.  Awaiting radiology reading. [MB]  1236 Patient is now awake alert.  She has no recollection of anything since last night.   Daughter is here and says she is back to baseline.  Daughter says she is deals with chronic pain in her joints and is waiting to see a pain specialist in December.  Patient is complaining of pain in her knees.  Asking for something to eat and a pain pill. [MB]  1623 Discussed with [facility] hospitalist who will evaluate patient for admission. [MB]    Clinical Course User  Index [MB] Terrilee Files, MD                                 Medical Decision Making Amount and/or Complexity of Data Reviewed Labs: ordered. Radiology: ordered. ECG/medicine tests: ordered.  Risk Prescription drug management. Decision regarding hospitalization.   This patient complains of decreased responsiveness vomiting altered mental status; this involves an extensive number of treatment Options and is a complaint that carries with it a high risk of complications and morbidity. The differential includes stroke, bleed, hypoglycemia, metabolic derangement, intoxication, sepsis  I ordered, reviewed and interpreted labs, which included CBC with mildly low white count, chemistries with elevated calcium, troponins mildly elevated but flat, BNP mildly elevated, lactate normal I ordered medication Zofran and oral pain medication for her arthralgias and reviewed PMP when indicated. I ordered imaging studies which included chest x-ray and head CT and I independently    visualized and interpreted imaging which showed no acute findings Additional history obtained from EMS and patient's family Previous records obtained and reviewed in epic including recent discharge summary I consulted [facility] hospitalist and discussed lab and imaging findings and discussed disposition.  Cardiac monitoring reviewed, paced rhythm Social determinants considered, no significant barriers Critical Interventions: None  After the interventions stated above, I reevaluated the patient and found patient to be much more awake alert Admission and further  testing considered, family quite concerned with this abrupt change in her mental status and felt she would be better served by being admitted to the hospital for further evaluation.  I think at her age this is reasonable and if patient hospitalist for admission         Final Clinical Impression(s) / ED Diagnoses Final diagnoses:  Altered mental status, unspecified altered mental status type  Hypercalcemia  Acute metabolic encephalopathy    Rx / DC Orders ED Discharge Orders     None         Terrilee Files, MD 01/16/23 2031

## 2023-01-16 NOTE — ED Triage Notes (Addendum)
Pt to ED via pov from home. Pt lives alone and was seen normal last night at 8pm. When family found her this am, she was only alert to verbal. Pt has a fever and vomted in bed last night. Pt denies any complaints  w/EMS, has been lethargic, will open eyes to verbal. Pt takes eliquis.  EMS VS 176/94 98% RA Pacer 80-100 Cbg 118 ETC02 20-26 20g LAC placed

## 2023-01-16 NOTE — ED Notes (Signed)
ED TO INPATIENT HANDOFF REPORT  ED Nurse Name and Phone #:  Candace Cruise [phone]  S Name/Age/Gender Stephanie Atkinson 89 y.o. female Room/Bed: 004C/004C  Code Status   Code Status: Full Code  Home/SNF/Other Home Patient oriented to: self, place, and time Is this baseline? No   Triage Complete: Triage complete  Chief Complaint Acute metabolic encephalopathy [G93.41]  Triage Note Pt to ED via pov from home. Pt lives alone and was seen normal last night at 8pm. When family found her this am, she was only alert to verbal. Pt has a fever and vomted in bed last night. Pt denies any complaints  w/EMS, has been lethargic, will open eyes to verbal. Pt takes eliquis.  EMS VS 176/94 98% RA Pacer 80-100 Cbg 118 ETC02 20-26 20g LAC placed    Allergies Allergies  Allergen Reactions   Penicillins Hives   Sulfa Antibiotics Hives and Swelling   Gabapentin Other (See Comments)    Confusion   Lyrica [Pregabalin] Other (See Comments)    Confusion    Level of Care/Admitting Diagnosis ED Disposition     ED Disposition  Admit   Condition  --   Comment  Hospital Area: [facility] [[id number]]  Level of Care: Telemetry Cardiac [103]  May admit patient to [facility] or [facility] if equivalent level of care is available:: Yes  Covid Evaluation: Asymptomatic - no recent exposure (last 10 days) testing not required  Diagnosis: Acute metabolic encephalopathy [[id number]]  Admitting Physician: Chevis Pretty  Attending Physician: Chevis Pretty  Certification:: I certify this patient will need inpatient services for at least 2 midnights  Expected Medical Readiness: 01/18/2023          B Medical/Surgery History Past Medical History:  Diagnosis Date   Chronic kidney disease, stage 3 (HCC)    Coronary artery disease    Hx of CABG 2006   LIMA-LAD, free RIMA-PDA, Radial-OM1-OM2   Hyperlipidemia    Hypertension    Hypothyroid    MI, old         PAF (paroxysmal atrial fibrillation) (HCC)    was on amio, d/c'd due to prolonged PR interval, rate control   Presence of permanent cardiac pacemaker 07/16/2017   Presence of stent in right coronary artery 07/12   RIMA-PDA occluded, DES stent placed   Syncope 07/12   bradycardic, beta blocker decreased, also felt to be dehydrated   Past Surgical History:  Procedure Laterality Date   CARDIAC CATHETERIZATION  101512   dr. Onalee Hua harding, revealing a atretic bypass to her right with patent distal RCA stent   CAROTID STENT  2012   CORONARY ARTERY BYPASS GRAFT  2006   LIMA-LAD, free RIMA-PDA, Radial-OM1-OM2   DOPPLER ECHOCARDIOGRAPHY  191478   mild asymmetric left ventricular hypertrophy, left ventricular systolic function is low normal, ejection fraction = 50-55%, the LA is moderate dilated, the RA is mildly dilated, no significant valvular disease   INSERT / REPLACE / REMOVE PACEMAKER  07/16/2017   JOINT REPLACEMENT     hip replacement   LEFT HEART CATHETERIZATION WITH CORONARY/GRAFT ANGIOGRAM N/A 10/12/2012   Procedure: LEFT HEART CATHETERIZATION WITH Isabel Caprice;  Surgeon: Marykay Lex, MD;  Location: [facility] CATH LAB;  Service: Cardiovascular;  Laterality: N/A;   LOOP RECORDER IMPLANT N/A 10/15/2012   Procedure: LOOP RECORDER IMPLANT;  Surgeon: Thurmon Fair, MD;  Location: [facility] CATH LAB;  Service: Cardiovascular;  Laterality: N/A;   LOOP RECORDER REMOVAL  07/16/2017   LOOP  RECORDER REMOVAL N/A 07/16/2017   Procedure: LOOP RECORDER REMOVAL;  Surgeon: Thurmon Fair, MD;  Location: [facility] INVASIVE CV LAB;  Service: Cardiovascular;  Laterality: N/A;   NM MYOVIEW LTD  100510   post stress left ventricle is normal in size, post stress ejection fraction is 67% global left ventricular systolic function is normal, normal myocardial perfusion study, abnormal myocardial perfusion study, low risk scan   PACEMAKER IMPLANT N/A 07/16/2017   Procedure: PACEMAKER IMPLANT;  Surgeon: Thurmon Fair,  MD;  Location: [facility] INVASIVE CV LAB;  Service: Cardiovascular;  Laterality: N/A;   THYROIDECTOMY       A IV Location/Drains/Wounds Patient Lines/Drains/Airways Status     Active Line/Drains/Airways     Name Placement date Placement time Site Days   Peripheral IV 01/16/23 20 G Left Antecubital 01/16/23  1018  Antecubital  less than 1            Intake/Output Last 24 hours No intake or output data in the 24 hours ending 01/16/23 1713  Labs/Imaging Results for orders placed or performed during the hospital encounter of 01/16/23 (from the past 48 hour(s))  CBC with Differential     Status: Abnormal   Collection Time: 01/16/23 10:19 AM  Result Value Ref Range   WBC 3.4 (L) 4.0 - 10.5 K/uL   RBC 4.18 3.87 - 5.11 MIL/uL   Hemoglobin 14.8 12.0 - 15.0 g/dL   HCT 78.4 69.6 - 29.5 %   MCV 97.4 80.0 - 100.0 fL   MCH 35.4 (H) 26.0 - 34.0 pg   MCHC 36.4 (H) 30.0 - 36.0 g/dL   RDW 28.4 (H) 13.2 - 44.0 %   Platelets 174 150 - 400 K/uL   nRBC 0.0 0.0 - 0.2 %   Neutrophils Relative % 60 %   Neutro Abs 2.0 1.7 - 7.7 K/uL   Lymphocytes Relative 29 %   Lymphs Abs 1.0 0.7 - 4.0 K/uL   Monocytes Relative 10 %   Monocytes Absolute 0.3 0.1 - 1.0 K/uL   Eosinophils Relative 1 %   Eosinophils Absolute 0.0 0.0 - 0.5 K/uL   Basophils Relative 0 %   Basophils Absolute 0.0 0.0 - 0.1 K/uL   Immature Granulocytes 0 %   Abs Immature Granulocytes 0.01 0.00 - 0.07 K/uL    Comment: Performed at [facility], [address] [place], [place] [address]  Troponin I (High Sensitivity)     Status: Abnormal   Collection Time: 01/16/23 10:19 AM  Result Value Ref Range   Troponin I (High Sensitivity) 24 (H) <18 ng/L    Comment: (NOTE) Elevated high sensitivity troponin I (hsTnI) values and significant  changes across serial measurements may suggest ACS but many other  chronic and acute conditions are known to elevate hsTnI results.  Refer to the "Links" section for chest pain algorithms and  additional  guidance. Performed at [facility], [address] [place], [place] [address]   Brain natriuretic peptide     Status: Abnormal   Collection Time: 01/16/23 10:19 AM  Result Value Ref Range   B Natriuretic Peptide 499.1 (H) 0.0 - 100.0 pg/mL    Comment: Performed at [facility], [address], [place], [place] [address]  Ammonia     Status: None   Collection Time: 01/16/23 10:39 AM  Result Value Ref Range   Ammonia 18 9 - 35 umol/L    Comment: Performed at [facility], [address], [place], [place]  [address]  I-Stat Lactic Acid, ED     Status: None   Collection Time: 01/16/23 10:55 AM  Result Value Ref Range   Lactic Acid, Venous 1.7 0.5 - 1.9 mmol/L  Comprehensive metabolic panel     Status: Abnormal   Collection Time: 01/16/23 12:00 PM  Result Value Ref Range   Sodium 139 135 - 145 mmol/L   Potassium 3.7 3.5 - 5.1 mmol/L   Chloride 108 98 - 111 mmol/L   CO2 24 22 - 32 mmol/L   Glucose, Bld 84 70 - 99 mg/dL    Comment: Glucose reference range applies only to samples taken after fasting for at least 8 hours.   BUN 16 8 - 23 mg/dL   Creatinine, Ser 9.52 (H) 0.44 - 1.00 mg/dL   Calcium 84.1 (H) 8.9 - 10.3 mg/dL   Total Protein 6.3 (L) 6.5 - 8.1 g/dL   Albumin 3.4 (L) 3.5 - 5.0 g/dL   AST 29 15 - 41 U/L   ALT 26 0 - 44 U/L   Alkaline Phosphatase 59 38 - 126 U/L   Total Bilirubin 1.3 (H) <1.2 mg/dL   GFR, Estimated 46 (L) >60 mL/min    Comment: (NOTE) Calculated using the CKD-EPI Creatinine Equation (2021)    Anion gap 7 5 - 15    Comment: Performed at [facility], [address], [place], [place] [address]  Magnesium     Status: Abnormal   Collection Time: 01/16/23 12:00 PM  Result Value Ref Range   Magnesium 2.5 (H) 1.7 - 2.4 mg/dL    Comment: Performed at [facility], [address] [place], [place] [address]  Troponin I (High Sensitivity)     Status: Abnormal   Collection Time: 01/16/23 12:19 PM  Result Value Ref  Range   Troponin I (High Sensitivity) 25 (H) <18 ng/L    Comment: (NOTE) Elevated high sensitivity troponin I (hsTnI) values and significant  changes across serial measurements may suggest ACS but many other  chronic and acute conditions are known to elevate hsTnI results.  Refer to the "Links" section for chest pain algorithms and additional  guidance. Performed at [facility], [address] [place], [place] [address]   I-Stat Lactic Acid, ED     Status: None   Collection Time: 01/16/23 12:20 PM  Result Value Ref Range   Lactic Acid, Venous 1.8 0.5 - 1.9 mmol/L  Protime-INR     Status: Abnormal   Collection Time: 01/16/23 12:30 PM  Result Value Ref Range   Prothrombin Time 15.5 (H) 11.4 - 15.2 seconds   INR 1.2 0.8 - 1.2    Comment: (NOTE) INR goal varies based on device and disease states. Performed at [facility], [address], [place], [place] [address]   APTT     Status: None   Collection Time: 01/16/23 12:30 PM  Result Value Ref Range   aPTT 34 24 - 36 seconds    Comment: Performed at [facility], [address], [place], [place] [address]   CT Head Wo Contrast  Result Date: 01/16/2023 CLINICAL DATA:  Mental status change, unknown cause EXAM: CT HEAD WITHOUT CONTRAST TECHNIQUE: Contiguous axial images were obtained from the base of the skull through the vertex without intravenous contrast. RADIATION DOSE REDUCTION: This exam was performed according to the departmental dose-optimization program which includes automated exposure control, adjustment of the mA and/or kV according to patient size and/or use of  iterative reconstruction technique. COMPARISON:  Head CT 01/08/2023 FINDINGS: Brain: No hemorrhage. No hydrocephalus. No extra-axial fluid collection. No CT evidence of an acute cortical infarct. No mass effect. No mass lesion. There are chronic infarcts in the left thalamus, left basal ganglia, and superior right cerebellum Vascular: No hyperdense  vessel or unexpected calcification. Skull: Normal. Negative for fracture or focal lesion. Sinuses/Orbits: No middle ear or mastoid effusion. Paranasal sinuses are notable for mild mucosal thickening in the right maxillary sinus. Bilateral lens replacement. Orbits are otherwise unremarkable. Other: None. IMPRESSION: No acute intracranial abnormality Electronically Signed   By: Lorenza Cambridge M.D.   On: 01/16/2023 12:08   DG Chest Port 1 View  Result Date: 01/16/2023 CLINICAL DATA:  89 year old female with possible sepsis. EXAM: PORTABLE CHEST 1 VIEW COMPARISON:  Portable chest 01/08/2023 and earlier. FINDINGS: Portable AP semi upright view at 1031 hours. Chronic left chest cardiac pacemaker. Stable cardiomegaly and mediastinal contours. Calcified aortic atherosclerosis. Sequelae of CABG. Stable lung volumes. No pneumothorax. Increased pulmonary vascularity without overt edema. No consolidation. Difficult to exclude small pleural effusions. Negative visible bowel gas. Stable visualized osseous structures. IMPRESSION: 1. Chronic cardiomegaly with increased pulmonary vascularity from earlier this month but no overt edema. Difficult to exclude small pleural effusions. 2.  Aortic Atherosclerosis (ICD10-I70.0). Electronically Signed   By: Odessa Fleming M.D.   On: 01/16/2023 11:26    Pending Labs Unresulted Labs (From admission, onward)     Start     Ordered   01/17/23 0500  Basic metabolic panel  Tomorrow morning,   R        01/16/23 1648   01/17/23 0500  CBC  Tomorrow morning,   R        01/16/23 1648   01/17/23 0500  Magnesium  Tomorrow morning,   R        01/16/23 1648   01/17/23 0500  Phosphorus  Tomorrow morning,   R        01/16/23 1648   01/16/23 1019  Blood Culture (routine x 2)  (Undifferentiated presentation (screening labs and basic nursing orders))  BLOOD CULTURE X 2,   STAT      01/16/23 1019   01/16/23 1019  Urinalysis, w/ Reflex to Culture (Infection Suspected) -Urine, Catheterized   (Undifferentiated presentation (screening labs and basic nursing orders))  ONCE - URGENT,   URGENT       Question:  Specimen Source  Answer:  Urine, Catheterized   01/16/23 1019            Vitals/Pain Today's Vitals   01/16/23 1423 01/16/23 1423 01/16/23 1424 01/16/23 1515  BP:   (!) 138/51 (!) 133/53  Pulse:   66 60  Resp:   14 20  Temp: 98.4 F (36.9 C)     TempSrc: Oral     SpO2:   99% 96%  Weight:      Height:      PainSc:  0-No pain      Isolation Precautions No active isolations  Medications Medications  traMADol (ULTRAM) tablet 50 mg (has no administration in time range)  carvedilol (COREG) tablet 3.125 mg (has no administration in time range)  sacubitril-valsartan (ENTRESTO) 49-51 mg per tablet (has no administration in time range)  spironolactone (ALDACTONE) tablet 12.5 mg (has no administration in time range)  levothyroxine (SYNTHROID) tablet 50 mcg (has no administration in time range)  apixaban (ELIQUIS) tablet 2.5 mg (has no administration in time range)  0.9 %  sodium chloride infusion (  has no administration in time range)  acetaminophen (TYLENOL) tablet 650 mg (has no administration in time range)    Or  acetaminophen (TYLENOL) suppository 650 mg (has no administration in time range)  oxyCODONE (Oxy IR/ROXICODONE) immediate release tablet 5 mg (has no administration in time range)  docusate sodium (COLACE) capsule 100 mg (has no administration in time range)  senna (SENOKOT) tablet 8.6 mg (has no administration in time range)  ondansetron (ZOFRAN) tablet 4 mg (has no administration in time range)    Or  ondansetron (ZOFRAN) injection 4 mg (has no administration in time range)  ondansetron (ZOFRAN) injection 4 mg (4 mg Intravenous Given 01/16/23 1104)  HYDROcodone-acetaminophen (NORCO/VICODIN) 5-325 MG per tablet 1 tablet (1 tablet Oral Given 01/16/23 1303)    Mobility walks with device     Focused Assessments Neuro Assessment Handoff:       Last date known well: 01/15/23 Last time known well: 2000 Neuro Assessment:   Neuro Checks:      Has TPA been given? No If patient is a Neuro Trauma and patient is going to OR before floor call report to 4N Charge nurse: [phone] or [phone]   R Recommendations: See Admitting Provider Note  Report given to:   Additional Notes:  Pt currently oriented to person, place, time. Disoriented to situation.

## 2023-01-16 NOTE — H&P (Signed)
History and Physical    Stephanie Atkinson WCB:[id number] DOB: 04-Mar-1924 DOA: 01/16/2023  PCP: Linus Galas, NP   Patient coming from: Home  I have personally briefly reviewed patient's old medical records in [facility]  Chief Complaint: Altered mental status.  HPI: Stephanie Atkinson is a 89 y.o. female with PMH significant for coronary artery disease status post CABG, hypertension, hyperlipidemia, hypothyroidism, atrial fibrillation on Eliquis, symptomatic bradycardia status post PPM, chronic systolic heart failure on Entresto, spironolactone and Lasix as needed presented in the ED with confusion.  History is provided by the daughter who reports patient lives alone and she is functionally independent, has been having intermittent dizziness for some time.  She was seen well last night around 9 PM and in the morning when daughter went to her house she was found in the bed,  lethargic, She called EMS and patient was brought in the ED.  On arrival in the ED patient continued to remain confused, slowly has improved and now back to her baseline.  Patient denies any urinary symptoms, fall, nausea, vomiting, sick contacts.  ED Course: She is hemodynamically stable except hypertension. Temp 98.4, HR 60, RR 15, BP 1 99/88, SpO2 100% on room air Labs include sodium 139, potassium 3.7, chloride 108, bicarb 24, glucose 84, BUN 16, creatinine 1.10, calcium 11.0, anion gap 7, magnesium 2.5, alkaline phosphatase 59, albumin 3.4, AST 29, ALT 26, total protein 6.3, ammonia 18, total bilirubin 1.3, troponin 23> 24> 25, BNP 499.  WBC 3.4, hemoglobin 14.8, hematocrit 40.7, platelet 174, CT head no acute intracranial abnormality.  Chest x-ray shows chronic cardiomegaly but no pulmonary edema. EKG ventricular paced rhythm.  Review of Systems:   Review of Systems  Constitutional: Negative.   HENT: Negative.    Eyes: Negative.   Respiratory: Negative.    Cardiovascular: Negative.   Gastrointestinal: Negative.    Genitourinary: Negative.   Musculoskeletal:  Positive for back pain and joint pain.  Skin: Negative.   Neurological:  Positive for dizziness and weakness.  Endo/Heme/Allergies: Negative.   Psychiatric/Behavioral: Negative.       Past Medical History:  Diagnosis Date   Chronic kidney disease, stage 3 (HCC)    Coronary artery disease    Hx of CABG 2006   LIMA-LAD, free RIMA-PDA, Radial-OM1-OM2   Hyperlipidemia    Hypertension    Hypothyroid    MI, old        PAF (paroxysmal atrial fibrillation) (HCC)    was on amio, d/c'd due to prolonged PR interval, rate control   Presence of permanent cardiac pacemaker 07/16/2017   Presence of stent in right coronary artery 07/12   RIMA-PDA occluded, DES stent placed   Syncope 07/12   bradycardic, beta blocker decreased, also felt to be dehydrated    Past Surgical History:  Procedure Laterality Date   CARDIAC CATHETERIZATION  101512   dr. Onalee Hua harding, revealing a atretic bypass to her right with patent distal RCA stent   CAROTID STENT  2012   CORONARY ARTERY BYPASS GRAFT  2006   LIMA-LAD, free RIMA-PDA, Radial-OM1-OM2   DOPPLER ECHOCARDIOGRAPHY  616073   mild asymmetric left ventricular hypertrophy, left ventricular systolic function is low normal, ejection fraction = 50-55%, the LA is moderate dilated, the RA is mildly dilated, no significant valvular disease   INSERT / REPLACE / REMOVE PACEMAKER  07/16/2017   JOINT REPLACEMENT     hip replacement   LEFT HEART CATHETERIZATION WITH CORONARY/GRAFT ANGIOGRAM N/A 10/12/2012   Procedure:  LEFT HEART CATHETERIZATION WITH Isabel Caprice;  Surgeon: Marykay Lex, MD;  Location: [facility] CATH LAB;  Service: Cardiovascular;  Laterality: N/A;   LOOP RECORDER IMPLANT N/A 10/15/2012   Procedure: LOOP RECORDER IMPLANT;  Surgeon: Thurmon Fair, MD;  Location: [facility] CATH LAB;  Service: Cardiovascular;  Laterality: N/A;   LOOP RECORDER REMOVAL  07/16/2017   LOOP RECORDER REMOVAL N/A 07/16/2017    Procedure: LOOP RECORDER REMOVAL;  Surgeon: Thurmon Fair, MD;  Location: [facility] INVASIVE CV LAB;  Service: Cardiovascular;  Laterality: N/A;   NM MYOVIEW LTD  100510   post stress left ventricle is normal in size, post stress ejection fraction is 67% global left ventricular systolic function is normal, normal myocardial perfusion study, abnormal myocardial perfusion study, low risk scan   PACEMAKER IMPLANT N/A 07/16/2017   Procedure: PACEMAKER IMPLANT;  Surgeon: Thurmon Fair, MD;  Location: [facility] INVASIVE CV LAB;  Service: Cardiovascular;  Laterality: N/A;   THYROIDECTOMY       reports that she has never smoked. Her smokeless tobacco use includes snuff. She reports that she does not drink alcohol and does not use drugs.  Allergies  Allergen Reactions   Penicillins Hives   Sulfa Antibiotics Hives and Swelling   Gabapentin Other (See Comments)    Confusion   Lyrica [Pregabalin] Other (See Comments)    Confusion    Family History  Problem Relation Age of Onset   CAD Mother    CAD Maternal Grandmother    Family history reviewed and not pertinent.  Prior to Admission medications   Medication Sig Start Date End Date Taking? Authorizing Provider  acetaminophen (TYLENOL) 500 MG tablet Take 1 tablet (500 mg total) by mouth every 6 (six) hours as needed for moderate pain. Patient taking differently: Take 1,000 mg by mouth 3 (three) times daily. 06/27/19  Yes Briant Cedar, MD  atorvastatin (LIPITOR) 80 MG tablet Take 80 mg by mouth at bedtime.    Yes [provider]  carvedilol (COREG) 3.125 MG tablet Take 1 tablet (3.125 mg total) by mouth 2 (two) times daily. 04/10/22  Yes Runell Gess, MD  ELIQUIS 2.5 MG TABS tablet TAKE 1 TABLET BY MOUTH TWICE A DAY 09/26/22  Yes Runell Gess, MD  furosemide (LASIX) 20 MG tablet Take 1 tablet (20 mg total) by mouth daily. Take two tablets in case of weight gain 2 to 3 lbs in 24 hrs or 5 lbs in 7 days, until weight back to baseline.  01/15/23 04/15/23 Yes Azalee Course, PA  levothyroxine (SYNTHROID, LEVOTHROID) 50 MCG tablet Take 50 mcg by mouth daily before breakfast.    Yes [provider]  Polyethyl Glycol-Propyl Glycol (SYSTANE OP) Place 1 drop into both eyes daily as needed (dry eye).   Yes [provider]  sacubitril-valsartan (ENTRESTO) 49-51 MG Take 1 tablet by mouth 2 (two) times daily. 02/27/22  Yes Croitoru, Mihai, MD  spironolactone (ALDACTONE) 25 MG tablet Take 0.5 tablets (12.5 mg total) by mouth daily. 01/11/23 02/10/23 Yes Arrien, York Ram, MD  traMADol (ULTRAM) 50 MG tablet Take 1 tablet (50 mg total) by mouth 2 (two) times daily. Patient taking differently: Take 50 mg by mouth 3 (three) times daily. 10/15/22  Yes Tarry Kos, MD  VITAMIN D3 50 MCG (2000 UT) capsule Take 1 capsule by mouth once a week. 02/03/22  Yes [provider]    Physical Exam: Vitals:   01/16/23 1115 01/16/23 1423 01/16/23 1424 01/16/23 1515  BP: (!) 164/67  [name])  138/51 (!) 133/53  Pulse: (!) 59  66 60  Resp: 15  14 20   Temp:  98.4 F (36.9 C)    TempSrc:  Oral    SpO2: 100%  99% 96%  Weight:      Height:        Constitutional: NAD, calm, comfortable, deconditioned, not in any acute distress Vitals:   01/16/23 1115 01/16/23 1423 01/16/23 1424 01/16/23 1515  BP: (!) 164/67  (!) 138/51 (!) 133/53  Pulse: (!) 59  66 60  Resp: 15  14 20   Temp:  98.4 F (36.9 C)    TempSrc:  Oral    SpO2: 100%  99% 96%  Weight:      Height:       Eyes: PERRL, lids and conjunctivae normal ENMT: Mucous membranes are moist. Posterior pharynx clear of any exudate or lesions. Neck: normal, supple, no masses, no thyromegaly Respiratory: clear to auscultation bilaterally, no wheezing, no crackles. Normal respiratory effort. No accessory muscle use.  Cardiovascular: S1-S2 heard, regular rate and rhythm, no murmurs / rubs / gallops. No extremity edema.  PPM noted Abdomen: Soft, Non tender, Non distended , no masses  palpated. No hepatosplenomegaly. Bowel sounds positive.  Musculoskeletal: no clubbing / cyanosis. No joint deformity upper and lower extremities. Good ROM, no contractures. Normal muscle tone.  Skin: no rashes, lesions, ulcers. No induration Neurologic: CN 2-12 grossly intact. Sensation intact, DTR normal. Strength 5/5 in all 4.  Psychiatric: Normal judgment and insight. Alert and oriented x 3. Normal mood.    Labs on Admission: I have personally reviewed following labs and imaging studies  CBC: Recent Labs  Lab 01/16/23 1019  WBC 3.4*  NEUTROABS 2.0  HGB 14.8  HCT 40.7  MCV 97.4  PLT 174   Basic Metabolic Panel: Recent Labs  Lab 01/10/23 0332 01/16/23 1200  NA 141 139  K 4.6 3.7  CL 110 108  CO2 25 24  GLUCOSE 101* 84  BUN 14 16  CREATININE 1.09* 1.10*  CALCIUM 10.9* 11.0*  MG 2.7* 2.5*   GFR: Estimated Creatinine Clearance: 23.7 mL/min (A) (by C-G formula based on SCr of 1.1 mg/dL (H)). Liver Function Tests: Recent Labs  Lab 01/16/23 1200  AST 29  ALT 26  ALKPHOS 59  BILITOT 1.3*  PROT 6.3*  ALBUMIN 3.4*   No results for input(s): "LIPASE", "AMYLASE" in the last 168 hours. Recent Labs  Lab 01/16/23 1039  AMMONIA 18   Coagulation Profile: Recent Labs  Lab 01/16/23 1230  INR 1.2   Cardiac Enzymes: No results for input(s): "CKTOTAL", "CKMB", "CKMBINDEX", "TROPONINI" in the last 168 hours. BNP (last 3 results) No results for input(s): "PROBNP" in the last 8760 hours. HbA1C: No results for input(s): "HGBA1C" in the last 72 hours. CBG: No results for input(s): "GLUCAP" in the last 168 hours. Lipid Profile: No results for input(s): "CHOL", "HDL", "LDLCALC", "TRIG", "CHOLHDL", "LDLDIRECT" in the last 72 hours. Thyroid Function Tests: No results for input(s): "TSH", "T4TOTAL", "FREET4", "T3FREE", "THYROIDAB" in the last 72 hours. Anemia Panel: No results for input(s): "VITAMINB12", "FOLATE", "FERRITIN", "TIBC", "IRON", "RETICCTPCT" in the last 72  hours. Urine analysis:    Component Value Date/Time   COLORURINE STRAW (A) 01/08/2023 1035   APPEARANCEUR CLEAR 01/08/2023 1035   LABSPEC 1.006 01/08/2023 1035   PHURINE 6.0 01/08/2023 1035   GLUCOSEU >=500 (A) 01/08/2023 1035   HGBUR NEGATIVE 01/08/2023 1035   BILIRUBINUR NEGATIVE 01/08/2023 1035   KETONESUR NEGATIVE 01/08/2023 1035   PROTEINUR  NEGATIVE 01/08/2023 1035   UROBILINOGEN 1.0 10/14/2012 0922   NITRITE NEGATIVE 01/08/2023 1035   LEUKOCYTESUR NEGATIVE 01/08/2023 1035    Radiological Exams on Admission: CT Head Wo Contrast  Result Date: 01/16/2023 CLINICAL DATA:  Mental status change, unknown cause EXAM: CT HEAD WITHOUT CONTRAST TECHNIQUE: Contiguous axial images were obtained from the base of the skull through the vertex without intravenous contrast. RADIATION DOSE REDUCTION: This exam was performed according to the departmental dose-optimization program which includes automated exposure control, adjustment of the mA and/or kV according to patient size and/or use of iterative reconstruction technique. COMPARISON:  Head CT 01/08/2023 FINDINGS: Brain: No hemorrhage. No hydrocephalus. No extra-axial fluid collection. No CT evidence of an acute cortical infarct. No mass effect. No mass lesion. There are chronic infarcts in the left thalamus, left basal ganglia, and superior right cerebellum Vascular: No hyperdense vessel or unexpected calcification. Skull: Normal. Negative for fracture or focal lesion. Sinuses/Orbits: No middle ear or mastoid effusion. Paranasal sinuses are notable for mild mucosal thickening in the right maxillary sinus. Bilateral lens replacement. Orbits are otherwise unremarkable. Other: None. IMPRESSION: No acute intracranial abnormality Electronically Signed   By: Lorenza Cambridge M.D.   On: 01/16/2023 12:08   DG Chest Port 1 View  Result Date: 01/16/2023 CLINICAL DATA:  89 year old female with possible sepsis. EXAM: PORTABLE CHEST 1 VIEW COMPARISON:  Portable  chest 01/08/2023 and earlier. FINDINGS: Portable AP semi upright view at 1031 hours. Chronic left chest cardiac pacemaker. Stable cardiomegaly and mediastinal contours. Calcified aortic atherosclerosis. Sequelae of CABG. Stable lung volumes. No pneumothorax. Increased pulmonary vascularity without overt edema. No consolidation. Difficult to exclude small pleural effusions. Negative visible bowel gas. Stable visualized osseous structures. IMPRESSION: 1. Chronic cardiomegaly with increased pulmonary vascularity from earlier this month but no overt edema. Difficult to exclude small pleural effusions. 2.  Aortic Atherosclerosis (ICD10-I70.0). Electronically Signed   By: Odessa Fleming M.D.   On: 01/16/2023 11:26    EKG: Independently reviewed.  Ventricular paced rhythm  Assessment/Plan Principal Problem:   Acute metabolic encephalopathy Active Problems:   Symptomatic bradycardia s/p PPM    Chronic kidney disease, stage 3b (HCC)   Hypercalcemia   Permanent atrial fibrillation (HCC)   Hypertension   Hypothyroidism   Hx of CABG X 4 3/06-    Hypercholesterolemia   Chronic diastolic heart failure (HCC)  Acute metabolic encephalopathy: Patient was found lethargic in the bed in the morning. This could be multifactorial, could be UTI or hypercalcemia. CT head > No acute intracranial abnormality.  There is no focal neurological deficit. Less likely CVA. Chest x-ray shows chronic cardiomegaly but no infiltrate. There is no signs concerning for infection. Follow-up UA and decide about antibiotics. She is back to her baseline mental status.  Hypercalcemia: Her calcium is 11.0,  has been running high for last few times. Continue gentle IV hydration for 12 hours. Recheck calcium level.  Chronic systolic CHF: Last echocardiogram shows LVEF 35 to 40%. BNP 499.1  Continue Entresto, spironolactone, Lasix. Does not appear in acute exacerbation.  CKD stage III: Serum creatinine at baseline.  CAD status  post CABG: Continue Coreg, Lipitor, Eliquis  Hypothyroidism: Continue levothyroxine  Permanent atrial fibrillation: HR controlled. Continue Eliquis and Coreg.  Status post PPM: Stable, Follow-up outpatient  Chronic arthritis. Continue tramadol as needed for pain  Generalized weakness PT and OT evaluation.  DVT prophylaxis: Eliquis Code Status: Full code Family Communication: Daughter at bed side. Disposition Plan:     Status is: Inpatient Remains  inpatient appropriate because: Admitted for acute metabolic encephalopathy likely due to hypercalcemia.    Consults called: None Admission status: Inpatient   Willeen Niece MD [facility]   If 7PM-7AM, please contact night-coverage [email or web address]  01/16/2023, 5:11 PM

## 2023-01-16 NOTE — ED Notes (Signed)
[place]wick put on pt at this time.

## 2023-01-17 ENCOUNTER — Inpatient Hospital Stay (HOSPITAL_COMMUNITY): Payer: Medicare PPO

## 2023-01-17 DIAGNOSIS — M549 Dorsalgia, unspecified: Secondary | ICD-10-CM | POA: Diagnosis not present

## 2023-01-17 DIAGNOSIS — M5135 Other intervertebral disc degeneration, thoracolumbar region: Secondary | ICD-10-CM | POA: Diagnosis not present

## 2023-01-17 DIAGNOSIS — M1612 Unilateral primary osteoarthritis, left hip: Secondary | ICD-10-CM | POA: Diagnosis not present

## 2023-01-17 DIAGNOSIS — Z471 Aftercare following joint replacement surgery: Secondary | ICD-10-CM | POA: Diagnosis not present

## 2023-01-17 DIAGNOSIS — G9341 Metabolic encephalopathy: Secondary | ICD-10-CM | POA: Diagnosis not present

## 2023-01-17 DIAGNOSIS — I700 Atherosclerosis of aorta: Secondary | ICD-10-CM | POA: Diagnosis not present

## 2023-01-17 DIAGNOSIS — Z96641 Presence of right artificial hip joint: Secondary | ICD-10-CM | POA: Diagnosis not present

## 2023-01-17 LAB — URINALYSIS, W/ REFLEX TO CULTURE (INFECTION SUSPECTED)
Bilirubin Urine: NEGATIVE
Glucose, UA: NEGATIVE mg/dL
Hgb urine dipstick: NEGATIVE
Ketones, ur: NEGATIVE mg/dL
Nitrite: NEGATIVE
Protein, ur: NEGATIVE mg/dL
Specific Gravity, Urine: 1.009 (ref 1.005–1.030)
pH: 6 (ref 5.0–8.0)

## 2023-01-17 LAB — CBC
HCT: 44.4 % (ref 36.0–46.0)
Hemoglobin: 14.1 g/dL (ref 12.0–15.0)
MCH: 29.7 pg (ref 26.0–34.0)
MCHC: 31.8 g/dL (ref 30.0–36.0)
MCV: 93.5 fL (ref 80.0–100.0)
Platelets: 185 10*3/uL (ref 150–400)
RBC: 4.75 MIL/uL (ref 3.87–5.11)
RDW: 14.4 % (ref 11.5–15.5)
WBC: 3.4 10*3/uL — ABNORMAL LOW (ref 4.0–10.5)
nRBC: 0 % (ref 0.0–0.2)

## 2023-01-17 LAB — BASIC METABOLIC PANEL
Anion gap: 3 — ABNORMAL LOW (ref 5–15)
BUN: 13 mg/dL (ref 8–23)
CO2: 27 mmol/L (ref 22–32)
Calcium: 10.7 mg/dL — ABNORMAL HIGH (ref 8.9–10.3)
Chloride: 109 mmol/L (ref 98–111)
Creatinine, Ser: 1.08 mg/dL — ABNORMAL HIGH (ref 0.44–1.00)
GFR, Estimated: 47 mL/min — ABNORMAL LOW (ref >60)
Glucose, Bld: 97 mg/dL (ref 70–99)
Potassium: 4.1 mmol/L (ref 3.5–5.1)
Sodium: 139 mmol/L (ref 135–145)

## 2023-01-17 LAB — PHOSPHORUS: Phosphorus: 2.9 mg/dL (ref 2.5–4.6)

## 2023-01-17 LAB — MAGNESIUM: Magnesium: 2.5 mg/dL — ABNORMAL HIGH (ref 1.7–2.4)

## 2023-01-17 NOTE — Care Management CC44 (Addendum)
Condition Code 44 Documentation Completed  Patient Details  Name: RENN SITLER MRN: [id number] Date of Birth: May 19, 1924   Condition Code 44 given:  Yes Patient signature on Condition Code 44 notice:  Yes Documentation of 2 MD's agreement:  Yes Code 44 added to claim:  Yes   Spoke with patient via phone, she wanted her daughter to receive the notice Janace Aris, spoke to Ms. Rashid and gave notice she agrees permission that she received oral notice. Copy of notice placed in patients room Lockie Pares, RN 01/17/2023, 4:46 PM

## 2023-01-17 NOTE — Progress Notes (Signed)
Mobility Specialist Progress Note:   01/17/23 1500  Mobility  Activity Ambulated with assistance in hallway  Level of Assistance Contact guard assist, steadying assist  Assistive Device Four wheel walker  Distance Ambulated (ft) 240 ft  Activity Response Tolerated well  Mobility Referral Yes  $Mobility charge 1 Mobility  Mobility Specialist Start Time (ACUTE ONLY) 1446  Mobility Specialist Stop Time (ACUTE ONLY) 1515  Mobility Specialist Time Calculation (min) (ACUTE ONLY) 29 min    Pt received on EOB, eager to mobility. Asymptomatic w/ no complaints throughout. Returned to room w/o fault. Pt left on EOB with call bell and all needs met. Bed alarm on.  D'Vante Earlene Plater Mobility Specialist Please contact via Special educational needs teacher or Rehab office at [phone]

## 2023-01-17 NOTE — Progress Notes (Signed)
PROGRESS NOTE    LETITIA SNUFFER  OVF:[id number] DOB: [birth year] DOA: 01/16/2023 PCP: Linus Galas, NP   Brief Narrative:  This 89 y.o. female with PMH significant for coronary artery disease status post CABG, hypertension, hyperlipidemia, hypothyroidism, atrial fibrillation on Eliquis, symptomatic bradycardia status post PPM, chronic systolic heart failure on Entresto, spironolactone and Lasix as needed presented in the ED with confusion.  She is functionally independent, has been having intermittent dizziness for some time.  She was seen well in the night around 9 PM and in the morning when daughter went to her house she was found in the bed,  lethargic, She called EMS and patient was brought in the ED.  On arrival in the ED patient continued to remain confused, slowly has improved and now back to her baseline.  Workup consistent with hypercalcemia which has now improving.  PT and OT recommended home health PT.  Patient continued to complain about lower back pain,  during workup.   Assessment & Plan:   Principal Problem:   Acute metabolic encephalopathy Active Problems:   Symptomatic bradycardia s/p PPM    Chronic kidney disease, stage 3b (HCC)   Hypercalcemia   Permanent atrial fibrillation (HCC)   Hypertension   Hypothyroidism   Hx of CABG X 4 3/06-    Hypercholesterolemia   Chronic diastolic heart failure (HCC)  Acute metabolic encephalopathy: Patient was found lethargic in the bed in the morning. This could be multifactorial, could be UTI or hypercalcemia. CT head > No acute intracranial abnormality.  There is no focal neurological deficit. Less likely CVA. Chest x-ray shows chronic cardiomegaly but no infiltrate. There is no signs concerning for infection. UA shows small leukocytes but patient denies any urinary symptoms. She is back to her baseline mental status.   Hypercalcemia: Her calcium is 11.0,  has been running high for last few times. Continue gentle IV hydration  for 12 hours. Recheck calcium improving. 10.7  Lower Back pain: Patient complains of worsening lower back pain. X-ray lumbar spine and pelvis ordered. Continue hydrocodone as needed for pain   Chronic systolic CHF: Last echocardiogram shows LVEF 35 to 40%. BNP 499.1  Continue Entresto, spironolactone, Lasix. Does not appear in acute exacerbation.   CKD stage III: Serum creatinine at baseline.   CAD status post CABG: Continue Coreg, Lipitor, Eliquis   Hypothyroidism: Continue levothyroxine   Permanent atrial fibrillation: HR controlled. Continue Eliquis and Coreg.   Status post PPM: Stable, Follow-up outpatient   Chronic arthritis. Continue tramadol as needed for pain   Generalized weakness PT and OT evaluation.   DVT prophylaxis: Eliquis Code Status: Full code Family Communication: Daughter at bed side Disposition Plan:    Status is: Inpatient Remains inpatient appropriate because: Admitted for altered mental status which is now improved.   Patient continued to complain about worsening lower back pain.  X-ray imaging is ordered.    Consultants:  None  Procedures: None  Antimicrobials: Anti-infectives (From admission, onward)    None      Subjective: Patient was seen and examined at bedside.  Overnight events noted.   Patient continued to complain about worsening lower back pain.  X-rays ordered.  Objective: Vitals:   01/17/23 0435 01/17/23 0752 01/17/23 0900 01/17/23 1202  BP: (!) 140/57 (!) 156/75 133/85 134/60  Pulse: 60  (!) 59 61  Resp: 17 18  18   Temp: 97.6 F (36.4 C) 97.6 F (36.4 C)  97.9 F (36.6 C)  TempSrc: Oral Oral  Oral  SpO2: 97%  100% 98%  Weight:      Height:        Intake/Output Summary (Last 24 hours) at 01/17/2023 1623 Last data filed at 01/17/2023 1045 Gross per 24 hour  Intake 788.72 ml  Output 350 ml  Net 438.72 ml   Filed Weights   01/16/23 1015  Weight: 60 kg    Examination:  General exam: Appears  calm and comfortable, deconditioned, not in any acute distress. Respiratory system: Clear to auscultation. Respiratory effort normal. RR 13 Cardiovascular system: S1 & S2 heard, RRR. No JVD, murmurs, rubs, gallops or clicks. No pedal edema. Gastrointestinal system: Abdomen is nondistended, soft and nontender.  Normal bowel sounds heard. Central nervous system: Alert and oriented X 3. No focal neurological deficits. Back : Lumbar spine tenderness noted, restricted movements Extremities: No edema, no cyanosis, no clubbing Skin: No rashes, lesions or ulcers Psychiatry: Judgement and insight appear normal. Mood & affect appropriate.     Data Reviewed: I have personally reviewed following labs and imaging studies  CBC: Recent Labs  Lab 01/16/23 1019 01/17/23 0540  WBC 3.4* 3.4*  NEUTROABS 2.0  --   HGB 14.8 14.1  HCT 40.7 44.4  MCV 97.4 93.5  PLT 174 185   Basic Metabolic Panel: Recent Labs  Lab 01/16/23 1200 01/17/23 0540  NA 139 139  K 3.7 4.1  CL 108 109  CO2 24 27  GLUCOSE 84 97  BUN 16 13  CREATININE 1.10* 1.08*  CALCIUM 11.0* 10.7*  MG 2.5* 2.5*  PHOS  --  2.9   GFR: Estimated Creatinine Clearance: 24.1 mL/min (A) (by C-G formula based on SCr of 1.08 mg/dL (H)). Liver Function Tests: Recent Labs  Lab 01/16/23 1200  AST 29  ALT 26  ALKPHOS 59  BILITOT 1.3*  PROT 6.3*  ALBUMIN 3.4*   No results for input(s): "LIPASE", "AMYLASE" in the last 168 hours. Recent Labs  Lab 01/16/23 1039  AMMONIA 18   Coagulation Profile: Recent Labs  Lab 01/16/23 1230  INR 1.2   Cardiac Enzymes: No results for input(s): "CKTOTAL", "CKMB", "CKMBINDEX", "TROPONINI" in the last 168 hours. BNP (last 3 results) No results for input(s): "PROBNP" in the last 8760 hours. HbA1C: No results for input(s): "HGBA1C" in the last 72 hours. CBG: No results for input(s): "GLUCAP" in the last 168 hours. Lipid Profile: No results for input(s): "CHOL", "HDL", "LDLCALC", "TRIG",  "CHOLHDL", "LDLDIRECT" in the last 72 hours. Thyroid Function Tests: No results for input(s): "TSH", "T4TOTAL", "FREET4", "T3FREE", "THYROIDAB" in the last 72 hours. Anemia Panel: No results for input(s): "VITAMINB12", "FOLATE", "FERRITIN", "TIBC", "IRON", "RETICCTPCT" in the last 72 hours. Sepsis Labs: Recent Labs  Lab 01/16/23 1055 01/16/23 1220  LATICACIDVEN 1.7 1.8    Recent Results (from the past 240 hour(s))  Blood Culture (routine x 2)     Status: None (Preliminary result)   Collection Time: 01/16/23 10:19 AM   Specimen: BLOOD RIGHT ARM  Result Value Ref Range Status   Specimen Description BLOOD RIGHT ARM  Final   Special Requests   Final    BOTTLES DRAWN AEROBIC AND ANAEROBIC Blood Culture results may not be optimal due to an inadequate volume of blood received in culture bottles   Culture   Final    NO GROWTH < 24 HOURS Performed at [facility], [address] [place], [place] [address]    Report Status PENDING  Incomplete  Blood Culture (routine x 2)     Status:  None (Preliminary result)   Collection Time: 01/16/23 10:24 AM   Specimen: BLOOD LEFT ARM  Result Value Ref Range Status   Specimen Description BLOOD LEFT ARM  Final   Special Requests   Final    BOTTLES DRAWN AEROBIC AND ANAEROBIC Blood Culture adequate volume   Culture   Final    NO GROWTH < 24 HOURS Performed at [facility], [address], [place], [place] [address]    Report Status PENDING  Incomplete    Radiology Studies: CT Head Wo Contrast  Result Date: 01/16/2023 CLINICAL DATA:  Mental status change, unknown cause EXAM: CT HEAD WITHOUT CONTRAST TECHNIQUE: Contiguous axial images were obtained from the base of the skull through the vertex without intravenous contrast. RADIATION DOSE REDUCTION: This exam was performed according to the departmental dose-optimization program which includes automated exposure control, adjustment of the mA and/or kV according to patient size and/or use of  iterative reconstruction technique. COMPARISON:  Head CT 01/08/2023 FINDINGS: Brain: No hemorrhage. No hydrocephalus. No extra-axial fluid collection. No CT evidence of an acute cortical infarct. No mass effect. No mass lesion. There are chronic infarcts in the left thalamus, left basal ganglia, and superior right cerebellum Vascular: No hyperdense vessel or unexpected calcification. Skull: Normal. Negative for fracture or focal lesion. Sinuses/Orbits: No middle ear or mastoid effusion. Paranasal sinuses are notable for mild mucosal thickening in the right maxillary sinus. Bilateral lens replacement. Orbits are otherwise unremarkable. Other: None. IMPRESSION: No acute intracranial abnormality Electronically Signed   By: Lorenza Cambridge M.D.   On: 01/16/2023 12:08   DG Chest Port 1 View  Result Date: 01/16/2023 CLINICAL DATA:  89 year old female with possible sepsis. EXAM: PORTABLE CHEST 1 VIEW COMPARISON:  Portable chest 01/08/2023 and earlier. FINDINGS: Portable AP semi upright view at 1031 hours. Chronic left chest cardiac pacemaker. Stable cardiomegaly and mediastinal contours. Calcified aortic atherosclerosis. Sequelae of CABG. Stable lung volumes. No pneumothorax. Increased pulmonary vascularity without overt edema. No consolidation. Difficult to exclude small pleural effusions. Negative visible bowel gas. Stable visualized osseous structures. IMPRESSION: 1. Chronic cardiomegaly with increased pulmonary vascularity from earlier this month but no overt edema. Difficult to exclude small pleural effusions. 2.  Aortic Atherosclerosis (ICD10-I70.0). Electronically Signed   By: Odessa Fleming M.D.   On: 01/16/2023 11:26    Scheduled Meds:  apixaban  2.5 mg Oral BID   carvedilol  3.125 mg Oral BID   docusate sodium  100 mg Oral BID   levothyroxine  50 mcg Oral Q0600   sacubitril-valsartan  1 tablet Oral BID   senna  1 tablet Oral BID   spironolactone  12.5 mg Oral Daily   traMADol  50 mg Oral BID   Continuous  Infusions:   LOS: 1 day    Time spent: 35 mins    Willeen Niece, MD [facility]   If 7PM-7AM, please contact night-coverage

## 2023-01-17 NOTE — Care Management Obs Status (Signed)
MEDICARE OBSERVATION STATUS NOTIFICATION   Patient Details  Name: ANALISE DOMIN MRN: [id number] Date of Birth: [birth year]   Medicare Observation Status Notification Given:  Yes    Lockie Pares, RN 01/17/2023, 4:46 PM

## 2023-01-17 NOTE — Evaluation (Signed)
Physical Therapy Evaluation Patient Details Name: Stephanie Atkinson MRN: [id number] DOB: 23-Nov-1924 Today's Date: 01/17/2023  History of Present Illness  Pt is a 89 y.o. female who presented 01/16/23 with AMS, vomiting and lethargy. Admitted for acute metabolic encephalopathy. PMH: HTN, HLD, CAD s/p CABG, paroxysmal atrial fibrillation, hypothyroidism, CKD stage 3, MI, syncope   Clinical Impression  Pt presents with condition above and deficits mentioned below, see PT Problem List. Pt is familiar to this PT from her most recent admission. Currently, pt appears cognitively at her baseline. PTA, pt was mod I utilizing a rollator for functional mobility, living alone in her 2nd floor apartment at an [facility]. There is elevator access. Pt reports she does continue to get dizzy intermittently, but denies it being like the room is spinning. She describes it as having blurry vision, falling forward, and about to pass when she gets up quickly. Educated pt on changing positions slowly and performing AROM of extremities as needed to try to manage her intermittent symptoms. She verbalized understanding. Pt denied dizziness this session. BP stable throughout, see General Comments below. Currently, pt is limited by sciatic pain and demonstrates deficits in activity tolerance and balance. She needed minA to transition supine to sit but otherwise only needed CGA-supervision for transfers and gait bouts with a rollator this date. Will continue to follow acutely. Recommending HHPT upon d/c.       If plan is discharge home, recommend the following: Assistance with cooking/housework;Assist for transportation   Can travel by private vehicle        Equipment Recommendations None recommended by PT  Recommendations for Other Services       Functional Status Assessment Patient has had a recent decline in their functional status and demonstrates the ability to make significant improvements in function in a reasonable and  predictable amount of time.     Precautions / Restrictions Precautions Precautions: Fall Restrictions Weight Bearing Restrictions: No      Mobility  Bed Mobility Overal bed mobility: Needs Assistance Bed Mobility: Supine to Sit, Sit to Supine     Supine to sit: Min assist, HOB elevated, Used rails Sit to supine: Contact guard assist, HOB elevated, Used rails   General bed mobility comments: Pt requesting minA to lift her trunk to sit up R EOB. CGA for safety with return to supine. Increased time for all bed mobility aspects.    Transfers Overall transfer level: Needs assistance Equipment used: Rollator (4 wheels) Transfers: Sit to/from Stand Sit to Stand: Supervision           General transfer comment: Supervision for safety    Ambulation/Gait Ambulation/Gait assistance: Contact guard assist Gait Distance (Feet): 240 Feet Assistive device: Rollator (4 wheels) Gait Pattern/deviations: Step-through pattern, Decreased stride length, Trunk flexed, Antalgic Gait velocity: reduced Gait velocity interpretation: <1.8 ft/sec, indicate of risk for recurrent falls   General Gait Details: Pt ambulates slowly with a flexed posture, no LOB, CGA for safety. Mildly antalgic due to sciatic pain that increased with distance  Stairs            Wheelchair Mobility     Tilt Bed    Modified Rankin (Stroke Patients Only)       Balance Overall balance assessment: Needs assistance Sitting-balance support: No upper extremity supported, Feet supported Sitting balance-Leahy Scale: Good     Standing balance support: Bilateral upper extremity supported, During functional activity, Reliant on assistive device for balance Standing balance-Leahy Scale: Fair Standing balance comment: uses rollator to  ambulate                             Pertinent Vitals/Pain Pain Assessment Pain Assessment: Faces Faces Pain Scale: Hurts little more Pain Location: sciatic  pain Pain Descriptors / Indicators: Discomfort, Grimacing, Guarding, Sharp Pain Intervention(s): Limited activity within patient's tolerance, Monitored during session, Repositioned    Home Living Family/patient expects to be discharged to:: Other (Comment) (ILF) Living Arrangements: Alone Available Help at Discharge: Family;Available PRN/intermittently Type of Home: Other(Comment) (ILF) Home Access: Elevator       Home Layout: One level Home Equipment: Grab bars - tub/shower;Grab bars - toilet;Shower seat;Cane - single point;Rollator (4 wheels);Cane - Programmer, applications (2 wheels)      Prior Function Prior Level of Function : Independent/Modified Independent;Driving             Mobility Comments: uses rollator for mobility, no falls ADLs Comments: drives, does own ADLs (sponge bathing). if showering, pt will have daughter come by. does own household chores     Extremity/Trunk Assessment   Upper Extremity Assessment Upper Extremity Assessment: Defer to OT evaluation    Lower Extremity Assessment Lower Extremity Assessment: Overall WFL for tasks assessed    Cervical / Trunk Assessment Cervical / Trunk Assessment: Kyphotic  Communication   Communication Communication: No apparent difficulties  Cognition Arousal: Alert Behavior During Therapy: WFL for tasks assessed/performed Overall Cognitive Status: Within Functional Limits for tasks assessed                                          General Comments General comments (skin integrity, edema, etc.): pt denied dizziness this session, reports dizziness still occurs occasionally when she gets up quickly and describes it as blurry vision, falling forward, and about to pass out but no like the room is spinning; educated pt on changing positions slowly and performing AROM of extremities as needed to try to manage her intermittent symptoms; BP 133/85 supine, 149/74 sitting, 134/84 standing    Exercises      Assessment/Plan    PT Assessment Patient needs continued PT services  PT Problem List Decreased activity tolerance;Decreased balance;Decreased mobility       PT Treatment Interventions DME instruction;Gait training;Functional mobility training;Therapeutic exercise;Therapeutic activities;Balance training;Neuromuscular re-education;Patient/family education    PT Goals (Current goals can be found in the Care Plan section)  Acute Rehab PT Goals Patient Stated Goal: to return home PT Goal Formulation: With patient Time For Goal Achievement: 01/31/23 Potential to Achieve Goals: Good    Frequency Min 1X/week     Co-evaluation               AM-PAC PT "6 Clicks" Mobility  Outcome Measure Help needed turning from your back to your side while in a flat bed without using bedrails?: A Little Help needed moving from lying on your back to sitting on the side of a flat bed without using bedrails?: A Little Help needed moving to and from a bed to a chair (including a wheelchair)?: A Little Help needed standing up from a chair using your arms (e.g., wheelchair or bedside chair)?: A Little Help needed to walk in hospital room?: A Little Help needed climbing 3-5 steps with a railing? : A Little 6 Click Score: 18    End of Session Equipment Utilized During Treatment: Gait belt Activity Tolerance: Patient  tolerated treatment well Patient left: in bed;with call bell/phone within reach;with bed alarm set   PT Visit Diagnosis: Unsteadiness on feet (R26.81);Other abnormalities of gait and mobility (R26.89);Difficulty in walking, not elsewhere classified (R26.2);Dizziness and giddiness (R42)    Time: 6440-3474 PT Time Calculation (min) (ACUTE ONLY): 32 min   Charges:   PT Evaluation $PT Eval Low Complexity: 1 Low PT Treatments $Therapeutic Activity: 8-22 mins PT General Charges $$ ACUTE PT VISIT: 1 Visit         Virgil Benedict, PT, DPT Acute Rehabilitation Services  Office:  [phone]   Bettina Gavia 01/17/2023, 10:40 AM

## 2023-01-17 NOTE — Progress Notes (Addendum)
CSW received consult for patient. CSW spoke with patient at bedside. Patient reports she comes from [facility], home alone. Patient confirmed plan to return home with hh services. Patient interested in ALF resources to look into if ever decides to pursue in the future.CSW offered patient ALF resources and medicaid resources. Patient accepted. Patient reports her daughter will transport her home when medically ready. Patient gave CSW permission to update daughter [name]. CSW updated patients daughter [name]. All questions answered.Judy request for CM to call her in regards to [facility] needs for patient.  No further questions reported at this time.CSW informed CM. CM confirmed with CSW she will follow up with [name].

## 2023-01-17 NOTE — Evaluation (Signed)
Occupational Therapy Evaluation Patient Details Name: Stephanie Atkinson MRN: [id number] DOB: Jan 26, 1925 Today's Date: 01/17/2023   History of Present Illness Pt is a 89 y.o. female who presented 01/16/23 with AMS, vomiting and lethargy. Admitted for acute metabolic encephalopathy. PMH: HTN, HLD, CAD s/p CABG, paroxysmal atrial fibrillation, hypothyroidism, CKD stage 3, MI, syncope   Clinical Impression   PTA, pt lives at ILF and typically Modified Independent with ADLs, basic household IADLs, driving and mobility using a walker. Pt presents now fairly close to baseline with minor deficits in activity tolerance and dynamic standing balance. Pt requires approximately Supervision for bathroom mobility with RW and LB ADLs. Anticipate no OT needs at DC but will continue to follow acutely.       If plan is discharge home, recommend the following: Assistance with cooking/housework;Assist for transportation    Functional Status Assessment  Patient has had a recent decline in their functional status and demonstrates the ability to make significant improvements in function in a reasonable and predictable amount of time.  Equipment Recommendations  None recommended by OT    Recommendations for Other Services       Precautions / Restrictions Precautions Precautions: Fall Restrictions Weight Bearing Restrictions: No      Mobility Bed Mobility Overal bed mobility: Needs Assistance Bed Mobility: Supine to Sit, Sit to Supine     Supine to sit: Min assist, HOB elevated, Used rails Sit to supine: Contact guard assist   General bed mobility comments: Min A to lift trunk, increased time    Transfers Overall transfer level: Needs assistance Equipment used: Rolling walker (2 wheels) Transfers: Sit to/from Stand Sit to Stand: Supervision                  Balance Overall balance assessment: Needs assistance Sitting-balance support: No upper extremity supported, Feet supported Sitting  balance-Leahy Scale: Good     Standing balance support: Bilateral upper extremity supported, During functional activity, Reliant on assistive device for balance Standing balance-Leahy Scale: Fair                             ADL either performed or assessed with clinical judgement   ADL Overall ADL's : Needs assistance/impaired Eating/Feeding: Independent   Grooming: Supervision/safety;Standing   Upper Body Bathing: Supervision/ safety;Sitting   Lower Body Bathing: Supervison/ safety;Sitting/lateral leans;Sit to/from stand   Upper Body Dressing : Supervision/safety   Lower Body Dressing: Supervision/safety;Sit to/from stand;Sitting/lateral leans   Toilet Transfer: Supervision/safety;Ambulation;Rolling walker (2 wheels) Toilet Transfer Details (indicate cue type and reason): slow pace but overall functional Toileting- Clothing Manipulation and Hygiene: Supervision/safety;Sitting/lateral lean;Sit to/from stand Toileting - Clothing Manipulation Details (indicate cue type and reason): able to manage underwear, peri care without physical assist             Vision Ability to See in Adequate Light: 0 Adequate Patient Visual Report: No change from baseline Vision Assessment?: No apparent visual deficits     Perception         Praxis         Pertinent Vitals/Pain Pain Assessment Pain Assessment: No/denies pain     Extremity/Trunk Assessment Upper Extremity Assessment Upper Extremity Assessment: Overall WFL for tasks assessed;Right hand dominant   Lower Extremity Assessment Lower Extremity Assessment: Defer to PT evaluation   Cervical / Trunk Assessment Cervical / Trunk Assessment: Kyphotic   Communication Communication Communication: No apparent difficulties   Cognition Arousal: Alert Behavior During Therapy: [facility]  for tasks assessed/performed Overall Cognitive Status: Within Functional Limits for tasks assessed                                        General Comments  No recliner in pt room; discussed with NT and secretary - unable to locate available recliner    Exercises     Shoulder Instructions      Home Living Family/patient expects to be discharged to:: Other (Comment) (ILF) Living Arrangements: Alone Available Help at Discharge: Family;Available PRN/intermittently Type of Home: Other(Comment) (ILF) Home Access: Elevator     Home Layout: One level     Bathroom Shower/Tub: Producer, television/film/video: Handicapped height     Home Equipment: Grab bars - tub/shower;Grab bars - toilet;Shower seat;Cane - single point;Rollator (4 wheels);Cane - Programmer, applications (2 wheels)          Prior Functioning/Environment Prior Level of Function : Independent/Modified Independent;Driving             Mobility Comments: uses walker for mobility, no falls ADLs Comments: drives, does own ADLs (sponge bathing). if showering, pt will have daughter come by. does own household chores        OT Problem List: Decreased activity tolerance;Impaired balance (sitting and/or standing)      OT Treatment/Interventions: Self-care/ADL training;Therapeutic exercise;Energy conservation;DME and/or AE instruction;Therapeutic activities;Patient/family education    OT Goals(Current goals can be found in the care plan section) Acute Rehab OT Goals Patient Stated Goal: go home OT Goal Formulation: With patient Time For Goal Achievement: 01/31/23 Potential to Achieve Goals: Good ADL Goals Pt Will Perform Lower Body Dressing: with modified independence;sit to/from stand;sitting/lateral leans Pt Will Transfer to Toilet: with modified independence;ambulating Additional ADL Goal #1: Pt to increase standing activity tolerance > 10 min during functional tasks without seated rest break.  OT Frequency: Min 1X/week    Co-evaluation              AM-PAC OT "6 Clicks" Daily Activity     Outcome Measure Help from another  person eating meals?: None Help from another person taking care of personal grooming?: A Little Help from another person toileting, which includes using toliet, bedpan, or urinal?: A Little Help from another person bathing (including washing, rinsing, drying)?: A Little Help from another person to put on and taking off regular upper body clothing?: A Little Help from another person to put on and taking off regular lower body clothing?: A Little 6 Click Score: 19   End of Session Equipment Utilized During Treatment: Gait belt;Rolling walker (2 wheels) Nurse Communication: Mobility status  Activity Tolerance: Patient tolerated treatment well Patient left: in bed;with call bell/phone within reach;with bed alarm set;Other (comment) (no recliner in room)  OT Visit Diagnosis: Other abnormalities of gait and mobility (R26.89)                Time: 0981-1914 OT Time Calculation (min): 31 min Charges:  OT General Charges $OT Visit: 1 Visit OT Evaluation $OT Eval Low Complexity: 1 Low OT Treatments $Self Care/Home Management : 8-22 mins  Bradd Canary, OTR/L Acute Rehab Services Office: [phone]   Lorre Munroe 01/17/2023, 8:35 AM

## 2023-01-17 NOTE — Care Management (Addendum)
Transition of Care [facility]) - Inpatient Brief Assessment   Patient Details  Name: Stephanie Atkinson MRN: [id number] Date of Birth: 09-05-1924  [facility] CM/SW Contact:    Lockie Pares, RN Phone Number: 01/17/2023, 12:13 PM   Clinical Narrative:  Patient presented from [facility], with lethargy, encephalopathy. She was just discharged from hospital on 11/15. She has home health RN, PT , OT with [facility].  She has a walker. 1225 MD messaged to state daughter [name]  is interested in SNF near [place] where she lives, had attempted to call patient, mailbox full. Added social work to message to follow up with family/ patient 1350 [name] CSW spoke with patient and daughter regarding assisted living and qualifications for SNF, Daughter asked for a call about Home health. Messaged [name] from [facility] to verify she is active with them from previous admission. Will call daughter [name] once this is verified 1415 verification that the patient is active with [facility], Called Daughter [name], could not leave VM, mailbox full. Will call to speak with her again as time allows. 1600 Called daughter [name], she had left a message. She is aware that [facility] will contact them to re set up services. [name] from [facility] aware that the patient will likely discharge tomorrow.  Daughter sates no further DME or needs.   No further needs identified a this time. TOC will follow  Transition of Care Asessment: Insurance and Status: Insurance coverage has been reviewed Patient has primary care physician: Yes Home environment has been reviewed: Independent Living Prior level of function:: [place] Prior/Current Home Services: Current home services [facility] pT OT RN [phone]) Social Determinants of Health Reivew: SDOH reviewed no interventions necessary Readmission risk has been reviewed: Yes Transition of care needs: transition of care needs identified, TOC will continue to follow

## 2023-01-18 DIAGNOSIS — G9341 Metabolic encephalopathy: Secondary | ICD-10-CM | POA: Diagnosis not present

## 2023-01-18 LAB — BASIC METABOLIC PANEL
Anion gap: 6 (ref 5–15)
BUN: 19 mg/dL (ref 8–23)
CO2: 26 mmol/L (ref 22–32)
Calcium: 11 mg/dL — ABNORMAL HIGH (ref 8.9–10.3)
Chloride: 109 mmol/L (ref 98–111)
Creatinine, Ser: 1.17 mg/dL — ABNORMAL HIGH (ref 0.44–1.00)
GFR, Estimated: 43 mL/min — ABNORMAL LOW (ref >60)
Glucose, Bld: 101 mg/dL — ABNORMAL HIGH (ref 70–99)
Potassium: 3.9 mmol/L (ref 3.5–5.1)
Sodium: 141 mmol/L (ref 135–145)

## 2023-01-18 NOTE — Progress Notes (Signed)
Discharge teaching complete. Meds, diet, activity, follow up appointments reviewed and all questions answered. Copy of instructions given to patient. Daughter present during education.

## 2023-01-18 NOTE — Discharge Summary (Signed)
Physician Discharge Summary  Stephanie Atkinson VWU:[id number] DOB: 08/01/1924 DOA: 01/16/2023  PCP: Linus Galas, NP  Admit date: 01/16/2023  Discharge date: 01/18/2023  Admitted From: Home  Disposition:  Home Health services.  Recommendations for Outpatient Follow-up:  Follow up with PCP in 1-2 weeks. Please obtain BMP/CBC in one week. Advised to hold vitamin D supplementation. Advised PCP to recheck her vitamin D levels.  Home Health: HHPT/OT Equipment/Devices:None  Discharge Condition: Stable CODE STATUS:Full code Diet recommendation: Heart Healthy   Brief [facility] Course: This 89 y.o. female with PMH significant for coronary artery disease status post CABG, hypertension, hyperlipidemia, hypothyroidism, atrial fibrillation on Eliquis, symptomatic bradycardia status post PPM, chronic systolic heart failure on Entresto, spironolactone and Lasix as needed presented in the ED with confusion.  She is functionally independent, has been having intermittent dizziness for some time.  She was seen well in the night around 9 PM and in the morning when daughter went to her house,  she was found in the bed,  lethargic, She called EMS and patient was brought in the ED.  On arrival in the ED patient continued to remain confused, slowly has improved and now back to her baseline.  Workup consistent with hypercalcemia which has now improved.  PT and OT recommended home health PT.  Patient continued to complain about lower back pain, x-ray lumbar spine and hip shows osteoarthritic changes. Patient denies any urinary symptoms.  Mental status back to baseline.  High calcium level could be due to vitamin D supplementation.  She does have high calcium level around 11.0  which could be normal for last few years.  Advised to hold vitamin D,  advised for her to follow with PCP, check vitamin D levels.  Patient is being discharged home home health services arranged.   Discharge Diagnoses:  Principal  Problem:   Acute metabolic encephalopathy Active Problems:   Symptomatic bradycardia s/p PPM    Chronic kidney disease, stage 3b (HCC)   Hypercalcemia   Permanent atrial fibrillation (HCC)   Hypertension   Hypothyroidism   Hx of CABG X 4 3/06-    Hypercholesterolemia   Chronic diastolic heart failure (HCC)  Acute metabolic encephalopathy: > Resolved. Patient was found lethargic in the bed in the morning. This could be multifactorial, could be UTI or hypercalcemia. CT head > No acute intracranial abnormality.  There is no focal neurological deficit. Less likely CVA. Chest x-ray shows chronic cardiomegaly but no infiltrate. There is no signs concerning for infection. UA shows small leukocytes but patient denies any urinary symptoms. She is back to her baseline mental status.   Hypercalcemia: Her calcium is 11.0,  has been running high for last few times. Continue gentle IV hydration for 12 hours. Recheck calcium improving. 10.7   Lower Back pain: Patient complains of worsening lower back pain. X-ray lumbar spine and pelvis showed degenerative changes. Continue hydrocodone as needed for pain.   Chronic systolic CHF: Last echocardiogram shows LVEF 35 to 40%. BNP 499.1  Continue Entresto, spironolactone, Lasix. Does not appear in acute exacerbation.   CKD stage III: Serum creatinine at baseline.   CAD status post CABG: Continue Coreg, Lipitor, Eliquis   Hypothyroidism: Continue levothyroxine   Permanent atrial fibrillation: HR controlled. Continue Eliquis and Coreg.   Status post PPM: Stable, Follow-up outpatient   Chronic arthritis. Continue tramadol as needed for pain   Generalized weakness Continue to recommend home health services  Discharge Instructions  Discharge Instructions     Call MD  for:  difficulty breathing, headache or visual disturbances   Complete by: As directed    Call MD for:  persistant dizziness or light-headedness   Complete by: As  directed    Call MD for:  persistant nausea and vomiting   Complete by: As directed    Diet - low sodium heart healthy   Complete by: As directed    Diet Carb Modified   Complete by: As directed    Discharge instructions   Complete by: As directed    Advised to follow up with PCP in one week. Advised to hold vitamin D supplementation. Advised PCP to recheck her vitamin D levels.   Increase activity slowly   Complete by: As directed       Allergies as of 01/18/2023       Reactions   Penicillins Hives   Sulfa Antibiotics Hives, Swelling   Gabapentin Other (See Comments)   Confusion   Lyrica [pregabalin] Other (See Comments)   Confusion        Medication List     TAKE these medications    acetaminophen 500 MG tablet Commonly known as: TYLENOL Take 1 tablet (500 mg total) by mouth every 6 (six) hours as needed for moderate pain. What changed:  how much to take when to take this   carvedilol 3.125 MG tablet Commonly known as: COREG Take 1 tablet (3.125 mg total) by mouth 2 (two) times daily.   Eliquis 2.5 MG Tabs tablet Generic drug: apixaban TAKE 1 TABLET BY MOUTH TWICE A DAY   furosemide 20 MG tablet Commonly known as: LASIX Take 1 tablet (20 mg total) by mouth daily. Take two tablets in case of weight gain 2 to 3 lbs in 24 hrs or 5 lbs in 7 days, until weight back to baseline.   levothyroxine 50 MCG tablet Commonly known as: SYNTHROID Take 50 mcg by mouth daily before breakfast.   Lipitor 80 MG tablet Generic drug: atorvastatin Take 80 mg by mouth at bedtime.   sacubitril-valsartan 49-51 MG Commonly known as: ENTRESTO Take 1 tablet by mouth 2 (two) times daily.   spironolactone 25 MG tablet Commonly known as: ALDACTONE Take 0.5 tablets (12.5 mg total) by mouth daily.   SYSTANE OP Place 1 drop into both eyes daily as needed (dry eye).   traMADol 50 MG tablet Commonly known as: ULTRAM Take 1 tablet (50 mg total) by mouth 2 (two) times  daily. What changed: when to take this   Vitamin D3 50 MCG (2000 UT) capsule Take 1 capsule by mouth once a week.        Follow-up Information     [facility] Follow up.   Why: HOme health services of PT OT and RN. They will call to restart services after hospitalization.        Linus Galas, NP Follow up in 1 week(s).   Contact information: [address] [place] [address] [place] [address] [phone]                Allergies  Allergen Reactions   Penicillins Hives   Sulfa Antibiotics Hives and Swelling   Gabapentin Other (See Comments)    Confusion   Lyrica [Pregabalin] Other (See Comments)    Confusion    Consultations: None   Procedures/Studies: DG Lumbar Spine 2-3 Views  Result Date: 01/17/2023 CLINICAL DATA:  Back pain EXAM: LUMBAR SPINE - 2-3 VIEW COMPARISON:  Lumbar spine CT 08/18/2022. FINDINGS: Bony demineralization hinders assessment. Within this limitation  no lumbar spine fracture evident. No subluxation in the lumbar spine. Loss of disc height noted T12-L1 and L1-2. Atherosclerotic calcification noted abdominal aorta. IMPRESSION: 1. Bony demineralization without lumbar spine fracture. 2. Degenerative disc disease at T12-L1 and L1-2. Electronically Signed   By: Kennith Center M.D.   On: 01/17/2023 17:11   DG HIPS BILAT WITH PELVIS 2V  Result Date: 01/17/2023 CLINICAL DATA:  Left hip pain. EXAM: DG HIP (WITH OR WITHOUT PELVIS) 2V BILAT COMPARISON:  None Available. FINDINGS: Bones are diffusely demineralized. Diffuse gaseous distention of small bowel and colon noted within the visualized pelvis. Patient is status post right total hip replacement without evidence for dislocation or periprosthetic fracture. Loss of joint space noted in the left hip with mild hypertrophic spurring. Linear density overlying the subcapital region of the left femoral neck is probably secondary to spurring. IMPRESSION: 1. Degenerative changes in the left hip. Slight  cortical overhang with linear density overlying the subcapital region of the femoral neck is probably related to spurring with nondisplaced femoral neck fracture a less likely consideration. CT or MR could be used to further evaluate as clinically warranted. 2. Status post right total hip replacement without evidence for dislocation or periprosthetic fracture. Electronically Signed   By: Kennith Center M.D.   On: 01/17/2023 17:08   CT Head Wo Contrast  Result Date: 01/16/2023 CLINICAL DATA:  Mental status change, unknown cause EXAM: CT HEAD WITHOUT CONTRAST TECHNIQUE: Contiguous axial images were obtained from the base of the skull through the vertex without intravenous contrast. RADIATION DOSE REDUCTION: This exam was performed according to the departmental dose-optimization program which includes automated exposure control, adjustment of the mA and/or kV according to patient size and/or use of iterative reconstruction technique. COMPARISON:  Head CT 01/08/2023 FINDINGS: Brain: No hemorrhage. No hydrocephalus. No extra-axial fluid collection. No CT evidence of an acute cortical infarct. No mass effect. No mass lesion. There are chronic infarcts in the left thalamus, left basal ganglia, and superior right cerebellum Vascular: No hyperdense vessel or unexpected calcification. Skull: Normal. Negative for fracture or focal lesion. Sinuses/Orbits: No middle ear or mastoid effusion. Paranasal sinuses are notable for mild mucosal thickening in the right maxillary sinus. Bilateral lens replacement. Orbits are otherwise unremarkable. Other: None. IMPRESSION: No acute intracranial abnormality Electronically Signed   By: Lorenza Cambridge M.D.   On: 01/16/2023 12:08   DG Chest Port 1 View  Result Date: 01/16/2023 CLINICAL DATA:  89 year old female with possible sepsis. EXAM: PORTABLE CHEST 1 VIEW COMPARISON:  Portable chest 01/08/2023 and earlier. FINDINGS: Portable AP semi upright view at 1031 hours. Chronic left chest  cardiac pacemaker. Stable cardiomegaly and mediastinal contours. Calcified aortic atherosclerosis. Sequelae of CABG. Stable lung volumes. No pneumothorax. Increased pulmonary vascularity without overt edema. No consolidation. Difficult to exclude small pleural effusions. Negative visible bowel gas. Stable visualized osseous structures. IMPRESSION: 1. Chronic cardiomegaly with increased pulmonary vascularity from earlier this month but no overt edema. Difficult to exclude small pleural effusions. 2.  Aortic Atherosclerosis (ICD10-I70.0). Electronically Signed   By: Odessa Fleming M.D.   On: 01/16/2023 11:26   CUP PACEART REMOTE DEVICE CHECK  Result Date: 01/11/2023 Scheduled remote reviewed. Normal device function.  Next remote 91 days. ML, CVRS  ECHOCARDIOGRAM COMPLETE  Result Date: 01/09/2023    ECHOCARDIOGRAM REPORT   Patient Name:   HAYZLEE ENWRIGHT Date of Exam: 01/09/2023 Medical Rec #:  [id number]     Height:       62.0 in Accession #:  [id number]    Weight:       135.4 lb Date of Birth:  04/30/1924     BSA:          1.620 m Patient Age:    89 years      BP:           145/61 mmHg Patient Gender: F             HR:           61 bpm. Exam Location:  Inpatient Procedure: 2D Echo, Color Doppler and Cardiac Doppler Indications:    R55 Syncope  History:        Patient has prior history of Echocardiogram examinations, most                 recent 05/21/2022. CHF, CAD and Previous Myocardial Infarction,                 Abnormal ECG, Prior CABG and Pacemaker; Signs/Symptoms:Syncope.  Sonographer:    Sheralyn Boatman RDCS Referring Phys: [id number] RONDELL A SMITH IMPRESSIONS  1. Abnormal (paradoxical) septal motion, consistent with RV pacemaker.     . Left ventricular ejection fraction, by estimation, is 35 to 40%. The left ventricle has moderately decreased function. The left ventricle demonstrates global hypokinesis. There is mild left ventricular hypertrophy. Left ventricular diastolic parameters are indeterminate.  2. Right  ventricular systolic function is normal. The right ventricular size is normal. There is normal pulmonary artery systolic pressure.  3. Left atrial size was severely dilated.  4. Right atrial size was severely dilated.  5. The mitral valve is degenerative. Trivial mitral valve regurgitation. No evidence of mitral stenosis.  6. The aortic valve is tricuspid. Aortic valve regurgitation is trivial. No aortic stenosis is present.  7. Pulmonic valve regurgitation is moderate. FINDINGS  Left Ventricle: Abnormal (paradoxical) septal motion, consistent with RV pacemaker. Left ventricular ejection fraction, by estimation, is 35 to 40%. The left ventricle has moderately decreased function. The left ventricle demonstrates global hypokinesis. The left ventricular internal cavity size was normal in size. There is mild left ventricular hypertrophy. Left ventricular diastolic function could not be evaluated due to atrial fibrillation. Left ventricular diastolic parameters are indeterminate. Indeterminate filling pressures. Right Ventricle: The right ventricular size is normal. No increase in right ventricular wall thickness. Right ventricular systolic function is normal. There is normal pulmonary artery systolic pressure. The tricuspid regurgitant velocity is 2.29 m/s, and  with an assumed right atrial pressure of 3 mmHg, the estimated right ventricular systolic pressure is 24.0 mmHg. Left Atrium: Left atrial size was severely dilated. Right Atrium: Right atrial size was severely dilated. Pericardium: Trivial pericardial effusion is present. Mitral Valve: The mitral valve is degenerative in appearance. Trivial mitral valve regurgitation. No evidence of mitral valve stenosis. Tricuspid Valve: The tricuspid valve is normal in structure. Tricuspid valve regurgitation is mild . No evidence of tricuspid stenosis. Aortic Valve: The aortic valve is tricuspid. Aortic valve regurgitation is trivial. No aortic stenosis is present. Pulmonic  Valve: The pulmonic valve was normal in structure. Pulmonic valve regurgitation is moderate. No evidence of pulmonic stenosis. Aorta: The aortic root and ascending aorta are structurally normal, with no evidence of dilitation. IAS/Shunts: No atrial level shunt detected by color flow Doppler. Additional Comments: A device lead is visualized.  LEFT VENTRICLE PLAX 2D LVIDd:         3.90 cm     Diastology LVIDs:         3.40  cm     LV e' medial:    4.25 cm/s LV PW:         1.02 cm     LV E/e' medial:  17.9 LV IVS:        1.07 cm     LV e' lateral:   7.06 cm/s LVOT diam:     2.20 cm     LV E/e' lateral: 10.8 LV SV:         43 LV SV Index:   26 LVOT Area:     3.80 cm  LV Volumes (MOD) LV vol d, MOD A2C: 48.1 ml LV vol d, MOD A4C: 42.4 ml LV vol s, MOD A2C: 31.7 ml LV vol s, MOD A4C: 25.2 ml LV SV MOD A2C:     16.4 ml LV SV MOD A4C:     42.4 ml LV SV MOD BP:      17.9 ml RIGHT VENTRICLE            IVC RV S prime:     7.37 cm/s  IVC diam: 1.50 cm TAPSE (M-mode): 0.8 cm LEFT ATRIUM             Index        RIGHT ATRIUM           Index LA diam:        4.40 cm 2.72 cm/m   RA Area:     22.80 cm LA Vol (A2C):   80.8 ml 49.89 ml/m  RA Volume:   69.70 ml  43.03 ml/m LA Vol (A4C):   65.9 ml 40.69 ml/m LA Biplane Vol: 75.5 ml 46.62 ml/m  AORTIC VALVE             PULMONIC VALVE LVOT Vmax:   62.50 cm/s  PR End Diast Vel: 1.35 msec LVOT Vmean:  43.400 cm/s LVOT VTI:    0.112 m  AORTA Ao Root diam: 2.80 cm Ao Asc diam:  3.30 cm MITRAL VALVE               TRICUSPID VALVE MV Area (PHT): 4.49 cm    TR Peak grad:   21.0 mmHg MV Decel Time: 169 msec    TR Vmax:        229.00 cm/s MV E velocity: 76.00 cm/s                            SHUNTS                            Systemic VTI:  0.11 m                            Systemic Diam: 2.20 cm Chilton Si MD Electronically signed by Chilton Si MD Signature Date/Time: 01/09/2023/3:34:03 PM    Final    DG Chest Port 1 View  Result Date: 01/08/2023 CLINICAL DATA:  89 year old  female with syncope, dizziness. EXAM: PORTABLE CHEST 1 VIEW COMPARISON:  Portable chest 08/18/2022 and earlier. FINDINGS: Portable AP semi upright view at 0516 hours. Improved lung volumes. Stable cardiomegaly and mediastinal contours. Prior CABG. Stable left chest single lead cardiac pacemaker. Chronic lung base hypoinflation. Otherwise Allowing for portable technique the lungs are clear. No pulmonary edema. Stable visualized osseous structures. Negative visible bowel gas. IMPRESSION: Chronic cardiomegaly, CABG, single lead cardiac pacemaker. Chronic but improved lung  base hypo ventilation since June. No acute cardiopulmonary abnormality. Electronically Signed   By: Odessa Fleming M.D.   On: 01/08/2023 06:14   CT Head Wo Contrast  Result Date: 01/08/2023 CLINICAL DATA:  Syncope/presyncope with cerebrovascular cause suspected. Dizziness with near syncope episode EXAM: CT HEAD WITHOUT CONTRAST TECHNIQUE: Contiguous axial images were obtained from the base of the skull through the vertex without intravenous contrast. RADIATION DOSE REDUCTION: This exam was performed according to the departmental dose-optimization program which includes automated exposure control, adjustment of the mA and/or kV according to patient size and/or use of iterative reconstruction technique. COMPARISON:  11/14/2022 FINDINGS: Brain: No evidence of acute infarction, hemorrhage, hydrocephalus, extra-axial collection or mass lesion/mass effect. Small chronic infarcts in the right cerebellum and left thalamus. Ischemic gliosis in the cerebral white matter with mild volume loss. Vascular: No hyperdense vessel or unexpected calcification. Skull: Normal. Negative for fracture or focal lesion. Sinuses/Orbits: No acute finding. IMPRESSION: No acute finding. Chronic small vessel disease. Electronically Signed   By: Tiburcio Pea M.D.   On: 01/08/2023 05:23      Subjective: Patient was seen and examined at bedside.  Overnight events noted.    Patient reports doing much better.  She wants to be discharged.  Home health services arranged.  Discharge Exam: Vitals:   01/18/23 0334 01/18/23 1214  BP: (!) 140/78 (!) 148/66  Pulse: (!) 59 60  Resp: 18 16  Temp: (!) 97.5 F (36.4 C) 97.7 F (36.5 C)  SpO2: 99% 92%   Vitals:   01/17/23 2211 01/17/23 2215 01/18/23 0334 01/18/23 1214  BP: (!) 133/53 (!) 133/53 (!) 140/78 (!) 148/66  Pulse: 62  (!) 59 60  Resp:   18 16  Temp:   (!) 97.5 F (36.4 C) 97.7 F (36.5 C)  TempSrc:   Oral Oral  SpO2:   99% 92%  Weight:      Height:        General: Pt is alert, awake, not in acute distress Cardiovascular: RRR, S1/S2 +, no rubs, no gallops Respiratory: CTA bilaterally, no wheezing, no rhonchi Abdominal: Soft, NT, ND, bowel sounds + Extremities: no edema, no cyanosis    The results of significant diagnostics from this hospitalization (including imaging, microbiology, ancillary and laboratory) are listed below for reference.     Microbiology: Recent Results (from the past 240 hour(s))  Blood Culture (routine x 2)     Status: None (Preliminary result)   Collection Time: 01/16/23 10:19 AM   Specimen: BLOOD RIGHT ARM  Result Value Ref Range Status   Specimen Description BLOOD RIGHT ARM  Final   Special Requests   Final    BOTTLES DRAWN AEROBIC AND ANAEROBIC Blood Culture results may not be optimal due to an inadequate volume of blood received in culture bottles   Culture   Final    NO GROWTH 2 DAYS Performed at [facility], [address] [place], [place] [address]    Report Status PENDING  Incomplete  Blood Culture (routine x 2)     Status: None (Preliminary result)   Collection Time: 01/16/23 10:24 AM   Specimen: BLOOD LEFT ARM  Result Value Ref Range Status   Specimen Description BLOOD LEFT ARM  Final   Special Requests   Final    BOTTLES DRAWN AEROBIC AND ANAEROBIC Blood Culture adequate volume   Culture   Final    NO GROWTH 2 DAYS Performed at [facility], [address] [place],  [place] [address]    Report Status PENDING  Incomplete     Labs: BNP (last 3 results) Recent Labs    01/08/23 0527 01/16/23 1019  BNP 273.7* 499.1*   Basic Metabolic Panel: Recent Labs  Lab 01/16/23 1200 01/17/23 0540 01/18/23 0331  NA 139 139 141  K 3.7 4.1 3.9  CL 108 109 109  CO2 24 27 26   GLUCOSE 84 97 101*  BUN 16 13 19   CREATININE 1.10* 1.08* 1.17*  CALCIUM 11.0* 10.7* 11.0*  MG 2.5* 2.5*  --   PHOS  --  2.9  --    Liver Function Tests: Recent Labs  Lab 01/16/23 1200  AST 29  ALT 26  ALKPHOS 59  BILITOT 1.3*  PROT 6.3*  ALBUMIN 3.4*   No results for input(s): "LIPASE", "AMYLASE" in the last 168 hours. Recent Labs  Lab 01/16/23 1039  AMMONIA 18   CBC: Recent Labs  Lab 01/16/23 1019 01/17/23 0540  WBC 3.4* 3.4*  NEUTROABS 2.0  --   HGB 14.8 14.1  HCT 40.7 44.4  MCV 97.4 93.5  PLT 174 185   Cardiac Enzymes: No results for input(s): "CKTOTAL", "CKMB", "CKMBINDEX", "TROPONINI" in the last 168 hours. BNP: Invalid input(s): "POCBNP" CBG: No results for input(s): "GLUCAP" in the last 168 hours. D-Dimer No results for input(s): "DDIMER" in the last 72 hours. Hgb A1c No results for input(s): "HGBA1C" in the last 72 hours. Lipid Profile No results for input(s): "CHOL", "HDL", "LDLCALC", "TRIG", "CHOLHDL", "LDLDIRECT" in the last 72 hours. Thyroid function studies No results for input(s): "TSH", "T4TOTAL", "T3FREE", "THYROIDAB" in the last 72 hours.  Invalid input(s): "FREET3" Anemia work up No results for input(s): "VITAMINB12", "FOLATE", "FERRITIN", "TIBC", "IRON", "RETICCTPCT" in the last 72 hours. Urinalysis    Component Value Date/Time   COLORURINE YELLOW 01/17/2023 0500   APPEARANCEUR CLEAR 01/17/2023 0500   LABSPEC 1.009 01/17/2023 0500   PHURINE 6.0 01/17/2023 0500   GLUCOSEU NEGATIVE 01/17/2023 0500   HGBUR NEGATIVE 01/17/2023 0500   BILIRUBINUR NEGATIVE 01/17/2023 0500   KETONESUR NEGATIVE  01/17/2023 0500   PROTEINUR NEGATIVE 01/17/2023 0500   UROBILINOGEN 1.0 10/14/2012 0922   NITRITE NEGATIVE 01/17/2023 0500   LEUKOCYTESUR SMALL (A) 01/17/2023 0500   Sepsis Labs Recent Labs  Lab 01/16/23 1019 01/17/23 0540  WBC 3.4* 3.4*   Microbiology Recent Results (from the past 240 hour(s))  Blood Culture (routine x 2)     Status: None (Preliminary result)   Collection Time: 01/16/23 10:19 AM   Specimen: BLOOD RIGHT ARM  Result Value Ref Range Status   Specimen Description BLOOD RIGHT ARM  Final   Special Requests   Final    BOTTLES DRAWN AEROBIC AND ANAEROBIC Blood Culture results may not be optimal due to an inadequate volume of blood received in culture bottles   Culture   Final    NO GROWTH 2 DAYS Performed at [facility], [address], [place], [place] [address]    Report Status PENDING  Incomplete  Blood Culture (routine x 2)     Status: None (Preliminary result)   Collection Time: 01/16/23 10:24 AM   Specimen: BLOOD LEFT ARM  Result Value Ref Range Status   Specimen Description BLOOD LEFT ARM  Final   Special Requests   Final    BOTTLES DRAWN AEROBIC AND ANAEROBIC Blood Culture adequate volume   Culture   Final    NO GROWTH 2 DAYS Performed at [facility], [address] [place], [place] [address]    Report Status PENDING  Incomplete     Time coordinating discharge: Over 30 minutes  SIGNED:   Willeen Niece, MD  [facility] 01/18/2023, 3:33 PM Pager   If 7PM-7AM, please contact night-coverage

## 2023-01-18 NOTE — TOC Transition Note (Signed)
Transition of Care [facility]) - CM/SW Discharge Note   Patient Details  Name: Stephanie Atkinson MRN: [id number] Date of Birth: March 06, 1924  [facility] CM/SW Contact:  Ronny Bacon, RN Phone Number: 01/18/2023, 1:10 PM   Clinical Narrative:   Patient is being discharged today. Call to patient room, daughter answered phone and confirmed that the patient has a ride home. [name] with [facility] aware of patient being discharged.    Final next level of care: Home w Home Health Services Barriers to Discharge: No Barriers Identified   Patient Goals and CMS Choice      Discharge Placement                         Discharge Plan and Services Additional resources added to the After Visit Summary for                                       Social Determinants of Health (SDOH) Interventions SDOH Screenings   Food Insecurity: No Food Insecurity (01/16/2023)  Housing: Low Risk  (01/16/2023)  Transportation Needs: No Transportation Needs (01/16/2023)  Utilities: Not At Risk (01/16/2023)  Social Connections: Unknown (12/18/2021)   Received from [facility], [facility]  Tobacco Use: High Risk (01/16/2023)     Readmission Risk Interventions     No data to display

## 2023-01-18 NOTE — Plan of Care (Signed)
Problem: Health Behavior/Discharge Planning: Goal: Ability to manage health-related needs will improve Outcome: Progressing   Problem: Clinical Measurements: Goal: Ability to maintain clinical measurements within normal limits will improve Outcome: Progressing Goal: Respiratory complications will improve Outcome: Progressing Goal: Cardiovascular complication will be avoided Outcome: Progressing   Problem: Activity: Goal: Risk for activity intolerance will decrease Outcome: Progressing   Problem: Nutrition: Goal: Adequate nutrition will be maintained Outcome: Progressing   Problem: Pain Management: Goal: General experience of comfort will improve Outcome: Progressing   Problem: Safety: Goal: Ability to remain free from injury will improve Outcome: Progressing

## 2023-01-18 NOTE — Discharge Instructions (Signed)
Advised to hold vitamin D supplementation. Advised PCP to recheck her vitamin D levels.

## 2023-01-18 NOTE — Progress Notes (Signed)
Mobility Specialist Progress Note:    01/18/23 1219  Mobility  Activity Ambulated with assistance in hallway  Level of Assistance Standby assist, set-up cues, supervision of patient - no hands on  Assistive Device Four wheel walker  Distance Ambulated (ft) 350 ft  Activity Response Tolerated well  Mobility Referral Yes  $Mobility charge 1 Mobility  Mobility Specialist Start Time (ACUTE ONLY) 1200  Mobility Specialist Stop Time (ACUTE ONLY) 1212  Mobility Specialist Time Calculation (min) (ACUTE ONLY) 12 min   Received pt in bed having no complaints and agreeable to mobility. Prior to ambulating in the halls pt requested to use the [facility], void complete. Pt was asymptomatic throughout ambulation and returned to room w/o fault. Left seated EOB w/ call bell in reach and all needs met.   Thompson Grayer Mobility Specialist  Please contact vis Secure Chat or  Rehab Office [phone]

## 2023-01-20 ENCOUNTER — Ambulatory Visit: Payer: Medicare PPO | Admitting: Nurse Practitioner

## 2023-01-21 ENCOUNTER — Other Ambulatory Visit: Payer: Self-pay | Admitting: Cardiovascular Disease

## 2023-01-21 ENCOUNTER — Telehealth: Payer: Self-pay | Admitting: Physician Assistant

## 2023-01-21 DIAGNOSIS — N1832 Chronic kidney disease, stage 3b: Secondary | ICD-10-CM | POA: Diagnosis not present

## 2023-01-21 DIAGNOSIS — I130 Hypertensive heart and chronic kidney disease with heart failure and stage 1 through stage 4 chronic kidney disease, or unspecified chronic kidney disease: Secondary | ICD-10-CM | POA: Diagnosis not present

## 2023-01-21 DIAGNOSIS — R55 Syncope and collapse: Secondary | ICD-10-CM | POA: Diagnosis not present

## 2023-01-21 DIAGNOSIS — I4821 Permanent atrial fibrillation: Secondary | ICD-10-CM | POA: Diagnosis not present

## 2023-01-21 DIAGNOSIS — I2510 Atherosclerotic heart disease of native coronary artery without angina pectoris: Secondary | ICD-10-CM | POA: Diagnosis not present

## 2023-01-21 DIAGNOSIS — I5022 Chronic systolic (congestive) heart failure: Secondary | ICD-10-CM | POA: Diagnosis not present

## 2023-01-21 DIAGNOSIS — E039 Hypothyroidism, unspecified: Secondary | ICD-10-CM | POA: Diagnosis not present

## 2023-01-21 DIAGNOSIS — Z7901 Long term (current) use of anticoagulants: Secondary | ICD-10-CM | POA: Diagnosis not present

## 2023-01-21 LAB — CULTURE, BLOOD (ROUTINE X 2)
Culture: NO GROWTH
Culture: NO GROWTH
Special Requests: ADEQUATE

## 2023-01-21 NOTE — Telephone Encounter (Signed)
*  STAT* If patient is at the pharmacy, call can be transferred to refill team.   1. Which medications need to be refilled? (please list name of each medication and dose if known) spironolactone (ALDACTONE) 25 MG tablet    2. Would you like to learn more about the convenience, safety, & potential cost savings by using the [facility]?     3. Are you open to using the [facility] (Type [facility]. ).   4. Which pharmacy/location (including street and city if local pharmacy) is medication to be sent to? [id number] [place], [place] - [address]    5. Do they need a 30 day or 90 day supply? 90

## 2023-01-22 ENCOUNTER — Telehealth: Payer: Self-pay | Admitting: *Deleted

## 2023-01-22 ENCOUNTER — Other Ambulatory Visit: Payer: Self-pay

## 2023-01-22 DIAGNOSIS — I5022 Chronic systolic (congestive) heart failure: Secondary | ICD-10-CM | POA: Diagnosis not present

## 2023-01-22 DIAGNOSIS — R55 Syncope and collapse: Secondary | ICD-10-CM | POA: Diagnosis not present

## 2023-01-22 DIAGNOSIS — I4821 Permanent atrial fibrillation: Secondary | ICD-10-CM | POA: Diagnosis not present

## 2023-01-22 DIAGNOSIS — N1832 Chronic kidney disease, stage 3b: Secondary | ICD-10-CM | POA: Diagnosis not present

## 2023-01-22 DIAGNOSIS — Z7901 Long term (current) use of anticoagulants: Secondary | ICD-10-CM | POA: Diagnosis not present

## 2023-01-22 DIAGNOSIS — E039 Hypothyroidism, unspecified: Secondary | ICD-10-CM | POA: Diagnosis not present

## 2023-01-22 DIAGNOSIS — I2510 Atherosclerotic heart disease of native coronary artery without angina pectoris: Secondary | ICD-10-CM | POA: Diagnosis not present

## 2023-01-22 DIAGNOSIS — I130 Hypertensive heart and chronic kidney disease with heart failure and stage 1 through stage 4 chronic kidney disease, or unspecified chronic kidney disease: Secondary | ICD-10-CM | POA: Diagnosis not present

## 2023-01-22 MED ORDER — SPIRONOLACTONE 25 MG PO TABS: 25.0000 mg | ORAL_TABLET | Freq: Every day | ORAL | 3 refills | Status: DC

## 2023-01-22 NOTE — Telephone Encounter (Signed)
RX sent to requested Pharmacy

## 2023-01-22 NOTE — Progress Notes (Signed)
  Care Coordination Note  01/22/2023 Name: CHYANNE PADDOCK MRN: [id number] DOB: [birth year]  ALESHIA HILDENBRANDT is a 89 y.o. year old female who is a primary care patient of Linus Galas, NP and is actively engaged with the care management team. I reached out to Caryl Asp by phone today to assist with re-scheduling an initial visit with the RN Case Manager  Follow up plan: Patient declines further follow up and engagement by the care management team. Appropriate care team members and provider have been notified via electronic communication. Pt daughter [name] says they have home health nurse coming weekly and declined services at this time   Burman Nieves, [facility] Care Coordination Care Guide Direct Dial: [phone]

## 2023-01-22 NOTE — Progress Notes (Signed)
Remote pacemaker transmission.   

## 2023-01-23 ENCOUNTER — Other Ambulatory Visit: Payer: Self-pay | Admitting: Cardiovascular Disease

## 2023-01-24 DIAGNOSIS — I4821 Permanent atrial fibrillation: Secondary | ICD-10-CM | POA: Diagnosis not present

## 2023-01-24 DIAGNOSIS — E039 Hypothyroidism, unspecified: Secondary | ICD-10-CM | POA: Diagnosis not present

## 2023-01-24 DIAGNOSIS — R55 Syncope and collapse: Secondary | ICD-10-CM | POA: Diagnosis not present

## 2023-01-24 DIAGNOSIS — I2510 Atherosclerotic heart disease of native coronary artery without angina pectoris: Secondary | ICD-10-CM | POA: Diagnosis not present

## 2023-01-24 DIAGNOSIS — Z7901 Long term (current) use of anticoagulants: Secondary | ICD-10-CM | POA: Diagnosis not present

## 2023-01-24 DIAGNOSIS — I5022 Chronic systolic (congestive) heart failure: Secondary | ICD-10-CM | POA: Diagnosis not present

## 2023-01-24 DIAGNOSIS — N1832 Chronic kidney disease, stage 3b: Secondary | ICD-10-CM | POA: Diagnosis not present

## 2023-01-24 DIAGNOSIS — I130 Hypertensive heart and chronic kidney disease with heart failure and stage 1 through stage 4 chronic kidney disease, or unspecified chronic kidney disease: Secondary | ICD-10-CM | POA: Diagnosis not present

## 2023-01-27 ENCOUNTER — Telehealth: Payer: Self-pay | Admitting: *Deleted

## 2023-01-27 DIAGNOSIS — I130 Hypertensive heart and chronic kidney disease with heart failure and stage 1 through stage 4 chronic kidney disease, or unspecified chronic kidney disease: Secondary | ICD-10-CM | POA: Diagnosis not present

## 2023-01-27 DIAGNOSIS — E039 Hypothyroidism, unspecified: Secondary | ICD-10-CM | POA: Diagnosis not present

## 2023-01-27 DIAGNOSIS — N1832 Chronic kidney disease, stage 3b: Secondary | ICD-10-CM | POA: Diagnosis not present

## 2023-01-27 DIAGNOSIS — I5022 Chronic systolic (congestive) heart failure: Secondary | ICD-10-CM | POA: Diagnosis not present

## 2023-01-27 DIAGNOSIS — Z79899 Other long term (current) drug therapy: Secondary | ICD-10-CM | POA: Diagnosis not present

## 2023-01-27 DIAGNOSIS — Z7901 Long term (current) use of anticoagulants: Secondary | ICD-10-CM | POA: Diagnosis not present

## 2023-01-27 DIAGNOSIS — I4821 Permanent atrial fibrillation: Secondary | ICD-10-CM | POA: Diagnosis not present

## 2023-01-27 DIAGNOSIS — R55 Syncope and collapse: Secondary | ICD-10-CM | POA: Diagnosis not present

## 2023-01-27 DIAGNOSIS — I2510 Atherosclerotic heart disease of native coronary artery without angina pectoris: Secondary | ICD-10-CM | POA: Diagnosis not present

## 2023-01-27 NOTE — Telephone Encounter (Signed)
   Telephone encounter was:  Unsuccessful.  01/27/2023 Name: Stephanie Atkinson MRN: [id number] DOB: 25-Jul-1924  Unsuccessful outbound call made today to assist with:  Transportation Needs   Outreach Attempt:  1st Attempt  A HIPAA compliant voice message was left requesting a return call.  Instructed patient to call back at [phone].  Dione Booze [facility]  Population Health Careguide  Direct Dial: [phone] Website: [email or web address]

## 2023-01-27 NOTE — Telephone Encounter (Signed)
   Telephone encounter was:  Successful.  01/27/2023 Name: Stephanie Atkinson MRN: [id number] DOB: 1924/05/04  Stephanie Atkinson is a 89 y.o. year old female who is a primary care patient of Linus Galas, NP . The community resource team was consulted for assistance with Food InsecurityPatient daughter requesting Ihor Gully referal put in through [facility] also advised to call [employer] for dine after hospita; dc   Care guide performed the following interventions: Follow up call placed to the patient to discuss status of referral.  Follow Up Plan:  No further follow up planned at this time. The patient has been provided with needed resources.  Dione Booze [facility]  Population Health Careguide  Direct Dial: [phone] Website: [email or web address]

## 2023-01-28 LAB — BASIC METABOLIC PANEL
BUN/Creatinine Ratio: 11 — ABNORMAL LOW (ref 12–28)
BUN: 12 mg/dL (ref 10–36)
CO2: 23 mmol/L (ref 20–29)
Calcium: 10.6 mg/dL — ABNORMAL HIGH (ref 8.7–10.3)
Chloride: 104 mmol/L (ref 96–106)
Creatinine, Ser: 1.12 mg/dL — ABNORMAL HIGH (ref 0.57–1.00)
Glucose: 98 mg/dL (ref 70–99)
Potassium: 4.3 mmol/L (ref 3.5–5.2)
Sodium: 142 mmol/L (ref 134–144)
eGFR: 45 mL/min/{1.73_m2} — ABNORMAL LOW (ref >59)

## 2023-01-29 ENCOUNTER — Telehealth: Payer: Self-pay | Admitting: *Deleted

## 2023-01-29 DIAGNOSIS — I130 Hypertensive heart and chronic kidney disease with heart failure and stage 1 through stage 4 chronic kidney disease, or unspecified chronic kidney disease: Secondary | ICD-10-CM | POA: Diagnosis not present

## 2023-01-29 DIAGNOSIS — I5022 Chronic systolic (congestive) heart failure: Secondary | ICD-10-CM | POA: Diagnosis not present

## 2023-01-29 DIAGNOSIS — I4821 Permanent atrial fibrillation: Secondary | ICD-10-CM | POA: Diagnosis not present

## 2023-01-29 DIAGNOSIS — I2510 Atherosclerotic heart disease of native coronary artery without angina pectoris: Secondary | ICD-10-CM | POA: Diagnosis not present

## 2023-01-29 DIAGNOSIS — E039 Hypothyroidism, unspecified: Secondary | ICD-10-CM | POA: Diagnosis not present

## 2023-01-29 DIAGNOSIS — R55 Syncope and collapse: Secondary | ICD-10-CM | POA: Diagnosis not present

## 2023-01-29 DIAGNOSIS — N1832 Chronic kidney disease, stage 3b: Secondary | ICD-10-CM | POA: Diagnosis not present

## 2023-01-29 DIAGNOSIS — Z7901 Long term (current) use of anticoagulants: Secondary | ICD-10-CM | POA: Diagnosis not present

## 2023-01-29 NOTE — Telephone Encounter (Signed)
   Telephone encounter was:  successful.  01/29/2023 Name: Stephanie Atkinson MRN: [id number] DOB: [birth year]  Unsuccessful outbound call made today to assist with:  Food Insecurity  Outreach Attempt: Put in an additional [place] [facility]  [facility]  Population Health Careguide  Direct Dial: [phone] Website: [email or web address]

## 2023-01-31 DIAGNOSIS — I5022 Chronic systolic (congestive) heart failure: Secondary | ICD-10-CM | POA: Diagnosis not present

## 2023-01-31 DIAGNOSIS — I4821 Permanent atrial fibrillation: Secondary | ICD-10-CM | POA: Diagnosis not present

## 2023-01-31 DIAGNOSIS — R55 Syncope and collapse: Secondary | ICD-10-CM | POA: Diagnosis not present

## 2023-01-31 DIAGNOSIS — E039 Hypothyroidism, unspecified: Secondary | ICD-10-CM | POA: Diagnosis not present

## 2023-01-31 DIAGNOSIS — I2510 Atherosclerotic heart disease of native coronary artery without angina pectoris: Secondary | ICD-10-CM | POA: Diagnosis not present

## 2023-01-31 DIAGNOSIS — N1832 Chronic kidney disease, stage 3b: Secondary | ICD-10-CM | POA: Diagnosis not present

## 2023-01-31 DIAGNOSIS — Z7901 Long term (current) use of anticoagulants: Secondary | ICD-10-CM | POA: Diagnosis not present

## 2023-01-31 DIAGNOSIS — I130 Hypertensive heart and chronic kidney disease with heart failure and stage 1 through stage 4 chronic kidney disease, or unspecified chronic kidney disease: Secondary | ICD-10-CM | POA: Diagnosis not present

## 2023-02-04 DIAGNOSIS — I5022 Chronic systolic (congestive) heart failure: Secondary | ICD-10-CM | POA: Diagnosis not present

## 2023-02-04 DIAGNOSIS — E039 Hypothyroidism, unspecified: Secondary | ICD-10-CM | POA: Diagnosis not present

## 2023-02-04 DIAGNOSIS — N1832 Chronic kidney disease, stage 3b: Secondary | ICD-10-CM | POA: Diagnosis not present

## 2023-02-04 DIAGNOSIS — Z7901 Long term (current) use of anticoagulants: Secondary | ICD-10-CM | POA: Diagnosis not present

## 2023-02-04 DIAGNOSIS — I4821 Permanent atrial fibrillation: Secondary | ICD-10-CM | POA: Diagnosis not present

## 2023-02-04 DIAGNOSIS — I2510 Atherosclerotic heart disease of native coronary artery without angina pectoris: Secondary | ICD-10-CM | POA: Diagnosis not present

## 2023-02-04 DIAGNOSIS — R55 Syncope and collapse: Secondary | ICD-10-CM | POA: Diagnosis not present

## 2023-02-04 DIAGNOSIS — I130 Hypertensive heart and chronic kidney disease with heart failure and stage 1 through stage 4 chronic kidney disease, or unspecified chronic kidney disease: Secondary | ICD-10-CM | POA: Diagnosis not present

## 2023-02-05 DIAGNOSIS — E039 Hypothyroidism, unspecified: Secondary | ICD-10-CM | POA: Diagnosis not present

## 2023-02-05 DIAGNOSIS — I2510 Atherosclerotic heart disease of native coronary artery without angina pectoris: Secondary | ICD-10-CM | POA: Diagnosis not present

## 2023-02-05 DIAGNOSIS — Z7901 Long term (current) use of anticoagulants: Secondary | ICD-10-CM | POA: Diagnosis not present

## 2023-02-05 DIAGNOSIS — I5022 Chronic systolic (congestive) heart failure: Secondary | ICD-10-CM | POA: Diagnosis not present

## 2023-02-05 DIAGNOSIS — I130 Hypertensive heart and chronic kidney disease with heart failure and stage 1 through stage 4 chronic kidney disease, or unspecified chronic kidney disease: Secondary | ICD-10-CM | POA: Diagnosis not present

## 2023-02-05 DIAGNOSIS — N1832 Chronic kidney disease, stage 3b: Secondary | ICD-10-CM | POA: Diagnosis not present

## 2023-02-05 DIAGNOSIS — I4821 Permanent atrial fibrillation: Secondary | ICD-10-CM | POA: Diagnosis not present

## 2023-02-05 DIAGNOSIS — R55 Syncope and collapse: Secondary | ICD-10-CM | POA: Diagnosis not present

## 2023-02-06 ENCOUNTER — Encounter: Payer: Self-pay | Admitting: Cardiovascular Disease

## 2023-02-06 ENCOUNTER — Ambulatory Visit: Payer: Medicare PPO | Attending: Cardiovascular Disease | Admitting: Cardiovascular Disease

## 2023-02-06 VITALS — BP 120/54 | HR 71 | Ht 62.0 in | Wt 132.0 lb

## 2023-02-06 DIAGNOSIS — I4821 Permanent atrial fibrillation: Secondary | ICD-10-CM

## 2023-02-06 DIAGNOSIS — D6869 Other thrombophilia: Secondary | ICD-10-CM

## 2023-02-06 DIAGNOSIS — I25810 Atherosclerosis of coronary artery bypass graft(s) without angina pectoris: Secondary | ICD-10-CM

## 2023-02-06 DIAGNOSIS — Z950 Presence of cardiac pacemaker: Secondary | ICD-10-CM

## 2023-02-06 DIAGNOSIS — E7800 Pure hypercholesterolemia, unspecified: Secondary | ICD-10-CM

## 2023-02-06 DIAGNOSIS — I5022 Chronic systolic (congestive) heart failure: Secondary | ICD-10-CM | POA: Diagnosis not present

## 2023-02-06 DIAGNOSIS — I10 Essential (primary) hypertension: Secondary | ICD-10-CM

## 2023-02-06 DIAGNOSIS — G8929 Other chronic pain: Secondary | ICD-10-CM | POA: Diagnosis not present

## 2023-02-06 DIAGNOSIS — M5416 Radiculopathy, lumbar region: Secondary | ICD-10-CM | POA: Diagnosis not present

## 2023-02-06 MED ORDER — ENTRESTO 24-26 MG PO TABS: 1.0000 | ORAL_TABLET | Freq: Two times a day (BID) | ORAL | 11 refills | Status: DC

## 2023-02-06 NOTE — Patient Instructions (Signed)
Medication Instructions:  Decrease Entresto to 24-26 mg twice a day *If you need a refill on your cardiac medications before your next appointment, please call your pharmacy*  Follow-Up: At [facility], you and your health needs are our priority.  As part of our continuing mission to provide you with exceptional heart care, we have created designated Provider Care Teams.  These Care Teams include your primary Cardiologist (physician) and Advanced Practice Providers (APPs -  Physician Assistants and Nurse Practitioners) who all work together to provide you with the care you need, when you need it.  We recommend signing up for the patient portal called "MyChart".  Sign up information is provided on this After Visit Summary.  MyChart is used to connect with patients for Virtual Visits (Telemedicine).  Patients are able to view lab/test results, encounter notes, upcoming appointments, etc.  Non-urgent messages can be sent to your provider as well.   To learn more about what you can do with MyChart, go to [email or web address].    Your next appointment:   APP- 2 months  Dr Royann Shivers 6 months

## 2023-02-06 NOTE — Progress Notes (Signed)
Cardiology Office Note:    Date:  02/06/2023   ID:  Stephanie Atkinson, DOB [birth year], MRN [id number]  PCP:  Linus Galas, NP   [facility]  Cardiologist:  Nanetta Batty, MD Glendora Clouatre(pacemaker) Advanced Practice Provider:  No care team member to display Electrophysiologist:  None       Referring MD: Linus Galas, NP   Chief Complaint  Patient presents with   Pacemaker Check   Atrial Fibrillation   Congestive Heart Failure   Coronary Artery Disease    History of Present Illness:    Stephanie Atkinson is a 89 y.o. female with a hx of CAD s/p CABG, permanent atrial fibrillation with slow ventricular response and a history of syncope followed by single-chamber permanent pacemaker (Medtronic Azure XT SR implanted May 2019).  She developed heart failure with reduced left ventricular systolic function in October 2023.  After starting treatment with Entresto, carvedilol, Jardiance, spironolactone her symptoms of heart failure improved.  Her echocardiogram, most recently performed 01/09/2023 has only shown minimal improvement in LVEF up to 35-40%.  She has normal RV function and severe biatrial dilation, no serious valvular abnormalities.  She has been hospitalized twice in the last month or so.  On November 13 she presented with dizziness and near syncope and was felt to be hypovolemic.  Her dose of furosemide was reduced and Jardiance was stopped.  A week later she was hospitalized again on November 21 with altered mental status and was felt to have hypercalcemia related encephalopathy.  During the first admission her BNP was 273 (in 2023 her BNP was 184).  During the second admission her BNP was up to 499.  She has significant problems with orthostatic dizziness and her diastolic blood pressure today is quite low at 54, even though her weight is essentially unchanged at 132 pounds.  She states that at home her weight is almost always exactly 130 pounds.  She has not had  full-blown syncope and denies chest pain or focal neurological complaints.  She has not had any falls or bleeding problems on Eliquis.  Her biggest complaint is pain in her left buttock radiating down the posterior thigh all the way to her foot, strongly suggestive of sciatica.  She is going to see a pain specialist later today.  Her most recent creatinine on 01/27/2023 was 1.12.  She has normal hemoglobin.  Glycemic control is good with a hemoglobin A1c of 6.1% and her most recent lipid profile was excellent with an LDL cholesterol of 65, HDL 48 and normal triglycerides.  She has normal thyroid function.  Underlying rhythm is atrial fibrillation slow ventricular response at 40 bpm.  Her pacemaker is a Medtronic as her XT SR device with a 5076-lead and is MRI conditional.  Has 99% ventricular pacing.  All lead parameters are normal.  Activity has recently declined to less than an hour a day.  Her paced QRS complex is quite broad measured at 155 ms on the ECG from 01/16/2023.  Past Medical History:  Diagnosis Date   Chronic kidney disease, stage 3 (HCC)    Coronary artery disease    Hx of CABG 2006   LIMA-LAD, free RIMA-PDA, Radial-OM1-OM2   Hyperlipidemia    Hypertension    Hypothyroid    MI, old        PAF (paroxysmal atrial fibrillation) (HCC)    was on amio, d/c'd due to prolonged PR interval, rate control   Presence of permanent cardiac pacemaker 07/16/2017  Presence of stent in right coronary artery 07/12   RIMA-PDA occluded, DES stent placed   Syncope 07/12   bradycardic, beta blocker decreased, also felt to be dehydrated    Past Surgical History:  Procedure Laterality Date   CARDIAC CATHETERIZATION  101512   dr. Onalee Hua harding, revealing a atretic bypass to her right with patent distal RCA stent   CAROTID STENT  2012   CORONARY ARTERY BYPASS GRAFT  2006   LIMA-LAD, free RIMA-PDA, Radial-OM1-OM2   DOPPLER ECHOCARDIOGRAPHY  161096   mild asymmetric left ventricular  hypertrophy, left ventricular systolic function is low normal, ejection fraction = 50-55%, the LA is moderate dilated, the RA is mildly dilated, no significant valvular disease   INSERT / REPLACE / REMOVE PACEMAKER  07/16/2017   JOINT REPLACEMENT     hip replacement   LEFT HEART CATHETERIZATION WITH CORONARY/GRAFT ANGIOGRAM N/A 10/12/2012   Procedure: LEFT HEART CATHETERIZATION WITH Isabel Caprice;  Surgeon: Marykay Lex, MD;  Location[facility] C[facility] LAB;  Service: Cardiovascular;  Laterality: N/A;   LOOP RECORDER IMPLANT N/A 10/15/2012   Procedure: LOOP RECORDER IMPLANT;  Surgeon: Thurmon Fair, MD;  Location: [facility] CATH LAB;  Service: Cardiovascular;  Laterality: N/A;   LOOP RECORDER REMOVAL  07/16/2017   LOOP RECORDER REMOVAL N/A 07/16/2017   Procedure: LOOP RECORDER REMOVAL;  Surgeon: Thurmon Fair, MD;  Location: [facility] INVASIVE CV LAB;  Service: Cardiovascular;  Laterality: N/A;   NM MYOVIEW LTD  100510   post stress left ventricle is normal in size, post stress ejection fraction is 67% global left ventricular systolic function is normal, normal myocardial perfusion study, abnormal myocardial perfusion study, low risk scan   PACEMAKER IMPLANT N/A 07/16/2017   Procedure: PACEMAKER IMPLANT;  Surgeon: Thurmon Fair, MD;  Location: [facility] INVASIVE CV LAB;  Service: Cardiovascular;  Laterality: N/A;   THYROIDECTOMY      Current Medications: Current Meds  Medication Sig   acetaminophen (TYLENOL) 500 MG tablet Take 1 tablet (500 mg total) by mouth every 6 (six) hours as needed for moderate pain. (Patient taking differently: Take 1,000 mg by mouth 3 (three) times daily.)   atorvastatin (LIPITOR) 80 MG tablet Take 80 mg by mouth at bedtime.    carvedilol (COREG) 3.125 MG tablet Take 1 tablet (3.125 mg total) by mouth 2 (two) times daily.   ELIQUIS 2.5 MG TABS tablet TAKE 1 TABLET BY MOUTH TWICE A DAY   furosemide (LASIX) 20 MG tablet Take 1 tablet (20 mg total) by mouth daily. Take two tablets in  case of weight gain 2 to 3 lbs in 24 hrs or 5 lbs in 7 days, until weight back to baseline.   levothyroxine (SYNTHROID, LEVOTHROID) 50 MCG tablet Take 50 mcg by mouth daily before breakfast.    Polyethyl Glycol-Propyl Glycol (SYSTANE OP) Place 1 drop into both eyes daily as needed (dry eye).   sacubitril-valsartan (ENTRESTO) 24-26 MG Take 1 tablet by mouth 2 (two) times daily.   spironolactone (ALDACTONE) 25 MG tablet Take 1 tablet (25 mg total) by mouth daily.   traMADol (ULTRAM) 50 MG tablet Take 50 mg by mouth every 8 (eight) hours as needed.   VITAMIN D3 50 MCG (2000 UT) capsule Take 1 capsule by mouth once a week.   [DISCONTINUED] ENTRESTO 49-51 MG TAKE 1 TABLET BY MOUTH TWICE A DAY     Allergies:   Penicillins, Sulfa antibiotics, Gabapentin, and Lyrica [pregabalin]   Social History   Socioeconomic History   Marital status: Single  Spouse name: Not on file   Number of children: Not on file   Years of education: Not on file   Highest education level: Not on file  Occupational History   Not on file  Tobacco Use   Smoking status: Never   Smokeless tobacco: Current    Types: Snuff   Tobacco comments:    01/06/2023 patient still use Snuff  Vaping Use   Vaping status: Never Used  Substance and Sexual Activity   Alcohol use: No   Drug use: No   Sexual activity: Not on file  Other Topics Concern   Not on file  Social History Narrative   Not on file   Social Drivers of Health   Financial Resource Strain: Not on file  Food Insecurity: No Food Insecurity (01/16/2023)   Hunger Vital Sign    Worried About Running Out of Food in the Last Year: Never true    Ran Out of Food in the Last Year: Never true  Transportation Needs: No Transportation Needs (01/16/2023)   PRAPARE - Administrator, Civil Service (Medical): No    Lack of Transportation (Non-Medical): No  Physical Activity: Not on file  Stress: Not on file  Social Connections: Unknown (12/18/2021)    [facility]   Social Network    Social Network: Not on file     Family History: The patient's family history includes CAD in her maternal grandmother and mother.  ROS:   Please see the history of present illness.     All other systems reviewed and are negative.  EKGs/Labs/Other Studies Reviewed:    The following studies were reviewed today: Comprehensive pacemaker check in the office today.  EKG:  EKG from 01/16/2023 is personally reviewed and shows atrial fibrillation with 100% ventricular paced rhythm with a broad QRS complex at 155 ms.  EKG Interpretation Date/Time:    Ventricular Rate:    PR Interval:    QRS Duration:    QT Interval:    QTC Calculation:   R Axis:      Text Interpretation:          Recent Labs: 01/08/2023: TSH 0.943 01/16/2023: ALT 26; B Natriuretic Peptide 499.1 01/17/2023: Hemoglobin 14.1; Magnesium 2.5; Platelets 185 01/27/2023: BUN 12; Creatinine, Ser 1.12; Potassium 4.3; Sodium 142  Recent Lipid Panel    Component Value Date/Time   CHOL 127 11/25/2016 1005   TRIG 117 11/25/2016 1005   HDL 46 11/25/2016 1005   CHOLHDL 2.8 11/25/2016 1005   CHOLHDL 2.6 01/05/2015 1118   VLDL 18 01/05/2015 1118   LDLCALC 58 11/25/2016 1005   07/08/2022 cholesterol 133, HDL 48, LDL 65, triglycerides 109  Risk Assessment/Calculations:    CHA2DS2-VASc Score = 6 This indicates a 9.7% annual risk of stroke. The patient's score is based upon: CHF History: Yes HTN History: Yes Diabetes History: No Stroke History: No Vascular Disease History: Yes Age Score: 2 Gender Score: 1      Physical Exam:    VS:  BP (!) 120/54 (BP Location: Left Arm, Patient Position: Sitting)   Pulse 71   Ht 5\' 2"  (1.575 m)   Wt 132 lb (59.9 kg)   SpO2 98%   BMI 24.14 kg/m     Wt Readings from Last 3 Encounters:  02/06/23 132 lb (59.9 kg)  01/16/23 132 lb 4.4 oz (60 kg)  01/15/23 131 lb (59.4 kg)      General: Alert, oriented x3, no  distress, appears  less energetic than in the past.  Healthy left subclavian pacemaker site. Head: no evidence of trauma, PERRL, EOMI, no exophtalmos or lid lag, no myxedema, no xanthelasma; normal ears, nose and oropharynx Neck: normal jugular venous pulsations and no hepatojugular reflux; brisk carotid pulses without delay and no carotid bruits Chest: clear to auscultation, no signs of consolidation by percussion or palpation, normal fremitus, symmetrical and full respiratory excursions Cardiovascular: normal position and quality of the apical impulse, regular rhythm, normal first and second heart sounds, no murmurs, rubs or gallops Abdomen: no tenderness or distention, no masses by palpation, no abnormal pulsatility or arterial bruits, normal bowel sounds, no hepatosplenomegaly Extremities: no clubbing, cyanosis or edema; 2+ radial, ulnar and brachial pulses bilaterally; 2+ right femoral, posterior tibial and dorsalis pedis pulses; 2+ left femoral, posterior tibial and dorsalis pedis pulses; no subclavian or femoral bruits Neurological: grossly nonfocal Psych: Normal mood and affect    ASSESSMENT:    1. Chronic systolic congestive heart failure (HCC)   2. Coronary artery disease involving coronary bypass graft of native heart without angina pectoris   3. Permanent atrial fibrillation (HCC)   4. Acquired thrombophilia (HCC)   5. Pacemaker   6. Essential hypertension   7. Hypercholesterolemia      PLAN:    In order of problems listed above:  CHF: Clinically she appears to be euvolemic.  Currently her functional status is limited more by her sciatica pain than by any cardiac symptoms.  Still appears to have NYHA functional class II status and does not have dyspnea at rest or orthopnea.  [place] Pepper has been stopped due to problems with hypovolemia, although her BNP was higher during the recent hospitalization.  She remains on carvedilol, Entresto, furosemide and spironolactone.  The  furosemide is useful for her recent problems with hypercalcemia and she should continue the spironolactone to avoid hypokalemia.  She has a low diastolic blood pressure today and has symptoms of orthostatic hypotension.  Will decrease the dose of Entresto. CAD s/p CABG: She is asymptomatic despite the fact that she is not taking any antianginals other than a tiny dose of carvedilol.  At age 83 were trying to avoid invasive procedures.  Had bypass surgery in 2006 and last cardiac catheterization in 2014, all conduits open.  On high-dose statin.  Not on aspirin due to full anticoagulation. AFib: Permanent arrhythmia.  Has chronic slow ventricular response and she required 100% ventricular pacing even before we started the carvedilol.  On anticoagulation. Anticoagulation: Eliquis dose adjusted for age and small body size.  Renal function has improved. Pacemaker: Normal device function.  100% ventricular pacing.  If EF has not improved substantially on the echocardiogram consider referral for CRT-P, even though she is elderly. HTN: Diastolic blood pressure is quite low and she has orthostatic hypotension.  Entresto dose was decreased. HLP: Lipid parameters are in target range on statin.  Hemoglobin A1c in the "prediabetes" range, despite the fact that she is lean. Sciatica: If an MRI is indicated, this can be performed with appropriate reprogramming with her MRI conditional device.        Medication Adjustments/Labs and Tests Ordered: Current medicines are reviewed at length with the patient today.  Concerns regarding medicines are outlined above.  No orders of the defined types were placed in this encounter.  Meds ordered this encounter  Medications   sacubitril-valsartan (ENTRESTO) 24-26 MG    Sig: Take 1 tablet by mouth 2 (two) times daily.    Dispense:  60 tablet  Refill:  11    Patient Instructions  Medication Instructions:  Decrease Entresto to 24-26 mg twice a day *If you need a  refill on your cardiac medications before your next appointment, please call your pharmacy*  Follow-Up: At [facility], you and your health needs are our priority.  As part of our continuing mission to provide you with exceptional heart care, we have created designated Provider Care Teams.  These Care Teams include your primary Cardiologist (physician) and Advanced Practice Providers (APPs -  Physician Assistants and Nurse Practitioners) who all work together to provide you with the care you need, when you need it.  We recommend signing up for the patient portal called "MyChart".  Sign up information is provided on this After Visit Summary.  MyChart is used to connect with patients for Virtual Visits (Telemedicine).  Patients are able to view lab/test results, encounter notes, upcoming appointments, etc.  Non-urgent messages can be sent to your provider as well.   To learn more about what you can do with MyChart, go to [email or web address].    Your next appointment:   APP- 2 months  Dr Royann Shivers 6 months   Signed, Thurmon Fair, MD  02/06/2023 4:33 PM    [facility]

## 2023-02-07 DIAGNOSIS — I130 Hypertensive heart and chronic kidney disease with heart failure and stage 1 through stage 4 chronic kidney disease, or unspecified chronic kidney disease: Secondary | ICD-10-CM | POA: Diagnosis not present

## 2023-02-07 DIAGNOSIS — N1832 Chronic kidney disease, stage 3b: Secondary | ICD-10-CM | POA: Diagnosis not present

## 2023-02-07 DIAGNOSIS — Z7901 Long term (current) use of anticoagulants: Secondary | ICD-10-CM | POA: Diagnosis not present

## 2023-02-07 DIAGNOSIS — I4821 Permanent atrial fibrillation: Secondary | ICD-10-CM | POA: Diagnosis not present

## 2023-02-07 DIAGNOSIS — R55 Syncope and collapse: Secondary | ICD-10-CM | POA: Diagnosis not present

## 2023-02-07 DIAGNOSIS — I2510 Atherosclerotic heart disease of native coronary artery without angina pectoris: Secondary | ICD-10-CM | POA: Diagnosis not present

## 2023-02-07 DIAGNOSIS — E039 Hypothyroidism, unspecified: Secondary | ICD-10-CM | POA: Diagnosis not present

## 2023-02-07 DIAGNOSIS — I5022 Chronic systolic (congestive) heart failure: Secondary | ICD-10-CM | POA: Diagnosis not present

## 2023-02-10 ENCOUNTER — Telehealth: Payer: Self-pay | Admitting: Cardiovascular Disease

## 2023-02-10 MED ORDER — FUROSEMIDE 20 MG PO TABS: 20.0000 mg | ORAL_TABLET | Freq: Every day | ORAL | 3 refills | Status: DC

## 2023-02-10 NOTE — Telephone Encounter (Signed)
Pt's medication was sent to pt's pharmacy as requested. Confirmation received.  °

## 2023-02-10 NOTE — Telephone Encounter (Signed)
*  STAT* If patient is at the pharmacy, call can be transferred to refill team.   1. Which medications need to be refilled? (please list name of each medication and dose if known)  furosemide (LASIX) 20 MG tablet  2. Which pharmacy/location (including street and city if local pharmacy) is medication to be sent to? [id number] [place], [place] - [address] Phone: [phone]  Fax: [phone]     3. Do they need a 30 day or 90 day supply? 90

## 2023-02-11 DIAGNOSIS — I5022 Chronic systolic (congestive) heart failure: Secondary | ICD-10-CM | POA: Diagnosis not present

## 2023-02-11 DIAGNOSIS — R55 Syncope and collapse: Secondary | ICD-10-CM | POA: Diagnosis not present

## 2023-02-11 DIAGNOSIS — I2510 Atherosclerotic heart disease of native coronary artery without angina pectoris: Secondary | ICD-10-CM | POA: Diagnosis not present

## 2023-02-11 DIAGNOSIS — Z7901 Long term (current) use of anticoagulants: Secondary | ICD-10-CM | POA: Diagnosis not present

## 2023-02-11 DIAGNOSIS — E039 Hypothyroidism, unspecified: Secondary | ICD-10-CM | POA: Diagnosis not present

## 2023-02-11 DIAGNOSIS — N1832 Chronic kidney disease, stage 3b: Secondary | ICD-10-CM | POA: Diagnosis not present

## 2023-02-11 DIAGNOSIS — I130 Hypertensive heart and chronic kidney disease with heart failure and stage 1 through stage 4 chronic kidney disease, or unspecified chronic kidney disease: Secondary | ICD-10-CM | POA: Diagnosis not present

## 2023-02-11 DIAGNOSIS — I4821 Permanent atrial fibrillation: Secondary | ICD-10-CM | POA: Diagnosis not present

## 2023-02-12 DIAGNOSIS — R55 Syncope and collapse: Secondary | ICD-10-CM | POA: Diagnosis not present

## 2023-02-12 DIAGNOSIS — I2510 Atherosclerotic heart disease of native coronary artery without angina pectoris: Secondary | ICD-10-CM | POA: Diagnosis not present

## 2023-02-12 DIAGNOSIS — I5022 Chronic systolic (congestive) heart failure: Secondary | ICD-10-CM | POA: Diagnosis not present

## 2023-02-12 DIAGNOSIS — E039 Hypothyroidism, unspecified: Secondary | ICD-10-CM | POA: Diagnosis not present

## 2023-02-12 DIAGNOSIS — Z7901 Long term (current) use of anticoagulants: Secondary | ICD-10-CM | POA: Diagnosis not present

## 2023-02-12 DIAGNOSIS — I130 Hypertensive heart and chronic kidney disease with heart failure and stage 1 through stage 4 chronic kidney disease, or unspecified chronic kidney disease: Secondary | ICD-10-CM | POA: Diagnosis not present

## 2023-02-12 DIAGNOSIS — I4821 Permanent atrial fibrillation: Secondary | ICD-10-CM | POA: Diagnosis not present

## 2023-02-12 DIAGNOSIS — N1832 Chronic kidney disease, stage 3b: Secondary | ICD-10-CM | POA: Diagnosis not present

## 2023-02-13 ENCOUNTER — Encounter: Payer: Medicare PPO | Admitting: Cardiovascular Disease

## 2023-02-17 DIAGNOSIS — I130 Hypertensive heart and chronic kidney disease with heart failure and stage 1 through stage 4 chronic kidney disease, or unspecified chronic kidney disease: Secondary | ICD-10-CM | POA: Diagnosis not present

## 2023-02-17 DIAGNOSIS — N1832 Chronic kidney disease, stage 3b: Secondary | ICD-10-CM | POA: Diagnosis not present

## 2023-02-17 DIAGNOSIS — E039 Hypothyroidism, unspecified: Secondary | ICD-10-CM | POA: Diagnosis not present

## 2023-02-17 DIAGNOSIS — I4821 Permanent atrial fibrillation: Secondary | ICD-10-CM | POA: Diagnosis not present

## 2023-02-17 DIAGNOSIS — I2510 Atherosclerotic heart disease of native coronary artery without angina pectoris: Secondary | ICD-10-CM | POA: Diagnosis not present

## 2023-02-17 DIAGNOSIS — I5022 Chronic systolic (congestive) heart failure: Secondary | ICD-10-CM | POA: Diagnosis not present

## 2023-02-17 DIAGNOSIS — Z7901 Long term (current) use of anticoagulants: Secondary | ICD-10-CM | POA: Diagnosis not present

## 2023-02-17 DIAGNOSIS — R55 Syncope and collapse: Secondary | ICD-10-CM | POA: Diagnosis not present

## 2023-02-18 DIAGNOSIS — N183 Chronic kidney disease, stage 3 unspecified: Secondary | ICD-10-CM | POA: Diagnosis not present

## 2023-02-18 DIAGNOSIS — G8929 Other chronic pain: Secondary | ICD-10-CM | POA: Diagnosis not present

## 2023-02-18 DIAGNOSIS — M5416 Radiculopathy, lumbar region: Secondary | ICD-10-CM | POA: Diagnosis not present

## 2023-02-20 ENCOUNTER — Emergency Department (HOSPITAL_COMMUNITY)
Admission: EM | Admit: 2023-02-20 | Discharge: 2023-02-20 | Disposition: A | Discharge status: Home / Self Care | Payer: Medicare PPO | Attending: Emergency Medicine | Admitting: Emergency Medicine

## 2023-02-20 ENCOUNTER — Encounter (HOSPITAL_COMMUNITY): Payer: Self-pay | Admitting: Emergency Medicine

## 2023-02-20 ENCOUNTER — Other Ambulatory Visit: Payer: Self-pay

## 2023-02-20 ENCOUNTER — Emergency Department (HOSPITAL_COMMUNITY): Payer: Medicare PPO

## 2023-02-20 DIAGNOSIS — K8689 Other specified diseases of pancreas: Secondary | ICD-10-CM | POA: Diagnosis not present

## 2023-02-20 DIAGNOSIS — R1084 Generalized abdominal pain: Secondary | ICD-10-CM | POA: Diagnosis not present

## 2023-02-20 DIAGNOSIS — M47816 Spondylosis without myelopathy or radiculopathy, lumbar region: Secondary | ICD-10-CM | POA: Diagnosis not present

## 2023-02-20 DIAGNOSIS — K838 Other specified diseases of biliary tract: Secondary | ICD-10-CM | POA: Diagnosis not present

## 2023-02-20 DIAGNOSIS — R197 Diarrhea, unspecified: Secondary | ICD-10-CM | POA: Diagnosis not present

## 2023-02-20 DIAGNOSIS — M545 Low back pain, unspecified: Secondary | ICD-10-CM | POA: Insufficient documentation

## 2023-02-20 DIAGNOSIS — M4316 Spondylolisthesis, lumbar region: Secondary | ICD-10-CM | POA: Diagnosis not present

## 2023-02-20 DIAGNOSIS — R350 Frequency of micturition: Secondary | ICD-10-CM | POA: Diagnosis not present

## 2023-02-20 DIAGNOSIS — R109 Unspecified abdominal pain: Secondary | ICD-10-CM | POA: Diagnosis not present

## 2023-02-20 DIAGNOSIS — Z79899 Other long term (current) drug therapy: Secondary | ICD-10-CM | POA: Diagnosis not present

## 2023-02-20 DIAGNOSIS — M5442 Lumbago with sciatica, left side: Secondary | ICD-10-CM | POA: Diagnosis not present

## 2023-02-20 DIAGNOSIS — K5730 Diverticulosis of large intestine without perforation or abscess without bleeding: Secondary | ICD-10-CM | POA: Diagnosis not present

## 2023-02-20 DIAGNOSIS — M5430 Sciatica, unspecified side: Secondary | ICD-10-CM

## 2023-02-20 DIAGNOSIS — Z7901 Long term (current) use of anticoagulants: Secondary | ICD-10-CM | POA: Insufficient documentation

## 2023-02-20 DIAGNOSIS — I10 Essential (primary) hypertension: Secondary | ICD-10-CM | POA: Diagnosis not present

## 2023-02-20 LAB — COMPREHENSIVE METABOLIC PANEL
ALT: 21 U/L (ref 0–44)
AST: 24 U/L (ref 15–41)
Albumin: 3.7 g/dL (ref 3.5–5.0)
Alkaline Phosphatase: 57 U/L (ref 38–126)
Anion gap: 6 (ref 5–15)
BUN: 17 mg/dL (ref 8–23)
CO2: 25 mmol/L (ref 22–32)
Calcium: 11 mg/dL — ABNORMAL HIGH (ref 8.9–10.3)
Chloride: 105 mmol/L (ref 98–111)
Creatinine, Ser: 1.12 mg/dL — ABNORMAL HIGH (ref 0.44–1.00)
GFR, Estimated: 45 mL/min — ABNORMAL LOW (ref >60)
Glucose, Bld: 101 mg/dL — ABNORMAL HIGH (ref 70–99)
Potassium: 4.2 mmol/L (ref 3.5–5.1)
Sodium: 136 mmol/L (ref 135–145)
Total Bilirubin: 1.2 mg/dL — ABNORMAL HIGH (ref <1.2)
Total Protein: 6.1 g/dL — ABNORMAL LOW (ref 6.5–8.1)

## 2023-02-20 LAB — URINALYSIS, ROUTINE W REFLEX MICROSCOPIC
Bilirubin Urine: NEGATIVE
Glucose, UA: NEGATIVE mg/dL
Ketones, ur: NEGATIVE mg/dL
Nitrite: NEGATIVE
Protein, ur: NEGATIVE mg/dL
Specific Gravity, Urine: 1.016 (ref 1.005–1.030)
pH: 8 (ref 5.0–8.0)

## 2023-02-20 LAB — CBC
HCT: 42.5 % (ref 36.0–46.0)
Hemoglobin: 13.7 g/dL (ref 12.0–15.0)
MCH: 29.1 pg (ref 26.0–34.0)
MCHC: 32.2 g/dL (ref 30.0–36.0)
MCV: 90.2 fL (ref 80.0–100.0)
Platelets: 163 10*3/uL (ref 150–400)
RBC: 4.71 MIL/uL (ref 3.87–5.11)
RDW: 13.1 % (ref 11.5–15.5)
WBC: 3.5 10*3/uL — ABNORMAL LOW (ref 4.0–10.5)
nRBC: 0 % (ref 0.0–0.2)

## 2023-02-20 LAB — C DIFFICILE QUICK SCREEN W PCR REFLEX
C Diff antigen: NEGATIVE
C Diff interpretation: NOT DETECTED
C Diff toxin: NEGATIVE

## 2023-02-20 LAB — LIPASE, BLOOD: Lipase: 35 U/L (ref 11–51)

## 2023-02-20 MED ORDER — MORPHINE SULFATE (PF) 2 MG/ML IV SOLN
2.0000 mg | Freq: Once | INTRAVENOUS | Status: AC
  Administered 2023-02-20: 2 mg via INTRAVENOUS
  Filled 2023-02-20: qty 1

## 2023-02-20 MED ORDER — IOHEXOL 350 MG/ML SOLN
59.0000 mL | Freq: Once | INTRAVENOUS | Status: AC | PRN
  Administered 2023-02-20: 59 mL via INTRAVENOUS

## 2023-02-20 NOTE — ED Provider Notes (Signed)
Patient was initially seen by Dr. Gwenlyn Fudge.  Please see his note.  Plan was to follow-up on the patient's urinalysis and stool studies.  His urinalysis is not suggestive of urinary tract infection.  The C. difficile study was negative.  Discussed findings with patient and family.  They are comfortable with discharge at this time  Evaluation and diagnostic testing in the emergency department does not suggest an emergent condition requiring admission or immediate intervention beyond what has been performed at this time.  The patient is safe for discharge and has been instructed to return immediately for worsening symptoms, change in symptoms or any other concerns.   Linwood Dibbles, MD 02/20/23 [phone]

## 2023-02-20 NOTE — ED Provider Notes (Signed)
[place] EMERGENCY DEPARTMENT AT [facility] Provider Note   CSN: [id number] Arrival date & time: 02/20/23  1005     History  Chief Complaint  Patient presents with   Sciatica, Diarrhea    Stephanie Atkinson is a 89 y.o. female.  HPI 89 year old female presents with diarrhea and back pain.  She has been dealing with left and then right sided back pain for a few weeks.  She states that she has been on multiple meds for pain and these have also contributed to some constipation.  Took some milk of magnesia 1 or 2 days ago and now is having diarrhea.  She does not think there is blood in the stools.  This morning she had a bowel movement on herself due to the diarrhea and is also having abdominal pain.  She states she has chronic frequent urination that is unchanged.  No vomiting or fever.  No weakness or numbness in her legs but is sore for her to walk because of her back.  Home Medications Prior to Admission medications   Medication Sig Start Date End Date Taking? Authorizing Provider  acetaminophen (TYLENOL) 500 MG tablet Take 1 tablet (500 mg total) by mouth every 6 (six) hours as needed for moderate pain. Patient taking differently: Take 1,000 mg by mouth 3 (three) times daily. 06/27/19   Briant Cedar, MD  atorvastatin (LIPITOR) 80 MG tablet Take 80 mg by mouth at bedtime.     [provider]  carvedilol (COREG) 3.125 MG tablet Take 1 tablet (3.125 mg total) by mouth 2 (two) times daily. 04/10/22   Runell Gess, MD  ELIQUIS 2.5 MG TABS tablet TAKE 1 TABLET BY MOUTH TWICE A DAY 09/26/22   Runell Gess, MD  furosemide (LASIX) 20 MG tablet Take 1 tablet (20 mg total) by mouth daily. Take two tablets in case of weight gain 2 to 3 lbs in 24 hrs or 5 lbs in 7 days, until weight back to baseline. 02/10/23   Croitoru, Mihai, MD  furosemide (LASIX) 40 MG tablet Take 40 mg by mouth daily. Patient not taking: Reported on 02/06/2023 01/23/23   [provider]   levothyroxine (SYNTHROID, LEVOTHROID) 50 MCG tablet Take 50 mcg by mouth daily before breakfast.     [provider]  Polyethyl Glycol-Propyl Glycol (SYSTANE OP) Place 1 drop into both eyes daily as needed (dry eye).    [provider]  sacubitril-valsartan (ENTRESTO) 24-26 MG Take 1 tablet by mouth 2 (two) times daily. 02/06/23   Croitoru, Mihai, MD  spironolactone (ALDACTONE) 25 MG tablet Take 1 tablet (25 mg total) by mouth daily. 01/22/23   Runell Gess, MD  traMADol (ULTRAM) 50 MG tablet Take 50 mg by mouth every 8 (eight) hours as needed. 01/27/23   [provider]  VITAMIN D3 50 MCG (2000 UT) capsule Take 1 capsule by mouth once a week. 02/03/22   [provider]      Allergies    Penicillins, Sulfa antibiotics, Gabapentin, and Lyrica [pregabalin]    Review of Systems   Review of Systems  Constitutional:  Negative for fever.  Gastrointestinal:  Positive for abdominal pain and diarrhea. Negative for blood in stool and vomiting.  Genitourinary:  Negative for dysuria.  Musculoskeletal:  Positive for back pain.  Neurological:  Negative for weakness and numbness.    Physical Exam Updated Vital Signs BP (!) 163/70   Pulse (!) 59   Temp (!) 97.5 F (36.4  C) (Oral)   Resp 17   Ht 5\' 2"  (1.575 m)   Wt 58.1 kg   SpO2 96%   BMI 23.41 kg/m  Physical Exam Vitals and nursing note reviewed.  Constitutional:      General: She is not in acute distress.    Appearance: She is well-developed. She is not ill-appearing or diaphoretic.  HENT:     Head: Normocephalic and atraumatic.  Cardiovascular:     Rate and Rhythm: Normal rate and regular rhythm.     Heart sounds: Normal heart sounds.  Pulmonary:     Effort: Pulmonary effort is normal.     Breath sounds: Normal breath sounds.  Abdominal:     Palpations: Abdomen is soft.     Tenderness: There is generalized abdominal tenderness.  Musculoskeletal:     Thoracic back: No tenderness.      Lumbar back: Tenderness present. No bony tenderness.  Skin:    General: Skin is warm and dry.  Neurological:     Mental Status: She is alert.     Comments: Equal and normal strength and ROM of bilateral lower extremities. Grossly normal sensation.     ED Results / Procedures / Treatments   Labs (all labs ordered are listed, but only abnormal results are displayed) Labs Reviewed  COMPREHENSIVE METABOLIC PANEL - Abnormal; Notable for the following components:      Result Value   Glucose, Bld 101 (*)    Creatinine, Ser 1.12 (*)    Calcium 11.0 (*)    Total Protein 6.1 (*)    Total Bilirubin 1.2 (*)    GFR, Estimated 45 (*)    All other components within normal limits  CBC - Abnormal; Notable for the following components:   WBC 3.5 (*)    All other components within normal limits  C DIFFICILE QUICK SCREEN W PCR REFLEX    GASTROINTESTINAL PANEL BY PCR, STOOL (REPLACES STOOL CULTURE)  LIPASE, BLOOD  URINALYSIS, ROUTINE W REFLEX MICROSCOPIC    EKG None  Radiology CT L-SPINE NO CHARGE Result Date: 02/20/2023 CLINICAL DATA:  Abdominal pain EXAM: CT LUMBAR SPINE WITHOUT CONTRAST TECHNIQUE: Multidetector CT imaging of the lumbar spine was performed without intravenous contrast administration. Multiplanar CT image reconstructions were also generated. RADIATION DOSE REDUCTION: This exam was performed according to the departmental dose-optimization program which includes automated exposure control, adjustment of the mA and/or kV according to patient size and/or use of iterative reconstruction technique. COMPARISON:  CT 08/18/2022 FINDINGS: Segmentation: 5 lumbar type vertebrae. Rudimentary ribs at the T12 segment. Alignment: Grade 1 anterolisthesis at L4-5.  No traumatic listhesis. Vertebrae: No acute fracture or focal pathologic process. Paraspinal and other soft tissues: See dedicated CT abdomen-pelvis report for full detail. Disc levels: Facet predominant degenerative spondylosis, not  appreciably changed compared to the recent prior. IMPRESSION: No acute fracture or traumatic listhesis of the lumbar spine. Electronically Signed   By: Duanne Guess D.O.   On: 02/20/2023 12:53   CT ABDOMEN PELVIS W CONTRAST Result Date: 02/20/2023 CLINICAL DATA:  Left lower quadrant abdominal pain EXAM: CT ABDOMEN AND PELVIS WITH CONTRAST TECHNIQUE: Multidetector CT imaging of the abdomen and pelvis was performed using the standard protocol following bolus administration of intravenous contrast. RADIATION DOSE REDUCTION: This exam was performed according to the departmental dose-optimization program which includes automated exposure control, adjustment of the mA and/or kV according to patient size and/or use of iterative reconstruction technique. CONTRAST:  59mL OMNIPAQUE IOHEXOL 350 MG/ML SOLN COMPARISON:  CT abdomen  and pelvis dated 03/12/2022 FINDINGS: Lower chest: Partially imaged pacemaker lead terminates in the right ventricle. No focal consolidation or pulmonary nodule in the lung bases. No pleural effusion or pneumothorax demonstrated. Multichamber cardiomegaly. Hepatobiliary: No focal hepatic lesions. Mild intrahepatic bile duct dilation, likely secondary to cholecystectomy. Pancreas: Coarse calcifications in the pancreatic head, likely sequela of prior pancreatitis. No focal lesions or main ductal dilation. Mild heterogeneity of the pancreatic tail is likely secondary to beam hardening artifact from the patient's overlying hand and tubing material. Spleen: Normal in size without focal abnormality. Adrenals/Urinary Tract: No adrenal nodules. No suspicious renal mass, calculi or hydronephrosis. Suboptimal evaluation of the bladder due to beam hardening artifact from hip arthroplasty. Stomach/Bowel: Normal appearance of the stomach. No evidence of bowel wall thickening, distention, or inflammatory changes. Colonic diverticulosis without acute diverticulitis. Appendix is not discretely seen.  Vascular/Lymphatic: Aortic atherosclerosis. No enlarged abdominal or pelvic lymph nodes. Reproductive: No adnexal masses. Other: Small volume free fluid.  No free air or fluid collection. Musculoskeletal: No acute or abnormal lytic or blastic osseous lesions. Multilevel degenerative changes of the partially imaged thoracic and lumbar spine. Postsurgical changes of right hip arthroplasty. IMPRESSION: 1. No acute abdominopelvic findings. 2. Colonic diverticulosis without acute diverticulitis. 3. Small volume free fluid, indeterminate. 4. Multichamber cardiomegaly. 5.  Aortic Atherosclerosis (ICD10-I70.0). Electronically Signed   By: Agustin Cree M.D.   On: 02/20/2023 12:51    Procedures Procedures    Medications Ordered in ED Medications  morphine (PF) 2 MG/ML injection 2 mg (2 mg Intravenous Given 02/20/23 1120)  iohexol (OMNIPAQUE) 350 MG/ML injection 59 mL (59 mLs Intravenous Contrast Given 02/20/23 1225)  morphine (PF) 2 MG/ML injection 2 mg (2 mg Intravenous Given 02/20/23 1342)    ED Course/ Medical Decision Making/ A&P                                 Medical Decision Making Amount and/or Complexity of Data Reviewed Labs: ordered.    Details: Renal function baseline.  Chronic mild hypercalcemia. Radiology: ordered and independent interpretation performed.    Details: No obstruction  Risk Prescription drug management.   Patient presents with diarrhea and back pain.  C. difficile testing is negative.  She is having some urinary frequency and some abdominal pain so a UA is currently pending.  Otherwise pain control is doing better in this seems to be a subacute to chronic back pain based on chart review.  She is on hydrocodone at home after further discussion with her and I do not know that making any pertinent adjustments would be helpful.  She feels comfortable enough to go home, likely discharge after urine.  Care transferred to Dr. Lynelle Doctor.        Final Clinical Impression(s) /  ED Diagnoses Final diagnoses:  None    Rx / DC Orders ED Discharge Orders     None         Pricilla Loveless, MD 02/20/23 1553

## 2023-02-20 NOTE — ED Triage Notes (Addendum)
Pt BIB [facility] from home for sciatic pain x 3 weeks.  Pt was taking tramadol but is now taking hydrocodone. Pt complains of 10/10 pain to her lower back, butt, both legs and right knee. Pt took stool softener and milk of mag yesterday and has been having diarrhea this AM.  2 BM's so far.   174/60  other VSS.

## 2023-02-20 NOTE — Discharge Instructions (Signed)
Continue your current medications.  Follow-up with your primary care doctor to be rechecked. 

## 2023-02-20 NOTE — ED Notes (Signed)
Patient transported to CT 

## 2023-02-20 NOTE — ED Notes (Signed)
Pt unable to provide a urine sample at this time; pt given water to drink per Dr. Criss Alvine

## 2023-02-21 DIAGNOSIS — I4821 Permanent atrial fibrillation: Secondary | ICD-10-CM | POA: Diagnosis not present

## 2023-02-21 DIAGNOSIS — N1832 Chronic kidney disease, stage 3b: Secondary | ICD-10-CM | POA: Diagnosis not present

## 2023-02-21 DIAGNOSIS — E039 Hypothyroidism, unspecified: Secondary | ICD-10-CM | POA: Diagnosis not present

## 2023-02-21 DIAGNOSIS — I5022 Chronic systolic (congestive) heart failure: Secondary | ICD-10-CM | POA: Diagnosis not present

## 2023-02-21 DIAGNOSIS — Z7901 Long term (current) use of anticoagulants: Secondary | ICD-10-CM | POA: Diagnosis not present

## 2023-02-21 DIAGNOSIS — I2510 Atherosclerotic heart disease of native coronary artery without angina pectoris: Secondary | ICD-10-CM | POA: Diagnosis not present

## 2023-02-21 DIAGNOSIS — R55 Syncope and collapse: Secondary | ICD-10-CM | POA: Diagnosis not present

## 2023-02-21 DIAGNOSIS — I130 Hypertensive heart and chronic kidney disease with heart failure and stage 1 through stage 4 chronic kidney disease, or unspecified chronic kidney disease: Secondary | ICD-10-CM | POA: Diagnosis not present

## 2023-02-25 DIAGNOSIS — M5416 Radiculopathy, lumbar region: Secondary | ICD-10-CM | POA: Diagnosis not present

## 2023-02-27 DIAGNOSIS — I130 Hypertensive heart and chronic kidney disease with heart failure and stage 1 through stage 4 chronic kidney disease, or unspecified chronic kidney disease: Secondary | ICD-10-CM | POA: Diagnosis not present

## 2023-02-27 DIAGNOSIS — Z7901 Long term (current) use of anticoagulants: Secondary | ICD-10-CM | POA: Diagnosis not present

## 2023-02-27 DIAGNOSIS — N1832 Chronic kidney disease, stage 3b: Secondary | ICD-10-CM | POA: Diagnosis not present

## 2023-02-27 DIAGNOSIS — I4821 Permanent atrial fibrillation: Secondary | ICD-10-CM | POA: Diagnosis not present

## 2023-02-27 DIAGNOSIS — E039 Hypothyroidism, unspecified: Secondary | ICD-10-CM | POA: Diagnosis not present

## 2023-02-27 DIAGNOSIS — I5022 Chronic systolic (congestive) heart failure: Secondary | ICD-10-CM | POA: Diagnosis not present

## 2023-02-27 DIAGNOSIS — I2510 Atherosclerotic heart disease of native coronary artery without angina pectoris: Secondary | ICD-10-CM | POA: Diagnosis not present

## 2023-02-27 DIAGNOSIS — R55 Syncope and collapse: Secondary | ICD-10-CM | POA: Diagnosis not present

## 2023-02-28 DIAGNOSIS — N1832 Chronic kidney disease, stage 3b: Secondary | ICD-10-CM | POA: Diagnosis not present

## 2023-02-28 DIAGNOSIS — R55 Syncope and collapse: Secondary | ICD-10-CM | POA: Diagnosis not present

## 2023-02-28 DIAGNOSIS — I2510 Atherosclerotic heart disease of native coronary artery without angina pectoris: Secondary | ICD-10-CM | POA: Diagnosis not present

## 2023-02-28 DIAGNOSIS — I5022 Chronic systolic (congestive) heart failure: Secondary | ICD-10-CM | POA: Diagnosis not present

## 2023-02-28 DIAGNOSIS — I4821 Permanent atrial fibrillation: Secondary | ICD-10-CM | POA: Diagnosis not present

## 2023-02-28 DIAGNOSIS — Z7901 Long term (current) use of anticoagulants: Secondary | ICD-10-CM | POA: Diagnosis not present

## 2023-02-28 DIAGNOSIS — I130 Hypertensive heart and chronic kidney disease with heart failure and stage 1 through stage 4 chronic kidney disease, or unspecified chronic kidney disease: Secondary | ICD-10-CM | POA: Diagnosis not present

## 2023-02-28 DIAGNOSIS — E039 Hypothyroidism, unspecified: Secondary | ICD-10-CM | POA: Diagnosis not present

## 2023-03-04 ENCOUNTER — Telehealth: Payer: Self-pay

## 2023-03-04 NOTE — Progress Notes (Signed)
 Transition Care Management Follow-up Telephone Call Date of discharge and from where: 02/20/2023 [facility] How have you been since you were released from the hospital? Per patient's daughter her pain level is the same. Any questions or concerns? No  Items Reviewed: Did the pt receive and understand the discharge instructions provided? Yes  Medications obtained and verified? Yes  Other?  Emailed caregiver support to patient's daughter. Letter saved in Epic. Any new allergies since your discharge? No  Dietary orders reviewed? Yes Do you have support at home? Yes   Follow up appointments reviewed:  PCP Hospital f/u appt confirmed? No  Scheduled to see  on  @ . Specialist Hospital f/u appt confirmed?  Per patient's daughter she is being treated at a pain clinic.  Scheduled to see  on  @ . Are transportation arrangements needed? No  If their condition worsens, is the pt aware to call PCP or go to the Emergency Dept.? Yes Was the patient provided with contact information for the PCP's office or ED? Yes Was to pt encouraged to call back with questions or concerns? Yes   Antwine Agosto [facility]  [facility], [facility] Guide Direct Dial: [phone]  Website: [email or web address]

## 2023-03-04 NOTE — Progress Notes (Signed)
 Transition Care Management Unsuccessful Follow-up Telephone Call  Date of discharge and from where:  02/20/2023 [facility]  Attempts:  1st Attempt  Reason for unsuccessful TCM follow-up call:  No answer/busy  Audris Speaker [facility]  [facility], [facility] Resource Care Guide Direct Dial: [phone]  Website: [email or web address]

## 2023-03-06 DIAGNOSIS — R55 Syncope and collapse: Secondary | ICD-10-CM | POA: Diagnosis not present

## 2023-03-06 DIAGNOSIS — N1832 Chronic kidney disease, stage 3b: Secondary | ICD-10-CM | POA: Diagnosis not present

## 2023-03-06 DIAGNOSIS — Z7901 Long term (current) use of anticoagulants: Secondary | ICD-10-CM | POA: Diagnosis not present

## 2023-03-06 DIAGNOSIS — I4821 Permanent atrial fibrillation: Secondary | ICD-10-CM | POA: Diagnosis not present

## 2023-03-06 DIAGNOSIS — I2510 Atherosclerotic heart disease of native coronary artery without angina pectoris: Secondary | ICD-10-CM | POA: Diagnosis not present

## 2023-03-06 DIAGNOSIS — I5022 Chronic systolic (congestive) heart failure: Secondary | ICD-10-CM | POA: Diagnosis not present

## 2023-03-06 DIAGNOSIS — E039 Hypothyroidism, unspecified: Secondary | ICD-10-CM | POA: Diagnosis not present

## 2023-03-06 DIAGNOSIS — I130 Hypertensive heart and chronic kidney disease with heart failure and stage 1 through stage 4 chronic kidney disease, or unspecified chronic kidney disease: Secondary | ICD-10-CM | POA: Diagnosis not present

## 2023-03-07 DIAGNOSIS — R55 Syncope and collapse: Secondary | ICD-10-CM | POA: Diagnosis not present

## 2023-03-07 DIAGNOSIS — E039 Hypothyroidism, unspecified: Secondary | ICD-10-CM | POA: Diagnosis not present

## 2023-03-07 DIAGNOSIS — N1832 Chronic kidney disease, stage 3b: Secondary | ICD-10-CM | POA: Diagnosis not present

## 2023-03-07 DIAGNOSIS — I130 Hypertensive heart and chronic kidney disease with heart failure and stage 1 through stage 4 chronic kidney disease, or unspecified chronic kidney disease: Secondary | ICD-10-CM | POA: Diagnosis not present

## 2023-03-07 DIAGNOSIS — I2510 Atherosclerotic heart disease of native coronary artery without angina pectoris: Secondary | ICD-10-CM | POA: Diagnosis not present

## 2023-03-07 DIAGNOSIS — I4821 Permanent atrial fibrillation: Secondary | ICD-10-CM | POA: Diagnosis not present

## 2023-03-07 DIAGNOSIS — I5022 Chronic systolic (congestive) heart failure: Secondary | ICD-10-CM | POA: Diagnosis not present

## 2023-03-07 DIAGNOSIS — Z7901 Long term (current) use of anticoagulants: Secondary | ICD-10-CM | POA: Diagnosis not present

## 2023-03-08 ENCOUNTER — Emergency Department (HOSPITAL_COMMUNITY): Payer: Medicare PPO

## 2023-03-08 ENCOUNTER — Encounter (HOSPITAL_COMMUNITY): Payer: Self-pay

## 2023-03-08 ENCOUNTER — Inpatient Hospital Stay (HOSPITAL_COMMUNITY): Payer: Medicare PPO

## 2023-03-08 ENCOUNTER — Inpatient Hospital Stay (HOSPITAL_COMMUNITY)
Admission: EM | Admit: 2023-03-08 | Discharge: 2023-03-11 | DRG: 871 | Disposition: A | Discharge status: Home / Self Care | Payer: Medicare PPO | Attending: Internal Medicine | Admitting: Internal Medicine

## 2023-03-08 ENCOUNTER — Other Ambulatory Visit: Payer: Self-pay

## 2023-03-08 DIAGNOSIS — I5022 Chronic systolic (congestive) heart failure: Secondary | ICD-10-CM | POA: Diagnosis present

## 2023-03-08 DIAGNOSIS — N183 Chronic kidney disease, stage 3 unspecified: Secondary | ICD-10-CM | POA: Diagnosis not present

## 2023-03-08 DIAGNOSIS — Z7901 Long term (current) use of anticoagulants: Secondary | ICD-10-CM

## 2023-03-08 DIAGNOSIS — I130 Hypertensive heart and chronic kidney disease with heart failure and stage 1 through stage 4 chronic kidney disease, or unspecified chronic kidney disease: Secondary | ICD-10-CM | POA: Diagnosis present

## 2023-03-08 DIAGNOSIS — Z955 Presence of coronary angioplasty implant and graft: Secondary | ICD-10-CM | POA: Diagnosis not present

## 2023-03-08 DIAGNOSIS — Z66 Do not resuscitate: Secondary | ICD-10-CM | POA: Diagnosis present

## 2023-03-08 DIAGNOSIS — A419 Sepsis, unspecified organism: Principal | ICD-10-CM | POA: Diagnosis present

## 2023-03-08 DIAGNOSIS — R6521 Severe sepsis with septic shock: Secondary | ICD-10-CM | POA: Diagnosis not present

## 2023-03-08 DIAGNOSIS — E785 Hyperlipidemia, unspecified: Secondary | ICD-10-CM | POA: Diagnosis present

## 2023-03-08 DIAGNOSIS — Z1152 Encounter for screening for COVID-19: Secondary | ICD-10-CM | POA: Diagnosis not present

## 2023-03-08 DIAGNOSIS — Z8249 Family history of ischemic heart disease and other diseases of the circulatory system: Secondary | ICD-10-CM

## 2023-03-08 DIAGNOSIS — E890 Postprocedural hypothyroidism: Secondary | ICD-10-CM | POA: Diagnosis present

## 2023-03-08 DIAGNOSIS — E861 Hypovolemia: Secondary | ICD-10-CM | POA: Diagnosis present

## 2023-03-08 DIAGNOSIS — G4489 Other headache syndrome: Secondary | ICD-10-CM | POA: Diagnosis not present

## 2023-03-08 DIAGNOSIS — R918 Other nonspecific abnormal finding of lung field: Secondary | ICD-10-CM | POA: Diagnosis not present

## 2023-03-08 DIAGNOSIS — I480 Paroxysmal atrial fibrillation: Secondary | ICD-10-CM | POA: Diagnosis present

## 2023-03-08 DIAGNOSIS — I10 Essential (primary) hypertension: Secondary | ICD-10-CM | POA: Diagnosis not present

## 2023-03-08 DIAGNOSIS — N390 Urinary tract infection, site not specified: Secondary | ICD-10-CM

## 2023-03-08 DIAGNOSIS — I959 Hypotension, unspecified: Secondary | ICD-10-CM | POA: Diagnosis not present

## 2023-03-08 DIAGNOSIS — F17290 Nicotine dependence, other tobacco product, uncomplicated: Secondary | ICD-10-CM | POA: Diagnosis present

## 2023-03-08 DIAGNOSIS — R404 Transient alteration of awareness: Secondary | ICD-10-CM

## 2023-03-08 DIAGNOSIS — G928 Other toxic encephalopathy: Secondary | ICD-10-CM | POA: Diagnosis present

## 2023-03-08 DIAGNOSIS — R109 Unspecified abdominal pain: Secondary | ICD-10-CM | POA: Diagnosis not present

## 2023-03-08 DIAGNOSIS — H532 Diplopia: Secondary | ICD-10-CM | POA: Diagnosis not present

## 2023-03-08 DIAGNOSIS — R4182 Altered mental status, unspecified: Secondary | ICD-10-CM | POA: Diagnosis not present

## 2023-03-08 DIAGNOSIS — Z882 Allergy status to sulfonamides status: Secondary | ICD-10-CM

## 2023-03-08 DIAGNOSIS — E039 Hypothyroidism, unspecified: Secondary | ICD-10-CM | POA: Diagnosis present

## 2023-03-08 DIAGNOSIS — I517 Cardiomegaly: Secondary | ICD-10-CM | POA: Diagnosis not present

## 2023-03-08 DIAGNOSIS — Z96641 Presence of right artificial hip joint: Secondary | ICD-10-CM | POA: Diagnosis present

## 2023-03-08 DIAGNOSIS — I252 Old myocardial infarction: Secondary | ICD-10-CM | POA: Diagnosis not present

## 2023-03-08 DIAGNOSIS — Z880 Allergy status to penicillin: Secondary | ICD-10-CM

## 2023-03-08 DIAGNOSIS — R001 Bradycardia, unspecified: Secondary | ICD-10-CM | POA: Diagnosis present

## 2023-03-08 DIAGNOSIS — Z950 Presence of cardiac pacemaker: Secondary | ICD-10-CM

## 2023-03-08 DIAGNOSIS — I25810 Atherosclerosis of coronary artery bypass graft(s) without angina pectoris: Secondary | ICD-10-CM | POA: Diagnosis present

## 2023-03-08 DIAGNOSIS — Z888 Allergy status to other drugs, medicaments and biological substances status: Secondary | ICD-10-CM

## 2023-03-08 DIAGNOSIS — E872 Acidosis, unspecified: Secondary | ICD-10-CM | POA: Diagnosis not present

## 2023-03-08 DIAGNOSIS — N189 Chronic kidney disease, unspecified: Secondary | ICD-10-CM | POA: Diagnosis present

## 2023-03-08 DIAGNOSIS — N1831 Chronic kidney disease, stage 3a: Secondary | ICD-10-CM | POA: Diagnosis present

## 2023-03-08 DIAGNOSIS — I9589 Other hypotension: Secondary | ICD-10-CM | POA: Diagnosis present

## 2023-03-08 DIAGNOSIS — I255 Ischemic cardiomyopathy: Secondary | ICD-10-CM | POA: Diagnosis not present

## 2023-03-08 DIAGNOSIS — Z79899 Other long term (current) drug therapy: Secondary | ICD-10-CM

## 2023-03-08 DIAGNOSIS — N179 Acute kidney failure, unspecified: Secondary | ICD-10-CM | POA: Diagnosis not present

## 2023-03-08 DIAGNOSIS — J189 Pneumonia, unspecified organism: Principal | ICD-10-CM

## 2023-03-08 DIAGNOSIS — N2889 Other specified disorders of kidney and ureter: Secondary | ICD-10-CM | POA: Diagnosis not present

## 2023-03-08 DIAGNOSIS — R531 Weakness: Secondary | ICD-10-CM | POA: Diagnosis not present

## 2023-03-08 DIAGNOSIS — I2510 Atherosclerotic heart disease of native coronary artery without angina pectoris: Secondary | ICD-10-CM | POA: Diagnosis present

## 2023-03-08 DIAGNOSIS — I700 Atherosclerosis of aorta: Secondary | ICD-10-CM | POA: Diagnosis not present

## 2023-03-08 DIAGNOSIS — J168 Pneumonia due to other specified infectious organisms: Secondary | ICD-10-CM | POA: Diagnosis not present

## 2023-03-08 DIAGNOSIS — Z79890 Hormone replacement therapy (postmenopausal): Secondary | ICD-10-CM | POA: Diagnosis not present

## 2023-03-08 DIAGNOSIS — R059 Cough, unspecified: Secondary | ICD-10-CM | POA: Diagnosis not present

## 2023-03-08 HISTORY — DX: Personal history of urinary (tract) infections: Z87.440

## 2023-03-08 LAB — URINALYSIS, W/ REFLEX TO CULTURE (INFECTION SUSPECTED)
Bilirubin Urine: NEGATIVE
Glucose, UA: NEGATIVE mg/dL
Hgb urine dipstick: NEGATIVE
Ketones, ur: NEGATIVE mg/dL
Leukocytes,Ua: NEGATIVE
Nitrite: NEGATIVE
Protein, ur: NEGATIVE mg/dL
Specific Gravity, Urine: 1.009 (ref 1.005–1.030)
pH: 5 (ref 5.0–8.0)

## 2023-03-08 LAB — COMPREHENSIVE METABOLIC PANEL
ALT: 27 U/L (ref 0–44)
AST: 26 U/L (ref 15–41)
Albumin: 3.2 g/dL — ABNORMAL LOW (ref 3.5–5.0)
Alkaline Phosphatase: 53 U/L (ref 38–126)
Anion gap: 7 (ref 5–15)
BUN: 26 mg/dL — ABNORMAL HIGH (ref 8–23)
CO2: 21 mmol/L — ABNORMAL LOW (ref 22–32)
Calcium: 10.5 mg/dL — ABNORMAL HIGH (ref 8.9–10.3)
Chloride: 108 mmol/L (ref 98–111)
Creatinine, Ser: 1.63 mg/dL — ABNORMAL HIGH (ref 0.44–1.00)
GFR, Estimated: 29 mL/min — ABNORMAL LOW (ref >60)
Glucose, Bld: 112 mg/dL — ABNORMAL HIGH (ref 70–99)
Potassium: 4.5 mmol/L (ref 3.5–5.1)
Sodium: 136 mmol/L (ref 135–145)
Total Bilirubin: 1.9 mg/dL — ABNORMAL HIGH (ref 0.0–1.2)
Total Protein: 5.8 g/dL — ABNORMAL LOW (ref 6.5–8.1)

## 2023-03-08 LAB — CBC WITH DIFFERENTIAL/PLATELET
Abs Immature Granulocytes: 0.14 10*3/uL — ABNORMAL HIGH (ref 0.00–0.07)
Basophils Absolute: 0 10*3/uL (ref 0.0–0.1)
Basophils Relative: 0 %
Eosinophils Absolute: 0 10*3/uL (ref 0.0–0.5)
Eosinophils Relative: 0 %
HCT: 38.2 % (ref 36.0–46.0)
Hemoglobin: 12.2 g/dL (ref 12.0–15.0)
Immature Granulocytes: 1 %
Lymphocytes Relative: 3 %
Lymphs Abs: 0.6 10*3/uL — ABNORMAL LOW (ref 0.7–4.0)
MCH: 29.5 pg (ref 26.0–34.0)
MCHC: 31.9 g/dL (ref 30.0–36.0)
MCV: 92.3 fL (ref 80.0–100.0)
Monocytes Absolute: 0.8 10*3/uL (ref 0.1–1.0)
Monocytes Relative: 4 %
Neutro Abs: 18.8 10*3/uL — ABNORMAL HIGH (ref 1.7–7.7)
Neutrophils Relative %: 92 %
Platelets: 172 10*3/uL (ref 150–400)
RBC: 4.14 MIL/uL (ref 3.87–5.11)
RDW: 13.8 % (ref 11.5–15.5)
WBC: 20.3 10*3/uL — ABNORMAL HIGH (ref 4.0–10.5)
nRBC: 0 % (ref 0.0–0.2)

## 2023-03-08 LAB — I-STAT CG4 LACTIC ACID, ED
Lactic Acid, Venous: 1.6 mmol/L (ref 0.5–1.9)
Lactic Acid, Venous: 2.5 mmol/L (ref 0.5–1.9)

## 2023-03-08 LAB — RESP PANEL BY RT-PCR (RSV, FLU A&B, COVID)  RVPGX2
Influenza A by PCR: NEGATIVE
Influenza B by PCR: NEGATIVE
Resp Syncytial Virus by PCR: NEGATIVE
SARS Coronavirus 2 by RT PCR: NEGATIVE

## 2023-03-08 LAB — APTT: aPTT: 35 s (ref 24–36)

## 2023-03-08 LAB — PROTIME-INR
INR: 1.5 — ABNORMAL HIGH (ref 0.8–1.2)
Prothrombin Time: 18.2 s — ABNORMAL HIGH (ref 11.4–15.2)

## 2023-03-08 LAB — PROCALCITONIN: Procalcitonin: 1.11 ng/mL

## 2023-03-08 MED ORDER — ACETAMINOPHEN 325 MG PO TABS
650.0000 mg | ORAL_TABLET | Freq: Four times a day (QID) | ORAL | Status: DC | PRN
Start: 2023-03-08 — End: 2023-03-11
  Administered 2023-03-09 – 2023-03-11 (×5): 650 mg via ORAL
  Filled 2023-03-08 (×5): qty 2

## 2023-03-08 MED ORDER — SODIUM CHLORIDE 0.9 % IV BOLUS
1000.0000 mL | Freq: Once | INTRAVENOUS | Status: AC
  Administered 2023-03-08: 1000 mL via INTRAVENOUS

## 2023-03-08 MED ORDER — LEVOTHYROXINE SODIUM 50 MCG PO TABS
50.0000 ug | ORAL_TABLET | Freq: Every day | ORAL | Status: DC
  Administered 2023-03-09 – 2023-03-11 (×3): 50 ug via ORAL
  Filled 2023-03-08 (×2): qty 1

## 2023-03-08 MED ORDER — HYDROCODONE-ACETAMINOPHEN 5-325 MG PO TABS
1.0000 | ORAL_TABLET | ORAL | Status: DC
  Administered 2023-03-08 – 2023-03-09 (×2): 1 via ORAL
  Filled 2023-03-08 (×2): qty 1

## 2023-03-08 MED ORDER — ACETAMINOPHEN 650 MG RE SUPP: 650.0000 mg | Freq: Four times a day (QID) | RECTAL | Status: DC | PRN

## 2023-03-08 MED ORDER — SODIUM CHLORIDE 0.9 % IV SOLN: INTRAVENOUS | Status: AC

## 2023-03-08 MED ORDER — MORPHINE SULFATE (PF) 2 MG/ML IV SOLN: 2.0000 mg | INTRAVENOUS | Status: DC | PRN

## 2023-03-08 MED ORDER — ATORVASTATIN CALCIUM 80 MG PO TABS
80.0000 mg | ORAL_TABLET | Freq: Every day | ORAL | Status: DC
  Administered 2023-03-08 – 2023-03-10 (×3): 80 mg via ORAL
  Filled 2023-03-08 (×3): qty 1

## 2023-03-08 MED ORDER — SODIUM CHLORIDE 0.9 % IV BOLUS
250.0000 mL | Freq: Once | INTRAVENOUS | Status: AC
  Administered 2023-03-08: 250 mL via INTRAVENOUS

## 2023-03-08 MED ORDER — SODIUM CHLORIDE 0.9 % IV SOLN
500.0000 mg | INTRAVENOUS | Status: AC
  Administered 2023-03-09 – 2023-03-10 (×2): 500 mg via INTRAVENOUS
  Filled 2023-03-08 (×2): qty 5

## 2023-03-08 MED ORDER — SODIUM CHLORIDE 0.9 % IV SOLN
500.0000 mg | Freq: Once | INTRAVENOUS | Status: AC
  Administered 2023-03-08: 500 mg via INTRAVENOUS
  Filled 2023-03-08: qty 5

## 2023-03-08 MED ORDER — CEFTRIAXONE SODIUM 2 G IJ SOLR
2.0000 g | Freq: Once | INTRAMUSCULAR | Status: AC
  Administered 2023-03-08: 2 g via INTRAVENOUS
  Filled 2023-03-08: qty 20

## 2023-03-08 MED ORDER — SODIUM CHLORIDE 0.9% FLUSH
3.0000 mL | Freq: Two times a day (BID) | INTRAVENOUS | Status: DC
  Administered 2023-03-08 – 2023-03-11 (×6): 3 mL via INTRAVENOUS

## 2023-03-08 MED ORDER — APIXABAN 2.5 MG PO TABS
2.5000 mg | ORAL_TABLET | Freq: Two times a day (BID) | ORAL | Status: DC
Start: 2023-03-08 — End: 2023-03-11
  Administered 2023-03-08 – 2023-03-11 (×6): 2.5 mg via ORAL
  Filled 2023-03-08 (×6): qty 1

## 2023-03-08 MED ORDER — SODIUM CHLORIDE 0.9 % IV SOLN
2.0000 g | INTRAVENOUS | Status: DC
  Administered 2023-03-09 – 2023-03-10 (×2): 2 g via INTRAVENOUS
  Filled 2023-03-08 (×2): qty 20

## 2023-03-08 MED ORDER — PANTOPRAZOLE SODIUM 40 MG IV SOLR
40.0000 mg | INTRAVENOUS | Status: DC
  Administered 2023-03-08 – 2023-03-10 (×3): 40 mg via INTRAVENOUS
  Filled 2023-03-08 (×3): qty 10

## 2023-03-08 NOTE — Assessment & Plan Note (Signed)
 Suspect due to hypoperfusion or TIA like presentation. We will obtain stat head ct noncontrast.

## 2023-03-08 NOTE — Consult Note (Signed)
 NAME:  Stephanie Atkinson, MRN:  [id number], DOB:  11/17/1924, LOS: 0 ADMISSION DATE:  03/08/2023, CONSULTATION DATE:  1/11 REFERRING MD:  Dr. Tobie, CHIEF COMPLAINT:  hypotension; ams   History of Present Illness:  Patient is a 89 yo F CAD s/p CABG, HTN, HLD, hypothyroidism, afib on eliquis , symptomatic bradycardia s/p PPM, chronic systolic HF presents to [facility] on 1/11 w/ ams.  On 1/10 patient w/ smelly, cloudy urine and took macrobid  for presumed UTI. Noted to be having fever, chills, and cough. On 1/11 patient more altered and staring into space. Brought to [facility] ED 1/11. Afebrile and wbc 20.3. Initial BP 92/41. CXR concerning for infiltrates vs. Atelectasis. LA 2.5. Given gentle iv fluids. UA w/ rare bacteria. Bcx2 and UC pending. Covid, flu, rsv negative. PCT 1.11. CT renal stone w/ no acute findings. CT head no acute abnormality. Patient Aox3. Given hypotension PCCM consulted.  Pertinent  Medical History   Past Medical History:  Diagnosis Date   Chronic kidney disease, stage 3 (HCC)    Coronary artery disease    Hx of CABG 2006   LIMA-LAD, free RIMA-PDA, Radial-OM1-OM2   Hyperlipidemia    Hypertension    Hypothyroid    MI, old        PAF (paroxysmal atrial fibrillation) (HCC)    was on amio, d/c'd due to prolonged PR interval, rate control   Presence of permanent cardiac pacemaker 07/16/2017   Presence of stent in right coronary artery 07/12   RIMA-PDA occluded, DES stent placed   Syncope 07/12   bradycardic, beta blocker decreased, also felt to be dehydrated     Significant Hospital Events: Including procedures, antibiotic start and stop dates in addition to other pertinent events   1/11 admitted w/ hypotension/ams  Interim History / Subjective:  See above  Objective   Blood pressure (!) 111/46, pulse 68, temperature 98.3 F (36.8 C), resp. rate 18, height 5' 2 (1.575 m), weight 58.1 kg, SpO2 100%.       No intake or output data in the 24 hours ending 03/08/23 1907 Filed  Weights   03/08/23 1042  Weight: 58.1 kg    Examination: General:  elderly female in NAD HEENT: MM pink/dry Neuro: Aox3; MAE CV: s1s2, rate 60s, no m/r/g PULM:  dim clear BS bilaterally GI: soft, bsx4 active  Extremities: warm/dry, no edema  Skin: no rashes or lesions appreciated   Resolved Hospital Problem list     Assessment & Plan:   Sepsis: likely 2/2 to UTI; CXR w/ atelectasis vs. Infiltrate Hypotension Plan: -agree w/ iv fluids in setting of sepsis -hold home anti-hypertensive's -no icu needs at this time and seems appropriate for pcu -cont abx per primary -if patient's bp remains low despite fluids may need to consider transferring to icu and initiating pressors  AMS AKI on CKD Symptomatic bradycardia s/p PPM Hypercalcemia Hypothyroidism CAD HTN HLD ICM HFrEF Afib on eliquis  Plan: -per primary    Best Practice (right click and Reselect all SmartList Selections daily)   Per primary  Last date of multidisciplinary goals of care discussion [1/11 family and patient updated at bedside; state patient has living will and would want to be DNR/DNI.]  Labs   CBC: Recent Labs  Lab 03/08/23 1100  WBC 20.3*  NEUTROABS 18.8*  HGB 12.2  HCT 38.2  MCV 92.3  PLT 172    Basic Metabolic Panel: Recent Labs  Lab 03/08/23 1100  NA 136  K 4.5  CL 108  CO2  21*  GLUCOSE 112*  BUN 26*  CREATININE 1.63*  CALCIUM  10.5*   GFR: Estimated Creatinine Clearance: 16 mL/min (A) (by C-G formula based on SCr of 1.63 mg/dL (H)). Recent Labs  Lab 03/08/23 1100 03/08/23 1108 03/08/23 1116 03/08/23 1237  PROCALCITON  --   --  1.11  --   WBC 20.3*  --   --   --   LATICACIDVEN  --  1.6  --  2.5*    Liver Function Tests: Recent Labs  Lab 03/08/23 1100  AST 26  ALT 27  ALKPHOS 53  BILITOT 1.9*  PROT 5.8*  ALBUMIN 3.2*   No results for input(s): LIPASE, AMYLASE in the last 168 hours. No results for input(s): AMMONIA in the last 168  hours.  ABG    Component Value Date/Time   TCO2 28 08/18/2022 1138     Coagulation Profile: Recent Labs  Lab 03/08/23 1100  INR 1.5*    Cardiac Enzymes: No results for input(s): CKTOTAL, CKMB, CKMBINDEX, TROPONINI in the last 168 hours.  HbA1C: No results found for: HGBA1C  CBG: No results for input(s): GLUCAP in the last 168 hours.  Review of Systems:   Review of Systems  Constitutional:  Positive for chills and fever.  Respiratory:  Positive for cough. Negative for shortness of breath.   Gastrointestinal:  Positive for diarrhea.  Genitourinary:  Positive for dysuria.  Musculoskeletal:  Positive for back pain.     Past Medical History:  She,  has a past medical history of Chronic kidney disease, stage 3 (HCC), Coronary artery disease, CABG (2006), Hyperlipidemia, Hypertension, Hypothyroid, MI, old, PAF (paroxysmal atrial fibrillation) (HCC), Presence of permanent cardiac pacemaker (07/16/2017), Presence of stent in right coronary artery (07/12), and Syncope (07/12).   Surgical History:   Past Surgical History:  Procedure Laterality Date   CARDIAC CATHETERIZATION  [phone]   dr. alm harding, revealing a atretic bypass to her right with patent distal RCA stent   CAROTID STENT  2012   CORONARY ARTERY BYPASS GRAFT  2006   LIMA-LAD, free RIMA-PDA, Radial-OM1-OM2   DOPPLER ECHOCARDIOGRAPHY  [id number]   mild asymmetric left ventricular hypertrophy, left ventricular systolic function is low normal, ejection fraction = 50-55%, the LA is moderate dilated, the RA is mildly dilated, no significant valvular disease   INSERT / REPLACE / REMOVE PACEMAKER  07/16/2017   JOINT REPLACEMENT     hip replacement   LEFT HEART CATHETERIZATION WITH CORONARY/GRAFT ANGIOGRAM N/A 10/12/2012   Procedure: LEFT HEART CATHETERIZATION WITH EL BILE;  Surgeon: Alm LELON Clay, MD;  Location: [facility] CATH LAB;  Service: Cardiovascular;  Laterality: N/A;   LOOP RECORDER IMPLANT  N/A 10/15/2012   Procedure: LOOP RECORDER IMPLANT;  Surgeon: Jerel Balding, MD;  Location: [facility] CATH LAB;  Service: Cardiovascular;  Laterality: N/A;   LOOP RECORDER REMOVAL  07/16/2017   LOOP RECORDER REMOVAL N/A 07/16/2017   Procedure: LOOP RECORDER REMOVAL;  Surgeon: Balding Jerel, MD;  Location: [facility] INVASIVE CV LAB;  Service: Cardiovascular;  Laterality: N/A;   NM MYOVIEW  LTD  [id number]   post stress left ventricle is normal in size, post stress ejection fraction is 67% global left ventricular systolic function is normal, normal myocardial perfusion study, abnormal myocardial perfusion study, low risk scan   PACEMAKER IMPLANT N/A 07/16/2017   Procedure: PACEMAKER IMPLANT;  Surgeon: Balding Jerel, MD;  Location: [facility] INVASIVE CV LAB;  Service: Cardiovascular;  Laterality: N/A;   THYROIDECTOMY       Social History:   reports  that she has never smoked. Her smokeless tobacco use includes snuff. She reports that she does not drink alcohol  and does not use drugs.   Family History:  Her family history includes CAD in her maternal grandmother and mother.   Allergies Allergies  Allergen Reactions   Penicillins Hives   Sulfa Antibiotics Hives and Swelling   Gabapentin  Other (See Comments)    Confusion   Lyrica [Pregabalin] Other (See Comments)    Confusion     Home Medications  Prior to Admission medications   Medication Sig Start Date End Date Taking? Authorizing Provider  acetaminophen  (TYLENOL ) 500 MG tablet Take 1 tablet (500 mg total) by mouth every 6 (six) hours as needed for moderate pain. Patient taking differently: Take 1,000 mg by mouth 3 (three) times daily. 06/27/19  Yes Ezenduka, Nkeiruka J, MD  atorvastatin  (LIPITOR ) 80 MG tablet Take 80 mg by mouth at bedtime.    Yes [provider]  carvedilol  (COREG ) 3.125 MG tablet Take 1 tablet (3.125 mg total) by mouth 2 (two) times daily. 04/10/22  Yes Court Dorn PARAS, MD  ELIQUIS  2.5 MG TABS tablet TAKE 1 TABLET BY MOUTH TWICE A  DAY 09/26/22  Yes Court Dorn PARAS, MD  furosemide  (LASIX ) 20 MG tablet Take 1 tablet (20 mg total) by mouth daily. Take two tablets in case of weight gain 2 to 3 lbs in 24 hrs or 5 lbs in 7 days, until weight back to baseline. Patient taking differently: Take 20 mg by mouth See admin instructions. Take 20mg  by mouth on Tuesday, Thursday, Saturday, Sunday. 02/10/23  Yes Croitoru, Mihai, MD  furosemide  (LASIX ) 40 MG tablet Take 40 mg by mouth See admin instructions. Take 40mg  by mouth every Monday, Wednesday, and Friday. 01/23/23  Yes [provider]  HYDROcodone -acetaminophen  (NORCO/VICODIN) 5-325 MG tablet Take 1 tablet by mouth See admin instructions. Take 1 tablet by mouth twice a day. May take a third dose if needed for breakthrough pain. 03/03/23  Yes [provider]  levothyroxine  (SYNTHROID , LEVOTHROID) 50 MCG tablet Take 50 mcg by mouth daily before breakfast.    Yes [provider]  lidocaine  (LIDODERM ) 5 % Place 1 patch onto the skin daily. 02/25/23  Yes [provider]  nitrofurantoin , macrocrystal-monohydrate, (MACROBID ) 100 MG capsule Take 100 mg by mouth 2 (two) times daily. 03/03/23  Yes [provider]  Polyethyl Glycol-Propyl Glycol (SYSTANE OP) Place 1 drop into both eyes daily as needed (dry eye).   Yes [provider]  sacubitril -valsartan  (ENTRESTO ) 24-26 MG Take 1 tablet by mouth 2 (two) times daily. 02/06/23  Yes Croitoru, Mihai, MD  spironolactone  (ALDACTONE ) 25 MG tablet Take 1 tablet (25 mg total) by mouth daily. 01/22/23  Yes Court Dorn PARAS, MD  traMADol  (ULTRAM ) 50 MG tablet Take 50 mg by mouth every 8 (eight) hours as needed. Patient not taking: Reported on 03/08/2023 01/27/23   [provider]  VITAMIN D3 50 MCG (2000 UT) capsule Take 1 capsule by mouth once a week. Patient not taking: Reported on 03/08/2023 02/03/22   [provider]          JD Emilio RIGGERS [facility] 03/08/2023, 7:07 PM  Please see [email or web address] for pager details.  From 7A-7P if no response, please call [phone]. After hours, please call ELink [phone].

## 2023-03-08 NOTE — H&P (Addendum)
 History and Physical    Patient: Stephanie Atkinson FMW:[id number] DOB: 03/08/1924 DOA: 03/08/2023 DOS: the patient was seen and examined on 03/08/2023 PCP: Corlis Pagan, NP  Patient coming from: Home Chief complaint: Chief Complaint  Patient presents with   Weakness   Altered Mental Status   HPI:  Stephanie Atkinson is a independent 89 y.o. female with past medical history  of  coronary artery disease status post CABG, hypertension, hyperlipidemia, hypothyroidism, atrial fibrillation on Eliquis , symptomatic bradycardia and syncope, status post PPM (Medtronic Azure XT SR implanted May 2019).  , chronic systolic heart failure on Entresto , carvedilol , spironolactone , Jardiance ,comes to the ED today with generalized weakness and confusion.  Patient was admitted in November 2024 with similar complaints for altered mental status hypercalcemia chronic systolic heart failure chronic kidney disease stage III, lower back pain, patient was also seen by cardiology in December 2024 for pacemaker check and jardiance  was d/c due to low blood pressure and pt has soft BP and hypercalcemia.  Per grandson this morning patient was altered when she was sitting and he was asking her to do something she gets staring into space.  He states that they were worried for stroke and that is why they brought her here today. ED Course: In emergency room vitals trend shows that pt meets sepsis criteria.  Vitals:   03/08/23 1745 03/08/23 1845 03/08/23 1919 03/08/23 1935  BP: (!) 120/52 (!) 111/46 (!) 121/42   Pulse: 68  (!) 58   Temp:    98.7 F (37.1 C)  Resp: 18  (!) 23   Height:      Weight:      SpO2: 100%  97%   TempSrc:    Oral  BMI (Calculated):       Evaluation so far shows: Chest x-ray concerning for bibasilar infiltrates.  Procalcitonin ordered and pending, lactic acid 2.5,'s CBC shows white count of 20.3. EKG shows regular paced rhythm at 60 a left bundle branch block PR 192 QTc 470. CMP showing AKI on CKD stage II  current creatinine of 1.63 EGFR of 29 calcium  10.5 total bili 1.9 bicarb 21 BUN of 26. Respiratory panel negative for influenza RSV and COVID. Urinalysis within normal limits. Blood cultures collected in the ED. CT renal stone study done today shows no acute findings in the abdomen.  Please refer to complete report.  Patient also had a CT abdomen pelvis with contrast in December which showed diverticulosis cardiomegaly aortic atherosclerosis and no acute abdominal pelvic findings. Admission requested for confusion leukocytosis and sepsis presentation and suspected pneumonia.   In the ED pt received: Medications  cefTRIAXone  (ROCEPHIN ) 2 g in sodium chloride  0.9 % 100 mL IVPB (2 g Intravenous New Bag/Given 03/08/23 1405)  azithromycin  (ZITHROMAX ) 500 mg in sodium chloride  0.9 % 250 mL IVPB (500 mg Intravenous New Bag/Given 03/08/23 1404)  sodium chloride  0.9 % bolus 1,000 mL (0 mLs Intravenous Stopped 03/08/23 1228)   Review of Systems  Unable to perform ROS: Acuity of condition  Eyes:  Positive for double vision.  Musculoskeletal:  Positive for back pain.  All other systems reviewed and are negative.  Past Medical History:  Diagnosis Date   Chronic kidney disease, stage 3 (HCC)    Coronary artery disease    Hx of CABG 2006   LIMA-LAD, free RIMA-PDA, Radial-OM1-OM2   Hyperlipidemia    Hypertension    Hypothyroid    MI, old        PAF (paroxysmal atrial fibrillation) (HCC)  was on amio, d/c'd due to prolonged PR interval, rate control   Presence of permanent cardiac pacemaker 07/16/2017   Presence of stent in right coronary artery 07/12   RIMA-PDA occluded, DES stent placed   Syncope 07/12   bradycardic, beta blocker decreased, also felt to be dehydrated   Past Surgical History:  Procedure Laterality Date   CARDIAC CATHETERIZATION  101512   dr. alm harding, revealing a atretic bypass to her right with patent distal RCA stent   CAROTID STENT  2012   CORONARY ARTERY BYPASS  GRAFT  2006   LIMA-LAD, free RIMA-PDA, Radial-OM1-OM2   DOPPLER ECHOCARDIOGRAPHY  [id number]   mild asymmetric left ventricular hypertrophy, left ventricular systolic function is low normal, ejection fraction = 50-55%, the LA is moderate dilated, the RA is mildly dilated, no significant valvular disease   INSERT / REPLACE / REMOVE PACEMAKER  07/16/2017   JOINT REPLACEMENT     hip replacement   LEFT HEART CATHETERIZATION WITH CORONARY/GRAFT ANGIOGRAM N/A 10/12/2012   Procedure: LEFT HEART CATHETERIZATION WITH EL BILE;  Surgeon: Alm LELON Clay, MD;  Location: [facility] C[facility] LAB;  Service: Cardiovascular;  Laterality: N/A;   LOOP RECORDER IMPLANT N/A 10/15/2012   Procedure: LOOP RECORDER IMPLANT;  Surgeon: Jerel Balding, MD;  Location: [facility] CATH LAB;  Service: Cardiovascular;  Laterality: N/A;   LOOP RECORDER REMOVAL  07/16/2017   LOOP RECORDER REMOVAL N/A 07/16/2017   Procedure: LOOP RECORDER REMOVAL;  Surgeon: Balding Jerel, MD;  Location: [facility] INVASIVE CV LAB;  Service: Cardiovascular;  Laterality: N/A;   NM MYOVIEW  LTD  100510   post stress left ventricle is normal in size, post stress ejection fraction is 67% global left ventricular systolic function is normal, normal myocardial perfusion study, abnormal myocardial perfusion study, low risk scan   PACEMAKER IMPLANT N/A 07/16/2017   Procedure: PACEMAKER IMPLANT;  Surgeon: Balding Jerel, MD;  Location: [facility] INVASIVE CV LAB;  Service: Cardiovascular;  Laterality: N/A;   THYROIDECTOMY      reports that she has never smoked. Her smokeless tobacco use includes snuff. She reports that she does not drink alcohol  and does not use drugs.  Allergies  Allergen Reactions   Penicillins Hives   Sulfa Antibiotics Hives and Swelling   Gabapentin  Other (See Comments)    Confusion   Lyrica [Pregabalin] Other (See Comments)    Confusion    Family History  Problem Relation Age of Onset   CAD Mother    CAD Maternal Grandmother     Prior to  Admission medications   Medication Sig Start Date End Date Taking? Authorizing Provider  acetaminophen  (TYLENOL ) 500 MG tablet Take 1 tablet (500 mg total) by mouth every 6 (six) hours as needed for moderate pain. Patient taking differently: Take 1,000 mg by mouth 3 (three) times daily. 06/27/19  Yes Ezenduka, Nkeiruka J, MD  atorvastatin  (LIPITOR ) 80 MG tablet Take 80 mg by mouth at bedtime.    Yes [provider]  carvedilol  (COREG ) 3.125 MG tablet Take 1 tablet (3.125 mg total) by mouth 2 (two) times daily. 04/10/22  Yes Court Dorn PARAS, MD  ELIQUIS  2.5 MG TABS tablet TAKE 1 TABLET BY MOUTH TWICE A DAY 09/26/22  Yes Court Dorn PARAS, MD  furosemide  (LASIX ) 20 MG tablet Take 1 tablet (20 mg total) by mouth daily. Take two tablets in case of weight gain 2 to 3 lbs in 24 hrs or 5 lbs in 7 days, until weight back to baseline. Patient taking differently: Take 20 mg by  mouth See admin instructions. Take 20mg  by mouth on Tuesday, Thursday, Saturday, Sunday. 02/10/23  Yes Croitoru, Mihai, MD  furosemide  (LASIX ) 40 MG tablet Take 40 mg by mouth See admin instructions. Take 40mg  by mouth every Monday, Wednesday, and Friday. 01/23/23  Yes [provider]  HYDROcodone -acetaminophen  (NORCO/VICODIN) 5-325 MG tablet Take 1 tablet by mouth See admin instructions. Take 1 tablet by mouth twice a day. May take a third dose if needed for breakthrough pain. 03/03/23  Yes [provider]  levothyroxine  (SYNTHROID , LEVOTHROID) 50 MCG tablet Take 50 mcg by mouth daily before breakfast.    Yes [provider]  lidocaine  (LIDODERM ) 5 % Place 1 patch onto the skin daily. 02/25/23  Yes [provider]  nitrofurantoin , macrocrystal-monohydrate, (MACROBID ) 100 MG capsule Take 100 mg by mouth 2 (two) times daily. 03/03/23  Yes [provider]  Polyethyl Glycol-Propyl Glycol (SYSTANE OP) Place 1 drop into both eyes daily as needed (dry eye).   Yes [provider]   sacubitril -valsartan  (ENTRESTO ) 24-26 MG Take 1 tablet by mouth 2 (two) times daily. 02/06/23  Yes Croitoru, Mihai, MD  spironolactone  (ALDACTONE ) 25 MG tablet Take 1 tablet (25 mg total) by mouth daily. 01/22/23  Yes Court Dorn PARAS, MD  traMADol  (ULTRAM ) 50 MG tablet Take 50 mg by mouth every 8 (eight) hours as needed. Patient not taking: Reported on 03/08/2023 01/27/23   [provider]  VITAMIN D3 50 MCG (2000 UT) capsule Take 1 capsule by mouth once a week. Patient not taking: Reported on 03/08/2023 02/03/22   [provider]    Vitals:   03/08/23 1745 03/08/23 1845 03/08/23 1919 03/08/23 1935  BP: (!) 120/52 (!) 111/46 (!) 121/42   Pulse: 68  (!) 58   Resp: 18  (!) 23   Temp:    98.7 F (37.1 C)  TempSrc:    Oral  SpO2: 100%  97%   Weight:      Height:       Physical Exam Constitutional:      General: She is not in acute distress.    Appearance: She is obese. She is not ill-appearing.  HENT:     Head: Normocephalic.     Right Ear: External ear normal.     Left Ear: External ear normal.     Mouth/Throat:     Lips: Pink.     Tongue: Tongue does not deviate from midline.  Eyes:     Extraocular Movements: Extraocular movements intact.     Pupils: Pupils are equal, round, and reactive to light.  Cardiovascular:     Rate and Rhythm: Normal rate and regular rhythm.     Pulses: Normal pulses.     Heart sounds: Normal heart sounds.  Pulmonary:     Breath sounds: Normal breath sounds.  Abdominal:     General: Bowel sounds are normal.     Palpations: Abdomen is soft.  Musculoskeletal:     Right lower leg: No edema.     Left lower leg: No edema.  Neurological:     General: No focal deficit present.     Mental Status: She is alert and oriented to person, place, and time.     Cranial Nerves: No cranial nerve deficit, dysarthria or facial asymmetry.  Psychiatric:        Attention and Perception: Attention normal.        Mood and Affect: Mood normal.         Speech: Speech normal.  Behavior: Behavior normal. Behavior is cooperative.        Cognition and Memory: Cognition normal.      Labs on Admission: I have personally reviewed following labs and imaging studies Results for orders placed or performed during the hospital encounter of 03/08/23 (from the past 24 hours)  Resp panel by RT-PCR (RSV, Flu A&B, Covid) Anterior Nasal Swab     Status: None   Collection Time: 03/08/23 10:46 AM   Specimen: Anterior Nasal Swab  Result Value Ref Range   SARS Coronavirus 2 by RT PCR NEGATIVE NEGATIVE   Influenza A by PCR NEGATIVE NEGATIVE   Influenza B by PCR NEGATIVE NEGATIVE   Resp Syncytial Virus by PCR NEGATIVE NEGATIVE  Comprehensive metabolic panel     Status: Abnormal   Collection Time: 03/08/23 11:00 AM  Result Value Ref Range   Sodium 136 135 - 145 mmol/L   Potassium 4.5 3.5 - 5.1 mmol/L   Chloride 108 98 - 111 mmol/L   CO2 21 (L) 22 - 32 mmol/L   Glucose, Bld 112 (H) 70 - 99 mg/dL   BUN 26 (H) 8 - 23 mg/dL   Creatinine, Ser 8.36 (H) 0.44 - 1.00 mg/dL   Calcium  10.5 (H) 8.9 - 10.3 mg/dL   Total Protein 5.8 (L) 6.5 - 8.1 g/dL   Albumin 3.2 (L) 3.5 - 5.0 g/dL   AST 26 15 - 41 U/L   ALT 27 0 - 44 U/L   Alkaline Phosphatase 53 38 - 126 U/L   Total Bilirubin 1.9 (H) 0.0 - 1.2 mg/dL   GFR, Estimated 29 (L) >60 mL/min   Anion gap 7 5 - 15  CBC with Differential     Status: Abnormal   Collection Time: 03/08/23 11:00 AM  Result Value Ref Range   WBC 20.3 (H) 4.0 - 10.5 K/uL   RBC 4.14 3.87 - 5.11 MIL/uL   Hemoglobin 12.2 12.0 - 15.0 g/dL   HCT 61.7 63.9 - 53.9 %   MCV 92.3 80.0 - 100.0 fL   MCH 29.5 26.0 - 34.0 pg   MCHC 31.9 30.0 - 36.0 g/dL   RDW 86.1 88.4 - 84.4 %   Platelets 172 150 - 400 K/uL   nRBC 0.0 0.0 - 0.2 %   Neutrophils Relative % 92 %   Neutro Abs 18.8 (H) 1.7 - 7.7 K/uL   Lymphocytes Relative 3 %   Lymphs Abs 0.6 (L) 0.7 - 4.0 K/uL   Monocytes Relative 4 %   Monocytes Absolute 0.8 0.1 - 1.0 K/uL    Eosinophils Relative 0 %   Eosinophils Absolute 0.0 0.0 - 0.5 K/uL   Basophils Relative 0 %   Basophils Absolute 0.0 0.0 - 0.1 K/uL   Immature Granulocytes 1 %   Abs Immature Granulocytes 0.14 (H) 0.00 - 0.07 K/uL  Protime-INR     Status: Abnormal   Collection Time: 03/08/23 11:00 AM  Result Value Ref Range   Prothrombin Time 18.2 (H) 11.4 - 15.2 seconds   INR 1.5 (H) 0.8 - 1.2  APTT     Status: None   Collection Time: 03/08/23 11:00 AM  Result Value Ref Range   aPTT 35 24 - 36 seconds  I-Stat Lactic Acid, ED     Status: None   Collection Time: 03/08/23 11:08 AM  Result Value Ref Range   Lactic Acid, Venous 1.6 0.5 - 1.9 mmol/L  Procalcitonin     Status: None   Collection Time: 03/08/23 11:16 AM  Result Value Ref Range   Procalcitonin 1.11 ng/mL  Urinalysis, w/ Reflex to Culture (Infection Suspected) -Urine, Clean Catch     Status: Abnormal   Collection Time: 03/08/23 12:36 PM  Result Value Ref Range   Specimen Source URINE, CLEAN CATCH    Color, Urine YELLOW YELLOW   APPearance CLEAR CLEAR   Specific Gravity, Urine 1.009 1.005 - 1.030   pH 5.0 5.0 - 8.0   Glucose, UA NEGATIVE NEGATIVE mg/dL   Hgb urine dipstick NEGATIVE NEGATIVE   Bilirubin Urine NEGATIVE NEGATIVE   Ketones, ur NEGATIVE NEGATIVE mg/dL   Protein, ur NEGATIVE NEGATIVE mg/dL   Nitrite NEGATIVE NEGATIVE   Leukocytes,Ua NEGATIVE NEGATIVE   RBC / HPF 0-5 0 - 5 RBC/hpf   WBC, UA 0-5 0 - 5 WBC/hpf   Bacteria, UA RARE (A) NONE SEEN   Squamous Epithelial / HPF 0-5 0 - 5 /HPF   Hyaline Casts, UA PRESENT   I-Stat Lactic Acid, ED     Status: Abnormal   Collection Time: 03/08/23 12:37 PM  Result Value Ref Range   Lactic Acid, Venous 2.5 (HH) 0.5 - 1.9 mmol/L   Comment NOTIFIED PHYSICIAN    Recent Results (from the past 720 hours)  C Difficile Quick Screen w PCR reflex     Status: None   Collection Time: 02/20/23 10:34 AM   Specimen: Stool  Result Value Ref Range Status   C Diff antigen NEGATIVE NEGATIVE Final    C Diff toxin NEGATIVE NEGATIVE Final   C Diff interpretation No C. difficile detected.  Final    Comment: Performed at [facility], [address], [place], [place] [address]  Resp panel by RT-PCR (RSV, Flu A&B, Covid) Anterior Nasal Swab     Status: None   Collection Time: 03/08/23 10:46 AM   Specimen: Anterior Nasal Swab  Result Value Ref Range Status   SARS Coronavirus 2 by RT PCR NEGATIVE NEGATIVE Final   Influenza A by PCR NEGATIVE NEGATIVE Final   Influenza B by PCR NEGATIVE NEGATIVE Final    Comment: (NOTE) The Xpert Xpress SARS-CoV-2/FLU/RSV plus assay is intended as an aid in the diagnosis of influenza from Nasopharyngeal swab specimens and should not be used as a sole basis for treatment. Nasal washings and aspirates are unacceptable for Xpert Xpress SARS-CoV-2/FLU/RSV testing.  Fact Sheet for Patients: bloggercourse.com  Fact Sheet for Healthcare Providers: seriousbroker.it  This test is not yet approved or cleared by the [place]  FDA and has been authorized for detection and/or diagnosis of SARS-CoV-2 by FDA under an Emergency Use Authorization (EUA). This EUA will remain in effect (meaning this test can be used) for the duration of the COVID-19 declaration under Section 564(b)(1) of the Act, 21 U.S.C. section 360bbb-3(b)(1), unless the authorization is terminated or revoked.     Resp Syncytial Virus by PCR NEGATIVE NEGATIVE Final    Comment: (NOTE) Fact Sheet for Patients: bloggercourse.com  Fact Sheet for Healthcare Providers: seriousbroker.it  This test is not yet approved or cleared by the [place]  FDA and has been authorized for detection and/or diagnosis of SARS-CoV-2 by FDA under an Emergency Use Authorization (EUA). This EUA will remain in effect (meaning this test can be used) for the duration of the COVID-19 declaration under Section  564(b)(1) of the Act, 21 U.S.C. section 360bbb-3(b)(1), unless the authorization is terminated or revoked.  Performed at [facility], [address] [place], [place] [address]    CBC:  Latest Ref Rng & Units 03/08/2023   11:00 AM 02/20/2023   10:15 AM 01/17/2023    5:40 AM  CBC  WBC 4.0 - 10.5 K/uL 20.3  3.5  3.4   Hemoglobin 12.0 - 15.0 g/dL 87.7  86.2  85.8   Hematocrit 36.0 - 46.0 % 38.2  42.5  44.4   Platelets 150 - 400 K/uL 172  163  185    Basic Metabolic Panel: Recent Labs  Lab 03/08/23 1100  NA 136  K 4.5  CL 108  CO2 21*  GLUCOSE 112*  BUN 26*  CREATININE 1.63*  CALCIUM  10.5*   Creatinine: Lab Results  Component Value Date   CREATININE 1.63 (H) 03/08/2023   CREATININE 1.12 (H) 02/20/2023   CREATININE 1.12 (H) 01/27/2023   Liver Function Tests:    Latest Ref Rng & Units 03/08/2023   11:00 AM 02/20/2023   10:15 AM 01/16/2023   12:00 PM  Hepatic Function  Total Protein 6.5 - 8.1 g/dL 5.8  6.1  6.3   Albumin 3.5 - 5.0 g/dL 3.2  3.7  3.4   AST 15 - 41 U/L 26  24  29    ALT 0 - 44 U/L 27  21  26    Alk Phosphatase 38 - 126 U/L 53  57  59   Total Bilirubin 0.0 - 1.2 mg/dL 1.9  1.2  1.3    Coagulation Profile: Recent Labs  Lab 03/08/23 1100  INR 1.5*   Cardiac Enzymes: No results for input(s): CKTOTAL, CKMB, CKMBINDEX, TROPONINI in the last 168 hours. BNP (last 3 results) No results for input(s): PROBNP in the last 8760 hours. HbA1C: No results for input(s): HGBA1C in the last 72 hours. Lipid Profile: No results for input(s): CHOL, HDL, LDLCALC, TRIG, CHOLHDL, LDLDIRECT in the last 72 hours. Urinalysis    Component Value Date/Time   COLORURINE YELLOW 03/08/2023 1236   APPEARANCEUR CLEAR 03/08/2023 1236   LABSPEC 1.009 03/08/2023 1236   PHURINE 5.0 03/08/2023 1236   GLUCOSEU NEGATIVE 03/08/2023 1236   HGBUR NEGATIVE 03/08/2023 1236   BILIRUBINUR NEGATIVE 03/08/2023 1236   KETONESUR NEGATIVE 03/08/2023 1236    PROTEINUR NEGATIVE 03/08/2023 1236   UROBILINOGEN 1.0 10/14/2012 0922   NITRITE NEGATIVE 03/08/2023 1236   LEUKOCYTESUR NEGATIVE 03/08/2023 1236    Unresulted Labs (From admission, onward)     Start     Ordered   03/09/23 0500  Comprehensive metabolic panel  Tomorrow morning,   R        03/08/23 1618   03/09/23 0500  CBC  Tomorrow morning,   R        03/08/23 1618   03/08/23 1925  Hemoglobin and hematocrit, blood  ONCE - STAT,   STAT        03/08/23 1924   03/08/23 1538  Parathyroid  hormone, intact (no Ca)  Add-on,   AD        03/08/23 1538   03/08/23 1046  Blood Culture (routine x 2)  (Undifferentiated presentation (screening labs and basic nursing orders))  BLOOD CULTURE X 2,   STAT      03/08/23 1045           Radiological Exams on Admission: CT HEAD WO CONTRAST ( ) Result Date: 03/08/2023 CLINICAL DATA:  Altered mental status, diplopia, weakness EXAM: CT HEAD WITHOUT CONTRAST TECHNIQUE: Contiguous axial images were obtained from the base of the skull through the vertex without intravenous contrast. RADIATION DOSE REDUCTION: This exam was performed according to the departmental  dose-optimization program which includes automated exposure control, adjustment of the mA and/or kV according to patient size and/or use of iterative reconstruction technique. COMPARISON:  01/16/2023 FINDINGS: Brain: No evidence of acute infarction, hemorrhage, mass, mass effect, or midline shift. No hydrocephalus or extra-axial fluid collection. Periventricular white matter changes, likely the sequela of chronic small vessel ischemic disease. Age related cerebral atrophy. Chronic lacunar infarcts in the left thalamus, left basal ganglia, and right cerebellum. Vascular: No hyperdense vessel. Atherosclerotic calcifications in the intracranial carotid and vertebral arteries. Skull: Negative for fracture or focal lesion. Sinuses/Orbits: No acute finding. Status post bilateral lens replacements. Other: Trace fluid  in the right mastoid air cells. IMPRESSION: No acute intracranial process. Electronically Signed   By: Donald Campion M.D.   On: 03/08/2023 19:09   DG Chest 1 View Result Date: 03/08/2023 CLINICAL DATA:  Sepsis EXAM: CHEST  1 VIEW COMPARISON:  01/16/2023 FINDINGS: Left-sided implanted cardiac device remains in place. Stable cardiomegaly status post sternotomy and CABG. Aortic atherosclerosis. Increased bibasilar interstitial markings without lobar consolidation. No visible pleural effusion. No pneumothorax. IMPRESSION: Increased bibasilar interstitial markings. Findings may reflect atelectasis versus atypical/viral infection. Electronically Signed   By: Mabel Converse D.O.   On: 03/08/2023 11:36   CT Renal Stone Study Result Date: 03/08/2023 CLINICAL DATA:  Abdominal flank pain, stone suspected EXAM: CT ABDOMEN AND PELVIS WITHOUT CONTRAST TECHNIQUE: Multidetector CT imaging of the abdomen and pelvis was performed following the standard protocol without IV contrast. RADIATION DOSE REDUCTION: This exam was performed according to the departmental dose-optimization program which includes automated exposure control, adjustment of the mA and/or kV according to patient size and/or use of iterative reconstruction technique. COMPARISON:  None Available. FINDINGS: Lower chest: Mild LEFT basilar atelectasis. Hepatobiliary: No focal hepatic lesion. Postcholecystectomy. No biliary dilatation. Pancreas: Pancreas is normal. No ductal dilatation. No pancreatic inflammation. Spleen: Normal spleen Adrenals/urinary tract: Adrenal glands are normal. No nephrolithiasis ureterolithiasis. No obstructive uropathy. No bladder calculi. Simple fluid attenuation cyst partially exophytic from the LEFT kidney measures 2.5 cm. Stomach/Bowel: Stomach, duodenum small-bowel normal. Appendix not identified. No secondary signs appendicitis. The colon and rectosigmoid colon are normal. Vascular/Lymphatic: Abdominal aorta is normal caliber with  atherosclerotic calcification. There is no retroperitoneal or periportal lymphadenopathy. No pelvic lymphadenopathy. Reproductive: Uterus and adnexa unremarkable. Other: No free fluid. Musculoskeletal: RIGHT hip prosthetic.  No acute osseous findings. IMPRESSION: 1. No acute findings in the abdomen pelvis. 2. No nephrolithiasis ureterolithiasis or obstructive uropathy. 3. Left Bosniak I benign renal cyst measuring 2.5 cm. No follow-up imaging is recommended. JACR 2018 Feb; 264-273, Management of the Incidental Renal Mass on CT, [employer] 2021; 814-848, Bosniak Classification of Cystic Renal Masses, Version 2019. 4.  Aortic Atherosclerosis (ICD10-I70.0). Electronically Signed   By: Jackquline Boxer M.D.   On: 03/08/2023 11:33   Data Reviewed: Relevant notes from primary care and specialist visits, past discharge summaries as available in EHR, including Care Everywhere. Prior diagnostic testing as pertinent to current admission diagnoses, Updated medications and problem lists for reconciliation ED course, including vitals, labs, imaging, treatment and response to treatment,Triage notes, nursing and pharmacy notes and ED provider's notes Notable results as noted in HPI.Discussed case with EDMD/ ED APP/ or Specialty MD on call and as needed.  Assessment and Plan: * AMS (altered mental status) Suspect due to hypoperfusion or TIA like presentation. We will obtain stat head ct noncontrast.   Acute kidney injury superimposed on chronic kidney disease (HCC) Lab Results  Component Value Date   CREATININE 1.63 (  H) 03/08/2023   CREATININE 1.12 (H) 02/20/2023   CREATININE 1.12 (H) 01/27/2023   Most likely from hypotension and hypoperfusion from reduced EF CHF. Will hold nephrotoxic agents meds avoid contrast and continue with gentle IV fluid hydration in light of patient's CHF history.   Strict I's and O's and daily weights.  sodium chloride  50 mL/hr at 03/08/23 1717   sodium chloride     No intake or  output data in the 24 hours ending 03/08/23 1909    Symptomatic bradycardia s/p PPM  Patient's status post [employer]tr[employer] pacemaker check in December.  Will interrogate as needed will currently monitor on telemetry. Cont eliquis .   Hypercalcemia Pt continued on gentle ivf hydration. Due to her soft to low BP even with holding her meds. Intact pth.  D/D include primary HyperPTH or secondary from CKD.   Hypothyroidism Continue patient on 50 mcg of levothyroxine .  Hypertension Vitals:   03/08/23 1041 03/08/23 1100 03/08/23 1115 03/08/23 1135  BP: (!) 98/43 (!) 92/41 (!) 89/37 (!) 98/44   03/08/23 1300 03/08/23 1400 03/08/23 1500 03/08/23 1515  BP: 107/61 101/69 (!) 94/50 (!) 88/54  Due to patient's soft blood pressures we will currently hold her Entresto , Lasix , Aldactone , Coreg .   CAD (coronary artery disease) of artery bypass graft No complaints of chest pain or anginal equivalent symptoms.  Cont Eliquis  and atorvastatin .  Sepsis (HCC) Requested pccm to consult for possible ICU pt has taken macrobid  yesterday and is DNR/DNI with living will in chart.     Hypotension Blood pressure has been soft and culture are collected. We will follow WBC count and culture and consider abx. Repeat h/h. Vitals:   03/08/23 1041 03/08/23 1100 03/08/23 1115 03/08/23 1135  BP: (!) 98/43 (!) 92/41 (!) 89/37 (!) 98/44   03/08/23 1300 03/08/23 1400 03/08/23 1500 03/08/23 1515  BP: 107/61 101/69 (!) 94/50 (!) 88/54   03/08/23 1700 03/08/23 1745 03/08/23 1845 03/08/23 1919  BP: (!) 102/50 (!) 120/52 (!) 111/46 (!) 121/42      DVT prophylaxis:  Eliquis .   Consults:  Cardiology: Paticia Blanch. PCCM: John payne.   Advance Care Planning:    Code Status: Full Code   Family Communication:  Grandson.   Disposition Plan:  TBD.  Severity of Illness: The appropriate patient status for this patient is INPATIENT. Inpatient status is judged to be reasonable and necessary in order to provide the  required intensity of service to ensure the patient's safety. The patient's presenting symptoms, physical exam findings, and initial radiographic and laboratory data in the context of their chronic comorbidities is felt to place them at high risk for further clinical deterioration. Furthermore, it is not anticipated that the patient will be medically stable for discharge from the hospital within 2 midnights of admission.   * I certify that at the point of admission it is my clinical judgment that the patient will require inpatient hospital care spanning beyond 2 midnights from the point of admission due to high intensity of service, high risk for further deterioration and high frequency of surveillance required.*  Author: Mario LULLA Blanch, MD 03/08/2023 7:36 PM  For on call review [email or web address].  Orders Placed This Encounter  Procedures   Blood Culture (routine x 2)   Resp panel by RT-PCR (RSV, Flu A&B, Covid) Anterior Nasal Swab   CT Renal Stone Study   DG Chest 1 View   CT HEAD WO CONTRAST ( )   Comprehensive metabolic panel   CBC with Differential   Protime-INR  APTT   Urinalysis, w/ Reflex to Culture (Infection Suspected) -Urine, Clean Catch   Procalcitonin   Parathyroid  hormone, intact (no Ca)   Comprehensive metabolic panel   CBC   Hemoglobin and hematocrit, blood   DIET DYS 3 Room service appropriate? Yes; Fluid consistency: Thin   Document height and weight   Assess and Document Glasgow Coma Scale   Document vital signs within 1-hour of fluid bolus completion.  Notify provider of abnormal vital signs despite fluid resuscitation.   Refer to Sidebar Report: Sepsis Bundle ED/IP   Notify provider for difficulties obtaining IV access   Initiate Carrier Fluid Protocol   Catherize if unable to void   Maintain IV access   Vital signs   Notify physician (specify)   Mobility Protocol: No Restrictions   Refer to Sidebar Report Refer to ICU, Med-Surg, Progressive, and Step-Down  Mobility Protocol Sidebars   Initiate Adult Central Line Maintenance and Catheter Protocol for patients with central line (CVC, PICC, Port, Hemodialysis, Trialysis)   Daily weights   Intake and Output   Do not place and if present remove PureWick   Initiate Oral Care Protocol   Initiate Carrier Fluid Protocol   RN may order General Admission PRN Orders utilizing General Admission PRN medications (through manage orders) for the following patient needs: allergy symptoms (Claritin ), cold sores (Carmex), cough (Robitussin DM), eye irritation (Liquifilm Tears), hemorrhoids (Tucks), indigestion (Maalox), minor skin irritation (Hydrocortisone Cream), muscle pain Lucienne Gay), nose irritation (saline nasal spray) and sore throat (Chloraseptic spray).   Cardiac Monitoring - Continuous Indefinite   Swallow screen   Strict intake and output   Daily weights   Full code   Consult to hospitalist   Inpatient consult to Cardiology Consult Timeframe: ROUTINE - requires response within 24 hours; Reason for Consult? HYPOTENSION.   Consult to intensivist Consult Timeframe: ROUTINE - requires response within 24 hours; Instructions: At [facility] and [facility] campuses, [facility] consult orders are only for documentation-tracking purposes; a phone call MUST be made.; Reason for Consult? Hypovolemi...   Pulse oximetry check with vital signs   Oxygen therapy Mode or (Route): Nasal cannula; Liters Per Minute: 2; Keep O2 saturation between: greater than 92 %   I-Stat Lactic Acid, ED   ED EKG   EKG 12-Lead   Insert peripheral IV X 1   Admit to Inpatient (patient's expected length of stay will be greater than 2 midnights or inpatient only procedure)   Aspiration precautions   Fall precautions    Reached out to daughter and verified code status and pt is DNR/DNI.  Also that pt was given abx of which she only took 1 pill. We will continue rocephin  and monitor. GREATLY APPRECIATE PCCM AND CARDIOLOGY URGENT EVALUATION.  GREATLY APPRECIATE  on call MD Dr.Hall following and managing patient.

## 2023-03-08 NOTE — Assessment & Plan Note (Signed)
 Requested pccm to consult for possible ICU pt has taken macrobid yesterday and is DNR/DNI with living will in chart.

## 2023-03-08 NOTE — Assessment & Plan Note (Addendum)
 Blood pressure has been soft and culture are collected. We will follow WBC count and culture and consider abx. Repeat h/h. Vitals:   03/08/23 1041 03/08/23 1100 03/08/23 1115 03/08/23 1135  BP: (!) 98/43 (!) 92/41 (!) 89/37 (!) 98/44   03/08/23 1300 03/08/23 1400 03/08/23 1500 03/08/23 1515  BP: 107/61 101/69 (!) 94/50 (!) 88/54   03/08/23 1700 03/08/23 1745 03/08/23 1845 03/08/23 1919  BP: (!) 102/50 (!) 120/52 (!) 111/46 (!) 121/42

## 2023-03-08 NOTE — ED Triage Notes (Signed)
 Pt arrived via [facility] from home. Pt's family states pt has AMS and is weaker more than normal. Pt is A&Ox4.

## 2023-03-08 NOTE — Assessment & Plan Note (Signed)
Continue patient on 50 mcg of levothyroxine.

## 2023-03-08 NOTE — Assessment & Plan Note (Signed)
 No complaints of chest pain or anginal equivalent symptoms.  Cont Eliquis and atorvastatin.

## 2023-03-08 NOTE — Assessment & Plan Note (Addendum)
 Lab Results  Component Value Date   CREATININE 1.63 (H) 03/08/2023   CREATININE 1.12 (H) 02/20/2023   CREATININE 1.12 (H) 01/27/2023   Most likely from hypotension and hypoperfusion from reduced EF CHF. Will hold nephrotoxic agents meds avoid contrast and continue with gentle IV fluid hydration in light of patient's CHF history.   Strict I's and O's and daily weights.  sodium chloride  50 mL/hr at 03/08/23 1717   sodium chloride     No intake or output data in the 24 hours ending 03/08/23 1909

## 2023-03-08 NOTE — Assessment & Plan Note (Addendum)
 Patient's status post Medtronic pacemaker check in December.  Will interrogate as needed will currently monitor on telemetry. Cont eliquis.

## 2023-03-08 NOTE — Assessment & Plan Note (Addendum)
 Pt continued on gentle ivf hydration. Due to her soft to low BP even with holding her meds. Intact pth.  D/D include primary HyperPTH or secondary from CKD.

## 2023-03-08 NOTE — Consult Note (Signed)
 Cardiology Consultation   Patient ID: BELLARAE LIZER MRN: [id number]; DOB: 1924/07/18  Admit date: 03/08/2023 Date of Consult: 03/08/2023  PCP:  Corlis Pagan, NP   [facility] Providers Cardiologist:  Dorn Lesches, MD        Patient Profile:   Stephanie Atkinson is a independent 89 y.o. female with past medical history of coronary artery disease status post CABG, hypertension, hyperlipidemia, hypothyroidism, atrial fibrillation on Eliquis , symptomatic bradycardia status post PPM (Medtronic Azure XT SR implanted May 2019), chronic systolic heart failure comes to the ED today with generalized weakness and confusion. Hospitalists consulted for hypotension with concern for low-output heart failure.   History of Present Illness:   Stephanie Atkinson is a independent 89 y.o. female with past medical history of coronary artery disease status post CABG, hypertension, hyperlipidemia, hypothyroidism, atrial fibrillation on Eliquis , symptomatic bradycardia status post PPM (Medtronic Azure XT SR implanted May 2019), chronic systolic heart failure comes to the ED today with generalized weakness and confusion.  History obtained from patient and family.  Family reports that they brought inpatient for hypotension and generalized weakness.  They report she was in her usual state of health until last night when she developed a cough that lasts about 30 minutes and then resolved.  Yesterday she also started treatment for urinary tract infection and has taken 1 dose of nitrofurantoin .  She has a history of urinary tract infections that began with cloudy urine which was noted yesterday.  With her prior urinary tract infections when they are not treated she begins to have symptoms.  She also reports fevers and chills yesterday.  Denies any abdominal pain, nausea, vomiting, chest pain, lower extremity edema, orthopnea,, syncope.   Past Medical History:  Diagnosis Date   Chronic kidney disease, stage 3 (HCC)     Coronary artery disease    Hx of CABG 2006   LIMA-LAD, free RIMA-PDA, Radial-OM1-OM2   Hyperlipidemia    Hypertension    Hypothyroid    MI, old        PAF (paroxysmal atrial fibrillation) (HCC)    was on amio, d/c'd due to prolonged PR interval, rate control   Presence of permanent cardiac pacemaker 07/16/2017   Presence of stent in right coronary artery 07/12   RIMA-PDA occluded, DES stent placed   Syncope 07/12   bradycardic, beta blocker decreased, also felt to be dehydrated    Past Surgical History:  Procedure Laterality Date   CARDIAC CATHETERIZATION  101512   dr. alm harding, revealing a atretic bypass to her right with patent distal RCA stent   CAROTID STENT  2012   CORONARY ARTERY BYPASS GRAFT  2006   LIMA-LAD, free RIMA-PDA, Radial-OM1-OM2   DOPPLER ECHOCARDIOGRAPHY  [id number]   mild asymmetric left ventricular hypertrophy, left ventricular systolic function is low normal, ejection fraction = 50-55%, the LA is moderate dilated, the RA is mildly dilated, no significant valvular disease   INSERT / REPLACE / REMOVE PACEMAKER  07/16/2017   JOINT REPLACEMENT     hip replacement   LEFT HEART CATHETERIZATION WITH CORONARY/GRAFT ANGIOGRAM N/A 10/12/2012   Procedure: LEFT HEART CATHETERIZATION WITH EL BILE;  Surgeon: Alm LELON Clay, MD;  Location: [facility] C[facility] LAB;  Service: Cardiovascular;  Laterality: N/A;   LOOP RECORDER IMPLANT N/A 10/15/2012   Procedure: LOOP RECORDER IMPLANT;  Surgeon: Jerel Balding, MD;  Location: [facility] CATH LAB;  Service: Cardiovascular;  Laterality: N/A;   LOOP RECORDER REMOVAL  07/16/2017   LOOP RECORDER REMOVAL  N/A 07/16/2017   Procedure: LOOP RECORDER REMOVAL;  Surgeon: Francyne Headland, MD;  Location: [facility] INVASIVE CV LAB;  Service: Cardiovascular;  Laterality: N/A;   NM MYOVIEW  LTD  100510   post stress left ventricle is normal in size, post stress ejection fraction is 67% global left ventricular systolic function is normal, normal myocardial  perfusion study, abnormal myocardial perfusion study, low risk scan   PACEMAKER IMPLANT N/A 07/16/2017   Procedure: PACEMAKER IMPLANT;  Surgeon: Francyne Headland, MD;  Location: [facility] INVASIVE CV LAB;  Service: Cardiovascular;  Laterality: N/A;   THYROIDECTOMY         Inpatient Medications: Scheduled Meds:  apixaban   2.5 mg Oral BID   atorvastatin   80 mg Oral QHS   HYDROcodone -acetaminophen   1 tablet Oral See admin instructions   [START ON 03/09/2023] levothyroxine   50 mcg Oral QAC breakfast   pantoprazole  (PROTONIX ) IV  40 mg Intravenous Q24H   sodium chloride  flush  3 mL Intravenous Q12H   Continuous Infusions:  sodium chloride  50 mL/hr at 03/08/23 1717   [START ON 03/09/2023] azithromycin  (ZITHROMAX ) 500 mg in sodium chloride  0.9 % 250 mL IVPB     [START ON 03/09/2023] cefTRIAXone  (ROCEPHIN )  IV     PRN Meds: acetaminophen  **OR** acetaminophen   Allergies:    Allergies  Allergen Reactions   Penicillins Hives   Sulfa Antibiotics Hives and Swelling   Gabapentin  Other (See Comments)    Confusion   Lyrica [Pregabalin] Other (See Comments)    Confusion    Social History:   Social History   Socioeconomic History   Marital status: Single    Spouse name: Not on file   Number of children: Not on file   Years of education: Not on file   Highest education level: Not on file  Occupational History   Not on file  Tobacco Use   Smoking status: Never   Smokeless tobacco: Current    Types: Snuff   Tobacco comments:    01/06/2023 patient still use Snuff  Vaping Use   Vaping status: Never Used  Substance and Sexual Activity   Alcohol  use: No   Drug use: No   Sexual activity: Not on file  Other Topics Concern   Not on file  Social History Narrative   Not on file   Social Drivers of Health   Financial Resource Strain: Low Risk  (03/04/2023)   Overall Financial Resource Strain (CARDIA)    Difficulty of Paying Living Expenses: Not very hard  Food Insecurity: No Food Insecurity  (03/04/2023)   Hunger Vital Sign    Worried About Running Out of Food in the Last Year: Never true    Ran Out of Food in the Last Year: Never true  Transportation Needs: No Transportation Needs (03/04/2023)   PRAPARE - Administrator, Civil Service (Medical): No    Lack of Transportation (Non-Medical): No  Physical Activity: Not on file  Stress: Not on file  Social Connections: Unknown (12/18/2021)   Received from [facility]   Social Network    Social Network: Not on file  Intimate Partner Violence: Not At Risk (01/16/2023)   Humiliation, Afraid, Rape, and Kick questionnaire    Fear of Current or Ex-Partner: No    Emotionally Abused: No    Physically Abused: No    Sexually Abused: No    Family History:    Family History  Problem Relation Age of Onset   CAD Mother    CAD Maternal  Grandmother      ROS:  Please see the history of present illness.  All other ROS reviewed and negative.     Physical Exam/Data:   Vitals:   03/08/23 1845 03/08/23 1919 03/08/23 1935 03/08/23 1945  BP: (!) 111/46 (!) 121/42  117/75  Pulse:  (!) 58  (!) 57  Resp:  (!) 23  (!) 22  Temp:   98.7 F (37.1 C)   TempSrc:   Oral   SpO2:  97%  99%  Weight:      Height:       No intake or output data in the 24 hours ending 03/08/23 2035    03/08/2023   10:42 AM 02/20/2023   10:10 AM 02/06/2023   11:36 AM  Last 3 Weights  Weight (lbs) 128 lb 1.4 oz 128 lb 132 lb  Weight (kg) 58.1 kg 58.06 kg 59.875 kg     Body mass index is 23.43 kg/m.  General:  Well nourished, well developed, in no acute distress HEENT: normal Neck: no JVD Vascular: No carotid bruits; Distal pulses 2+ bilaterally Cardiac:  normal S1, S2; RRR; no murmur. WWP.  Lungs:  clear to auscultation bilaterally, no wheezing, rhonchi or rales  Abd: soft, nontender, no hepatomegaly  Ext: no edema Skin: warm and dry  Psych:  Normal affect   EKG:  The EKG was personally reviewed and demonstrates:  paced  rhythm  Relevant CV Studies: TTE 12/2022: 1. Abnormal (paradoxical) septal motion, consistent with RV pacemaker. Left ventricular ejection fraction, by estimation, is 35 to 40%. The  left ventricle has moderately decreased function. The left ventricle demonstrates global hypokinesis. There is mild left ventricular  hypertrophy. Left ventricular diastolic parameters are indeterminate.   2. Right ventricular systolic function is normal. The right ventricular  size is normal. There is normal pulmonary artery systolic pressure.   3. Left atrial size was severely dilated.   4. Right atrial size was severely dilated.   5. The mitral valve is degenerative. Trivial mitral valve regurgitation.  No evidence of mitral stenosis.   6. The aortic valve is tricuspid. Aortic valve regurgitation is trivial.  No aortic stenosis is present.   7. Pulmonic valve regurgitation is moderate.   Laboratory Data:  High Sensitivity Troponin:  No results for input(s): TROPONINIHS in the last 720 hours.   Chemistry Recent Labs  Lab 03/08/23 1100  NA 136  K 4.5  CL 108  CO2 21*  GLUCOSE 112*  BUN 26*  CREATININE 1.63*  CALCIUM  10.5*  GFRNONAA 29*  ANIONGAP 7    Recent Labs  Lab 03/08/23 1100  PROT 5.8*  ALBUMIN 3.2*  AST 26  ALT 27  ALKPHOS 53  BILITOT 1.9*   Lipids No results for input(s): CHOL, TRIG, HDL, LABVLDL, LDLCALC, CHOLHDL in the last 168 hours.  Hematology Recent Labs  Lab 03/08/23 1100  WBC 20.3*  RBC 4.14  HGB 12.2  HCT 38.2  MCV 92.3  MCH 29.5  MCHC 31.9  RDW 13.8  PLT 172   Thyroid  No results for input(s): TSH, FREET4 in the last 168 hours.  BNPNo results for input(s): BNP, PROBNP in the last 168 hours.  DDimer No results for input(s): DDIMER in the last 168 hours.   Radiology/Studies:  CT HEAD WO CONTRAST ( ) Result Date: 03/08/2023 CLINICAL DATA:  Altered mental status, diplopia, weakness EXAM: CT HEAD WITHOUT CONTRAST TECHNIQUE:  Contiguous axial images were obtained from the base of the skull through the vertex without intravenous  contrast. RADIATION DOSE REDUCTION: This exam was performed according to the departmental dose-optimization program which includes automated exposure control, adjustment of the mA and/or kV according to patient size and/or use of iterative reconstruction technique. COMPARISON:  01/16/2023 FINDINGS: Brain: No evidence of acute infarction, hemorrhage, mass, mass effect, or midline shift. No hydrocephalus or extra-axial fluid collection. Periventricular white matter changes, likely the sequela of chronic small vessel ischemic disease. Age related cerebral atrophy. Chronic lacunar infarcts in the left thalamus, left basal ganglia, and right cerebellum. Vascular: No hyperdense vessel. Atherosclerotic calcifications in the intracranial carotid and vertebral arteries. Skull: Negative for fracture or focal lesion. Sinuses/Orbits: No acute finding. Status post bilateral lens replacements. Other: Trace fluid in the right mastoid air cells. IMPRESSION: No acute intracranial process. Electronically Signed   By: Donald Campion M.D.   On: 03/08/2023 19:09   DG Chest 1 View Result Date: 03/08/2023 CLINICAL DATA:  Sepsis EXAM: CHEST  1 VIEW COMPARISON:  01/16/2023 FINDINGS: Left-sided implanted cardiac device remains in place. Stable cardiomegaly status post sternotomy and CABG. Aortic atherosclerosis. Increased bibasilar interstitial markings without lobar consolidation. No visible pleural effusion. No pneumothorax. IMPRESSION: Increased bibasilar interstitial markings. Findings may reflect atelectasis versus atypical/viral infection. Electronically Signed   By: Mabel Converse D.O.   On: 03/08/2023 11:36   CT Renal Stone Study Result Date: 03/08/2023 CLINICAL DATA:  Abdominal flank pain, stone suspected EXAM: CT ABDOMEN AND PELVIS WITHOUT CONTRAST TECHNIQUE: Multidetector CT imaging of the abdomen and pelvis was performed  following the standard protocol without IV contrast. RADIATION DOSE REDUCTION: This exam was performed according to the departmental dose-optimization program which includes automated exposure control, adjustment of the mA and/or kV according to patient size and/or use of iterative reconstruction technique. COMPARISON:  None Available. FINDINGS: Lower chest: Mild LEFT basilar atelectasis. Hepatobiliary: No focal hepatic lesion. Postcholecystectomy. No biliary dilatation. Pancreas: Pancreas is normal. No ductal dilatation. No pancreatic inflammation. Spleen: Normal spleen Adrenals/urinary tract: Adrenal glands are normal. No nephrolithiasis ureterolithiasis. No obstructive uropathy. No bladder calculi. Simple fluid attenuation cyst partially exophytic from the LEFT kidney measures 2.5 cm. Stomach/Bowel: Stomach, duodenum small-bowel normal. Appendix not identified. No secondary signs appendicitis. The colon and rectosigmoid colon are normal. Vascular/Lymphatic: Abdominal aorta is normal caliber with atherosclerotic calcification. There is no retroperitoneal or periportal lymphadenopathy. No pelvic lymphadenopathy. Reproductive: Uterus and adnexa unremarkable. Other: No free fluid. Musculoskeletal: RIGHT hip prosthetic.  No acute osseous findings. IMPRESSION: 1. No acute findings in the abdomen pelvis. 2. No nephrolithiasis ureterolithiasis or obstructive uropathy. 3. Left Bosniak I benign renal cyst measuring 2.5 cm. No follow-up imaging is recommended. JACR 2018 Feb; 264-273, Management of the Incidental Renal Mass on CT, [employer] 2021; 814-848, Bosniak Classification of Cystic Renal Masses, Version 2019. 4.  Aortic Atherosclerosis (ICD10-I70.0). Electronically Signed   By: Jackquline Boxer M.D.   On: 03/08/2023 11:33     Assessment and Plan:   Hypotension Patient reporting subjective fevers, chills, and concern for UTI with treatment that began 1/10 as an outpatient. Labs notable for elevated WBC to  20k, Cr elevated to 1.6, UA only with rare bacteria after taking dose of antibiotic. Blood cultures pending. Lactate elevated to 2.5 from 1.6 earlier in the day. CXR with infiltrates vs. atlectasis. Patient is WWP and euvolemic. At this time suspect hypotension driven by vasoplegia from sepsis and less likely cardiac cause.  -continue management of sepsis -if continued hypotension after fluid resuscitation and antibiotic treatment, recommend repeat limited TTE and if central access obtained central  venous saturation   ICM HFrEF  Prior TTE with LVEF 35-40%. Pt is on room air without evidence of volume overload, she is warm and well perfused without evidence of low output or CS.  -hold entresto , coreg , spironolactone , lasix  in setting of suspected sepsis  -previously stopped jardiance   CAD s/p CABG HLD HTN -hold eliquis , coreg  -continue atorvastatin  80 mg  Afib Symptomatic Bradycardia s/p PM -continue eliquis  -hold coreg  -maintain on telemetry    Risk Assessment/Risk Scores:          For questions or updates, please contact [facility] Please consult [email or web address] for contact info under    Signed, Paticia JAYSON Blanch, MD  03/08/2023 8:35 PM

## 2023-03-08 NOTE — Progress Notes (Signed)
 [facility] ED [facility] Liaison Note  This patient has a new hospice referral with her admission visit scheduled for Monday 1.13.25.   [facility] will continue to follow for any discharge planning needs and to coordinate hospice care.    Please don't hesitate to call with any Hospice related questions or concerns.    Eleanor Nail, LPN [facility] Liaison [phone]

## 2023-03-08 NOTE — Hospital Course (Addendum)
 89 y.o. female with past medical history  of  coronary artery disease status post CABG, hypertension, hyperlipidemia, hypothyroidism, atrial fibrillation on Eliquis , symptomatic bradycardia and syncope, status post PPM (Medtronic Azure XT SR implanted May 2019).  , chronic systolic heart failure on Entresto , carvedilol , spironolactone , Jardiance ,comes to the ED today with generalized weakness and confusion.  Patient was admitted in November 2024 with similar complaints for altered mental status hypercalcemia chronic systolic heart failure chronic kidney disease stage III, lower back pain, patient was also seen by cardiology in December 2024 for pacemaker check and jardiance  was d/c due to low blood pressure and pt has soft BP and hypercalcemia.  Per grandson this morning patient was altered when she was sitting and he was asking her to do something she gets staring into space.

## 2023-03-08 NOTE — Assessment & Plan Note (Deleted)
 Suspect due to hypoperfusion or TIA like presentation. We will obtain stat head ct noncontrast.

## 2023-03-08 NOTE — Assessment & Plan Note (Signed)
 Vitals:   03/08/23 1041 03/08/23 1100 03/08/23 1115 03/08/23 1135  BP: (!) 98/43 (!) 92/41 (!) 89/37 (!) 98/44   03/08/23 1300 03/08/23 1400 03/08/23 1500 03/08/23 1515  BP: 107/61 101/69 (!) 94/50 (!) 88/54  Due to patient's soft blood pressures we will currently hold her Entresto , Lasix , Aldactone , Coreg .

## 2023-03-08 NOTE — ED Notes (Signed)
 Pt assisted to bed side commode and back to bed without incident. One person assist. Alert and oriented at this time, at cognitive baseline of intermittent forgetfulness. Pt passed swallow screen, took oral medication without issue. MD made aware. New linin provided.

## 2023-03-08 NOTE — ED Notes (Signed)
 Pt repositioned in bed & warm blankets given.

## 2023-03-08 NOTE — ED Provider Notes (Signed)
 [place] [facility] Provider Note   CSN: [id number] Arrival date & time: 03/08/23  1025     History  Chief Complaint  Patient presents with   Weakness   Altered Mental Status    Stephanie Atkinson is a 89 y.o. female.  89 yo F with a chief complaints of not acting right.  This was reported by family.  Found to be this way this morning.  Patient told EMS that she was having some right side pain though tells me she is having pain that goes down her leg which sounds like a chronic issue.  She tells me she has been coughing quite a bit this morning.  Does not know if she has had a fever.  Denies any urinary symptoms.  Denies rash.   Weakness Altered Mental Status Associated symptoms: weakness        Home Medications Prior to Admission medications   Medication Sig Start Date End Date Taking? Authorizing Provider  acetaminophen  (TYLENOL ) 500 MG tablet Take 1 tablet (500 mg total) by mouth every 6 (six) hours as needed for moderate pain. Patient taking differently: Take 1,000 mg by mouth 3 (three) times daily. 06/27/19  Yes Ezenduka, Nkeiruka J, MD  atorvastatin  (LIPITOR ) 80 MG tablet Take 80 mg by mouth at bedtime.    Yes [provider]  carvedilol  (COREG ) 3.125 MG tablet Take 1 tablet (3.125 mg total) by mouth 2 (two) times daily. 04/10/22  Yes Court Dorn PARAS, MD  ELIQUIS  2.5 MG TABS tablet TAKE 1 TABLET BY MOUTH TWICE A DAY 09/26/22  Yes Court Dorn PARAS, MD  furosemide  (LASIX ) 20 MG tablet Take 1 tablet (20 mg total) by mouth daily. Take two tablets in case of weight gain 2 to 3 lbs in 24 hrs or 5 lbs in 7 days, until weight back to baseline. Patient taking differently: Take 20 mg by mouth See admin instructions. Take 20mg  by mouth on Tuesday, Thursday, Saturday, Sunday. 02/10/23  Yes Croitoru, Mihai, MD  furosemide  (LASIX ) 40 MG tablet Take 40 mg by mouth See admin instructions. Take 40mg  by mouth every Monday, Wednesday, and Friday.  01/23/23  Yes [provider]  HYDROcodone -acetaminophen  (NORCO/VICODIN) 5-325 MG tablet Take 1 tablet by mouth See admin instructions. Take 1 tablet by mouth twice a day. May take a third dose if needed for breakthrough pain. 03/03/23  Yes [provider]  levothyroxine  (SYNTHROID , LEVOTHROID) 50 MCG tablet Take 50 mcg by mouth daily before breakfast.    Yes [provider]  lidocaine  (LIDODERM ) 5 % Place 1 patch onto the skin daily. 02/25/23  Yes [provider]  nitrofurantoin , macrocrystal-monohydrate, (MACROBID ) 100 MG capsule Take 100 mg by mouth 2 (two) times daily. 03/03/23  Yes [provider]  Polyethyl Glycol-Propyl Glycol (SYSTANE OP) Place 1 drop into both eyes daily as needed (dry eye).   Yes [provider]  sacubitril -valsartan  (ENTRESTO ) 24-26 MG Take 1 tablet by mouth 2 (two) times daily. 02/06/23  Yes Croitoru, Mihai, MD  spironolactone  (ALDACTONE ) 25 MG tablet Take 1 tablet (25 mg total) by mouth daily. 01/22/23  Yes Court Dorn PARAS, MD  traMADol  (ULTRAM ) 50 MG tablet Take 50 mg by mouth every 8 (eight) hours as needed. Patient not taking: Reported on 03/08/2023 01/27/23   [provider]  VITAMIN D3 50 MCG (2000 UT) capsule Take 1 capsule by mouth once a week. Patient not taking: Reported on 03/08/2023 02/03/22   [provider]  Allergies    Penicillins, Sulfa antibiotics, Gabapentin , and Lyrica [pregabalin]    Review of Systems   Review of Systems  Neurological:  Positive for weakness.    Physical Exam Updated Vital Signs BP 101/69   Pulse (!) 59   Temp 98.3 F (36.8 C)   Resp 18   Ht 5' 2 (1.575 m)   Wt 58.1 kg   SpO2 100%   BMI 23.43 kg/m  Physical Exam Vitals and nursing note reviewed.  Constitutional:      General: She is not in acute distress.    Appearance: She is well-developed. She is not diaphoretic.  HENT:     Head: Normocephalic and atraumatic.  Eyes:     Pupils: Pupils  are equal, round, and reactive to light.  Cardiovascular:     Rate and Rhythm: Normal rate and regular rhythm.     Heart sounds: No murmur heard.    No friction rub. No gallop.  Pulmonary:     Effort: Pulmonary effort is normal.     Breath sounds: No wheezing or rales.  Abdominal:     General: There is no distension.     Palpations: Abdomen is soft.     Tenderness: There is no abdominal tenderness.  Musculoskeletal:        General: No tenderness.     Cervical back: Normal range of motion and neck supple.  Skin:    General: Skin is warm and dry.  Neurological:     Mental Status: She is alert and oriented to person, place, and time.  Psychiatric:        Behavior: Behavior normal.     ED Results / Procedures / Treatments   Labs (all labs ordered are listed, but only abnormal results are displayed) Labs Reviewed  COMPREHENSIVE METABOLIC PANEL - Abnormal; Notable for the following components:      Result Value   CO2 21 (*)    Glucose, Bld 112 (*)    BUN 26 (*)    Creatinine, Ser 1.63 (*)    Calcium  10.5 (*)    Total Protein 5.8 (*)    Albumin 3.2 (*)    Total Bilirubin 1.9 (*)    GFR, Estimated 29 (*)    All other components within normal limits  CBC WITH DIFFERENTIAL/PLATELET - Abnormal; Notable for the following components:   WBC 20.3 (*)    Neutro Abs 18.8 (*)    Lymphs Abs 0.6 (*)    Abs Immature Granulocytes 0.14 (*)    All other components within normal limits  PROTIME-INR - Abnormal; Notable for the following components:   Prothrombin Time 18.2 (*)    INR 1.5 (*)    All other components within normal limits  URINALYSIS, W/ REFLEX TO CULTURE (INFECTION SUSPECTED) - Abnormal; Notable for the following components:   Bacteria, UA RARE (*)    All other components within normal limits  I-STAT CG4 LACTIC ACID, ED - Abnormal; Notable for the following components:   Lactic Acid, Venous 2.5 (*)    All other components within normal limits  RESP PANEL BY RT-PCR (RSV,  FLU A&B, COVID)  RVPGX2  CULTURE, BLOOD (ROUTINE X 2)  CULTURE, BLOOD (ROUTINE X 2)  APTT  I-STAT CG4 LACTIC ACID, ED    EKG EKG Interpretation Date/Time:  Saturday March 08 2023 10:58:05 EST Ventricular Rate:  60 PR Interval:  192 QRS Duration:  164 QT Interval:  470 QTC Calculation: 470 R Axis:   268  Text  Interpretation: Sinus rhythm Left bundle branch block No significant change since last tracing Confirmed by Emil Share [phone]) on 03/08/2023 11:42:55 AM  Radiology DG Chest 1 View Result Date: 03/08/2023 CLINICAL DATA:  Sepsis EXAM: CHEST  1 VIEW COMPARISON:  01/16/2023 FINDINGS: Left-sided implanted cardiac device remains in place. Stable cardiomegaly status post sternotomy and CABG. Aortic atherosclerosis. Increased bibasilar interstitial markings without lobar consolidation. No visible pleural effusion. No pneumothorax. IMPRESSION: Increased bibasilar interstitial markings. Findings may reflect atelectasis versus atypical/viral infection. Electronically Signed   By: Mabel Converse D.O.   On: 03/08/2023 11:36   CT Renal Stone Study Result Date: 03/08/2023 CLINICAL DATA:  Abdominal flank pain, stone suspected EXAM: CT ABDOMEN AND PELVIS WITHOUT CONTRAST TECHNIQUE: Multidetector CT imaging of the abdomen and pelvis was performed following the standard protocol without IV contrast. RADIATION DOSE REDUCTION: This exam was performed according to the departmental dose-optimization program which includes automated exposure control, adjustment of the mA and/or kV according to patient size and/or use of iterative reconstruction technique. COMPARISON:  None Available. FINDINGS: Lower chest: Mild LEFT basilar atelectasis. Hepatobiliary: No focal hepatic lesion. Postcholecystectomy. No biliary dilatation. Pancreas: Pancreas is normal. No ductal dilatation. No pancreatic inflammation. Spleen: Normal spleen Adrenals/urinary tract: Adrenal glands are normal. No nephrolithiasis ureterolithiasis. No  obstructive uropathy. No bladder calculi. Simple fluid attenuation cyst partially exophytic from the LEFT kidney measures 2.5 cm. Stomach/Bowel: Stomach, duodenum small-bowel normal. Appendix not identified. No secondary signs appendicitis. The colon and rectosigmoid colon are normal. Vascular/Lymphatic: Abdominal aorta is normal caliber with atherosclerotic calcification. There is no retroperitoneal or periportal lymphadenopathy. No pelvic lymphadenopathy. Reproductive: Uterus and adnexa unremarkable. Other: No free fluid. Musculoskeletal: RIGHT hip prosthetic.  No acute osseous findings. IMPRESSION: 1. No acute findings in the abdomen pelvis. 2. No nephrolithiasis ureterolithiasis or obstructive uropathy. 3. Left Bosniak I benign renal cyst measuring 2.5 cm. No follow-up imaging is recommended. JACR 2018 Feb; 264-273, Management of the Incidental Renal Mass on CT, [employer] 2021; 814-848, Bosniak Classification of Cystic Renal Masses, Version 2019. 4.  Aortic Atherosclerosis (ICD10-I70.0). Electronically Signed   By: Jackquline Boxer M.D.   On: 03/08/2023 11:33    Procedures Procedures    Medications Ordered in ED Medications  sodium chloride  0.9 % bolus 1,000 mL (0 mLs Intravenous Stopped 03/08/23 1228)  cefTRIAXone  (ROCEPHIN ) 2 g in sodium chloride  0.9 % 100 mL IVPB (0 g Intravenous Stopped 03/08/23 1505)  azithromycin  (ZITHROMAX ) 500 mg in sodium chloride  0.9 % 250 mL IVPB (0 mg Intravenous Stopped 03/08/23 1505)    ED Course/ Medical Decision Making/ A&P                                 Medical Decision Making Amount and/or Complexity of Data Reviewed Labs: ordered. Radiology: ordered.  Risk Decision regarding hospitalization.   89 yo F with a chief complaints of altered mental status.  This was reported by family to EMS.  Patient tells me that she has been coughing a bit this morning.  Will obtain a chest x-ray UA lab work reassess.  Chest x-ray pendantly interpreted by me without  obvious focal obstructive pneumothorax.  The radiologist is concerned for possible pneumonia possibly viral or bacterial.  Patient with a significant leukocytosis with neutrophilic predominance.  Very mild bump in renal function.  UA without obvious infection.  Lactate elevated 2.5.  COVID flu and RSV are negative.  Patient's family is here and provides further history.  Tell me that she has been coughing today.  She was complaining of urinary symptoms over the past couple days and recently started some over-the-counter medication for it.  She was hallucinating this morning and so she was brought here for evaluation.  I discussed results with the patient and family.  Will start on IV antibiotics.  Will discuss with hospitalist.  The patients results and plan were reviewed and discussed.   Any x-rays performed were independently reviewed by myself.   Differential diagnosis were considered with the presenting HPI.  Medications  sodium chloride  0.9 % bolus 1,000 mL (0 mLs Intravenous Stopped 03/08/23 1228)  cefTRIAXone  (ROCEPHIN ) 2 g in sodium chloride  0.9 % 100 mL IVPB (0 g Intravenous Stopped 03/08/23 1505)  azithromycin  (ZITHROMAX ) 500 mg in sodium chloride  0.9 % 250 mL IVPB (0 mg Intravenous Stopped 03/08/23 1505)    Vitals:   03/08/23 1135 03/08/23 1259 03/08/23 1300 03/08/23 1400  BP: (!) 98/44  107/61 101/69  Pulse: (!) 59  (!) 59 (!) 59  Resp: 18  (!) 22 18  Temp:  98.6 F (37 C)  98.3 F (36.8 C)  TempSrc:      SpO2: 93%  96% 100%  Weight:      Height:        Final diagnoses:  Pneumonia of left lower lobe due to infectious organism    Admission/ observation were discussed with the admitting physician, patient and/or family and they are comfortable with the plan.         Final Clinical Impression(s) / ED Diagnoses Final diagnoses:  Pneumonia of left lower lobe due to infectious organism    Rx / DC Orders ED Discharge Orders     None         Emil Share,  DO 03/08/23 1518

## 2023-03-09 ENCOUNTER — Encounter (HOSPITAL_COMMUNITY): Payer: Self-pay | Admitting: Internal Medicine

## 2023-03-09 DIAGNOSIS — J189 Pneumonia, unspecified organism: Secondary | ICD-10-CM

## 2023-03-09 LAB — COMPREHENSIVE METABOLIC PANEL
ALT: 22 U/L (ref 0–44)
AST: 22 U/L (ref 15–41)
Albumin: 2.7 g/dL — ABNORMAL LOW (ref 3.5–5.0)
Alkaline Phosphatase: 45 U/L (ref 38–126)
Anion gap: 5 (ref 5–15)
BUN: 23 mg/dL (ref 8–23)
CO2: 19 mmol/L — ABNORMAL LOW (ref 22–32)
Calcium: 9.9 mg/dL (ref 8.9–10.3)
Chloride: 115 mmol/L — ABNORMAL HIGH (ref 98–111)
Creatinine, Ser: 1.22 mg/dL — ABNORMAL HIGH (ref 0.44–1.00)
GFR, Estimated: 41 mL/min — ABNORMAL LOW (ref >60)
Glucose, Bld: 87 mg/dL (ref 70–99)
Potassium: 4 mmol/L (ref 3.5–5.1)
Sodium: 139 mmol/L (ref 135–145)
Total Bilirubin: 1.1 mg/dL (ref 0.0–1.2)
Total Protein: 5.2 g/dL — ABNORMAL LOW (ref 6.5–8.1)

## 2023-03-09 LAB — HEMOGLOBIN AND HEMATOCRIT, BLOOD
HCT: 34.6 % — ABNORMAL LOW (ref 36.0–46.0)
Hemoglobin: 10.9 g/dL — ABNORMAL LOW (ref 12.0–15.0)

## 2023-03-09 LAB — CBC
HCT: 35.4 % — ABNORMAL LOW (ref 36.0–46.0)
Hemoglobin: 11.2 g/dL — ABNORMAL LOW (ref 12.0–15.0)
MCH: 29.4 pg (ref 26.0–34.0)
MCHC: 31.6 g/dL (ref 30.0–36.0)
MCV: 92.9 fL (ref 80.0–100.0)
Platelets: 154 10*3/uL (ref 150–400)
RBC: 3.81 MIL/uL — ABNORMAL LOW (ref 3.87–5.11)
RDW: 14.2 % (ref 11.5–15.5)
WBC: 10.7 10*3/uL — ABNORMAL HIGH (ref 4.0–10.5)
nRBC: 0 % (ref 0.0–0.2)

## 2023-03-09 MED ORDER — OXYCODONE HCL 5 MG PO TABS
5.0000 mg | ORAL_TABLET | ORAL | Status: AC
  Administered 2023-03-09: 5 mg via ORAL
  Filled 2023-03-09: qty 1

## 2023-03-09 MED ORDER — POLYETHYLENE GLYCOL 3350 17 G PO PACK
17.0000 g | PACK | Freq: Every day | ORAL | Status: DC | PRN
  Administered 2023-03-10: 17 g via ORAL
  Filled 2023-03-09: qty 1

## 2023-03-09 MED ORDER — HYDROCODONE-ACETAMINOPHEN 5-325 MG PO TABS
1.0000 | ORAL_TABLET | Freq: Two times a day (BID) | ORAL | Status: DC | PRN
  Administered 2023-03-10 – 2023-03-11 (×4): 1 via ORAL
  Filled 2023-03-09 (×4): qty 1

## 2023-03-09 MED ORDER — SACUBITRIL-VALSARTAN 24-26 MG PO TABS
1.0000 | ORAL_TABLET | Freq: Two times a day (BID) | ORAL | Status: DC
Start: 2023-03-09 — End: 2023-03-11
  Administered 2023-03-09 – 2023-03-11 (×5): 1 via ORAL
  Filled 2023-03-09 (×6): qty 1

## 2023-03-09 NOTE — Progress Notes (Signed)
 Progress Note  Patient Name: Stephanie Atkinson Date of Encounter: 03/09/2023  Primary Cardiologist: Dorn Lesches, MD   Subjective   Patient seen and examined at her bedside.  Inpatient Medications    Scheduled Meds:  apixaban   2.5 mg Oral BID   atorvastatin   80 mg Oral QHS   HYDROcodone -acetaminophen   1 tablet Oral See admin instructions   levothyroxine   50 mcg Oral QAC breakfast   pantoprazole  (PROTONIX ) IV  40 mg Intravenous Q24H   sodium chloride  flush  3 mL Intravenous Q12H   Continuous Infusions:  sodium chloride  50 mL/hr at 03/08/23 1717   azithromycin  (ZITHROMAX ) 500 mg in sodium chloride  0.9 % 250 mL IVPB     cefTRIAXone  (ROCEPHIN )  IV     PRN Meds: acetaminophen  **OR** acetaminophen    Vital Signs    Vitals:   03/09/23 0505 03/09/23 0700 03/09/23 0737 03/09/23 0800  BP: (!) 140/45 (!) 144/66  (!) 161/59  Pulse: 60 63 63 62  Resp: 18 (!) 26 (!) 25 (!) 25  Temp: 98.8 F (37.1 C)  98.2 F (36.8 C)   TempSrc:   Oral   SpO2: 98% 98% 100% 100%  Weight:      Height:       No intake or output data in the 24 hours ending 03/09/23 1045 Filed Weights   03/08/23 1042  Weight: 58.1 kg    Telemetry     - Personally Reviewed  ECG     - Personally Reviewed  Physical Exam     General: Comfortable Head: Atraumatic, normal size  Eyes: PEERLA, EOMI  Neck: Supple, normal JVD Cardiac: Normal S1, S2; RRR; no murmurs, rubs, or gallops Lungs: Clear to auscultation bilaterally Abd: Soft, nontender, no hepatomegaly  Ext: warm, no edema Musculoskeletal: No deformities, BUE and BLE strength normal and equal Skin: Warm and dry, no rashes   Neuro: Alert and oriented to person, place, time, and situation, CNII-XII grossly intact, no focal deficits  Psych: Normal mood and affect   Labs    Chemistry Recent Labs  Lab 03/08/23 1100 03/09/23 0444  NA 136 139  K 4.5 4.0  CL 108 115*  CO2 21* 19*  GLUCOSE 112* 87  BUN 26* 23  CREATININE 1.63* 1.22*  CALCIUM   10.5* 9.9  PROT 5.8* 5.2*  ALBUMIN 3.2* 2.7*  AST 26 22  ALT 27 22  ALKPHOS 53 45  BILITOT 1.9* 1.1  GFRNONAA 29* 41*  ANIONGAP 7 5     Hematology Recent Labs  Lab 03/08/23 1100 03/08/23 1925 03/09/23 0444  WBC 20.3*  --  10.7*  RBC 4.14  --  3.81*  HGB 12.2 10.9* 11.2*  HCT 38.2 34.6* 35.4*  MCV 92.3  --  92.9  MCH 29.5  --  29.4  MCHC 31.9  --  31.6  RDW 13.8  --  14.2  PLT 172  --  154    Cardiac EnzymesNo results for input(s): TROPONINI in the last 168 hours. No results for input(s): TROPIPOC in the last 168 hours.   BNPNo results for input(s): BNP, PROBNP in the last 168 hours.   DDimer No results for input(s): DDIMER in the last 168 hours.   Radiology    CT HEAD WO CONTRAST ( ) Result Date: 03/08/2023 CLINICAL DATA:  Altered mental status, diplopia, weakness EXAM: CT HEAD WITHOUT CONTRAST TECHNIQUE: Contiguous axial images were obtained from the base of the skull through the vertex without intravenous contrast. RADIATION DOSE REDUCTION: This exam was  performed according to the departmental dose-optimization program which includes automated exposure control, adjustment of the mA and/or kV according to patient size and/or use of iterative reconstruction technique. COMPARISON:  01/16/2023 FINDINGS: Brain: No evidence of acute infarction, hemorrhage, mass, mass effect, or midline shift. No hydrocephalus or extra-axial fluid collection. Periventricular white matter changes, likely the sequela of chronic small vessel ischemic disease. Age related cerebral atrophy. Chronic lacunar infarcts in the left thalamus, left basal ganglia, and right cerebellum. Vascular: No hyperdense vessel. Atherosclerotic calcifications in the intracranial carotid and vertebral arteries. Skull: Negative for fracture or focal lesion. Sinuses/Orbits: No acute finding. Status post bilateral lens replacements. Other: Trace fluid in the right mastoid air cells. IMPRESSION: No acute intracranial  process. Electronically Signed   By: Donald Campion M.D.   On: 03/08/2023 19:09   DG Chest 1 View Result Date: 03/08/2023 CLINICAL DATA:  Sepsis EXAM: CHEST  1 VIEW COMPARISON:  01/16/2023 FINDINGS: Left-sided implanted cardiac device remains in place. Stable cardiomegaly status post sternotomy and CABG. Aortic atherosclerosis. Increased bibasilar interstitial markings without lobar consolidation. No visible pleural effusion. No pneumothorax. IMPRESSION: Increased bibasilar interstitial markings. Findings may reflect atelectasis versus atypical/viral infection. Electronically Signed   By: Mabel Converse D.O.   On: 03/08/2023 11:36   CT Renal Stone Study Result Date: 03/08/2023 CLINICAL DATA:  Abdominal flank pain, stone suspected EXAM: CT ABDOMEN AND PELVIS WITHOUT CONTRAST TECHNIQUE: Multidetector CT imaging of the abdomen and pelvis was performed following the standard protocol without IV contrast. RADIATION DOSE REDUCTION: This exam was performed according to the departmental dose-optimization program which includes automated exposure control, adjustment of the mA and/or kV according to patient size and/or use of iterative reconstruction technique. COMPARISON:  None Available. FINDINGS: Lower chest: Mild LEFT basilar atelectasis. Hepatobiliary: No focal hepatic lesion. Postcholecystectomy. No biliary dilatation. Pancreas: Pancreas is normal. No ductal dilatation. No pancreatic inflammation. Spleen: Normal spleen Adrenals/urinary tract: Adrenal glands are normal. No nephrolithiasis ureterolithiasis. No obstructive uropathy. No bladder calculi. Simple fluid attenuation cyst partially exophytic from the LEFT kidney measures 2.5 cm. Stomach/Bowel: Stomach, duodenum small-bowel normal. Appendix not identified. No secondary signs appendicitis. The colon and rectosigmoid colon are normal. Vascular/Lymphatic: Abdominal aorta is normal caliber with atherosclerotic calcification. There is no retroperitoneal or  periportal lymphadenopathy. No pelvic lymphadenopathy. Reproductive: Uterus and adnexa unremarkable. Other: No free fluid. Musculoskeletal: RIGHT hip prosthetic.  No acute osseous findings. IMPRESSION: 1. No acute findings in the abdomen pelvis. 2. No nephrolithiasis ureterolithiasis or obstructive uropathy. 3. Left Bosniak I benign renal cyst measuring 2.5 cm. No follow-up imaging is recommended. JACR 2018 Feb; 264-273, Management of the Incidental Renal Mass on CT, [employer] 2021; 814-848, Bosniak Classification of Cystic Renal Masses, Version 2019. 4.  Aortic Atherosclerosis (ICD10-I70.0). Electronically Signed   By: Jackquline Boxer M.D.   On: 03/08/2023 11:33    Cardiac Studies   Echocardiogram  Patient Profile     89 y.o. female with history of coronary artery disease status post CABG, hypertension, hyperlipidemia, atrial fibrillation on Eliquis , symptomatic bradycardia status post pacemaker implantation  Assessment & Plan    Hypotension on presentation-blood pressure has improved she is not on elevated blood pressure side.  Suspect that the hypotension driven by her underlying infection.  She has been fluid resuscitated.  She is now more on the hypertensive side.  Will introduce her blood pressure lowering medications slowly.  Will start Entresto  24-26 mg twice daily.  Hopefully were able to add back her Coreg  in Aldactone .  Ischemic cardiomyopathy/heart failure  with reduced ejection fraction recent EF 35 to 40%.  Off of her guideline directed medical therapy due to hypotension.  As noted above start Entresto  today.  Hope to add Coreg  and Aldactone  during the course of her hospitalization.   Coronary artery disease -no angina  Hyperlipidemia-continue Lipitor    Paroxysmal atrial fibrillation, history of sinus bradycardia status post pacemaker.  Continue Eliquis , plan to restart her Coreg .  In sinus rhythm today.  Her infection is being treated by the primary team she is currently  on ceftriaxone  and azithromycin .      For questions or updates, please contact [facility] Please consult [email or web address] for contact info under Cardiology/STEMI.      Signed, Taven Strite, DO  03/09/2023, 10:45 AM

## 2023-03-09 NOTE — ED Notes (Signed)
 ED TO INPATIENT HANDOFF REPORT  ED Nurse Name and Phone #: Aloma RN [phone]  S Name/Age/Gender Stephanie Atkinson 89 y.o. female Room/Bed: 038C/038C  Code Status   Code Status: Limited: Do not attempt resuscitation (DNR) -DNR-LIMITED -Do Not Intubate/DNI   Home/SNF/Other Home Patient oriented to: self, place, time, and situation Is this baseline? Yes   Triage Complete: Triage complete  Chief Complaint AMS (altered mental status) [R41.82]  Triage Note Pt arrived via [facility] from home. Pt's family states pt has AMS and is weaker more than normal. Pt is A&Ox4.   Allergies Allergies  Allergen Reactions   Penicillins Hives   Sulfa Antibiotics Hives and Swelling   Gabapentin  Other (See Comments)    Confusion   Lyrica [Pregabalin] Other (See Comments)    Confusion    Level of Care/Admitting Diagnosis ED Disposition     ED Disposition  Admit   Condition  --   Comment  Hospital Area: [facility] [[id number]]  Level of Care: Telemetry Medical [104]  May admit patient to [facility] or [facility] if equivalent level of care is available:: Yes  Covid Evaluation: Asymptomatic - no recent exposure (last 10 days) testing not required  Diagnosis: AMS (altered mental status) [[id number]]  Admitting Physician: TOBIE MARIO LULLA DEDRA  Attending Physician: TOBIE MARIO LULLA DEDRA  Certification:: I certify this patient will need inpatient services for at least 2 midnights  Expected Medical Readiness: 03/10/2023          B Medical/Surgery History Past Medical History:  Diagnosis Date   Chronic kidney disease, stage 3 (HCC)    Coronary artery disease    History of UTI    Hx of CABG 2006   LIMA-LAD, free RIMA-PDA, Radial-OM1-OM2   Hyperlipidemia    Hypertension    Hypothyroid    MI, old        PAF (paroxysmal atrial fibrillation) (HCC)    was on amio, d/c'd due to prolonged PR interval, rate control   Presence of permanent cardiac pacemaker 07/16/2017    Presence of stent in right coronary artery 08/2010   RIMA-PDA occluded, DES stent placed   Syncope 08/2010   bradycardic, beta blocker decreased, also felt to be dehydrated   Past Surgical History:  Procedure Laterality Date   CARDIAC CATHETERIZATION  12/10/2010   dr. alm harding, revealing a atretic bypass to her right with patent distal RCA stent   CAROTID STENT  2012   CHOLECYSTECTOMY     CORONARY ARTERY BYPASS GRAFT  2006   LIMA-LAD, free RIMA-PDA, Radial-OM1-OM2   DOPPLER ECHOCARDIOGRAPHY  09/27/2010   mild asymmetric left ventricular hypertrophy, left ventricular systolic function is low normal, ejection fraction = 50-55%, the LA is moderate dilated, the RA is mildly dilated, no significant valvular disease   INSERT / REPLACE / REMOVE PACEMAKER  07/16/2017   LEFT HEART CATHETERIZATION WITH CORONARY/GRAFT ANGIOGRAM N/A 10/12/2012   Procedure: LEFT HEART CATHETERIZATION WITH EL BILE;  Surgeon: Alm LELON Clay, MD;  Location: [facility] CATH LAB;  Service: Cardiovascular;  Laterality: N/A;   LOOP RECORDER IMPLANT N/A 10/15/2012   Procedure: LOOP RECORDER IMPLANT;  Surgeon: Jerel Balding, MD;  Location: [facility] CATH LAB;  Service: Cardiovascular;  Laterality: N/A;   LOOP RECORDER REMOVAL  07/16/2017   LOOP RECORDER REMOVAL N/A 07/16/2017   Procedure: LOOP RECORDER REMOVAL;  Surgeon: Balding Jerel, MD;  Location: [facility] INVASIVE CV LAB;  Service: Cardiovascular;  Laterality: N/A;   NM MYOVIEW  LTD  11/29/2008   post  stress left ventricle is normal in size, post stress ejection fraction is 67% global left ventricular systolic function is normal, normal myocardial perfusion study, abnormal myocardial perfusion study, low risk scan   PACEMAKER IMPLANT N/A 07/16/2017   Procedure: PACEMAKER IMPLANT;  Surgeon: Francyne Headland, MD;  Location: [facility] INVASIVE CV LAB;  Service: Cardiovascular;  Laterality: N/A;   THYROIDECTOMY     TOTAL HIP ARTHROPLASTY Right      A IV  Location/Drains/Wounds Patient Lines/Drains/Airways Status     Active Line/Drains/Airways     Name Placement date Placement time Site Days   Peripheral IV 03/08/23 20 G 1 Anterior;Right Forearm 03/08/23  1100  Forearm  1            Intake/Output Last 24 hours No intake or output data in the 24 hours ending 03/09/23 1301  Labs/Imaging Results for orders placed or performed during the hospital encounter of 03/08/23 (from the past 48 hours)  Resp panel by RT-PCR (RSV, Flu A&B, Covid) Anterior Nasal Swab     Status: None   Collection Time: 03/08/23 10:46 AM   Specimen: Anterior Nasal Swab  Result Value Ref Range   SARS Coronavirus 2 by RT PCR NEGATIVE NEGATIVE   Influenza A by PCR NEGATIVE NEGATIVE   Influenza B by PCR NEGATIVE NEGATIVE    Comment: (NOTE) The Xpert Xpress SARS-CoV-2/FLU/RSV plus assay is intended as an aid in the diagnosis of influenza from Nasopharyngeal swab specimens and should not be used as a sole basis for treatment. Nasal washings and aspirates are unacceptable for Xpert Xpress SARS-CoV-2/FLU/RSV testing.  Fact Sheet for Patients: bloggercourse.com  Fact Sheet for Healthcare Providers: seriousbroker.it  This test is not yet approved or cleared by the [place]  FDA and has been authorized for detection and/or diagnosis of SARS-CoV-2 by FDA under an Emergency Use Authorization (EUA). This EUA will remain in effect (meaning this test can be used) for the duration of the COVID-19 declaration under Section 564(b)(1) of the Act, 21 U.S.C. section 360bbb-3(b)(1), unless the authorization is terminated or revoked.     Resp Syncytial Virus by PCR NEGATIVE NEGATIVE    Comment: (NOTE) Fact Sheet for Patients: bloggercourse.com  Fact Sheet for Healthcare Providers: seriousbroker.it  This test is not yet approved or cleared by the [place]  FDA  and has been authorized for detection and/or diagnosis of SARS-CoV-2 by FDA under an Emergency Use Authorization (EUA). This EUA will remain in effect (meaning this test can be used) for the duration of the COVID-19 declaration under Section 564(b)(1) of the Act, 21 U.S.C. section 360bbb-3(b)(1), unless the authorization is terminated or revoked.  Performed at [facility], [address] [place], [place] [address]   Blood Culture (routine x 2)     Status: None (Preliminary result)   Collection Time: 03/08/23 10:55 AM   Specimen: BLOOD  Result Value Ref Range   Specimen Description BLOOD RIGHT ANTECUBITAL    Special Requests      BOTTLES DRAWN AEROBIC AND ANAEROBIC Blood Culture adequate volume   Culture      NO GROWTH < 24 HOURS Performed at [facility], [address] [place], [place] [address]    Report Status PENDING   Comprehensive metabolic panel     Status: Abnormal   Collection Time: 03/08/23 11:00 AM  Result Value Ref Range   Sodium 136 135 - 145 mmol/L   Potassium 4.5 3.5 - 5.1 mmol/L   Chloride 108 98 - 111 mmol/L  CO2 21 (L) 22 - 32 mmol/L   Glucose, Bld 112 (H) 70 - 99 mg/dL    Comment: Glucose reference range applies only to samples taken after fasting for at least 8 hours.   BUN 26 (H) 8 - 23 mg/dL   Creatinine, Ser 8.36 (H) 0.44 - 1.00 mg/dL   Calcium  10.5 (H) 8.9 - 10.3 mg/dL   Total Protein 5.8 (L) 6.5 - 8.1 g/dL   Albumin 3.2 (L) 3.5 - 5.0 g/dL   AST 26 15 - 41 U/L   ALT 27 0 - 44 U/L   Alkaline Phosphatase 53 38 - 126 U/L   Total Bilirubin 1.9 (H) 0.0 - 1.2 mg/dL   GFR, Estimated 29 (L) >60 mL/min    Comment: (NOTE) Calculated using the CKD-EPI Creatinine Equation (2021)    Anion gap 7 5 - 15    Comment: Performed at [facility] Lab, [address] [place], [place] [address]  CBC with Differential     Status: Abnormal   Collection Time: 03/08/23 11:00 AM  Result Value Ref Range   WBC 20.3 (H) 4.0 - 10.5 K/uL   RBC 4.14 3.87 -  5.11 MIL/uL   Hemoglobin 12.2 12.0 - 15.0 g/dL   HCT 61.7 63.9 - 53.9 %   MCV 92.3 80.0 - 100.0 fL   MCH 29.5 26.0 - 34.0 pg   MCHC 31.9 30.0 - 36.0 g/dL   RDW 86.1 88.4 - 84.4 %   Platelets 172 150 - 400 K/uL   nRBC 0.0 0.0 - 0.2 %   Neutrophils Relative % 92 %   Neutro Abs 18.8 (H) 1.7 - 7.7 K/uL   Lymphocytes Relative 3 %   Lymphs Abs 0.6 (L) 0.7 - 4.0 K/uL   Monocytes Relative 4 %   Monocytes Absolute 0.8 0.1 - 1.0 K/uL   Eosinophils Relative 0 %   Eosinophils Absolute 0.0 0.0 - 0.5 K/uL   Basophils Relative 0 %   Basophils Absolute 0.0 0.0 - 0.1 K/uL   Immature Granulocytes 1 %   Abs Immature Granulocytes 0.14 (H) 0.00 - 0.07 K/uL    Comment: Performed at [facility], [address], [place], [place] [address]  Protime-INR     Status: Abnormal   Collection Time: 03/08/23 11:00 AM  Result Value Ref Range   Prothrombin Time 18.2 (H) 11.4 - 15.2 seconds   INR 1.5 (H) 0.8 - 1.2    Comment: (NOTE) INR goal varies based on device and disease states. Performed at [facility], [address] [place], [place] [address]   APTT     Status: None   Collection Time: 03/08/23 11:00 AM  Result Value Ref Range   aPTT 35 24 - 36 seconds    Comment: Performed at [facility], [address] [place], [place] [address]  Blood Culture (routine x 2)     Status: None (Preliminary result)   Collection Time: 03/08/23 11:00 AM   Specimen: BLOOD  Result Value Ref Range   Specimen Description BLOOD BLOOD RIGHT WRIST    Special Requests      BOTTLES DRAWN AEROBIC AND ANAEROBIC Blood Culture adequate volume   Culture      NO GROWTH < 24 HOURS Performed at [facility], [address] [place], [place] [address]    Report Status PENDING   I-Stat Lactic Acid, ED     Status: None   Collection Time: 03/08/23 11:08 AM  Result Value Ref Range   Lactic Acid, Venous 1.6 0.5 - 1.9 mmol/L  Procalcitonin     Status: None   Collection Time: 03/08/23 11:16 AM  Result Value  Ref Range   Procalcitonin 1.11 ng/mL    Comment:        Interpretation: PCT > 0.5 ng/mL and <= 2 ng/mL: Systemic infection (sepsis) is possible, but other conditions are known to elevate PCT as well. (NOTE)       Sepsis PCT Algorithm           Lower Respiratory Tract                                      Infection PCT Algorithm    ----------------------------     ----------------------------         PCT < 0.25 ng/mL                PCT < 0.10 ng/mL          Strongly encourage             Strongly discourage   discontinuation of antibiotics    initiation of antibiotics    ----------------------------     -----------------------------       PCT 0.25 - 0.50 ng/mL            PCT 0.10 - 0.25 ng/mL               OR       >80% decrease in PCT            Discourage initiation of                                            antibiotics      Encourage discontinuation           of antibiotics    ----------------------------     -----------------------------         PCT >= 0.50 ng/mL              PCT 0.26 - 0.50 ng/mL                AND       <80% decrease in PCT             Encourage initiation of                                             antibiotics       Encourage continuation           of antibiotics    ----------------------------     -----------------------------        PCT >= 0.50 ng/mL                  PCT > 0.50 ng/mL               AND         increase in PCT                  Strongly encourage  initiation of antibiotics    Strongly encourage escalation           of antibiotics                                     -----------------------------                                           PCT <= 0.25 ng/mL                                                 OR                                        > 80% decrease in PCT                                      Discontinue / Do not initiate                                              antibiotics  Performed at [facility], [address], [place], [place] [address]   Urinalysis, w/ Reflex to Culture (Infection Suspected) -Urine, Clean Catch     Status: Abnormal   Collection Time: 03/08/23 12:36 PM  Result Value Ref Range   Specimen Source URINE, CLEAN CATCH    Color, Urine YELLOW YELLOW   APPearance CLEAR CLEAR   Specific Gravity, Urine 1.009 1.005 - 1.030   pH 5.0 5.0 - 8.0   Glucose, UA NEGATIVE NEGATIVE mg/dL   Hgb urine dipstick NEGATIVE NEGATIVE   Bilirubin Urine NEGATIVE NEGATIVE   Ketones, ur NEGATIVE NEGATIVE mg/dL   Protein, ur NEGATIVE NEGATIVE mg/dL   Nitrite NEGATIVE NEGATIVE   Leukocytes,Ua NEGATIVE NEGATIVE   RBC / HPF 0-5 0 - 5 RBC/hpf   WBC, UA 0-5 0 - 5 WBC/hpf    Comment:        Reflex urine culture not performed if WBC <=10, OR if Squamous epithelial cells >5. If Squamous epithelial cells >5 suggest recollection.    Bacteria, UA RARE (A) NONE SEEN   Squamous Epithelial / HPF 0-5 0 - 5 /HPF   Hyaline Casts, UA PRESENT     Comment: Performed at [facility], [address] [place], [place] [address]  I-Stat Lactic Acid, ED     Status: Abnormal   Collection Time: 03/08/23 12:37 PM  Result Value Ref Range   Lactic Acid, Venous 2.5 (HH) 0.5 - 1.9 mmol/L   Comment NOTIFIED PHYSICIAN   Hemoglobin and hematocrit, blood     Status: Abnormal   Collection Time: 03/08/23  7:25 PM  Result Value Ref Range   Hemoglobin 10.9 (L) 12.0 - 15.0 g/dL   HCT 65.3 (L) 63.9 - 53.9 %    Comment: Performed at [facility], [address] [place],  [place] [address]  Comprehensive metabolic panel     Status: Abnormal   Collection Time: 03/09/23  4:44 AM  Result Value Ref Range   Sodium 139 135 - 145 mmol/L   Potassium 4.0 3.5 - 5.1 mmol/L   Chloride 115 (H) 98 - 111 mmol/L   CO2 19 (L) 22 - 32 mmol/L   Glucose, Bld 87 70 - 99 mg/dL    Comment: Glucose reference range applies only to samples taken after fasting for at least 8 hours.    BUN 23 8 - 23 mg/dL   Creatinine, Ser 8.77 (H) 0.44 - 1.00 mg/dL   Calcium  9.9 8.9 - 10.3 mg/dL   Total Protein 5.2 (L) 6.5 - 8.1 g/dL   Albumin 2.7 (L) 3.5 - 5.0 g/dL   AST 22 15 - 41 U/L   ALT 22 0 - 44 U/L   Alkaline Phosphatase 45 38 - 126 U/L   Total Bilirubin 1.1 0.0 - 1.2 mg/dL   GFR, Estimated 41 (L) >60 mL/min    Comment: (NOTE) Calculated using the CKD-EPI Creatinine Equation (2021)    Anion gap 5 5 - 15    Comment: Performed at [facility], [address], [place], [place] [address]  CBC     Status: Abnormal   Collection Time: 03/09/23  4:44 AM  Result Value Ref Range   WBC 10.7 (H) 4.0 - 10.5 K/uL   RBC 3.81 (L) 3.87 - 5.11 MIL/uL   Hemoglobin 11.2 (L) 12.0 - 15.0 g/dL   HCT 64.5 (L) 63.9 - 53.9 %   MCV 92.9 80.0 - 100.0 fL   MCH 29.4 26.0 - 34.0 pg   MCHC 31.6 30.0 - 36.0 g/dL   RDW 85.7 88.4 - 84.4 %   Platelets 154 150 - 400 K/uL   nRBC 0.0 0.0 - 0.2 %    Comment: Performed at [facility] Lab, [address] [place], [place] [address]   CT HEAD WO CONTRAST ( ) Result Date: 03/08/2023 CLINICAL DATA:  Altered mental status, diplopia, weakness EXAM: CT HEAD WITHOUT CONTRAST TECHNIQUE: Contiguous axial images were obtained from the base of the skull through the vertex without intravenous contrast. RADIATION DOSE REDUCTION: This exam was performed according to the departmental dose-optimization program which includes automated exposure control, adjustment of the mA and/or kV according to patient size and/or use of iterative reconstruction technique. COMPARISON:  01/16/2023 FINDINGS: Brain: No evidence of acute infarction, hemorrhage, mass, mass effect, or midline shift. No hydrocephalus or extra-axial fluid collection. Periventricular white matter changes, likely the sequela of chronic small vessel ischemic disease. Age related cerebral atrophy. Chronic lacunar infarcts in the left thalamus, left basal ganglia, and right cerebellum. Vascular: No hyperdense  vessel. Atherosclerotic calcifications in the intracranial carotid and vertebral arteries. Skull: Negative for fracture or focal lesion. Sinuses/Orbits: No acute finding. Status post bilateral lens replacements. Other: Trace fluid in the right mastoid air cells. IMPRESSION: No acute intracranial process. Electronically Signed   By: Donald Campion M.D.   On: 03/08/2023 19:09   DG Chest 1 View Result Date: 03/08/2023 CLINICAL DATA:  Sepsis EXAM: CHEST  1 VIEW COMPARISON:  01/16/2023 FINDINGS: Left-sided implanted cardiac device remains in place. Stable cardiomegaly status post sternotomy and CABG. Aortic atherosclerosis. Increased bibasilar interstitial markings without lobar consolidation. No visible pleural effusion. No pneumothorax. IMPRESSION: Increased bibasilar interstitial markings. Findings may reflect atelectasis versus atypical/viral infection. Electronically Signed   By: Mabel Converse D.O.   On: 03/08/2023 11:36  CT Renal Stone Study Result Date: 03/08/2023 CLINICAL DATA:  Abdominal flank pain, stone suspected EXAM: CT ABDOMEN AND PELVIS WITHOUT CONTRAST TECHNIQUE: Multidetector CT imaging of the abdomen and pelvis was performed following the standard protocol without IV contrast. RADIATION DOSE REDUCTION: This exam was performed according to the departmental dose-optimization program which includes automated exposure control, adjustment of the mA and/or kV according to patient size and/or use of iterative reconstruction technique. COMPARISON:  None Available. FINDINGS: Lower chest: Mild LEFT basilar atelectasis. Hepatobiliary: No focal hepatic lesion. Postcholecystectomy. No biliary dilatation. Pancreas: Pancreas is normal. No ductal dilatation. No pancreatic inflammation. Spleen: Normal spleen Adrenals/urinary tract: Adrenal glands are normal. No nephrolithiasis ureterolithiasis. No obstructive uropathy. No bladder calculi. Simple fluid attenuation cyst partially exophytic from the LEFT kidney  measures 2.5 cm. Stomach/Bowel: Stomach, duodenum small-bowel normal. Appendix not identified. No secondary signs appendicitis. The colon and rectosigmoid colon are normal. Vascular/Lymphatic: Abdominal aorta is normal caliber with atherosclerotic calcification. There is no retroperitoneal or periportal lymphadenopathy. No pelvic lymphadenopathy. Reproductive: Uterus and adnexa unremarkable. Other: No free fluid. Musculoskeletal: RIGHT hip prosthetic.  No acute osseous findings. IMPRESSION: 1. No acute findings in the abdomen pelvis. 2. No nephrolithiasis ureterolithiasis or obstructive uropathy. 3. Left Bosniak I benign renal cyst measuring 2.5 cm. No follow-up imaging is recommended. JACR 2018 Feb; 264-273, Management of the Incidental Renal Mass on CT, [employer] 2021; 814-848, Bosniak Classification of Cystic Renal Masses, Version 2019. 4.  Aortic Atherosclerosis (ICD10-I70.0). Electronically Signed   By: Jackquline Boxer M.D.   On: 03/08/2023 11:33    Pending Labs Unresulted Labs (From admission, onward)     Start     Ordered   03/08/23 1538  Parathyroid  hormone, intact (no Ca)  Add-on,   AD        03/08/23 1538            Vitals/Pain Today's Vitals   03/09/23 0700 03/09/23 0737 03/09/23 0800 03/09/23 1129  BP: (!) 144/66  (!) 161/59 (!) 142/78  Pulse: 63 63 62 63  Resp: (!) 26 (!) 25 (!) 25 (!) 23  Temp:  98.2 F (36.8 C)  (!) 97 F (36.1 C)  TempSrc:  Oral  Oral  SpO2: 98% 100% 100% 98%  Weight:      Height:      PainSc:   0-No pain     Isolation Precautions No active isolations  Medications Medications  atorvastatin  (LIPITOR ) tablet 80 mg (80 mg Oral Given 03/08/23 2228)  apixaban  (ELIQUIS ) tablet 2.5 mg (2.5 mg Oral Given 03/09/23 0927)  levothyroxine  (SYNTHROID ) tablet 50 mcg (50 mcg Oral Given 03/09/23 0639)  sodium chloride  flush (NS) 0.9 % injection 3 mL (3 mLs Intravenous Given 03/09/23 0927)  0.9 %  sodium chloride  infusion ( Intravenous New Bag/Given 03/08/23  1717)  acetaminophen  (TYLENOL ) tablet 650 mg (650 mg Oral Given 03/09/23 0422)    Or  acetaminophen  (TYLENOL ) suppository 650 mg ( Rectal See Alternative 03/09/23 0422)  pantoprazole  (PROTONIX ) injection 40 mg (40 mg Intravenous Given 03/08/23 1715)  HYDROcodone -acetaminophen  (NORCO/VICODIN) 5-325 MG per tablet 1 tablet (1 tablet Oral Given 03/08/23 1715)  cefTRIAXone  (ROCEPHIN ) 2 g in sodium chloride  0.9 % 100 mL IVPB (has no administration in time range)  azithromycin  (ZITHROMAX ) 500 mg in sodium chloride  0.9 % 250 mL IVPB (has no administration in time range)  sacubitril -valsartan  (ENTRESTO ) 24-26 mg per tablet (1 tablet Oral Given 03/09/23 1213)  sodium chloride  0.9 % bolus 1,000 mL (0 mLs Intravenous Stopped 03/08/23 1228)  cefTRIAXone  (ROCEPHIN ) 2 g in sodium chloride  0.9 % 100 mL IVPB (0 g Intravenous Stopped 03/08/23 1505)  azithromycin  (ZITHROMAX ) 500 mg in sodium chloride  0.9 % 250 mL IVPB (0 mg Intravenous Stopped 03/08/23 1505)  sodium chloride  0.9 % bolus 250 mL (0 mLs Intravenous Stopped 03/08/23 1931)    Mobility walks with person assist     Focused Assessments Pulmonary Assessment Handoff:  Lung sounds: Bilateral Breath Sounds: Diminished O2 Device: Room Air      R Recommendations: See Admitting Provider Note  Report given to:   Additional Notes: LL PNA

## 2023-03-09 NOTE — Plan of Care (Signed)

## 2023-03-09 NOTE — Progress Notes (Signed)
 Pt arrived to unit on RA, alert and oriented x4. Pt able to express needs and was educated on use of call and the rationale for bed alarm being ON. Pt agreeable. Female hat placed in bathroom pt instructed on its use and that urine would be sent off to be cultured.

## 2023-03-09 NOTE — ED Notes (Signed)
Pt assisted to bedside commode and back to bed without incident.

## 2023-03-09 NOTE — ED Notes (Signed)
Pt well appearing upon transport to floor.

## 2023-03-09 NOTE — Progress Notes (Signed)
  Progress Note   Patient: Stephanie Atkinson FMW:[id number] DOB: Jul 17, 1924 DOA: 03/08/2023     1 DOS: the patient was seen and examined on 03/09/2023   Brief hospital course: 89 y.o. female with past medical history  of  coronary artery disease status post CABG, hypertension, hyperlipidemia, hypothyroidism, atrial fibrillation on Eliquis , symptomatic bradycardia and syncope, status post PPM (Medtronic Azure XT SR implanted May 2019).  , chronic systolic heart failure on Entresto , carvedilol , spironolactone , Jardiance ,comes to the ED today with generalized weakness and confusion.  Patient was admitted in November 2024 with similar complaints for altered mental status hypercalcemia chronic systolic heart failure chronic kidney disease stage III, lower back pain, patient was also seen by cardiology in December 2024 for pacemaker check and jardiance  was d/c due to low blood pressure and pt has soft BP and hypercalcemia.  Per grandson this morning patient was altered when she was sitting and he was asking her to do something she gets staring into space.    Assessment and Plan: Toxic metabolic encephalopathy secondary to UTI with sepsis and septic shock present on admit Pt presented septic from UTI with sbp of 89, tachypnea with RR of 22, WBC 20.3k, and lactate of 2.5 in the setting of UTI -Pt is continued on empiric rocephin  -Urine culture was never ordered. Will order Mentation has greatly improved this AM following abx overnight   Acute kidney injury superimposed on chronic kidney disease (HCC) -Presenting Cr 1.63, baseline around 1.1 -Suspect related to hypovolemia and sepsis -Cr has improved to 1.22 -Entresto  resumed by Cardiology. Will f/u bmet trends    Symptomatic bradycardia s/p PPM  -Patient's status post Medtronic pacemaker check in December.   -Cont eliquis .    Hypercalcemia Normalized after hydration overnight   Hypothyroidism Continue patient on 50 mcg of levothyroxine . -recent TSH  of 0.943 in 11/24   Hypertension -Pt presented hypotensive with sbp in the 80's - Entresto , Lasix , Aldactone , Coreg  were initially held -BP has improved with hydration and abx -Cardiology has resumed entresto . Plan to gradually resume back bp meds as tolerated    CAD (coronary artery disease) of artery bypass graft No complaints of chest pain or anginal equivalent symptoms.  Cont Eliquis  and atorvastatin .  Subjective: Feels much better this AM. No longer confused  Physical Exam: Vitals:   03/09/23 1200 03/09/23 1300 03/09/23 1400 03/09/23 1547  BP: (!) 141/58 (!) 154/64 (!) 144/77 (!) 140/69  Pulse: 60 (!) 59 60 (!) 59  Resp: 19 16  20   Temp:      TempSrc:    Oral  SpO2: 100% 100% 98% 99%  Weight:      Height:       General exam: Awake, laying in bed, in nad Respiratory system: Normal respiratory effort, no wheezing Cardiovascular system: regular rate, s1, s2 Gastrointestinal system: Soft, nondistended, positive BS Central nervous system: CN2-12 grossly intact, strength intact Extremities: Perfused, no clubbing Skin: Normal skin turgor, no notable skin lesions seen Psychiatry: Mood normal // no visual hallucinations   Data Reviewed:  Labs reviewed: Na 139, Cr 1.22, WBC 10.7, Hgb 11.2, Plts 154  Family Communication: Pt in room, family not at bedside  Disposition: Status is: Inpatient Remains inpatient appropriate because: severity of illness  Planned Discharge Destination:  Pending PT eval    Author: Garnette Pelt, MD 03/09/2023 5:26 PM  For on call review [email or web address].

## 2023-03-10 DIAGNOSIS — J189 Pneumonia, unspecified organism: Secondary | ICD-10-CM | POA: Diagnosis not present

## 2023-03-10 DIAGNOSIS — I255 Ischemic cardiomyopathy: Secondary | ICD-10-CM

## 2023-03-10 LAB — COMPREHENSIVE METABOLIC PANEL
ALT: 30 U/L (ref 0–44)
AST: 35 U/L (ref 15–41)
Albumin: 2.6 g/dL — ABNORMAL LOW (ref 3.5–5.0)
Alkaline Phosphatase: 49 U/L (ref 38–126)
Anion gap: 5 (ref 5–15)
BUN: 18 mg/dL (ref 8–23)
CO2: 20 mmol/L — ABNORMAL LOW (ref 22–32)
Calcium: 10 mg/dL (ref 8.9–10.3)
Chloride: 115 mmol/L — ABNORMAL HIGH (ref 98–111)
Creatinine, Ser: 1.28 mg/dL — ABNORMAL HIGH (ref 0.44–1.00)
GFR, Estimated: 38 mL/min — ABNORMAL LOW (ref >60)
Glucose, Bld: 85 mg/dL (ref 70–99)
Potassium: 4 mmol/L (ref 3.5–5.1)
Sodium: 140 mmol/L (ref 135–145)
Total Bilirubin: 0.8 mg/dL (ref 0.0–1.2)
Total Protein: 5 g/dL — ABNORMAL LOW (ref 6.5–8.1)

## 2023-03-10 LAB — CBC
HCT: 34.1 % — ABNORMAL LOW (ref 36.0–46.0)
Hemoglobin: 11 g/dL — ABNORMAL LOW (ref 12.0–15.0)
MCH: 29.6 pg (ref 26.0–34.0)
MCHC: 32.3 g/dL (ref 30.0–36.0)
MCV: 91.7 fL (ref 80.0–100.0)
Platelets: 178 10*3/uL (ref 150–400)
RBC: 3.72 MIL/uL — ABNORMAL LOW (ref 3.87–5.11)
RDW: 14.3 % (ref 11.5–15.5)
WBC: 7.1 10*3/uL (ref 4.0–10.5)
nRBC: 0 % (ref 0.0–0.2)

## 2023-03-10 LAB — PARATHYROID HORMONE, INTACT (NO CA): PTH: 34 pg/mL (ref 15–65)

## 2023-03-10 MED ORDER — CARVEDILOL 3.125 MG PO TABS
3.1250 mg | ORAL_TABLET | Freq: Two times a day (BID) | ORAL | Status: DC
  Administered 2023-03-10 – 2023-03-11 (×2): 3.125 mg via ORAL
  Filled 2023-03-10 (×2): qty 1

## 2023-03-10 MED ORDER — SPIRONOLACTONE 25 MG PO TABS
25.0000 mg | ORAL_TABLET | Freq: Every day | ORAL | Status: DC
  Administered 2023-03-10 – 2023-03-11 (×2): 25 mg via ORAL
  Filled 2023-03-10 (×2): qty 1

## 2023-03-10 MED ORDER — AZITHROMYCIN 500 MG PO TABS
250.0000 mg | ORAL_TABLET | Freq: Every day | ORAL | Status: DC
Start: 2023-03-11 — End: 2023-03-13
  Administered 2023-03-11: 250 mg via ORAL
  Filled 2023-03-10: qty 1

## 2023-03-10 NOTE — Progress Notes (Signed)
  Progress Note   Patient: Stephanie Atkinson FMW:[id number] DOB: 02-27-1924 DOA: 03/08/2023     2 DOS: the patient was seen and examined on 03/10/2023   Brief hospital course: 89 y.o. female with past medical history  of  coronary artery disease status post CABG, hypertension, hyperlipidemia, hypothyroidism, atrial fibrillation on Eliquis , symptomatic bradycardia and syncope, status post PPM (Medtronic Azure XT SR implanted May 2019).  , chronic systolic heart failure on Entresto , carvedilol , spironolactone , Jardiance ,comes to the ED today with generalized weakness and confusion.  Patient was admitted in November 2024 with similar complaints for altered mental status hypercalcemia chronic systolic heart failure chronic kidney disease stage III, lower back pain, patient was also seen by cardiology in December 2024 for pacemaker check and jardiance  was d/c due to low blood pressure and pt has soft BP and hypercalcemia.  Per grandson this morning patient was altered when she was sitting and he was asking her to do something she gets staring into space.    Assessment and Plan: Toxic metabolic encephalopathy secondary to UTI with sepsis and septic shock present on admit Pt presented septic from UTI with sbp of 89, tachypnea with RR of 22, WBC 20.3k, and lactate of 2.5 in the setting of UTI -Pt is continued on empiric rocephin  -Urine culture was never ordered. Was ordered after abx was already started, thus urine cx results may not be accurate Mentation has greatly improved   Acute kidney injury superimposed on chronic kidney disease (HCC) -Presenting Cr 1.63, baseline around 1.1 -Suspect related to hypovolemia and sepsis -Entresto  resumed by Cardiology. Coreg  and aldactone  added today -Recheck bmet in AM    Symptomatic bradycardia s/p PPM  -Patient's status post Medtronic pacemaker check in December.   -Cont eliquis .    Hypercalcemia Normalized after hydration    Hypothyroidism Continue patient on  50 mcg of levothyroxine . -recent TSH of 0.943 in 11/24   Hypertension -Pt presented hypotensive with sbp in the 80's - Entresto , Lasix , Aldactone , Coreg  were initially held -BP has improved with hydration and abx and is now on the hypertensive side -Cardiology has resumed entresto . Coreg  and aldactone  restarted today per Cardiology   CAD (coronary artery disease) of artery bypass graft No complaints of chest pain or anginal equivalent symptoms.  Cont Eliquis  and atorvastatin .  Subjective: Reports feeling much better. Eager to go home soon  Physical Exam: Vitals:   03/10/23 0351 03/10/23 0712 03/10/23 0827 03/10/23 1207  BP: (!) 157/71  (!) 181/67 (!) 158/74  Pulse: 86   67  Resp: 18  20 20   Temp: 98 F (36.7 C)  (!) 97.5 F (36.4 C)   TempSrc:   Oral   SpO2: 96%  98% 100%  Weight:  57.8 kg  57.2 kg  Height:       General exam: Conversant, in no acute distress Respiratory system: normal chest rise, clear, no audible wheezing Cardiovascular system: regular rhythm, s1-s2 Gastrointestinal system: Nondistended, nontender, pos BS Central nervous system: No seizures, no tremors Extremities: No cyanosis, no joint deformities Skin: No rashes, no pallor Psychiatry: Affect normal // no auditory hallucinations   Data Reviewed:  Labs reviewed: Na 140, k 4.0, Cr 1.28, WBC 7.1, hgb 11.0  Family Communication: Pt in room, family not at bedside  Disposition: Status is: Inpatient Remains inpatient appropriate because: severity of illness  Planned Discharge Destination: Home    Author: Garnette Pelt, MD 03/10/2023 2:27 PM  For on call review [email or web address].

## 2023-03-10 NOTE — Progress Notes (Signed)
 Mobility Specialist: Progress Note   03/10/23 1557  Mobility  Activity Ambulated with assistance in room  Level of Assistance Contact guard assist, steadying assist  Assistive Device Front wheel walker  Distance Ambulated (ft) 15 ft  Activity Response Tolerated well  Mobility Referral Yes  Mobility visit 1 Mobility  Mobility Specialist Start Time (ACUTE ONLY) 1520  Mobility Specialist Stop Time (ACUTE ONLY) 1534  Mobility Specialist Time Calculation (min) (ACUTE ONLY) 14 min    Pt needed to return to bed - received in bathroom. MinG throughout, SV for bed mobility. No complaints. Left in bed with all needs met, call bell in reach.   Ileana Lute Mobility Specialist Please contact via SecureChat or Rehab office at [phone]

## 2023-03-10 NOTE — Plan of Care (Signed)

## 2023-03-10 NOTE — Progress Notes (Signed)
 Rounding Note    Patient Name: Stephanie Atkinson Date of Encounter: 03/10/2023  [facility] Cardiologist: Dorn Lesches, MD   Subjective   No CP or dyspnea  Inpatient Medications    Scheduled Meds:  apixaban   2.5 mg Oral BID   atorvastatin   80 mg Oral QHS   levothyroxine   50 mcg Oral QAC breakfast   pantoprazole  (PROTONIX ) IV  40 mg Intravenous Q24H   sacubitril -valsartan   1 tablet Oral BID   sodium chloride  flush  3 mL Intravenous Q12H   Continuous Infusions:  sodium chloride  50 mL/hr at 03/09/23 2116   azithromycin  (ZITHROMAX ) 500 mg in sodium chloride  0.9 % 250 mL IVPB Stopped (03/09/23 1708)   cefTRIAXone  (ROCEPHIN )  IV Stopped (03/09/23 1447)   PRN Meds: acetaminophen  **OR** acetaminophen , HYDROcodone -acetaminophen , polyethylene glycol   Vital Signs    Vitals:   03/09/23 2050 03/10/23 0351 03/10/23 0712 03/10/23 0827  BP: (!) 179/60 (!) 157/71  (!) 181/67  Pulse: (!) 59 86    Resp: 18 18  20   Temp: (!) 97.5 F (36.4 C) 98 F (36.7 C)  (!) 97.5 F (36.4 C)  TempSrc:    Oral  SpO2: 100% 96%  98%  Weight:   57.8 kg   Height:        Intake/Output Summary (Last 24 hours) at 03/10/2023 0932 Last data filed at 03/09/2023 1447 Gross per 24 hour  Intake 97.38 ml  Output --  Net 97.38 ml      03/10/2023    7:12 AM 03/08/2023   10:42 AM 02/20/2023   10:10 AM  Last 3 Weights  Weight (lbs) 127 lb 6.8 oz 128 lb 1.4 oz 128 lb  Weight (kg) 57.8 kg 58.1 kg 58.06 kg      Telemetry    Ventricular paced with underlying atrial fibrillation- Personally Reviewed  Physical Exam   GEN: No acute distress.   Neck: No JVD Cardiac: RRR, no murmurs, rubs, or gallops.  Respiratory: Clear to auscultation bilaterally. GI: Soft, nontender, non-distended  MS: No edema Neuro:  Nonfocal  Psych: Normal affect   Labs     Chemistry Recent Labs  Lab 03/08/23 1100 03/09/23 0444 03/10/23 0540  NA 136 139 140  K 4.5 4.0 4.0  CL 108 115* 115*  CO2 21* 19* 20*   GLUCOSE 112* 87 85  BUN 26* 23 18  CREATININE 1.63* 1.22* 1.28*  CALCIUM  10.5* 9.9 10.0  PROT 5.8* 5.2* 5.0*  ALBUMIN 3.2* 2.7* 2.6*  AST 26 22 35  ALT 27 22 30   ALKPHOS 53 45 49  BILITOT 1.9* 1.1 0.8  GFRNONAA 29* 41* 38*  ANIONGAP 7 5 5      Hematology Recent Labs  Lab 03/08/23 1100 03/08/23 1925 03/09/23 0444 03/10/23 0540  WBC 20.3*  --  10.7* 7.1  RBC 4.14  --  3.81* 3.72*  HGB 12.2 10.9* 11.2* 11.0*  HCT 38.2 34.6* 35.4* 34.1*  MCV 92.3  --  92.9 91.7  MCH 29.5  --  29.4 29.6  MCHC 31.9  --  31.6 32.3  RDW 13.8  --  14.2 14.3  PLT 172  --  154 178    Radiology    CT HEAD WO CONTRAST ( ) Result Date: 03/08/2023 CLINICAL DATA:  Altered mental status, diplopia, weakness EXAM: CT HEAD WITHOUT CONTRAST TECHNIQUE: Contiguous axial images were obtained from the base of the skull through the vertex without intravenous contrast. RADIATION DOSE REDUCTION: This exam was performed according to the  departmental dose-optimization program which includes automated exposure control, adjustment of the mA and/or kV according to patient size and/or use of iterative reconstruction technique. COMPARISON:  01/16/2023 FINDINGS: Brain: No evidence of acute infarction, hemorrhage, mass, mass effect, or midline shift. No hydrocephalus or extra-axial fluid collection. Periventricular white matter changes, likely the sequela of chronic small vessel ischemic disease. Age related cerebral atrophy. Chronic lacunar infarcts in the left thalamus, left basal ganglia, and right cerebellum. Vascular: No hyperdense vessel. Atherosclerotic calcifications in the intracranial carotid and vertebral arteries. Skull: Negative for fracture or focal lesion. Sinuses/Orbits: No acute finding. Status post bilateral lens replacements. Other: Trace fluid in the right mastoid air cells. IMPRESSION: No acute intracranial process. Electronically Signed   By: Donald Campion M.D.   On: 03/08/2023 19:09   DG Chest 1  View Result Date: 03/08/2023 CLINICAL DATA:  Sepsis EXAM: CHEST  1 VIEW COMPARISON:  01/16/2023 FINDINGS: Left-sided implanted cardiac device remains in place. Stable cardiomegaly status post sternotomy and CABG. Aortic atherosclerosis. Increased bibasilar interstitial markings without lobar consolidation. No visible pleural effusion. No pneumothorax. IMPRESSION: Increased bibasilar interstitial markings. Findings may reflect atelectasis versus atypical/viral infection. Electronically Signed   By: Mabel Converse D.O.   On: 03/08/2023 11:36   CT Renal Stone Study Result Date: 03/08/2023 CLINICAL DATA:  Abdominal flank pain, stone suspected EXAM: CT ABDOMEN AND PELVIS WITHOUT CONTRAST TECHNIQUE: Multidetector CT imaging of the abdomen and pelvis was performed following the standard protocol without IV contrast. RADIATION DOSE REDUCTION: This exam was performed according to the departmental dose-optimization program which includes automated exposure control, adjustment of the mA and/or kV according to patient size and/or use of iterative reconstruction technique. COMPARISON:  None Available. FINDINGS: Lower chest: Mild LEFT basilar atelectasis. Hepatobiliary: No focal hepatic lesion. Postcholecystectomy. No biliary dilatation. Pancreas: Pancreas is normal. No ductal dilatation. No pancreatic inflammation. Spleen: Normal spleen Adrenals/urinary tract: Adrenal glands are normal. No nephrolithiasis ureterolithiasis. No obstructive uropathy. No bladder calculi. Simple fluid attenuation cyst partially exophytic from the LEFT kidney measures 2.5 cm. Stomach/Bowel: Stomach, duodenum small-bowel normal. Appendix not identified. No secondary signs appendicitis. The colon and rectosigmoid colon are normal. Vascular/Lymphatic: Abdominal aorta is normal caliber with atherosclerotic calcification. There is no retroperitoneal or periportal lymphadenopathy. No pelvic lymphadenopathy. Reproductive: Uterus and adnexa  unremarkable. Other: No free fluid. Musculoskeletal: RIGHT hip prosthetic.  No acute osseous findings. IMPRESSION: 1. No acute findings in the abdomen pelvis. 2. No nephrolithiasis ureterolithiasis or obstructive uropathy. 3. Left Bosniak I benign renal cyst measuring 2.5 cm. No follow-up imaging is recommended. JACR 2018 Feb; 264-273, Management of the Incidental Renal Mass on CT, [employer] 2021; 814-848, Bosniak Classification of Cystic Renal Masses, Version 2019. 4.  Aortic Atherosclerosis (ICD10-I70.0). Electronically Signed   By: Jackquline Boxer M.D.   On: 03/08/2023 11:33    Patient Profile     89 y.o. female with past medical history of coronary artery disease status post coronary bypass and graft, hypertension, hyperlipidemia, atrial fibrillation, prior pacemaker placement, chronic systolic congestive heart failure being evaluated for CHF.  Patient admitted with UTI and hypotension.  Echocardiogram November 2024 showed ejection fraction 35 to 40%, mild left ventricular hypertrophy, severe biatrial enlargement, trace aortic insufficiency.  Assessment & Plan    1 hypotension-resolved.  Felt secondary to underlying infectious process.  Entresto  has been reinitiated.  Will also reinitiate home dose of carvedilol  and Aldactone .  2 ischemic cardiomyopathy-as outlined above we are reinitiating Entresto , carvedilol  and Aldactone .  Given recurrent urinary tract infections would not  initiate SGLT2 inhibitor.  3 atrial fibrillation-heart rate is controlled.  Continue apixaban .  4 coronary artery disease-continue statin.  No aspirin  given need for apixaban .  5 history of pacemaker  6 UTI-antibiotics per primary care.  For questions or updates, please contact [facility] Please consult [email or web address] for contact info under        Signed, Redell Shallow, MD  03/10/2023, 9:32 AM

## 2023-03-10 NOTE — Evaluation (Signed)
 Occupational Therapy Evaluation Patient Details Name: Stephanie Atkinson MRN: [id number] DOB: Jun 27, 1924 Today's Date: 03/10/2023   History of Present Illness Pt is a 89 y.o. female admitted 1/11 with AMS. PMH:  HTN, HLD, CAD s/p CABG, paroxysmal atrial fibrillation, hypothyroidism, CKD stage 3, MI, syncope   Clinical Impression   Pt typically walks with a rollator, showers with supervision and is able to prepare a simple meal and complete light housekeeping. Her daughter gets her groceries for her and her family assists with IADLs. Pt is overall functioning at a set up to supervision level with RW, primarily for safety and IV line management. Educated in use of reacher and to dress L LE first and in use of sock aid. Pt returned demonstration and reports these methods will make LB dressing easier. No further OT needs.       If plan is discharge home, recommend the following: Assistance with cooking/housework;A little help with bathing/dressing/bathroom    Functional Status Assessment  Patient has not had a recent decline in their functional status  Equipment Recommendations  None recommended by OT    Recommendations for Other Services       Precautions / Restrictions Precautions Precautions: None Precaution Comments: Pt reports no hx of falls. Restrictions Weight Bearing Restrictions Per Provider Order: No      Mobility Bed Mobility Overal bed mobility: Modified Independent                  Transfers Overall transfer level: Modified independent Equipment used: Rolling walker (2 wheels)                      Balance Overall balance assessment: Needs assistance   Sitting balance-Leahy Scale: Good       Standing balance-Leahy Scale: Fair Standing balance comment: static stand without UE support, RW for amb                           ADL either performed or assessed with clinical judgement   ADL Overall ADL's : Needs  assistance/impaired Eating/Feeding: Independent   Grooming: Supervision/safety;Standing;Wash/dry hands   Upper Body Bathing: Supervision/ safety;Sitting   Lower Body Bathing: Supervison/ safety;Sit to/from stand   Upper Body Dressing : Set up;Sitting   Lower Body Dressing: Supervision/safety;Sitting/lateral leans Lower Body Dressing Details (indicate cue type and reason): educated in use of sock aid Toilet Transfer: Supervision/safety;Ambulation;Rolling walker (2 wheels)           Functional mobility during ADLs: Supervision/safety;Rolling walker (2 wheels)       Vision Ability to See in Adequate Light: 0 Adequate Patient Visual Report: No change from baseline       Perception         Praxis         Pertinent Vitals/Pain Pain Assessment Pain Assessment: Faces Faces Pain Scale: Hurts little more Pain Location: L knee Pain Descriptors / Indicators: Radiating Pain Intervention(s): Monitored during session, Repositioned     Extremity/Trunk Assessment Upper Extremity Assessment Upper Extremity Assessment: Overall WFL for tasks assessed   Lower Extremity Assessment Lower Extremity Assessment: Defer to PT evaluation   Cervical / Trunk Assessment Cervical / Trunk Assessment: Kyphotic   Communication Communication Communication: Hearing impairment   Cognition Arousal: Alert Behavior During Therapy: WFL for tasks assessed/performed Overall Cognitive Status: Within Functional Limits for tasks assessed  General Comments  VSS on RA    Exercises     Shoulder Instructions      Home Living Family/patient expects to be discharged to:: Private residence Living Arrangements: Alone Available Help at Discharge: Family;Available PRN/intermittently Type of Home: Apartment Home Access: Elevator     Home Layout: One level     Bathroom Shower/Tub: Producer, Television/film/video: Handicapped height      Home Equipment: Grab bars - tub/shower;Grab bars - toilet;Shower seat;Cane - single point;Rollator (4 wheels);Cane - Programmer, Applications (2 wheels);Adaptive equipment Adaptive Equipment: Reacher;Long-handled shoe horn        Prior Functioning/Environment Prior Level of Function : Needs assist             Mobility Comments: uses rollator for mobility, no falls ADLs Comments: drives, does own ADLs (sponge bathing). if showering, pt will have daughter or friend nearby, assisted, daughter brings groceries        OT Problem List:        OT Treatment/Interventions:      OT Goals(Current goals can be found in the care plan section)    OT Frequency:      Co-evaluation              AM-PAC OT 6 Clicks Daily Activity     Outcome Measure Help from another person eating meals?: None Help from another person taking care of personal grooming?: None Help from another person toileting, which includes using toliet, bedpan, or urinal?: None Help from another person bathing (including washing, rinsing, drying)?: None Help from another person to put on and taking off regular upper body clothing?: None Help from another person to put on and taking off regular lower body clothing?: None 6 Click Score: 24   End of Session Equipment Utilized During Treatment: Rolling walker (2 wheels);Gait belt  Activity Tolerance: Patient tolerated treatment well Patient left: in bed;with call bell/phone within reach  OT Visit Diagnosis: Unsteadiness on feet (R26.81)                Time: 9057-8993 OT Time Calculation (min): 24 min Charges:  OT General Charges $OT Visit: 1 Visit OT Evaluation $OT Eval Low Complexity: 1 Low OT Treatments $Self Care/Home Management : 8-22 mins Stephanie Atkinson, OTR/L Acute Rehabilitation Services Office: [phone]  Kennth Stephanie Helling 03/10/2023, 11:38 AM

## 2023-03-10 NOTE — Evaluation (Signed)
 Physical Therapy Evaluation Patient Details Name: Stephanie Atkinson MRN: [id number] DOB: [birth year] Today's Date: 03/10/2023  History of Present Illness  Pt is a 89 y.o. female admitted 1/11 with AMS. PMH:  HTN, HLD, CAD s/p CABG, paroxysmal atrial fibrillation, hypothyroidism, CKD stage 3, MI, syncope   Clinical Impression  PT eval complete. Pt from ILF where she amb with rollator mod I. She reports being d/c'd from HHPT last week. She demo mod I bed mobility and transfers. Supervision amb 160' with RW. Good sitting balance and fair standing balance. Pt at baseline for mobility. No further skilled PT intervention indicated. Will refer to mobility team for further mobility if pt remains inpatient.         If plan is discharge home, recommend the following:     Can travel by private vehicle        Equipment Recommendations None recommended by PT  Recommendations for Other Services       Functional Status Assessment Patient has not had a recent decline in their functional status     Precautions / Restrictions Precautions Precautions: None Precaution Comments: Pt reports no hx of falls.      Mobility  Bed Mobility Overal bed mobility: Modified Independent                  Transfers Overall transfer level: Modified independent                      Ambulation/Gait Ambulation/Gait assistance: Supervision Gait Distance (Feet): 160 Feet Assistive device: Rolling walker (2 wheels) Gait Pattern/deviations: Step-through pattern, Decreased stride length Gait velocity: WFL Gait velocity interpretation: 1.31 - 2.62 ft/sec, indicative of limited community ambulator   General Gait Details: steady gait with RW  Stairs            Wheelchair Mobility     Tilt Bed    Modified Rankin (Stroke Patients Only)       Balance Overall balance assessment: Needs assistance Sitting-balance support: No upper extremity supported, Feet supported Sitting balance-Leahy  Scale: Good Sitting balance - Comments: sitting EOB eating breakfast   Standing balance support: Bilateral upper extremity supported, During functional activity Standing balance-Leahy Scale: Fair Standing balance comment: static stand without UE support, RW for amb                             Pertinent Vitals/Pain Pain Assessment Pain Assessment: 0-10 Pain Score: 5  Pain Location: L knee (sciatica) Pain Descriptors / Indicators: Sore Pain Intervention(s): Monitored during session    Home Living Family/patient expects to be discharged to:: Private residence Living Arrangements: Alone Available Help at Discharge: Family;Available PRN/intermittently Type of Home: Independent living facility Home Access: Elevator       Home Layout: One level Home Equipment: Grab bars - tub/shower;Grab bars - toilet;Shower seat;Cane - single point;Rollator (4 wheels);Cane - Programmer[employer] (2 wheels)      Prior Function Prior Level of Function : Independent/Modified Independent;Driving             Mobility Comments: uses rollator for mobility, no falls ADLs Comments: drives, does own ADLs (sponge bathing). if showering, pt will have daughter come by. does own household chores and cooking.     Extremity/Trunk Assessment   Upper Extremity Assessment Upper Extremity Assessment: Defer to OT evaluation    Lower Extremity Assessment Lower Extremity Assessment: Overall WFL for tasks assessed    Cervical / Trunk  Assessment Cervical / Trunk Assessment: Kyphotic  Communication   Communication Communication: No apparent difficulties  Cognition Arousal: Alert Behavior During Therapy: WFL for tasks assessed/performed Overall Cognitive Status: Within Functional Limits for tasks assessed                                          General Comments General comments (skin integrity, edema, etc.): VSS on RA    Exercises     Assessment/Plan    PT Assessment  Patient does not need any further PT services  PT Problem List         PT Treatment Interventions      PT Goals (Current goals can be found in the Care Plan section)  Acute Rehab PT Goals Patient Stated Goal: back to ILF today PT Goal Formulation: All assessment and education complete, DC therapy    Frequency       Co-evaluation               AM-PAC PT 6 Clicks Mobility  Outcome Measure Help needed turning from your back to your side while in a flat bed without using bedrails?: None Help needed moving from lying on your back to sitting on the side of a flat bed without using bedrails?: None Help needed moving to and from a bed to a chair (including a wheelchair)?: None Help needed standing up from a chair using your arms (e.g., wheelchair or bedside chair)?: None Help needed to walk in hospital room?: A Little Help needed climbing 3-5 steps with a railing? : A Little 6 Click Score: 22    End of Session Equipment Utilized During Treatment: Gait belt Activity Tolerance: Patient tolerated treatment well Patient left: in bed;with call bell/phone within reach Nurse Communication: Mobility status PT Visit Diagnosis: Difficulty in walking, not elsewhere classified (R26.2)    Time: 9186-9167 PT Time Calculation (min) (ACUTE ONLY): 19 min   Charges:   PT Evaluation $PT Eval Low Complexity: 1 Low   PT General Charges $$ ACUTE PT VISIT: 1 Visit         Sari MATSU., PT  Office # [phone]   Erven Sari Shaker 03/10/2023, 9:26 AM

## 2023-03-11 ENCOUNTER — Other Ambulatory Visit (HOSPITAL_COMMUNITY): Payer: Self-pay

## 2023-03-11 DIAGNOSIS — J189 Pneumonia, unspecified organism: Secondary | ICD-10-CM | POA: Diagnosis not present

## 2023-03-11 DIAGNOSIS — I255 Ischemic cardiomyopathy: Secondary | ICD-10-CM | POA: Diagnosis not present

## 2023-03-11 LAB — BASIC METABOLIC PANEL WITH GFR
Anion gap: 7 (ref 5–15)
BUN: 12 mg/dL (ref 8–23)
CO2: 20 mmol/L — ABNORMAL LOW (ref 22–32)
Calcium: 10.2 mg/dL (ref 8.9–10.3)
Chloride: 112 mmol/L — ABNORMAL HIGH (ref 98–111)
Creatinine, Ser: 1.08 mg/dL — ABNORMAL HIGH (ref 0.44–1.00)
GFR, Estimated: 47 mL/min — ABNORMAL LOW
Glucose, Bld: 99 mg/dL (ref 70–99)
Potassium: 4.1 mmol/L (ref 3.5–5.1)
Sodium: 139 mmol/L (ref 135–145)

## 2023-03-11 LAB — URINE CULTURE: Culture: NO GROWTH

## 2023-03-11 MED ORDER — AZITHROMYCIN 250 MG PO TABS
250.0000 mg | ORAL_TABLET | Freq: Every day | ORAL | 0 refills | Status: AC
  Filled 2023-03-11: qty 3, 3d supply, fill #0

## 2023-03-11 MED ORDER — POLYETHYLENE GLYCOL 3350 17 G PO PACK
17.0000 g | PACK | Freq: Every day | ORAL | Status: DC
  Administered 2023-03-11: 17 g via ORAL
  Filled 2023-03-11: qty 1

## 2023-03-11 MED ORDER — BISACODYL 10 MG RE SUPP
10.0000 mg | Freq: Once | RECTAL | Status: AC
  Administered 2023-03-11: 10 mg via RECTAL
  Filled 2023-03-11: qty 1

## 2023-03-11 MED ORDER — CEFDINIR 300 MG PO CAPS
300.0000 mg | ORAL_CAPSULE | Freq: Two times a day (BID) | ORAL | 0 refills | Status: AC
  Filled 2023-03-11: qty 6, 3d supply, fill #0

## 2023-03-11 NOTE — Progress Notes (Signed)
 Rounding Note    Patient Name: Stephanie Atkinson Date of Encounter: 03/11/2023  [facility] Cardiologist: Dorn Lesches, MD   Subjective   Pt denies CP or dyspnea  Inpatient Medications    Scheduled Meds:  apixaban   2.5 mg Oral BID   atorvastatin   80 mg Oral QHS   azithromycin   250 mg Oral Daily   carvedilol   3.125 mg Oral BID WC   levothyroxine   50 mcg Oral QAC breakfast   pantoprazole  (PROTONIX ) IV  40 mg Intravenous Q24H   sacubitril -valsartan   1 tablet Oral BID   sodium chloride  flush  3 mL Intravenous Q12H   spironolactone   25 mg Oral Daily   Continuous Infusions:  cefTRIAXone  (ROCEPHIN )  IV 2 g (03/10/23 1419)   PRN Meds: acetaminophen  **OR** acetaminophen , HYDROcodone -acetaminophen , polyethylene glycol   Vital Signs    Vitals:   03/10/23 2004 03/11/23 0059 03/11/23 0645 03/11/23 0758  BP: 131/66 117/66 (!) 141/59 (!) 179/64  Pulse: 61 70 (!) 59 (!) 59  Resp: 18 18 18 16   Temp: 99.1 F (37.3 C) 97.6 F (36.4 C) (!) 97.4 F (36.3 C)   TempSrc: Oral Oral Oral   SpO2: 100% 99% 96% 100%  Weight:      Height:       No intake or output data in the 24 hours ending 03/11/23 0913     03/10/2023   12:07 PM 03/10/2023    7:12 AM 03/08/2023   10:42 AM  Last 3 Weights  Weight (lbs) 126 lb 1.7 oz 127 lb 6.8 oz 128 lb 1.4 oz  Weight (kg) 57.2 kg 57.8 kg 58.1 kg      Telemetry    Ventricular paced with underlying atrial fibrillation- Personally Reviewed  Physical Exam   GEN: NAD  Neck: supple Cardiac: RRR Respiratory: CTA GI: Soft, NT/ND MS: No edema Neuro:  Grossly intact Psych: Normal affect   Labs     Chemistry Recent Labs  Lab 03/08/23 1100 03/09/23 0444 03/10/23 0540  NA 136 139 140  K 4.5 4.0 4.0  CL 108 115* 115*  CO2 21* 19* 20*  GLUCOSE 112* 87 85  BUN 26* 23 18  CREATININE 1.63* 1.22* 1.28*  CALCIUM  10.5* 9.9 10.0  PROT 5.8* 5.2* 5.0*  ALBUMIN 3.2* 2.7* 2.6*  AST 26 22 35  ALT 27 22 30   ALKPHOS 53 45 49  BILITOT  1.9* 1.1 0.8  GFRNONAA 29* 41* 38*  ANIONGAP 7 5 5      Hematology Recent Labs  Lab 03/08/23 1100 03/08/23 1925 03/09/23 0444 03/10/23 0540  WBC 20.3*  --  10.7* 7.1  RBC 4.14  --  3.81* 3.72*  HGB 12.2 10.9* 11.2* 11.0*  HCT 38.2 34.6* 35.4* 34.1*  MCV 92.3  --  92.9 91.7  MCH 29.5  --  29.4 29.6  MCHC 31.9  --  31.6 32.3  RDW 13.8  --  14.2 14.3  PLT 172  --  154 178     Patient Profile     89 y.o. female with past medical history of coronary artery disease status post coronary bypass and graft, hypertension, hyperlipidemia, atrial fibrillation, prior pacemaker placement, chronic systolic congestive heart failure being evaluated for CHF.  Patient admitted with UTI and hypotension.  Echocardiogram November 2024 showed ejection fraction 35 to 40%, mild left ventricular hypertrophy, severe biatrial enlargement, trace aortic insufficiency.  Assessment & Plan    1 hypotension-resolved.  Felt secondary to underlying infectious process.  Entresto , carvedilol   and Aldactone  have been resumed and blood pressure is tolerating well.  Would continue.  2 ischemic cardiomyopathy-Entresto , carvedilol  and Aldactone  resumed.  Given recurrent urinary tract infections would not initiate SGLT2 inhibitor.  3 atrial fibrillation-heart rate is controlled.  Continue apixaban .  4 coronary artery disease-continue statin.  No aspirin  given need for apixaban .  5 history of pacemaker  6 UTI-antibiotics per primary care.  Cardiology will sign off.  Would continue present medications at discharge.  We will arrange follow-up with APP 2 to 4 weeks following discharge.  For questions or updates, please contact [facility] Please consult [email or web address] for contact info under        Signed, Redell Shallow, MD  03/11/2023, 9:13 AM

## 2023-03-11 NOTE — Progress Notes (Signed)
 Transition of Care [facility]) - Inpatient Brief Assessment   Patient Details  Name: Stephanie Atkinson MRN: [id number] Date of Birth: 02-22-1925  [facility] CM/SW Contact:    Stephanie JONELLE Joe, RN Phone Number: 03/11/2023, 10:36 AM   Clinical Narrative: CM spoke with the patient's daughter, Stephanie Atkinson this morning and discussed that patient was medically stable for discharge.  The daughter was concerned that patient would not remember to take her antibiotics for UTI when she returned home.  I offered information regarding personal care services in town that could provide extra care in the home outside of [facility].  I spoke with Stephanie Atkinson, CM with [facility] and they plan to follow up with the patient/daughter in the home to start services for home hospice.  DME at the home includes Rolator.  No other TOC needs.  The patient's daughter plans to pick her up from the hospital today for discharge by car to home.   Transition of Care Asessment: Insurance and Status: (P) Insurance coverage has been reviewed Patient has primary care physician: (P) Yes Home environment has been reviewed: (P) from home alone Prior level of function:: (P) family assistance Prior/Current Home Services: (P) No current home services Social Drivers of Health Review: (P) SDOH reviewed needs interventions Readmission risk has been reviewed: (P) Yes Transition of care needs: (P) transition of care needs identified, TOC will continue to follow

## 2023-03-11 NOTE — Progress Notes (Signed)
 Mobility Specialist Progress Note:   03/11/23 1015  Mobility  Activity Dangled on edge of bed  Level of Assistance Standby assist, set-up cues, supervision of patient - no hands on  Activity Response Tolerated well  Mobility Referral Yes  Mobility visit 1 Mobility  Mobility Specialist Start Time (ACUTE ONLY) 1015  Mobility Specialist Stop Time (ACUTE ONLY) 1020  Mobility Specialist Time Calculation (min) (ACUTE ONLY) 5 min   Pt received in bed, declined ambulation in room or hallway d/t pain. Also declined sitting up in chair d/t just transferring back to bed. Pt agreeable to sit EOB, no physical assistance required. Left pt in bed with all needs met, will f/u to ambulate when time permits.   Lawton Dollinger Mobility Specialist Please contact via Special Educational Needs Teacher or  Rehab office at [phone]

## 2023-03-11 NOTE — Care Management Important Message (Signed)
 Important Message  Patient Details  Name: Stephanie Atkinson MRN: [id number] Date of Birth: 06-19-1924   Important Message Given:  Yes - Medicare IM     Dorena Bodo 03/11/2023, 1:34 PM

## 2023-03-11 NOTE — Discharge Summary (Signed)
 Physician Discharge Summary   Patient: Stephanie Atkinson MRN: [id number] DOB: 1924-04-26  Admit date:     03/08/2023  Discharge date: 03/11/23  Discharge Physician: Garnette Pelt   PCP: Corlis Pagan, NP   Recommendations at discharge:    Follow up with PCP in 1-2 weeks Follow up with [facility] as scheduled  Discharge Diagnoses: Principal Problem:   AMS (altered mental status) Active Problems:   Acute kidney injury superimposed on chronic kidney disease (HCC)   Symptomatic bradycardia s/p PPM    Hypercalcemia   Hypothyroidism   Hypertension   CAD (coronary artery disease) of artery bypass graft   Hypotension   Sepsis (HCC)   Urinary tract infection without hematuria  Resolved Problems:   * No resolved hospital problems. *  Hospital Course: 89 y.o. female with past medical history  of  coronary artery disease status post CABG, hypertension, hyperlipidemia, hypothyroidism, atrial fibrillation on Eliquis , symptomatic bradycardia and syncope, status post PPM (Medtronic Azure XT SR implanted May 2019).  , chronic systolic heart failure on Entresto , carvedilol , spironolactone , Jardiance ,comes to the ED today with generalized weakness and confusion.  Patient was admitted in November 2024 with similar complaints for altered mental status hypercalcemia chronic systolic heart failure chronic kidney disease stage III, lower back pain, patient was also seen by cardiology in December 2024 for pacemaker check and jardiance  was d/c due to low blood pressure and pt has soft BP and hypercalcemia.  Per grandson this morning patient was altered when she was sitting and he was asking her to do something she gets staring into space.    Assessment and Plan: Toxic metabolic encephalopathy secondary to UTI with sepsis and septic shock present on admit Pt presented septic from UTI with sbp of 89, tachypnea with RR of 22, WBC 20.3k, and lactate of 2.5 in the setting of UTI -Pt is continued on empiric rocephin   with azithromycin  -Urine culture was never ordered. Was ordered after abx was already started, thus urine cx results are likely not accurate Mentation has greatly improved to baseline   Acute kidney injury superimposed on chronic kidney disease (HCC) -Presenting Cr 1.63, baseline around 1.1 -Suspect related to hypovolemia and sepsis -Entresto  resumed by Cardiology. Coreg  and aldactone  added today -renal function normalized    Symptomatic bradycardia s/p PPM  -Patient's status post Medtronic pacemaker check in December.   -Cont eliquis .    Hypercalcemia Normalized after hydration    Hypothyroidism Continue patient on 50 mcg of levothyroxine . -recent TSH of 0.943 in 11/24   Hypertension -Pt presented hypotensive with sbp in the 80's - Entresto , Lasix , Aldactone , Coreg  were initially held -BP has improved with hydration and abx and is now on the hypertensive side -Cardiology has resumed entresto . Coreg  and aldactone  restarted on 1/13 per Cardiology. Pt tolerated well   CAD (coronary artery disease) of artery bypass graft No complaints of chest pain or anginal equivalent symptoms.  Cont Eliquis  and atorvastatin .    Consultants: Cardiology, Critical Care Procedures performed:   Disposition: Home Diet recommendation:  Cardiac diet DISCHARGE MEDICATION: Allergies as of 03/11/2023       Reactions   Penicillins Hives   Sulfa Antibiotics Hives, Swelling   Gabapentin  Other (See Comments)   Confusion   Lyrica [pregabalin] Other (See Comments)   Confusion        Medication List     STOP taking these medications    nitrofurantoin  (macrocrystal-monohydrate) 100 MG capsule Commonly known as: MACROBID    traMADol  50 MG tablet Commonly known  as: ULTRAM    Vitamin D3 50 MCG (2000 UT) capsule       TAKE these medications    acetaminophen  500 MG tablet Commonly known as: TYLENOL  Take 1 tablet (500 mg total) by mouth every 6 (six) hours as needed for moderate pain. What  changed:  how much to take when to take this   azithromycin  250 MG tablet Commonly known as: ZITHROMAX  Take 1 tablet (250 mg total) by mouth daily for 3 days. Start taking on: March 12, 2023   carvedilol  3.125 MG tablet Commonly known as: COREG  Take 1 tablet (3.125 mg total) by mouth 2 (two) times daily.   cefdinir  300 MG capsule Commonly known as: OMNICEF  Take 1 capsule (300 mg total) by mouth 2 (two) times daily for 3 days.   Eliquis  2.5 MG Tabs tablet Generic drug: apixaban  TAKE 1 TABLET BY MOUTH TWICE A DAY   Entresto  24-26 MG Generic drug: sacubitril -valsartan  Take 1 tablet by mouth 2 (two) times daily.   furosemide  40 MG tablet Commonly known as: LASIX  Take 40 mg by mouth See admin instructions. Take 40mg  by mouth every Monday, Wednesday, and Friday. What changed: Another medication with the same name was changed. Make sure you understand how and when to take each.   furosemide  20 MG tablet Commonly known as: LASIX  Take 1 tablet (20 mg total) by mouth daily. Take two tablets in case of weight gain 2 to 3 lbs in 24 hrs or 5 lbs in 7 days, until weight back to baseline. What changed:  when to take this additional instructions   HYDROcodone -acetaminophen  5-325 MG tablet Commonly known as: NORCO/VICODIN Take 1 tablet by mouth See admin instructions. Take 1 tablet by mouth twice a day. May take a third dose if needed for breakthrough pain.   levothyroxine  50 MCG tablet Commonly known as: SYNTHROID  Take 50 mcg by mouth daily before breakfast.   lidocaine  5 % Commonly known as: LIDODERM  Place 1 patch onto the skin daily.   Lipitor  80 MG tablet Generic drug: atorvastatin  Take 80 mg by mouth at bedtime.   spironolactone  25 MG tablet Commonly known as: ALDACTONE  Take 1 tablet (25 mg total) by mouth daily.   SYSTANE OP Place 1 drop into both eyes daily as needed (dry eye).        Follow-up Information     Janene Boer, [place] Follow up on 04/11/2023.    Specialties: Cardiology, Radiology Why: @3 :10PM. Cardiology follow up Contact information: [address] [place] [address] [phone]         [facility] Follow up.   Specialty: Hospice and Palliative Medicine Why: [facility] will follow up in the home to provide home hospice services. Contact information: [address] [facility] [place]  [address] [phone]        Corlis Pagan, NP Follow up in 2 week(s).   Why: Hospital follow up Contact information: [address] [place] [address] [place] [address] [phone]                Discharge Exam: Fredricka Weights   03/08/23 1042 03/10/23 9287 03/10/23 1207  Weight: 58.1 kg 57.8 kg 57.2 kg   General exam: Awake, laying in bed, in nad Respiratory system: Normal respiratory effort, no wheezing Cardiovascular system: regular rate, s1, s2 Gastrointestinal system: Soft, nondistended, positive BS Central nervous system: CN2-12 grossly intact, strength intact Extremities: Perfused, no clubbing Skin: Normal skin turgor, no notable skin lesions seen Psychiatry: Mood normal // no visual hallucinations  Condition at discharge: fair  The results of significant diagnostics from this hospitalization (including imaging, microbiology, ancillary and laboratory) are listed below for reference.   Imaging Studies: CT HEAD WO CONTRAST ( ) Result Date: 03/08/2023 CLINICAL DATA:  Altered mental status, diplopia, weakness EXAM: CT HEAD WITHOUT CONTRAST TECHNIQUE: Contiguous axial images were obtained from the base of the skull through the vertex without intravenous contrast. RADIATION DOSE REDUCTION: This exam was performed according to the departmental dose-optimization program which includes automated exposure control, adjustment of the mA and/or kV according to patient size and/or use of iterative reconstruction technique. COMPARISON:  01/16/2023 FINDINGS: Brain: No evidence of acute infarction,  hemorrhage, mass, mass effect, or midline shift. No hydrocephalus or extra-axial fluid collection. Periventricular white matter changes, likely the sequela of chronic small vessel ischemic disease. Age related cerebral atrophy. Chronic lacunar infarcts in the left thalamus, left basal ganglia, and right cerebellum. Vascular: No hyperdense vessel. Atherosclerotic calcifications in the intracranial carotid and vertebral arteries. Skull: Negative for fracture or focal lesion. Sinuses/Orbits: No acute finding. Status post bilateral lens replacements. Other: Trace fluid in the right mastoid air cells. IMPRESSION: No acute intracranial process. Electronically Signed   By: Donald Campion M.D.   On: 03/08/2023 19:09   DG Chest 1 View Result Date: 03/08/2023 CLINICAL DATA:  Sepsis EXAM: CHEST  1 VIEW COMPARISON:  01/16/2023 FINDINGS: Left-sided implanted cardiac device remains in place. Stable cardiomegaly status post sternotomy and CABG. Aortic atherosclerosis. Increased bibasilar interstitial markings without lobar consolidation. No visible pleural effusion. No pneumothorax. IMPRESSION: Increased bibasilar interstitial markings. Findings may reflect atelectasis versus atypical/viral infection. Electronically Signed   By: Mabel Converse D.O.   On: 03/08/2023 11:36   CT Renal Stone Study Result Date: 03/08/2023 CLINICAL DATA:  Abdominal flank pain, stone suspected EXAM: CT ABDOMEN AND PELVIS WITHOUT CONTRAST TECHNIQUE: Multidetector CT imaging of the abdomen and pelvis was performed following the standard protocol without IV contrast. RADIATION DOSE REDUCTION: This exam was performed according to the departmental dose-optimization program which includes automated exposure control, adjustment of the mA and/or kV according to patient size and/or use of iterative reconstruction technique. COMPARISON:  None Available. FINDINGS: Lower chest: Mild LEFT basilar atelectasis. Hepatobiliary: No focal hepatic lesion.  Postcholecystectomy. No biliary dilatation. Pancreas: Pancreas is normal. No ductal dilatation. No pancreatic inflammation. Spleen: Normal spleen Adrenals/urinary tract: Adrenal glands are normal. No nephrolithiasis ureterolithiasis. No obstructive uropathy. No bladder calculi. Simple fluid attenuation cyst partially exophytic from the LEFT kidney measures 2.5 cm. Stomach/Bowel: Stomach, duodenum small-bowel normal. Appendix not identified. No secondary signs appendicitis. The colon and rectosigmoid colon are normal. Vascular/Lymphatic: Abdominal aorta is normal caliber with atherosclerotic calcification. There is no retroperitoneal or periportal lymphadenopathy. No pelvic lymphadenopathy. Reproductive: Uterus and adnexa unremarkable. Other: No free fluid. Musculoskeletal: RIGHT hip prosthetic.  No acute osseous findings. IMPRESSION: 1. No acute findings in the abdomen pelvis. 2. No nephrolithiasis ureterolithiasis or obstructive uropathy. 3. Left Bosniak I benign renal cyst measuring 2.5 cm. No follow-up imaging is recommended. JACR 2018 Feb; 264-273, Management of the Incidental Renal Mass on CT, [employer] 2021; 814-848, Bosniak Classification of Cystic Renal Masses, Version 2019. 4.  Aortic Atherosclerosis (ICD10-I70.0). Electronically Signed   By: Jackquline Boxer M.D.   On: 03/08/2023 11:33   CT L-SPINE NO CHARGE Result Date: 02/20/2023 CLINICAL DATA:  Abdominal pain EXAM: CT LUMBAR SPINE WITHOUT CONTRAST TECHNIQUE: Multidetector CT imaging of the lumbar spine was performed without intravenous contrast administration. Multiplanar CT image reconstructions were also generated. RADIATION DOSE REDUCTION:  This exam was performed according to the departmental dose-optimization program which includes automated exposure control, adjustment of the mA and/or kV according to patient size and/or use of iterative reconstruction technique. COMPARISON:  CT 08/18/2022 FINDINGS: Segmentation: 5 lumbar type vertebrae.  Rudimentary ribs at the T12 segment. Alignment: Grade 1 anterolisthesis at L4-5.  No traumatic listhesis. Vertebrae: No acute fracture or focal pathologic process. Paraspinal and other soft tissues: See dedicated CT abdomen-pelvis report for full detail. Disc levels: Facet predominant degenerative spondylosis, not appreciably changed compared to the recent prior. IMPRESSION: No acute fracture or traumatic listhesis of the lumbar spine. Electronically Signed   By: Mabel Converse D.O.   On: 02/20/2023 12:53   CT ABDOMEN PELVIS W CONTRAST Result Date: 02/20/2023 CLINICAL DATA:  Left lower quadrant abdominal pain EXAM: CT ABDOMEN AND PELVIS WITH CONTRAST TECHNIQUE: Multidetector CT imaging of the abdomen and pelvis was performed using the standard protocol following bolus administration of intravenous contrast. RADIATION DOSE REDUCTION: This exam was performed according to the departmental dose-optimization program which includes automated exposure control, adjustment of the mA and/or kV according to patient size and/or use of iterative reconstruction technique. CONTRAST:  59mL OMNIPAQUE  IOHEXOL  350 MG/ML SOLN COMPARISON:  CT abdomen and pelvis dated 03/12/2022 FINDINGS: Lower chest: Partially imaged pacemaker lead terminates in the right ventricle. No focal consolidation or pulmonary nodule in the lung bases. No pleural effusion or pneumothorax demonstrated. Multichamber cardiomegaly. Hepatobiliary: No focal hepatic lesions. Mild intrahepatic bile duct dilation, likely secondary to cholecystectomy. Pancreas: Coarse calcifications in the pancreatic head, likely sequela of prior pancreatitis. No focal lesions or main ductal dilation. Mild heterogeneity of the pancreatic tail is likely secondary to beam hardening artifact from the patient's overlying hand and tubing material. Spleen: Normal in size without focal abnormality. Adrenals/Urinary Tract: No adrenal nodules. No suspicious renal mass, calculi or  hydronephrosis. Suboptimal evaluation of the bladder due to beam hardening artifact from hip arthroplasty. Stomach/Bowel: Normal appearance of the stomach. No evidence of bowel wall thickening, distention, or inflammatory changes. Colonic diverticulosis without acute diverticulitis. Appendix is not discretely seen. Vascular/Lymphatic: Aortic atherosclerosis. No enlarged abdominal or pelvic lymph nodes. Reproductive: No adnexal masses. Other: Small volume free fluid.  No free air or fluid collection. Musculoskeletal: No acute or abnormal lytic or blastic osseous lesions. Multilevel degenerative changes of the partially imaged thoracic and lumbar spine. Postsurgical changes of right hip arthroplasty. IMPRESSION: 1. No acute abdominopelvic findings. 2. Colonic diverticulosis without acute diverticulitis. 3. Small volume free fluid, indeterminate. 4. Multichamber cardiomegaly. 5.  Aortic Atherosclerosis (ICD10-I70.0). Electronically Signed   By: Limin  Xu M.D.   On: 02/20/2023 12:51    Microbiology: Results for orders placed or performed during the hospital encounter of 03/08/23  Resp panel by RT-PCR (RSV, Flu A&B, Covid) Anterior Nasal Swab     Status: None   Collection Time: 03/08/23 10:46 AM   Specimen: Anterior Nasal Swab  Result Value Ref Range Status   SARS Coronavirus 2 by RT PCR NEGATIVE NEGATIVE Final   Influenza A by PCR NEGATIVE NEGATIVE Final   Influenza B by PCR NEGATIVE NEGATIVE Final    Comment: (NOTE) The Xpert Xpress SARS-CoV-2/FLU/RSV plus assay is intended as an aid in the diagnosis of influenza from Nasopharyngeal swab specimens and should not be used as a sole basis for treatment. Nasal washings and aspirates are unacceptable for Xpert Xpress SARS-CoV-2/FLU/RSV testing.  Fact Sheet for Patients: bloggercourse.com  Fact Sheet for Healthcare Providers: seriousbroker.it  This test is not yet approved or  cleared by the [place]  [place] FDA and has been authorized for detection and/or diagnosis of SARS-CoV-2 by FDA under an Emergency Use Authorization (EUA). This EUA will remain in effect (meaning this test can be used) for the duration of the COVID-19 declaration under Section 564(b)(1) of the Act, 21 U.S.C. section 360bbb-3(b)(1), unless the authorization is terminated or revoked.     Resp Syncytial Virus by PCR NEGATIVE NEGATIVE Final    Comment: (NOTE) Fact Sheet for Patients: bloggercourse.com  Fact Sheet for Healthcare Providers: seriousbroker.it  This test is not yet approved or cleared by the [place]  FDA and has been authorized for detection and/or diagnosis of SARS-CoV-2 by FDA under an Emergency Use Authorization (EUA). This EUA will remain in effect (meaning this test can be used) for the duration of the COVID-19 declaration under Section 564(b)(1) of the Act, 21 U.S.C. section 360bbb-3(b)(1), unless the authorization is terminated or revoked.  Performed at [facility], [address] [place], [place] [address]   Blood Culture (routine x 2)     Status: None (Preliminary result)   Collection Time: 03/08/23 10:55 AM   Specimen: BLOOD  Result Value Ref Range Status   Specimen Description BLOOD RIGHT ANTECUBITAL  Final   Special Requests   Final    BOTTLES DRAWN AEROBIC AND ANAEROBIC Blood Culture adequate volume   Culture   Final    NO GROWTH 3 DAYS Performed at [facility], [address] [place], [place] [address]    Report Status PENDING  Incomplete  Blood Culture (routine x 2)     Status: None (Preliminary result)   Collection Time: 03/08/23 11:00 AM   Specimen: BLOOD  Result Value Ref Range Status   Specimen Description BLOOD BLOOD RIGHT WRIST  Final   Special Requests   Final    BOTTLES DRAWN AEROBIC AND ANAEROBIC Blood Culture adequate volume   Culture   Final    NO GROWTH 3 DAYS Performed at [facility], [address], [place], [place] [address]    Report Status PENDING  Incomplete  Urine Culture (for pregnant, neutropenic or urologic patients or patients with an indwelling urinary catheter)     Status: None   Collection Time: 03/09/23  5:46 PM   Specimen: Urine, Clean Catch  Result Value Ref Range Status   Specimen Description URINE, CLEAN CATCH  Final   Special Requests NONE  Final   Culture   Final    NO GROWTH Performed at [facility], [address], [place], [place] [address]    Report Status 03/11/2023 FINAL  Final    Labs: CBC: Recent Labs  Lab 03/08/23 1100 03/08/23 1925 03/09/23 0444 03/10/23 0540  WBC 20.3*  --  10.7* 7.1  NEUTROABS 18.8*  --   --   --   HGB 12.2 10.9* 11.2* 11.0*  HCT 38.2 34.6* 35.4* 34.1*  MCV 92.3  --  92.9 91.7  PLT 172  --  154 178   Basic Metabolic Panel: Recent Labs  Lab 03/08/23 1100 03/09/23 0444 03/10/23 0540 03/11/23 0909  NA 136 139 140 139  K 4.5 4.0 4.0 4.1  CL 108 115* 115* 112*  CO2 21* 19* 20* 20*  GLUCOSE 112* 87 85 99  BUN 26* 23 18 12   CREATININE 1.63* 1.22* 1.28* 1.08*  CALCIUM  10.5* 9.9 10.0 10.2   Liver Function Tests: Recent Labs  Lab 03/08/23 1100 03/09/23 0444 03/10/23 0540  AST 26  22 35  ALT 27 22 30   ALKPHOS 53 45 49  BILITOT 1.9* 1.1 0.8  PROT 5.8* 5.2* 5.0*  ALBUMIN 3.2* 2.7* 2.6*   CBG: No results for input(s): GLUCAP in the last 168 hours.  Discharge time spent: less than 30 minutes.  Signed: Garnette Pelt, MD [facility] 03/11/2023

## 2023-03-12 ENCOUNTER — Telehealth: Payer: Self-pay | Admitting: *Deleted

## 2023-03-12 ENCOUNTER — Other Ambulatory Visit: Payer: Self-pay | Admitting: Cardiovascular Disease

## 2023-03-12 NOTE — Transitions of Care (Post Inpatient/ED Visit) (Signed)
   03/12/2023  Name: Stephanie Atkinson MRN: [id number] DOB: 1924/04/04  Today's TOC FU Call Status: Today's TOC FU Call Status:: Successful TOC FU Call Completed TOC FU Call Complete Date: 03/12/23 Patient's Name and Date of Birth confirmed.  Transition Care Management Follow-up Telephone Call Date of Discharge: 03/11/23 Discharge Facility: [facility]) Type of Discharge: Inpatient Admission Primary Inpatient Discharge Diagnosis:: altered mental status How have you been since you were released from the hospital?: Same Any questions or concerns?: No  Items Reviewed: Did you receive and understand the discharge instructions provided?: Yes Medications obtained,verified, and reconciled?: No Medications Not Reviewed Reasons:: Other: (Daughter stated she has recvd everything and thank you for calling and  hung up) Any new allergies since your discharge?: No Dietary orders reviewed?: No Do you have support at home?: Yes People in Home: child(ren), dependent Name of Support/Comfort Primary Source: Marily Shows  Medications Reviewed Today: Medications Reviewed Today   Medications were not reviewed in this encounter     Home Care and Equipment/Supplies: Were Home Health Services Ordered?: Yes Name of Home Health Agency:: [facility] Has Agency set up a time to come to your home?: Yes First Home Health Visit Date: 03/12/23 Any new equipment or medical supplies ordered?: NA  Functional Questionnaire: Do you need assistance with bathing/showering or dressing?: Yes Do you need assistance with meal preparation?: Yes Do you need assistance with eating?: Yes Do you have difficulty maintaining continence: No Do you need assistance with getting out of bed/getting out of a chair/moving?: Yes Do you have difficulty managing or taking your medications?: Yes  Follow up appointments reviewed: PCP Follow-up appointment confirmed?: Yes Date of PCP follow-up appointment?: 03/11/23 Follow-up Provider:  Darral Ellis Specialist [facility] Follow-up appointment confirmed?: Yes Date of Specialist follow-up appointment?: 04/11/23 Follow-Up Specialty Provider:: Dr Ervin Heath Do you need transportation to your follow-up appointment?: No Do you understand care options if your condition(s) worsen?: Yes-patient verbalized understanding  Patient daughter stated she had heard from [facility] and thank you for calling and hung up.   Una Ganser BSN RN Population Health- Transition of Care Team.  [facility] [phone]

## 2023-03-13 LAB — CULTURE, BLOOD (ROUTINE X 2)
Culture: NO GROWTH
Culture: NO GROWTH
Special Requests: ADEQUATE
Special Requests: ADEQUATE

## 2023-03-13 NOTE — Telephone Encounter (Signed)
Prescription refill request for Eliquis received. Indication: AF Last office visit: 02/06/23  Judie Petit Croitoru MD Scr: 1.08 on 03/11/23  Epic Age: 89 Weight: 59.9kg  Based on above findings Eliquis 2.5mg  twice daily is the appropriate dose.  Refill approved.

## 2023-03-19 DIAGNOSIS — I10 Essential (primary) hypertension: Secondary | ICD-10-CM | POA: Diagnosis not present

## 2023-03-19 DIAGNOSIS — Z7901 Long term (current) use of anticoagulants: Secondary | ICD-10-CM | POA: Diagnosis not present

## 2023-03-19 DIAGNOSIS — Z950 Presence of cardiac pacemaker: Secondary | ICD-10-CM | POA: Diagnosis not present

## 2023-03-19 DIAGNOSIS — I4821 Permanent atrial fibrillation: Secondary | ICD-10-CM | POA: Diagnosis not present

## 2023-03-19 DIAGNOSIS — I5022 Chronic systolic (congestive) heart failure: Secondary | ICD-10-CM | POA: Diagnosis not present

## 2023-03-19 DIAGNOSIS — F321 Major depressive disorder, single episode, moderate: Secondary | ICD-10-CM | POA: Diagnosis not present

## 2023-03-19 DIAGNOSIS — E785 Hyperlipidemia, unspecified: Secondary | ICD-10-CM | POA: Diagnosis not present

## 2023-03-19 DIAGNOSIS — N1832 Chronic kidney disease, stage 3b: Secondary | ICD-10-CM | POA: Diagnosis not present

## 2023-03-19 DIAGNOSIS — M5432 Sciatica, left side: Secondary | ICD-10-CM | POA: Diagnosis not present

## 2023-04-06 ENCOUNTER — Other Ambulatory Visit: Payer: Self-pay

## 2023-04-06 ENCOUNTER — Inpatient Hospital Stay (HOSPITAL_COMMUNITY)
Admission: EM | Admit: 2023-04-06 | Discharge: 2023-04-08 | DRG: 291 | Disposition: A | Discharge status: Home Health | Payer: Medicare PPO | Attending: Internal Medicine | Admitting: Internal Medicine

## 2023-04-06 ENCOUNTER — Emergency Department (HOSPITAL_COMMUNITY): Payer: Medicare PPO

## 2023-04-06 ENCOUNTER — Encounter (HOSPITAL_COMMUNITY): Payer: Self-pay

## 2023-04-06 DIAGNOSIS — E871 Hypo-osmolality and hyponatremia: Secondary | ICD-10-CM | POA: Diagnosis present

## 2023-04-06 DIAGNOSIS — Z882 Allergy status to sulfonamides status: Secondary | ICD-10-CM

## 2023-04-06 DIAGNOSIS — Z955 Presence of coronary angioplasty implant and graft: Secondary | ICD-10-CM

## 2023-04-06 DIAGNOSIS — Z96641 Presence of right artificial hip joint: Secondary | ICD-10-CM | POA: Diagnosis present

## 2023-04-06 DIAGNOSIS — Z9049 Acquired absence of other specified parts of digestive tract: Secondary | ICD-10-CM

## 2023-04-06 DIAGNOSIS — R55 Syncope and collapse: Secondary | ICD-10-CM | POA: Diagnosis not present

## 2023-04-06 DIAGNOSIS — Z7901 Long term (current) use of anticoagulants: Secondary | ICD-10-CM | POA: Diagnosis not present

## 2023-04-06 DIAGNOSIS — E43 Unspecified severe protein-calorie malnutrition: Secondary | ICD-10-CM | POA: Diagnosis not present

## 2023-04-06 DIAGNOSIS — Z604 Social exclusion and rejection: Secondary | ICD-10-CM | POA: Diagnosis present

## 2023-04-06 DIAGNOSIS — E782 Mixed hyperlipidemia: Secondary | ICD-10-CM | POA: Diagnosis not present

## 2023-04-06 DIAGNOSIS — I447 Left bundle-branch block, unspecified: Secondary | ICD-10-CM | POA: Diagnosis present

## 2023-04-06 DIAGNOSIS — Z79890 Hormone replacement therapy (postmenopausal): Secondary | ICD-10-CM | POA: Diagnosis not present

## 2023-04-06 DIAGNOSIS — Z8249 Family history of ischemic heart disease and other diseases of the circulatory system: Secondary | ICD-10-CM

## 2023-04-06 DIAGNOSIS — Z95820 Peripheral vascular angioplasty status with implants and grafts: Secondary | ICD-10-CM | POA: Diagnosis not present

## 2023-04-06 DIAGNOSIS — N281 Cyst of kidney, acquired: Secondary | ICD-10-CM | POA: Diagnosis not present

## 2023-04-06 DIAGNOSIS — E890 Postprocedural hypothyroidism: Secondary | ICD-10-CM | POA: Diagnosis present

## 2023-04-06 DIAGNOSIS — Z880 Allergy status to penicillin: Secondary | ICD-10-CM

## 2023-04-06 DIAGNOSIS — Z951 Presence of aortocoronary bypass graft: Secondary | ICD-10-CM | POA: Diagnosis not present

## 2023-04-06 DIAGNOSIS — R519 Headache, unspecified: Secondary | ICD-10-CM | POA: Diagnosis not present

## 2023-04-06 DIAGNOSIS — Z888 Allergy status to other drugs, medicaments and biological substances status: Secondary | ICD-10-CM

## 2023-04-06 DIAGNOSIS — I10 Essential (primary) hypertension: Secondary | ICD-10-CM | POA: Diagnosis not present

## 2023-04-06 DIAGNOSIS — N179 Acute kidney failure, unspecified: Secondary | ICD-10-CM | POA: Diagnosis not present

## 2023-04-06 DIAGNOSIS — Z950 Presence of cardiac pacemaker: Secondary | ICD-10-CM

## 2023-04-06 DIAGNOSIS — I252 Old myocardial infarction: Secondary | ICD-10-CM

## 2023-04-06 DIAGNOSIS — I480 Paroxysmal atrial fibrillation: Secondary | ICD-10-CM | POA: Diagnosis not present

## 2023-04-06 DIAGNOSIS — I2510 Atherosclerotic heart disease of native coronary artery without angina pectoris: Secondary | ICD-10-CM | POA: Diagnosis present

## 2023-04-06 DIAGNOSIS — I255 Ischemic cardiomyopathy: Secondary | ICD-10-CM | POA: Diagnosis present

## 2023-04-06 DIAGNOSIS — I495 Sick sinus syndrome: Secondary | ICD-10-CM | POA: Diagnosis present

## 2023-04-06 DIAGNOSIS — E034 Atrophy of thyroid (acquired): Secondary | ICD-10-CM | POA: Diagnosis not present

## 2023-04-06 DIAGNOSIS — K5730 Diverticulosis of large intestine without perforation or abscess without bleeding: Secondary | ICD-10-CM | POA: Diagnosis not present

## 2023-04-06 DIAGNOSIS — I509 Heart failure, unspecified: Principal | ICD-10-CM

## 2023-04-06 DIAGNOSIS — D631 Anemia in chronic kidney disease: Secondary | ICD-10-CM | POA: Diagnosis not present

## 2023-04-06 DIAGNOSIS — M1990 Unspecified osteoarthritis, unspecified site: Secondary | ICD-10-CM | POA: Diagnosis not present

## 2023-04-06 DIAGNOSIS — E860 Dehydration: Secondary | ICD-10-CM | POA: Diagnosis not present

## 2023-04-06 DIAGNOSIS — I5021 Acute systolic (congestive) heart failure: Secondary | ICD-10-CM | POA: Diagnosis present

## 2023-04-06 DIAGNOSIS — R2689 Other abnormalities of gait and mobility: Secondary | ICD-10-CM | POA: Diagnosis not present

## 2023-04-06 DIAGNOSIS — Z79899 Other long term (current) drug therapy: Secondary | ICD-10-CM | POA: Diagnosis not present

## 2023-04-06 DIAGNOSIS — E039 Hypothyroidism, unspecified: Secondary | ICD-10-CM | POA: Diagnosis present

## 2023-04-06 DIAGNOSIS — I5023 Acute on chronic systolic (congestive) heart failure: Principal | ICD-10-CM | POA: Diagnosis present

## 2023-04-06 DIAGNOSIS — E785 Hyperlipidemia, unspecified: Secondary | ICD-10-CM | POA: Diagnosis not present

## 2023-04-06 DIAGNOSIS — I130 Hypertensive heart and chronic kidney disease with heart failure and stage 1 through stage 4 chronic kidney disease, or unspecified chronic kidney disease: Principal | ICD-10-CM | POA: Diagnosis present

## 2023-04-06 DIAGNOSIS — I878 Other specified disorders of veins: Secondary | ICD-10-CM | POA: Diagnosis present

## 2023-04-06 DIAGNOSIS — G4489 Other headache syndrome: Secondary | ICD-10-CM | POA: Diagnosis not present

## 2023-04-06 DIAGNOSIS — R42 Dizziness and giddiness: Secondary | ICD-10-CM | POA: Diagnosis not present

## 2023-04-06 DIAGNOSIS — E8809 Other disorders of plasma-protein metabolism, not elsewhere classified: Secondary | ICD-10-CM | POA: Diagnosis present

## 2023-04-06 DIAGNOSIS — Z6822 Body mass index (BMI) 22.0-22.9, adult: Secondary | ICD-10-CM | POA: Diagnosis not present

## 2023-04-06 DIAGNOSIS — N1832 Chronic kidney disease, stage 3b: Secondary | ICD-10-CM | POA: Diagnosis present

## 2023-04-06 DIAGNOSIS — M7989 Other specified soft tissue disorders: Secondary | ICD-10-CM | POA: Diagnosis not present

## 2023-04-06 DIAGNOSIS — J810 Acute pulmonary edema: Secondary | ICD-10-CM | POA: Diagnosis not present

## 2023-04-06 DIAGNOSIS — D649 Anemia, unspecified: Secondary | ICD-10-CM | POA: Diagnosis present

## 2023-04-06 DIAGNOSIS — N390 Urinary tract infection, site not specified: Secondary | ICD-10-CM | POA: Diagnosis not present

## 2023-04-06 DIAGNOSIS — R001 Bradycardia, unspecified: Secondary | ICD-10-CM | POA: Diagnosis not present

## 2023-04-06 DIAGNOSIS — I639 Cerebral infarction, unspecified: Secondary | ICD-10-CM | POA: Diagnosis not present

## 2023-04-06 DIAGNOSIS — Z66 Do not resuscitate: Secondary | ICD-10-CM | POA: Diagnosis not present

## 2023-04-06 DIAGNOSIS — Z87440 Personal history of urinary (tract) infections: Secondary | ICD-10-CM

## 2023-04-06 DIAGNOSIS — I6782 Cerebral ischemia: Secondary | ICD-10-CM | POA: Diagnosis not present

## 2023-04-06 DIAGNOSIS — R531 Weakness: Secondary | ICD-10-CM | POA: Diagnosis not present

## 2023-04-06 DIAGNOSIS — I4821 Permanent atrial fibrillation: Secondary | ICD-10-CM | POA: Diagnosis present

## 2023-04-06 DIAGNOSIS — I110 Hypertensive heart disease with heart failure: Secondary | ICD-10-CM | POA: Diagnosis not present

## 2023-04-06 DIAGNOSIS — R609 Edema, unspecified: Secondary | ICD-10-CM | POA: Diagnosis not present

## 2023-04-06 DIAGNOSIS — Z9181 History of falling: Secondary | ICD-10-CM | POA: Diagnosis present

## 2023-04-06 LAB — COMPREHENSIVE METABOLIC PANEL
ALT: 22 U/L (ref 0–44)
AST: 26 U/L (ref 15–41)
Albumin: 3.2 g/dL — ABNORMAL LOW (ref 3.5–5.0)
Alkaline Phosphatase: 61 U/L (ref 38–126)
Anion gap: 7 (ref 5–15)
BUN: 16 mg/dL (ref 8–23)
CO2: 22 mmol/L (ref 22–32)
Calcium: 10.3 mg/dL (ref 8.9–10.3)
Chloride: 99 mmol/L (ref 98–111)
Creatinine, Ser: 1.23 mg/dL — ABNORMAL HIGH (ref 0.44–1.00)
GFR, Estimated: 40 mL/min — ABNORMAL LOW (ref >60)
Glucose, Bld: 91 mg/dL (ref 70–99)
Potassium: 4.2 mmol/L (ref 3.5–5.1)
Sodium: 128 mmol/L — ABNORMAL LOW (ref 135–145)
Total Bilirubin: 0.8 mg/dL (ref 0.0–1.2)
Total Protein: 5.6 g/dL — ABNORMAL LOW (ref 6.5–8.1)

## 2023-04-06 LAB — CBC WITH DIFFERENTIAL/PLATELET
Abs Immature Granulocytes: 0.01 10*3/uL (ref 0.00–0.07)
Basophils Absolute: 0 10*3/uL (ref 0.0–0.1)
Basophils Relative: 0 %
Eosinophils Absolute: 0 10*3/uL (ref 0.0–0.5)
Eosinophils Relative: 1 %
HCT: 34.4 % — ABNORMAL LOW (ref 36.0–46.0)
Hemoglobin: 10.9 g/dL — ABNORMAL LOW (ref 12.0–15.0)
Immature Granulocytes: 0 %
Lymphocytes Relative: 29 %
Lymphs Abs: 1.2 10*3/uL (ref 0.7–4.0)
MCH: 29 pg (ref 26.0–34.0)
MCHC: 31.7 g/dL (ref 30.0–36.0)
MCV: 91.5 fL (ref 80.0–100.0)
Monocytes Absolute: 0.5 10*3/uL (ref 0.1–1.0)
Monocytes Relative: 12 %
Neutro Abs: 2.3 10*3/uL (ref 1.7–7.7)
Neutrophils Relative %: 58 %
Platelets: 174 10*3/uL (ref 150–400)
RBC: 3.76 MIL/uL — ABNORMAL LOW (ref 3.87–5.11)
RDW: 14 % (ref 11.5–15.5)
WBC: 4.1 10*3/uL (ref 4.0–10.5)
nRBC: 0 % (ref 0.0–0.2)

## 2023-04-06 LAB — BRAIN NATRIURETIC PEPTIDE: B Natriuretic Peptide: 339.2 pg/mL — ABNORMAL HIGH (ref 0.0–100.0)

## 2023-04-06 LAB — TROPONIN I (HIGH SENSITIVITY)
Troponin I (High Sensitivity): 15 ng/L (ref <18)
Troponin I (High Sensitivity): 14 ng/L (ref <18)

## 2023-04-06 LAB — MAGNESIUM: Magnesium: 1.9 mg/dL (ref 1.7–2.4)

## 2023-04-06 MED ORDER — MELATONIN 3 MG PO TABS
3.0000 mg | ORAL_TABLET | Freq: Every evening | ORAL | Status: DC | PRN
  Administered 2023-04-07: 3 mg via ORAL
  Filled 2023-04-06: qty 1

## 2023-04-06 MED ORDER — HYDROCODONE-ACETAMINOPHEN 5-325 MG PO TABS
1.0000 | ORAL_TABLET | Freq: Once | ORAL | Status: AC
  Administered 2023-04-06: 1 via ORAL
  Filled 2023-04-06: qty 1

## 2023-04-06 MED ORDER — APIXABAN 2.5 MG PO TABS
2.5000 mg | ORAL_TABLET | Freq: Two times a day (BID) | ORAL | Status: DC
  Administered 2023-04-06 – 2023-04-08 (×4): 2.5 mg via ORAL
  Filled 2023-04-06 (×4): qty 1

## 2023-04-06 MED ORDER — FUROSEMIDE 10 MG/ML IJ SOLN
40.0000 mg | Freq: Two times a day (BID) | INTRAMUSCULAR | Status: DC
  Administered 2023-04-07 – 2023-04-08 (×3): 40 mg via INTRAVENOUS
  Filled 2023-04-06 (×3): qty 4

## 2023-04-06 MED ORDER — ONDANSETRON HCL 4 MG/2ML IJ SOLN: 4.0000 mg | Freq: Four times a day (QID) | INTRAMUSCULAR | Status: DC | PRN

## 2023-04-06 MED ORDER — ACETAMINOPHEN 325 MG PO TABS: 650.0000 mg | ORAL_TABLET | Freq: Four times a day (QID) | ORAL | Status: DC | PRN

## 2023-04-06 MED ORDER — ACETAMINOPHEN 650 MG RE SUPP: 650.0000 mg | Freq: Four times a day (QID) | RECTAL | Status: DC | PRN

## 2023-04-06 MED ORDER — FUROSEMIDE 10 MG/ML IJ SOLN
40.0000 mg | Freq: Once | INTRAMUSCULAR | Status: AC
  Administered 2023-04-06: 40 mg via INTRAVENOUS
  Filled 2023-04-06: qty 4

## 2023-04-06 NOTE — H&P (Addendum)
 History and Physical      Stephanie Atkinson FMW:[id number] DOB: Sep 01, 1924 DOA: 04/06/2023; DOS: 04/06/2023  PCP: Corlis Pagan, NP  Patient coming from: home   I have personally briefly reviewed patient's old medical records in [facility]  Chief Complaint: Swelling in the bilateral lower extremities  HPI: Stephanie Atkinson is a 89 y.o. female with medical history significant for chronic systolic heart failure, CKD 3B associated with baseline creatinine 1.1-1.3, coronary artery disease status post CABG in 2006, essential hypertension, hyperlipidemia, acquired thyroidism, paroxysmal atrial fibrillation complicated by sick sinus syndrome status post pacemaker placement in 2019, chronically anticoagulated on Eliquis , chronic hypercalcemia, who is admitted to [facility] on 04/06/2023 with suspected acute on chronic systolic heart failure after presenting from home to [facility] ED complaining of worsening of edema in the bilateral lower extremities.   The patient reports 2 weeks of progressive symmetrical edema involving the bilateral lower extremities.  Over the timeframe, she has noted slight increase in shortness of breath with exertion.  Denies shortness of breath at rest, and also denies any associated chest pain, palpitations, diaphoresis, nausea, vomiting, dizziness, presyncope, or syncope.  Not associate with any new onset calf tenderness or new lower extremity erythema.  No wheezing or hemoptysis.  She also denies any associated subjective fever, chills, rigors, or generalized myalgias.  She has a history of chronic systolic heart failure, with most recent echocardiogram in November 2024, which was notable for LVEF 35 to 40% as well as indeterminate diastolic parameters.  She reports good compliance with her outpatient diuretic regimen which consists of alternating daily doses of Lasix  20 mg p.o. daily with Lasix  40 mg p.o. daily.  She is also on spironolactone  at home.  She notes that compliance with  her additional goal-directed heart failure therapy including Entresto  as well as Coreg .  Her medical history is also notable for paroxysmal atrial fibrillation complicated by sick sinus syndrome status post pacemaker placement 2019.  She is chronically anticoagulated on Eliquis .  Medical history also notable for chronic hypercalcemia, with adjusted calcium  levels notable for the following: 11.4 on 01/16/2023, 11.2 on 03/10/2023.     ED Course:  Vital signs in the ED were notable for the following: Afebrile; rates in the 60s to 70; systolic pressures in the 130s to 140; respiratory rate 16-20, oxygen saturation 98-1 high percent on room air.  Labs were notable for the following: CMP notable for the following: Sodium 128 compared to most recent prior value of 139 on 03/11/2023, potassium 4.2, carbon 22, creatinine 1.23 compared to 1.08 on 03/11/2023, glucose 91, calcium  adjusted for hypoalbuminemia noted to be 10.9, avidin 3.2.  Otherwise, liver enzymes are within normal limits.  BNP 339 compared to 499 on 01/16/2023.  High sensitive troponin I was initially 15, with repeat value trending down to 14.  CBC notable for white cell count 4100, hemoglobin 10.9 associated Neuraceq/Norocarp properties as well as nonelevated RDW and relative demonstration prior hemoglobin value of 11.0 on 03/10/2023, platelet count 174.  Per my interpretation, EKG in ED demonstrated the following: In comparison to most recent prior EKG on 03/10/2023, presenting EKG shows sinus rhythm with prolonged PR interval at 220, with nonspecific intraventricular conduction delay, left bundle branch block, which is unchanged from most recent prior EKG, with heart rate 71, nonspecific to inversions in aVL and V6, which appear unchanged from most recent prior EKG, will demonstrate no evidence of ST changes, Cleen evidence of ST elevation.  Imaging in the ED,  per corresponding formal radiology read, was notable for the following: 1 view chest  x-ray shows no evidence of acute cardiopulmonary process, Cleen evidence of infiltrate, edema, effusion, or pneumothorax.  While in the ED, the following were administered: Lasix  40 mg IV x 1 dose, Norco 5/325 mg p.o. x 1 dose.  Subsequently, the patient was admitted for further evaluation and management of suspected acute on chronic systolic heart failure, with presenting labs also notable for acute hyponatremia as well as suggestive of subacute anemia.     Review of Systems: As per HPI otherwise 10 point review of systems negative.   Past Medical History:  Diagnosis Date   Chronic kidney disease, stage 3 (HCC)    Coronary artery disease    History of UTI    Hx of CABG 2006   LIMA-LAD, free RIMA-PDA, Radial-OM1-OM2   Hyperlipidemia    Hypertension    Hypothyroid    MI, old        PAF (paroxysmal atrial fibrillation) (HCC)    was on amio, d/c'd due to prolonged PR interval, rate control   Presence of permanent cardiac pacemaker 07/16/2017   Presence of stent in right coronary artery 08/2010   RIMA-PDA occluded, DES stent placed   Syncope 08/2010   bradycardic, beta blocker decreased, also felt to be dehydrated    Past Surgical History:  Procedure Laterality Date   CARDIAC CATHETERIZATION  12/10/2010   dr. alm harding, revealing a atretic bypass to her right with patent distal RCA stent   CAROTID STENT  2012   CHOLECYSTECTOMY     CORONARY ARTERY BYPASS GRAFT  2006   LIMA-LAD, free RIMA-PDA, Radial-OM1-OM2   DOPPLER ECHOCARDIOGRAPHY  09/27/2010   mild asymmetric left ventricular hypertrophy, left ventricular systolic function is low normal, ejection fraction = 50-55%, the LA is moderate dilated, the RA is mildly dilated, no significant valvular disease   INSERT / REPLACE / REMOVE PACEMAKER  07/16/2017   LEFT HEART CATHETERIZATION WITH CORONARY/GRAFT ANGIOGRAM N/A 10/12/2012   Procedure: LEFT HEART CATHETERIZATION WITH EL BILE;  Surgeon: Alm LELON Clay,  MD;  Location: [facility] CATH LAB;  Service: Cardiovascular;  Laterality: N/A;   LOOP RECORDER IMPLANT N/A 10/15/2012   Procedure: LOOP RECORDER IMPLANT;  Surgeon: Jerel Balding, MD;  Location: [facility] CATH LAB;  Service: Cardiovascular;  Laterality: N/A;   LOOP RECORDER REMOVAL  07/16/2017   LOOP RECORDER REMOVAL N/A 07/16/2017   Procedure: LOOP RECORDER REMOVAL;  Surgeon: Balding Jerel, MD;  Location: [facility] INVASIVE CV LAB;  Service: Cardiovascular;  Laterality: N/A;   NM MYOVIEW  LTD  11/29/2008   post stress left ventricle is normal in size, post stress ejection fraction is 67% global left ventricular systolic function is normal, normal myocardial perfusion study, abnormal myocardial perfusion study, low risk scan   PACEMAKER IMPLANT N/A 07/16/2017   Procedure: PACEMAKER IMPLANT;  Surgeon: Balding Jerel, MD;  Location: [facility] INVASIVE CV LAB;  Service: Cardiovascular;  Laterality: N/A;   THYROIDECTOMY     TOTAL HIP ARTHROPLASTY Right     Social History:  reports that she has never smoked. Her smokeless tobacco use includes snuff. She reports that she does not drink alcohol  and does not use drugs.   Allergies  Allergen Reactions   Penicillins Hives   Sulfa Antibiotics Hives and Swelling   Gabapentin  Other (See Comments)    Confusion   Lyrica [Pregabalin] Other (See Comments)    Confusion    Family History  Problem Relation Age of Onset   CAD  Mother    CAD Maternal Grandmother     Family history reviewed and not pertinent    Prior to Admission medications   Medication Sig Start Date End Date Taking? Authorizing Provider  acetaminophen  (TYLENOL ) 500 MG tablet Take 1 tablet (500 mg total) by mouth every 6 (six) hours as needed for moderate pain. Patient taking differently: Take 1,000 mg by mouth 3 (three) times daily. 06/27/19   Ezenduka, Nkeiruka J, MD  atorvastatin  (LIPITOR ) 80 MG tablet Take 80 mg by mouth at bedtime.     [provider]  carvedilol  (COREG ) 3.125 MG tablet TAKE 1  TABLET BY MOUTH 2 TIMES DAILY. 03/13/23   Croitoru, Mihai, MD  ELIQUIS  2.5 MG TABS tablet TAKE 1 TABLET BY MOUTH TWICE A DAY 03/13/23   Croitoru, Mihai, MD  furosemide  (LASIX ) 20 MG tablet Take 1 tablet (20 mg total) by mouth daily. Take two tablets in case of weight gain 2 to 3 lbs in 24 hrs or 5 lbs in 7 days, until weight back to baseline. Patient taking differently: Take 20 mg by mouth See admin instructions. Take 20mg  by mouth on Tuesday, Thursday, Saturday, Sunday. 02/10/23   Croitoru, Mihai, MD  furosemide  (LASIX ) 40 MG tablet Take 40 mg by mouth See admin instructions. Take 40mg  by mouth every Monday, Wednesday, and Friday. 01/23/23   [provider]  HYDROcodone -acetaminophen  (NORCO/VICODIN) 5-325 MG tablet Take 1 tablet by mouth See admin instructions. Take 1 tablet by mouth twice a day. May take a third dose if needed for breakthrough pain. 03/03/23   [provider]  levothyroxine  (SYNTHROID , LEVOTHROID) 50 MCG tablet Take 50 mcg by mouth daily before breakfast.     [provider]  lidocaine  (LIDODERM ) 5 % Place 1 patch onto the skin daily. 02/25/23   [provider]  Polyethyl Glycol-Propyl Glycol (SYSTANE OP) Place 1 drop into both eyes daily as needed (dry eye).    [provider]  sacubitril -valsartan  (ENTRESTO ) 24-26 MG Take 1 tablet by mouth 2 (two) times daily. 02/06/23   Croitoru, Mihai, MD  spironolactone  (ALDACTONE ) 25 MG tablet Take 1 tablet (25 mg total) by mouth daily. 01/22/23   Court Dorn PARAS, MD     Objective    Physical Exam: Vitals:   04/06/23 1653 04/06/23 1655 04/06/23 1657 04/06/23 1745  BP:   133/60 (!) 142/61  Pulse:   72 (!) 56  Resp:   20 16  Temp:   97.6 F (36.4 C)   TempSrc:   Oral   SpO2: 98%  100% 100%  Weight:  59 kg    Height:  5' 2 (1.575 m)      General: appears to be stated age; alert, oriented Skin: warm, dry, no rash Head:  AT/[place] Mouth:  Oral mucosa membranes appear moist, normal  dentition Neck: supple; trachea midline Heart: Pacemaker noted ; RRR; did not appreciate any M/R/G Lungs: CTAB, did not appreciate any wheezes, rales, or rhonchi Abdomen: + BS; soft, ND, NT Vascular: 2+ pedal pulses b/l; 2+ radial pulses b/l Extremities: 2+ edema in the bilateral lower extremities, no muscle wasting Neuro: strength and sensation intact in upper and lower extremities b/l    Labs on Admission: I have personally reviewed following labs and imaging studies  CBC: Recent Labs  Lab 04/06/23 1743  WBC 4.1  NEUTROABS 2.3  HGB 10.9*  HCT 34.4*  MCV 91.5  PLT 174   Basic Metabolic Panel: Recent Labs  Lab 04/06/23 1743  NA 128*  K 4.2  CL 99  CO2 22  GLUCOSE 91  BUN 16  CREATININE 1.23*  CALCIUM  10.3   GFR: Estimated Creatinine Clearance: 21.2 mL/min (A) (by C-G formula based on SCr of 1.23 mg/dL (H)). Liver Function Tests: Recent Labs  Lab 04/06/23 1743  AST 26  ALT 22  ALKPHOS 61  BILITOT 0.8  PROT 5.6*  ALBUMIN 3.2*   No results for input(s): LIPASE, AMYLASE in the last 168 hours. No results for input(s): AMMONIA in the last 168 hours. Coagulation Profile: No results for input(s): INR, PROTIME in the last 168 hours. Cardiac Enzymes: No results for input(s): CKTOTAL, CKMB, CKMBINDEX, TROPONINI in the last 168 hours. BNP (last 3 results) No results for input(s): PROBNP in the last 8760 hours. HbA1C: No results for input(s): HGBA1C in the last 72 hours. CBG: No results for input(s): GLUCAP in the last 168 hours. Lipid Profile: No results for input(s): CHOL, HDL, LDLCALC, TRIG, CHOLHDL, LDLDIRECT in the last 72 hours. Thyroid  Function Tests: No results for input(s): TSH, T4TOTAL, FREET4, T3FREE, THYROIDAB in the last 72 hours. Anemia Panel: No results for input(s): VITAMINB12, FOLATE, FERRITIN, TIBC, IRON, RETICCTPCT in the last 72 hours. Urine analysis:    Component Value Date/Time    COLORURINE YELLOW 03/08/2023 1236   APPEARANCEUR CLEAR 03/08/2023 1236   LABSPEC 1.009 03/08/2023 1236   PHURINE 5.0 03/08/2023 1236   GLUCOSEU NEGATIVE 03/08/2023 1236   HGBUR NEGATIVE 03/08/2023 1236   BILIRUBINUR NEGATIVE 03/08/2023 1236   KETONESUR NEGATIVE 03/08/2023 1236   PROTEINUR NEGATIVE 03/08/2023 1236   UROBILINOGEN 1.0 10/14/2012 0922   NITRITE NEGATIVE 03/08/2023 1236   LEUKOCYTESUR NEGATIVE 03/08/2023 1236    Radiological Exams on Admission: DG Chest Portable 1 View Result Date: 04/06/2023 CLINICAL DATA:  Dyspnea, bilateral lower extremity swelling EXAM: PORTABLE CHEST 1 VIEW COMPARISON:  03/08/2023 FINDINGS: Single frontal view of the chest demonstrates postsurgical changes from CABG. Single lead pacer unchanged. Cardiac silhouette remains enlarged. No airspace disease, effusion, or pneumothorax. No acute bony abnormalities. IMPRESSION: 1. Stable enlarged cardiac silhouette. 2. No acute intrathoracic process. Electronically Signed   By: Ozell Daring M.D.   On: 04/06/2023 18:04      Assessment/Plan   Principal Problem:   Acute on chronic systolic (congestive) heart failure (HCC) Active Problems:   CKD stage 3b, GFR 30-44 ml/min (HCC)   Hypercalcemia   Acquired hypothyroidism   Essential hypertension   Paroxysmal atrial fibrillation (HCC)   HLD (hyperlipidemia)   Acute hyponatremia   Acute anemia    #) Acute on chronic systolic heart failure: dx of acute decompensation on the basis of presenting 2 weeks of progressive edema in the bilateral lower extremities will importance of dyspnea on exertion over that timeframe as well as finding of acute hypoosmolar hypervolemic hyponatremia, which is new relative to labs performed on 03/11/2023. This is in the context of a known history of chronic systolic heart failure, with most recent echocardiogram performed in November 2024, which was notable for LVEF 35 to 40%, indeterminate diastolic parameters and normal right  ventricular systolic function.   Etiology leading to presenting acutely decompensated heart failure is not entirely clear at this time. Patient conveys good compliance with home diuretic therapy, which consists of Lasix  20 mg p.o. every 48 hours, Lasix  40 mg p.o. every 48 hours, and spironolactone , as well as good compliance with their additional home goal-directed cardiac medications, which include Entresto , Coreg . Overall, ACS leading to presenting acutely decompensated heart failure appears less likely at  this time in the absence of any recent CP, presenting EKG showing no evidence of acute ischemic changes, as well as non-elevated troponin.   Of note, patient received Lasix  40 mg IV x 1 dose while in the ED today. Presentation warrants additional IV diuresis, as further detailed below, with close monitoring of ensuing renal function, electrolytes, and volume status, as further noted below.   As patient is already on a BB at home, will plan to continue this.   Presenting labs are also notable for subacute anemia, which I suspect is hemodilutional in the context of her presenting acute volume overload.  However, will pursue further evaluation of such, as relative anemia could predispose her to ensuing development of acutely decompensated heart failure.   Plan: monitor strict I's & O's and daily weights. Monitor on telemetry. Repeat CMP in the morning, including for monitoring trend of potassium, bicarbonate, and renal function in response to interval diuresis efforts.  Repeat serum magnesium  level in the morning.  Close monitoring of ensuing blood pressure response to diuresis efforts, including to help guide need for improvement in afterload reduction in order to optimize cardiac output. Lasix  40 mg IV twice daily.  Hold next dose of spironolactone  for now.  Continue outpatient BB, and Entresto .  Further evaluation management of subacute anemia, as below.  Also placed the nurse communication order  requesting a bilateral lower extremities to be elevated. With the discomfort a/w the swelling in her b/l LE's, I'll also resume her outpatient prn Norco.                   #) Acute hypo-osmolar hypervolemic hyponatremia: Presenting sodium level 128 compared to most recent prior value of 139 on 03/11/2023.  Suspect hypervolemic contribution in the context of her presenting acute on chronic systolic heart failure, as above.  Will pursue additional IV diuresis efforts and closely monitoring ensuing serum sodium trend in response to this.  Will also expand diagnostic evaluation, as further detailed below.  Of note, presenting chest x-ray shows no evidence of acute cardiopulmonary process.  Presentation is not associate with any acute focal neurologic deficits.  Additionally, in the context of a documented history of acquired hypothyroidism, will also check TSH level.  Plan: monitor strict I's and O's and daily weights.  check random urine sodium, urine osmolality.  Check serum osmolality to confirm suspected hypoosmolar etiology.  Repeat CMP in the morning. Check TSH.  Further evaluation management of presenting acute on chronic systolic heart failure, including additional IV Lasix , as further detailed above.                  #) Subacute normocytic anemia: Relative to the patient's baseline hemoglobin level of 12-14, appears that her hemoglobin has been running slightly lower since mid January 2025, with hemoglobin values between 10 and 11 since that time.  There is suspected to be some contribution towards this from hemodilution in the context of presenting acute on chronic systolic heart failure.  However, we will expand diagnostic evaluation given increased risk for further exacerbation in her heart failure as a consequence of worsening edema.  While she is chronically anticoagulated on Eliquis  in the setting of paroxysmal atrial fibrillation, there is no overt evidence of active  bleed, and she appears hemodynamically stable at this time.  Plan: Add on iron studies, B12, folic acid level.  With the caveat that she is on Eliquis , will also check PTT and INR.  Repeat CBC in the morning.  Monitor strict I's and O's and daily weights.  Further evaluation management of presenting acute on chronic systolic heart failure, as above.                    #) CKD Stage 3B: Documented history of such, with baseline creatinine 1.1-1.3, with presenting creatinine consistent with this baseline.    Plan: Monitor strict I's and O's and daily weights.  Attempt to avoid nephrotoxic agents.  CMP/magnesium  level in the AM.                   #) Essential Hypertension: documented h/o such, with outpatient antihypertensive regimen including Coreg , Entresto , Lasix , spironolactone .  SBP's in the ED today: 130s to 140s mmHg.   Plan: Close monitoring of subsequent BP via routine VS. continue outpatient Coreg  and Entresto .  Will hold home oral Lasix  and spironolactone  for now and lieu of plan for IV diuresis with Lasix  in the setting of her presenting acute on chronic systolic heart failure, as above.  Monitor strict I's and O's and daily weights.  Monitor on telemetry.  CMP in the morning.                   #) Hyperlipidemia: documented h/o such. On high intensity atorvastatin  as outpatient.   Plan: continue home statin.                    #) acquired hypothyroidism: documented h/o such, on Synthroid  as outpatient.  In the setting of her presenting acute hyponatremia, will also check TSH level.  Plan: cont home Synthroid .  Check TSH level.                    #) Paroxysmal atrial fibrillation: Documented history of such. In setting of CHA2DS2-VASc score of  6, there is an indication for chronic anticoagulation for thromboembolic prophylaxis. Consistent with this, patient is chronically anticoagulated on Eliquis . Home  AV nodal blocking regimen: Coreg .  Most recent echocardiogram was performed in November 2024 and notable for severely dilated bilateral atria, predisposing her to increased frequency of atrial fibrillation, also demonstrating trivial mitral regurgitation. Presenting EKG sinus rhythm.  Her history of paroxysmal atrial fibrillation is complicated by sick sinus syndrome status post pacemaker placement in 2019.  Plan: monitor strict I's & O's and daily weights. CMP/CBC in AM. Check serum mag level. Continue home AV nodal blocking regimen.  Continue outpatient Eliquis .  Monitor on telemetry.                     #) Chronic hypercalcemia: As quantified above, with presenting calcium  level of 10.9, slightly lower than most recent prior value of 11.2 on 03/10/2023.  Per my initial chart review I have not seen documentation of underlying etiology, although given the chronicity of this, the differential would favor primary hyperparathyroidism.  Does not appear to be on outpatient medications that would classically increase serum calcium  level.  Will closely monitor ensuing calcium  level and response to plan for additional IV Lasix  in the setting of her acute on chronic systolic heart failure.  Overall, appears asymptomatic as relates to her chronic hypercalcemia.  Plan: Repeat CMP in the morning.  Check serum magnesium  and phosphorus levels.  Monitor strict I's and O's and daily weights.       DVT prophylaxis: Continue outpatient Eliquis  Code Status: DNR/DNI consistent with active DNR form and consistent with code status documentation from most recent prior hospitalization. Family  Communication: none Disposition Plan: Per Rounding Team Consults called: none;  Admission status: Inpatient     I SPENT GREATER THAN 75  MINUTES IN CLINICAL CARE TIME/MEDICAL DECISION-MAKING IN COMPLETING THIS ADMISSION.      Eva NOVAK Javaris Wigington DO [facility]  From 7PM - 7AM   04/06/2023, 8:42 PM

## 2023-04-06 NOTE — ED Provider Notes (Signed)
 [place] EMERGENCY DEPARTMENT AT [facility] Provider Note  MDM   HPI/ROS:  Stephanie Atkinson is a 89 y.o. female with pertinent past medical history of coronary artery disease status post CABG, hypertension, hyperlipidemia, hypothyroidism, atrial fibrillation on Eliquis , CKD stage III, symptomatic bradycardia and syncope, status post PPM ([employer]tr[employer] Azure XT SR implanted May 2019), HFrEF (EF 35-50% 12/2022) who presents for evaluation of leg swelling and dyspnea.  Patient reports a 2-week history of progressively worsening bilateral lower extremity swelling and pain that has been worse over the past 2 days.  She additionally reports increasing fatigue and intermittent dyspnea on exertion. She has had periodic diarrhea and abdominal pain over the past couple of weeks as well. Per chart review, patient was admitted 1/11 - 03/11/2023 for generalized weakness and confusion, found to have urosepsis with septic shock.   Physical exam is notable for: - 3+ pitting edema BLE, chronic venous stasis changes and overlying TTP -- palpable dp pulses bilaterally   On my initial evaluation, patient is:  -Vital signs stable. Patient afebrile, hemodynamically stable, and non-toxic appearing. -Additional history obtained from review of prior records -EKG reviewed: Sinus rhythm rate 71, left bundle branch block present does not meet Sgarbossa's criteria, no significant changes from prior   This patient's current presentation, including their history and physical exam, is most consistent with heart failure exacerbation. Other differentials include cellulitis, chronic lymphedema, less likely PE, DVT based on history and exam as well as compliance with Eliquis .  EKG obtained on arrival as above demonstrates known left bundle branch block without significant change from prior and presentation felt unlikely due to ACS.  Chest x-ray demonstrated no focal consolidations and no significant pulmonary edema.  Labs with  mildly elevated BNP of 339, mild hyponatremia of 128 otherwise labs stable from prior with anemia of 10.9, creatinine 1.23.  Patient was given IV Lasix  for diuresis.   Patient felt to require admission for further evaluation and management of heart failure exacerbation given age and numerous comorbidities.  She will be admitted to the hospitalist service.  Disposition  I discussed the case with Dr. Marcene with the hospitalist service who graciously agreed to admit the patient to their service for continued care.   Clinical Impression:  1. Acute on chronic congestive heart failure, unspecified heart failure type (HCC)     Rx / DC Orders ED Discharge Orders     None       The plan for this patient was discussed with Dr. Dasie, who voiced agreement and who oversaw evaluation and treatment of this patient.   Clinical Complexity A medically appropriate history, review of systems, and physical exam was performed.  My independent interpretations of EKG, labs, and radiology are documented in the ED course above.   If decision rules were used in this patient's evaluation, they are listed below.    Patient's presentation is most consistent with acute presentation with potential threat to life or bodily function.  Medical Decision Making Amount and/or Complexity of Data Reviewed Labs: ordered. Radiology: ordered.  Risk Prescription drug management. Decision regarding hospitalization.    HPI/ROS      See MDM section for pertinent HPI and ROS. A complete ROS was performed with pertinent positives/negatives noted above.   Past Medical History:  Diagnosis Date   Chronic kidney disease, stage 3 (HCC)    Coronary artery disease    History of UTI    Hx of CABG 2006   LIMA-LAD, free RIMA-PDA, Radial-OM1-OM2  Hyperlipidemia    Hypertension    Hypothyroid    MI, old        PAF (paroxysmal atrial fibrillation) (HCC)    was on amio, d/c'd due to prolonged PR interval, rate  control   Presence of permanent cardiac pacemaker 07/16/2017   Presence of stent in right coronary artery 08/2010   RIMA-PDA occluded, DES stent placed   Syncope 08/2010   bradycardic, beta blocker decreased, also felt to be dehydrated    Past Surgical History:  Procedure Laterality Date   CARDIAC CATHETERIZATION  12/10/2010   dr. alm harding, revealing a atretic bypass to her right with patent distal RCA stent   CAROTID STENT  2012   CHOLECYSTECTOMY     CORONARY ARTERY BYPASS GRAFT  2006   LIMA-LAD, free RIMA-PDA, Radial-OM1-OM2   DOPPLER ECHOCARDIOGRAPHY  09/27/2010   mild asymmetric left ventricular hypertrophy, left ventricular systolic function is low normal, ejection fraction = 50-55%, the LA is moderate dilated, the RA is mildly dilated, no significant valvular disease   INSERT / REPLACE / REMOVE PACEMAKER  07/16/2017   LEFT HEART CATHETERIZATION WITH CORONARY/GRAFT ANGIOGRAM N/A 10/12/2012   Procedure: LEFT HEART CATHETERIZATION WITH EL BILE;  Surgeon: Alm LELON Clay, MD;  Location: [facility] CATH LAB;  Service: Cardiovascular;  Laterality: N/A;   LOOP RECORDER IMPLANT N/A 10/15/2012   Procedure: LOOP RECORDER IMPLANT;  Surgeon: Jerel Balding, MD;  Location: [facility] CATH LAB;  Service: Cardiovascular;  Laterality: N/A;   LOOP RECORDER REMOVAL  07/16/2017   LOOP RECORDER REMOVAL N/A 07/16/2017   Procedure: LOOP RECORDER REMOVAL;  Surgeon: Balding Jerel, MD;  Location: [facility] INVASIVE CV LAB;  Service: Cardiovascular;  Laterality: N/A;   NM MYOVIEW  LTD  11/29/2008   post stress left ventricle is normal in size, post stress ejection fraction is 67% global left ventricular systolic function is normal, normal myocardial perfusion study, abnormal myocardial perfusion study, low risk scan   PACEMAKER IMPLANT N/A 07/16/2017   Procedure: PACEMAKER IMPLANT;  Surgeon: Balding Jerel, MD;  Location: [facility] INVASIVE CV LAB;  Service: Cardiovascular;  Laterality: N/A;   THYROIDECTOMY      TOTAL HIP ARTHROPLASTY Right       Physical Exam   Vitals:   04/06/23 1653 04/06/23 1655 04/06/23 1657  BP:   133/60  Pulse:   72  Resp:   20  Temp:   97.6 F (36.4 C)  TempSrc:   Oral  SpO2: 98%  100%  Weight:  59 kg   Height:  5' 2 (1.575 m)     Physical Exam Gen: NAD. Appears comfortable HENT: Conjunctiva clear, PERRL, EOMI. MMM.  CV: RRR. No M/R/G Pulm: Lungs CTAB with no wheezing, rales, or rhonchi.  No increased work of breathing. GI: Abdomen soft, non-tender, non-distended. Normal bowel sounds in all 4 quadrants. MSK/Skin: 3+ pitting edema in the bilateral lower extremities to the upper calves.  Some chronic venous stasis changes.  Tenderness to palpation primarily over the anterior aspect of the bilateral lower legs.  Palpable DP pulses bilaterally though assessment somewhat limited by swelling. Neuro: A&Ox3. GCS 15. Moves all extremities.     Procedures   If procedures were preformed on this patient, they are listed below:  Procedures   Laverda Rimes, MD Emergency Medicine PGY-2   Please note that this documentation was produced with the assistance of voice-to-text technology and may contain errors.    Rimes Laverda, MD 04/07/23 JEROME    Dasie Faden, MD 04/07/23 [phone]

## 2023-04-06 NOTE — ED Provider Notes (Signed)
 I saw and evaluated the patient, reviewed the resident's note and I agree with the findings and plan.  EKG Interpretation Date/Time:  Sunday April 06 2023 16:57:08 EST Ventricular Rate:  71 PR Interval:  220 QRS Duration:  174 QT Interval:  475 QTC Calculation: 517 R Axis:   258  Text Interpretation: Sinus rhythm Prolonged PR interval Nonspecific IVCD with LAD Left bundle branch block Abnormal lateral Q waves No significant change since last tracing Confirmed by Dasie Faden ([id number]) on 04/06/2023 5:00:38 PM   Patient is EKG per my interpretation shows sinus rhythm.  Patient here with increased bilateral lower extremity swelling x 2 weeks.  Has also had increasing dyspnea exertion.  History of CHF.  Exam, she has plus bilateral lower extremity edema.  Suspect CHF exacerbation.  Will likely require admission   Dasie Faden, MD 04/06/23 [phone]

## 2023-04-06 NOTE — ED Notes (Signed)
 Peri-care completed, new brief applied, bed pad and sheet changed, pt repositioned.

## 2023-04-06 NOTE — Progress Notes (Signed)
 Civil engineer, contracting Liaison Note  Ms. Crissa Lumpkin is currently being followed by [facility].  Please do not hesitate to call with any questions or concerns.  Ambrosio Junker, RN Nurse Liaison [phone]

## 2023-04-06 NOTE — ED Triage Notes (Signed)
 Pt BIB [facility] from home C/O increased bilateral leg swelling X 1 week and DOE X 2 days.

## 2023-04-07 DIAGNOSIS — E871 Hypo-osmolality and hyponatremia: Secondary | ICD-10-CM | POA: Diagnosis present

## 2023-04-07 DIAGNOSIS — I5023 Acute on chronic systolic (congestive) heart failure: Secondary | ICD-10-CM | POA: Diagnosis not present

## 2023-04-07 DIAGNOSIS — D649 Anemia, unspecified: Secondary | ICD-10-CM | POA: Diagnosis present

## 2023-04-07 LAB — IRON AND TIBC
Iron: 109 ug/dL (ref 28–170)
Saturation Ratios: 36 % — ABNORMAL HIGH (ref 10.4–31.8)
TIBC: 300 ug/dL (ref 250–450)
UIBC: 191 ug/dL

## 2023-04-07 LAB — CBC WITH DIFFERENTIAL/PLATELET
Abs Immature Granulocytes: 0.02 10*3/uL (ref 0.00–0.07)
Basophils Absolute: 0 10*3/uL (ref 0.0–0.1)
Basophils Relative: 1 %
Eosinophils Absolute: 0 10*3/uL (ref 0.0–0.5)
Eosinophils Relative: 1 %
HCT: 36.5 % (ref 36.0–46.0)
Hemoglobin: 12.1 g/dL (ref 12.0–15.0)
Immature Granulocytes: 1 %
Lymphocytes Relative: 24 %
Lymphs Abs: 1 10*3/uL (ref 0.7–4.0)
MCH: 29.5 pg (ref 26.0–34.0)
MCHC: 33.2 g/dL (ref 30.0–36.0)
MCV: 89 fL (ref 80.0–100.0)
Monocytes Absolute: 0.5 10*3/uL (ref 0.1–1.0)
Monocytes Relative: 11 %
Neutro Abs: 2.7 10*3/uL (ref 1.7–7.7)
Neutrophils Relative %: 62 %
Platelets: 199 10*3/uL (ref 150–400)
RBC: 4.1 MIL/uL (ref 3.87–5.11)
RDW: 13.8 % (ref 11.5–15.5)
WBC: 4.2 10*3/uL (ref 4.0–10.5)
nRBC: 0 % (ref 0.0–0.2)

## 2023-04-07 LAB — COMPREHENSIVE METABOLIC PANEL
ALT: 21 U/L (ref 0–44)
AST: 27 U/L (ref 15–41)
Albumin: 3.5 g/dL (ref 3.5–5.0)
Alkaline Phosphatase: 63 U/L (ref 38–126)
Anion gap: 19 — ABNORMAL HIGH (ref 5–15)
BUN: 15 mg/dL (ref 8–23)
CO2: 22 mmol/L (ref 22–32)
Calcium: 12 mg/dL — ABNORMAL HIGH (ref 8.9–10.3)
Chloride: 93 mmol/L — ABNORMAL LOW (ref 98–111)
Creatinine, Ser: 1 mg/dL (ref 0.44–1.00)
GFR, Estimated: 52 mL/min — ABNORMAL LOW (ref >60)
Glucose, Bld: 94 mg/dL (ref 70–99)
Potassium: 3.8 mmol/L (ref 3.5–5.1)
Sodium: 134 mmol/L — ABNORMAL LOW (ref 135–145)
Total Bilirubin: 1.3 mg/dL — ABNORMAL HIGH (ref 0.0–1.2)
Total Protein: 5.8 g/dL — ABNORMAL LOW (ref 6.5–8.1)

## 2023-04-07 LAB — TSH: TSH: 1.211 u[IU]/mL (ref 0.350–4.500)

## 2023-04-07 LAB — OSMOLALITY: Osmolality: 274 mosm/kg — ABNORMAL LOW (ref 275–295)

## 2023-04-07 LAB — PHOSPHORUS: Phosphorus: 2.6 mg/dL (ref 2.5–4.6)

## 2023-04-07 LAB — MRSA NEXT GEN BY PCR, NASAL: MRSA by PCR Next Gen: NOT DETECTED

## 2023-04-07 LAB — MAGNESIUM: Magnesium: 1.8 mg/dL (ref 1.7–2.4)

## 2023-04-07 LAB — APTT: aPTT: 35 s (ref 24–36)

## 2023-04-07 LAB — PROTIME-INR
INR: 1.3 — ABNORMAL HIGH (ref 0.8–1.2)
Prothrombin Time: 16.8 s — ABNORMAL HIGH (ref 11.4–15.2)

## 2023-04-07 LAB — FOLATE: Folate: 12 ng/mL (ref >5.9)

## 2023-04-07 LAB — VITAMIN B12: Vitamin B-12: 227 pg/mL (ref 180–914)

## 2023-04-07 LAB — FERRITIN: Ferritin: 131 ng/mL (ref 11–307)

## 2023-04-07 MED ORDER — NALOXONE HCL 0.4 MG/ML IJ SOLN: 0.4000 mg | INTRAMUSCULAR | Status: DC | PRN

## 2023-04-07 MED ORDER — HYDROCODONE-ACETAMINOPHEN 5-325 MG PO TABS
1.0000 | ORAL_TABLET | Freq: Four times a day (QID) | ORAL | Status: DC | PRN
  Administered 2023-04-07 – 2023-04-08 (×5): 1 via ORAL
  Filled 2023-04-07 (×5): qty 1

## 2023-04-07 MED ORDER — SPIRONOLACTONE 25 MG PO TABS
25.0000 mg | ORAL_TABLET | Freq: Every day | ORAL | Status: DC
  Administered 2023-04-07 – 2023-04-08 (×2): 25 mg via ORAL
  Filled 2023-04-07 (×2): qty 1

## 2023-04-07 MED ORDER — ORAL CARE MOUTH RINSE: 15.0000 mL | OROMUCOSAL | Status: DC | PRN

## 2023-04-07 MED ORDER — CARVEDILOL 3.125 MG PO TABS
3.1250 mg | ORAL_TABLET | Freq: Two times a day (BID) | ORAL | Status: DC
  Administered 2023-04-07 – 2023-04-08 (×3): 3.125 mg via ORAL
  Filled 2023-04-07 (×3): qty 1

## 2023-04-07 MED ORDER — SACUBITRIL-VALSARTAN 24-26 MG PO TABS
1.0000 | ORAL_TABLET | Freq: Two times a day (BID) | ORAL | Status: DC
  Administered 2023-04-07 – 2023-04-08 (×3): 1 via ORAL
  Filled 2023-04-07 (×3): qty 1

## 2023-04-07 MED ORDER — FENTANYL CITRATE PF 50 MCG/ML IJ SOSY
50.0000 ug | PREFILLED_SYRINGE | INTRAMUSCULAR | Status: DC | PRN
  Administered 2023-04-07 – 2023-04-08 (×2): 50 ug via INTRAVENOUS
  Filled 2023-04-07 (×2): qty 1

## 2023-04-07 MED ORDER — LEVOTHYROXINE SODIUM 50 MCG PO TABS
50.0000 ug | ORAL_TABLET | Freq: Every day | ORAL | Status: DC
  Administered 2023-04-07 – 2023-04-08 (×2): 50 ug via ORAL
  Filled 2023-04-07 (×2): qty 1

## 2023-04-07 MED ORDER — ATORVASTATIN CALCIUM 80 MG PO TABS
80.0000 mg | ORAL_TABLET | Freq: Every day | ORAL | Status: DC
  Administered 2023-04-07 (×2): 80 mg via ORAL
  Filled 2023-04-07: qty 1
  Filled 2023-04-07: qty 2

## 2023-04-07 NOTE — Consult Note (Addendum)
 Cardiology Consultation   Patient ID: [name] MRN: [id number]; DOB: 03/22/1924  Admit date: 04/06/2023 Date of Consult: 04/07/2023  PCP:  Stephanie Heritage, NP   [facility] Providers Cardiologist:  Stephanie Portal, MD  Electrophysiologist:  Stephanie Rumple, MD  {   Patient Profile:   Stephanie Atkinson is a 89 y.o. female with a hx of CAD status post CABG x4 2006, hypertension, hyperlipidemia, hypothyroidism, permanent atrial fibrillation, syncope due to slowed A-fib status post Medtronic PPM 06/2017, chronic systolic heart failure, ischemic cardiomyopathy, CKD stage IIIb, hypercalcemia, who is being seen 04/07/2023 for the evaluation of CHF at the request of Stephanie Atkinson.  History of Present Illness:   Stephanie Atkinson with above PMH presented to ER for leg edema.  She reports 2 weeks worsening bilateral lower extremity edema and increased shortness of breath with exertion over the past 48 hours.  She is currently being followed by [facility].  She has been compliant with her medication, takes Lasix  20 mg q48h, 40 mg q48 h, spironolactone , Entresto , and Coreg . She states she barely eats any salt. She states she drinks a lot of fluid at baseline, at least 3 of 16 oz of water daily plus coffee and other fluids. She states she has been urinating a lot at home. She denied any chest pain or syncope. Her daughter states she never had leg edema similarly from CHF in the past. She states her leg swelling had much improved now after lasix  in the ER. She asked how long she needs to be stay. She also mentioned it's hard for her to get around and go to doctor's appointment these days.   Admission labs from 04/06/2023 revealed hyponatremia 128, creatinine 1.23, albumin 3.2, GFR 40.  High sensitive troponin 15 >14.  BNP 339.  CBC with hemoglobin 10.9.  Chest x-ray showed no acute finding.  Subsequent labs today revealed sodium 134, creatinine 1, calcium  12, anion gap 19, GFR 52.  INR 1.3.   Vitamin B12 borderline 227.  Iron panel does not suggest significant iron deficiency.  TSH WNL.  She was admitted to hospital medicine, started IV Lasix  40 mg twice daily.  Cardiology is consulted today for further evaluation.  Per chart review, she is followed by Stephanie Atkinson and Stephanie Atkinson. She has multivessel CAD, underwent CABG in 2006. Cath from 12/11/2010 showed known atretic right internal mammary artery to RCA with patent PCI site of the native RCA as well as percutaneous transluminal angioplasty site of the RPL. Patent radial graft to the first OM and second OM as well as patent LIMA to LAD. No evidence of obstructive coronary artery lesions. She had repeat cath 10/10/12, no culprit lesion for chest pain identified, anatomy largely unchanged, no target for PCI. She is not on ASA due to Eliquis  use, on PTA statin and coreg .   She was noted in A fib since her CABG. She was previously on amiodarone, this was later discontinued in the setting of prolonged PR interval. She has been on Eliquis  for chronic anticoagulation. Due to syncope, she had loop implant in 2014. Due to symptomatic A fib with slowed ventricular response, she had single chamber Medtronic Azure XT SR PPM implanted and ILP explantation on 07/16/2017 by Stephanie Atkinson. PPM interrogation last on 01/10/23 by Stephanie Atkinson showed normal device function, battery 74 mo, permanent A fib with slowed VR and nearly 100% ventricular paced rhythm. Lead measurements are stable. Heart rate histogram is favorable. No clinically significant  episodes of high ventricular rate. She is on eliquis  2.5mg  bid PTA due to body weight and age.   She developed acute heart failure with reduced left ventricular systolic function in October 2023. Echo 12/11/21 showed LVEF 30-35%, global hypokinesis, severely reduced RV, severe LAE and RAE, mild MR, mild to mod TR. After treatment with Entresto , carvedilol , Jardiance , spironolactone  her symptoms of heart failure improved. Repeat  Echo from 05/21/22 showed LVEF improved to 45-50%, global hypokinesis, normal RV and PASP, severe LAE and RAE, mild to mod MR and TR. Most Echo from 01/09/23 showed LVEF 35-40%.   She had recent hospitalizations in 12/2022 twice, first time was for near syncope due to orthostasis and lasix  was reduced to 20mg  PRN daily;  second time was for acute metabolic encephalopathy, felt due to UTI versus hypercalcemia, was treated with IVF. She was hospitalized 02/2023 for sepsis 2/2 UTI and AKI. She was hypotensive initially but later BP normalized and she was discharged on coreg , entresto , and spironolactone  for GDMT, SGLT2I stopped due to recurrent UTIs. She was seen by cardiology during last hospitalization due to hypotension, which was felt due to sepsis.    Past Medical History:  Diagnosis Date   Chronic kidney disease, stage 3 (HCC)    Coronary artery disease    History of UTI    Hx of CABG 2006   LIMA-LAD, free RIMA-PDA, Radial-OM1-OM2   Hyperlipidemia    Hypertension    Hypothyroid    MI, old        PAF (paroxysmal atrial fibrillation) (HCC)    was on amio, d/c'd due to prolonged PR interval, rate control   Presence of permanent cardiac pacemaker 07/16/2017   Presence of stent in right coronary artery 08/2010   RIMA-PDA occluded, DES stent placed   Syncope 08/2010   bradycardic, beta blocker decreased, also felt to be dehydrated    Past Surgical History:  Procedure Laterality Date   CARDIAC CATHETERIZATION  12/10/2010   Stephanie. Myrtie Atkinson Atkinson, revealing a atretic bypass to her right with patent distal RCA stent   CAROTID STENT  2012   CHOLECYSTECTOMY     CORONARY ARTERY BYPASS GRAFT  2006   LIMA-LAD, free RIMA-PDA, Radial-OM1-OM2   DOPPLER ECHOCARDIOGRAPHY  09/27/2010   mild asymmetric left ventricular hypertrophy, left ventricular systolic function is low normal, ejection fraction = 50-55%, the LA is moderate dilated, the RA is mildly dilated, no significant valvular disease   INSERT /  REPLACE / REMOVE PACEMAKER  07/16/2017   LEFT HEART CATHETERIZATION WITH CORONARY/GRAFT ANGIOGRAM N/A 10/12/2012   Procedure: LEFT HEART CATHETERIZATION WITH Estella Helling;  Surgeon: Arleen Lacer, MD;  Location: [facility] CATH LAB;  Service: Cardiovascular;  Laterality: N/A;   LOOP RECORDER IMPLANT N/A 10/15/2012   Procedure: LOOP RECORDER IMPLANT;  Surgeon: Stephanie Rumple, MD;  Location: [facility] CATH LAB;  Service: Cardiovascular;  Laterality: N/A;   LOOP RECORDER REMOVAL  07/16/2017   LOOP RECORDER REMOVAL N/A 07/16/2017   Procedure: LOOP RECORDER REMOVAL;  Surgeon: Stephanie Rumple, MD;  Location: [facility] INVASIVE CV LAB;  Service: Cardiovascular;  Laterality: N/A;   NM MYOVIEW  LTD  11/29/2008   post stress left ventricle is normal in size, post stress ejection fraction is 67% global left ventricular systolic function is normal, normal myocardial perfusion study, abnormal myocardial perfusion study, low risk scan   PACEMAKER IMPLANT N/A 07/16/2017   Procedure: PACEMAKER IMPLANT;  Surgeon: Stephanie Rumple, MD;  Location: [facility] INVASIVE CV LAB;  Service: Cardiovascular;  Laterality: N/A;  THYROIDECTOMY     TOTAL HIP ARTHROPLASTY Right      Home Medications:  Prior to Admission medications   Medication Sig Start Date End Date Taking? Authorizing Provider  acetaminophen  (TYLENOL ) 500 MG tablet Take 500 mg by mouth See admin instructions. Take 500 mg in am, 1000 mg at 2 pm, 1000 mg 6 pm and 500 mg in pm by mouth.   Yes [provider]  atorvastatin  (LIPITOR ) 80 MG tablet Take 80 mg by mouth at bedtime.    Yes [provider]  carvedilol  (COREG ) 3.125 MG tablet TAKE 1 TABLET BY MOUTH 2 TIMES DAILY. Patient taking differently: Take 3.125 mg by mouth every 12 (twelve) hours. 03/13/23  Yes Croitoru, Mihai, MD  Diclofenac  Sodium (VOLTAREN  EX) Apply 1 Dose topically daily as needed (Pain).   Yes [provider]  ELIQUIS  2.5 MG TABS tablet TAKE 1 TABLET BY MOUTH TWICE A  DAY Patient taking differently: Take 2.5 mg by mouth every 12 (twelve) hours. 03/13/23  Yes Croitoru, Mihai, MD  furosemide  (LASIX ) 20 MG tablet Take 1 tablet (20 mg total) by mouth daily. Take two tablets in case of weight gain 2 to 3 lbs in 24 hrs or 5 lbs in 7 days, until weight back to baseline. Patient taking differently: Take 20-40 mg by mouth See admin instructions. Take 20mg  by mouth on Tuesday, Thursday, Saturday, Sunday. Take 40 mg by mouth on Monday, Wednesday, Friday. 02/10/23  Yes Croitoru, Mihai, MD  HYDROcodone -acetaminophen  (NORCO/VICODIN) 5-325 MG tablet Take 1 tablet by mouth See admin instructions. Take 1 tablet by mouth twice a day. May take a third dose if needed for breakthrough pain. 03/03/23  Yes [provider]  levothyroxine  (SYNTHROID , LEVOTHROID) 50 MCG tablet Take 50 mcg by mouth daily before breakfast.    Yes [provider]  lidocaine  (LIDODERM ) 5 % Place 1 patch onto the skin daily as needed (Pain). 02/25/23  Yes [provider]  Polyethyl Glycol-Propyl Glycol (SYSTANE OP) Place 1 drop into both eyes daily as needed (dry eye).   Yes [provider]  sacubitril -valsartan  (ENTRESTO ) 24-26 MG Take 1 tablet by mouth 2 (two) times daily. 02/06/23  Yes Croitoru, Mihai, MD  spironolactone  (ALDACTONE ) 25 MG tablet Take 1 tablet (25 mg total) by mouth daily. 01/22/23  Yes Avanell Leigh, MD    Inpatient Medications: Scheduled Meds:  apixaban   2.5 mg Oral BID   atorvastatin   80 mg Oral QHS   carvedilol   3.125 mg Oral BID WC   furosemide   40 mg Intravenous BID   levothyroxine   50 mcg Oral QAC breakfast   sacubitril -valsartan   1 tablet Oral BID   Continuous Infusions:  PRN Meds: acetaminophen  **OR** acetaminophen , fentaNYL  (SUBLIMAZE ) injection, HYDROcodone -acetaminophen , melatonin, naLOXone  (NARCAN )  injection, ondansetron  (ZOFRAN ) IV  Allergies:    Allergies  Allergen Reactions   Penicillins Hives   Sulfa Antibiotics Hives and  Swelling   Gabapentin  Other (See Comments)    Confusion   Lyrica [Pregabalin] Other (See Comments)    Confusion    Social History:   Social History   Socioeconomic History   Marital status: Single    Spouse name: Not on file   Number of children: Not on file   Years of education: Not on file   Highest education level: Not on file  Occupational History   Not on file  Tobacco Use   Smoking status: Never   Smokeless tobacco: Current    Types: Snuff   Tobacco comments:  03/08/2023 patient still use Snuff  Vaping Use   Vaping status: Never Used  Substance and Sexual Activity   Alcohol  use: No   Drug use: No   Sexual activity: Not on file  Other Topics Concern   Not on file  Social History Narrative   Not on file   Social Drivers of Health   Financial Resource Strain: Low Risk  (03/04/2023)   Overall Financial Resource Strain (CARDIA)    Difficulty of Paying Living Expenses: Not very hard  Food Insecurity: No Food Insecurity (04/07/2023)   Hunger Vital Sign    Worried About Running Out of Food in the Last Year: Never true    Ran Out of Food in the Last Year: Never true  Transportation Needs: No Transportation Needs (04/07/2023)   PRAPARE - Administrator, Civil Service (Medical): No    Lack of Transportation (Non-Medical): No  Physical Activity: Not on file  Stress: Not on file  Social Connections: Socially Isolated (04/07/2023)   Social Connection and Isolation Panel [NHANES]    Frequency of Communication with Friends and Family: More than three times a week    Frequency of Social Gatherings with Friends and Family: Once a week    Attends Religious Services: Never    Database administrator or Organizations: No    Attends Banker Meetings: Never    Marital Status: Widowed  Intimate Partner Violence: Not At Risk (04/07/2023)   Humiliation, Afraid, Rape, and Kick questionnaire    Fear of Current or Ex-Partner: No    Emotionally Abused: No     Physically Abused: No    Sexually Abused: No    Family History:    Family History  Problem Relation Age of Onset   CAD Mother    CAD Maternal Grandmother      ROS:  Constitutional: Denied fever, chills, malaise, night sweats Eyes: Denied vision change or loss Ears/Nose/Mouth/Throat: Denied ear ache, sore throat, coughing, sinus pain Cardiovascular: see HPI  Respiratory: see HPI Gastrointestinal: Denied nausea, vomiting, abdominal pain, diarrhea Genital/Urinary: Denied dysuria, hematuria, urinary frequency/urgency Musculoskeletal: Denied muscle ache, joint pain, weakness Skin: Denied rash, wound Neuro: Denied headache, dizziness, syncope Psych: Denied history of depression/anxiety  Endocrine: Denied history of diabetes   Physical Exam/Data:   Vitals:   04/07/23 1100 04/07/23 1200 04/07/23 1357 04/07/23 1400  BP: 126/65  (!) 107/55 (!) 116/44  Pulse: 60 60  61  Resp: 14 13  17   Temp:  97.6 F (36.4 C) (!) 97 F (36.1 C)   TempSrc:      SpO2: 100% 100%  100%  Weight:      Height:        Intake/Output Summary (Last 24 hours) at 04/07/2023 1502 Last data filed at 04/07/2023 1448 Gross per 24 hour  Intake --  Output 450 ml  Net -450 ml      04/06/2023    4:55 PM 03/10/2023   12:07 PM 03/10/2023    7:12 AM  Last 3 Weights  Weight (lbs) 130 lb 126 lb 1.7 oz 127 lb 6.8 oz  Weight (kg) 58.968 kg 57.2 kg 57.8 kg     Body mass index is 23.78 kg/m.   Vitals:  Vitals:   04/07/23 1357 04/07/23 1400  BP: (!) 107/55 (!) 116/44  Pulse:  61  Resp:  17  Temp: (!) 97 F (36.1 C)   SpO2:  100%   General Appearance: In no apparent distress, laying in bed  HEENT: Normocephalic, atraumatic.  Neck: Supple, trachea midline, no JVDs Cardiovascular: Irregularly, S1S2, no murmur Respiratory: Resting breathing unlabored, lungs sounds clear diminished at base bilaterally, no use of accessory muscles. On room air.   Gastrointestinal: Bowel sounds positive, abdomen  soft Extremities: Able to move all extremities in bed without difficulty, BLE 1- edema  Musculoskeletal: Normal muscle bulk and tone Skin: Intact, warm, dry. No rashes  Neurologic: Alert, oriented to person, place and time. no gross focal neuro deficit Psychiatric: Normal affect. Mood is appropriate.     EKG:  The EKG was personally reviewed and demonstrates:    EKG from 04/06/23 showed A fib VR of 71bpm, LBBB  Telemetry:  Telemetry was personally reviewed and demonstrates:    A fib with controlled ventricular response, intermittently paced   Relevant CV Studies:   Echo from 01/09/23:  1. Abnormal (paradoxical) septal motion, consistent with RV pacemaker.      . Left ventricular ejection fraction, by estimation, is 35 to 40%. The  left ventricle has moderately decreased function. The left ventricle  demonstrates global hypokinesis. There is mild left ventricular  hypertrophy. Left ventricular diastolic  parameters are indeterminate.   2. Right ventricular systolic function is normal. The right ventricular  size is normal. There is normal pulmonary artery systolic pressure.   3. Left atrial size was severely dilated.   4. Right atrial size was severely dilated.   5. The mitral valve is degenerative. Trivial mitral valve regurgitation.  No evidence of mitral stenosis.   6. The aortic valve is tricuspid. Aortic valve regurgitation is trivial.  No aortic stenosis is present.   7. Pulmonic valve regurgitation is moderate.    Laboratory Data:  High Sensitivity Troponin:   Recent Labs  Lab 04/06/23 1743 04/06/23 2043  TROPONINIHS 15 14     Chemistry Recent Labs  Lab 04/06/23 1743 04/06/23 2043 04/07/23 0434  NA 128*  --  134*  K 4.2  --  3.8  CL 99  --  93*  CO2 22  --  22  GLUCOSE 91  --  94  BUN 16  --  15  CREATININE 1.23*  --  1.00  CALCIUM  10.3  --  12.0*  MG  --  1.9 1.8  GFRNONAA 40*  --  52*  ANIONGAP 7  --  19*    Recent Labs  Lab 04/06/23 1743  04/07/23 0434  PROT 5.6* 5.8*  ALBUMIN 3.2* 3.5  AST 26 27  ALT 22 21  ALKPHOS 61 63  BILITOT 0.8 1.3*   Lipids No results for input(s): "CHOL", "TRIG", "HDL", "LABVLDL", "LDLCALC", "CHOLHDL" in the last 168 hours.  Hematology Recent Labs  Lab 04/06/23 1743 04/07/23 0434  WBC 4.1 4.2  RBC 3.76* 4.10  HGB 10.9* 12.1  HCT 34.4* 36.5  MCV 91.5 89.0  MCH 29.0 29.5  MCHC 31.7 33.2  RDW 14.0 13.8  PLT 174 199   Thyroid   Recent Labs  Lab 04/07/23 0434  TSH 1.211    BNP Recent Labs  Lab 04/06/23 1744  BNP 339.2*    DDimer No results for input(s): "DDIMER" in the last 168 hours.   Radiology/Studies:  DG Chest Portable 1 View Result Date: 04/06/2023 CLINICAL DATA:  Dyspnea, bilateral lower extremity swelling EXAM: PORTABLE CHEST 1 VIEW COMPARISON:  03/08/2023 FINDINGS: Single frontal view of the chest demonstrates postsurgical changes from CABG. Single lead pacer unchanged. Cardiac silhouette remains enlarged. No airspace disease, effusion, or pneumothorax. No acute bony abnormalities. IMPRESSION:  1. Stable enlarged cardiac silhouette. 2. No acute intrathoracic process. Electronically Signed   By: Bobbye Burrow M.D.   On: 04/06/2023 18:04     Assessment and Plan:   Ischemic cardiomyopathy Acute on chronic systolic and diastolic heart failure -Presented with 2 weeks worsening BLE edema, to 48 hours progressive DOE -BNP 339, high sensitive troponin negative x 2 -Chest x-ray revealed no acute finding -Echo from 01/09/2023 with LVEF 35 to 40% -on PTA Lasix  20 mg and 40 mg every other day dosing, here started on IV Lasix  40 mg twice daily, no track of intake and output or daily weight yet, please track -Hyponatremia and renal index improving with diuresis, continue IV diuresis at this dose, trend daily BMP -GDMT: Continue PTA Coreg , Entresto , will resume spironolactone ; SGLT2i due to recurrent UTI in the past - Advised the patient to cut down overall fluid intake to less  than 1.5L daily, see if this would prevent further CHF exacerbation. Will hold off up- titrating her Lasix  as she had near syncope due to orthostatic hypotension in November 2024. Defer referral to CRT-P discussion to outpatient cardiologist. She reports difficulty moving around and has not been able to get to many medical appointment these days.   CAD s/p CABG 2006 - she has no chest pain - Hs trop negative x2 - continue PTA statin and Coreg , no ASA due to Eliquis  use   Permanent A-fib  - Ventricular rate controlled, has PPM since 2019  - asked Medtronic Rep to interrogate device , normal function , see reports    CKD IIIb Hypercalcemia  Hypothyroidism  Debility  - per primary team     Risk Assessment/Risk Scores:   New [place]  Heart Association (NYHA) Functional Class NYHA Class III  CHA2DS2-VASc Score = 6   This indicates a 9.7% annual risk of stroke. The patient's score is based upon: CHF History: 1 HTN History: 1 Diabetes History: 0 Stroke History: 0 Vascular Disease History: 1 Age Score: 2 Gender Score: 1    For questions or updates, please contact [facility] Please consult [email or web address] for contact info under    Signed, Elmo Shumard, NP  04/07/2023 3:02 PM

## 2023-04-07 NOTE — Progress Notes (Signed)
 Heart Failure Navigator Progress Note  Assessed for Heart & Vascular TOC clinic readiness.  Patient does not meet criteria due to patient has a scheduled [facility] appointment on 04/11/2023. .   Navigator will sign off at this time.   Randie Bustle, BSN, Scientist, clinical (histocompatibility and immunogenetics) Only

## 2023-04-07 NOTE — Progress Notes (Signed)
 Added data for the wrong pt. Mistake corrected.

## 2023-04-07 NOTE — Plan of Care (Signed)

## 2023-04-07 NOTE — Progress Notes (Signed)
 PROGRESS NOTE    Stephanie Atkinson  JXB:[id number] DOB: [birth year] DOA: 04/06/2023 PCP: Yvonnie Heritage, NP   Brief Narrative:  89 year old female with history of chronic systolic heart failure, CKD stage IIIb, CAD status post CABG in 2006, essential hypertension, hyperlipidemia, acquired hypothyroidism, paroxysmal A-fib complicated by sick sinus syndrome status post pacemaker in 2019 chronically anticoagulated on Eliquis , chronic hypercalcemia and recent admission from 03/08/2023-03/11/2023 for acute metabolic encephalopathy from septic shock from UTI treated with IV antibiotics along with AKI and CHF presented with bilateral lower extremity swelling.  On presentation, BNP was 339, creatinine of 1.23 and high-sensitivity troponins were 15 and 14.  EKG showed no ST changes.  Chest x-ray was unremarkable.  She was started on IV Lasix .  Assessment & Plan:   Acute on chronic systolic heart failure Hypertension Hyperlipidemia -Echo in November 2024 had shown EF of 35 to 40%.  Presented with worsening lower extremity swelling. -Continue IV Lasix  along with oral Coreg  and Entresto .  Spironolactone  on hold for now.  Consult cardiology -Continue statin  Hyponatremia -Possibly from hypervolemia and fluid overload.  Sodium improving to 134 this morning.  CKD stage IIIb -Creatinine currently stable.  Monitor  Acquired hypothyroidism -continue levothyroxine   Paroxysmal A-fib History of sick sinus syndrome status post pacemaker in 2019 -Currently rate controlled.  Continue Eliquis   Chronic hypercalcemia -Has had chronic hypercalcemia.  Questionable cause.  Monitor.  Continue Lasix   CAD with history of bypass graft -Currently stable.  Continue Coreg  and statin.  Follow cardiology recommendations  Physical deconditioning Goals of care -PT eval.  Patient follows with [facility] outpatient palliative care   DVT prophylaxis: Eliquis  Code Status: DNR Family Communication: None at bedside Disposition  Plan: Status is: Inpatient Remains inpatient appropriate because: Of severity of illness    Consultants: Consult cardiology  Procedures: None  Antimicrobials: None   Subjective: Patient seen and examined at bedside.  Feels slightly better, feels that her leg swelling is improving.  Slightly short of breath with exertion.  No chest pain, fever or vomiting reported.  Objective: Vitals:   04/07/23 0330 04/07/23 0429 04/07/23 0715 04/07/23 0730  BP: (!) 151/60  (!) 183/69 (!) 171/77  Pulse: 63 60 61 (!) 59  Resp: 15 15 14  (!) 21  Temp:  98.1 F (36.7 C)    TempSrc:  Oral    SpO2: 100% 100% 100% 100%  Weight:      Height:       No intake or output data in the 24 hours ending 04/07/23 0818 Filed Weights   04/06/23 1655  Weight: 59 kg    Examination:  General exam: Appears calm and comfortable.  Elderly female lying in bed.  On room air. Respiratory system: Bilateral decreased breath sounds at bases with scattered crackles Cardiovascular system: S1 & S2 heard, Rate controlled Gastrointestinal system: Abdomen is nondistended, soft and nontender. Normal bowel sounds heard. Extremities: No cyanosis, clubbing; bilateral lower extremity edema present Central nervous system: Alert and oriented.  Slow to respond.  Poor historian.  No focal neurological deficits. Moving extremities Skin: No rashes, lesions or ulcers Psychiatry: Flat affect.  Not agitated.    Data Reviewed: I have personally reviewed following labs and imaging studies  CBC: Recent Labs  Lab 04/06/23 1743 04/07/23 0434  WBC 4.1 4.2  NEUTROABS 2.3 2.7  HGB 10.9* 12.1  HCT 34.4* 36.5  MCV 91.5 89.0  PLT 174 199   Basic Metabolic Panel: Recent Labs  Lab 04/06/23 1743 04/06/23 2043 04/07/23 0434  NA 128*  --  134*  K 4.2  --  3.8  CL 99  --  93*  CO2 22  --  22  GLUCOSE 91  --  94  BUN 16  --  15  CREATININE 1.23*  --  1.00  CALCIUM  10.3  --  12.0*  MG  --  1.9 1.8  PHOS  --   --  2.6    GFR: Estimated Creatinine Clearance: 26 mL/min (by C-G formula based on SCr of 1 mg/dL). Liver Function Tests: Recent Labs  Lab 04/06/23 1743 04/07/23 0434  AST 26 27  ALT 22 21  ALKPHOS 61 63  BILITOT 0.8 1.3*  PROT 5.6* 5.8*  ALBUMIN 3.2* 3.5   No results for input(s): "LIPASE", "AMYLASE" in the last 168 hours. No results for input(s): "AMMONIA" in the last 168 hours. Coagulation Profile: Recent Labs  Lab 04/07/23 0434  INR 1.3*   Cardiac Enzymes: No results for input(s): "CKTOTAL", "CKMB", "CKMBINDEX", "TROPONINI" in the last 168 hours. BNP (last 3 results) No results for input(s): "PROBNP" in the last 8760 hours. HbA1C: No results for input(s): "HGBA1C" in the last 72 hours. CBG: No results for input(s): "GLUCAP" in the last 168 hours. Lipid Profile: No results for input(s): "CHOL", "HDL", "LDLCALC", "TRIG", "CHOLHDL", "LDLDIRECT" in the last 72 hours. Thyroid  Function Tests: Recent Labs    04/07/23 0434  TSH 1.211   Anemia Panel: Recent Labs    04/07/23 0434  VITAMINB12 227  FOLATE 12.0  FERRITIN 131  TIBC 300  IRON 109   Sepsis Labs: No results for input(s): "PROCALCITON", "LATICACIDVEN" in the last 168 hours.  No results found for this or any previous visit (from the past 240 hours).       Radiology Studies: DG Chest Portable 1 View Result Date: 04/06/2023 CLINICAL DATA:  Dyspnea, bilateral lower extremity swelling EXAM: PORTABLE CHEST 1 VIEW COMPARISON:  03/08/2023 FINDINGS: Single frontal view of the chest demonstrates postsurgical changes from CABG. Single lead pacer unchanged. Cardiac silhouette remains enlarged. No airspace disease, effusion, or pneumothorax. No acute bony abnormalities. IMPRESSION: 1. Stable enlarged cardiac silhouette. 2. No acute intrathoracic process. Electronically Signed   By: Bobbye Burrow M.D.   On: 04/06/2023 18:04        Scheduled Meds:  apixaban   2.5 mg Oral BID   atorvastatin   80 mg Oral QHS   carvedilol    3.125 mg Oral BID WC   furosemide   40 mg Intravenous BID   levothyroxine   50 mcg Oral QAC breakfast   sacubitril -valsartan   1 tablet Oral BID   Continuous Infusions:        Audria Leather, MD [facility] 04/07/2023, 8:18 AM

## 2023-04-08 ENCOUNTER — Emergency Department (HOSPITAL_COMMUNITY): Payer: Medicare PPO

## 2023-04-08 ENCOUNTER — Observation Stay (HOSPITAL_COMMUNITY)
Admission: EM | Admit: 2023-04-08 | Discharge: 2023-04-24 | Disposition: A | Discharge status: Home / Self Care | Payer: Medicare PPO | Attending: Family Medicine | Admitting: Family Medicine

## 2023-04-08 DIAGNOSIS — I639 Cerebral infarction, unspecified: Secondary | ICD-10-CM | POA: Diagnosis not present

## 2023-04-08 DIAGNOSIS — R519 Headache, unspecified: Secondary | ICD-10-CM | POA: Diagnosis not present

## 2023-04-08 DIAGNOSIS — Z6822 Body mass index (BMI) 22.0-22.9, adult: Secondary | ICD-10-CM | POA: Insufficient documentation

## 2023-04-08 DIAGNOSIS — I4821 Permanent atrial fibrillation: Secondary | ICD-10-CM | POA: Insufficient documentation

## 2023-04-08 DIAGNOSIS — N390 Urinary tract infection, site not specified: Secondary | ICD-10-CM | POA: Diagnosis not present

## 2023-04-08 DIAGNOSIS — R55 Syncope and collapse: Secondary | ICD-10-CM | POA: Insufficient documentation

## 2023-04-08 DIAGNOSIS — Z950 Presence of cardiac pacemaker: Secondary | ICD-10-CM | POA: Insufficient documentation

## 2023-04-08 DIAGNOSIS — E785 Hyperlipidemia, unspecified: Secondary | ICD-10-CM | POA: Diagnosis not present

## 2023-04-08 DIAGNOSIS — N1832 Chronic kidney disease, stage 3b: Secondary | ICD-10-CM | POA: Diagnosis not present

## 2023-04-08 DIAGNOSIS — I25810 Atherosclerosis of coronary artery bypass graft(s) without angina pectoris: Secondary | ICD-10-CM | POA: Diagnosis present

## 2023-04-08 DIAGNOSIS — I480 Paroxysmal atrial fibrillation: Secondary | ICD-10-CM | POA: Insufficient documentation

## 2023-04-08 DIAGNOSIS — R001 Bradycardia, unspecified: Secondary | ICD-10-CM | POA: Diagnosis present

## 2023-04-08 DIAGNOSIS — Z79899 Other long term (current) drug therapy: Secondary | ICD-10-CM | POA: Insufficient documentation

## 2023-04-08 DIAGNOSIS — I130 Hypertensive heart and chronic kidney disease with heart failure and stage 1 through stage 4 chronic kidney disease, or unspecified chronic kidney disease: Secondary | ICD-10-CM | POA: Insufficient documentation

## 2023-04-08 DIAGNOSIS — R531 Weakness: Principal | ICD-10-CM | POA: Insufficient documentation

## 2023-04-08 DIAGNOSIS — I5023 Acute on chronic systolic (congestive) heart failure: Secondary | ICD-10-CM | POA: Insufficient documentation

## 2023-04-08 DIAGNOSIS — I10 Essential (primary) hypertension: Secondary | ICD-10-CM | POA: Diagnosis present

## 2023-04-08 DIAGNOSIS — N281 Cyst of kidney, acquired: Secondary | ICD-10-CM | POA: Diagnosis not present

## 2023-04-08 DIAGNOSIS — E034 Atrophy of thyroid (acquired): Secondary | ICD-10-CM | POA: Diagnosis not present

## 2023-04-08 DIAGNOSIS — N179 Acute kidney failure, unspecified: Secondary | ICD-10-CM | POA: Diagnosis not present

## 2023-04-08 DIAGNOSIS — I2510 Atherosclerotic heart disease of native coronary artery without angina pectoris: Secondary | ICD-10-CM | POA: Diagnosis not present

## 2023-04-08 DIAGNOSIS — J810 Acute pulmonary edema: Secondary | ICD-10-CM | POA: Insufficient documentation

## 2023-04-08 DIAGNOSIS — E039 Hypothyroidism, unspecified: Secondary | ICD-10-CM | POA: Diagnosis present

## 2023-04-08 DIAGNOSIS — R2689 Other abnormalities of gait and mobility: Secondary | ICD-10-CM | POA: Insufficient documentation

## 2023-04-08 DIAGNOSIS — K5730 Diverticulosis of large intestine without perforation or abscess without bleeding: Secondary | ICD-10-CM | POA: Diagnosis not present

## 2023-04-08 DIAGNOSIS — I6782 Cerebral ischemia: Secondary | ICD-10-CM | POA: Diagnosis not present

## 2023-04-08 DIAGNOSIS — E860 Dehydration: Secondary | ICD-10-CM | POA: Insufficient documentation

## 2023-04-08 DIAGNOSIS — E871 Hypo-osmolality and hyponatremia: Secondary | ICD-10-CM | POA: Insufficient documentation

## 2023-04-08 DIAGNOSIS — I5032 Chronic diastolic (congestive) heart failure: Secondary | ICD-10-CM | POA: Diagnosis present

## 2023-04-08 DIAGNOSIS — E43 Unspecified severe protein-calorie malnutrition: Secondary | ICD-10-CM | POA: Insufficient documentation

## 2023-04-08 DIAGNOSIS — I5021 Acute systolic (congestive) heart failure: Secondary | ICD-10-CM | POA: Diagnosis present

## 2023-04-08 DIAGNOSIS — M1990 Unspecified osteoarthritis, unspecified site: Secondary | ICD-10-CM | POA: Insufficient documentation

## 2023-04-08 LAB — BASIC METABOLIC PANEL
Anion gap: 9 (ref 5–15)
Anion gap: 13 (ref 5–15)
BUN: 16 mg/dL (ref 8–23)
BUN: 18 mg/dL (ref 8–23)
CO2: 27 mmol/L (ref 22–32)
CO2: 25 mmol/L (ref 22–32)
Calcium: 10.3 mg/dL (ref 8.9–10.3)
Calcium: 10.8 mg/dL — ABNORMAL HIGH (ref 8.9–10.3)
Chloride: 98 mmol/L (ref 98–111)
Chloride: 95 mmol/L — ABNORMAL LOW (ref 98–111)
Creatinine, Ser: 1.32 mg/dL — ABNORMAL HIGH (ref 0.44–1.00)
Creatinine, Ser: 1.58 mg/dL — ABNORMAL HIGH (ref 0.44–1.00)
GFR, Estimated: 37 mL/min — ABNORMAL LOW (ref >60)
GFR, Estimated: 30 mL/min — ABNORMAL LOW (ref >60)
Glucose, Bld: 107 mg/dL — ABNORMAL HIGH (ref 70–99)
Glucose, Bld: 115 mg/dL — ABNORMAL HIGH (ref 70–99)
Potassium: 3.5 mmol/L (ref 3.5–5.1)
Potassium: 4.2 mmol/L (ref 3.5–5.1)
Sodium: 134 mmol/L — ABNORMAL LOW (ref 135–145)
Sodium: 133 mmol/L — ABNORMAL LOW (ref 135–145)

## 2023-04-08 LAB — URINALYSIS, ROUTINE W REFLEX MICROSCOPIC
Bilirubin Urine: NEGATIVE
Glucose, UA: NEGATIVE mg/dL
Hgb urine dipstick: NEGATIVE
Ketones, ur: NEGATIVE mg/dL
Nitrite: NEGATIVE
Protein, ur: NEGATIVE mg/dL
Specific Gravity, Urine: 1.006 (ref 1.005–1.030)
pH: 6 (ref 5.0–8.0)

## 2023-04-08 LAB — HEPATIC FUNCTION PANEL
ALT: 25 U/L (ref 0–44)
AST: 28 U/L (ref 15–41)
Albumin: 3.7 g/dL (ref 3.5–5.0)
Alkaline Phosphatase: 66 U/L (ref 38–126)
Bilirubin, Direct: 0.2 mg/dL (ref 0.0–0.2)
Indirect Bilirubin: 0.7 mg/dL (ref 0.3–0.9)
Total Bilirubin: 0.9 mg/dL (ref 0.0–1.2)
Total Protein: 6.5 g/dL (ref 6.5–8.1)

## 2023-04-08 LAB — CBC
HCT: 38 % (ref 36.0–46.0)
Hemoglobin: 12.6 g/dL (ref 12.0–15.0)
MCH: 31 pg (ref 26.0–34.0)
MCHC: 33.2 g/dL (ref 30.0–36.0)
MCV: 93.4 fL (ref 80.0–100.0)
Platelets: 205 10*3/uL (ref 150–400)
RBC: 4.07 MIL/uL (ref 3.87–5.11)
RDW: 14.2 % (ref 11.5–15.5)
WBC: 6.4 10*3/uL (ref 4.0–10.5)
nRBC: 0 % (ref 0.0–0.2)

## 2023-04-08 LAB — TROPONIN I (HIGH SENSITIVITY)
Troponin I (High Sensitivity): 18 ng/L — ABNORMAL HIGH (ref <18)
Troponin I (High Sensitivity): 15 ng/L (ref <18)

## 2023-04-08 LAB — LIPASE, BLOOD: Lipase: 67 U/L — ABNORMAL HIGH (ref 11–51)

## 2023-04-08 LAB — MAGNESIUM: Magnesium: 1.7 mg/dL (ref 1.7–2.4)

## 2023-04-08 MED ORDER — APIXABAN 2.5 MG PO TABS
2.5000 mg | ORAL_TABLET | Freq: Two times a day (BID) | ORAL | Status: DC
  Administered 2023-04-09 – 2023-04-24 (×32): 2.5 mg via ORAL
  Filled 2023-04-08 (×32): qty 1

## 2023-04-08 MED ORDER — FOSFOMYCIN TROMETHAMINE 3 G PO PACK
3.0000 g | PACK | Freq: Once | ORAL | Status: AC
  Administered 2023-04-09: 3 g via ORAL
  Filled 2023-04-08: qty 3

## 2023-04-08 MED ORDER — PROCHLORPERAZINE EDISYLATE 10 MG/2ML IJ SOLN
5.0000 mg | Freq: Four times a day (QID) | INTRAMUSCULAR | Status: DC | PRN
Start: 2023-04-08 — End: 2023-04-24

## 2023-04-08 MED ORDER — MELATONIN 5 MG PO TABS
5.0000 mg | ORAL_TABLET | Freq: Every evening | ORAL | Status: DC | PRN
  Administered 2023-04-09 – 2023-04-16 (×7): 5 mg via ORAL
  Filled 2023-04-08 (×8): qty 1

## 2023-04-08 MED ORDER — NITROGLYCERIN 0.4 MG SL SUBL
SUBLINGUAL_TABLET | SUBLINGUAL | Status: AC
  Filled 2023-04-08: qty 3

## 2023-04-08 MED ORDER — LIDOCAINE 5 % EX PTCH
1.0000 | MEDICATED_PATCH | Freq: Every day | CUTANEOUS | Status: DC | PRN
  Filled 2023-04-08 (×2): qty 1

## 2023-04-08 MED ORDER — ACETAMINOPHEN 325 MG PO TABS
650.0000 mg | ORAL_TABLET | Freq: Four times a day (QID) | ORAL | Status: DC | PRN
  Administered 2023-04-09 – 2023-04-24 (×14): 650 mg via ORAL
  Filled 2023-04-08 (×14): qty 2

## 2023-04-08 MED ORDER — HYDROCODONE-ACETAMINOPHEN 5-325 MG PO TABS
1.0000 | ORAL_TABLET | Freq: Once | ORAL | Status: AC
  Administered 2023-04-08: 1 via ORAL
  Filled 2023-04-08: qty 1

## 2023-04-08 MED ORDER — POLYETHYLENE GLYCOL 3350 17 G PO PACK
17.0000 g | PACK | Freq: Every day | ORAL | Status: DC | PRN
  Administered 2023-04-09 – 2023-04-19 (×3): 17 g via ORAL
  Filled 2023-04-08 (×3): qty 1

## 2023-04-08 MED ORDER — FUROSEMIDE 20 MG PO TABS: 20.0000 mg | ORAL_TABLET | ORAL | Status: DC

## 2023-04-08 MED ORDER — LEVOTHYROXINE SODIUM 50 MCG PO TABS
50.0000 ug | ORAL_TABLET | Freq: Every day | ORAL | Status: DC
  Administered 2023-04-09 – 2023-04-24 (×16): 50 ug via ORAL
  Filled 2023-04-08: qty 1
  Filled 2023-04-08: qty 2
  Filled 2023-04-08 (×15): qty 1
  Filled 2023-04-08: qty 2

## 2023-04-08 NOTE — ED Notes (Signed)
Patient attempted to give urine sample at this time

## 2023-04-08 NOTE — ED Notes (Signed)
Assumed pt care.

## 2023-04-08 NOTE — ED Triage Notes (Signed)
Pt BIB [facility] from home with reports of general weakness. Pt reports she was d/c from the hospital this morning. Reports she was home for approx 40 minutes and her head began to feel heavy and she began to get weak.

## 2023-04-08 NOTE — Discharge Summary (Signed)
Physician Discharge Summary  Stephanie Atkinson:[id number] DOB: 05-06-1924 DOA: 04/06/2023  PCP: Linus Galas, NP  Admit date: 04/06/2023 Discharge date: 04/08/2023  Admitted From: Home Disposition: Home  Recommendations for Outpatient Follow-up:  Follow up with PCP in 1 week with repeat CBC/BMP Outpatient follow-up with cardiology Follow up in ED if symptoms worsen or new appear   Home Health: No Equipment/Devices: None  Discharge Condition: Stable CODE STATUS: DNR  diet recommendation: Heart healthy/fluid restriction of up to 1200 cc a day  Brief/Interim Summary: 89 year old female with history of chronic systolic heart failure, CKD stage IIIb, CAD status post CABG in 2006, essential hypertension, hyperlipidemia, acquired hypothyroidism, paroxysmal A-fib complicated by sick sinus syndrome status post pacemaker in 2019 chronically anticoagulated on Eliquis, chronic hypercalcemia and recent admission from 03/08/2023-03/11/2023 for acute metabolic encephalopathy from septic shock from UTI treated with IV antibiotics along with AKI and CHF presented with bilateral lower extremity swelling.  On presentation, BNP was 339, creatinine of 1.23 and high-sensitivity troponins were 15 and 14.  EKG showed no ST changes.  Chest x-ray was unremarkable.  She was started on IV Lasix.  Cardiology was consulted.  She has diuresed well and feels that her leg swelling has improved.  Cardiology recommended to resume her home oral regimen and has signed off.  Patient feels much better and feels okay to go home today.  Discharge patient home today with outpatient follow-up with PCP and cardiology.  Discharge Diagnoses:   Acute on chronic systolic heart failure Hypertension Hyperlipidemia -Echo in November 2024 had shown EF of 35 to 40%.  Presented with worsening lower extremity swelling. -Treated with IV Lasix along with oral Coreg and Entresto.  Cardiology was consulted.  She has diuresed well and feels that her  leg swelling has improved.  Cardiology recommended to resume her home oral regimen and has signed off.  Patient feels much better and feels okay to go home today.  Discharge patient home today with outpatient follow-up with PCP and cardiology. -Continue statin   Hyponatremia -Possibly from hypervolemia and fluid overload.  Sodium improving to 134 today as well.  Outpatient follow-up.  CKD stage IIIb -Creatinine currently stable.  Outpatient follow-up   Acquired hypothyroidism -continue levothyroxine   Paroxysmal A-fib History of sick sinus syndrome status post pacemaker in 2019 -Currently rate controlled.  Continue Eliquis -Pacemaker was interrogated during the hospitalization and reported normal function.   Chronic hypercalcemia -Has had chronic hypercalcemia.  Questionable cause.  Outpatient follow-up  CAD with history of bypass graft -Currently stable.  Continue Coreg and statin.  Outpatient follow-up with cardiology   Physical deconditioning Goals of care -PT recommended no PT follow-up.  Patient follows with [facility] outpatient palliative care   Discharge Instructions  Discharge Instructions     Ambulatory referral to Cardiology   Complete by: As directed    Hospital follow-up   Diet - low sodium heart healthy   Complete by: As directed    Increase activity slowly   Complete by: As directed       Allergies as of 04/08/2023       Reactions   Penicillins Hives   Sulfa Antibiotics Hives, Swelling   Gabapentin Other (See Comments)   Confusion   Lyrica [pregabalin] Other (See Comments)   Confusion        Medication List     TAKE these medications    acetaminophen 500 MG tablet Commonly known as: TYLENOL Take 500 mg by mouth See admin instructions. Take 500  mg in am, 1000 mg at 2 pm, 1000 mg 6 pm and 500 mg in pm by mouth.   carvedilol 3.125 MG tablet Commonly known as: COREG TAKE 1 TABLET BY MOUTH 2 TIMES DAILY. What changed: when to take this    Eliquis 2.5 MG Tabs tablet Generic drug: apixaban TAKE 1 TABLET BY MOUTH TWICE A DAY What changed: when to take this   Entresto 24-26 MG Generic drug: sacubitril-valsartan Take 1 tablet by mouth 2 (two) times daily.   furosemide 20 MG tablet Commonly known as: LASIX Take 1-2 tablets (20-40 mg total) by mouth See admin instructions. Take 20mg  by mouth on Tuesday, Thursday, Saturday, Sunday. Take 40 mg by mouth on Monday, Wednesday, Friday. Start taking on: April 09, 2023   HYDROcodone-acetaminophen 5-325 MG tablet Commonly known as: NORCO/VICODIN Take 1 tablet by mouth See admin instructions. Take 1 tablet by mouth twice a day. May take a third dose if needed for breakthrough pain.   levothyroxine 50 MCG tablet Commonly known as: SYNTHROID Take 50 mcg by mouth daily before breakfast.   lidocaine 5 % Commonly known as: LIDODERM Place 1 patch onto the skin daily as needed (Pain).   Lipitor 80 MG tablet Generic drug: atorvastatin Take 80 mg by mouth at bedtime.   spironolactone 25 MG tablet Commonly known as: ALDACTONE Take 1 tablet (25 mg total) by mouth daily.   SYSTANE OP Place 1 drop into both eyes daily as needed (dry eye).   VOLTAREN EX Apply 1 Dose topically daily as needed (Pain).        Follow-up Information     Linus Galas, NP. Schedule an appointment as soon as possible for a visit in 1 week(s).   Contact information: [address] [place] [address] [place] [address] [phone]                Allergies  Allergen Reactions   Penicillins Hives   Sulfa Antibiotics Hives and Swelling   Gabapentin Other (See Comments)    Confusion   Lyrica [Pregabalin] Other (See Comments)    Confusion    Consultations: Cardiology   Procedures/Studies: DG Chest Portable 1 View Result Date: 04/06/2023 CLINICAL DATA:  Dyspnea, bilateral lower extremity swelling EXAM: PORTABLE CHEST 1 VIEW COMPARISON:  03/08/2023 FINDINGS: Single frontal view of the  chest demonstrates postsurgical changes from CABG. Single lead pacer unchanged. Cardiac silhouette remains enlarged. No airspace disease, effusion, or pneumothorax. No acute bony abnormalities. IMPRESSION: 1. Stable enlarged cardiac silhouette. 2. No acute intrathoracic process. Electronically Signed   By: Sharlet Salina M.D.   On: 04/06/2023 18:04      Subjective: Patient seen and examined at bedside.  Feels that her leg swelling is much improved.  Complains of sciatica nerve pain.  Feels okay to go home today.  No fever, vomiting, worsening chest pain or shortness of breath reported.  Discharge Exam: Vitals:   04/08/23 0539 04/08/23 0750  BP: (!) 117/58 125/64  Pulse: 61 60  Resp: 14 17  Temp: 97.9 F (36.6 C) 98 F (36.7 C)  SpO2: 99% 100%    General: Pt is alert, awake, not in acute distress.  Elderly female sitting on chair.  On room air. Cardiovascular: rate controlled, S1/S2 + Respiratory: bilateral decreased breath sounds at bases with some scattered crackles Abdominal: Soft, NT, ND, bowel sounds + Extremities: Trace lower extremity edema, no cyanosis    The results of significant diagnostics from this hospitalization (including imaging, microbiology, ancillary and laboratory) are listed  below for reference.     Microbiology: Recent Results (from the past 240 hours)  MRSA Next Gen by PCR, Nasal     Status: None   Collection Time: 04/07/23  3:07 PM   Specimen: Nasal Mucosa; Nasal Swab  Result Value Ref Range Status   MRSA by PCR Next Gen NOT DETECTED NOT DETECTED Final    Comment: (NOTE) The GeneXpert MRSA Assay (FDA approved for NASAL specimens only), is one component of a comprehensive MRSA colonization surveillance program. It is not intended to diagnose MRSA infection nor to guide or monitor treatment for MRSA infections. Test performance is not FDA approved in patients less than 36 years old. Performed at [facility], [address], [place],  [place] [address]      Labs: BNP (last 3 results) Recent Labs    01/08/23 0527 01/16/23 1019 04/06/23 1744  BNP 273.7* 499.1* 339.2*   Basic Metabolic Panel: Recent Labs  Lab 04/06/23 1743 04/06/23 2043 04/07/23 0434 04/08/23 0222  NA 128*  --  134* 134*  K 4.2  --  3.8 3.5  CL 99  --  93* 98  CO2 22  --  22 27  GLUCOSE 91  --  94 107*  BUN 16  --  15 16  CREATININE 1.23*  --  1.00 1.32*  CALCIUM 10.3  --  12.0* 10.3  MG  --  1.9 1.8 1.7  PHOS  --   --  2.6  --    Liver Function Tests: Recent Labs  Lab 04/06/23 1743 04/07/23 0434  AST 26 27  ALT 22 21  ALKPHOS 61 63  BILITOT 0.8 1.3*  PROT 5.6* 5.8*  ALBUMIN 3.2* 3.5   No results for input(s): "LIPASE", "AMYLASE" in the last 168 hours. No results for input(s): "AMMONIA" in the last 168 hours. CBC: Recent Labs  Lab 04/06/23 1743 04/07/23 0434  WBC 4.1 4.2  NEUTROABS 2.3 2.7  HGB 10.9* 12.1  HCT 34.4* 36.5  MCV 91.5 89.0  PLT 174 199   Cardiac Enzymes: No results for input(s): "CKTOTAL", "CKMB", "CKMBINDEX", "TROPONINI" in the last 168 hours. BNP: Invalid input(s): "POCBNP" CBG: No results for input(s): "GLUCAP" in the last 168 hours. D-Dimer No results for input(s): "DDIMER" in the last 72 hours. Hgb A1c No results for input(s): "HGBA1C" in the last 72 hours. Lipid Profile No results for input(s): "CHOL", "HDL", "LDLCALC", "TRIG", "CHOLHDL", "LDLDIRECT" in the last 72 hours. Thyroid function studies Recent Labs    04/07/23 0434  TSH 1.211   Anemia work up Recent Labs    04/07/23 0434  VITAMINB12 227  FOLATE 12.0  FERRITIN 131  TIBC 300  IRON 109   Urinalysis    Component Value Date/Time   COLORURINE YELLOW 03/08/2023 1236   APPEARANCEUR CLEAR 03/08/2023 1236   LABSPEC 1.009 03/08/2023 1236   PHURINE 5.0 03/08/2023 1236   GLUCOSEU NEGATIVE 03/08/2023 1236   HGBUR NEGATIVE 03/08/2023 1236   BILIRUBINUR NEGATIVE 03/08/2023 1236   KETONESUR NEGATIVE 03/08/2023 1236   PROTEINUR  NEGATIVE 03/08/2023 1236   UROBILINOGEN 1.0 10/14/2012 0922   NITRITE NEGATIVE 03/08/2023 1236   LEUKOCYTESUR NEGATIVE 03/08/2023 1236   Sepsis Labs Recent Labs  Lab 04/06/23 1743 04/07/23 0434  WBC 4.1 4.2   Microbiology Recent Results (from the past 240 hours)  MRSA Next Gen by PCR, Nasal     Status: None   Collection Time: 04/07/23  3:07 PM   Specimen: Nasal Mucosa; Nasal Swab  Result  Value Ref Range Status   MRSA by PCR Next Gen NOT DETECTED NOT DETECTED Final    Comment: (NOTE) The GeneXpert MRSA Assay (FDA approved for NASAL specimens only), is one component of a comprehensive MRSA colonization surveillance program. It is not intended to diagnose MRSA infection nor to guide or monitor treatment for MRSA infections. Test performance is not FDA approved in patients less than 27 years old. Performed at [facility], [address], [place], [place] [address]      Time coordinating discharge: 35 minutes  SIGNED:   Glade Lloyd, MD  [facility] 04/08/2023, 10:51 AM

## 2023-04-08 NOTE — ED Provider Notes (Addendum)
[place] EMERGENCY DEPARTMENT AT [facility] Provider Note   CSN: [id number] Arrival date & time: 04/08/23  1713     History  Chief Complaint  Patient presents with   Weakness    Stephanie Atkinson is a 89 y.o. female.   Weakness    Pt was just discharged from the hospital today.  After being home for 10 minutes she complained of a headache and said she needed to lie down.  Daughter states she started to tremble. Her stomach was also hurting.  Daughter called 911.  She states her mom usually does not complain.  Right now she continues to have a slight headache.  She also has some pain in her abdomen.  She has not had any fevers or chills.  No vomiting.  Home Medications Prior to Admission medications   Medication Sig Start Date End Date Taking? Authorizing Provider  acetaminophen (TYLENOL) 500 MG tablet Take 500 mg by mouth See admin instructions. Take 500 mg in am, 1000 mg at 2 pm, 1000 mg 6 pm and 500 mg in pm by mouth.    [provider]  atorvastatin (LIPITOR) 80 MG tablet Take 80 mg by mouth at bedtime.     [provider]  carvedilol (COREG) 3.125 MG tablet TAKE 1 TABLET BY MOUTH 2 TIMES DAILY. Patient taking differently: Take 3.125 mg by mouth every 12 (twelve) hours. 03/13/23   Croitoru, Mihai, MD  Diclofenac Sodium (VOLTAREN EX) Apply 1 Dose topically daily as needed (Pain).    [provider]  ELIQUIS 2.5 MG TABS tablet TAKE 1 TABLET BY MOUTH TWICE A DAY Patient taking differently: Take 2.5 mg by mouth every 12 (twelve) hours. 03/13/23   Croitoru, Mihai, MD  furosemide (LASIX) 20 MG tablet Take 1-2 tablets (20-40 mg total) by mouth See admin instructions. Take 20mg  by mouth on Tuesday, Thursday, Saturday, Sunday. Take 40 mg by mouth on Monday, Wednesday, Friday. 04/09/23   Glade Lloyd, MD  HYDROcodone-acetaminophen (NORCO/VICODIN) 5-325 MG tablet Take 1 tablet by mouth See admin instructions. Take 1 tablet by mouth twice a day. May take a  third dose if needed for breakthrough pain. 03/03/23   [provider]  levothyroxine (SYNTHROID, LEVOTHROID) 50 MCG tablet Take 50 mcg by mouth daily before breakfast.     [provider]  lidocaine (LIDODERM) 5 % Place 1 patch onto the skin daily as needed (Pain). 02/25/23   [provider]  Polyethyl Glycol-Propyl Glycol (SYSTANE OP) Place 1 drop into both eyes daily as needed (dry eye).    [provider]  sacubitril-valsartan (ENTRESTO) 24-26 MG Take 1 tablet by mouth 2 (two) times daily. 02/06/23   Croitoru, Mihai, MD  spironolactone (ALDACTONE) 25 MG tablet Take 1 tablet (25 mg total) by mouth daily. 01/22/23   Runell Gess, MD      Allergies    Penicillins, Sulfa antibiotics, Gabapentin, and Lyrica [pregabalin]    Review of Systems   Review of Systems  Neurological:  Positive for weakness.    Physical Exam Updated Vital Signs BP (!) 121/59   Pulse 60   Temp 98.6 F (37 C)   Resp 15   SpO2 99%  Physical Exam Vitals and nursing note reviewed.  Constitutional:      Appearance: She is well-developed. She is not diaphoretic.     Comments: Elderly, frail  HENT:     Head: Normocephalic and atraumatic.     Right Ear: External ear normal.  Left Ear: External ear normal.  Eyes:     General: No scleral icterus.       Right eye: No discharge.        Left eye: No discharge.     Conjunctiva/sclera: Conjunctivae normal.  Neck:     Trachea: No tracheal deviation.  Cardiovascular:     Rate and Rhythm: Normal rate and regular rhythm.  Pulmonary:     Effort: Pulmonary effort is normal. No respiratory distress.     Breath sounds: Normal breath sounds. No stridor. No wheezing or rales.  Abdominal:     General: Bowel sounds are normal. There is no distension.     Palpations: Abdomen is soft.     Tenderness: There is abdominal tenderness. There is no guarding or rebound.  Musculoskeletal:        General: No tenderness or deformity.      Cervical back: Neck supple.     Right lower leg: No edema.     Left lower leg: No edema.  Skin:    General: Skin is warm and dry.     Findings: No rash.  Neurological:     General: No focal deficit present.     Mental Status: She is alert.     Cranial Nerves: No cranial nerve deficit, dysarthria or facial asymmetry.     Sensory: No sensory deficit.     Motor: No abnormal muscle tone or seizure activity.     Coordination: Coordination normal.     Comments: Difficulty lifting legs off the bed bilaterally but good plantarflexion strength, equal grip strength bilaterally, no facial droop noted, no dysarthria or aphasia  Psychiatric:        Mood and Affect: Mood normal.     ED Results / Procedures / Treatments   Labs (all labs ordered are listed, but only abnormal results are displayed) Labs Reviewed  BASIC METABOLIC PANEL - Abnormal; Notable for the following components:      Result Value   Sodium 133 (*)    Chloride 95 (*)    Glucose, Bld 115 (*)    Creatinine, Ser 1.58 (*)    Calcium 10.8 (*)    GFR, Estimated 30 (*)    All other components within normal limits  URINALYSIS, ROUTINE W REFLEX MICROSCOPIC - Abnormal; Notable for the following components:   Leukocytes,Ua MODERATE (*)    Bacteria, UA RARE (*)    All other components within normal limits  LIPASE, BLOOD - Abnormal; Notable for the following components:   Lipase 67 (*)    All other components within normal limits  TROPONIN I (HIGH SENSITIVITY) - Abnormal; Notable for the following components:   Troponin I (High Sensitivity) 18 (*)    All other components within normal limits  URINE CULTURE  CBC  HEPATIC FUNCTION PANEL  CBG MONITORING, ED  TROPONIN I (HIGH SENSITIVITY)    EKG EKG Interpretation Date/Time:  Tuesday April 08 2023 18:17:59 EST Ventricular Rate:  60 PR Interval:    QRS Duration:  159 QT Interval:  467 QTC Calculation: 467 R Axis:   -66  Text Interpretation: poor data quality, probable  nsr Left bundle branch block Confirmed by Linwood Dibbles [phone]) on 04/08/2023 6:25:57 PM  Radiology CT Head Wo Contrast Result Date: 04/08/2023 CLINICAL DATA:  Headache, new onset (Age >= 51y) EXAM: CT HEAD WITHOUT CONTRAST TECHNIQUE: Contiguous axial images were obtained from the base of the skull through the vertex without intravenous contrast. RADIATION DOSE REDUCTION: This exam was  performed according to the departmental dose-optimization program which includes automated exposure control, adjustment of the mA and/or kV according to patient size and/or use of iterative reconstruction technique. COMPARISON:  CT head 03/08/2023. FINDINGS: Motion limited study.  Within this limitation: Brain: No evidence of acute infarction, hemorrhage, hydrocephalus, extra-axial collection or mass lesion/mass effect. Remote right cerebellar infarct. Remote left thalamic lacunar infarct. Patchy white matter hypodensities are nonspecific but compatible with chronic microvascular ischemic disease. Vascular: No hyperdense vessel.  Calcific atherosclerosis. Skull: No acute fracture. Sinuses/Orbits: Clear sinuses.  No acute orbital findings. Other: No mastoid effusions. IMPRESSION: 1. No evidence of acute intracranial abnormality on motion limited assessment. Stable at CT. 2. Remote infarcts and chronic microvascular ischemic disease. Electronically Signed   By: Feliberto Harts M.D.   On: 04/08/2023 21:06   CT ABDOMEN PELVIS WO CONTRAST Result Date: 04/08/2023 CLINICAL DATA:  Weakness, abdominal pain, discharge from hospital earlier today EXAM: CT ABDOMEN AND PELVIS WITHOUT CONTRAST TECHNIQUE: Multidetector CT imaging of the abdomen and pelvis was performed following the standard protocol without IV contrast. RADIATION DOSE REDUCTION: This exam was performed according to the departmental dose-optimization program which includes automated exposure control, adjustment of the mA and/or kV according to patient size and/or use of  iterative reconstruction technique. COMPARISON:  03/08/2023 FINDINGS: Lower chest: The heart is enlarged without pericardial effusion. Stable pacemaker. No acute pleural or parenchymal lung disease. Hepatobiliary: Cholecystectomy. Unremarkable unenhanced appearance of the liver. Pancreas: Unremarkable unenhanced appearance. Spleen: Unremarkable unenhanced appearance. Adrenals/Urinary Tract: No urinary tract calculi or obstructive uropathy within either kidney. Stable bilateral renal cysts do not require specific imaging follow-up. The adrenals and bladder are unremarkable. Stomach/Bowel: No bowel obstruction or ileus. Normal appendix right lower quadrant. Diverticulosis of the descending and sigmoid colon without evidence of acute diverticulitis. No bowel wall thickening or inflammatory change. Vascular/Lymphatic: Aortic atherosclerosis. No enlarged abdominal or pelvic lymph nodes. Reproductive: Uterus and bilateral adnexa are unremarkable. Other: No free fluid or free intraperitoneal gas. No abdominal wall hernia. Musculoskeletal: Right hip arthroplasty. Diffuse osteopenia. No acute or destructive bony abnormalities. Reconstructed images demonstrate no additional findings. IMPRESSION: 1. No acute intra-abdominal or intrapelvic process. 2. Distal colonic diverticulosis without diverticulitis. 3. Cardiomegaly. 4.  Aortic Atherosclerosis (ICD10-I70.0). Electronically Signed   By: Sharlet Salina M.D.   On: 04/08/2023 21:06    Procedures Procedures    Medications Ordered in ED Medications  fosfomycin (MONUROL) packet 3 g (has no administration in time range)  HYDROcodone-acetaminophen (NORCO/VICODIN) 5-325 MG per tablet 1 tablet (1 tablet Oral Given 04/08/23 2232)    ED Course/ Medical Decision Making/ A&P Clinical Course as of 04/08/23 2308  Tue Apr 08, 2023  1943 CBC CBC normal.  Metabolic panel shows increased creatinine.  Likely related to recent iv diuresis in the hospital [JK]  2056 Lipase,  blood(!) Lipase slightly increased [JK]  2056 Troponin I (High Sensitivity)(!) Troponin slightly increased at 18.  Will repeat [JK]  2202 Urinalysis, Routine w reflex microscopic -Urine, Clean Catch(!) Urinalysis shows moderate leukocyte esterase 11-20 whites rare bacteria [JK]  2202 Troponin I (High Sensitivity) Second troponin normal [JK]  2202 Head CT without acute abnormality [JK]  2202 Abdomen pelvis CT without acute abnormality [JK]  2307 Case discussed with Dr Margo Aye regarding admission [JK]    Clinical Course User Index [JK] Linwood Dibbles, MD  Medical Decision Making Problems Addressed: AKI (acute kidney injury) [facility]): acute illness or injury that poses a threat to life or bodily functions Urinary tract infection without hematuria, site unspecified: acute illness or injury that poses a threat to life or bodily functions Weakness: acute illness or injury that poses a threat to life or bodily functions  Amount and/or Complexity of Data Reviewed Labs: ordered. Decision-making details documented in ED Course. Radiology: ordered and independent interpretation performed.  Risk Prescription drug management.   Patient presented to the ED for an episode of weakness.  Patient recently just was discharged from the hospital after being treated for CHF exacerbation.  Patient also was complaining of some headache as well as abdominal discomfort.  No focal neurologic deficits noted on exam.  Laboratory test did not show any signs of hepatitis.  Slight elevation in lipase but doubt pancreatitis.  Metabolic panel does show slight increase in her creatinine likely related to recent diuresis.  Urinalysis does show moderate leukocyte esterase and 1120 white blood cells rare bacteria.  Patient denies any urinary discomfort right now but she did have some abdominal pain earlier.  It is possible she might have a UTI.  Will start her on a course of antibiotics.  Discussed  possible discharge however the daughter is very concerned.  Patient does have advanced age multiple comorbidities.  She was only home for a very short period of time.  I will consult with the medical service for observation.  Considering her increased creatinine possible UTI     Final Clinical Impression(s) / ED Diagnoses Final diagnoses:  Weakness  AKI (acute kidney injury) (HCC)  Urinary tract infection without hematuria, site unspecified    Rx / DC Orders ED Discharge Orders     None         Linwood Dibbles, MD 04/08/23 2232    Linwood Dibbles, MD 04/08/23 2308

## 2023-04-08 NOTE — TOC Transition Note (Signed)
Transition of Care [facility]) - Discharge Note   Patient Details  Name: Stephanie Atkinson MRN: [id number] Date of Birth: [birth year]  Transition of Care [facility] CM/SW Contact:  Lawerance Sabal, RN Phone Number: 04/08/2023, 12:21 PM   Clinical Narrative:     Sherron Monday w patient's daughter, [name] to discuss home needs. She states that patient was recently active with [facility], is currently active w [facility] for palliative and has needed DME at home. Spoke w Shawn at [facility] who offerred HH services through them. He states that RN PT OT available. MD placed HH orders.  Daughter agreeable that patient and pain control would be best served through [facility] as they have familiarity with patient . She discussed her concerns about her mother living alone and I will email her resources for how to begin ALF process and additional personal care options for home.  Daughter will provide transportation home       Rashid,Judy (Daughter) [phone]    Final next level of care: Home w Home Health Services Barriers to Discharge: No Barriers Identified   Patient Goals and CMS Choice Patient states their goals for this hospitalization and ongoing recovery are:: to go home CMS Medicare.gov Compare Post Acute Care list provided to:: Other (Comment Required) Choice offered to / list presented to : Adult Children      Discharge Placement                       Discharge Plan and Services Additional resources added to the After Visit Summary for                  DME Arranged: N/A         HH Arranged: RN, PT, OT [facility] Agency: [facility] Date [facility] Agency Contacted: 04/08/23 Time HH Agency Contacted: 1221 Representative spoke with at [facility] Agency: Shawn  Social Drivers of Health (SDOH) Interventions SDOH Screenings   Food Insecurity: No Food Insecurity (04/07/2023)  Housing: Low Risk  (04/07/2023)  Transportation Needs: No Transportation Needs (04/07/2023)  Utilities: Not At Risk  (04/07/2023)  Financial Resource Strain: Low Risk  (03/04/2023)  Social Connections: Socially Isolated (04/07/2023)  Tobacco Use: High Risk (04/06/2023)     Readmission Risk Interventions    03/11/2023   10:35 AM  Readmission Risk Prevention Plan  Transportation Screening Complete  PCP or Specialist Appt within 5-7 Days Complete  Home Care Screening Complete  Medication Review (RN CM) Complete

## 2023-04-08 NOTE — Discharge Instructions (Addendum)
 Advised to take losartan and metoprolol as per cardiology.

## 2023-04-08 NOTE — Evaluation (Signed)
Physical Therapy Evaluation/ discharge Patient Details Name: Stephanie Atkinson MRN: [id number] DOB: Jan 15, 1925 Today's Date: 04/08/2023  History of Present Illness  89 y.o. female admitted 2/9 with edema, SOB, CHF exacerbation. PMHx:  HTN, HLD, CAD s/p CABG, permanent AFib, hypothyroidism, CKD, MI, syncope s/p PPM, sCHF, ICM, Rt THA  Clinical Impression  Pt pleasant, standing in room on arrival. Pt able to change underwear without assist, walk in hall and perform transfers without cues or assist. Pt steady with use of RW and uses rollator at all times at home. Pt reports assist for groceries and laundry at home but otherwise cares for herself. Pt would benefit from aide to assist with care as daughter lives out of town. Pt at baseline functional level and encouraged to walk with staff/mobility acutely. No further therapy needs with pt aware and agreeable.         If plan is discharge home, recommend the following: Assist for transportation;Assistance with cooking/housework   Can travel by private vehicle        Equipment Recommendations Other (comment) (home on person safety alert system (has pull cords currently in apartment))  Recommendations for Other Services       Functional Status Assessment Patient has not had a recent decline in their functional status     Precautions / Restrictions Precautions Precautions: Fall      Mobility  Bed Mobility               General bed mobility comments: standing EOB on arrival    Transfers Overall transfer level: Modified independent                 General transfer comment: pt able to stand from EOB and sit to recliner without assist    Ambulation/Gait Ambulation/Gait assistance: Supervision Gait Distance (Feet): 250 Feet Assistive device: Rolling walker (2 wheels) Gait Pattern/deviations: Step-through pattern, Decreased stride length, Trunk flexed   Gait velocity interpretation: 1.31 - 2.62 ft/sec, indicative of limited  community ambulator   General Gait Details: pt with steady gait with use of RW, supervision for direction  Stairs            Wheelchair Mobility     Tilt Bed    Modified Rankin (Stroke Patients Only)       Balance Overall balance assessment: Mild deficits observed, not formally tested                                           Pertinent Vitals/Pain Pain Assessment Pain Assessment: 0-10 Pain Score: 8  Pain Location: bil hips Pain Descriptors / Indicators: Aching Pain Intervention(s): Limited activity within patient's tolerance, Monitored during session, Premedicated before session, Repositioned    Home Living Family/patient expects to be discharged to:: Private residence Living Arrangements: Alone Available Help at Discharge: Family;Available PRN/intermittently Type of Home: Apartment Home Access: Elevator       Home Layout: One level Home Equipment: Grab bars - tub/shower;Grab bars - toilet;Shower seat;Cane - single point;Rollator (4 wheels);Cane - Programmer, applications (2 wheels);Adaptive equipment      Prior Function Prior Level of Function : Needs assist             Mobility Comments: rollator for mobility ADLs Comments: daughter bring groceries, no longer drives, assist for washing clothes     Extremity/Trunk Assessment   Upper Extremity Assessment Upper Extremity Assessment: Generalized weakness  Lower Extremity Assessment Lower Extremity Assessment: Generalized weakness    Cervical / Trunk Assessment Cervical / Trunk Assessment: Kyphotic  Communication   Communication Communication: No apparent difficulties    Cognition Arousal: Alert Behavior During Therapy: WFL for tasks assessed/performed   PT - Cognitive impairments: No apparent impairments                         Following commands: Intact       Cueing Cueing Techniques: Verbal cues     General Comments      Exercises     Assessment/Plan     PT Assessment Patient does not need any further PT services  PT Problem List         PT Treatment Interventions      PT Goals (Current goals can be found in the Care Plan section)  Acute Rehab PT Goals PT Goal Formulation: All assessment and education complete, DC therapy    Frequency       Co-evaluation               AM-PAC PT "6 Clicks" Mobility  Outcome Measure Help needed turning from your back to your side while in a flat bed without using bedrails?: None Help needed moving from lying on your back to sitting on the side of a flat bed without using bedrails?: None Help needed moving to and from a bed to a chair (including a wheelchair)?: None Help needed standing up from a chair using your arms (e.g., wheelchair or bedside chair)?: None Help needed to walk in hospital room?: A Little Help needed climbing 3-5 steps with a railing? : A Little 6 Click Score: 22    End of Session   Activity Tolerance: Patient tolerated treatment well Patient left: in chair;with call bell/phone within reach Nurse Communication: Mobility status PT Visit Diagnosis: Other abnormalities of gait and mobility (R26.89)    Time: 4098-1191 PT Time Calculation (min) (ACUTE ONLY): 28 min   Charges:   PT Evaluation $PT Eval Moderate Complexity: 1 Mod   PT General Charges $$ ACUTE PT VISIT: 1 Visit         Stephanie Atkinson, PT Acute Rehabilitation Services Office: [phone]   Stephanie Atkinson 04/08/2023, 9:52 AM

## 2023-04-08 NOTE — Discharge Instructions (Signed)

## 2023-04-09 ENCOUNTER — Encounter (HOSPITAL_COMMUNITY): Payer: Self-pay | Admitting: Internal Medicine

## 2023-04-09 ENCOUNTER — Other Ambulatory Visit: Payer: Self-pay

## 2023-04-09 DIAGNOSIS — I480 Paroxysmal atrial fibrillation: Secondary | ICD-10-CM

## 2023-04-09 DIAGNOSIS — N1832 Chronic kidney disease, stage 3b: Secondary | ICD-10-CM

## 2023-04-09 DIAGNOSIS — E785 Hyperlipidemia, unspecified: Secondary | ICD-10-CM

## 2023-04-09 DIAGNOSIS — R55 Syncope and collapse: Secondary | ICD-10-CM

## 2023-04-09 DIAGNOSIS — E039 Hypothyroidism, unspecified: Secondary | ICD-10-CM

## 2023-04-09 DIAGNOSIS — R531 Weakness: Secondary | ICD-10-CM

## 2023-04-09 DIAGNOSIS — I10 Essential (primary) hypertension: Secondary | ICD-10-CM

## 2023-04-09 DIAGNOSIS — N3000 Acute cystitis without hematuria: Secondary | ICD-10-CM

## 2023-04-09 DIAGNOSIS — E782 Mixed hyperlipidemia: Secondary | ICD-10-CM

## 2023-04-09 DIAGNOSIS — I25810 Atherosclerosis of coronary artery bypass graft(s) without angina pectoris: Secondary | ICD-10-CM

## 2023-04-09 DIAGNOSIS — I5032 Chronic diastolic (congestive) heart failure: Secondary | ICD-10-CM

## 2023-04-09 LAB — CBC
HCT: 37.3 % (ref 36.0–46.0)
Hemoglobin: 12.2 g/dL (ref 12.0–15.0)
MCH: 29.3 pg (ref 26.0–34.0)
MCHC: 32.7 g/dL (ref 30.0–36.0)
MCV: 89.7 fL (ref 80.0–100.0)
Platelets: 257 10*3/uL (ref 150–400)
RBC: 4.16 MIL/uL (ref 3.87–5.11)
RDW: 14.2 % (ref 11.5–15.5)
WBC: 5.4 10*3/uL (ref 4.0–10.5)
nRBC: 0 % (ref 0.0–0.2)

## 2023-04-09 LAB — BASIC METABOLIC PANEL
Anion gap: 10 (ref 5–15)
BUN: 17 mg/dL (ref 8–23)
CO2: 23 mmol/L (ref 22–32)
Calcium: 10.5 mg/dL — ABNORMAL HIGH (ref 8.9–10.3)
Chloride: 101 mmol/L (ref 98–111)
Creatinine, Ser: 1.39 mg/dL — ABNORMAL HIGH (ref 0.44–1.00)
GFR, Estimated: 35 mL/min — ABNORMAL LOW (ref >60)
Glucose, Bld: 96 mg/dL (ref 70–99)
Potassium: 3.7 mmol/L (ref 3.5–5.1)
Sodium: 134 mmol/L — ABNORMAL LOW (ref 135–145)

## 2023-04-09 LAB — URINALYSIS, W/ REFLEX TO CULTURE (INFECTION SUSPECTED)
Bacteria, UA: NONE SEEN
Bilirubin Urine: NEGATIVE
Glucose, UA: NEGATIVE mg/dL
Hgb urine dipstick: NEGATIVE
Ketones, ur: NEGATIVE mg/dL
Nitrite: NEGATIVE
Protein, ur: NEGATIVE mg/dL
Specific Gravity, Urine: 1.008 (ref 1.005–1.030)
pH: 6 (ref 5.0–8.0)

## 2023-04-09 LAB — PHOSPHORUS: Phosphorus: 2.4 mg/dL — ABNORMAL LOW (ref 2.5–4.6)

## 2023-04-09 LAB — MAGNESIUM: Magnesium: 1.8 mg/dL (ref 1.7–2.4)

## 2023-04-09 MED ORDER — ACETAMINOPHEN 500 MG PO TABS
500.0000 mg | ORAL_TABLET | Freq: Three times a day (TID) | ORAL | Status: DC
  Administered 2023-04-09 – 2023-04-12 (×11): 500 mg via ORAL
  Filled 2023-04-09 (×13): qty 1

## 2023-04-09 MED ORDER — POLYETHYL GLYCOL-PROPYL GLYCOL 0.4-0.3 % OP GEL: Freq: Every day | OPHTHALMIC | Status: DC | PRN

## 2023-04-09 MED ORDER — ATORVASTATIN CALCIUM 80 MG PO TABS
80.0000 mg | ORAL_TABLET | Freq: Every day | ORAL | Status: DC
  Administered 2023-04-09 – 2023-04-23 (×15): 80 mg via ORAL
  Filled 2023-04-09 (×15): qty 1

## 2023-04-09 MED ORDER — K PHOS MONO-SOD PHOS DI & MONO 155-852-130 MG PO TABS
250.0000 mg | ORAL_TABLET | Freq: Two times a day (BID) | ORAL | Status: AC
  Administered 2023-04-09 – 2023-04-11 (×6): 250 mg via ORAL
  Filled 2023-04-09 (×7): qty 1

## 2023-04-09 MED ORDER — ACETAMINOPHEN 500 MG PO TABS
500.0000 mg | ORAL_TABLET | Freq: Once | ORAL | Status: AC
  Administered 2023-04-09: 500 mg via ORAL

## 2023-04-09 MED ORDER — ORAL CARE MOUTH RINSE: 15.0000 mL | OROMUCOSAL | Status: DC | PRN

## 2023-04-09 MED ORDER — SODIUM CHLORIDE 0.9 % IV SOLN: 2.0000 g | INTRAVENOUS | Status: DC

## 2023-04-09 NOTE — Progress Notes (Signed)
PROGRESS NOTE    Stephanie Atkinson  VHQ:[id number] DOB: October 01, 1924 DOA: 04/08/2023 PCP: Linus Galas, NP    Chief Complaint  Patient presents with   Weakness    Brief Narrative:  Patient 89 year old female history of chronic HF R EF, CKD 3B, CAD status post CABG in 2006, hypertension, hyperlipidemia, hypothyroidism, paroxysmal A-fib on Eliquis recently admitted from 04/06/2023-04/08/2023 for acute on chronic HFrEF presenting on day of discharge with complaints of generalized weakness 45 minutes after she arrived at home.  Daughter describes episode which seems more presyncope nature.  Patient subsequently brought to the ED.  On presentation to the ED patient noted to have soft blood pressures, afebrile, no leukocytosis, urinalysis consistent with a UTI.  Patient placed on antibiotics.  Antihypertensive medications held.     Assessment & Plan:   Principal Problem:   Generalized weakness Active Problems:   Near syncope   Symptomatic bradycardia s/p PPM    CKD stage 3b, GFR 30-44 ml/min (HCC)   Acquired hypothyroidism   Essential hypertension   Dyslipidemia   CAD (coronary artery disease) of artery bypass graft   Dehydration   Paroxysmal atrial fibrillation (HCC)   HLD (hyperlipidemia)   Chronic diastolic heart failure (HCC)   UTI (urinary tract infection)  #1 generalized weakness/near syncope -For likely multifactorial secondary to overdiuresis during recent hospitalization in the setting of presumptive UTI, POA. -Patient noted to have soft blood pressure on admission. -Patient's home diuretics and antihypertensive medications currently on hold. -Patient with clinical improvement however systolic blood pressure is soft. -Urine cultures pending. -Status post fosfomycin. -PT/OT. -Fall precautions.  2.  Presumptive UTI, POA -Urine cultures pending. -Status post fosfomycin. -Supportive care.  3.  Chronic HFrEF -Euvolemic on examination. -Continue to hold home regimen of diuretics  due to soft blood pressure. -Continue to hold Coreg, Entresto, Lasix. -Once BP improves may need to resume home regimen diuretics on half the dose.  4.  Paroxysmal A-fib on Eliquis -Rate controlled. -Continue Eliquis for anticoagulation.  5.  Hypothyroidism -Continue home regimen Synthroid.  6.  CKD 3B -Stable. -Close to baseline.  7.  CAD status post CABG -Continue home regimen Lipitor and Eliquis. -Continue to hold Coreg/soft blood pressure, continue to hold diuretics and Entresto and spironolactone.    DVT prophylaxis: Eliquis Code Status: DNR Family Communication: Updated daughter at bedside. Disposition: TBD  Status is: Observation The patient remains OBS appropriate and will d/c before 2 midnights.   Consultants:  None  Procedures:  CT head 04/08/2023 CT abdomen pelvis 04/08/2023  Antimicrobials:  Anti-infectives (From admission, onward)    Start     Dose/Rate Route Frequency Ordered Stop   04/09/23 0915  cefTRIAXone (ROCEPHIN) 2 g in sodium chloride 0.9 % 100 mL IVPB  Status:  Discontinued        2 g 200 mL/hr over 30 Minutes Intravenous Every 24 hours 04/09/23 0909 04/09/23 0933   04/08/23 2245  fosfomycin (MONUROL) packet 3 g        3 g Oral  Once 04/08/23 2232 04/09/23 0035         Subjective: Sitting up in chair. Feels well. No CP. No SOB. Daughter at bedside and describes more a pre syncope episode prompting return to ED. Daugetr concerned about support on dc.   Objective: Vitals:   04/09/23 1106 04/09/23 1200 04/09/23 1343 04/09/23 1529  BP: 128/66 (!) 135/54 120/66 (!) 117/48  Pulse: 61 60 64 61  Resp: 15 20 18    Temp: 97.9 F (36.6  C)  98.1 F (36.7 C) 98.1 F (36.7 C)  TempSrc:    Oral  SpO2: 100% 100% 100% 98%   No intake or output data in the 24 hours ending 04/09/23 1614 There were no vitals filed for this visit.  Examination:  General exam: Appears calm and comfortable  Respiratory system: Clear to auscultation. Respiratory  effort normal. Cardiovascular system: S1 & S2 heard, RRR. No JVD, murmurs, rubs, gallops or clicks. No pedal edema. Gastrointestinal system: Abdomen is nondistended, soft and nontender. No organomegaly or masses felt. Normal bowel sounds heard. Central nervous system: Alert and oriented. No focal neurological deficits. Extremities: Symmetric 5 x 5 power. Skin: No rashes, lesions or ulcers Psychiatry: Judgement and insight appear normal. Mood & affect appropriate.     Data Reviewed: I have personally reviewed following labs and imaging studies  CBC: Recent Labs  Lab 04/06/23 1743 04/07/23 0434 04/08/23 1801 04/09/23 0525  WBC 4.1 4.2 6.4 5.4  NEUTROABS 2.3 2.7  --   --   HGB 10.9* 12.1 12.6 12.2  HCT 34.4* 36.5 38.0 37.3  MCV 91.5 89.0 93.4 89.7  PLT 174 199 205 257    Basic Metabolic Panel: Recent Labs  Lab 04/06/23 1743 04/06/23 2043 04/07/23 0434 04/08/23 0222 04/08/23 1801 04/09/23 0630  NA 128*  --  134* 134* 133* 134*  K 4.2  --  3.8 3.5 4.2 3.7  CL 99  --  93* 98 95* 101  CO2 22  --  22 27 25 23   GLUCOSE 91  --  94 107* 115* 96  BUN 16  --  15 16 18 17   CREATININE 1.23*  --  1.00 1.32* 1.58* 1.39*  CALCIUM 10.3  --  12.0* 10.3 10.8* 10.5*  MG  --  1.9 1.8 1.7  --  1.8  PHOS  --   --  2.6  --   --  2.4*    GFR: Estimated Creatinine Clearance: 18.7 mL/min (A) (by C-G formula based on SCr of 1.39 mg/dL (H)).  Liver Function Tests: Recent Labs  Lab 04/06/23 1743 04/07/23 0434 04/08/23 1918  AST 26 27 28   ALT 22 21 25   ALKPHOS 61 63 66  BILITOT 0.8 1.3* 0.9  PROT 5.6* 5.8* 6.5  ALBUMIN 3.2* 3.5 3.7    CBG: No results for input(s): "GLUCAP" in the last 168 hours.   Recent Results (from the past 240 hours)  MRSA Next Gen by PCR, Nasal     Status: None   Collection Time: 04/07/23  3:07 PM   Specimen: Nasal Mucosa; Nasal Swab  Result Value Ref Range Status   MRSA by PCR Next Gen NOT DETECTED NOT DETECTED Final    Comment: (NOTE) The GeneXpert  MRSA Assay (FDA approved for NASAL specimens only), is one component of a comprehensive MRSA colonization surveillance program. It is not intended to diagnose MRSA infection nor to guide or monitor treatment for MRSA infections. Test performance is not FDA approved in patients less than 69 years old. Performed at [facility], [address], [place], [place] [address]          Radiology Studies: CT Head Wo Contrast Result Date: 04/08/2023 CLINICAL DATA:  Headache, new onset (Age >= 51y) EXAM: CT HEAD WITHOUT CONTRAST TECHNIQUE: Contiguous axial images were obtained from the base of the skull through the vertex without intravenous contrast. RADIATION DOSE REDUCTION: This exam was performed according to the departmental dose-optimization program which includes automated exposure control, adjustment of  the mA and/or kV according to patient size and/or use of iterative reconstruction technique. COMPARISON:  CT head 03/08/2023. FINDINGS: Motion limited study.  Within this limitation: Brain: No evidence of acute infarction, hemorrhage, hydrocephalus, extra-axial collection or mass lesion/mass effect. Remote right cerebellar infarct. Remote left thalamic lacunar infarct. Patchy white matter hypodensities are nonspecific but compatible with chronic microvascular ischemic disease. Vascular: No hyperdense vessel.  Calcific atherosclerosis. Skull: No acute fracture. Sinuses/Orbits: Clear sinuses.  No acute orbital findings. Other: No mastoid effusions. IMPRESSION: 1. No evidence of acute intracranial abnormality on motion limited assessment. Stable at CT. 2. Remote infarcts and chronic microvascular ischemic disease. Electronically Signed   By: Feliberto Harts M.D.   On: 04/08/2023 21:06   CT ABDOMEN PELVIS WO CONTRAST Result Date: 04/08/2023 CLINICAL DATA:  Weakness, abdominal pain, discharge from hospital earlier today EXAM: CT ABDOMEN AND PELVIS WITHOUT CONTRAST TECHNIQUE: Multidetector CT  imaging of the abdomen and pelvis was performed following the standard protocol without IV contrast. RADIATION DOSE REDUCTION: This exam was performed according to the departmental dose-optimization program which includes automated exposure control, adjustment of the mA and/or kV according to patient size and/or use of iterative reconstruction technique. COMPARISON:  03/08/2023 FINDINGS: Lower chest: The heart is enlarged without pericardial effusion. Stable pacemaker. No acute pleural or parenchymal lung disease. Hepatobiliary: Cholecystectomy. Unremarkable unenhanced appearance of the liver. Pancreas: Unremarkable unenhanced appearance. Spleen: Unremarkable unenhanced appearance. Adrenals/Urinary Tract: No urinary tract calculi or obstructive uropathy within either kidney. Stable bilateral renal cysts do not require specific imaging follow-up. The adrenals and bladder are unremarkable. Stomach/Bowel: No bowel obstruction or ileus. Normal appendix right lower quadrant. Diverticulosis of the descending and sigmoid colon without evidence of acute diverticulitis. No bowel wall thickening or inflammatory change. Vascular/Lymphatic: Aortic atherosclerosis. No enlarged abdominal or pelvic lymph nodes. Reproductive: Uterus and bilateral adnexa are unremarkable. Other: No free fluid or free intraperitoneal gas. No abdominal wall hernia. Musculoskeletal: Right hip arthroplasty. Diffuse osteopenia. No acute or destructive bony abnormalities. Reconstructed images demonstrate no additional findings. IMPRESSION: 1. No acute intra-abdominal or intrapelvic process. 2. Distal colonic diverticulosis without diverticulitis. 3. Cardiomegaly. 4.  Aortic Atherosclerosis (ICD10-I70.0). Electronically Signed   By: Sharlet Salina M.D.   On: 04/08/2023 21:06        Scheduled Meds:  acetaminophen  500 mg Oral TID   acetaminophen  500 mg Oral Once   apixaban  2.5 mg Oral BID   atorvastatin  80 mg Oral QHS   levothyroxine  50 mcg  Oral QAC breakfast   phosphorus  250 mg Oral BID   Continuous Infusions:   LOS: 0 days    Time spent: 40 minutes    Ramiro Harvest, MD [facility]   To contact the attending provider between 7A-7P or the covering provider during after hours 7P-7A, please log into the web site [email or web address] and access using universal [place] password for that web site. If you do not have the password, please call the hospital operator.  04/09/2023, 4:14 PM

## 2023-04-09 NOTE — TOC Initial Note (Signed)
Transition of Care [facility]) - Initial/Assessment Note    Patient Details  Name: Stephanie Atkinson MRN: [id number] Date of Birth: 10-09-1924  [facility] CM/SW Contact:    Kermit Balo, RN Phone Number: 04/09/2023, 4:09 PM  Clinical Narrative:                  Pt is from home alone. Per daughter she has been struggling with ADL's at home. Daughter states she has been having to come from [place] frequently to assist. CM inquired about pt staying with her in [place] and daughter states that pt can not.  Therapies have recommended HH. Pt is active with [facility] for palliative care and HH therapies. Daughter is asking for rehab. CM has asked therapies to re-eval tomorrow.  TOC following.  Expected Discharge Plan: Home w Home Health Services Barriers to Discharge: Continued Medical Work up   Patient Goals and CMS Choice   CMS Medicare.gov Compare Post Acute Care list provided to:: Patient Represenative (must comment) Choice offered to / list presented to : Adult Children      Expected Discharge Plan and Services   Discharge Planning Services: CM Consult   Living arrangements for the past 2 months: Apartment                                      Prior Living Arrangements/Services Living arrangements for the past 2 months: Apartment Lives with:: Self Patient language and need for interpreter reviewed:: Yes Do you feel safe going back to the place where you live?: Yes        Care giver support system in place?: No (comment)   Criminal Activity/Legal Involvement Pertinent to Current Situation/Hospitalization: No - Comment as needed  Activities of Daily Living      Permission Sought/Granted                  Emotional Assessment Appearance:: Appears stated age Attitude/Demeanor/Rapport: Engaged Affect (typically observed): Accepting Orientation: : Oriented to Self, Oriented to Place, Oriented to  Time, Oriented to Situation   Psych Involvement:  No (comment)  Admission diagnosis:  Weakness [R53.1] Generalized weakness [R53.1] AKI (acute kidney injury) (HCC) [N17.9] Urinary tract infection without hematuria, site unspecified [N39.0] Patient Active Problem List   Diagnosis Date Noted   Generalized weakness 04/08/2023   Acute hyponatremia 04/07/2023   Acute anemia 04/07/2023   Acute on chronic systolic (congestive) heart failure (HCC) 04/06/2023   AMS (altered mental status) 03/08/2023   Hypotension 03/08/2023   Sepsis (HCC) 03/08/2023   UTI (urinary tract infection) 03/08/2023   CKD stage 3b, GFR 30-44 ml/min (HCC) 01/09/2023   Near syncope 01/08/2023   Elevated troponin 01/08/2023   Hypercalcemia 01/08/2023   Acquired hypothyroidism 01/08/2023   Low back pain 06/14/2022   CHF (congestive heart failure) (HCC) 12/11/2021   Acute on chronic systolic CHF (congestive heart failure) (HCC) 12/09/2021   Venous stasis dermatitis of both lower extremities 01/06/2020   Unilateral primary osteoarthritis, right knee 01/06/2020   Community acquired bacterial pneumonia 06/23/2019   AKI (acute kidney injury) (HCC) 06/23/2019   Synovitis of right knee 03/02/2019   Chronic diastolic heart failure (HCC) 09/14/2018   Pacemaker 07/16/2017   Paroxysmal atrial fibrillation (HCC) 06/27/2017   HLD (hyperlipidemia) 06/27/2017   Symptomatic bradycardia s/p PPM  06/27/2017   Chronic pain of right knee 05/17/2016   De Quervain's tenosynovitis 05/17/2016  CAD (coronary artery disease) of artery bypass graft 02/13/2015   Encounter for loop recorder check 02/01/2014   Accelerated junctional rhythm 05/04/2013   Dehydration 10/14/2012   Syncope and collapse 10/13/2012   History of first degree atrioventricular block 10/11/2012   Long term (current) use of anticoagulants 10/06/2012   AVB - beta blocker and Amiodarone stopped 07/10/2012   Essential hypertension    Hx of CABG X 4 3/06-     Dyslipidemia    RCA DES placed 7/12- patent 8/14     Permanent atrial fibrillation (HCC)    Acute kidney injury superimposed on chronic kidney disease (HCC)    PCP:  Linus Galas, NP Pharmacy:   [facility] [address], [place] - [address] [place] [address] Phone: [phone] Fax: [phone]  [facility] [address] [place] [address] Phone: [phone] Fax: [phone]     Social Drivers of Health (SDOH) Social History: SDOH Screenings   Food Insecurity: No Food Insecurity (04/07/2023)  Housing: Low Risk  (04/07/2023)  Transportation Needs: No Transportation Needs (04/07/2023)  Utilities: Not At Risk (04/07/2023)  Financial Resource Strain: Low Risk  (03/04/2023)  Social Connections: Socially Isolated (04/07/2023)  Tobacco Use: High Risk (04/06/2023)   SDOH Interventions:     Readmission Risk Interventions    03/11/2023   10:35 AM  Readmission Risk Prevention Plan  Transportation Screening Complete  PCP or Specialist Appt within 5-7 Days Complete  Home Care Screening Complete  Medication Review (RN CM) Complete

## 2023-04-09 NOTE — ED Notes (Signed)
Pt daughter was updated on pt room number upstairs, advised she would be back later.

## 2023-04-09 NOTE — Progress Notes (Signed)
Civil engineer, contracting Liaison Note   Ms. Stephanie Atkinson is currently being followed by [facility] Outpatient Palliative [facility]. [facility] will also be providing SNHH/PT/OT services upon discharge. Please reorder these services if appropriate at discharge.    Please do not hesitate to call with any questions or concerns.   Glenna Fellows, BSN, RN, [facility] [phone] or [phone]

## 2023-04-09 NOTE — Care Management Obs Status (Signed)
MEDICARE OBSERVATION STATUS NOTIFICATION   Patient Details  Name: Stephanie Atkinson MRN: [id number] Date of Birth: June 06, 1924   Medicare Observation Status Notification Given:  Yes    Kermit Balo, RN 04/09/2023, 4:05 PM

## 2023-04-09 NOTE — Discharge Planning (Signed)
RNCM consulted regarding home health services for pt.  RNCM contacted by [facility] Liaison for [facility]  who confirms that "[facility] will also be providing SNHH/PT/OT services upon discharge."  Stuti Sandin J. Lucretia Roers, RN, BSN, NCM  Transitions of Care  Nurse Case Manager  [facility] Emergency Departments  Operative Services  [phone]

## 2023-04-09 NOTE — Evaluation (Signed)
Occupational Therapy Evaluation and Discharge Patient Details Name: Stephanie Atkinson MRN: [id number] DOB: 1924/09/19 Today's Date: 04/09/2023   History of Present Illness   History of Present Illness: Pt is a 89 y.o. female recently admitted from 04/06/2023 until 04/08/2023 for acute on chronic HFrEF, who presents on the day of discharge with complaints of generalized weakness 45 minutes after she arrived at her home. Possible overdiuresis, presumptive UTI, euvolemic on exam. PMH: HTN, HLD, CAD s/p CABG, paroxysmal atrial fibrillation, hypothyroidism, CKD stage 3B, MI, syncope, chronic HFrEF, R THA     Clinical Impressions This 89 yo female re-admitted with above presents to acute OT with PLOF before last hospital admission of being Mod I/independent with all basic ADLs from a rollator level and some A with IADLs. Currently she appears back to her baseline but since she came back the same day of discharge feel a HHOT eval and treat would be good. No further OT needs, we will sign off.     If plan is discharge home, recommend the following:   Assistance with cooking/housework     Functional Status Assessment   Patient has had a recent decline in their functional status and demonstrates the ability to make significant improvements in function in a reasonable and predictable amount of time. (no further acute care, but HHOT is recommended for making sure pt is functioning at an independent to Mod I at home)     Equipment Recommendations   None recommended by OT      Precautions/Restrictions   Precautions Precautions: Fall Restrictions Weight Bearing Restrictions Per Provider Order: No     Mobility Bed Mobility Overal bed mobility: Independent                  Transfers Overall transfer level: Modified independent Equipment used: Rollator (4 wheels)               General transfer comment: able to stand from stretcher with no assistance and no AD      Balance  Overall balance assessment: Needs assistance Sitting-balance support: No upper extremity supported, Feet supported Sitting balance-Leahy Scale: Good     Standing balance support: Bilateral upper extremity supported, Reliant on assistive device for balance, During functional activity Standing balance-Leahy Scale: Fair Standing balance comment: able to stand with no AD, reliant on UE support during ambulation                           ADL either performed or assessed with clinical judgement   ADL Overall ADL's : Independent                                             Vision Baseline Vision/History: 0 No visual deficits              Pertinent Vitals/Pain Pain Assessment Pain Assessment: No/denies pain     Extremity/Trunk Assessment Upper Extremity Assessment Upper Extremity Assessment: Overall WFL for tasks assessed   Lower Extremity Assessment Lower Extremity Assessment: Overall WFL for tasks assessed       Communication Communication Communication: No apparent difficulties   Cognition Arousal: Alert Behavior During Therapy: [facility] for tasks assessed/performed  Following commands: Intact                  Home Living Family/patient expects to be discharged to:: Private residence Living Arrangements: Alone Available Help at Discharge: Family;Available PRN/intermittently Type of Home: Apartment Home Access: Elevator     Home Layout: One level     Bathroom Shower/Tub: Producer, television/film/video: Handicapped height     Home Equipment: Grab bars - tub/shower;Grab bars - toilet;Shower seat;Cane - single point;Rollator (4 wheels);Cane - Programmer, applications (2 wheels);Adaptive equipment Adaptive Equipment: Reacher;Long-handled shoe horn Additional Comments: walk in shower, normal height toilet      Prior Functioning/Environment Prior Level of Function : Needs assist              Mobility Comments: rollator for mobility ADLs Comments: daughter bring groceries, no longer drives, assist for washing clothes            OT Goals(Current goals can be found in the care plan section)   Acute Rehab OT Goals Patient Stated Goal: to go home today      Co-evaluation PT/OT/SLP Co-Evaluation/Treatment: Yes Reason for Co-Treatment: To address functional/ADL transfers PT goals addressed during session: Mobility/safety with mobility;Balance;Proper use of DME OT goals addressed during session: ADL's and self-care;Strengthening/ROM      AM-PAC OT "6 Clicks" Daily Activity     Outcome Measure Help from another person eating meals?: None Help from another person taking care of personal grooming?: None Help from another person toileting, which includes using toliet, bedpan, or urinal?: None Help from another person bathing (including washing, rinsing, drying)?: None Help from another person to put on and taking off regular upper body clothing?: None Help from another person to put on and taking off regular lower body clothing?: None 6 Click Score: 24   End of Session Equipment Utilized During Treatment: Gait belt;Rolling walker (2 wheels) Nurse Communication: Mobility status  Activity Tolerance: Patient tolerated treatment well Patient left: in chair;with call bell/phone within reach;with chair alarm set  OT Visit Diagnosis: Unsteadiness on feet (R26.81) (safe with rollator)                Time: 1478-2956 OT Time Calculation (min): 23 min Charges:  OT General Charges $OT Visit: 1 Visit OT Evaluation $OT Eval Low Complexity: 1 Low  Lindon Romp OT Acute Rehabilitation Services Office [phone]    Evette Georges 04/09/2023, 10:46 AM

## 2023-04-09 NOTE — H&P (Signed)
History and Physical  Stephanie Atkinson ZOX:[id number] DOB: [birth year] DOA: 04/08/2023  Referring physician: Dr. Lynelle Doctor, EDP  PCP: Linus Galas, NP  Outpatient Specialists: Cardiology. Patient coming from: Home.  Chief Complaint: Generalized weakness.  HPI: Stephanie Atkinson is a 89 y.o. female with medical history significant for chronic HFrEF, CKD 3B, coronary artery disease disease status post CABG in 2006, hypertension, hyperlipidemia, acquired hypothyroidism, paroxysmal A-fib on Eliquis, recently admitted from 04/06/2023 until 04/08/2023 for acute on chronic HFrEF, who presents on the day of discharge with complaints of generalized weakness 45 minutes after she arrived at her home.  She returned to the ER accompanied by her daughter to be evaluated.  In the ER, weak appearing with soft BPs.  Afebrile no leukocytosis.  UA positive for pyuria.  Home oral antihypertensives were held.  She was started on IV antibiotics empirically in the ED.  TRH, hospitalist service, was asked to admit.  ED Course: Temperature 97.4.  BP 118/52, pulse 60, respiration rate 15, saturation 96% on room air.  Lab studies notable for serum sodium 133, chloride 95, glucose 115, creatinine 1.58, calcium 10.8, GFR 30.  CBC unremarkable.  Review of Systems: Review of systems as noted in the HPI. All other systems reviewed and are negative.   Past Medical History:  Diagnosis Date   Chronic kidney disease, stage 3 (HCC)    Coronary artery disease    History of UTI    Hx of CABG 2006   LIMA-LAD, free RIMA-PDA, Radial-OM1-OM2   Hyperlipidemia    Hypertension    Hypothyroid    MI, old        PAF (paroxysmal atrial fibrillation) (HCC)    was on amio, d/c'd due to prolonged PR interval, rate control   Presence of permanent cardiac pacemaker 07/16/2017   Presence of stent in right coronary artery 08/2010   RIMA-PDA occluded, DES stent placed   Syncope 08/2010   bradycardic, beta blocker decreased, also felt to be dehydrated    Past Surgical History:  Procedure Laterality Date   CARDIAC CATHETERIZATION  12/10/2010   dr. Onalee Hua harding, revealing a atretic bypass to her right with patent distal RCA stent   CAROTID STENT  2012   CHOLECYSTECTOMY     CORONARY ARTERY BYPASS GRAFT  2006   LIMA-LAD, free RIMA-PDA, Radial-OM1-OM2   DOPPLER ECHOCARDIOGRAPHY  09/27/2010   mild asymmetric left ventricular hypertrophy, left ventricular systolic function is low normal, ejection fraction = 50-55%, the LA is moderate dilated, the RA is mildly dilated, no significant valvular disease   INSERT / REPLACE / REMOVE PACEMAKER  07/16/2017   LEFT HEART CATHETERIZATION WITH CORONARY/GRAFT ANGIOGRAM N/A 10/12/2012   Procedure: LEFT HEART CATHETERIZATION WITH Isabel Caprice;  Surgeon: Marykay Lex, MD;  Location: [facility] CATH LAB;  Service: Cardiovascular;  Laterality: N/A;   LOOP RECORDER IMPLANT N/A 10/15/2012   Procedure: LOOP RECORDER IMPLANT;  Surgeon: Thurmon Fair, MD;  Location: [facility] CATH LAB;  Service: Cardiovascular;  Laterality: N/A;   LOOP RECORDER REMOVAL  07/16/2017   LOOP RECORDER REMOVAL N/A 07/16/2017   Procedure: LOOP RECORDER REMOVAL;  Surgeon: Thurmon Fair, MD;  Location: [facility] INVASIVE CV LAB;  Service: Cardiovascular;  Laterality: N/A;   NM MYOVIEW LTD  11/29/2008   post stress left ventricle is normal in size, post stress ejection fraction is 67% global left ventricular systolic function is normal, normal myocardial perfusion study, abnormal myocardial perfusion study, low risk scan   PACEMAKER IMPLANT N/A 07/16/2017   Procedure: PACEMAKER IMPLANT;  Surgeon: Thurmon Fair, MD;  Location: [facility] INVASIVE CV LAB;  Service: Cardiovascular;  Laterality: N/A;   THYROIDECTOMY     TOTAL HIP ARTHROPLASTY Right     Social History:  reports that she has never smoked. Her smokeless tobacco use includes snuff. She reports that she does not drink alcohol and does not use drugs.   Allergies  Allergen Reactions    Penicillins Hives   Sulfa Antibiotics Hives and Swelling   Gabapentin Other (See Comments)    Confusion   Lyrica [Pregabalin] Other (See Comments)    Confusion    Family History  Problem Relation Age of Onset   CAD Mother    CAD Maternal Grandmother       Prior to Admission medications   Medication Sig Start Date End Date Taking? Authorizing Provider  acetaminophen (TYLENOL) 500 MG tablet Take 500 mg by mouth See admin instructions. Take 500 mg in am, 1000 mg at 2 pm, 1000 mg 6 pm and 500 mg in pm by mouth.    [provider]  atorvastatin (LIPITOR) 80 MG tablet Take 80 mg by mouth at bedtime.     [provider]  carvedilol (COREG) 3.125 MG tablet TAKE 1 TABLET BY MOUTH 2 TIMES DAILY. Patient taking differently: Take 3.125 mg by mouth every 12 (twelve) hours. 03/13/23   Croitoru, Mihai, MD  Diclofenac Sodium (VOLTAREN EX) Apply 1 Dose topically daily as needed (Pain).    [provider]  ELIQUIS 2.5 MG TABS tablet TAKE 1 TABLET BY MOUTH TWICE A DAY Patient taking differently: Take 2.5 mg by mouth every 12 (twelve) hours. 03/13/23   Croitoru, Mihai, MD  furosemide (LASIX) 20 MG tablet Take 1-2 tablets (20-40 mg total) by mouth See admin instructions. Take 20mg  by mouth on Tuesday, Thursday, Saturday, Sunday. Take 40 mg by mouth on Monday, Wednesday, Friday. 04/09/23   Glade Lloyd, MD  HYDROcodone-acetaminophen (NORCO/VICODIN) 5-325 MG tablet Take 1 tablet by mouth See admin instructions. Take 1 tablet by mouth twice a day. May take a third dose if needed for breakthrough pain. 03/03/23   [provider]  levothyroxine (SYNTHROID, LEVOTHROID) 50 MCG tablet Take 50 mcg by mouth daily before breakfast.     [provider]  lidocaine (LIDODERM) 5 % Place 1 patch onto the skin daily as needed (Pain). 02/25/23   [provider]  Polyethyl Glycol-Propyl Glycol (SYSTANE OP) Place 1 drop into both eyes daily as needed (dry eye).    [provider]  sacubitril-valsartan (ENTRESTO) 24-26 MG Take 1 tablet by mouth 2 (two) times daily. 02/06/23   Croitoru, Mihai, MD  spironolactone (ALDACTONE) 25 MG tablet Take 1 tablet (25 mg total) by mouth daily. 01/22/23   Runell Gess, MD    Physical Exam: BP (!) 148/57   Pulse (!) 59   Temp (!) 97.4 F (36.3 C) (Oral)   Resp 17   SpO2 98%   General: 89 y.o. year-old female frail-appearing in no acute distress.  Alert and oriented x3. Cardiovascular: Regular rate and rhythm with no rubs or gallops.  No thyromegaly or JVD noted.  No lower extremity edema. 2/4 pulses in all 4 extremities. Respiratory: Clear to auscultation with no wheezes or rales. Good inspiratory effort. Abdomen: Soft nontender nondistended with normal bowel sounds x4 quadrants. Muskuloskeletal: No cyanosis, clubbing or edema noted bilaterally Neuro: CN II-XII intact, strength, sensation, reflexes Skin: No ulcerative lesions noted or rashes Psychiatry: Judgement and insight appear normal. Mood is appropriate for  condition and setting          Labs on Admission:  Basic Metabolic Panel: Recent Labs  Lab 04/06/23 1743 04/06/23 2043 04/07/23 0434 04/08/23 0222 04/08/23 1801  NA 128*  --  134* 134* 133*  K 4.2  --  3.8 3.5 4.2  CL 99  --  93* 98 95*  CO2 22  --  22 27 25   GLUCOSE 91  --  94 107* 115*  BUN 16  --  15 16 18   CREATININE 1.23*  --  1.00 1.32* 1.58*  CALCIUM 10.3  --  12.0* 10.3 10.8*  MG  --  1.9 1.8 1.7  --   PHOS  --   --  2.6  --   --    Liver Function Tests: Recent Labs  Lab 04/06/23 1743 04/07/23 0434 04/08/23 1918  AST 26 27 28   ALT 22 21 25   ALKPHOS 61 63 66  BILITOT 0.8 1.3* 0.9  PROT 5.6* 5.8* 6.5  ALBUMIN 3.2* 3.5 3.7   Recent Labs  Lab 04/08/23 1918  LIPASE 67*   No results for input(s): "AMMONIA" in the last 168 hours. CBC: Recent Labs  Lab 04/06/23 1743 04/07/23 0434 04/08/23 1801  WBC 4.1 4.2 6.4  NEUTROABS 2.3 2.7  --   HGB 10.9* 12.1 12.6  HCT  34.4* 36.5 38.0  MCV 91.5 89.0 93.4  PLT 174 199 205   Cardiac Enzymes: No results for input(s): "CKTOTAL", "CKMB", "CKMBINDEX", "TROPONINI" in the last 168 hours.  BNP (last 3 results) Recent Labs    01/08/23 0527 01/16/23 1019 04/06/23 1744  BNP 273.7* 499.1* 339.2*    ProBNP (last 3 results) No results for input(s): "PROBNP" in the last 8760 hours.  CBG: No results for input(s): "GLUCAP" in the last 168 hours.  Radiological Exams on Admission: CT Head Wo Contrast Result Date: 04/08/2023 CLINICAL DATA:  Headache, new onset (Age >= 51y) EXAM: CT HEAD WITHOUT CONTRAST TECHNIQUE: Contiguous axial images were obtained from the base of the skull through the vertex without intravenous contrast. RADIATION DOSE REDUCTION: This exam was performed according to the departmental dose-optimization program which includes automated exposure control, adjustment of the mA and/or kV according to patient size and/or use of iterative reconstruction technique. COMPARISON:  CT head 03/08/2023. FINDINGS: Motion limited study.  Within this limitation: Brain: No evidence of acute infarction, hemorrhage, hydrocephalus, extra-axial collection or mass lesion/mass effect. Remote right cerebellar infarct. Remote left thalamic lacunar infarct. Patchy white matter hypodensities are nonspecific but compatible with chronic microvascular ischemic disease. Vascular: No hyperdense vessel.  Calcific atherosclerosis. Skull: No acute fracture. Sinuses/Orbits: Clear sinuses.  No acute orbital findings. Other: No mastoid effusions. IMPRESSION: 1. No evidence of acute intracranial abnormality on motion limited assessment. Stable at CT. 2. Remote infarcts and chronic microvascular ischemic disease. Electronically Signed   By: Feliberto Harts M.D.   On: 04/08/2023 21:06   CT ABDOMEN PELVIS WO CONTRAST Result Date: 04/08/2023 CLINICAL DATA:  Weakness, abdominal pain, discharge from hospital earlier today EXAM: CT ABDOMEN AND  PELVIS WITHOUT CONTRAST TECHNIQUE: Multidetector CT imaging of the abdomen and pelvis was performed following the standard protocol without IV contrast. RADIATION DOSE REDUCTION: This exam was performed according to the departmental dose-optimization program which includes automated exposure control, adjustment of the mA and/or kV according to patient size and/or use of iterative reconstruction technique. COMPARISON:  03/08/2023 FINDINGS: Lower chest: The heart is enlarged without pericardial effusion. Stable pacemaker. No acute pleural or parenchymal lung disease.  Hepatobiliary: Cholecystectomy. Unremarkable unenhanced appearance of the liver. Pancreas: Unremarkable unenhanced appearance. Spleen: Unremarkable unenhanced appearance. Adrenals/Urinary Tract: No urinary tract calculi or obstructive uropathy within either kidney. Stable bilateral renal cysts do not require specific imaging follow-up. The adrenals and bladder are unremarkable. Stomach/Bowel: No bowel obstruction or ileus. Normal appendix right lower quadrant. Diverticulosis of the descending and sigmoid colon without evidence of acute diverticulitis. No bowel wall thickening or inflammatory change. Vascular/Lymphatic: Aortic atherosclerosis. No enlarged abdominal or pelvic lymph nodes. Reproductive: Uterus and bilateral adnexa are unremarkable. Other: No free fluid or free intraperitoneal gas. No abdominal wall hernia. Musculoskeletal: Right hip arthroplasty. Diffuse osteopenia. No acute or destructive bony abnormalities. Reconstructed images demonstrate no additional findings. IMPRESSION: 1. No acute intra-abdominal or intrapelvic process. 2. Distal colonic diverticulosis without diverticulitis. 3. Cardiomegaly. 4.  Aortic Atherosclerosis (ICD10-I70.0). Electronically Signed   By: Sharlet Salina M.D.   On: 04/08/2023 21:06    EKG: I independently viewed the EKG done and my findings are as followed: Normal sinus rhythm rate of 60.  Nonspecific ST-T  changes QTc 467.  Assessment/Plan Present on Admission: **None**  Principal Problem:   Generalized weakness  Generalized weakness, suspect multifactorial Possible overdiuresis, presumptive UTI, POA Treat underlying conditions Hold off home diuretics for now Continue Rocephin started in the ER. Follow urine culture if negative DC antibiotics PT OT evaluation Fall precautions.  Presumptive UTI, POA Follow urine culture for ID and sensitivities If negative DC antibiotics Narrow down antibiotics as able.  Chronic HFrEF Euvolemic on exam Hold off home diuretics today due to soft Bps Diuretics will need to be restarted when appropriate Start strict I's and O's and daily weight  Paroxysmal A-fib on Eliquis Resume home Eliquis Currently rate controlled Monitor on telemetry  Hypothyroidism Resume home levothyroxine  CKD 3B Close to baseline renal function Monitor urine output Avoid nephrotoxic agents and hypotension.  Coronary artery disease status post CABG Resume home Lipitor and Eliquis, not on antiplatelets.   Time: 75 minutes.   DVT prophylaxis: Home Eliquis  Code Status: DNR  Family Communication: None at bedside at the time of this visit.  Disposition Plan: Admitted to telemetry cardiac unit  Consults called: None.  Admission status: Observation status   Status is: Observation    Darlin Drop MD [facility] Pager [phone]  If 7PM-7AM, please contact night-coverage [email or web address] Password [facility]  04/09/2023, 5:04 AM

## 2023-04-09 NOTE — Evaluation (Signed)
Physical Therapy Brief Evaluation and Discharge Note Patient Details Name: Stephanie Atkinson MRN: [id number] DOB: 26-Feb-1924 Today's Date: 04/09/2023   History of Present Illness  Pt is a 89 y.o. female recently admitted from 04/06/2023 until 04/08/2023 for acute on chronic HFrEF, who presents on the day of discharge with complaints of generalized weakness 45 minutes after she arrived at her home. Possible overdiuresis, presumptive UTI, euvolemic on exam. PMH: HTN, HLD, CAD s/p CABG, paroxysmal atrial fibrillation, hypothyroidism, CKD stage 3B, MI, syncope, chronic HFrEF, R THA   Clinical Impression  Pt in stretcher upon arrival and agreeable to PT eval. Prior to admit, pt was ModI with a rollator for mobility. Pt with recent d/c from ED and returned due to generalized weakness. In today's session, pt was ModI for mobility with rollator and was able to ambulate ~200 ft. Pt has no acute PT needs as pt is ModI for mobility. Pt would benefit from mobility specialist services while in the hospital to ensure continued mobility. Recommending post-acute HHPT to work on activity tolerance and progression. Pt has questions about safety and is ready to d/c home when medically stable. Acute PT signing off. Please re-consult if any changes to POC.         PT Assessment Patient does not need any further PT services  Assistance Needed at Discharge  None    Equipment Recommendations None recommended by PT     Precautions/Restrictions Precautions Precautions: Fall Restrictions Weight Bearing Restrictions Per Provider Order: No        Mobility  Bed Mobility  Supine/Sidelying to sit: Independent   General bed mobility comments: no physical assist needed  Transfers Overall transfer level: Modified independent Equipment used: None, Rollator (4 wheels)    General transfer comment: able to stand from stretcher with no assistance and no AD    Ambulation/Gait Ambulation/Gait assistance: Modified  independent (Device/Increase time) Gait Distance (Feet): 200 Feet Assistive device: Rollator (4 wheels) Gait Pattern/deviations: Step-through pattern, Decreased stride length, Trunk flexed Gait Speed: Below normal General Gait Details: steady gait with no LOB, ModI    Balance Overall balance assessment: Needs assistance Sitting-balance support: No upper extremity supported, Feet supported Sitting balance-Leahy Scale: Good     Standing balance support: Bilateral upper extremity supported, Reliant on assistive device for balance, During functional activity Standing balance-Leahy Scale: Fair Standing balance comment: able to stand with no AD, reliant on UE support during ambulation       Pertinent Vitals/Pain PT - Brief Vital Signs All Vital Signs Stable: Yes Pain Assessment Pain Assessment: No/denies pain Pain Intervention(s): Limited activity within patient's tolerance, Monitored during session, Repositioned     Home Living Family/patient expects to be discharged to:: Private residence Living Arrangements: Alone Available Help at Discharge: Family;Available PRN/intermittently Home Environment: Level entry (elevator to 2nd floor)   Home Equipment: Grab bars - tub/shower;Grab bars - toilet;Shower seat;Cane - single point;Rollator (4 wheels);Cane - Programmer, applications (2 wheels);Adaptive equipment Adaptive Equipment: Reacher;Long-handled shoe horn Additional Comments: walk in shower, normal height toilet    Prior Function Level of Independence: Independent with assistive device(s) Comments: ModI with rollator    UE/LE Assessment   UE ROM/Strength/Tone/Coordination: WFL    LE ROM/Strength/Tone/Coordination: [facility]      Communication   Communication Communication: No apparent difficulties     Cognition Overall Cognitive Status: Appears within functional limits for tasks assessed/performed       General Comments General comments (skin integrity, edema, etc.): VSS on  RA  PT Visit Diagnosis Other abnormalities of gait and mobility (R26.89)    No Skilled PT All education completed;Patient is modified independent with all activity/mobility   Co-evaluation   Reason for Co-Treatment: To address functional/ADL transfers PT goals addressed during session: Mobility/safety with mobility;Balance;Proper use of DME          AMPAC 6 Clicks Help needed turning from your back to your side while in a flat bed without using bedrails?: None Help needed moving from lying on your back to sitting on the side of a flat bed without using bedrails?: None Help needed moving to and from a bed to a chair (including a wheelchair)?: None Help needed standing up from a chair using your arms (e.g., wheelchair or bedside chair)?: None Help needed to walk in hospital room?: None Help needed climbing 3-5 steps with a railing? : A Little 6 Click Score: 23      End of Session Equipment Utilized During Treatment: Gait belt Activity Tolerance: Patient tolerated treatment well Patient left: in chair;with call bell/phone within reach;with chair alarm set Nurse Communication: Mobility status PT Visit Diagnosis: Other abnormalities of gait and mobility (R26.89)     Time: 1610-9604 PT Time Calculation (min) (ACUTE ONLY): 23 min  Charges:   PT Evaluation $PT Eval Low Complexity: 1 Low     Hilton Cork, PT, DPT Secure Chat Preferred  Rehab Office [phone]   Arturo Morton [facility]  04/09/2023, 10:01 AM

## 2023-04-10 DIAGNOSIS — I5032 Chronic diastolic (congestive) heart failure: Secondary | ICD-10-CM | POA: Diagnosis not present

## 2023-04-10 DIAGNOSIS — N3000 Acute cystitis without hematuria: Secondary | ICD-10-CM | POA: Diagnosis not present

## 2023-04-10 DIAGNOSIS — I25810 Atherosclerosis of coronary artery bypass graft(s) without angina pectoris: Secondary | ICD-10-CM | POA: Diagnosis not present

## 2023-04-10 DIAGNOSIS — E782 Mixed hyperlipidemia: Secondary | ICD-10-CM | POA: Diagnosis not present

## 2023-04-10 DIAGNOSIS — R531 Weakness: Secondary | ICD-10-CM | POA: Diagnosis not present

## 2023-04-10 DIAGNOSIS — E039 Hypothyroidism, unspecified: Secondary | ICD-10-CM | POA: Diagnosis not present

## 2023-04-10 DIAGNOSIS — R55 Syncope and collapse: Secondary | ICD-10-CM | POA: Diagnosis not present

## 2023-04-10 DIAGNOSIS — I10 Essential (primary) hypertension: Secondary | ICD-10-CM | POA: Diagnosis not present

## 2023-04-10 DIAGNOSIS — N1832 Chronic kidney disease, stage 3b: Secondary | ICD-10-CM | POA: Diagnosis not present

## 2023-04-10 LAB — URINE CULTURE: Culture: NO GROWTH

## 2023-04-10 LAB — CBC
HCT: 34.2 % — ABNORMAL LOW (ref 36.0–46.0)
Hemoglobin: 11.3 g/dL — ABNORMAL LOW (ref 12.0–15.0)
MCH: 29.5 pg (ref 26.0–34.0)
MCHC: 33 g/dL (ref 30.0–36.0)
MCV: 89.3 fL (ref 80.0–100.0)
Platelets: 195 10*3/uL (ref 150–400)
RBC: 3.83 MIL/uL — ABNORMAL LOW (ref 3.87–5.11)
RDW: 14 % (ref 11.5–15.5)
WBC: 4.3 10*3/uL (ref 4.0–10.5)
nRBC: 0 % (ref 0.0–0.2)

## 2023-04-10 LAB — BASIC METABOLIC PANEL
Anion gap: 9 (ref 5–15)
BUN: 18 mg/dL (ref 8–23)
CO2: 24 mmol/L (ref 22–32)
Calcium: 9.8 mg/dL (ref 8.9–10.3)
Chloride: 102 mmol/L (ref 98–111)
Creatinine, Ser: 1.22 mg/dL — ABNORMAL HIGH (ref 0.44–1.00)
GFR, Estimated: 41 mL/min — ABNORMAL LOW (ref >60)
Glucose, Bld: 94 mg/dL (ref 70–99)
Potassium: 3.5 mmol/L (ref 3.5–5.1)
Sodium: 135 mmol/L (ref 135–145)

## 2023-04-10 MED ORDER — ENSURE ENLIVE PO LIQD
237.0000 mL | Freq: Two times a day (BID) | ORAL | Status: DC
  Administered 2023-04-10 – 2023-04-24 (×21): 237 mL via ORAL

## 2023-04-10 MED ORDER — POTASSIUM CHLORIDE CRYS ER 20 MEQ PO TBCR
20.0000 meq | EXTENDED_RELEASE_TABLET | Freq: Once | ORAL | Status: AC
  Administered 2023-04-10: 20 meq via ORAL
  Filled 2023-04-10: qty 1

## 2023-04-10 MED ORDER — OXYCODONE HCL 5 MG PO TABS
2.5000 mg | ORAL_TABLET | Freq: Once | ORAL | Status: AC
  Administered 2023-04-10: 2.5 mg via ORAL
  Filled 2023-04-10: qty 1

## 2023-04-10 NOTE — Progress Notes (Signed)
PROGRESS NOTE    Stephanie Atkinson  GNF:[id number] DOB: March 16, 1924 DOA: 04/08/2023 PCP: Linus Galas, NP    Chief Complaint  Patient presents with   Weakness    Brief Narrative:  Patient 89 year old female history of chronic HF R EF, CKD 3B, CAD status post CABG in 2006, hypertension, hyperlipidemia, hypothyroidism, paroxysmal A-fib on Eliquis recently admitted from 04/06/2023-04/08/2023 for acute on chronic HFrEF presenting on day of discharge with complaints of generalized weakness 45 minutes after she arrived at home.  Daughter describes episode which seems more presyncope nature.  Patient subsequently brought to the ED.  On presentation to the ED patient noted to have soft blood pressures, afebrile, no leukocytosis, urinalysis consistent with a UTI.  Patient placed on antibiotics.  Antihypertensive medications held.     Assessment & Plan:   Principal Problem:   Generalized weakness Active Problems:   Near syncope   Symptomatic bradycardia s/p PPM    CKD stage 3b, GFR 30-44 ml/min (HCC)   Acquired hypothyroidism   Essential hypertension   Dyslipidemia   CAD (coronary artery disease) of artery bypass graft   Dehydration   Paroxysmal atrial fibrillation (HCC)   HLD (hyperlipidemia)   Chronic diastolic heart failure (HCC)   UTI (urinary tract infection)  #1 generalized weakness/near syncope -For likely multifactorial secondary to overdiuresis during recent hospitalization in the setting of presumptive UTI, POA. -Patient noted to have soft blood pressure on admission. -Patient's home diuretics and antihypertensive medications currently on hold. -Patient with clinical improvement with systolic blood pressure slowly improving.   -Urine cultures negative.   -Status post fosfomycin.   -Clinical improvement.   -PT/OT.   -Fall precautions.    2.  Presumptive UTI, POA -Urine cultures negative.  -Patient afebrile. -Status post fosfomycin. -Supportive care. -No further antibiotics  needed.  3.  Chronic HFrEF -Euvolemic on examination. -Continue to hold home regimen of diuretics due to soft blood pressure. -Continue to hold Coreg, Entresto, Lasix. -Cardiology consulted to help with medication management.  4.  Paroxysmal A-fib on Eliquis -Currently rate controlled.   -Eliquis for anticoagulation.    5.  Hypothyroidism -Continue home regimen Synthroid.  6.  CKD 3B -Stable. -Close to baseline.  7.  CAD status post CABG -Continue home regimen Lipitor and Eliquis. -Continue to hold Coreg, continue to hold diuretics and Entresto and spironolactone. -Cardiology consulted.    DVT prophylaxis: Eliquis Code Status: DNR Family Communication: Updated patient.  No family at bedside.  Disposition: TBD  Status is: Observation The patient remains OBS appropriate and will d/c before 2 midnights.   Consultants:  Cardiology:   Procedures:  CT head 04/08/2023 CT abdomen pelvis 04/08/2023  Antimicrobials:  Anti-infectives (From admission, onward)    Start     Dose/Rate Route Frequency Ordered Stop   04/09/23 0915  cefTRIAXone (ROCEPHIN) 2 g in sodium chloride 0.9 % 100 mL IVPB  Status:  Discontinued        2 g 200 mL/hr over 30 Minutes Intravenous Every 24 hours 04/09/23 0909 04/09/23 0933   04/08/23 2245  fosfomycin (MONUROL) packet 3 g        3 g Oral  Once 04/08/23 2232 04/09/23 0035         Subjective: Sitting up in bed eating lunch.  Denies any chest pain or shortness of breath.  No lightheadedness or dizziness.  Overall feels well.    Objective: Vitals:   04/10/23 0418 04/10/23 0858 04/10/23 1129 04/10/23 1525  BP:  (!) 127/45 (!) 102/50 [name])  136/52  Pulse:  60 60 61  Resp:  18 18 18   Temp:  98.7 F (37.1 C) 98 F (36.7 C) 97.9 F (36.6 C)  TempSrc:  Oral Oral Oral  SpO2:  97% 99% 100%  Weight: 55.9 kg     Height:        Intake/Output Summary (Last 24 hours) at 04/10/2023 1923 Last data filed at 04/10/2023 1200 Gross per 24 hour  Intake  560 ml  Output --  Net 560 ml   Filed Weights   04/09/23 1618 04/09/23 1642 04/10/23 0418  Weight: 56.7 kg 57.3 kg 55.9 kg    Examination:  General exam: NAD Respiratory system: Lungs clear to auscultation bilaterally.  No wheezes, no crackles, no rhonchi.  Fair air movement.  Speaking in full sentences.  Cardiovascular system: Bradycardia.  No murmurs rubs or gallops.  No JVD.  No pitting lower extremity edema.  Gastrointestinal system: Abdomen is soft, nontender, nondistended, positive bowel sounds.  No rebound.  No guarding.   Central nervous system: Alert and oriented. No focal neurological deficits. Extremities: Symmetric 5 x 5 power. Skin: No rashes, lesions or ulcers Psychiatry: Judgement and insight appear normal. Mood & affect appropriate.     Data Reviewed: I have personally reviewed following labs and imaging studies  CBC: Recent Labs  Lab 04/06/23 1743 04/07/23 0434 04/08/23 1801 04/09/23 0525 04/10/23 0604  WBC 4.1 4.2 6.4 5.4 4.3  NEUTROABS 2.3 2.7  --   --   --   HGB 10.9* 12.1 12.6 12.2 11.3*  HCT 34.4* 36.5 38.0 37.3 34.2*  MCV 91.5 89.0 93.4 89.7 89.3  PLT 174 199 205 257 195    Basic Metabolic Panel: Recent Labs  Lab 04/06/23 2043 04/07/23 0434 04/08/23 0222 04/08/23 1801 04/09/23 0630 04/10/23 0604  NA  --  134* 134* 133* 134* 135  K  --  3.8 3.5 4.2 3.7 3.5  CL  --  93* 98 95* 101 102  CO2  --  22 27 25 23 24   GLUCOSE  --  94 107* 115* 96 94  BUN  --  15 16 18 17 18   CREATININE  --  1.00 1.32* 1.58* 1.39* 1.22*  CALCIUM  --  12.0* 10.3 10.8* 10.5* 9.8  MG 1.9 1.8 1.7  --  1.8  --   PHOS  --  2.6  --   --  2.4*  --     GFR: Estimated Creatinine Clearance: 21.3 mL/min (A) (by C-G formula based on SCr of 1.22 mg/dL (H)).  Liver Function Tests: Recent Labs  Lab 04/06/23 1743 04/07/23 0434 04/08/23 1918  AST 26 27 28   ALT 22 21 25   ALKPHOS 61 63 66  BILITOT 0.8 1.3* 0.9  PROT 5.6* 5.8* 6.5  ALBUMIN 3.2* 3.5 3.7    CBG: No  results for input(s): "GLUCAP" in the last 168 hours.   Recent Results (from the past 240 hours)  MRSA Next Gen by PCR, Nasal     Status: None   Collection Time: 04/07/23  3:07 PM   Specimen: Nasal Mucosa; Nasal Swab  Result Value Ref Range Status   MRSA by PCR Next Gen NOT DETECTED NOT DETECTED Final    Comment: (NOTE) The GeneXpert MRSA Assay (FDA approved for NASAL specimens only), is one component of a comprehensive MRSA colonization surveillance program. It is not intended to diagnose MRSA infection nor to guide or monitor treatment for MRSA infections. Test performance is not FDA approved in patients  less than 41 years old. Performed at [facility], [address], [place], [place] [address]   Urine Culture     Status: None   Collection Time: 04/09/23  7:27 AM   Specimen: Urine, Random  Result Value Ref Range Status   Specimen Description URINE, RANDOM  Final   Special Requests NONE Reflexed from [id number] [phone]  Final   Culture   Final    NO GROWTH Performed at [facility], [address], [place], [place] [address]    Report Status 04/10/2023 FINAL  Final         Radiology Studies: CT Head Wo Contrast Result Date: 04/08/2023 CLINICAL DATA:  Headache, new onset (Age >= 51y) EXAM: CT HEAD WITHOUT CONTRAST TECHNIQUE: Contiguous axial images were obtained from the base of the skull through the vertex without intravenous contrast. RADIATION DOSE REDUCTION: This exam was performed according to the departmental dose-optimization program which includes automated exposure control, adjustment of the mA and/or kV according to patient size and/or use of iterative reconstruction technique. COMPARISON:  CT head 03/08/2023. FINDINGS: Motion limited study.  Within this limitation: Brain: No evidence of acute infarction, hemorrhage, hydrocephalus, extra-axial collection or mass lesion/mass effect. Remote right cerebellar infarct. Remote left thalamic lacunar infarct. Patchy white  matter hypodensities are nonspecific but compatible with chronic microvascular ischemic disease. Vascular: No hyperdense vessel.  Calcific atherosclerosis. Skull: No acute fracture. Sinuses/Orbits: Clear sinuses.  No acute orbital findings. Other: No mastoid effusions. IMPRESSION: 1. No evidence of acute intracranial abnormality on motion limited assessment. Stable at CT. 2. Remote infarcts and chronic microvascular ischemic disease. Electronically Signed   By: Feliberto Harts M.D.   On: 04/08/2023 21:06   CT ABDOMEN PELVIS WO CONTRAST Result Date: 04/08/2023 CLINICAL DATA:  Weakness, abdominal pain, discharge from hospital earlier today EXAM: CT ABDOMEN AND PELVIS WITHOUT CONTRAST TECHNIQUE: Multidetector CT imaging of the abdomen and pelvis was performed following the standard protocol without IV contrast. RADIATION DOSE REDUCTION: This exam was performed according to the departmental dose-optimization program which includes automated exposure control, adjustment of the mA and/or kV according to patient size and/or use of iterative reconstruction technique. COMPARISON:  03/08/2023 FINDINGS: Lower chest: The heart is enlarged without pericardial effusion. Stable pacemaker. No acute pleural or parenchymal lung disease. Hepatobiliary: Cholecystectomy. Unremarkable unenhanced appearance of the liver. Pancreas: Unremarkable unenhanced appearance. Spleen: Unremarkable unenhanced appearance. Adrenals/Urinary Tract: No urinary tract calculi or obstructive uropathy within either kidney. Stable bilateral renal cysts do not require specific imaging follow-up. The adrenals and bladder are unremarkable. Stomach/Bowel: No bowel obstruction or ileus. Normal appendix right lower quadrant. Diverticulosis of the descending and sigmoid colon without evidence of acute diverticulitis. No bowel wall thickening or inflammatory change. Vascular/Lymphatic: Aortic atherosclerosis. No enlarged abdominal or pelvic lymph nodes.  Reproductive: Uterus and bilateral adnexa are unremarkable. Other: No free fluid or free intraperitoneal gas. No abdominal wall hernia. Musculoskeletal: Right hip arthroplasty. Diffuse osteopenia. No acute or destructive bony abnormalities. Reconstructed images demonstrate no additional findings. IMPRESSION: 1. No acute intra-abdominal or intrapelvic process. 2. Distal colonic diverticulosis without diverticulitis. 3. Cardiomegaly. 4.  Aortic Atherosclerosis (ICD10-I70.0). Electronically Signed   By: Sharlet Salina M.D.   On: 04/08/2023 21:06        Scheduled Meds:  acetaminophen  500 mg Oral TID   apixaban  2.5 mg Oral BID   atorvastatin  80 mg Oral QHS   feeding supplement  237 mL Oral BID BM   levothyroxine  50 mcg Oral QAC  breakfast   phosphorus  250 mg Oral BID   Continuous Infusions:   LOS: 0 days    Time spent: 35 minutes    Ramiro Harvest, MD [facility]   To contact the attending provider between 7A-7P or the covering provider during after hours 7P-7A, please log into the web site [email or web address] and access using universal [place] password for that web site. If you do not have the password, please call the hospital operator.  04/10/2023, 7:23 PM

## 2023-04-10 NOTE — Evaluation (Signed)
Physical Therapy Evaluation Patient Details Name: Stephanie Atkinson MRN: [id number] DOB: [birth year] Today's Date: 04/10/2023  History of Present Illness  Pt is a 89 y.o. female recently admitted from 04/06/2023 until 04/08/2023 for acute on chronic HFrEF, who presents on the day of discharge with complaints of generalized weakness 45 minutes after she arrived at her home. Possible overdiuresis, presumptive UTI, euvolemic on exam. PMH: HTN, HLD, CAD s/p CABG, paroxysmal atrial fibrillation, hypothyroidism, CKD stage 3B, MI, syncope, chronic HFrEF, R THA  Clinical Impression   Pt admitted secondary to problem above with deficits below. PTA patient was living alone and reports fall 1 month ago. Pt currently requires supervision with moderate cues for safe/proper use of rollator during transfers. She scored 1 minute 19 seconds on TUG with rollator. If TUG >30 seconds in community-dwelling older adults then at incr risk of falling and recommend supervision for transfers and gait.  Anticipate patient will benefit from PT to address problems listed below. Will continue to follow acutely to maximize functional mobility independence and safety. Patient will benefit from continued inpatient follow up therapy, <3 hours/day          If plan is discharge home, recommend the following: Assist for transportation;Assistance with cooking/housework;A lot of help with walking and/or transfers   Can travel by private vehicle   Yes    Equipment Recommendations None recommended by PT  Recommendations for Other Services       Functional Status Assessment Patient has had a recent decline in their functional status and demonstrates the ability to make significant improvements in function in a reasonable and predictable amount of time.     Precautions / Restrictions Precautions Precautions: Fall Restrictions Weight Bearing Restrictions Per Provider Order: No      Mobility  Bed Mobility                General bed mobility comments: pt up in chair upon arrival    Transfers Overall transfer level: Needs assistance Equipment used: Rollator (4 wheels), Rolling walker (2 wheels) Transfers: Sit to/from Stand Sit to Stand: Supervision           General transfer comment: from recliner and from rollator; needs cues for locking brakes and reaching back to surface x 3 during session    Ambulation/Gait Ambulation/Gait assistance: Supervision Gait Distance (Feet): 75 Feet (in hall; 10, 20, 20 in room) Assistive device: Rollator (4 wheels) Gait Pattern/deviations: Step-through pattern, Decreased stride length, Trunk flexed   Gait velocity interpretation: 1.31 - 2.62 ft/sec, indicative of limited community ambulator   General Gait Details: slow cadence in room, one attempt to walk without rollator as she finished at the sink and required cue to use it for safety  Stairs            Wheelchair Mobility     Tilt Bed    Modified Rankin (Stroke Patients Only)       Balance Overall balance assessment: Needs assistance Sitting-balance support: No upper extremity supported, Feet supported Sitting balance-Leahy Scale: Good     Standing balance support: No upper extremity supported, During functional activity Standing balance-Leahy Scale: Fair Standing balance comment: able to stand at sink and do grooming tasks without LOB                             Pertinent Vitals/Pain Pain Assessment Pain Assessment: Faces Faces Pain Scale: Hurts even more Pain Location: left knee (pt feels it's due  to lidocaine patch that bothered her, dtr thinks its her sciatica making her knee hurt because this is usually where it does hurt) Pain Descriptors / Indicators: Aching, Sore Pain Intervention(s): Limited activity within patient's tolerance    Home Living Family/patient expects to be discharged to:: Private residence Living Arrangements: Alone Available Help at Discharge:  Family;Available PRN/intermittently Type of Home: Apartment Home Access: Elevator       Home Layout: One level Home Equipment: Grab bars - tub/shower;Grab bars - toilet;Shower seat;Cane - single point;Rollator (4 wheels);Cane - Programmer, applications (2 wheels);Adaptive equipment Additional Comments: walk in shower, normal height toilet; dtr lives in [place] and calls pt every day and comes to see her 2-3 times a week    Prior Function Prior Level of Function : Needs assist             Mobility Comments: rollator for mobility; reports fell in bathroom ~ 1 month ago ADLs Comments: daughter brings groceries, no longer drives, assist for washing clothes     Extremity/Trunk Assessment   Upper Extremity Assessment Upper Extremity Assessment: Defer to OT evaluation    Lower Extremity Assessment Lower Extremity Assessment: Generalized weakness    Cervical / Trunk Assessment Cervical / Trunk Assessment: Kyphotic  Communication   Communication Communication: No apparent difficulties    Cognition Arousal: Alert Behavior During Therapy: WFL for tasks assessed/performed   PT - Cognitive impairments: Memory                       PT - Cognition Comments: required cues to lock brakes prior to sitting x 3 during session; required cue x1 to take rollator with her when leaving the sink area Following commands: Intact       Cueing Cueing Techniques: Verbal cues     General Comments General comments (skin integrity, edema, etc.): TUG 1 min 19 seconds (>30 seconds in community-dwelling older adults indicative of incr fall risk).    Exercises     Assessment/Plan    PT Assessment Patient needs continued PT services  PT Problem List Decreased strength;Decreased balance;Decreased mobility;Decreased knowledge of use of DME;Decreased safety awareness       PT Treatment Interventions DME instruction;Gait training;Functional mobility training;Therapeutic  activities;Therapeutic exercise;Balance training;Cognitive remediation;Patient/family education    PT Goals (Current goals can be found in the Care Plan section)  Acute Rehab PT Goals Patient Stated Goal: to not fall PT Goal Formulation: With patient/family Time For Goal Achievement: 04/24/23 Potential to Achieve Goals: Good    Frequency Min 1X/week     Co-evaluation               AM-PAC PT "6 Clicks" Mobility  Outcome Measure Help needed turning from your back to your side while in a flat bed without using bedrails?: None Help needed moving from lying on your back to sitting on the side of a flat bed without using bedrails?: None Help needed moving to and from a bed to a chair (including a wheelchair)?: A Little Help needed standing up from a chair using your arms (e.g., wheelchair or bedside chair)?: A Little Help needed to walk in hospital room?: A Little Help needed climbing 3-5 steps with a railing? : A Little 6 Click Score: 20    End of Session Equipment Utilized During Treatment: Gait belt Activity Tolerance: Patient tolerated treatment well Patient left: in chair;with call bell/phone within reach;with chair alarm set Nurse Communication: Mobility status PT Visit Diagnosis: Other abnormalities of gait and  mobility (R26.89);History of falling (Z91.81)    Time: 1025-1100 PT Time Calculation (min) (ACUTE ONLY): 35 min   Charges:   PT Evaluation $PT Eval Low Complexity: 1 Low PT Treatments $Gait Training: 8-22 mins PT General Charges $$ ACUTE PT VISIT: 1 Visit          Jerolyn Center, PT Acute Rehabilitation Services  Office [phone]   Zena Amos 04/10/2023, 12:09 PM

## 2023-04-10 NOTE — TOC Progression Note (Addendum)
Transition of Care [facility]) - Progression Note    Patient Details  Name: Stephanie Atkinson MRN: [id number] Date of Birth: 10/20/1924  [facility] CM/SW Contact  Kermit Balo, RN Phone Number: 04/10/2023, 11:32 AM  Clinical Narrative:     Therapies re-evaluated and plan is to try for SNF rehab before home. Daughter in agreement with having her faxed out in the [place] area.  Will provide bed offers once available.  TOC following.  1437: Daughter provided bed offers and she selected [facility]. CM has asked TOC MOA to begin insurance auth.   Expected Discharge Plan: Home w Home Health Services Barriers to Discharge: Continued Medical Work up  Expected Discharge Plan and Services   Discharge Planning Services: CM Consult   Living arrangements for the past 2 months: Apartment                                       Social Determinants of Health (SDOH) Interventions SDOH Screenings   Food Insecurity: No Food Insecurity (04/09/2023)  Housing: Low Risk  (04/09/2023)  Transportation Needs: No Transportation Needs (04/09/2023)  Utilities: Not At Risk (04/09/2023)  Financial Resource Strain: Low Risk  (03/04/2023)  Social Connections: Socially Isolated (04/09/2023)  Tobacco Use: High Risk (04/09/2023)    Readmission Risk Interventions    03/11/2023   10:35 AM  Readmission Risk Prevention Plan  Transportation Screening Complete  PCP or Specialist Appt within 5-7 Days Complete  Home Care Screening Complete  Medication Review (RN CM) Complete

## 2023-04-10 NOTE — Consult Note (Signed)
Cardiology Consultation   Patient ID: Stephanie Atkinson MRN: [id number]; DOB: 03-18-1924  Admit date: 04/08/2023 Date of Consult: 04/10/2023  PCP:  Linus Galas, NP   [facility] Providers Cardiologist:  Nanetta Batty, MD  Electrophysiologist:  Thurmon Fair, MD  {  Patient Profile:   Stephanie Atkinson is a 89 y.o. female with a hx of CAD status post CABG x 4 in 2006, DES to PDA 2012, permanent atrial fibrillation, ischemic cardiomyopathy with reduced EF, syncope status post loop recorder 2014, symptomatic bradycardia status post PPM 2019, hypertension, hyperlipidemia, CKD, hypothyroidism who is being seen 04/10/2023 for the evaluation of med reconciliation/presyncope at the request of Dr. Janee Morn.  History of Present Illness:   Stephanie Atkinson was just seen recently here for CHF exacerbation 02/10.  Please see previous consult note on 04/06/2022 for complete history of prior cardiac history.  Cardiology was asked to see today for med reconciliation in the setting of presyncopal event.  Reportedly she got home, felt dizzy and then immediately came back to the ER.  On 04/07/2023 patient was seen by cardiology for CHF exacerbation noting more DOE and bilateral lower extremity edema for the past 2 weeks.  During that admission she had mild hyponatremia, started on IV Lasix 40 mg twice daily with sufficient diuresis by the time we had seen her.  She was continued on her normal GDMT regimen of carvedilol, Entresto. Spironolactone was restarted.  No SGLT2 inhibitor due to recurrent UTIs in the past.  Diuretic was not uptitrated due to previous history of orthostatic hypotension. PTA Lasix 20 mg and 40 mg every other day dosing   I went to go see patient to get complete history of events however she said that she did not have a great memory and referred me to her daughter instead. However patient did admit to feeling dizzy, was not able to clarify whether this was positional or exact details.  She  reports feeling well at this time with no shortness of breath, dizziness, chest pain.  She is comfortable and reports no complaints.  Attempted to call daughter twice and no response.   Here in the ER she had a UA noting leukocytosis and prophylactically started on IV antibiotics empirically for UTI.  Afebrile.  Has had some low blood pressure readings in the low 100s.  Creatinine 1.22.  Potassium 3.5.  BUN 18.  Hemoglobin 11.3.   Past Medical History:  Diagnosis Date   Chronic kidney disease, stage 3 (HCC)    Coronary artery disease    History of UTI    Hx of CABG 2006   LIMA-LAD, free RIMA-PDA, Radial-OM1-OM2   Hyperlipidemia    Hypertension    Hypothyroid    MI, old        PAF (paroxysmal atrial fibrillation) (HCC)    was on amio, d/c'd due to prolonged PR interval, rate control   Presence of permanent cardiac pacemaker 07/16/2017   Presence of stent in right coronary artery 08/2010   RIMA-PDA occluded, DES stent placed   Syncope 08/2010   bradycardic, beta blocker decreased, also felt to be dehydrated    Past Surgical History:  Procedure Laterality Date   CARDIAC CATHETERIZATION  12/10/2010   dr. Onalee Hua harding, revealing a atretic bypass to her right with patent distal RCA stent   CAROTID STENT  2012   CHOLECYSTECTOMY     CORONARY ARTERY BYPASS GRAFT  2006   LIMA-LAD, free RIMA-PDA, Radial-OM1-OM2   DOPPLER ECHOCARDIOGRAPHY  09/27/2010  mild asymmetric left ventricular hypertrophy, left ventricular systolic function is low normal, ejection fraction = 50-55%, the LA is moderate dilated, the RA is mildly dilated, no significant valvular disease   INSERT / REPLACE / REMOVE PACEMAKER  07/16/2017   LEFT HEART CATHETERIZATION WITH CORONARY/GRAFT ANGIOGRAM N/A 10/12/2012   Procedure: LEFT HEART CATHETERIZATION WITH Isabel Caprice;  Surgeon: Marykay Lex, MD;  Location: [facility] CATH LAB;  Service: Cardiovascular;  Laterality: N/A;   LOOP RECORDER IMPLANT N/A 10/15/2012    Procedure: LOOP RECORDER IMPLANT;  Surgeon: Thurmon Fair, MD;  Location: [facility] CATH LAB;  Service: Cardiovascular;  Laterality: N/A;   LOOP RECORDER REMOVAL  07/16/2017   LOOP RECORDER REMOVAL N/A 07/16/2017   Procedure: LOOP RECORDER REMOVAL;  Surgeon: Thurmon Fair, MD;  Location: [facility] INVASIVE CV LAB;  Service: Cardiovascular;  Laterality: N/A;   NM MYOVIEW LTD  11/29/2008   post stress left ventricle is normal in size, post stress ejection fraction is 67% global left ventricular systolic function is normal, normal myocardial perfusion study, abnormal myocardial perfusion study, low risk scan   PACEMAKER IMPLANT N/A 07/16/2017   Procedure: PACEMAKER IMPLANT;  Surgeon: Thurmon Fair, MD;  Location: [facility] INVASIVE CV LAB;  Service: Cardiovascular;  Laterality: N/A;   THYROIDECTOMY     TOTAL HIP ARTHROPLASTY Right      Inpatient Medications: Scheduled Meds:  acetaminophen  500 mg Oral TID   apixaban  2.5 mg Oral BID   atorvastatin  80 mg Oral QHS   feeding supplement  237 mL Oral BID BM   levothyroxine  50 mcg Oral QAC breakfast   phosphorus  250 mg Oral BID   Continuous Infusions:  PRN Meds: acetaminophen, lidocaine, melatonin, mouth rinse, polyethylene glycol, Polyethyl Glycol-Propyl Glycol, prochlorperazine  Allergies:    Allergies  Allergen Reactions   Penicillins Hives   Sulfa Antibiotics Hives and Swelling   Gabapentin Other (See Comments)    Confusion   Lyrica [Pregabalin] Other (See Comments)    Confusion    Social History:   Social History   Socioeconomic History   Marital status: Single    Spouse name: Not on file   Number of children: Not on file   Years of education: Not on file   Highest education level: Not on file  Occupational History   Not on file  Tobacco Use   Smoking status: Never   Smokeless tobacco: Current    Types: Snuff   Tobacco comments:    03/08/2023 patient still use Snuff  Vaping Use   Vaping status: Never Used  Substance and Sexual  Activity   Alcohol use: No   Drug use: No   Sexual activity: Not on file  Other Topics Concern   Not on file  Social History Narrative   Not on file   Social Drivers of Health   Financial Resource Strain: Low Risk  (03/04/2023)   Overall Financial Resource Strain (CARDIA)    Difficulty of Paying Living Expenses: Not very hard  Food Insecurity: No Food Insecurity (04/09/2023)   Hunger Vital Sign    Worried About Running Out of Food in the Last Year: Never true    Ran Out of Food in the Last Year: Never true  Transportation Needs: No Transportation Needs (04/09/2023)   PRAPARE - Administrator, Civil Service (Medical): No    Lack of Transportation (Non-Medical): No  Physical Activity: Not on file  Stress: Not on file  Social Connections: Socially Isolated (04/09/2023)   Social  Connection and Isolation Panel [NHANES]    Frequency of Communication with Friends and Family: Twice a week    Frequency of Social Gatherings with Friends and Family: Once a week    Attends Religious Services: Never    Database administrator or Organizations: No    Attends Banker Meetings: Never    Marital Status: Widowed  Intimate Partner Violence: Not At Risk (04/09/2023)   Humiliation, Afraid, Rape, and Kick questionnaire    Fear of Current or Ex-Partner: No    Emotionally Abused: No    Physically Abused: No    Sexually Abused: No    Family History:   Family History  Problem Relation Age of Onset   CAD Mother    CAD Maternal Grandmother      ROS:  Please see the history of present illness. All other ROS reviewed and negative.     Physical Exam/Data:   Vitals:   04/10/23 0406 04/10/23 0418 04/10/23 0858 04/10/23 1129  BP: (!) 120/50  (!) 127/45 (!) 102/50  Pulse: 64  60 60  Resp: 17  18 18   Temp: 98 F (36.7 C)  98.7 F (37.1 C) 98 F (36.7 C)  TempSrc: Oral  Oral Oral  SpO2: 99%  97% 99%  Weight:  55.9 kg    Height:        Intake/Output Summary (Last 24  hours) at 04/10/2023 1314 Last data filed at 04/10/2023 0900 Gross per 24 hour  Intake 240 ml  Output --  Net 240 ml      04/10/2023    4:18 AM 04/09/2023    4:42 PM 04/09/2023    4:18 PM  Last 3 Weights  Weight (lbs) 123 lb 3.8 oz 126 lb 5.2 oz 125 lb  Weight (kg) 55.9 kg 57.3 kg 56.7 kg     Body mass index is 22.54 kg/m.  General:  Well nourished, well developed, in no acute distress HEENT: normal Neck: no JVD Vascular: No carotid bruits; Distal pulses 2+ bilaterally Cardiac:  normal S1, S2; RRR; no murmur  Lungs:  clear to auscultation bilaterally, no wheezing, rhonchi or rales  Abd: soft, nontender, no hepatomegaly  Ext: no edema Musculoskeletal:  No deformities, BUE and BLE strength normal and equal Skin: warm and dry  Neuro:  CNs 2-12 intact, no focal abnormalities noted Psych:  Normal affect   EKG:  The EKG was personally reviewed and demonstrates: No new EKGs.  V-paced though with left bundle branch block. Telemetry:  Telemetry was personally reviewed and demonstrates: V paced heart rates in the 60s  Relevant CV Studies: Echocardiogram 01/09/2023  1. Abnormal (paradoxical) septal motion, consistent with RV pacemaker.      . Left ventricular ejection fraction, by estimation, is 35 to 40%. The  left ventricle has moderately decreased function. The left ventricle  demonstrates global hypokinesis. There is mild left ventricular  hypertrophy. Left ventricular diastolic  parameters are indeterminate.   2. Right ventricular systolic function is normal. The right ventricular  size is normal. There is normal pulmonary artery systolic pressure.   3. Left atrial size was severely dilated.   4. Right atrial size was severely dilated.   5. The mitral valve is degenerative. Trivial mitral valve regurgitation.  No evidence of mitral stenosis.   6. The aortic valve is tricuspid. Aortic valve regurgitation is trivial.  No aortic stenosis is present.   7. Pulmonic valve  regurgitation is moderate.    Laboratory Data:  High Sensitivity  Troponin:   Recent Labs  Lab 04/06/23 1743 04/06/23 2043 04/08/23 1918 04/08/23 2051  TROPONINIHS 15 14 18* 15     Chemistry Recent Labs  Lab 04/07/23 0434 04/08/23 0222 04/08/23 1801 04/09/23 0630 04/10/23 0604  NA 134* 134* 133* 134* 135  K 3.8 3.5 4.2 3.7 3.5  CL 93* 98 95* 101 102  CO2 22 27 25 23 24   GLUCOSE 94 107* 115* 96 94  BUN 15 16 18 17 18   CREATININE 1.00 1.32* 1.58* 1.39* 1.22*  CALCIUM 12.0* 10.3 10.8* 10.5* 9.8  MG 1.8 1.7  --  1.8  --   GFRNONAA 52* 37* 30* 35* 41*  ANIONGAP 19* 9 13 10 9     Recent Labs  Lab 04/06/23 1743 04/07/23 0434 04/08/23 1918  PROT 5.6* 5.8* 6.5  ALBUMIN 3.2* 3.5 3.7  AST 26 27 28   ALT 22 21 25   ALKPHOS 61 63 66  BILITOT 0.8 1.3* 0.9   Lipids No results for input(s): "CHOL", "TRIG", "HDL", "LABVLDL", "LDLCALC", "CHOLHDL" in the last 168 hours.  Hematology Recent Labs  Lab 04/08/23 1801 04/09/23 0525 04/10/23 0604  WBC 6.4 5.4 4.3  RBC 4.07 4.16 3.83*  HGB 12.6 12.2 11.3*  HCT 38.0 37.3 34.2*  MCV 93.4 89.7 89.3  MCH 31.0 29.3 29.5  MCHC 33.2 32.7 33.0  RDW 14.2 14.2 14.0  PLT 205 257 195   Thyroid  Recent Labs  Lab 04/07/23 0434  TSH 1.211    BNP Recent Labs  Lab 04/06/23 1744  BNP 339.2*    DDimer No results for input(s): "DDIMER" in the last 168 hours.   Radiology/Studies:  CT Head Wo Contrast Result Date: 04/08/2023 CLINICAL DATA:  Headache, new onset (Age >= 51y) EXAM: CT HEAD WITHOUT CONTRAST TECHNIQUE: Contiguous axial images were obtained from the base of the skull through the vertex without intravenous contrast. RADIATION DOSE REDUCTION: This exam was performed according to the departmental dose-optimization program which includes automated exposure control, adjustment of the mA and/or kV according to patient size and/or use of iterative reconstruction technique. COMPARISON:  CT head 03/08/2023. FINDINGS: Motion limited  study.  Within this limitation: Brain: No evidence of acute infarction, hemorrhage, hydrocephalus, extra-axial collection or mass lesion/mass effect. Remote right cerebellar infarct. Remote left thalamic lacunar infarct. Patchy white matter hypodensities are nonspecific but compatible with chronic microvascular ischemic disease. Vascular: No hyperdense vessel.  Calcific atherosclerosis. Skull: No acute fracture. Sinuses/Orbits: Clear sinuses.  No acute orbital findings. Other: No mastoid effusions. IMPRESSION: 1. No evidence of acute intracranial abnormality on motion limited assessment. Stable at CT. 2. Remote infarcts and chronic microvascular ischemic disease. Electronically Signed   By: Feliberto Harts M.D.   On: 04/08/2023 21:06   CT ABDOMEN PELVIS WO CONTRAST Result Date: 04/08/2023 CLINICAL DATA:  Weakness, abdominal pain, discharge from hospital earlier today EXAM: CT ABDOMEN AND PELVIS WITHOUT CONTRAST TECHNIQUE: Multidetector CT imaging of the abdomen and pelvis was performed following the standard protocol without IV contrast. RADIATION DOSE REDUCTION: This exam was performed according to the departmental dose-optimization program which includes automated exposure control, adjustment of the mA and/or kV according to patient size and/or use of iterative reconstruction technique. COMPARISON:  03/08/2023 FINDINGS: Lower chest: The heart is enlarged without pericardial effusion. Stable pacemaker. No acute pleural or parenchymal lung disease. Hepatobiliary: Cholecystectomy. Unremarkable unenhanced appearance of the liver. Pancreas: Unremarkable unenhanced appearance. Spleen: Unremarkable unenhanced appearance. Adrenals/Urinary Tract: No urinary tract calculi or obstructive uropathy within either kidney. Stable bilateral renal cysts  do not require specific imaging follow-up. The adrenals and bladder are unremarkable. Stomach/Bowel: No bowel obstruction or ileus. Normal appendix right lower quadrant.  Diverticulosis of the descending and sigmoid colon without evidence of acute diverticulitis. No bowel wall thickening or inflammatory change. Vascular/Lymphatic: Aortic atherosclerosis. No enlarged abdominal or pelvic lymph nodes. Reproductive: Uterus and bilateral adnexa are unremarkable. Other: No free fluid or free intraperitoneal gas. No abdominal wall hernia. Musculoskeletal: Right hip arthroplasty. Diffuse osteopenia. No acute or destructive bony abnormalities. Reconstructed images demonstrate no additional findings. IMPRESSION: 1. No acute intra-abdominal or intrapelvic process. 2. Distal colonic diverticulosis without diverticulitis. 3. Cardiomegaly. 4.  Aortic Atherosclerosis (ICD10-I70.0). Electronically Signed   By: Sharlet Salina M.D.   On: 04/08/2023 21:06   DG Chest Portable 1 View Result Date: 04/06/2023 CLINICAL DATA:  Dyspnea, bilateral lower extremity swelling EXAM: PORTABLE CHEST 1 VIEW COMPARISON:  03/08/2023 FINDINGS: Single frontal view of the chest demonstrates postsurgical changes from CABG. Single lead pacer unchanged. Cardiac silhouette remains enlarged. No airspace disease, effusion, or pneumothorax. No acute bony abnormalities. IMPRESSION: 1. Stable enlarged cardiac silhouette. 2. No acute intrathoracic process. Electronically Signed   By: Sharlet Salina M.D.   On: 04/06/2023 18:04     Assessment and Plan:   Presyncope Ischemic cardiomyopathy with reduced EF 35-40% Admitted recently 2/10 for CHF exacerbation, sufficiently diuresed.  Discharged home and noted to have presyncopal/dizzy spell and returns back for admission.  Also noted to have a UTI here which is likely exacerbating everything.  Cardiology asked to see for med reconciliation. She is euvolemic. Patient is 89 years old and 56 kg and has recurrent history of orthostatic hypotension and high risk for subsequent events. Would continue to treat her UTI, avoid diuretics for today, gently PO hydrate, and slowly start  introducing back her GDMT. With her frailty/recurrent issues maybe need to be more lenient on BP goals. Additionally would continue to correct secondary causes of orthostatic hypotension like transitioning slowly, maintain adequate hydration, etc.  Avoid SGLT2 inhibitor with recurrent UTIs in the past PTA was on carvedilol 3.125 mg twice daily, Entresto 24-26 mg, spironolactone 25 mg. PTA Lasix 20 mg and 40 mg every other day dosing.  Likely will resume back on lasix 20mg  daily and titrate from there. Could consider cutting back spiro to 12.5mg  too.   CAD status post CABG 2006 No anginal complaints.  Negative troponins x 2 last admission.  Continue statin, carvedilol.  No aspirin with Eliquis.  Permanent atrial fibrillation with PPM V-paced since 2019.  Previously interrogated device normal function with no abnormalities noted.  Continue reduced dose of Eliquis 2.5 mg twice daily.   Risk Assessment/Risk Scores:    New York Heart Association (NYHA) Functional Class NYHA Class II  CHA2DS2-VASc Score = 6   This indicates a 9.7% annual risk of stroke. The patient's score is based upon: CHF History: 1 HTN History: 1 Diabetes History: 0 Stroke History: 0 Vascular Disease History: 1 Age Score: 2 Gender Score: 1     For questions or updates, please contact [facility] Please consult [email or web address] for contact info under    Signed, Abagail Kitchens, PA-C  04/10/2023 1:14 PM

## 2023-04-10 NOTE — Evaluation (Signed)
Occupational Therapy Evaluation Patient Details Name: ALLAN BACIGALUPI MRN: [id number] DOB: 07-Apr-1924 Today's Date: 04/10/2023   History of Present Illness   Pt is a 89 y.o. female recently admitted from 04/06/2023 until 04/08/2023 for acute on chronic HFrEF, who presents on the day of discharge with complaints of generalized weakness 45 minutes after she arrived at her home. Possible overdiuresis, presumptive UTI, euvolemic on exam. PMH: HTN, HLD, CAD s/p CABG, paroxysmal atrial fibrillation, hypothyroidism, CKD stage 3B, MI, syncope, chronic HFrEF, R THA     Clinical Impressions This 89 yo female admitted with above presents to acute OT with PLOF of being Mod I with all basic ADLs and A with IADLs and transportation from dtr (who lives in [place]). Currently she is overall at a S level due to recent fall and medical issues over the last 45 days that has had her at the hospital 5 times per her dtr. Due to this feel she could be encouraged to be up more in a safe manner and get stronger with a rehab stay as well as be watched more closely from a medical standpoint so recommended continued inpatient follow up therapy, <3 hours/day.      If plan is discharge home, recommend the following:   Assistance with cooking/housework;Direct supervision/assist for medications management;Direct supervision/assist for financial management     Functional Status Assessment   Patient has had a recent decline in their functional status and demonstrates the ability to make significant improvements in function in a reasonable and predictable amount of time.     Equipment Recommendations   None recommended by OT      Precautions/Restrictions   Precautions Precautions: Fall Restrictions Weight Bearing Restrictions Per Provider Order: No     Mobility Bed Mobility               General bed mobility comments: pt up in chair upon arrival    Transfers Overall transfer level: Needs  assistance Equipment used: Rollator (4 wheels), Rolling walker (2 wheels) Transfers: Sit to/from Stand Sit to Stand: Supervision                  Balance Overall balance assessment: Needs assistance Sitting-balance support: No upper extremity supported, Feet supported Sitting balance-Leahy Scale: Good     Standing balance support: No upper extremity supported, During functional activity Standing balance-Leahy Scale: Fair Standing balance comment: able to stand at sink and do grooming tasks without LOB                           ADL either performed or assessed with clinical judgement   ADL Overall ADL's : Needs assistance/impaired Eating/Feeding: Independent;Sitting   Grooming: Supervision/safety;Standing   Upper Body Bathing: Supervision/ safety;Standing   Lower Body Bathing: Supervison/ safety;Sit to/from stand   Upper Body Dressing : Supervision/safety;Sitting   Lower Body Dressing: Supervision/safety;Sit to/from stand   Toilet Transfer: Supervision/safety;Ambulation;Rollator (4 wheels) Toilet Transfer Details (indicate cue type and reason): simulated stand from chair>ambulate out in hallway with RW/changing to rollator> back to room to stand at sink to wash face and brush teeth>around bed to sit in chair Toileting- Clothing Manipulation and Hygiene: Supervision/safety;Sit to/from stand         General ADL Comments: I would say overall at a S level due to a recent fall and multiple medical issues with 5 admissions per dtr in the last 45 days.     Vision Baseline Vision/History: 0 No visual deficits  Pertinent Vitals/Pain Pain Assessment Pain Assessment: Faces Faces Pain Scale: Hurts even more Pain Location: left knee (pt feels it's due to lidocaine patch that bothered her, dtr thinks its her sciatica making her knee hurt because this is usually where it does hurt) Pain Descriptors / Indicators: Aching, Sore Pain Intervention(s):  Limited activity within patient's tolerance     Extremity/Trunk Assessment Upper Extremity Assessment Upper Extremity Assessment: Overall WFL for tasks assessed           Communication Communication Communication: No apparent difficulties   Cognition Arousal: Alert Behavior During Therapy: WFL for tasks assessed/performed               OT - Cognition Comments: Decreased safety awareness with use of RW with locking and unlocking breaks                 Following commands: Intact       Cueing   Cueing Techniques: Verbal cues              Home Living Family/patient expects to be discharged to:: Private residence Living Arrangements: Alone Available Help at Discharge: Family;Available PRN/intermittently Type of Home: Apartment Home Access: Elevator     Home Layout: One level     Bathroom Shower/Tub: Producer, television/film/video: Handicapped height     Home Equipment: Grab bars - tub/shower;Grab bars - toilet;Shower seat;Cane - single point;Rollator (4 wheels);Cane - Programmer, applications (2 wheels);Adaptive equipment Adaptive Equipment: Reacher;Long-handled shoe horn Additional Comments: walk in shower, normal height toilet; dtr lived in [place] and calls pt every day and comes to see her 2-3 times a week      Prior Functioning/Environment Prior Level of Function : Needs assist             Mobility Comments: rollator for mobility ADLs Comments: daughter brings groceries, no longer drives, assist for washing clothes    OT Problem List: Impaired balance (sitting and/or standing)   OT Treatment/Interventions: Self-care/ADL training;Balance training;Patient/family education;DME and/or AE instruction      OT Goals(Current goals can be found in the care plan section)   Acute Rehab OT Goals Patient Stated Goal: to get out of her today, but not totally against rehab first OT Goal Formulation: With patient/family Time For Goal Achievement:  04/24/23 Potential to Achieve Goals: Fair   OT Frequency:  Min 1X/week       AM-PAC OT "6 Clicks" Daily Activity     Outcome Measure Help from another person eating meals?: None Help from another person taking care of personal grooming?: A Little Help from another person toileting, which includes using toliet, bedpan, or urinal?: A Little Help from another person bathing (including washing, rinsing, drying)?: A Little Help from another person to put on and taking off regular upper body clothing?: A Little Help from another person to put on and taking off regular lower body clothing?: A Little 6 Click Score: 19   End of Session Equipment Utilized During Treatment: Gait belt;Rolling walker (2 wheels);Rollator (4 wheels) Nurse Communication: Mobility status  Activity Tolerance: Patient tolerated treatment well Patient left: in chair;with call bell/phone within reach;with chair alarm set (getting ready to work with PT)  OT Visit Diagnosis: Unsteadiness on feet (R26.81);History of falling (Z91.81)                Time: 1010-1025 OT Time Calculation (min): 15 min Charges:  OT General Charges $OT Visit: 1 Visit OT Evaluation $OT Eval Moderate Complexity: 1 Mod  Lindon Romp OT Acute Rehabilitation Services Office [phone]    Evette Georges 04/10/2023, 11:18 AM

## 2023-04-10 NOTE — [place] FL2 (Signed)
[place] MEDICAID FL2 LEVEL OF CARE FORM     IDENTIFICATION  Patient Name: Stephanie Atkinson Birthdate: 07/31/1924 Sex: female Admission Date (Current Location): 04/08/2023  [facility] and [place] Number:  Producer, television/film/video and Address:  [place], [address], [place], [place] [address]      Provider Number: [id number]  Attending Physician Name and Address:  Rodolph Bong, MD  Relative Name and Phone Number:  Rashid,Judy Daughter [phone]    Current Level of Care: Hospital Recommended Level of Care: Skilled Nursing Facility Prior Approval Number:    Date Approved/Denied:   PASRR Number: [id number]  Discharge Plan: SNF    Current Diagnoses: Patient Active Problem List   Diagnosis Date Noted   Generalized weakness 04/08/2023   Acute hyponatremia 04/07/2023   Acute anemia 04/07/2023   Acute on chronic systolic (congestive) heart failure (HCC) 04/06/2023   AMS (altered mental status) 03/08/2023   Hypotension 03/08/2023   Sepsis (HCC) 03/08/2023   UTI (urinary tract infection) 03/08/2023   CKD stage 3b, GFR 30-44 ml/min (HCC) 01/09/2023   Near syncope 01/08/2023   Elevated troponin 01/08/2023   Hypercalcemia 01/08/2023   Acquired hypothyroidism 01/08/2023   Low back pain 06/14/2022   CHF (congestive heart failure) (HCC) 12/11/2021   Acute on chronic systolic CHF (congestive heart failure) (HCC) 12/09/2021   Venous stasis dermatitis of both lower extremities 01/06/2020   Unilateral primary osteoarthritis, right knee 01/06/2020   Community acquired bacterial pneumonia 06/23/2019   AKI (acute kidney injury) (HCC) 06/23/2019   Synovitis of right knee 03/02/2019   Chronic diastolic heart failure (HCC) 09/14/2018   Pacemaker 07/16/2017   Paroxysmal atrial fibrillation (HCC) 06/27/2017   HLD (hyperlipidemia) 06/27/2017   Symptomatic bradycardia s/p PPM  06/27/2017   Chronic pain of right knee 05/17/2016   De Quervain's tenosynovitis  05/17/2016   CAD (coronary artery disease) of artery bypass graft 02/13/2015   Encounter for loop recorder check 02/01/2014   Accelerated junctional rhythm 05/04/2013   Dehydration 10/14/2012   Syncope and collapse 10/13/2012   History of first degree atrioventricular block 10/11/2012   Long term (current) use of anticoagulants 10/06/2012   AVB - beta blocker and Amiodarone stopped 07/10/2012   Essential hypertension    Hx of CABG X 4 3/06-     Dyslipidemia    RCA DES placed 7/12- patent 8/14    Permanent atrial fibrillation (HCC)    Acute kidney injury superimposed on chronic kidney disease (HCC)     Orientation RESPIRATION BLADDER Height & Weight     Self, Time, Situation, Place  Normal Continent Weight: 55.9 kg Height:  5\' 2"  (157.5 cm)  BEHAVIORAL SYMPTOMS/MOOD NEUROLOGICAL BOWEL NUTRITION STATUS      Continent Diet (heart healthy with thin liquids)  AMBULATORY STATUS COMMUNICATION OF NEEDS Skin   Limited Assist Verbally Normal                       Personal Care Assistance Level of Assistance  Feeding, Dressing, Bathing Bathing Assistance: Limited assistance Feeding assistance: Independent Dressing Assistance: Limited assistance     Functional Limitations Info  Sight, Hearing, Speech Sight Info: Adequate Hearing Info: Adequate Speech Info: Adequate    SPECIAL CARE FACTORS FREQUENCY  PT (By licensed PT), OT (By licensed OT)     PT Frequency: 5x/wk OT Frequency: 5x/wk            Contractures Contractures Info: Not present    Additional  Factors Info  Code Status, Allergies Code Status Info: DNR Allergies Info: Penicillin/ Sulfa Antibiotics/ Gabapentin/ Lyrica           Current Medications (04/10/2023):  This is the current hospital active medication list Current Facility-Administered Medications  Medication Dose Route Frequency Provider Last Rate Last Admin   acetaminophen (TYLENOL) tablet 500 mg  500 mg Oral TID Rodolph Bong, MD   500 mg  at 04/10/23 1010   acetaminophen (TYLENOL) tablet 650 mg  650 mg Oral Q6H PRN Darlin Drop, DO   650 mg at 04/09/23 0747   apixaban (ELIQUIS) tablet 2.5 mg  2.5 mg Oral BID Dow Adolph N, DO   2.5 mg at 04/10/23 1010   atorvastatin (LIPITOR) tablet 80 mg  80 mg Oral QHS Darlin Drop, DO   80 mg at 04/09/23 2141   feeding supplement (ENSURE ENLIVE / ENSURE PLUS) liquid 237 mL  237 mL Oral BID BM Rodolph Bong, MD   237 mL at 04/10/23 1013   levothyroxine (SYNTHROID) tablet 50 mcg  50 mcg Oral QAC breakfast Dow Adolph N, DO   50 mcg at 04/10/23 0731   lidocaine (LIDODERM) 5 % 1 patch  1 patch Transdermal Daily PRN Dow Adolph N, DO       melatonin tablet 5 mg  5 mg Oral QHS PRN Dow Adolph N, DO   5 mg at 04/09/23 2141   Oral care mouth rinse  15 mL Mouth Rinse PRN Rodolph Bong, MD       phosphorus (K PHOS NEUTRAL) tablet 250 mg  250 mg Oral BID Rodolph Bong, MD   250 mg at 04/10/23 1010   polyethylene glycol (MIRALAX / GLYCOLAX) packet 17 g  17 g Oral Daily PRN Dow Adolph N, DO   17 g at 04/09/23 1242   polyethylene glycol 0.4% and propylene glycol 0.3% (SYSTANE) ophthalmic gel   Both Eyes Daily PRN Rodolph Bong, MD       prochlorperazine (COMPAZINE) injection 5 mg  5 mg Intravenous Q6H PRN Darlin Drop, DO         Discharge Medications: Please see discharge summary for a list of discharge medications.  Relevant Imaging Results:  Relevant Lab Results:   Additional Information SS#: [id number]  Kermit Balo, RN

## 2023-04-11 ENCOUNTER — Ambulatory Visit: Payer: Medicare PPO | Admitting: Physician Assistant

## 2023-04-11 DIAGNOSIS — E785 Hyperlipidemia, unspecified: Secondary | ICD-10-CM | POA: Diagnosis not present

## 2023-04-11 DIAGNOSIS — R531 Weakness: Secondary | ICD-10-CM | POA: Diagnosis not present

## 2023-04-11 DIAGNOSIS — I2510 Atherosclerotic heart disease of native coronary artery without angina pectoris: Secondary | ICD-10-CM | POA: Diagnosis not present

## 2023-04-11 DIAGNOSIS — I25810 Atherosclerosis of coronary artery bypass graft(s) without angina pectoris: Secondary | ICD-10-CM | POA: Diagnosis not present

## 2023-04-11 DIAGNOSIS — I10 Essential (primary) hypertension: Secondary | ICD-10-CM | POA: Diagnosis not present

## 2023-04-11 DIAGNOSIS — N3000 Acute cystitis without hematuria: Secondary | ICD-10-CM | POA: Diagnosis not present

## 2023-04-11 DIAGNOSIS — R55 Syncope and collapse: Secondary | ICD-10-CM | POA: Diagnosis not present

## 2023-04-11 DIAGNOSIS — I130 Hypertensive heart and chronic kidney disease with heart failure and stage 1 through stage 4 chronic kidney disease, or unspecified chronic kidney disease: Secondary | ICD-10-CM | POA: Diagnosis not present

## 2023-04-11 DIAGNOSIS — I5023 Acute on chronic systolic (congestive) heart failure: Secondary | ICD-10-CM | POA: Diagnosis not present

## 2023-04-11 DIAGNOSIS — E039 Hypothyroidism, unspecified: Secondary | ICD-10-CM | POA: Diagnosis not present

## 2023-04-11 DIAGNOSIS — Z950 Presence of cardiac pacemaker: Secondary | ICD-10-CM | POA: Diagnosis not present

## 2023-04-11 DIAGNOSIS — E860 Dehydration: Secondary | ICD-10-CM | POA: Diagnosis not present

## 2023-04-11 DIAGNOSIS — N1832 Chronic kidney disease, stage 3b: Secondary | ICD-10-CM | POA: Diagnosis not present

## 2023-04-11 DIAGNOSIS — I5032 Chronic diastolic (congestive) heart failure: Secondary | ICD-10-CM | POA: Diagnosis not present

## 2023-04-11 DIAGNOSIS — E034 Atrophy of thyroid (acquired): Secondary | ICD-10-CM | POA: Diagnosis not present

## 2023-04-11 LAB — BASIC METABOLIC PANEL
Anion gap: 10 (ref 5–15)
BUN: 18 mg/dL (ref 8–23)
CO2: 23 mmol/L (ref 22–32)
Calcium: 10 mg/dL (ref 8.9–10.3)
Chloride: 99 mmol/L (ref 98–111)
Creatinine, Ser: 1.12 mg/dL — ABNORMAL HIGH (ref 0.44–1.00)
GFR, Estimated: 45 mL/min — ABNORMAL LOW (ref >60)
Glucose, Bld: 132 mg/dL — ABNORMAL HIGH (ref 70–99)
Potassium: 3.7 mmol/L (ref 3.5–5.1)
Sodium: 132 mmol/L — ABNORMAL LOW (ref 135–145)

## 2023-04-11 LAB — PHOSPHORUS: Phosphorus: 2.9 mg/dL (ref 2.5–4.6)

## 2023-04-11 LAB — MAGNESIUM: Magnesium: 1.8 mg/dL (ref 1.7–2.4)

## 2023-04-11 MED ORDER — LOPERAMIDE HCL 2 MG PO CAPS: 2.0000 mg | ORAL_CAPSULE | ORAL | Status: DC | PRN

## 2023-04-11 MED ORDER — ACETAMINOPHEN 10 MG/ML IV SOLN
1000.0000 mg | Freq: Once | INTRAVENOUS | Status: AC
  Administered 2023-04-11: 1000 mg via INTRAVENOUS
  Filled 2023-04-11: qty 100

## 2023-04-11 MED ORDER — LOPERAMIDE HCL 2 MG PO CAPS
2.0000 mg | ORAL_CAPSULE | Freq: Once | ORAL | Status: AC
Start: 2023-04-11 — End: 2023-04-11
  Administered 2023-04-11: 2 mg via ORAL
  Filled 2023-04-11: qty 1

## 2023-04-11 NOTE — Plan of Care (Signed)
  Problem: Health Behavior/Discharge Planning: Goal: Ability to manage health-related needs will improve Outcome: Progressing   Problem: Clinical Measurements: Goal: Will remain free from infection Outcome: Progressing   Problem: Activity: Goal: Risk for activity intolerance will decrease Outcome: Progressing   Problem: Nutrition: Goal: Adequate nutrition will be maintained Outcome: Progressing   Problem: Pain Managment: Goal: General experience of comfort will improve and/or be controlled Outcome: Progressing   Problem: Safety: Goal: Ability to remain free from injury will improve Outcome: Progressing

## 2023-04-11 NOTE — Progress Notes (Signed)
Rounding Note    Patient Name: Stephanie Atkinson Date of Encounter: 04/11/2023  [facility] Cardiologist: Nanetta Batty, MD   Subjective   No acute overnight events. She states she had a very brief episode of dizziness after eating lunch when she was sitting up but it only lasted a couple of seconds. She is currently laying down flat on her side and feels fine. No chest pain pain or shortness of breath.  Inpatient Medications    Scheduled Meds:  acetaminophen  500 mg Oral TID   apixaban  2.5 mg Oral BID   atorvastatin  80 mg Oral QHS   feeding supplement  237 mL Oral BID BM   levothyroxine  50 mcg Oral QAC breakfast   phosphorus  250 mg Oral BID   Continuous Infusions:  PRN Meds: acetaminophen, lidocaine, melatonin, mouth rinse, polyethylene glycol, Polyethyl Glycol-Propyl Glycol, prochlorperazine   Vital Signs    Vitals:   04/11/23 0437 04/11/23 0500 04/11/23 0915 04/11/23 1243  BP: 137/64  136/68 124/60  Pulse: 60   60  Resp: 18  17 16   Temp: 98.4 F (36.9 C)  98.3 F (36.8 C) 97.7 F (36.5 C)  TempSrc: Oral  Oral Oral  SpO2: 99%  98% 100%  Weight:  59.4 kg    Height:        Intake/Output Summary (Last 24 hours) at 04/11/2023 1401 Last data filed at 04/11/2023 0705 Gross per 24 hour  Intake 463 ml  Output --  Net 463 ml      04/11/2023    5:00 AM 04/10/2023    4:18 AM 04/09/2023    4:42 PM  Last 3 Weights  Weight (lbs) 130 lb 15.3 oz 123 lb 3.8 oz 126 lb 5.2 oz  Weight (kg) 59.4 kg 55.9 kg 57.3 kg      Telemetry    V-paced with rates in the 60s to 70s. - Personally Reviewed  ECG    No new ECG tracing today. - Personally Reviewed  Physical Exam   GEN: Thin elderly Caucasian female resting comfortably in no acute distress.   Neck: No JVD. Cardiac: RRR. No murmurs, rubs, or gallops.  Respiratory: Clear to auscultation bilaterally. No wheezes, rhonchi, or rales. MS: No lower extremity edema. No deformity. Skin: Warm and dry. Neuro:  No  focal deficits. Psych: Normal affect. Responds appropriately.  Labs    High Sensitivity Troponin:   Recent Labs  Lab 04/06/23 1743 04/06/23 2043 04/08/23 1918 04/08/23 2051  TROPONINIHS 15 14 18* 15     Chemistry Recent Labs  Lab 04/06/23 1743 04/06/23 2043 04/07/23 0434 04/08/23 0222 04/08/23 1801 04/08/23 1918 04/09/23 0630 04/10/23 0604 04/11/23 0751  NA 128*  --  134* 134*   < >  --  134* 135 132*  K 4.2  --  3.8 3.5   < >  --  3.7 3.5 3.7  CL 99  --  93* 98   < >  --  101 102 99  CO2 22  --  22 27   < >  --  23 24 23   GLUCOSE 91  --  94 107*   < >  --  96 94 132*  BUN 16  --  15 16   < >  --  17 18 18   CREATININE 1.23*  --  1.00 1.32*   < >  --  1.39* 1.22* 1.12*  CALCIUM 10.3  --  12.0* 10.3   < >  --  10.5* 9.8 10.0  MG  --    < > 1.8 1.7  --   --  1.8  --  1.8  PROT 5.6*  --  5.8*  --   --  6.5  --   --   --   ALBUMIN 3.2*  --  3.5  --   --  3.7  --   --   --   AST 26  --  27  --   --  28  --   --   --   ALT 22  --  21  --   --  25  --   --   --   ALKPHOS 61  --  63  --   --  66  --   --   --   BILITOT 0.8  --  1.3*  --   --  0.9  --   --   --   GFRNONAA 40*  --  52* 37*   < >  --  35* 41* 45*  ANIONGAP 7  --  19* 9   < >  --  10 9 10    < > = values in this interval not displayed.    Lipids No results for input(s): "CHOL", "TRIG", "HDL", "LABVLDL", "LDLCALC", "CHOLHDL" in the last 168 hours.  Hematology Recent Labs  Lab 04/08/23 1801 04/09/23 0525 04/10/23 0604  WBC 6.4 5.4 4.3  RBC 4.07 4.16 3.83*  HGB 12.6 12.2 11.3*  HCT 38.0 37.3 34.2*  MCV 93.4 89.7 89.3  MCH 31.0 29.3 29.5  MCHC 33.2 32.7 33.0  RDW 14.2 14.2 14.0  PLT 205 257 195   Thyroid  Recent Labs  Lab 04/07/23 0434  TSH 1.211    BNP Recent Labs  Lab 04/06/23 1744  BNP 339.2*    DDimer No results for input(s): "DDIMER" in the last 168 hours.   Radiology    No results found.  Cardiac Studies   Echocardiogram 01/09/2023: Impressions: 1. Abnormal (paradoxical) septal  motion, consistent with RV pacemaker.  Left ventricular ejection fraction, by estimation, is 35 to 40%. The  left ventricle has moderately decreased function. The left ventricle  demonstrates global hypokinesis. There is mild left ventricular  hypertrophy. Left ventricular diastolic  parameters are indeterminate.   2. Right ventricular systolic function is normal. The right ventricular  size is normal. There is normal pulmonary artery systolic pressure.   3. Left atrial size was severely dilated.   4. Right atrial size was severely dilated.   5. The mitral valve is degenerative. Trivial mitral valve regurgitation.  No evidence of mitral stenosis.   6. The aortic valve is tricuspid. Aortic valve regurgitation is trivial.  No aortic stenosis is present.   7. Pulmonic valve regurgitation is moderate.    Patient Profile     89 y.o. female with a history of CAD s/p remote CABG x4 in 2006 and subsequent PCI with DES to PDA in 2012, ischemic cardiomyopathy/ chronic HFrEF with EF of 35-40% on Echo in 12/2022, permanent atrial fibrillation on Eliquis, symptomatic bradycardia s/p PPM in 2019, syncope, hypertension complicated by orthostatic hypotension, hyperlipidemia, CKD stage III, and hypothyroidism who was recently admitted for acute CHF exacerbation. She was discharged on 04/08/2023 and returned to the ED later that day for further evaluation of dizziness/ pre-syncope. Cardiology was consulted to assist with medications.  Assessment & Plan    Chronic HFrEF Ischemic Cardiomyopathy Presyncope History of Orthostatic Hypotension Patient has a history  of ischemic cardiomyopathy with known EF of 35-40% on last Echo in 12/2022. She was recently admitted for acute CHF exacerbation earlier this week and was discharged on 2/11. She presented back a few hours after discharge with dizziness and presyncope. She was found to have a UTI. However, she also has a history of orthostatic hypotension in the past,  so Lasix and GDMT were held due to concern she may be a little dry. - Euvolemic on exam. She states she had a very brief episode of dizziness this afternoon while sitting up after lunch but it only lasted a couple of seconds. - BP soft at times but stable. - Will check orthostatic vital signs. If these are negative, we can add back Lasix and GDMT.   - Home medications include: Lasix alternate between 20mg  and 40mg  every other day, Entresto 24-26mg  twice daily, Coreg 3.125mg  twice daily, Spironolactone 25mg  daily. May need to consider dropping Lasix to 20mg  daily or Spironolactone to 12.5mg  daily. - No SGTL2 inhibitor due to recurrent UTIs. - Continue to monitor volume status closely.   CAD s/p CABG S/p CABG in 2006 and subsequent PCI in 2012.  - No chest pain. - No aspirin given need for full anticoagulation. - Continue statins.  Permanent Atrial Fibrillation Symptomatic Bradycardia s/p PPM S/p PPM in 2019.  - Telemetry shows V-paced rhythm. - Continue Eliquis 2.5mg  twice daily. Reduced dose due to age and weight.  Hyperlipidemia - Continue Lipitor 80mg  daily.  CKD Stage III Creatinine stable at 1.12 today.  Otherwise, per primary team: - UTI - Generalized weakness - Hypothyroidism   For questions or updates, please contact [facility] Please consult [email or web address] for contact info under        Signed, Corrin Parker, PA-C  04/11/2023, 2:01 PM

## 2023-04-11 NOTE — TOC Progression Note (Signed)
Transition of Care [facility]) - Progression Note    Patient Details  Name: LYNZEE LINDQUIST MRN: [id number] Date of Birth: [birth year]  Transition of [facility]) CM/SW Contact  Kermit Balo, RN Phone Number: 04/11/2023, 2:08 PM  Clinical Narrative:     Per MD not medically ready. Still waiting on auth from [facility]. TOC following.  Expected Discharge Plan: Skilled Nursing Facility Barriers to Discharge: Continued Medical Work up  Expected Discharge Plan and Services   Discharge Planning Services: CM Consult   Living arrangements for the past 2 months: Apartment                                       Social Determinants of Health (SDOH) Interventions SDOH Screenings   Food Insecurity: No Food Insecurity (04/09/2023)  Housing: Low Risk  (04/09/2023)  Transportation Needs: No Transportation Needs (04/09/2023)  Utilities: Not At Risk (04/09/2023)  Financial Resource Strain: Low Risk  (03/04/2023)  Social Connections: Socially Isolated (04/09/2023)  Tobacco Use: High Risk (04/09/2023)    Readmission Risk Interventions    03/11/2023   10:35 AM  Readmission Risk Prevention Plan  Transportation Screening Complete  PCP or Specialist Appt within 5-7 Days Complete  Home Care Screening Complete  Medication Review (RN CM) Complete

## 2023-04-11 NOTE — Progress Notes (Signed)
PROGRESS NOTE    Stephanie Atkinson  HYQ:[id number] DOB: 09/22/1924 DOA: 04/08/2023 PCP: Linus Galas, NP    Chief Complaint  Patient presents with   Weakness    Brief Narrative:  Patient 89 year old female history of chronic HF R EF, CKD 3B, CAD status post CABG in 2006, hypertension, hyperlipidemia, hypothyroidism, paroxysmal A-fib on Eliquis recently admitted from 04/06/2023-04/08/2023 for acute on chronic HFrEF presenting on day of discharge with complaints of generalized weakness 45 minutes after she arrived at home.  Daughter describes episode which seems more presyncope nature.  Patient subsequently brought to the ED.  On presentation to the ED patient noted to have soft blood pressures, afebrile, no leukocytosis, urinalysis consistent with a UTI.  Patient placed on antibiotics.  Antihypertensive medications held.     Assessment & Plan:   Principal Problem:   Generalized weakness Active Problems:   Near syncope   Symptomatic bradycardia s/p PPM    CKD stage 3b, GFR 30-44 ml/min (HCC)   Acquired hypothyroidism   Essential hypertension   Dyslipidemia   CAD (coronary artery disease) of artery bypass graft   Dehydration   Paroxysmal atrial fibrillation (HCC)   HLD (hyperlipidemia)   Chronic diastolic heart failure (HCC)   UTI (urinary tract infection)  #1 generalized weakness/near syncope -Likely multifactorial secondary to overdiuresis during recent hospitalization in the setting of presumptive UTI, POA. -Patient noted to have soft blood pressure on admission. -Patient's home diuretics and antihypertensive medications currently on hold. -Patient with clinical improvement with systolic blood pressure slowly improving.   -Urine cultures negative.   -Status post fosfomycin.   -Clinical improvement.   -PT/OT.   -Fall precautions.    2.  Presumptive UTI, POA -Urine cultures negative.  -Patient afebrile. -Status post fosfomycin. -Supportive care. -No further antibiotics  needed.  3.  Chronic HFrEF -Euvolemic on examination. -Continue to hold home regimen of diuretics due to soft blood pressure earlier in the hospitalization which has improved. -Patient noted to have presented with a presyncopal episode. -Continue to hold Coreg, Entresto, Lasix. -Cardiology consulted to help with medication management and will defer resumption of medications to cardiology..  4.  Paroxysmal A-fib on Eliquis -Currently rate controlled.   -Continue Eliquis for anticoagulation.    5.  Hypothyroidism -Synthroid.   6.  CKD 3B -Stable. -Close to baseline.  7.  CAD status post CABG -Continue home regimen Lipitor and Eliquis. -Continue to hold Coreg, continue to hold diuretics and Entresto and spironolactone. -Will defer to cardiology when to resume cardiac medications and at ? reduced doses. -Per cardiology    DVT prophylaxis: Eliquis Code Status: DNR Family Communication: Updated patient and daughter at bedside. Disposition: Patient should be medically stable for discharge to SNF on 04/12/2023.   Status is: Observation The patient remains OBS appropriate and will d/c before 2 midnights.   Consultants:  Cardiology: Dr Jacques Navy 04/10/2023  Procedures:  CT head 04/08/2023 CT abdomen pelvis 04/08/2023  Antimicrobials:  Anti-infectives (From admission, onward)    Start     Dose/Rate Route Frequency Ordered Stop   04/09/23 0915  cefTRIAXone (ROCEPHIN) 2 g in sodium chloride 0.9 % 100 mL IVPB  Status:  Discontinued        2 g 200 mL/hr over 30 Minutes Intravenous Every 24 hours 04/09/23 0909 04/09/23 0933   04/08/23 2245  fosfomycin (MONUROL) packet 3 g        3 g Oral  Once 04/08/23 2232 04/09/23 0035  Subjective: Sitting up at the side of the bed.  Denies any chest pain or shortness of breath.  No lightheadedness or dizziness.  States she feels well.  Daughter at bedside.   Objective: Vitals:   04/11/23 0500 04/11/23 0915 04/11/23 1243 04/11/23 1558   BP:  136/68 124/60 (!) 147/58  Pulse:   60 64  Resp:  17 16 16   Temp:  98.3 F (36.8 C) 97.7 F (36.5 C) 97.9 F (36.6 C)  TempSrc:  Oral Oral Oral  SpO2:  98% 100% 99%  Weight: 59.4 kg     Height:        Intake/Output Summary (Last 24 hours) at 04/11/2023 1712 Last data filed at 04/11/2023 0705 Gross per 24 hour  Intake 463 ml  Output --  Net 463 ml   Filed Weights   04/09/23 1642 04/10/23 0418 04/11/23 0500  Weight: 57.3 kg 55.9 kg 59.4 kg    Examination:  General exam: NAD Respiratory system: CTAB.  No wheezes, no crackles, no rhonchi.  Fair air movement.  Cardiovascular system: Regular rate rhythm no murmurs rubs or gallop.  No JVD.  No pitting lower extremity edema.  Gastrointestinal system: Abdomen is soft, nontender, nondistended, positive bowel sounds.  No rebound.  No guarding.  Central nervous system: Alert and oriented. No focal neurological deficits. Extremities: Symmetric 5 x 5 power. Skin: No rashes, lesions or ulcers Psychiatry: Judgement and insight appear normal. Mood & affect appropriate.     Data Reviewed: I have personally reviewed following labs and imaging studies  CBC: Recent Labs  Lab 04/06/23 1743 04/07/23 0434 04/08/23 1801 04/09/23 0525 04/10/23 0604  WBC 4.1 4.2 6.4 5.4 4.3  NEUTROABS 2.3 2.7  --   --   --   HGB 10.9* 12.1 12.6 12.2 11.3*  HCT 34.4* 36.5 38.0 37.3 34.2*  MCV 91.5 89.0 93.4 89.7 89.3  PLT 174 199 205 257 195    Basic Metabolic Panel: Recent Labs  Lab 04/06/23 2043 04/07/23 0434 04/08/23 0222 04/08/23 1801 04/09/23 0630 04/10/23 0604 04/11/23 0751  NA  --  134* 134* 133* 134* 135 132*  K  --  3.8 3.5 4.2 3.7 3.5 3.7  CL  --  93* 98 95* 101 102 99  CO2  --  22 27 25 23 24 23   GLUCOSE  --  94 107* 115* 96 94 132*  BUN  --  15 16 18 17 18 18   CREATININE  --  1.00 1.32* 1.58* 1.39* 1.22* 1.12*  CALCIUM  --  12.0* 10.3 10.8* 10.5* 9.8 10.0  MG 1.9 1.8 1.7  --  1.8  --  1.8  PHOS  --  2.6  --   --  2.4*   --  2.9    GFR: Estimated Creatinine Clearance: 23.2 mL/min (A) (by C-G formula based on SCr of 1.12 mg/dL (H)).  Liver Function Tests: Recent Labs  Lab 04/06/23 1743 04/07/23 0434 04/08/23 1918  AST 26 27 28   ALT 22 21 25   ALKPHOS 61 63 66  BILITOT 0.8 1.3* 0.9  PROT 5.6* 5.8* 6.5  ALBUMIN 3.2* 3.5 3.7    CBG: No results for input(s): "GLUCAP" in the last 168 hours.   Recent Results (from the past 240 hours)  MRSA Next Gen by PCR, Nasal     Status: None   Collection Time: 04/07/23  3:07 PM   Specimen: Nasal Mucosa; Nasal Swab  Result Value Ref Range Status   MRSA by PCR Next Gen  NOT DETECTED NOT DETECTED Final    Comment: (NOTE) The GeneXpert MRSA Assay (FDA approved for NASAL specimens only), is one component of a comprehensive MRSA colonization surveillance program. It is not intended to diagnose MRSA infection nor to guide or monitor treatment for MRSA infections. Test performance is not FDA approved in patients less than 31 years old. Performed at [facility], [address], [place], [place] [address]   Urine Culture     Status: None   Collection Time: 04/09/23  7:27 AM   Specimen: Urine, Random  Result Value Ref Range Status   Specimen Description URINE, RANDOM  Final   Special Requests NONE Reflexed from [id number]  Final   Culture   Final    NO GROWTH Performed at [facility], [address], [place], [place] [address]    Report Status 04/10/2023 FINAL  Final         Radiology Studies: No results found.       Scheduled Meds:  acetaminophen  500 mg Oral TID   apixaban  2.5 mg Oral BID   atorvastatin  80 mg Oral QHS   feeding supplement  237 mL Oral BID BM   levothyroxine  50 mcg Oral QAC breakfast   loperamide  2 mg Oral Once   phosphorus  250 mg Oral BID   Continuous Infusions:   LOS: 0 days    Time spent: 35 minutes    Ramiro Harvest, MD [facility]   To contact the attending provider between 7A-7P or  the covering provider during after hours 7P-7A, please log into the web site [email or web address] and access using universal [place] password for that web site. If you do not have the password, please call the hospital operator.  04/11/2023, 5:12 PM

## 2023-04-11 NOTE — Plan of Care (Signed)
  Problem: Education: Goal: Knowledge of General Education information will improve Description: Including pain rating scale, medication(s)/side effects and non-pharmacologic comfort measures Outcome: Progressing   Problem: Clinical Measurements: Goal: Respiratory complications will improve Outcome: Progressing Goal: Cardiovascular complication will be avoided Outcome: Progressing   Problem: Activity: Goal: Risk for activity intolerance will decrease Outcome: Progressing   Problem: Nutrition: Goal: Adequate nutrition will be maintained Outcome: Progressing   Problem: Coping: Goal: Level of anxiety will decrease Outcome: Progressing   Problem: Elimination: Goal: Will not experience complications related to urinary retention Outcome: Progressing   Problem: Skin Integrity: Goal: Risk for impaired skin integrity will decrease Outcome: Progressing

## 2023-04-12 DIAGNOSIS — I10 Essential (primary) hypertension: Secondary | ICD-10-CM | POA: Diagnosis not present

## 2023-04-12 DIAGNOSIS — N1832 Chronic kidney disease, stage 3b: Secondary | ICD-10-CM | POA: Diagnosis not present

## 2023-04-12 DIAGNOSIS — N3000 Acute cystitis without hematuria: Secondary | ICD-10-CM | POA: Diagnosis not present

## 2023-04-12 DIAGNOSIS — E039 Hypothyroidism, unspecified: Secondary | ICD-10-CM | POA: Diagnosis not present

## 2023-04-12 DIAGNOSIS — I5032 Chronic diastolic (congestive) heart failure: Secondary | ICD-10-CM | POA: Diagnosis not present

## 2023-04-12 DIAGNOSIS — E860 Dehydration: Secondary | ICD-10-CM

## 2023-04-12 DIAGNOSIS — I25810 Atherosclerosis of coronary artery bypass graft(s) without angina pectoris: Secondary | ICD-10-CM | POA: Diagnosis not present

## 2023-04-12 DIAGNOSIS — R55 Syncope and collapse: Secondary | ICD-10-CM | POA: Diagnosis not present

## 2023-04-12 DIAGNOSIS — R531 Weakness: Secondary | ICD-10-CM | POA: Diagnosis not present

## 2023-04-12 LAB — BASIC METABOLIC PANEL
Anion gap: 8 (ref 5–15)
BUN: 16 mg/dL (ref 8–23)
CO2: 23 mmol/L (ref 22–32)
Calcium: 9.6 mg/dL (ref 8.9–10.3)
Chloride: 99 mmol/L (ref 98–111)
Creatinine, Ser: 1.08 mg/dL — ABNORMAL HIGH (ref 0.44–1.00)
GFR, Estimated: 47 mL/min — ABNORMAL LOW (ref >60)
Glucose, Bld: 88 mg/dL (ref 70–99)
Potassium: 3.6 mmol/L (ref 3.5–5.1)
Sodium: 130 mmol/L — ABNORMAL LOW (ref 135–145)

## 2023-04-12 LAB — MAGNESIUM: Magnesium: 1.8 mg/dL (ref 1.7–2.4)

## 2023-04-12 MED ORDER — DICLOFENAC SODIUM 1 % EX GEL
2.0000 g | Freq: Three times a day (TID) | CUTANEOUS | Status: DC
  Administered 2023-04-12 (×3): 2 g via TOPICAL
  Filled 2023-04-12: qty 100

## 2023-04-12 MED ORDER — MAGNESIUM SULFATE 2 GM/50ML IV SOLN
2.0000 g | Freq: Once | INTRAVENOUS | Status: AC
  Administered 2023-04-12: 2 g via INTRAVENOUS
  Filled 2023-04-12: qty 50

## 2023-04-12 MED ORDER — METOPROLOL SUCCINATE ER 25 MG PO TB24
12.5000 mg | ORAL_TABLET | Freq: Every day | ORAL | Status: DC
  Administered 2023-04-12 – 2023-04-24 (×12): 12.5 mg via ORAL
  Filled 2023-04-12 (×14): qty 1

## 2023-04-12 MED ORDER — SPIRONOLACTONE 12.5 MG HALF TABLET
12.5000 mg | ORAL_TABLET | Freq: Every day | ORAL | Status: DC
  Administered 2023-04-12 – 2023-04-15 (×4): 12.5 mg via ORAL
  Filled 2023-04-12 (×5): qty 1

## 2023-04-12 MED ORDER — FUROSEMIDE 20 MG PO TABS
20.0000 mg | ORAL_TABLET | Freq: Every day | ORAL | Status: DC
Start: 2023-04-12 — End: 2023-04-14
  Administered 2023-04-12 – 2023-04-14 (×3): 20 mg via ORAL
  Filled 2023-04-12 (×3): qty 1

## 2023-04-12 MED ORDER — HYDROCODONE-ACETAMINOPHEN 5-325 MG PO TABS
1.0000 | ORAL_TABLET | Freq: Once | ORAL | Status: AC
  Administered 2023-04-12: 1 via ORAL
  Filled 2023-04-12: qty 1

## 2023-04-12 NOTE — Plan of Care (Signed)
  Problem: Education: Goal: Knowledge of General Education information will improve Description: Including pain rating scale, medication(s)/side effects and non-pharmacologic comfort measures Outcome: Progressing   Problem: Clinical Measurements: Goal: Respiratory complications will improve Outcome: Progressing Goal: Cardiovascular complication will be avoided Outcome: Progressing   Problem: Activity: Goal: Risk for activity intolerance will decrease Outcome: Progressing   Problem: Nutrition: Goal: Adequate nutrition will be maintained Outcome: Progressing   Problem: Coping: Goal: Level of anxiety will decrease Outcome: Progressing   Problem: Elimination: Goal: Will not experience complications related to bowel motility Outcome: Progressing Goal: Will not experience complications related to urinary retention Outcome: Progressing   Problem: Skin Integrity: Goal: Risk for impaired skin integrity will decrease Outcome: Progressing

## 2023-04-12 NOTE — Progress Notes (Signed)
 PROGRESS NOTE    Stephanie Atkinson  WGN:[id number] DOB: Jul 13, 1924 DOA: 04/08/2023 PCP: Linus Galas, NP    Chief Complaint  Patient presents with   Weakness    Brief Narrative:  Patient 89 year old female history of chronic HF R EF, CKD 3B, CAD status post CABG in 2006, hypertension, hyperlipidemia, hypothyroidism, paroxysmal A-fib on Eliquis recently admitted from 04/06/2023-04/08/2023 for acute on chronic HFrEF presenting on day of discharge with complaints of generalized weakness 45 minutes after she arrived at home.  Daughter describes episode which seems more presyncope nature.  Patient subsequently brought to the ED.  On presentation to the ED patient noted to have soft blood pressures, afebrile, no leukocytosis, urinalysis consistent with a UTI.  Patient placed on antibiotics.  Antihypertensive medications held.     Assessment & Plan:   Principal Problem:   Generalized weakness Active Problems:   Near syncope   Symptomatic bradycardia s/p PPM    CKD stage 3b, GFR 30-44 ml/min (HCC)   Acquired hypothyroidism   Essential hypertension   Dyslipidemia   CAD (coronary artery disease) of artery bypass graft   Dehydration   Paroxysmal atrial fibrillation (HCC)   HLD (hyperlipidemia)   Chronic diastolic heart failure (HCC)   UTI (urinary tract infection)  #1 generalized weakness/near syncope -Likely multifactorial secondary to overdiuresis during recent hospitalization in the setting of presumptive UTI, POA. -Patient noted to have soft blood pressure on admission. -Patient's home diuretics and antihypertensive medications held on admission.   -Patient improved clinically, systolic blood pressure improved.   -Urine cultures negative.   -Status post fosfomycin.   -PT/OT assessed patient recommending SNF placement.   -Continue fall precautions.    2.  Presumptive UTI, POA -Urine cultures negative.  -Patient afebrile. -Status post fosfomycin. -Supportive care. -No further  antibiotics needed.  3.  Chronic HFrEF -Euvolemic on examination. -Continue to hold home regimen of diuretics due to soft blood pressure earlier in the hospitalization which has improved. -Patient noted to have presented with a presyncopal episode. -Patient's Coreg, Entresto, Lasix initially held on admission.  -Patient seen in consultation by cardiology and patient started on Toprol-XL instead of Coreg today, patient started on a half home dose of spironolactone of 12.5 mg daily and Lasix 20 mg daily per cardiology.   -Cardiology following and appreciate input and recommendations.    4.  Paroxysmal A-fib on Eliquis -Currently rate controlled.   -Patient started on metoprolol succinate today per cardiology. -Continue Eliquis for anticoagulation.    5.  Hypothyroidism -Continue Synthroid.   6.  CKD 3B -Stable. -Close to baseline.  7.  CAD status post CABG -Continue home regimen Lipitor and Eliquis. -Patient being followed by cardiology and has been started on beta-blocker metoprolol succinate instead of Coreg to avoid additional blood pressure effects.  Patient also started on half home dose of spironolactone at 12.5 mg daily by cardiology as well as Lasix 20 mg daily.   -Per cardiology.     DVT prophylaxis: Eliquis Code Status: DNR Family Communication: Updated patient.  No family at bedside.   Disposition: Patient should be medically stable for discharge to SNF on 04/13/2023.   Status is: Observation The patient remains OBS appropriate and will d/c before 2 midnights.   Consultants:  Cardiology: Dr Jacques Navy 04/10/2023  Procedures:  CT head 04/08/2023 CT abdomen pelvis 04/08/2023  Antimicrobials:  Anti-infectives (From admission, onward)    Start     Dose/Rate Route Frequency Ordered Stop   04/09/23 0915  cefTRIAXone (ROCEPHIN) 2  g in sodium chloride 0.9 % 100 mL IVPB  Status:  Discontinued        2 g 200 mL/hr over 30 Minutes Intravenous Every 24 hours 04/09/23 0909  04/09/23 0933   04/08/23 2245  fosfomycin (MONUROL) packet 3 g        3 g Oral  Once 04/08/23 2232 04/09/23 0035         Subjective: Sitting up at the side of the bed.  States she had a rough night with complaints of significant pain in the right hip and right knee area.  Denies any shortness of breath.  No chest pain.  No lightheadedness or dizziness.  No abdominal pain.  Tolerating current diet.    Objective: Vitals:   04/12/23 0009 04/12/23 0444 04/12/23 0514 04/12/23 0751  BP: (!) 149/60 (!) 122/59  (!) 137/42  Pulse: 61 (!) 59  60  Resp: 18 19  18   Temp: 98.2 F (36.8 C) 97.6 F (36.4 C)  97.9 F (36.6 C)  TempSrc: Oral Oral  Oral  SpO2: 99% 96%  99%  Weight:   57.2 kg   Height:        Intake/Output Summary (Last 24 hours) at 04/12/2023 1059 Last data filed at 04/11/2023 2314 Gross per 24 hour  Intake 100 ml  Output --  Net 100 ml   Filed Weights   04/10/23 0418 04/11/23 0500 04/12/23 0514  Weight: 55.9 kg 59.4 kg 57.2 kg    Examination:  General exam: NAD Respiratory system: Lungs clear to auscultation bilaterally.  No wheezes, no crackles, no rhonchi.  Fair air movement.  Speaking in full sentences. Cardiovascular system: RRR no murmurs rubs or gallops.  No JVD.  No lower extremity edema. Gastrointestinal system: Abdomen is soft, nontender, nondistended, positive bowel sounds.  No rebound.  No guarding.   Central nervous system: Alert and oriented. No focal neurological deficits. Extremities: Symmetric 5 x 5 power. Skin: No rashes, lesions or ulcers Psychiatry: Judgement and insight appear normal. Mood & affect appropriate.     Data Reviewed: I have personally reviewed following labs and imaging studies  CBC: Recent Labs  Lab 04/06/23 1743 04/07/23 0434 04/08/23 1801 04/09/23 0525 04/10/23 0604  WBC 4.1 4.2 6.4 5.4 4.3  NEUTROABS 2.3 2.7  --   --   --   HGB 10.9* 12.1 12.6 12.2 11.3*  HCT 34.4* 36.5 38.0 37.3 34.2*  MCV 91.5 89.0 93.4 89.7 89.3   PLT 174 199 205 257 195    Basic Metabolic Panel: Recent Labs  Lab 04/07/23 0434 04/08/23 0222 04/08/23 1801 04/09/23 0630 04/10/23 0604 04/11/23 0751 04/12/23 0613  NA 134* 134* 133* 134* 135 132* 130*  K 3.8 3.5 4.2 3.7 3.5 3.7 3.6  CL 93* 98 95* 101 102 99 99  CO2 22 27 25 23 24 23 23   GLUCOSE 94 107* 115* 96 94 132* 88  BUN 15 16 18 17 18 18 16   CREATININE 1.00 1.32* 1.58* 1.39* 1.22* 1.12* 1.08*  CALCIUM 12.0* 10.3 10.8* 10.5* 9.8 10.0 9.6  MG 1.8 1.7  --  1.8  --  1.8 1.8  PHOS 2.6  --   --  2.4*  --  2.9  --     GFR: Estimated Creatinine Clearance: 24.1 mL/min (A) (by C-G formula based on SCr of 1.08 mg/dL (H)).  Liver Function Tests: Recent Labs  Lab 04/06/23 1743 04/07/23 0434 04/08/23 1918  AST 26 27 28   ALT 22 21 25   ALKPHOS  61 63 66  BILITOT 0.8 1.3* 0.9  PROT 5.6* 5.8* 6.5  ALBUMIN 3.2* 3.5 3.7    CBG: No results for input(s): "GLUCAP" in the last 168 hours.   Recent Results (from the past 240 hours)  MRSA Next Gen by PCR, Nasal     Status: None   Collection Time: 04/07/23  3:07 PM   Specimen: Nasal Mucosa; Nasal Swab  Result Value Ref Range Status   MRSA by PCR Next Gen NOT DETECTED NOT DETECTED Final    Comment: (NOTE) The GeneXpert MRSA Assay (FDA approved for NASAL specimens only), is one component of a comprehensive MRSA colonization surveillance program. It is not intended to diagnose MRSA infection nor to guide or monitor treatment for MRSA infections. Test performance is not FDA approved in patients less than 93 years old. Performed at [facility], [address], [place], [place] [address]   Urine Culture     Status: None   Collection Time: 04/09/23  7:27 AM   Specimen: Urine, Random  Result Value Ref Range Status   Specimen Description URINE, RANDOM  Final   Special Requests NONE Reflexed from [id number]  Final   Culture   Final    NO GROWTH Performed at [facility], [address], [place], [place] [address]     Report Status 04/10/2023 FINAL  Final         Radiology Studies: No results found.       Scheduled Meds:  acetaminophen  500 mg Oral TID   apixaban  2.5 mg Oral BID   atorvastatin  80 mg Oral QHS   feeding supplement  237 mL Oral BID BM   furosemide  20 mg Oral Daily   levothyroxine  50 mcg Oral QAC breakfast   metoprolol succinate  12.5 mg Oral Daily   spironolactone  12.5 mg Oral Daily   Continuous Infusions:   LOS: 0 days    Time spent: 35 minutes    Ramiro Harvest, MD [facility]   To contact the attending provider between 7A-7P or the covering provider during after hours 7P-7A, please log into the web site [email or web address] and access using universal [place] password for that web site. If you do not have the password, please call the hospital operator.  04/12/2023, 10:59 AM

## 2023-04-12 NOTE — Plan of Care (Signed)

## 2023-04-12 NOTE — Progress Notes (Signed)
 Patient Name: Stephanie Atkinson Date of Encounter: 04/12/2023 [facility] Cardiologist: Nanetta Batty, MD   Interval Summary  .    89 year old female with 5 hospital admissions and 2 ED visits in the last 6 months who presents again 40 minutes after her last hospital discharge with dizziness and orthostasis.  Since being in hospital she has remained nonorthostatic and we are holding all heart failure therapy as well as diuresis for the last several days.  Blood pressures yesterday were in the 140s and no change with position change.  She is overall very functionally well but recurrent hospitalizations are challenging with vacillating admissions for orthostasis and volume overload.  She appears euvolemic today, and is not particularly limited by heart failure symptoms.  At home she is on spironolactone, carvedilol, Entresto, furosemide.  She is not on SGLT2 inhibitor due to concern of UTIs and further hypovolemia.  While in hospital she has had grossly normal blood pressure, but when I historically review her recent admissions in November she did have elevated blood pressure on presentation with orthostasis.  Suspect her age contributes to poor vascular compliance and challenging to manage blood pressure both high and low.  She drinks significant amounts of fluid at home and at her last admission we had encouraged her to cut back on fluids slightly and once well diuresed she was transition back to her Lasix 20 mg / 40 mg alternating daily doses.  She was home for barely 40 minutes when she experienced dizziness and came back to the hospital.  Felt to have a UTI but urine culture is negative, she received fosfomycin.  Despite her overall functional status, she does have a reduced ejection fraction and is being followed by Dr. Royann Shivers as an outpatient with review of her pacemaker.  At his last visit in December 2024 he felt device function was normal with 100% ventricular pacing and permanent  atrial fibrillation, and if EF did not improve consideration of CRT-P should be had.  We did not repeat echocardiogram yet this admission.  She feels well today and has no significant concerns.   Vital Signs .    Vitals:   04/12/23 0009 04/12/23 0444 04/12/23 0514 04/12/23 0751  BP: (!) 149/60 (!) 122/59  (!) 137/42  Pulse: 61 (!) 59  60  Resp: 18 19  18   Temp: 98.2 F (36.8 C) 97.6 F (36.4 C)  97.9 F (36.6 C)  TempSrc: Oral Oral  Oral  SpO2: 99% 96%  99%  Weight:   57.2 kg   Height:        Intake/Output Summary (Last 24 hours) at 04/12/2023 0844 Last data filed at 04/11/2023 2314 Gross per 24 hour  Intake 100 ml  Output --  Net 100 ml      04/12/2023    5:14 AM 04/11/2023    5:00 AM 04/10/2023    4:18 AM  Last 3 Weights  Weight (lbs) 126 lb 1.7 oz 130 lb 15.3 oz 123 lb 3.8 oz  Weight (kg) 57.2 kg 59.4 kg 55.9 kg      Telemetry/ECG    Ventricular pacing- Personally Reviewed  Physical Exam .   GEN: No acute distress.   Neck: No JVD Cardiac: RRR, no murmurs, rubs, or gallops.  Respiratory: Clear to auscultation bilaterally. GI: Soft, nontender, non-distended  MS: No edema  Assessment & Plan .     Chronic HFrEF Ischemic cardiomyopathy Presyncope Orthostatic hypotension -Needs to wear compression socks.  EF has been reduced,  we will reassess EF as outpatient given that this admission was primarily for dizziness shortly after hospital discharge, we may get better image quality as an outpatient in a controlled setting with optimal assessment of ejection fraction for decisions about device. -Blood pressure has been mildly elevated with 1 low normal reading yesterday.  I am suspicious that she may not be tolerating her heart failure therapy as well as she previously has, and it may be beneficial to hold certain therapies until close outpatient follow-up.  I think I would like to hold her Sherryll Burger, dose recently reduced in December, but we can slowly resume her  spironolactone, Lasix.  She has a pacemaker so bradycardia is not of great concern, however she may do better on metoprolol then Coreg.  Would prefer a permissive blood pressure in the 130s-140s for her to mitigate symptoms of orthostasis.  Again recall orthostatics were negative yesterday. -I have resumed Lasix 20 mg daily p.o. -Okay to resume spironolactone at half dose 12.5 mg daily today.  Reasonable to resume her diuretic agents given that she just had a presentation several days ago for decompensated heart failure. -Continue to hold Entresto.  We may want to hold this until outpatient follow-up so long as blood pressure does not become severely elevated. -May be reasonable to resume beta-blocker but will use metoprolol succinate to avoid additional blood pressure effects of Coreg.  CAD status post CABG -No current chest pain, patient is anticoagulated with Eliquis so no aspirin.  Patient continues on atorvastatin 80 mg daily.  Permanent atrial fibrillation Symptomatic bradycardia status post pacemaker in 2019 -Appropriate device function at last hospital visit several days ago -Continue Eliquis 2.5 mg twice daily with reduced dose for age and weight.  Hyperlipidemia  -continue Lipitor 80 mg daily  CKD stage III -Creatinine mildly improved today 1.08.  Will plan to resume patient's Lasix today.  For questions or updates, please contact [facility] Please consult [email or web address] for contact info under        Signed, Parke Poisson, MD

## 2023-04-13 DIAGNOSIS — R531 Weakness: Secondary | ICD-10-CM | POA: Diagnosis not present

## 2023-04-13 DIAGNOSIS — E039 Hypothyroidism, unspecified: Secondary | ICD-10-CM | POA: Diagnosis not present

## 2023-04-13 DIAGNOSIS — I25810 Atherosclerosis of coronary artery bypass graft(s) without angina pectoris: Secondary | ICD-10-CM | POA: Diagnosis not present

## 2023-04-13 DIAGNOSIS — N3000 Acute cystitis without hematuria: Secondary | ICD-10-CM | POA: Diagnosis not present

## 2023-04-13 DIAGNOSIS — N1832 Chronic kidney disease, stage 3b: Secondary | ICD-10-CM | POA: Diagnosis not present

## 2023-04-13 DIAGNOSIS — R55 Syncope and collapse: Secondary | ICD-10-CM | POA: Diagnosis not present

## 2023-04-13 DIAGNOSIS — I10 Essential (primary) hypertension: Secondary | ICD-10-CM | POA: Diagnosis not present

## 2023-04-13 DIAGNOSIS — I5032 Chronic diastolic (congestive) heart failure: Secondary | ICD-10-CM | POA: Diagnosis not present

## 2023-04-13 DIAGNOSIS — E860 Dehydration: Secondary | ICD-10-CM | POA: Diagnosis not present

## 2023-04-13 LAB — BASIC METABOLIC PANEL
Anion gap: 10 (ref 5–15)
BUN: 16 mg/dL (ref 8–23)
CO2: 24 mmol/L (ref 22–32)
Calcium: 10.1 mg/dL (ref 8.9–10.3)
Chloride: 97 mmol/L — ABNORMAL LOW (ref 98–111)
Creatinine, Ser: 1.07 mg/dL — ABNORMAL HIGH (ref 0.44–1.00)
GFR, Estimated: 48 mL/min — ABNORMAL LOW (ref >60)
Glucose, Bld: 88 mg/dL (ref 70–99)
Potassium: 4 mmol/L (ref 3.5–5.1)
Sodium: 131 mmol/L — ABNORMAL LOW (ref 135–145)

## 2023-04-13 LAB — CBC
HCT: 32.3 % — ABNORMAL LOW (ref 36.0–46.0)
Hemoglobin: 10.7 g/dL — ABNORMAL LOW (ref 12.0–15.0)
MCH: 29.4 pg (ref 26.0–34.0)
MCHC: 33.1 g/dL (ref 30.0–36.0)
MCV: 88.7 fL (ref 80.0–100.0)
Platelets: 172 10*3/uL (ref 150–400)
RBC: 3.64 MIL/uL — ABNORMAL LOW (ref 3.87–5.11)
RDW: 13.7 % (ref 11.5–15.5)
WBC: 4.5 10*3/uL (ref 4.0–10.5)
nRBC: 0 % (ref 0.0–0.2)

## 2023-04-13 LAB — MAGNESIUM: Magnesium: 2.3 mg/dL (ref 1.7–2.4)

## 2023-04-13 MED ORDER — FLUTICASONE PROPIONATE 50 MCG/ACT NA SUSP
2.0000 | Freq: Every day | NASAL | Status: DC
  Administered 2023-04-15 – 2023-04-24 (×9): 2 via NASAL
  Filled 2023-04-13: qty 16

## 2023-04-13 MED ORDER — BISACODYL 10 MG RE SUPP: 10.0000 mg | Freq: Once | RECTAL | Status: DC

## 2023-04-13 MED ORDER — ACETAMINOPHEN 500 MG PO TABS
1000.0000 mg | ORAL_TABLET | Freq: Three times a day (TID) | ORAL | Status: DC
  Administered 2023-04-13 – 2023-04-16 (×8): 1000 mg via ORAL
  Filled 2023-04-13 (×10): qty 2

## 2023-04-13 MED ORDER — LORATADINE 10 MG PO TABS
10.0000 mg | ORAL_TABLET | Freq: Every day | ORAL | Status: DC
  Administered 2023-04-13 – 2023-04-24 (×12): 10 mg via ORAL
  Filled 2023-04-13 (×12): qty 1

## 2023-04-13 MED ORDER — DICLOFENAC SODIUM 1 % EX GEL
2.0000 g | Freq: Four times a day (QID) | CUTANEOUS | Status: AC
  Administered 2023-04-13 – 2023-04-17 (×19): 2 g via TOPICAL
  Filled 2023-04-13: qty 100

## 2023-04-13 NOTE — Progress Notes (Signed)
 PROGRESS NOTE    Stephanie Atkinson  UVO:[id number] DOB: [birth year] DOA: 04/08/2023 PCP: Linus Galas, NP    Chief Complaint  Patient presents with   Weakness    Brief Narrative:  Patient 89 year old female history of chronic HF R EF, CKD 3B, CAD status post CABG in 2006, hypertension, hyperlipidemia, hypothyroidism, paroxysmal A-fib on Eliquis recently admitted from 04/06/2023-04/08/2023 for acute on chronic HFrEF presenting on day of discharge with complaints of generalized weakness 45 minutes after she arrived at home.  Daughter describes episode which seems more presyncope nature.  Patient subsequently brought to the ED.  On presentation to the ED patient noted to have soft blood pressures, afebrile, no leukocytosis, urinalysis consistent with a UTI.  Patient placed on antibiotics.  Antihypertensive medications held.     Assessment & Plan:   Principal Problem:   Generalized weakness Active Problems:   Near syncope   Symptomatic bradycardia s/p PPM    CKD stage 3b, GFR 30-44 ml/min (HCC)   Acquired hypothyroidism   Essential hypertension   Dyslipidemia   CAD (coronary artery disease) of artery bypass graft   Dehydration   Paroxysmal atrial fibrillation (HCC)   HLD (hyperlipidemia)   Chronic diastolic heart failure (HCC)   UTI (urinary tract infection)  #1 generalized weakness/near syncope -Likely multifactorial secondary to overdiuresis during recent hospitalization in the setting of presumptive UTI, POA. -Patient noted to have soft blood pressure on admission. -Patient's home diuretics and antihypertensive medications held on admission.   -Patient improved clinically, systolic blood pressure improved.   -Urine cultures negative.   -Status post fosfomycin.   -PT/OT assessed patient recommending SNF placement.   -Continue fall precautions.    2.  Presumptive UTI, POA -Urine cultures negative.  -Patient afebrile. -Status post fosfomycin. -Supportive care. -No further  antibiotics needed.  3.  Chronic HFrEF -Euvolemic on examination. -Continue to hold home regimen of diuretics due to soft blood pressure earlier in the hospitalization which has improved. -Patient noted to have presented with a presyncopal episode. -Patient's Coreg, Entresto, Lasix initially held on admission.   -Patient seen in consultation by cardiology and patient started on Toprol-XL instead of Coreg, patient started on a half home dose of spironolactone of 12.5 mg daily and Lasix 20 mg daily per cardiology on 04/12/2023.   -Patient currently tolerating current cardiac regimen. -Cardiology following and appreciate input and recommendations.    4.  Paroxysmal A-fib on Eliquis -Rate controlled on metoprolol succinate per cardiology recommendations.   -Eliquis for anticoagulation.   5.  Hypothyroidism -Synthroid.    6.  CKD 3B -Stable. -Close to baseline.  7.  CAD status post CABG -Continue home regimen Lipitor and Eliquis. -Patient being followed by cardiology and has been started on beta-blocker metoprolol succinate instead of Coreg to avoid additional blood pressure effects.  Patient also started on half home dose of spironolactone at 12.5 mg daily by cardiology as well as Lasix 20 mg daily.   -Patient tolerating current cardiac regimen. -Per cardiology.  8.  Probable osteoarthritis of the right hip and knee -Place on Tylenol 1000 mg 3 times daily. -Voltaren gel 4 times daily. -Supportive care. -PT/OT.    DVT prophylaxis: Eliquis Code Status: DNR Family Communication: Updated patient.  No family at bedside.   Disposition: Patient medically stable for discharge as of 04/13/2023.  SNF placement.   Status is: Observation The patient remains OBS appropriate and will d/c before 2 midnights.   Consultants:  Cardiology: Dr Jacques Navy 04/10/2023  Procedures:  CT  head 04/08/2023 CT abdomen pelvis 04/08/2023  Antimicrobials:  Anti-infectives (From admission, onward)    Start      Dose/Rate Route Frequency Ordered Stop   04/09/23 0915  cefTRIAXone (ROCEPHIN) 2 g in sodium chloride 0.9 % 100 mL IVPB  Status:  Discontinued        2 g 200 mL/hr over 30 Minutes Intravenous Every 24 hours 04/09/23 0909 04/09/23 0933   04/08/23 2245  fosfomycin (MONUROL) packet 3 g        3 g Oral  Once 04/08/23 2232 04/09/23 0035         Subjective: Patient laying in bed.  Denies any chest pain or shortness of breath.  No abdominal pain.  Still with complaints of right hip and right knee pain with some improvement after being started on Voltaren gel however stated yesterday evening had significant pain.  Tolerating current diet.  No lightheadedness or dizziness.   Objective: Vitals:   04/12/23 2334 04/13/23 0313 04/13/23 0412 04/13/23 0825  BP: (!) 145/56 (!) 149/52  (!) 151/71  Pulse: 63 (!) 59  60  Resp: 18 18  18   Temp: 98.2 F (36.8 C) 98 F (36.7 C)  98.3 F (36.8 C)  TempSrc: Oral Oral  Oral  SpO2: 97% 100%  99%  Weight:   57.7 kg   Height:        Intake/Output Summary (Last 24 hours) at 04/13/2023 1056 Last data filed at 04/13/2023 0900 Gross per 24 hour  Intake 600 ml  Output --  Net 600 ml   Filed Weights   04/11/23 0500 04/12/23 0514 04/13/23 0412  Weight: 59.4 kg 57.2 kg 57.7 kg    Examination:  General exam: NAD Respiratory system: Lungs clear to auscultation bilaterally.  No wheezes, no crackles, no rhonchi.  Fair air movement.  Speaking in full sentences. Cardiovascular system: Regular rate rhythm no murmurs rubs or gallops.  No JVD.  No lower extremity edema.  Gastrointestinal system: Abdomen is soft, nontender, nondistended, positive bowel sounds.  No rebound.  No guarding.  Central nervous system: Alert and oriented. No focal neurological deficits. Extremities: Symmetric 5 x 5 power. Skin: No rashes, lesions or ulcers Psychiatry: Judgement and insight appear normal. Mood & affect appropriate.     Data Reviewed: I have personally reviewed  following labs and imaging studies  CBC: Recent Labs  Lab 04/06/23 1743 04/07/23 0434 04/08/23 1801 04/09/23 0525 04/10/23 0604 04/13/23 0657  WBC 4.1 4.2 6.4 5.4 4.3 4.5  NEUTROABS 2.3 2.7  --   --   --   --   HGB 10.9* 12.1 12.6 12.2 11.3* 10.7*  HCT 34.4* 36.5 38.0 37.3 34.2* 32.3*  MCV 91.5 89.0 93.4 89.7 89.3 88.7  PLT 174 199 205 257 195 172    Basic Metabolic Panel: Recent Labs  Lab 04/07/23 0434 04/08/23 0222 04/08/23 1801 04/09/23 0630 04/10/23 0604 04/11/23 0751 04/12/23 0613 04/13/23 0657  NA 134* 134*   < > 134* 135 132* 130* 131*  K 3.8 3.5   < > 3.7 3.5 3.7 3.6 4.0  CL 93* 98   < > 101 102 99 99 97*  CO2 22 27   < > 23 24 23 23 24   GLUCOSE 94 107*   < > 96 94 132* 88 88  BUN 15 16   < > 17 18 18 16 16   CREATININE 1.00 1.32*   < > 1.39* 1.22* 1.12* 1.08* 1.07*  CALCIUM 12.0* 10.3   < >  10.5* 9.8 10.0 9.6 10.1  MG 1.8 1.7  --  1.8  --  1.8 1.8 2.3  PHOS 2.6  --   --  2.4*  --  2.9  --   --    < > = values in this interval not displayed.    GFR: Estimated Creatinine Clearance: 24.3 mL/min (A) (by C-G formula based on SCr of 1.07 mg/dL (H)).  Liver Function Tests: Recent Labs  Lab 04/06/23 1743 04/07/23 0434 04/08/23 1918  AST 26 27 28   ALT 22 21 25   ALKPHOS 61 63 66  BILITOT 0.8 1.3* 0.9  PROT 5.6* 5.8* 6.5  ALBUMIN 3.2* 3.5 3.7    CBG: No results for input(s): "GLUCAP" in the last 168 hours.   Recent Results (from the past 240 hours)  MRSA Next Gen by PCR, Nasal     Status: None   Collection Time: 04/07/23  3:07 PM   Specimen: Nasal Mucosa; Nasal Swab  Result Value Ref Range Status   MRSA by PCR Next Gen NOT DETECTED NOT DETECTED Final    Comment: (NOTE) The GeneXpert MRSA Assay (FDA approved for NASAL specimens only), is one component of a comprehensive MRSA colonization surveillance program. It is not intended to diagnose MRSA infection nor to guide or monitor treatment for MRSA infections. Test performance is not FDA approved  in patients less than 55 years old. Performed at [facility], [address] [place], [place] [address]   Urine Culture     Status: None   Collection Time: 04/09/23  7:27 AM   Specimen: Urine, Random  Result Value Ref Range Status   Specimen Description URINE, RANDOM  Final   Special Requests NONE Reflexed from [id number]  Final   Culture   Final    NO GROWTH Performed at [facility], [address] [place], [place] [address]    Report Status 04/10/2023 FINAL  Final         Radiology Studies: No results found.       Scheduled Meds:  acetaminophen  1,000 mg Oral TID   apixaban  2.5 mg Oral BID   atorvastatin  80 mg Oral QHS   diclofenac Sodium  2 g Topical QID   feeding supplement  237 mL Oral BID BM   furosemide  20 mg Oral Daily   levothyroxine  50 mcg Oral QAC breakfast   metoprolol succinate  12.5 mg Oral Daily   spironolactone  12.5 mg Oral Daily   Continuous Infusions:   LOS: 0 days    Time spent: 35 minutes    Ramiro Harvest, MD [facility]   To contact the attending provider between 7A-7P or the covering provider during after hours 7P-7A, please log into the web site [email or web address] and access using universal  password for that web site. If you do not have the password, please call the hospital operator.  04/13/2023, 10:56 AM

## 2023-04-13 NOTE — TOC Progression Note (Signed)
 Transition of Care [facility]) - Progression Note    Patient Details  Name: Stephanie Atkinson MRN: [id number] Date of Birth: [birth year]  Transition of Care [facility]) CM/SW Contact  Dellie Burns [place], [place] Phone Number: 04/13/2023, 9:58 AM  Clinical Narrative:   Updated MD note uploaded to [employer] at their request. Auth for [facility] remains pending at this time.   Dellie Burns, MSW, LCSW [phone] (coverage)      Expected Discharge Plan: Skilled Nursing Facility Barriers to Discharge: Continued Medical Work up  Expected Discharge Plan and Services   Discharge Planning Services: CM Consult   Living arrangements for the past 2 months: Apartment                                       Social Determinants of Health (SDOH) Interventions SDOH Screenings   Food Insecurity: No Food Insecurity (04/09/2023)  Housing: Low Risk  (04/09/2023)  Transportation Needs: No Transportation Needs (04/09/2023)  Utilities: Not At Risk (04/09/2023)  Financial Resource Strain: Low Risk  (03/04/2023)  Social Connections: Socially Isolated (04/09/2023)  Tobacco Use: High Risk (04/09/2023)    Readmission Risk Interventions    03/11/2023   10:35 AM  Readmission Risk Prevention Plan  Transportation Screening Complete  PCP or Specialist Appt within 5-7 Days Complete  Home Care Screening Complete  Medication Review (RN CM) Complete

## 2023-04-13 NOTE — Progress Notes (Signed)
 Chart reviewed. Tolerating medical therapy well. We discussed if she feels well on this regimen, ok to DC from CV standpoint. I have requested f/u 1-2 weeks and d/w Dr. Janee Morn.  Cardiology will sign off.  Parke Poisson, MD

## 2023-04-13 NOTE — Plan of Care (Signed)
  Problem: Education: Goal: Knowledge of General Education information will improve Description: Including pain rating scale, medication(s)/side effects and non-pharmacologic comfort measures Outcome: Progressing   Problem: Clinical Measurements: Goal: Ability to maintain clinical measurements within normal limits will improve Outcome: Progressing Goal: Cardiovascular complication will be avoided Outcome: Progressing   Problem: Activity: Goal: Risk for activity intolerance will decrease Outcome: Progressing   Problem: Pain Managment: Goal: General experience of comfort will improve and/or be controlled Outcome: Progressing   Problem: Safety: Goal: Ability to remain free from injury will improve Outcome: Progressing   Problem: Skin Integrity: Goal: Risk for impaired skin integrity will decrease Outcome: Progressing

## 2023-04-13 NOTE — Plan of Care (Signed)
 Alert and oriented. OOB to chair for majority of shift.  Ambulatory to bathroom.  Problem: Education: Goal: Knowledge of General Education information will improve Description: Including pain rating scale, medication(s)/side effects and non-pharmacologic comfort measures Outcome: Progressing   Problem: Health Behavior/Discharge Planning: Goal: Ability to manage health-related needs will improve Outcome: Progressing   Problem: Clinical Measurements: Goal: Ability to maintain clinical measurements within normal limits will improve Outcome: Progressing Goal: Will remain free from infection Outcome: Progressing Goal: Diagnostic test results will improve Outcome: Progressing

## 2023-04-14 ENCOUNTER — Observation Stay (HOSPITAL_COMMUNITY): Payer: Medicare PPO

## 2023-04-14 DIAGNOSIS — I5032 Chronic diastolic (congestive) heart failure: Secondary | ICD-10-CM | POA: Diagnosis not present

## 2023-04-14 DIAGNOSIS — J810 Acute pulmonary edema: Secondary | ICD-10-CM | POA: Diagnosis not present

## 2023-04-14 DIAGNOSIS — I480 Paroxysmal atrial fibrillation: Secondary | ICD-10-CM | POA: Diagnosis not present

## 2023-04-14 DIAGNOSIS — I4821 Permanent atrial fibrillation: Secondary | ICD-10-CM

## 2023-04-14 DIAGNOSIS — J90 Pleural effusion, not elsewhere classified: Secondary | ICD-10-CM | POA: Diagnosis not present

## 2023-04-14 DIAGNOSIS — N3000 Acute cystitis without hematuria: Secondary | ICD-10-CM | POA: Diagnosis not present

## 2023-04-14 DIAGNOSIS — E860 Dehydration: Secondary | ICD-10-CM | POA: Diagnosis not present

## 2023-04-14 DIAGNOSIS — I5021 Acute systolic (congestive) heart failure: Secondary | ICD-10-CM | POA: Diagnosis not present

## 2023-04-14 DIAGNOSIS — R531 Weakness: Secondary | ICD-10-CM | POA: Diagnosis not present

## 2023-04-14 DIAGNOSIS — I25810 Atherosclerosis of coronary artery bypass graft(s) without angina pectoris: Secondary | ICD-10-CM | POA: Diagnosis not present

## 2023-04-14 DIAGNOSIS — E871 Hypo-osmolality and hyponatremia: Secondary | ICD-10-CM

## 2023-04-14 DIAGNOSIS — I517 Cardiomegaly: Secondary | ICD-10-CM | POA: Diagnosis not present

## 2023-04-14 DIAGNOSIS — R55 Syncope and collapse: Secondary | ICD-10-CM | POA: Diagnosis not present

## 2023-04-14 DIAGNOSIS — N1832 Chronic kidney disease, stage 3b: Secondary | ICD-10-CM | POA: Diagnosis not present

## 2023-04-14 DIAGNOSIS — R0602 Shortness of breath: Secondary | ICD-10-CM | POA: Diagnosis not present

## 2023-04-14 DIAGNOSIS — J811 Chronic pulmonary edema: Secondary | ICD-10-CM | POA: Diagnosis not present

## 2023-04-14 DIAGNOSIS — E039 Hypothyroidism, unspecified: Secondary | ICD-10-CM | POA: Diagnosis not present

## 2023-04-14 DIAGNOSIS — I10 Essential (primary) hypertension: Secondary | ICD-10-CM | POA: Diagnosis not present

## 2023-04-14 LAB — CBC
HCT: 35.2 % — ABNORMAL LOW (ref 36.0–46.0)
Hemoglobin: 11.8 g/dL — ABNORMAL LOW (ref 12.0–15.0)
MCH: 29.9 pg (ref 26.0–34.0)
MCHC: 33.5 g/dL (ref 30.0–36.0)
MCV: 89.1 fL (ref 80.0–100.0)
Platelets: 187 10*3/uL (ref 150–400)
RBC: 3.95 MIL/uL (ref 3.87–5.11)
RDW: 13.5 % (ref 11.5–15.5)
WBC: 6.3 10*3/uL (ref 4.0–10.5)
nRBC: 0 % (ref 0.0–0.2)

## 2023-04-14 LAB — BASIC METABOLIC PANEL WITH GFR
Anion gap: 11 (ref 5–15)
BUN: 17 mg/dL (ref 8–23)
CO2: 21 mmol/L — ABNORMAL LOW (ref 22–32)
Calcium: 10.4 mg/dL — ABNORMAL HIGH (ref 8.9–10.3)
Chloride: 96 mmol/L — ABNORMAL LOW (ref 98–111)
Creatinine, Ser: 0.97 mg/dL (ref 0.44–1.00)
GFR, Estimated: 53 mL/min — ABNORMAL LOW
Glucose, Bld: 93 mg/dL (ref 70–99)
Potassium: 4.6 mmol/L (ref 3.5–5.1)
Sodium: 128 mmol/L — ABNORMAL LOW (ref 135–145)

## 2023-04-14 LAB — OSMOLALITY: Osmolality: 283 mosm/kg (ref 275–295)

## 2023-04-14 MED ORDER — TRAMADOL HCL 50 MG PO TABS
25.0000 mg | ORAL_TABLET | Freq: Once | ORAL | Status: AC
  Administered 2023-04-14: 25 mg via ORAL
  Filled 2023-04-14: qty 1

## 2023-04-14 MED ORDER — LOSARTAN POTASSIUM 25 MG PO TABS
12.5000 mg | ORAL_TABLET | Freq: Every day | ORAL | Status: DC
  Administered 2023-04-14 – 2023-04-23 (×10): 12.5 mg via ORAL
  Filled 2023-04-14 (×10): qty 1

## 2023-04-14 MED ORDER — FUROSEMIDE 10 MG/ML IJ SOLN
40.0000 mg | Freq: Once | INTRAMUSCULAR | Status: AC
  Administered 2023-04-14: 40 mg via INTRAVENOUS
  Filled 2023-04-14: qty 4

## 2023-04-14 NOTE — Progress Notes (Signed)
 Physical Therapy Treatment Patient Details Name: Stephanie Atkinson MRN: [id number] DOB: August 31, 1924 Today's Date: 04/14/2023   History of Present Illness Pt is a 89 y.o. female recently admitted from 04/06/2023 until 04/08/2023 for acute on chronic HFrEF, who presents on the day of discharge with complaints of generalized weakness 45 minutes after she arrived at her home. Possible overdiuresis, presumptive UTI, euvolemic on exam. PMH: HTN, HLD, CAD s/p CABG, paroxysmal atrial fibrillation, hypothyroidism, CKD stage 3B, MI, syncope, chronic HFrEF, R THA    PT Comments  Patient's orthostatic BPs taken in sitting, standing, and after walking (she was already up in recliner on arrival). BPs were normal see flowsheet. She did however report dizziness with ambulation that did not get better or worse the longer she was up, but did improve once seated. Asked to describe the feeling without using the word dizzy and she could not. Will need to continue education with safe use of rollator (pt reports she uses rollator at home).    If plan is discharge home, recommend the following: Assist for transportation;Assistance with cooking/housework;A lot of help with walking and/or transfers;Help with stairs or ramp for entrance   Can travel by private vehicle     Yes  Equipment Recommendations  None recommended by PT    Recommendations for Other Services       Precautions / Restrictions Precautions Precautions: Fall Restrictions Weight Bearing Restrictions Per Provider Order: No     Mobility  Bed Mobility               General bed mobility comments: pt up in chair upon arrival    Transfers Overall transfer level: Needs assistance Equipment used: Rolling walker (2 wheels) Transfers: Sit to/from Stand Sit to Stand: Contact guard assist           General transfer comment: from recliner x 2; vc for hand placement; CGA for safety due to dizziness    Ambulation/Gait Ambulation/Gait assistance:  Supervision Gait Distance (Feet): 130 Feet Assistive device: Rolling walker (2 wheels) Gait Pattern/deviations: Step-through pattern, Decreased stride length, Trunk flexed       General Gait Details: at end of second walk, pt "parked" RW ~4 ft from recliner and held onto bed rail with intention of walking to chair with rail only. vc for safe use of RW and keeping it with her until she reaches her destination   Stairs             Wheelchair Mobility     Tilt Bed    Modified Rankin (Stroke Patients Only)       Balance Overall balance assessment: Needs assistance Sitting-balance support: No upper extremity supported, Feet supported Sitting balance-Leahy Scale: Good     Standing balance support: No upper extremity supported, During functional activity Standing balance-Leahy Scale: Fair                              Hotel manager: No apparent difficulties  Cognition Arousal: Alert Behavior During Therapy: WFL for tasks assessed/performed                             Following commands: Intact      Cueing Cueing Techniques: Verbal cues  Exercises General Exercises - Lower Extremity Long Arc Quad: AROM, Both, 10 reps, Seated (3 sec hold) Hip Flexion/Marching: AROM, Both, 10 reps, Seated Toe Raises: AROM, Both, 10 reps, Seated  Heel Raises: AROM, Both, 10 reps    General Comments General comments (skin integrity, edema, etc.): see vitals flowsheet for orthostatic BPs      Pertinent Vitals/Pain Pain Assessment Pain Assessment: No/denies pain    Home Living                          Prior Function            PT Goals (current goals can now be found in the care plan section) Acute Rehab PT Goals Patient Stated Goal: to not fall PT Goal Formulation: With patient/family Time For Goal Achievement: 04/24/23 Potential to Achieve Goals: Good Progress towards PT goals: Progressing toward goals     Frequency    Min 1X/week      PT Plan      Co-evaluation              AM-PAC PT "6 Clicks" Mobility   Outcome Measure  Help needed turning from your back to your side while in a flat bed without using bedrails?: None Help needed moving from lying on your back to sitting on the side of a flat bed without using bedrails?: None Help needed moving to and from a bed to a chair (including a wheelchair)?: A Little Help needed standing up from a chair using your arms (e.g., wheelchair or bedside chair)?: A Little Help needed to walk in hospital room?: A Little Help needed climbing 3-5 steps with a railing? : A Little 6 Click Score: 20    End of Session Equipment Utilized During Treatment: Gait belt Activity Tolerance: Patient tolerated treatment well Patient left: in chair;with call bell/phone within reach;with chair alarm set Nurse Communication: Mobility status PT Visit Diagnosis: Other abnormalities of gait and mobility (R26.89);History of falling (Z91.81)     Time: 9147-8295 PT Time Calculation (min) (ACUTE ONLY): 23 min  Charges:    $Gait Training: 8-22 mins $Therapeutic Exercise: 8-22 mins PT General Charges $$ ACUTE PT VISIT: 1 Visit                      Jerolyn Center, PT Acute Rehabilitation Services  Office [phone]    Zena Amos 04/14/2023, 11:24 AM

## 2023-04-14 NOTE — TOC Progression Note (Signed)
 Transition of Care [facility]) - Progression Note    Patient Details  Name: Stephanie Atkinson MRN: [id number] Date of Birth: [birth year]  [facility]) CM/SW Contact  Kermit Balo, RN Phone Number: 04/14/2023, 4:01 PM  Clinical Narrative:     Still no decision from [facility] on SNF rehab.  TOC following.  Expected Discharge Plan: Skilled Nursing Facility Barriers to Discharge: Continued Medical Work up  Expected Discharge Plan and Services   Discharge Planning Services: CM Consult   Living arrangements for the past 2 months: Apartment                                       Social Determinants of Health (SDOH) Interventions SDOH Screenings   Food Insecurity: No Food Insecurity (04/09/2023)  Housing: Low Risk  (04/09/2023)  Transportation Needs: No Transportation Needs (04/09/2023)  Utilities: Not At Risk (04/09/2023)  Financial Resource Strain: Low Risk  (03/04/2023)  Social Connections: Socially Isolated (04/09/2023)  Tobacco Use: High Risk (04/09/2023)    Readmission Risk Interventions    03/11/2023   10:35 AM  Readmission Risk Prevention Plan  Transportation Screening Complete  PCP or Specialist Appt within 5-7 Days Complete  Home Care Screening Complete  Medication Review (RN CM) Complete

## 2023-04-14 NOTE — Plan of Care (Addendum)
 Pt alert, ambulating to the bathroom with modified assist and FWW. Pt has c/o aching BL hip and headache, scheduled Tylenol and Voltaren cream given. BP elevated, MD instructed to give morning BP meds scheduled at 10 AM earlier. Will continue to monitor pt.   Problem: Clinical Measurements: Goal: Ability to maintain clinical measurements within normal limits will improve Outcome: Progressing   Problem: Activity: Goal: Risk for activity intolerance will decrease Outcome: Progressing   Problem: Pain Managment: Goal: General experience of comfort will improve and/or be controlled Outcome: Progressing   Problem: Safety: Goal: Ability to remain free from injury will improve Outcome: Progressing

## 2023-04-14 NOTE — Progress Notes (Signed)
 Cardiology Progress Note  Patient ID: Stephanie Atkinson MRN: [id number] DOB: 1925/02/04 Date of Encounter: 04/14/2023 Primary Cardiologist: Nanetta Batty, MD  Subjective   Chief Complaint: SOB  HPI: More SOB. Na trending down. CXR with pulmonary edema   ROS:  All other ROS reviewed and negative. Pertinent positives noted in the HPI.     Telemetry  Overnight telemetry shows V paced 60s, which I personally reviewed.   ECG  The most recent ECG shows V paced 60 bpm, which I personally reviewed.   Physical Exam   Vitals:   04/14/23 0626 04/14/23 0748 04/14/23 1154 04/14/23 1546  BP: (!) 162/66 (!) 155/66 (!) 149/65 (!) 129/55  Pulse: 62 60 (!) 59 60  Resp:  16 18 19   Temp:  98.4 F (36.9 C) (!) 97.5 F (36.4 C) 98.1 F (36.7 C)  TempSrc:  Oral Oral Oral  SpO2: 95% 99% 100% 98%  Weight:      Height:       No intake or output data in the 24 hours ending 04/14/23 1635     04/14/2023    4:13 AM 04/13/2023    4:12 AM 04/12/2023    5:14 AM  Last 3 Weights  Weight (lbs) 123 lb 3.8 oz 127 lb 3.3 oz 126 lb 1.7 oz  Weight (kg) 55.9 kg 57.7 kg 57.2 kg    Body mass index is 22.54 kg/m.  General: Well nourished, well developed, in no acute distress Head: Atraumatic, normal size  Eyes: PEERLA, EOMI  Neck: Supple, JVD 7-8 cm H20 Endocrine: No thryomegaly Cardiac: Normal S1, S2; irregular rhythm, no murmurs Lungs: +crackles  Abd: Soft, nontender, no hepatomegaly  Ext: trace edema  Musculoskeletal: No deformities, BUE and BLE strength normal and equal Skin: Warm and dry, no rashes   Neuro: Alert and oriented to person, place, time, and situation, CNII-XII grossly intact, no focal deficits  Psych: Normal mood and affect   Cardiac Studies  TTE 01/09/2023  1. Abnormal (paradoxical) septal motion, consistent with RV pacemaker.      . Left ventricular ejection fraction, by estimation, is 35 to 40%. The  left ventricle has moderately decreased function. The left ventricle   demonstrates global hypokinesis. There is mild left ventricular  hypertrophy. Left ventricular diastolic  parameters are indeterminate.   2. Right ventricular systolic function is normal. The right ventricular  size is normal. There is normal pulmonary artery systolic pressure.   3. Left atrial size was severely dilated.   4. Right atrial size was severely dilated.   5. The mitral valve is degenerative. Trivial mitral valve regurgitation.  No evidence of mitral stenosis.   6. The aortic valve is tricuspid. Aortic valve regurgitation is trivial.  No aortic stenosis is present.   7. Pulmonic valve regurgitation is moderate.   Patient Profile  Stephanie Atkinson is a 89 y.o. female with CAD status post CABG, permanent atrial fibrillation, ischemic cardiomyopathy (EF 35 to 40%), hypertension, hyperlipidemia, heart block status post pacemaker implant admitted on 04/06/2023 for acute on chronic systolic heart failure.  Was diuresed but cardiology asked to reevaluate given worsening shortness of breath and concerns for reaccumulation of fluid.  Assessment & Plan   # Acute on chronic systolic heart failure, EF 35 to 40% # Ischemic cardiomyopathy # Orthostatic hypotension -Had worsening shortness of breath and sodium level was lower.  Chest x-ray with pulmonary edema. -Give one-time dose of IV Lasix 40 mg now. -Has had issues with orthostatic hypotension.  Compression  stockings ordered. -Her Sherryll Burger has been held.  Current regimen includes metoprolol succinate 12.5 mg daily, Aldactone 12.5 mg daily. -We will try her on losartan 12.5 mg daily.  This may have less effect on blood pressure. -I also think getting her on compression stockings will help. -Not on SGLT2 inhibitor due to recurrent UTI. -We can see how she does tonight.  May be ready for discharge tomorrow pending improvement in volume status.  # Permanent atrial fibrillation # Complete heart block status post PPM -No issues with pacemaker.   On Eliquis 2.5 mg twice daily.  # CKD stage IIIa -Stable  # CAD status post CABG -No angina.  On Eliquis.  No need for aspirin. -Continue Lipitor 80 mg daily.  # UTI # Generalized weakness # Hypothyroidism -Per primary team      For questions or updates, please contact [facility] Please consult [email or web address] for contact info under      Signed, Gerri Spore T. Flora Lipps, MD, [facility] Manuel Garcia  [facility]  04/14/2023 4:35 PM

## 2023-04-14 NOTE — Consult Note (Addendum)
 [facility] Liaison Consult Note   04/14/2023  ARIELE VIDRIO September 04, 1924 [id number]  Insurance: [employer]   Primary Care Provider: Linus Galas, NP with [facility], this provider is listed for the transition of care follow up appointments  and [facility] calls   [facility] at bedside at [facility] Patient up in recliner. Introduced reason for visit. Patient endorses PCP and planning for a SNF rehab disposition.   The patient was screened for 7 day readmission hospitalization with noted high risk score for unplanned readmission risk 2 ED and 5 hospital admissions in 6 months.  The patient was assessed for potential Community Care Coordination service needs for post hospital transition for care coordination. Review of patient's electronic medical record reveals patient is awaiting SNF placement for disposition and needing insurance auth.   Plan: [facility] Liaison will continue to follow progress and disposition to asess for post hospital community care coordination/management needs.  Referral request for community care coordination: If patient goes to a SNF facility for rehab then post hospital transitional needs are to be met at that level of care. Continue to follow.   [facility] Community Care, Population Health does not replace or interfere with any arrangements made by the Inpatient Transition of Care team.   For questions contact:   Charlesetta Shanks, RN, BSN, CCM [place]  [facility], [facility] Liaison Direct Dial: [phone] or secure chat Email: [email or web address]

## 2023-04-14 NOTE — Progress Notes (Addendum)
 PROGRESS NOTE    Stephanie Atkinson  ZSW:[id number] DOB: 11-17-1924 DOA: 04/08/2023 PCP: Linus Galas, NP    Chief Complaint  Patient presents with   Weakness    Brief Narrative:  Patient 89 year old female history of chronic HF R EF, CKD 3B, CAD status post CABG in 2006, hypertension, hyperlipidemia, hypothyroidism, paroxysmal A-fib on Eliquis recently admitted from 04/06/2023-04/08/2023 for acute on chronic HFrEF presenting on day of discharge with complaints of generalized weakness 45 minutes after she arrived at home.  Daughter describes episode which seems more presyncope nature.  Patient subsequently brought to the ED.  On presentation to the ED patient noted to have soft blood pressures, afebrile, no leukocytosis, urinalysis consistent with a UTI.  Patient placed on antibiotics.  Antihypertensive medications held.     Assessment & Plan:   Principal Problem:   Generalized weakness Active Problems:   Near syncope   Symptomatic bradycardia s/p PPM    CKD stage 3b, GFR 30-44 ml/min (HCC)   Acquired hypothyroidism   Essential hypertension   Dyslipidemia   CAD (coronary artery disease) of artery bypass graft   Dehydration   Paroxysmal atrial fibrillation (HCC)   HLD (hyperlipidemia)   Chronic diastolic heart failure (HCC)   UTI (urinary tract infection)   Hyponatremia  #1 generalized weakness/near syncope -Likely multifactorial secondary to overdiuresis during recent hospitalization in the setting of presumptive UTI, POA. -Patient noted to have soft blood pressure on admission. -Patient's home diuretics and antihypertensive medications held on admission.   -Patient improved clinically, systolic blood pressure improved.   -Urine cultures negative.   -Status post fosfomycin.   -Cardiac medications resumed, diuretics resumed at a lower dose per cardiology. -PT/OT assessed patient recommending SNF placement.   -Continue fall precautions.    2.  Presumptive UTI, POA -Urine cultures  negative.  -Patient afebrile. -Status post fosfomycin. -Supportive care. -No further antibiotics needed.  3.  Chronic HFrEF -Euvolemic on examination. -Continue to hold home regimen of diuretics due to soft blood pressure earlier in the hospitalization which has improved. -Patient noted to have presented with a presyncopal episode. -Patient's Coreg, Entresto, Lasix initially held on admission.   -Patient seen in consultation by cardiology and patient started on Toprol-XL instead of Coreg, patient started on a half home dose of spironolactone of 12.5 mg daily and Lasix 20 mg daily per cardiology on 04/12/2023.   -Patient currently tolerating current cardiac regimen. Addendum-Chest x-ray done earlier on today, 04/14/2023 with cardiomegaly and concerns for pulmonary edema and small pleural effusions, patient with hyponatremia and as such we will consider Lasix 20 mg IV x 1, will have cardiology reassess.  -Cardiology was following and have signed off as of 04/13/2023.   4.  Paroxysmal A-fib on Eliquis -Rate controlled on metoprolol succinate per cardiology recommendations.   -Eliquis for anticoagulation.   5.  Hypothyroidism -Continue Synthroid.    6.  CKD 3B -Stable. -Close to baseline.  7.  CAD status post CABG -Continue home regimen Lipitor and Eliquis. -Patient being followed by cardiology and has been started on beta-blocker metoprolol succinate instead of Coreg to avoid additional blood pressure effects.  Patient also started on half home dose of spironolactone at 12.5 mg daily by cardiology as well as Lasix 20 mg daily.   -Patient tolerating current cardiac regimen. -Per cardiology.  8.  Probable osteoarthritis of the right hip and knee -Continue Tylenol 1000 mg 3 times daily. -Voltaren gel 4 times daily. -Supportive care. -PT/OT.  9.  Hyponatremia -Not clinically  significant. -Patient asymptomatic. -Patient noted to have recently placed back on Lasix but at a lower dose of  20 mg daily. -Check a urea, urine sodium, urine creatinine, urine and serum osmolality, chest x-ray. Addendum-Chest x-ray obtained earlier on today, 04/14/2023 with cardiomegaly with pulmonary edema and small pleural effusions. -May benefit from Lasix 20 mg IV x 1 however will have patient reassessed by cardiology first. --Repeat labs in the a.m.    DVT prophylaxis: Eliquis Code Status: DNR Family Communication: Updated patient.  No family at bedside.   Disposition: Patient medically stable for discharge as of 04/13/2023.  SNF placement.   Status is: Observation The patient remains OBS appropriate and will d/c before 2 midnights.   Consultants:  Cardiology: Dr Jacques Navy 04/10/2023  Procedures:  CT head 04/08/2023 CT abdomen pelvis 04/08/2023  Antimicrobials:  Anti-infectives (From admission, onward)    Start     Dose/Rate Route Frequency Ordered Stop   04/09/23 0915  cefTRIAXone (ROCEPHIN) 2 g in sodium chloride 0.9 % 100 mL IVPB  Status:  Discontinued        2 g 200 mL/hr over 30 Minutes Intravenous Every 24 hours 04/09/23 0909 04/09/23 0933   04/08/23 2245  fosfomycin (MONUROL) packet 3 g        3 g Oral  Once 04/08/23 2232 04/09/23 0035         Subjective: Patient sitting up in chair.  Stated had a rough night with pain control and stated had a bout of shortness of breath when she laid flat.  Denies any current chest pain or shortness of breath.  Right hip and right knee pain currently controlled.  Patient denies any persistent ongoing lightheadedness and dizziness.  Patient stated ambulated in hallway with therapy and initially had some dizziness with which subsequently resolved on ambulation.    Objective: Vitals:   04/14/23 0626 04/14/23 0748 04/14/23 1154 04/14/23 1546  BP: (!) 162/66 (!) 155/66 (!) 149/65 (!) 129/55  Pulse: 62 60 (!) 59 60  Resp:  16 18 19   Temp:  98.4 F (36.9 C) (!) 97.5 F (36.4 C) 98.1 F (36.7 C)  TempSrc:  Oral Oral Oral  SpO2: 95% 99% 100%  98%  Weight:      Height:       No intake or output data in the 24 hours ending 04/14/23 1620  Filed Weights   04/12/23 0514 04/13/23 0412 04/14/23 0413  Weight: 57.2 kg 57.7 kg 55.9 kg    Examination:  General exam: NAD Respiratory system: CTAB.  No wheezes, no crackles, no rhonchi.  Fair air movement.  Speaking in full sentences.  Cardiovascular system: RRR no murmurs rubs or gallops.  No JVD.  No lower extremity edema.   Gastrointestinal system: Abdomen is soft, nontender, nondistended, positive bowel sounds.  No rebound.  No guarding.  Central nervous system: Alert and oriented. No focal neurological deficits. Extremities: Symmetric 5 x 5 power. Skin: No rashes, lesions or ulcers Psychiatry: Judgement and insight appear normal. Mood & affect appropriate.     Data Reviewed: I have personally reviewed following labs and imaging studies  CBC: Recent Labs  Lab 04/08/23 1801 04/09/23 0525 04/10/23 0604 04/13/23 0657 04/14/23 0633  WBC 6.4 5.4 4.3 4.5 6.3  HGB 12.6 12.2 11.3* 10.7* 11.8*  HCT 38.0 37.3 34.2* 32.3* 35.2*  MCV 93.4 89.7 89.3 88.7 89.1  PLT 205 257 195 172 187    Basic Metabolic Panel: Recent Labs  Lab 04/08/23 0222 04/08/23 1801 04/09/23 0630  04/10/23 0604 04/11/23 0751 04/12/23 5784 04/13/23 0657 04/14/23 0633  NA 134*   < > 134* 135 132* 130* 131* 128*  K 3.5   < > 3.7 3.5 3.7 3.6 4.0 4.6  CL 98   < > 101 102 99 99 97* 96*  CO2 27   < > 23 24 23 23 24  21*  GLUCOSE 107*   < > 96 94 132* 88 88 93  BUN 16   < > 17 18 18 16 16 17   CREATININE 1.32*   < > 1.39* 1.22* 1.12* 1.08* 1.07* 0.97  CALCIUM 10.3   < > 10.5* 9.8 10.0 9.6 10.1 10.4*  MG 1.7  --  1.8  --  1.8 1.8 2.3  --   PHOS  --   --  2.4*  --  2.9  --   --   --    < > = values in this interval not displayed.    GFR: Estimated Creatinine Clearance: 26.8 mL/min (by C-G formula based on SCr of 0.97 mg/dL).  Liver Function Tests: Recent Labs  Lab 04/08/23 1918  AST 28  ALT 25   ALKPHOS 66  BILITOT 0.9  PROT 6.5  ALBUMIN 3.7    CBG: No results for input(s): "GLUCAP" in the last 168 hours.   Recent Results (from the past 240 hours)  MRSA Next Gen by PCR, Nasal     Status: None   Collection Time: 04/07/23  3:07 PM   Specimen: Nasal Mucosa; Nasal Swab  Result Value Ref Range Status   MRSA by PCR Next Gen NOT DETECTED NOT DETECTED Final    Comment: (NOTE) The GeneXpert MRSA Assay (FDA approved for NASAL specimens only), is one component of a comprehensive MRSA colonization surveillance program. It is not intended to diagnose MRSA infection nor to guide or monitor treatment for MRSA infections. Test performance is not FDA approved in patients less than 24 years old. Performed at [facility], [address], [place], [place] [address]   Urine Culture     Status: None   Collection Time: 04/09/23  7:27 AM   Specimen: Urine, Random  Result Value Ref Range Status   Specimen Description URINE, RANDOM  Final   Special Requests NONE Reflexed from [id number]  Final   Culture   Final    NO GROWTH Performed at [facility], [address] [place], [place] [address]    Report Status 04/10/2023 FINAL  Final         Radiology Studies: DG CHEST PORT 1 VIEW Result Date: 04/14/2023 CLINICAL DATA:  Shortness of breath. EXAM: PORTABLE CHEST 1 VIEW COMPARISON:  04/05/2018 FINDINGS: Left-sided pacemaker in place. Stable cardiomegaly post CABG. Small pleural effusions are new. Increased interstitial thickening typical of pulmonary edema, new. No confluent airspace disease. No pneumothorax. IMPRESSION: Cardiomegaly with pulmonary edema and small pleural effusions. Electronically Signed   By: Narda Rutherford M.D.   On: 04/14/2023 15:06         Scheduled Meds:  acetaminophen  1,000 mg Oral TID   apixaban  2.5 mg Oral BID   atorvastatin  80 mg Oral QHS   bisacodyl  10 mg Rectal Once   diclofenac Sodium  2 g Topical QID   feeding supplement  237 mL  Oral BID BM   fluticasone  2 spray Each Nare Daily   furosemide  20 mg Oral Daily   levothyroxine  50 mcg Oral QAC breakfast   loratadine  10 mg Oral Daily   metoprolol succinate  12.5 mg Oral Daily   spironolactone  12.5 mg Oral Daily   Continuous Infusions:   LOS: 0 days    Time spent: 35 minutes    Ramiro Harvest, MD [facility]   To contact the attending provider between 7A-7P or the covering provider during after hours 7P-7A, please log into the web site [email or web address] and access using universal [place] password for that web site. If you do not have the password, please call the hospital operator.  04/14/2023, 4:20 PM

## 2023-04-15 DIAGNOSIS — E860 Dehydration: Secondary | ICD-10-CM | POA: Diagnosis not present

## 2023-04-15 DIAGNOSIS — N3000 Acute cystitis without hematuria: Secondary | ICD-10-CM | POA: Diagnosis not present

## 2023-04-15 DIAGNOSIS — I25810 Atherosclerosis of coronary artery bypass graft(s) without angina pectoris: Secondary | ICD-10-CM | POA: Diagnosis not present

## 2023-04-15 DIAGNOSIS — R531 Weakness: Secondary | ICD-10-CM | POA: Diagnosis not present

## 2023-04-15 DIAGNOSIS — I5023 Acute on chronic systolic (congestive) heart failure: Secondary | ICD-10-CM

## 2023-04-15 DIAGNOSIS — E039 Hypothyroidism, unspecified: Secondary | ICD-10-CM | POA: Diagnosis not present

## 2023-04-15 DIAGNOSIS — E782 Mixed hyperlipidemia: Secondary | ICD-10-CM | POA: Diagnosis not present

## 2023-04-15 DIAGNOSIS — J810 Acute pulmonary edema: Secondary | ICD-10-CM

## 2023-04-15 DIAGNOSIS — R55 Syncope and collapse: Secondary | ICD-10-CM | POA: Diagnosis not present

## 2023-04-15 DIAGNOSIS — I4821 Permanent atrial fibrillation: Secondary | ICD-10-CM | POA: Diagnosis not present

## 2023-04-15 DIAGNOSIS — E785 Hyperlipidemia, unspecified: Secondary | ICD-10-CM | POA: Diagnosis not present

## 2023-04-15 DIAGNOSIS — I480 Paroxysmal atrial fibrillation: Secondary | ICD-10-CM | POA: Diagnosis not present

## 2023-04-15 DIAGNOSIS — I5021 Acute systolic (congestive) heart failure: Secondary | ICD-10-CM | POA: Diagnosis not present

## 2023-04-15 LAB — CBC
HCT: 33 % — ABNORMAL LOW (ref 36.0–46.0)
Hemoglobin: 11.1 g/dL — ABNORMAL LOW (ref 12.0–15.0)
MCH: 29.9 pg (ref 26.0–34.0)
MCHC: 33.6 g/dL (ref 30.0–36.0)
MCV: 88.9 fL (ref 80.0–100.0)
Platelets: 174 10*3/uL (ref 150–400)
RBC: 3.71 MIL/uL — ABNORMAL LOW (ref 3.87–5.11)
RDW: 13.8 % (ref 11.5–15.5)
WBC: 4.2 10*3/uL (ref 4.0–10.5)
nRBC: 0 % (ref 0.0–0.2)

## 2023-04-15 LAB — BASIC METABOLIC PANEL
Anion gap: 12 (ref 5–15)
BUN: 22 mg/dL (ref 8–23)
CO2: 23 mmol/L (ref 22–32)
Calcium: 10.2 mg/dL (ref 8.9–10.3)
Chloride: 96 mmol/L — ABNORMAL LOW (ref 98–111)
Creatinine, Ser: 1.01 mg/dL — ABNORMAL HIGH (ref 0.44–1.00)
GFR, Estimated: 51 mL/min — ABNORMAL LOW (ref >60)
Glucose, Bld: 81 mg/dL (ref 70–99)
Potassium: 4.2 mmol/L (ref 3.5–5.1)
Sodium: 131 mmol/L — ABNORMAL LOW (ref 135–145)

## 2023-04-15 LAB — OSMOLALITY, URINE: Osmolality, Ur: 410 mosm/kg (ref 300–900)

## 2023-04-15 LAB — SODIUM, URINE, RANDOM: Sodium, Ur: 29 mmol/L

## 2023-04-15 LAB — CREATININE, URINE, RANDOM: Creatinine, Urine: 101 mg/dL

## 2023-04-15 MED ORDER — MENTHOL 3 MG MT LOZG
1.0000 | LOZENGE | Freq: Once | OROMUCOSAL | Status: AC
  Administered 2023-04-15: 3 mg via ORAL
  Filled 2023-04-15: qty 9

## 2023-04-15 MED ORDER — TORSEMIDE 20 MG PO TABS
20.0000 mg | ORAL_TABLET | Freq: Every day | ORAL | Status: DC
  Administered 2023-04-15: 20 mg via ORAL
  Filled 2023-04-15: qty 1

## 2023-04-15 NOTE — Plan of Care (Signed)

## 2023-04-15 NOTE — Progress Notes (Signed)
 Cardiology Progress Note  Patient ID: Stephanie Atkinson MRN: [id number] DOB: 1924/10/29 Date of Encounter: 04/15/2023 Primary Cardiologist: Nanetta Batty, MD  Subjective   Chief Complaint: SOB  HPI: Breathing and sodium improved with IV Lasix.  She appears fairly euvolemic.  ROS:  All other ROS reviewed and negative. Pertinent positives noted in the HPI.     Telemetry  Overnight telemetry shows V paced 60s, which I personally reviewed.    Physical Exam   Vitals:   04/15/23 0006 04/15/23 0500 04/15/23 0513 04/15/23 0757  BP: (!) 151/67  (!) 134/54 (!) 157/56  Pulse: 60  74 60  Resp: 17  17 17   Temp: 97.8 F (36.6 C)  97.7 F (36.5 C) 98.5 F (36.9 C)  TempSrc:   Oral Oral  SpO2: 100%  100% 99%  Weight:  55.5 kg    Height:       No intake or output data in the 24 hours ending 04/15/23 1019     04/15/2023    5:00 AM 04/14/2023    4:13 AM 04/13/2023    4:12 AM  Last 3 Weights  Weight (lbs) 122 lb 5.7 oz 123 lb 3.8 oz 127 lb 3.3 oz  Weight (kg) 55.5 kg 55.9 kg 57.7 kg    Body mass index is 22.38 kg/m.  General: Well nourished, well developed, in no acute distress Head: Atraumatic, normal size  Eyes: PEERLA, EOMI  Neck: Supple, JVD 5 to 7 cm water Endocrine: No thryomegaly Cardiac: Normal S1, S2; RRR; no murmurs, rubs, or gallops Lungs: Clear to auscultation bilaterally, no wheezing, rhonchi or rales  Abd: Soft, nontender, no hepatomegaly  Ext: No edema, pulses 2+ Musculoskeletal: No deformities, BUE and BLE strength normal and equal Skin: Warm and dry, no rashes   Neuro: Alert and oriented to person, place, time, and situation, CNII-XII grossly intact, no focal deficits  Psych: Normal mood and affect   Cardiac Studies  TTE 01/09/2023  1. Abnormal (paradoxical) septal motion, consistent with RV pacemaker.      . Left ventricular ejection fraction, by estimation, is 35 to 40%. The  left ventricle has moderately decreased function. The left ventricle  demonstrates  global hypokinesis. There is mild left ventricular  hypertrophy. Left ventricular diastolic  parameters are indeterminate.   2. Right ventricular systolic function is normal. The right ventricular  size is normal. There is normal pulmonary artery systolic pressure.   3. Left atrial size was severely dilated.   4. Right atrial size was severely dilated.   5. The mitral valve is degenerative. Trivial mitral valve regurgitation.  No evidence of mitral stenosis.   6. The aortic valve is tricuspid. Aortic valve regurgitation is trivial.  No aortic stenosis is present.   7. Pulmonic valve regurgitation is moderate.   Patient Profile  Stephanie Atkinson is a 89 y.o. female with CAD status post CABG, permanent atrial fibrillation, ischemic cardiomyopathy (EF 35 to 40%), hypertension, hyperlipidemia, heart block status post pacemaker implant admitted on 04/06/2023 for acute on chronic systolic heart failure.  Was diuresed but cardiology asked to reevaluate given worsening shortness of breath and concerns for reaccumulation of fluid.   Assessment & Plan   # Acute on chronic systolic heart failure, EF 35 to 40% # Ischemic cardiomyopathy # Orthostatic hypotension -Volume status and lung exam much improved with one-time dose of Lasix yesterday evening. -Transition to torsemide 20 mg daily. -Has had issues with orthostatic hypotension.  I have ordered compression stockings.  Will continue  metoprolol succinate 12.5 mg daily, Aldactone 12.5 mg daily.  I have added losartan 12.5 mg daily to help complete her heart failure regimen.  Would hold Entresto at discharge.  Will go out on this regimen. -No SGLT2 inhibitor due to recurrent UTI. -I think she can be watched on oral torsemide and if she does okay can go home either this afternoon or tomorrow.  We will arrange follow-up.  # Permanent A-fib # Complete heart block status post PPM -Eliquis 2.5 mg twice daily  # CKD stage IIIa -Stable  # CAD status post  CABG -No angina.  No need for aspirin as she is on Eliquis.  Continue Lipitor.  [facility] will sign off.   Medication Recommendations: As above Other recommendations (labs, testing, etc): None Follow up as an outpatient: 1 to 2 weeks with Dr. Gery Pray  For questions or updates, please contact [facility] Please consult [email or web address] for contact info under     Signed, Gerri Spore T. Flora Lipps, MD, [facility]  [facility]  04/15/2023 10:19 AM

## 2023-04-15 NOTE — TOC Progression Note (Signed)
 Transition of Care [facility]) - Progression Note    Patient Details  Name: Stephanie Atkinson MRN: [id number] Date of Birth: 08/14/1924  Transition of Care [facility]) CM/SW Contact  Stephanie Balo, RN Phone Number: 04/15/2023, 9:53 AM  Clinical Narrative:     [name] went to peer to peer. MD updated with needed information. TOC following.   Expected Discharge Plan: Skilled Nursing Facility Barriers to Discharge: Continued Medical Work up  Expected Discharge Plan and Services   Discharge Planning Services: CM Consult   Living arrangements for the past 2 months: Apartment                                       Social Determinants of Health (SDOH) Interventions SDOH Screenings   Food Insecurity: No Food Insecurity (04/09/2023)  Housing: Low Risk  (04/09/2023)  Transportation Needs: No Transportation Needs (04/09/2023)  Utilities: Not At Risk (04/09/2023)  Financial Resource Strain: Low Risk  (03/04/2023)  Social Connections: Socially Isolated (04/09/2023)  Tobacco Use: High Risk (04/09/2023)    Readmission Risk Interventions    03/11/2023   10:35 AM  Readmission Risk Prevention Plan  Transportation Screening Complete  PCP or Specialist Appt within 5-7 Days Complete  Home Care Screening Complete  Medication Review (RN CM) Complete

## 2023-04-15 NOTE — Progress Notes (Signed)
 Occupational Therapy Treatment Patient Details Name: Stephanie Atkinson MRN: [id number] DOB: 13-Mar-1924 Today's Date: 04/15/2023   History of present illness Pt is a 89 y.o. female recently admitted from 04/06/2023 until 04/08/2023 for acute on chronic HFrEF, who presents on the day of discharge with complaints of generalized weakness 45 minutes after she arrived at her home. Possible overdiuresis, presumptive UTI, euvolemic on exam. PMH: HTN, HLD, CAD s/p CABG, paroxysmal atrial fibrillation, hypothyroidism, CKD stage 3B, MI, syncope, chronic HFrEF, R THA   OT comments  Pt is making solid progress towards their acute OT goals. On arrival, pt was eager to mobilize. Overall she completed all transfers and mobility with generalized supervision A for safety only. She tolerated ~10 minutes of OOB activity without increase in SOB, dizziness or safety concern. OT to continue to follow acutely to facilitate progress towards established goals. Pt will continue to benefit from skilled inpatient follow up therapy, <3 hours/day.       If plan is discharge home, recommend the following:  Assistance with cooking/housework;Direct supervision/assist for medications management;Direct supervision/assist for financial management   Equipment Recommendations  None recommended by OT       Precautions / Restrictions Precautions Precautions: Fall Restrictions Weight Bearing Restrictions Per Provider Order: No       Mobility Bed Mobility               General bed mobility comments: OOB upon arrival    Transfers Overall transfer level: Needs assistance Equipment used: Rolling walker (2 wheels) Transfers: Sit to/from Stand Sit to Stand: Supervision                 Balance Overall balance assessment: Needs assistance Sitting-balance support: No upper extremity supported, Feet supported Sitting balance-Leahy Scale: Good     Standing balance support: No upper extremity supported, During functional  activity Standing balance-Leahy Scale: Fair Standing balance comment: statically                           ADL either performed or assessed with clinical judgement   ADL Overall ADL's : Needs assistance/impaired                         Toilet Transfer: Supervision/safety;Ambulation;Rolling walker (2 wheels) Toilet Transfer Details (indicate cue type and reason): simulated         Functional mobility during ADLs: Supervision/safety;Rolling walker (2 wheels) General ADL Comments: pt declined need to complete ADLs, she tolerated >household distance mobility with RW and demonstrated good safety awareness and RW mgmt    Extremity/Trunk Assessment Upper Extremity Assessment Upper Extremity Assessment: Generalized weakness   Lower Extremity Assessment Lower Extremity Assessment: Defer to PT evaluation        Vision   Vision Assessment?: No apparent visual deficits   Perception Perception Perception: Within Functional Limits   Praxis Praxis Praxis: WFL   Communication Communication Communication: No apparent difficulties   Cognition Arousal: Alert Behavior During Therapy: WFL for tasks assessed/performed Cognition: No apparent impairments             OT - Cognition Comments: Overall WFL for basic tasks adn command following. Pt able to recall appropriate use of rollator vs RW, no safety concerns noted                 Following commands: Intact                 General  Comments VSS, no dizziness    Pertinent Vitals/ Pain       Pain Assessment Pain Assessment: No/denies pain   Frequency  Min 1X/week        Progress Toward Goals  OT Goals(current goals can now be found in the care plan section)  Progress towards OT goals: Progressing toward goals  Acute Rehab OT Goals Patient Stated Goal: to walk OT Goal Formulation: With patient/family Time For Goal Achievement: 04/24/23 Potential to Achieve Goals: Fair ADL Goals Pt  Will Perform Grooming: standing Pt Will Perform Upper Body Bathing: sitting;standing Pt Will Perform Lower Body Bathing: sit to/from stand Pt Will Perform Upper Body Dressing: sitting;standing Pt Will Perform Lower Body Dressing: sit to/from stand Pt Will Transfer to Toilet: regular height toilet;grab bars Pt Will Perform Toileting - Clothing Manipulation and hygiene: sit to/from stand   AM-PAC OT "6 Clicks" Daily Activity     Outcome Measure   Help from another person eating meals?: None Help from another person taking care of personal grooming?: A Little Help from another person toileting, which includes using toliet, bedpan, or urinal?: A Little Help from another person bathing (including washing, rinsing, drying)?: A Little Help from another person to put on and taking off regular upper body clothing?: A Little Help from another person to put on and taking off regular lower body clothing?: A Little 6 Click Score: 19    End of Session Equipment Utilized During Treatment: Rolling walker (2 wheels)  OT Visit Diagnosis: Unsteadiness on feet (R26.81);History of falling (Z91.81)   Activity Tolerance Patient tolerated treatment well   Patient Left in chair;with call bell/phone within reach;with chair alarm set   Nurse Communication Mobility status        Time: 1030-1048 OT Time Calculation (min): 18 min  Charges: OT General Charges $OT Visit: 1 Visit OT Treatments $Therapeutic Activity: 8-22 mins  Derenda Mis, OTR/L Acute Rehabilitation Services Office [phone] Secure Chat Communication Preferred   Donia Pounds 04/15/2023, 10:53 AM

## 2023-04-15 NOTE — Plan of Care (Signed)
  Problem: Education: Goal: Knowledge of General Education information will improve Description: Including pain rating scale, medication(s)/side effects and non-pharmacologic comfort measures Outcome: Progressing   Problem: Clinical Measurements: Goal: Respiratory complications will improve Outcome: Progressing Goal: Cardiovascular complication will be avoided Outcome: Progressing   Problem: Activity: Goal: Risk for activity intolerance will decrease Outcome: Progressing   Problem: Coping: Goal: Level of anxiety will decrease Outcome: Progressing   Problem: Elimination: Goal: Will not experience complications related to bowel motility Outcome: Progressing Goal: Will not experience complications related to urinary retention Outcome: Progressing   Problem: Skin Integrity: Goal: Risk for impaired skin integrity will decrease Outcome: Progressing

## 2023-04-15 NOTE — Progress Notes (Signed)
 PROGRESS NOTE    Stephanie Atkinson  ZOX:[id number] DOB: 15-May-1924 DOA: 04/08/2023 PCP: Linus Galas, NP    Chief Complaint  Patient presents with   Weakness    Brief Narrative:  Patient 89 year old female history of chronic HF R EF, CKD 3B, CAD status post CABG in 2006, hypertension, hyperlipidemia, hypothyroidism, paroxysmal A-fib on Eliquis recently admitted from 04/06/2023-04/08/2023 for acute on chronic HFrEF presenting on day of discharge with complaints of generalized weakness 45 minutes after she arrived at home.  Daughter describes episode which seems more presyncope nature.  Patient subsequently brought to the ED.  On presentation to the ED patient noted to have soft blood pressures, afebrile, no leukocytosis, urinalysis consistent with a UTI.  Patient placed on antibiotics.  Antihypertensive medications held and cardiac medications initially held.  As patient improved clinically cardiac medications were resumed per cardiology recommendations/directions.  Patient noted to go into acute on chronic CHF required IV Lasix and subsequently transition to oral torsemide.  Patient awaiting SNF placement.     Assessment & Plan:   Principal Problem:   Generalized weakness Active Problems:   Acute on chronic systolic CHF (congestive heart failure) (HCC)   Near syncope   Symptomatic bradycardia s/p PPM    CKD stage 3b, GFR 30-44 ml/min (HCC)   Acquired hypothyroidism   Essential hypertension   Dyslipidemia   CAD (coronary artery disease) of artery bypass graft   Dehydration   Paroxysmal atrial fibrillation (HCC)   HLD (hyperlipidemia)   Chronic diastolic heart failure (HCC)   UTI (urinary tract infection)   Acute systolic heart failure (HCC)   Hyponatremia   Acute pulmonary edema (HCC)  #1 generalized weakness/near syncope -Likely multifactorial secondary to overdiuresis during recent hospitalization in the setting of presumptive UTI, POA. -Patient noted to have soft blood pressure on  admission. -Patient's home diuretics and antihypertensive medications held on admission initial.   -Patient improved clinically, systolic blood pressure improved.   -Urine cultures negative.   -Status post fosfomycin.   -Cardiac medications resumed, diuretics resumed at a lower dose per cardiology. -PT/OT assessed patient recommending SNF placement.   -Continue fall precautions.    2.  Presumptive UTI, POA -Urine cultures negative.  -Patient afebrile. -Status post fosfomycin. -Supportive care. -No further antibiotics needed.  3.  Acute on Chronic HFrEF (EF 35-40%) -Patient's home regimen of diuretics initially held on presentation due to soft blood pressure issues.  Blood pressure improved.  -Patient noted to have presented with a presyncopal episode. -Patient's Coreg, Entresto, Lasix initially held on admission.   -Patient seen in consultation by cardiology and patient started on Toprol-XL instead of Coreg, patient started on a half home dose of spironolactone of 12.5 mg daily and Lasix 20 mg daily per cardiology on 04/12/2023.   -Patient noted on 04/14/2023 to have some complaints of orthopnea the night before, some shortness of breath and also noted to be hyponatremic.   -Repeat chest x-ray done 04/14/2023 with cardiomegaly, concerns for pulmonary edema and small effusions.   -Patient reassessed by cardiology, who was in agreement that patient was volume overloaded and patient given Lasix 40 mg IV x 1 with good urine output and clinical improvement as well as improvement with hyponatremia.  -Patient transition to torsemide 20 mg daily per cardiology today 04/15/2023 and cardiology ordered compression stockings as well.  -Cardiology recommended holding Entresto at discharge and patient started on losartan 12.5 mg daily to complete her heart failure regimen.  -No SGLT2 inhibitor due to recurrent UTI.  -  Will monitor on oral torsemide for the next 24 hours and if patient remains stable may be  discharged home with outpatient follow-up with cardiology.    4.  Paroxysmal A-fib on Eliquis -Rate controlled on metoprolol succinate per cardiology recommendations.   -Eliquis for anticoagulation.   5.  Hypothyroidism -Continue Synthroid.    6.  CKD 3B -Stable. -Close to baseline.  7.  CAD status post CABG/ischemic cardiomyopathy -Continue home regimen Lipitor and Eliquis. -Patient being followed by cardiology and has been started on beta-blocker metoprolol succinate instead of Coreg to avoid additional blood pressure effects.  Patient also started on half home dose of spironolactone at 12.5 mg daily by cardiology. -Cardiology recommending holding Entresto and patient started on losartan 12.5 mg daily to help complete her heart failure regimen.  -Patient noted to have received Lasix 40 mg IV x 1 on 04/14/2023, and patient subsequently transition to torsemide 20 mg daily per cardiology today.  -No SGLT2 inhibitor due to recurrent UTI.  -Per cardiology.   8.  Probable osteoarthritis of the right hip and knee -Continue Tylenol 1000 mg 3 times daily. -Voltaren gel 4 times daily. -Supportive care. -PT/OT.  9.  Hyponatremia -Secondary to volume overload. -Patient asymptomatic. -Patient noted to have recently placed back on Lasix but at a lower dose of 20 mg daily. -Chest x-ray done on 04/14/2023 with pulmonary edema and small pleural effusions which were new.   -Urine studies ordered and pending.   -Patient reassessed by cardiology who are in agreement with volume overload patient given Lasix 40 mg IV x 1 and placed back on oral torsemide today per cardiology with improvement with hyponatremia.   -Repeat labs in the AM.     DVT prophylaxis: Eliquis Code Status: DNR Family Communication: Updated patient.  No family at bedside.   Disposition: Patient medically stable for discharge as of 04/13/2023.  SNF placement.   Status is: Observation The patient remains OBS appropriate and  will d/c before 2 midnights.   Consultants:  Cardiology: Dr Jacques Navy 04/10/2023  Procedures:  CT head 04/08/2023 CT abdomen pelvis 04/08/2023  Antimicrobials:  Anti-infectives (From admission, onward)    Start     Dose/Rate Route Frequency Ordered Stop   04/09/23 0915  cefTRIAXone (ROCEPHIN) 2 g in sodium chloride 0.9 % 100 mL IVPB  Status:  Discontinued        2 g 200 mL/hr over 30 Minutes Intravenous Every 24 hours 04/09/23 0909 04/09/23 0933   04/08/23 2245  fosfomycin (MONUROL) packet 3 g        3 g Oral  Once 04/08/23 2232 04/09/23 0035         Subjective: Patient sitting up in chair.  States she feels much better today after receiving IV Lasix yesterday.  Denies any further orthopnea or shortness of breath.  Denies any chest pain.  No dizziness.  Daughter at bedside.  Patient and daughter very thankful for the care they have received.    Objective: Vitals:   04/15/23 0513 04/15/23 0757 04/15/23 1216 04/15/23 1547  BP: (!) 134/54 (!) 157/56 123/61 (!) 109/55  Pulse: 74 60 60 (!) 59  Resp: 17 17 16 16   Temp: 97.7 F (36.5 C) 98.5 F (36.9 C) 98.3 F (36.8 C) 98.4 F (36.9 C)  TempSrc: Oral Oral Oral Oral  SpO2: 100% 99% 99% 98%  Weight:      Height:        Intake/Output Summary (Last 24 hours) at 04/15/2023 1608 Last data filed  at 04/15/2023 1448 Gross per 24 hour  Intake --  Output 160 ml  Net -160 ml    Filed Weights   04/13/23 0412 04/14/23 0413 04/15/23 0500  Weight: 57.7 kg 55.9 kg 55.5 kg    Examination:  General exam: NAD Respiratory system: Lungs clear to auscultation bilaterally.  No wheezes, no crackles, no rhonchi.  Fair air movement.  Speaking in full sentences.  Cardiovascular system: Regular rate rhythm no murmurs rubs or gallops.  No JVD.  No lower extremity edema. Gastrointestinal system: Abdomen is soft, nontender, nondistended, positive bowel sounds.  No rebound.  No guarding.  Central nervous system: Alert and oriented. No focal  neurological deficits. Extremities: Symmetric 5 x 5 power. Skin: No rashes, lesions or ulcers Psychiatry: Judgement and insight appear normal. Mood & affect appropriate.     Data Reviewed: I have personally reviewed following labs and imaging studies  CBC: Recent Labs  Lab 04/09/23 0525 04/10/23 0604 04/13/23 0657 04/14/23 0633 04/15/23 0542  WBC 5.4 4.3 4.5 6.3 4.2  HGB 12.2 11.3* 10.7* 11.8* 11.1*  HCT 37.3 34.2* 32.3* 35.2* 33.0*  MCV 89.7 89.3 88.7 89.1 88.9  PLT 257 195 172 187 174    Basic Metabolic Panel: Recent Labs  Lab 04/09/23 0630 04/10/23 0604 04/11/23 0751 04/12/23 0613 04/13/23 0657 04/14/23 0633 04/15/23 0542  NA 134*   < > 132* 130* 131* 128* 131*  K 3.7   < > 3.7 3.6 4.0 4.6 4.2  CL 101   < > 99 99 97* 96* 96*  CO2 23   < > 23 23 24  21* 23  GLUCOSE 96   < > 132* 88 88 93 81  BUN 17   < > 18 16 16 17 22   CREATININE 1.39*   < > 1.12* 1.08* 1.07* 0.97 1.01*  CALCIUM 10.5*   < > 10.0 9.6 10.1 10.4* 10.2  MG 1.8  --  1.8 1.8 2.3  --   --   PHOS 2.4*  --  2.9  --   --   --   --    < > = values in this interval not displayed.    GFR: Estimated Creatinine Clearance: 25.8 mL/min (A) (by C-G formula based on SCr of 1.01 mg/dL (H)).  Liver Function Tests: Recent Labs  Lab 04/08/23 1918  AST 28  ALT 25  ALKPHOS 66  BILITOT 0.9  PROT 6.5  ALBUMIN 3.7    CBG: No results for input(s): "GLUCAP" in the last 168 hours.   Recent Results (from the past 240 hours)  MRSA Next Gen by PCR, Nasal     Status: None   Collection Time: 04/07/23  3:07 PM   Specimen: Nasal Mucosa; Nasal Swab  Result Value Ref Range Status   MRSA by PCR Next Gen NOT DETECTED NOT DETECTED Final    Comment: (NOTE) The GeneXpert MRSA Assay (FDA approved for NASAL specimens only), is one component of a comprehensive MRSA colonization surveillance program. It is not intended to diagnose MRSA infection nor to guide or monitor treatment for MRSA infections. Test performance is  not FDA approved in patients less than 67 years old. Performed at [facility], [address] [place], [place] [address]   Urine Culture     Status: None   Collection Time: 04/09/23  7:27 AM   Specimen: Urine, Random  Result Value Ref Range Status   Specimen Description URINE, RANDOM  Final   Special Requests NONE Reflexed from  [id number]  Final   Culture   Final    NO GROWTH Performed at [facility], [address], [place], [place] [address]    Report Status 04/10/2023 FINAL  Final         Radiology Studies: DG CHEST PORT 1 VIEW Result Date: 04/14/2023 CLINICAL DATA:  Shortness of breath. EXAM: PORTABLE CHEST 1 VIEW COMPARISON:  04/05/2018 FINDINGS: Left-sided pacemaker in place. Stable cardiomegaly post CABG. Small pleural effusions are new. Increased interstitial thickening typical of pulmonary edema, new. No confluent airspace disease. No pneumothorax. IMPRESSION: Cardiomegaly with pulmonary edema and small pleural effusions. Electronically Signed   By: Narda Rutherford M.D.   On: 04/14/2023 15:06         Scheduled Meds:  acetaminophen  1,000 mg Oral TID   apixaban  2.5 mg Oral BID   atorvastatin  80 mg Oral QHS   bisacodyl  10 mg Rectal Once   diclofenac Sodium  2 g Topical QID   feeding supplement  237 mL Oral BID BM   fluticasone  2 spray Each Nare Daily   levothyroxine  50 mcg Oral QAC breakfast   loratadine  10 mg Oral Daily   losartan  12.5 mg Oral Daily   metoprolol succinate  12.5 mg Oral Daily   spironolactone  12.5 mg Oral Daily   torsemide  20 mg Oral Daily   Continuous Infusions:   LOS: 0 days    Time spent: 40 minutes    Ramiro Harvest, MD [facility]   To contact the attending provider between 7A-7P or the covering provider during after hours 7P-7A, please log into the web site [email or web address] and access using universal [place] password for that web site. If you do not have the password, please call the hospital  operator.  04/15/2023, 4:08 PM

## 2023-04-16 DIAGNOSIS — R531 Weakness: Secondary | ICD-10-CM | POA: Diagnosis not present

## 2023-04-16 LAB — CBC
HCT: 33.4 % — ABNORMAL LOW (ref 36.0–46.0)
Hemoglobin: 11.1 g/dL — ABNORMAL LOW (ref 12.0–15.0)
MCH: 29.4 pg (ref 26.0–34.0)
MCHC: 33.2 g/dL (ref 30.0–36.0)
MCV: 88.4 fL (ref 80.0–100.0)
Platelets: 175 10*3/uL (ref 150–400)
RBC: 3.78 MIL/uL — ABNORMAL LOW (ref 3.87–5.11)
RDW: 13.6 % (ref 11.5–15.5)
WBC: 4.2 10*3/uL (ref 4.0–10.5)
nRBC: 0 % (ref 0.0–0.2)

## 2023-04-16 LAB — BASIC METABOLIC PANEL
Anion gap: 10 (ref 5–15)
BUN: 25 mg/dL — ABNORMAL HIGH (ref 8–23)
CO2: 24 mmol/L (ref 22–32)
Calcium: 10.1 mg/dL (ref 8.9–10.3)
Chloride: 95 mmol/L — ABNORMAL LOW (ref 98–111)
Creatinine, Ser: 1.15 mg/dL — ABNORMAL HIGH (ref 0.44–1.00)
GFR, Estimated: 44 mL/min — ABNORMAL LOW (ref >60)
Glucose, Bld: 89 mg/dL (ref 70–99)
Potassium: 4 mmol/L (ref 3.5–5.1)
Sodium: 129 mmol/L — ABNORMAL LOW (ref 135–145)

## 2023-04-16 LAB — UREA NITROGEN, URINE: Urea Nitrogen, Ur: 616 mg/dL

## 2023-04-16 LAB — MAGNESIUM: Magnesium: 2 mg/dL (ref 1.7–2.4)

## 2023-04-16 MED ORDER — SODIUM CHLORIDE 0.9 % IV BOLUS
500.0000 mL | Freq: Once | INTRAVENOUS | Status: AC
  Administered 2023-04-16: 500 mL via INTRAVENOUS

## 2023-04-16 NOTE — TOC Progression Note (Addendum)
 Transition of Care [facility]) - Progression Note    Patient Details  Name: Stephanie Atkinson MRN: [id number] Date of Birth: 1925-01-19  Transition of [facility]) CM/SW Contact  Kermit Balo, RN Phone Number: 04/16/2023, 8:31 AM  Clinical Narrative:     Pt was denied for SNF due to [employer] not receiving documents from [facility] prior to deadline even though their system was the issue. CM has initiated an expedited appeal through [facility] per daughter request. Francine Graven has up to 72 hours to decide on appeal.  Appeal ref # [id number] TOC following.  1334: CM has confirmed receipt of expedited appeal information by [facility]. The pending auth: [id number] Ref #: [id number]  Expected Discharge Plan: Skilled Nursing Facility Barriers to Discharge: Continued Medical Work up  Expected Discharge Plan and Services   Discharge Planning Services: CM Consult   Living arrangements for the past 2 months: Apartment                                       Social Determinants of Health (SDOH) Interventions SDOH Screenings   Food Insecurity: No Food Insecurity (04/09/2023)  Housing: Low Risk  (04/09/2023)  Transportation Needs: No Transportation Needs (04/09/2023)  Utilities: Not At Risk (04/09/2023)  Financial Resource Strain: Low Risk  (03/04/2023)  Social Connections: Socially Isolated (04/09/2023)  Tobacco Use: High Risk (04/09/2023)    Readmission Risk Interventions    03/11/2023   10:35 AM  Readmission Risk Prevention Plan  Transportation Screening Complete  PCP or Specialist Appt within 5-7 Days Complete  Home Care Screening Complete  Medication Review (RN CM) Complete

## 2023-04-16 NOTE — Progress Notes (Signed)
  Progress Note   Patient: Stephanie Atkinson [id number] DOB: 12/24/1924 DOA: 04/08/2023     0 DOS: the patient was seen and examined on 04/16/2023   Brief hospital course: Patient 89 year old female history of chronic HF R EF, CKD 3B, CAD status post CABG in 2006, hypertension, hyperlipidemia, hypothyroidism, paroxysmal A-fib on Eliquis recently admitted from 04/06/2023-04/08/2023 for acute on chronic HFrEF presenting on day of discharge with complaints of generalized weakness 45 minutes after she arrived at home.  Daughter describes episode which seems more presyncope nature.  Patient subsequently brought to the ED.  On presentation to the ED patient noted to have soft blood pressures, afebrile, no leukocytosis, urinalysis consistent with a UTI.  Patient placed on antibiotics.  Antihypertensive medications held and cardiac medications initially held.  As patient improved clinically cardiac medications were resumed per cardiology recommendations/directions.  Patient noted to go into acute on chronic CHF required IV Lasix and subsequently transition to oral torsemide.  Patient awaiting SNF placement.    Assessment and Plan: Generalized weakness  - Awaiting placement  - Ensure enlive 237 mL PO bid   UTI  - Completed antibx   Acute on chronic HFpEF  - Losartan 12.5 mg PO daily   PAF  - Eliquis 2.5 mg PO bid  - Toprol XL 12.5 mg PO daily   Hypothyroidism  - Synthroid 50 mcg PO daily   CKD 3b - Monitor  - Hold diuretics as BUN/Cr rising  - 1x 500 mL NS bolus   CAD  - Lipitor 80 mg PO at bedtime   OA - Tylenol PRN  - Voltaren gel 4 times daily   Subjective: Pt seen and examined at the bedside. BUN/Cr is rising and diuretics stopped. 1 x 500 cc bolus ordered. Disposition per CM.  Physical Exam: Vitals:   04/16/23 0410 04/16/23 0500 04/16/23 0851 04/16/23 1206  BP: (!) 175/74  (!) 169/64 (!) 138/57  Pulse: 75  61 (!) 57  Resp: 18     Temp: 98.6 F (37 C)  97.7 F (36.5 C)    TempSrc: Oral  Oral   SpO2: 100%  100% 100%  Weight:  57.1 kg    Height:       Physical Exam HENT:     Head: Normocephalic.  Cardiovascular:     Rate and Rhythm: Normal rate and regular rhythm.  Pulmonary:     Effort: Pulmonary effort is normal.  Abdominal:     Palpations: Abdomen is soft.  Musculoskeletal:        General: Normal range of motion.     Cervical back: Neck supple.  Skin:    General: Skin is warm.  Neurological:     Mental Status: She is alert. Mental status is at baseline.  Psychiatric:        Mood and Affect: Mood normal.       Disposition: Status is: Observation The patient remains OBS appropriate and will d/c before 2 midnights.  Planned Discharge Destination: Skilled nursing facility    Time spent: 35 minutes  Author: Baron Hamper , MD 04/16/2023 12:45 PM  For on call review [email or web address].

## 2023-04-16 NOTE — Plan of Care (Signed)
  Problem: Education: Goal: Knowledge of General Education information will improve Description: Including pain rating scale, medication(s)/side effects and non-pharmacologic comfort measures Outcome: Progressing   Problem: Clinical Measurements: Goal: Respiratory complications will improve Outcome: Progressing Goal: Cardiovascular complication will be avoided Outcome: Progressing   Problem: Activity: Goal: Risk for activity intolerance will decrease Outcome: Progressing   Problem: Nutrition: Goal: Adequate nutrition will be maintained Outcome: Progressing   Problem: Coping: Goal: Level of anxiety will decrease Outcome: Progressing   Problem: Elimination: Goal: Will not experience complications related to bowel motility Outcome: Progressing Goal: Will not experience complications related to urinary retention Outcome: Progressing   Problem: Skin Integrity: Goal: Risk for impaired skin integrity will decrease Outcome: Progressing

## 2023-04-17 DIAGNOSIS — R531 Weakness: Secondary | ICD-10-CM | POA: Diagnosis not present

## 2023-04-17 LAB — COMPREHENSIVE METABOLIC PANEL
ALT: 24 U/L (ref 0–44)
AST: 28 U/L (ref 15–41)
Albumin: 3.5 g/dL (ref 3.5–5.0)
Alkaline Phosphatase: 51 U/L (ref 38–126)
Anion gap: 10 (ref 5–15)
BUN: 20 mg/dL (ref 8–23)
CO2: 21 mmol/L — ABNORMAL LOW (ref 22–32)
Calcium: 10.4 mg/dL — ABNORMAL HIGH (ref 8.9–10.3)
Chloride: 99 mmol/L (ref 98–111)
Creatinine, Ser: 0.98 mg/dL (ref 0.44–1.00)
GFR, Estimated: 53 mL/min — ABNORMAL LOW (ref >60)
Glucose, Bld: 92 mg/dL (ref 70–99)
Potassium: 4.2 mmol/L (ref 3.5–5.1)
Sodium: 130 mmol/L — ABNORMAL LOW (ref 135–145)
Total Bilirubin: 1.4 mg/dL — ABNORMAL HIGH (ref 0.0–1.2)
Total Protein: 5.9 g/dL — ABNORMAL LOW (ref 6.5–8.1)

## 2023-04-17 LAB — CBC
HCT: 34.1 % — ABNORMAL LOW (ref 36.0–46.0)
Hemoglobin: 11.4 g/dL — ABNORMAL LOW (ref 12.0–15.0)
MCH: 29.6 pg (ref 26.0–34.0)
MCHC: 33.4 g/dL (ref 30.0–36.0)
MCV: 88.6 fL (ref 80.0–100.0)
Platelets: 185 10*3/uL (ref 150–400)
RBC: 3.85 MIL/uL — ABNORMAL LOW (ref 3.87–5.11)
RDW: 13.8 % (ref 11.5–15.5)
WBC: 4.2 10*3/uL (ref 4.0–10.5)
nRBC: 0 % (ref 0.0–0.2)

## 2023-04-17 MED ORDER — ZOLPIDEM TARTRATE 5 MG PO TABS
2.5000 mg | ORAL_TABLET | Freq: Every evening | ORAL | Status: DC | PRN
  Administered 2023-04-17 – 2023-04-23 (×8): 2.5 mg via ORAL
  Filled 2023-04-17 (×8): qty 1

## 2023-04-17 MED ORDER — TRAMADOL HCL 50 MG PO TABS
25.0000 mg | ORAL_TABLET | Freq: Four times a day (QID) | ORAL | Status: DC | PRN
  Administered 2023-04-17 – 2023-04-20 (×8): 25 mg via ORAL
  Filled 2023-04-17 (×8): qty 1

## 2023-04-17 MED ORDER — HYDROMORPHONE HCL 1 MG/ML IJ SOLN: 0.5000 mg | Freq: Once | INTRAMUSCULAR | Status: DC

## 2023-04-17 NOTE — Progress Notes (Signed)
 Physical Therapy Treatment Patient Details Name: Stephanie Atkinson MRN: [id number] DOB: March 15, 1924 Today's Date: 04/17/2023   History of Present Illness Pt is a 89 y.o. female recently admitted from 04/06/2023 until 04/08/2023 for acute on chronic HFrEF, who presents on the day of discharge with complaints of generalized weakness 45 minutes after she arrived at her home. Possible overdiuresis, presumptive UTI, euvolemic on exam. PMH: HTN, HLD, CAD s/p CABG, paroxysmal atrial fibrillation, hypothyroidism, CKD stage 3B, MI, syncope, chronic HFrEF, R THA    PT Comments  Pt resting in bed on arrival and agreeable to session. Pt reporting no dizziness throughout session and demonstrating continues progress towards acute goals. Pt continues to require cues and education on safe rollator use throughout all mobility. Physically pt requiring up to min A with mobility due to weakness and decreased activity tolerance. Current plan remains appropriate to address deficits and maximize functional independence and decrease caregiver burden. Pt continues to benefit from skilled PT services to progress toward functional mobility goals.      If plan is discharge home, recommend the following: Assist for transportation;Assistance with cooking/housework;A lot of help with walking and/or transfers;Help with stairs or ramp for entrance   Can travel by private vehicle     Yes  Equipment Recommendations  None recommended by PT    Recommendations for Other Services       Precautions / Restrictions Precautions Precautions: Fall Restrictions Weight Bearing Restrictions Per Provider Order: No     Mobility  Bed Mobility Overal bed mobility: Modified Independent                  Transfers Overall transfer level: Needs assistance Equipment used: Rolling walker (2 wheels) Transfers: Sit to/from Stand Sit to Stand: Supervision, Min assist           General transfer comment: supervision from EOB, min A  from low commode    Ambulation/Gait Ambulation/Gait assistance: Supervision Gait Distance (Feet): 15 Feet (+ 140) Assistive device: Rolling walker (2 wheels) Gait Pattern/deviations: Step-through pattern, Decreased stride length, Trunk flexed Gait velocity: decr     General Gait Details: supervision for safety, cues for safety and to keep rollator with her when returning to bed, as pt trying again to "park" rollator 5' from [facility]   Stairs             Wheelchair Mobility     Tilt Bed    Modified Rankin (Stroke Patients Only)       Balance Overall balance assessment: Needs assistance Sitting-balance support: No upper extremity supported, Feet supported Sitting balance-Leahy Scale: Good     Standing balance support: No upper extremity supported, During functional activity Standing balance-Leahy Scale: Fair Standing balance comment: statically                            Communication Communication Communication: No apparent difficulties  Cognition Arousal: Alert Behavior During Therapy: WFL for tasks assessed/performed   PT - Cognitive impairments: Memory                       PT - Cognition Comments: required cues to lock brakes prior to sitting/standing x2 during session Following commands: Intact      Cueing Cueing Techniques: Verbal cues  Exercises      General Comments General comments (skin integrity, edema, etc.): VSS      Pertinent Vitals/Pain Pain Assessment Pain Assessment: Faces Faces Pain Scale: Hurts  even more Pain Location: bil knees, ankles, low back, hips Pain Descriptors / Indicators: Aching, Sore Pain Intervention(s): Premedicated before session, Monitored during session, Limited activity within patient's tolerance    Home Living                          Prior Function            PT Goals (current goals can now be found in the care plan section) Acute Rehab PT Goals Patient Stated Goal: to not  fall PT Goal Formulation: With patient/family Time For Goal Achievement: 04/24/23 Progress towards PT goals: Progressing toward goals    Frequency    Min 1X/week      PT Plan      Co-evaluation              AM-PAC PT "6 Clicks" Mobility   Outcome Measure  Help needed turning from your back to your side while in a flat bed without using bedrails?: None Help needed moving from lying on your back to sitting on the side of a flat bed without using bedrails?: None Help needed moving to and from a bed to a chair (including a wheelchair)?: A Little Help needed standing up from a chair using your arms (e.g., wheelchair or bedside chair)?: A Little Help needed to walk in hospital room?: A Little Help needed climbing 3-5 steps with a railing? : A Little 6 Click Score: 20    End of Session Equipment Utilized During Treatment: Gait belt Activity Tolerance: Patient tolerated treatment well Patient left: with call bell/phone within reach;in bed;with bed alarm set;Other (comment) (seated up EOB) Nurse Communication: Mobility status PT Visit Diagnosis: Other abnormalities of gait and mobility (R26.89);History of falling (Z91.81)     Time: 1511-1540 PT Time Calculation (min) (ACUTE ONLY): 29 min  Charges:    $Gait Training: 8-22 mins $Therapeutic Activity: 8-22 mins PT General Charges $$ ACUTE PT VISIT: 1 Visit                     Alzada Brazee R. PTA Acute Rehabilitation Services Office: [phone]   Catalina Antigua 04/17/2023, 4:05 PM

## 2023-04-17 NOTE — Plan of Care (Signed)

## 2023-04-17 NOTE — TOC Progression Note (Signed)
 Transition of Care [facility]) - Progression Note    Patient Details  Name: Stephanie Atkinson MRN: [id number] Date of Birth: 04/23/1924  [facility]) CM/SW Contact  Lockie Pares, RN Phone Number: 04/17/2023, 11:30 AM  Clinical Narrative:     Received message from [facility] regarding appeal, that further paperwork was needed. Called [employer] and spoke to two different people regarding appeal. They state nothing further needed, the appeal is in progress . Call tomorrow for update [phone]  Expected Discharge Plan: Skilled Nursing Facility Barriers to Discharge: Continued Medical Work up  Expected Discharge Plan and Services   Discharge Planning Services: CM Consult   Living arrangements for the past 2 months: Apartment                                       Social Determinants of Health (SDOH) Interventions SDOH Screenings   Food Insecurity: No Food Insecurity (04/09/2023)  Housing: Low Risk  (04/09/2023)  Transportation Needs: No Transportation Needs (04/09/2023)  Utilities: Not At Risk (04/09/2023)  Financial Resource Strain: Low Risk  (03/04/2023)  Social Connections: Socially Isolated (04/09/2023)  Tobacco Use: High Risk (04/09/2023)    Readmission Risk Interventions    03/11/2023   10:35 AM  Readmission Risk Prevention Plan  Transportation Screening Complete  PCP or Specialist Appt within 5-7 Days Complete  Home Care Screening Complete  Medication Review (RN CM) Complete

## 2023-04-17 NOTE — Progress Notes (Signed)
  Progress Note   Patient: Stephanie Atkinson ZOX:[id number] DOB: [birth year] DOA: 04/08/2023     0 DOS: the patient was seen and examined on 04/17/2023   Brief hospital course: Patient 89 year old female history of chronic HF R EF, CKD 3B, CAD status post CABG in 2006, hypertension, hyperlipidemia, hypothyroidism, paroxysmal A-fib on Eliquis recently admitted from 04/06/2023-04/08/2023 for acute on chronic HFrEF presenting on day of discharge with complaints of generalized weakness 45 minutes after she arrived at home. Daughter describes episode which seems more presyncope nature. Patient subsequently brought to the ED. On presentation to the ED patient noted to have soft blood pressures, afebrile, no leukocytosis, urinalysis consistent with a UTI. Patient placed on antibiotics. Antihypertensive medications held and cardiac medications initially held. As patient improved clinically cardiac medications were resumed per cardiology recommendations/directions. Patient noted to go into acute on chronic CHF required IV Lasix and subsequently transition to oral torsemide. Patient awaiting SNF placement.   Assessment and Plan: Generalized weakness  - Awaiting placement  - Ensure enlive 237 mL PO bid    UTI  - Completed antibx    Acute on chronic HFpEF  - Losartan 12.5 mg PO daily    PAF  - Eliquis 2.5 mg PO bid  - Toprol XL 12.5 mg PO daily    Hypothyroidism  - Synthroid 50 mcg PO daily    CKD 3b - Monitor  - Hold diuretics as BUN/Cr rising  - 1x 500 mL NS bolus    CAD  - Lipitor 80 mg PO at bedtime    OA - Tylenol PRN  - Voltaren gel 4 times daily  - Tramadol 25 mg PO q6 hr PRN  Subjective: Pt seen and examined at the bedside. BUN/Cr have corrected from the IV fluids yesterday. Tramadol PRN ordered for arthritis pain as tylenol is not working.  Physical Exam: Vitals:   04/17/23 0002 04/17/23 0415 04/17/23 0500 04/17/23 0803  BP: (!) 160/63 (!) 166/70  (!) 168/82  Pulse: 61 63  64  Resp:  17 18  19   Temp: (!) 97.5 F (36.4 C) 98 F (36.7 C)  (!) 97.5 F (36.4 C)  TempSrc:    Oral  SpO2: 98% 96%  98%  Weight:   56.9 kg   Height:       HENT:     Head: Normocephalic.  Cardiovascular:     Rate and Rhythm: Normal rate and regular rhythm.  Pulmonary:     Effort: Pulmonary effort is normal.  Abdominal:     Palpations: Abdomen is soft.  Musculoskeletal:        General: Normal range of motion.     Cervical back: Neck supple.  Skin:    General: Skin is warm.  Neurological:     Mental Status: She is alert. Mental status is at baseline.  Psychiatric:        Mood and Affect: Mood normal.     Disposition: Status is: Observation The patient remains OBS appropriate and will d/c before 2 midnights.  Planned Discharge Destination:  Dispo per pt's clinical progress    Time spent: 35 minutes  Author: Baron Hamper , MD 04/17/2023 11:08 AM  For on call review [email or web address].

## 2023-04-18 DIAGNOSIS — R531 Weakness: Secondary | ICD-10-CM | POA: Diagnosis not present

## 2023-04-18 LAB — CBC
HCT: 32.7 % — ABNORMAL LOW (ref 36.0–46.0)
Hemoglobin: 10.9 g/dL — ABNORMAL LOW (ref 12.0–15.0)
MCH: 29.4 pg (ref 26.0–34.0)
MCHC: 33.3 g/dL (ref 30.0–36.0)
MCV: 88.1 fL (ref 80.0–100.0)
Platelets: 187 10*3/uL (ref 150–400)
RBC: 3.71 MIL/uL — ABNORMAL LOW (ref 3.87–5.11)
RDW: 13.8 % (ref 11.5–15.5)
WBC: 3.4 10*3/uL — ABNORMAL LOW (ref 4.0–10.5)
nRBC: 0 % (ref 0.0–0.2)

## 2023-04-18 MED ORDER — DICLOFENAC SODIUM 1 % EX GEL
2.0000 g | Freq: Four times a day (QID) | CUTANEOUS | Status: AC
  Administered 2023-04-18 – 2023-04-22 (×5): 2 g via TOPICAL

## 2023-04-18 NOTE — Plan of Care (Signed)

## 2023-04-18 NOTE — Plan of Care (Signed)

## 2023-04-18 NOTE — Progress Notes (Signed)
  Progress Note   Patient: Stephanie Atkinson ZOX:[id number] DOB: 03-18-1924 DOA: 04/08/2023     0 DOS: the patient was seen and examined on 04/18/2023   Brief hospital course: Patient 89 year old female history of chronic HF R EF, CKD 3B, CAD status post CABG in 2006, hypertension, hyperlipidemia, hypothyroidism, paroxysmal A-fib on Eliquis recently admitted from 04/06/2023-04/08/2023 for acute on chronic HFrEF presenting on day of discharge with complaints of generalized weakness 45 minutes after she arrived at home. Daughter describes episode which seems more presyncope nature. Patient subsequently brought to the ED. On presentation to the ED patient noted to have soft blood pressures, afebrile, no leukocytosis, urinalysis consistent with a UTI. Patient placed on antibiotics. Antihypertensive medications held and cardiac medications initially held. As patient improved clinically cardiac medications were resumed per cardiology recommendations/directions. Patient noted to go into acute on chronic CHF required IV Lasix and subsequently transition to oral torsemide. Patient awaiting SNF placement.   Assessment and Plan: Generalized weakness  - Awaiting placement  - Ensure enlive 237 mL PO bid    UTI  - Completed antibx    Acute on chronic HFpEF  - Losartan 12.5 mg PO daily    PAF  - Eliquis 2.5 mg PO bid  - Toprol XL 12.5 mg PO daily    Hypothyroidism  - Synthroid 50 mcg PO daily    CKD 3b - Monitor    CAD  - Lipitor 80 mg PO at bedtime    OA - Tylenol PRN  - Voltaren gel 4 times daily  - Tramadol 25 mg PO q6 hr PRN  Subjective: Pt seen and examined at the bedside. Pt awaits SNF.   Physical Exam: Vitals:   04/18/23 0411 04/18/23 0500 04/18/23 0857 04/18/23 1119  BP: (!) 154/60  (!) 152/51 (!) 142/55  Pulse: 61  (!) 58 61  Resp: 16  16 16   Temp: (!) 97.4 F (36.3 C)  98.6 F (37 C) (!) 97.3 F (36.3 C)  TempSrc: Oral  Oral Oral  SpO2: 99%   99%  Weight:  56.6 kg    Height:        HENT:     Head: Normocephalic.  Cardiovascular:     Rate and Rhythm: Normal rate and regular rhythm.  Pulmonary:     Effort: Pulmonary effort is normal.  Abdominal:     Palpations: Abdomen is soft.  Musculoskeletal:        General: Normal range of motion.     Cervical back: Neck supple.  Skin:    General: Skin is warm.  Neurological:     Mental Status: She is alert. Mental status is at baseline.  Psychiatric:        Mood and Affect: Mood normal.      Disposition: Status is: Observation The patient remains OBS appropriate and will d/c before 2 midnights.  Planned Discharge Destination: Skilled nursing facility    Time spent: 35 minutes  Author: Baron Hamper , MD 04/18/2023 12:57 PM  For on call review [email or web address].

## 2023-04-18 NOTE — TOC Progression Note (Signed)
 Transition of Care [facility]) - Progression Note    Patient Details  Name: Stephanie Atkinson MRN: [id number] Date of Birth: 19-Aug-1924  Transition of Care [facility]) CM/SW Contact  Baldemar Lenis, [place] Phone Number: 04/18/2023, 10:20 AM  Clinical Narrative:   CSW requested CMA to contact insurance to check on status of appeal, [employer] has until this afternoon to make determination. Appeal is still under review at this time. CSW to follow.    Expected Discharge Plan: Skilled Nursing Facility Barriers to Discharge: Insurance Authorization  Expected Discharge Plan and Services   Discharge Planning Services: CM Consult   Living arrangements for the past 2 months: Apartment                                       Social Determinants of Health (SDOH) Interventions SDOH Screenings   Food Insecurity: No Food Insecurity (04/09/2023)  Housing: Low Risk  (04/09/2023)  Transportation Needs: No Transportation Needs (04/09/2023)  Utilities: Not At Risk (04/09/2023)  Financial Resource Strain: Low Risk  (03/04/2023)  Social Connections: Socially Isolated (04/09/2023)  Tobacco Use: High Risk (04/09/2023)    Readmission Risk Interventions    03/11/2023   10:35 AM  Readmission Risk Prevention Plan  Transportation Screening Complete  PCP or Specialist Appt within 5-7 Days Complete  Home Care Screening Complete  Medication Review (RN CM) Complete

## 2023-04-18 NOTE — TOC Progression Note (Addendum)
 Transition of Care [facility]) - Progression Note    Patient Details  Name: Stephanie Atkinson MRN: [id number] Date of Birth: 1924/08/11  [facility]) CM/SW Contact  Lockie Pares, RN Phone Number: 04/18/2023, 4:40 PM  Clinical Narrative:     Called to check on appeal  for SNF.[name]  Case [id number]. Spoke to [place] at  [place].  She stated that the appeal is good until April 30, 2023, discussed with her that we expedited this as the patient is currently in the hospital.  There is no update as of this time. She states if we do not hear anything by Monday afternoon to call back.   Expected Discharge Plan: Skilled Nursing Facility Barriers to Discharge: Insurance Authorization  Expected Discharge Plan and Services   Discharge Planning Services: CM Consult   Living arrangements for the past 2 months: Apartment                                       Social Determinants of Health (SDOH) Interventions SDOH Screenings   Food Insecurity: No Food Insecurity (04/09/2023)  Housing: Low Risk  (04/09/2023)  Transportation Needs: No Transportation Needs (04/09/2023)  Utilities: Not At Risk (04/09/2023)  Financial Resource Strain: Low Risk  (03/04/2023)  Social Connections: Socially Isolated (04/09/2023)  Tobacco Use: High Risk (04/09/2023)    Readmission Risk Interventions    03/11/2023   10:35 AM  Readmission Risk Prevention Plan  Transportation Screening Complete  PCP or Specialist Appt within 5-7 Days Complete  Home Care Screening Complete  Medication Review (RN CM) Complete

## 2023-04-19 DIAGNOSIS — N390 Urinary tract infection, site not specified: Secondary | ICD-10-CM

## 2023-04-19 DIAGNOSIS — N179 Acute kidney failure, unspecified: Secondary | ICD-10-CM

## 2023-04-19 DIAGNOSIS — I10 Essential (primary) hypertension: Secondary | ICD-10-CM | POA: Diagnosis not present

## 2023-04-19 DIAGNOSIS — R531 Weakness: Secondary | ICD-10-CM | POA: Diagnosis not present

## 2023-04-19 DIAGNOSIS — J810 Acute pulmonary edema: Secondary | ICD-10-CM | POA: Diagnosis not present

## 2023-04-19 DIAGNOSIS — I5023 Acute on chronic systolic (congestive) heart failure: Secondary | ICD-10-CM | POA: Diagnosis not present

## 2023-04-19 DIAGNOSIS — R001 Bradycardia, unspecified: Secondary | ICD-10-CM | POA: Diagnosis not present

## 2023-04-19 DIAGNOSIS — N1832 Chronic kidney disease, stage 3b: Secondary | ICD-10-CM | POA: Diagnosis not present

## 2023-04-19 DIAGNOSIS — R55 Syncope and collapse: Secondary | ICD-10-CM | POA: Diagnosis not present

## 2023-04-19 LAB — CBC
HCT: 32 % — ABNORMAL LOW (ref 36.0–46.0)
Hemoglobin: 10.4 g/dL — ABNORMAL LOW (ref 12.0–15.0)
MCH: 29.5 pg (ref 26.0–34.0)
MCHC: 32.5 g/dL (ref 30.0–36.0)
MCV: 90.7 fL (ref 80.0–100.0)
Platelets: 178 10*3/uL (ref 150–400)
RBC: 3.53 MIL/uL — ABNORMAL LOW (ref 3.87–5.11)
RDW: 14 % (ref 11.5–15.5)
WBC: 3.9 10*3/uL — ABNORMAL LOW (ref 4.0–10.5)
nRBC: 0 % (ref 0.0–0.2)

## 2023-04-19 NOTE — Plan of Care (Signed)
   Problem: Education: Goal: Knowledge of General Education information will improve Description Including pain rating scale, medication(s)/side effects and non-pharmacologic comfort measures Outcome: Progressing   Problem: Health Behavior/Discharge Planning: Goal: Ability to manage health-related needs will improve Outcome: Progressing

## 2023-04-19 NOTE — Plan of Care (Signed)

## 2023-04-19 NOTE — Progress Notes (Signed)
 [facility]  PROGRESS NOTE  Stephanie Atkinson WGN:[id number] DOB: October 28, 1924 DOA: 04/08/2023 PCP: Linus Galas, NP   Brief HPI:    89 year old female history of chronic HF R EF, CKD 3B, CAD status post CABG in 2006, hypertension, hyperlipidemia, hypothyroidism, paroxysmal A-fib on Eliquis recently admitted from 04/06/2023-04/08/2023 for acute on chronic HFrEF presenting on day of discharge with complaints of generalized weakness 45 minutes after she arrived at home. Daughter describes episode which seems more presyncope nature. Patient subsequently brought to the ED. On presentation to the ED patient noted to have soft blood pressures, afebrile, no leukocytosis, urinalysis consistent with a UTI. Patient placed on antibiotics. Antihypertensive medications held and cardiac medications initially held. As patient improved clinically cardiac medications were resumed per cardiology recommendations/directions. Patient noted to go into acute on chronic CHF required IV Lasix and subsequently transition to oral torsemide. Patient awaiting SNF placement.      Assessment/Plan:   Generalized weakness  - Awaiting placement  - Ensure enlive 237 mL PO bid    UTI  - Completed antibiotics   Acute on chronic HFpEF  -Euvolemic - Losartan 12.5 mg PO daily    PAF  -Heart rate controlled - Eliquis 2.5 mg PO bid  - Toprol XL 12.5 mg PO daily    Hypothyroidism  - Synthroid 50 mcg PO daily    CKD 3b - Monitor  - Hold diuretics as BUN/Cr rising  -Follow BMP in a.m.    CAD  - Lipitor 80 mg PO at bedtime    Osteoarthritis - Tylenol PRN  - Voltaren gel 4 times daily  - Tramadol 25 mg PO q6 hr PRN    Medications     apixaban  2.5 mg Oral BID   atorvastatin  80 mg Oral QHS   diclofenac Sodium  2 g Topical QID   feeding supplement  237 mL Oral BID BM   fluticasone  2 spray Each Nare Daily   levothyroxine  50 mcg Oral QAC breakfast   loratadine  10 mg Oral Daily   losartan  12.5 mg Oral Daily    metoprolol succinate  12.5 mg Oral Daily     Data Reviewed:   CBG:  No results for input(s): "GLUCAP" in the last 168 hours.  SpO2: 97 %    Vitals:   04/19/23 0500 04/19/23 0723 04/19/23 1107 04/19/23 1518  BP:  (!) 139/51 (!) 126/52 (!) 132/49  Pulse:  61 63 (!) 59  Resp:  18 16 16   Temp:  98.1 F (36.7 C) 98 F (36.7 C) 98.4 F (36.9 C)  TempSrc:  Oral Oral Oral  SpO2:  98% 100% 97%  Weight: 56.6 kg     Height:          Data Reviewed:  Basic Metabolic Panel: Recent Labs  Lab 04/13/23 0657 04/14/23 0633 04/15/23 0542 04/16/23 0600 04/17/23 0555  NA 131* 128* 131* 129* 130*  K 4.0 4.6 4.2 4.0 4.2  CL 97* 96* 96* 95* 99  CO2 24 21* 23 24 21*  GLUCOSE 88 93 81 89 92  BUN 16 17 22  25* 20  CREATININE 1.07* 0.97 1.01* 1.15* 0.98  CALCIUM 10.1 10.4* 10.2 10.1 10.4*  MG 2.3  --   --  2.0  --     CBC: Recent Labs  Lab 04/15/23 0542 04/16/23 0600 04/17/23 0555 04/18/23 0620 04/19/23 0708  WBC 4.2 4.2 4.2 3.4* 3.9*  HGB 11.1* 11.1* 11.4* 10.9* 10.4*  HCT 33.0* 33.4* 34.1*  32.7* 32.0*  MCV 88.9 88.4 88.6 88.1 90.7  PLT 174 175 185 187 178    LFT Recent Labs  Lab 04/17/23 0555  AST 28  ALT 24  ALKPHOS 51  BILITOT 1.4*  PROT 5.9*  ALBUMIN 3.5     Antibiotics: Anti-infectives (From admission, onward)    Start     Dose/Rate Route Frequency Ordered Stop   04/09/23 0915  cefTRIAXone (ROCEPHIN) 2 g in sodium chloride 0.9 % 100 mL IVPB  Status:  Discontinued        2 g 200 mL/hr over 30 Minutes Intravenous Every 24 hours 04/09/23 0909 04/09/23 0933   04/08/23 2245  fosfomycin (MONUROL) packet 3 g        3 g Oral  Once 04/08/23 2232 04/09/23 0035        DVT prophylaxis: SCDs  Code Status: DNR  Family Communication: No family at bedside   CONSULTS    Subjective   No new complaints   Objective    Physical Examination:   General-appears in no acute distress Heart-S1-S2, regular, no murmur auscultated Lungs-clear to  auscultation bilaterally, no wheezing or crackles auscultated Abdomen-soft, nontender, no organomegaly Extremities-no edema in the lower extremities Neuro-alert, oriented x3, no focal deficit noted   Status is: Inpatient:             [name]   [facility] If 7PM-7AM, please contact night-coverage at [email or web address], Office  [phone]   04/19/2023, 4:47 PM  LOS: 0 days

## 2023-04-20 DIAGNOSIS — N179 Acute kidney failure, unspecified: Secondary | ICD-10-CM | POA: Diagnosis not present

## 2023-04-20 DIAGNOSIS — R531 Weakness: Secondary | ICD-10-CM | POA: Diagnosis not present

## 2023-04-20 DIAGNOSIS — N1832 Chronic kidney disease, stage 3b: Secondary | ICD-10-CM | POA: Diagnosis not present

## 2023-04-20 DIAGNOSIS — I5023 Acute on chronic systolic (congestive) heart failure: Secondary | ICD-10-CM | POA: Diagnosis not present

## 2023-04-20 DIAGNOSIS — I10 Essential (primary) hypertension: Secondary | ICD-10-CM | POA: Diagnosis not present

## 2023-04-20 DIAGNOSIS — R001 Bradycardia, unspecified: Secondary | ICD-10-CM

## 2023-04-20 DIAGNOSIS — R55 Syncope and collapse: Secondary | ICD-10-CM | POA: Diagnosis not present

## 2023-04-20 DIAGNOSIS — J810 Acute pulmonary edema: Secondary | ICD-10-CM | POA: Diagnosis not present

## 2023-04-20 DIAGNOSIS — N390 Urinary tract infection, site not specified: Secondary | ICD-10-CM | POA: Diagnosis not present

## 2023-04-20 LAB — COMPREHENSIVE METABOLIC PANEL
ALT: 26 U/L (ref 0–44)
AST: 26 U/L (ref 15–41)
Albumin: 3.2 g/dL — ABNORMAL LOW (ref 3.5–5.0)
Alkaline Phosphatase: 54 U/L (ref 38–126)
Anion gap: 5 (ref 5–15)
BUN: 21 mg/dL (ref 8–23)
CO2: 24 mmol/L (ref 22–32)
Calcium: 10.4 mg/dL — ABNORMAL HIGH (ref 8.9–10.3)
Chloride: 103 mmol/L (ref 98–111)
Creatinine, Ser: 1.14 mg/dL — ABNORMAL HIGH (ref 0.44–1.00)
GFR, Estimated: 44 mL/min — ABNORMAL LOW (ref >60)
Glucose, Bld: 81 mg/dL (ref 70–99)
Potassium: 4.5 mmol/L (ref 3.5–5.1)
Sodium: 132 mmol/L — ABNORMAL LOW (ref 135–145)
Total Bilirubin: 1.3 mg/dL — ABNORMAL HIGH (ref 0.0–1.2)
Total Protein: 5.7 g/dL — ABNORMAL LOW (ref 6.5–8.1)

## 2023-04-20 MED ORDER — TORSEMIDE 20 MG PO TABS
20.0000 mg | ORAL_TABLET | Freq: Every day | ORAL | Status: DC
  Administered 2023-04-20: 20 mg via ORAL
  Filled 2023-04-20: qty 1

## 2023-04-20 MED ORDER — MAGNESIUM HYDROXIDE 400 MG/5ML PO SUSP
30.0000 mL | Freq: Every day | ORAL | Status: DC | PRN
  Administered 2023-04-20: 30 mL via ORAL
  Filled 2023-04-20 (×3): qty 30

## 2023-04-20 NOTE — Progress Notes (Addendum)
 [facility]  PROGRESS NOTE  Stephanie Atkinson HYW:[id number] DOB: 1925-01-01 DOA: 04/08/2023 PCP: Stephanie Galas, NP   Brief HPI:    89 year old female history of chronic HF R EF, CKD 3B, CAD status post CABG in 2006, hypertension, hyperlipidemia, hypothyroidism, paroxysmal A-fib on Eliquis recently admitted from 04/06/2023-04/08/2023 for acute on chronic HFrEF presenting on day of discharge with complaints of generalized weakness 45 minutes after she arrived at home. Daughter describes episode which seems more presyncope nature. Patient subsequently brought to the ED. On presentation to the ED patient noted to have soft blood pressures, afebrile, no leukocytosis, urinalysis consistent with a UTI. Patient placed on antibiotics. Antihypertensive medications held and cardiac medications initially held. As patient improved clinically cardiac medications were resumed per cardiology recommendations/directions. Patient noted to go into acute on chronic CHF required IV Lasix and subsequently transition to oral torsemide. Patient awaiting SNF placement.      Assessment/Plan:   Generalized weakness/near syncope - Awaiting placement  -Continue TED hose bilaterally   UTI  - Completed antibiotics   Acute on chronic HFrEF  -Patient noted to have presented with a presyncopal episode. -Patient's Coreg, Entresto, Lasix initially held on admission.  -Patient seen in consultation by cardiology and patient started on Toprol-XL instead of Coreg today,  -Cardiology recommended holding Entresto at discharge and patient started on losartan 12.5 mg daily to complete her heart failure regimen.  -No SGLT2 inhibitor due to recurrent UTI.  -She did receive 1 dose of torsemide 20 mg daily as per cardiology recommendation however it was discontinued due to rising BUN/creatinine. -Will restart torsemide 20 mg daily today -Follow-up BMP in a.m.   PAF  -Heart rate controlled - Eliquis 2.5 mg PO bid  - Toprol XL 12.5 mg PO  daily    Hypothyroidism  - Synthroid 50 mcg PO daily    CKD 3b -Will start torsemide today \-Follow BMP in a.m.    CAD  - Lipitor 80 mg PO at bedtime    Osteoarthritis -Pain controlled - Tylenol PRN  - Voltaren gel 4 times daily  - Tramadol 25 mg PO q6 hr PRN  9.  Hyponatremia -Sodium improved to 132  Medications     apixaban  2.5 mg Oral BID   atorvastatin  80 mg Oral QHS   diclofenac Sodium  2 g Topical QID   feeding supplement  237 mL Oral BID BM   fluticasone  2 spray Each Nare Daily   levothyroxine  50 mcg Oral QAC breakfast   loratadine  10 mg Oral Daily   losartan  12.5 mg Oral Daily   metoprolol succinate  12.5 mg Oral Daily     Data Reviewed:   CBG:  No results for input(s): "GLUCAP" in the last 168 hours.  SpO2: 99 %    Vitals:   04/20/23 0454 04/20/23 0748 04/20/23 0916 04/20/23 1126  BP:  (!) 168/73  (!) 152/71  Pulse:  (!) 59 60 (!) 59  Resp:  18  18  Temp:  98.7 F (37.1 C)  97.8 F (36.6 C)  TempSrc:  Oral  Oral  SpO2:  98%  99%  Weight: 57.6 kg     Height:          Data Reviewed:  Basic Metabolic Panel: Recent Labs  Lab 04/14/23 0633 04/15/23 0542 04/16/23 0600 04/17/23 0555 04/20/23 0658  NA 128* 131* 129* 130* 132*  K 4.6 4.2 4.0 4.2 4.5  CL 96* 96* 95* 99 103  CO2  21* 23 24 21* 24  GLUCOSE 93 81 89 92 81  BUN 17 22 25* 20 21  CREATININE 0.97 1.01* 1.15* 0.98 1.14*  CALCIUM 10.4* 10.2 10.1 10.4* 10.4*  MG  --   --  2.0  --   --     CBC: Recent Labs  Lab 04/15/23 0542 04/16/23 0600 04/17/23 0555 04/18/23 0620 04/19/23 0708  WBC 4.2 4.2 4.2 3.4* 3.9*  HGB 11.1* 11.1* 11.4* 10.9* 10.4*  HCT 33.0* 33.4* 34.1* 32.7* 32.0*  MCV 88.9 88.4 88.6 88.1 90.7  PLT 174 175 185 187 178    LFT Recent Labs  Lab 04/17/23 0555 04/20/23 0658  AST 28 26  ALT 24 26  ALKPHOS 51 54  BILITOT 1.4* 1.3*  PROT 5.9* 5.7*  ALBUMIN 3.5 3.2*     Antibiotics: Anti-infectives (From admission, onward)    Start      Dose/Rate Route Frequency Ordered Stop   04/09/23 0915  cefTRIAXone (ROCEPHIN) 2 g in sodium chloride 0.9 % 100 mL IVPB  Status:  Discontinued        2 g 200 mL/hr over 30 Minutes Intravenous Every 24 hours 04/09/23 0909 04/09/23 0933   04/08/23 2245  fosfomycin (MONUROL) packet 3 g        3 g Oral  Once 04/08/23 2232 04/09/23 0035        DVT prophylaxis: SCDs  Code Status: DNR  Family Communication: No family at bedside   CONSULTS    Subjective   No new complaints.   Objective    Physical Examination:  General-appears in no acute distress Heart-S1-S2, regular, no murmur auscultated Lungs-clear to auscultation bilaterally, no wheezing or crackles auscultated Abdomen-soft, nontender, no organomegaly Extremities-no edema in the lower extremities Neuro-alert, oriented x3, no focal deficit noted   Status is: Inpatient:             [name]   [facility] If 7PM-7AM, please contact night-coverage at [email or web address], Office  [phone]   04/20/2023, 1:37 PM  LOS: 0 days

## 2023-04-20 NOTE — Progress Notes (Signed)
 Notified MD pt with no BM x5 days. Patient requesting milk of mag with breakfast; reported Miralax does not work for her. Orders received for milk of mag.

## 2023-04-20 NOTE — Plan of Care (Signed)

## 2023-04-21 ENCOUNTER — Observation Stay (HOSPITAL_COMMUNITY): Payer: Medicare PPO

## 2023-04-21 DIAGNOSIS — I700 Atherosclerosis of aorta: Secondary | ICD-10-CM | POA: Diagnosis not present

## 2023-04-21 DIAGNOSIS — I517 Cardiomegaly: Secondary | ICD-10-CM | POA: Diagnosis not present

## 2023-04-21 DIAGNOSIS — J811 Chronic pulmonary edema: Secondary | ICD-10-CM | POA: Diagnosis not present

## 2023-04-21 DIAGNOSIS — R531 Weakness: Secondary | ICD-10-CM | POA: Diagnosis not present

## 2023-04-21 LAB — BASIC METABOLIC PANEL
Anion gap: 8 (ref 5–15)
BUN: 29 mg/dL — ABNORMAL HIGH (ref 8–23)
CO2: 24 mmol/L (ref 22–32)
Calcium: 10.6 mg/dL — ABNORMAL HIGH (ref 8.9–10.3)
Chloride: 101 mmol/L (ref 98–111)
Creatinine, Ser: 1.35 mg/dL — ABNORMAL HIGH (ref 0.44–1.00)
GFR, Estimated: 36 mL/min — ABNORMAL LOW (ref >60)
Glucose, Bld: 89 mg/dL (ref 70–99)
Potassium: 4.7 mmol/L (ref 3.5–5.1)
Sodium: 133 mmol/L — ABNORMAL LOW (ref 135–145)

## 2023-04-21 MED ORDER — NIFEDIPINE ER OSMOTIC RELEASE 30 MG PO TB24
30.0000 mg | ORAL_TABLET | Freq: Every day | ORAL | Status: DC
  Administered 2023-04-21 – 2023-04-23 (×3): 30 mg via ORAL
  Filled 2023-04-21 (×3): qty 1

## 2023-04-21 NOTE — Progress Notes (Signed)
 [facility]  PROGRESS NOTE  Stephanie Atkinson:[id number] DOB: [birth year] DOA: 04/08/2023 PCP: Linus Galas, NP   Brief narrative:    Patient is a 89 year old female, past medical history significant for chronic HF R EF, CKD 3B, CAD status post CABG in 2006, hypertension, hyperlipidemia, hypothyroidism, paroxysmal A-fib on Eliquis.  Patient was recently admitted from 04/06/2023-04/08/2023 for acute on chronic HFrEF.  Patient represented on the day of discharge with complaints of generalized weakness (about 45 minutes after discharge). Daughter described episode which seemed more presyncope nature. Patient was subsequently brought to the ED. On presentation to the ED, patient was noted to have soft blood pressures, afebrile, and no leukocytosis. Antihypertensive medications were held.  As per prior documentation, patient was noted to have gone into acute on chronic CHF that required IV Lasix.  IV Lasix was subsequently transitioned to oral torsemide. Patient awaiting SNF placement.   04/21/2023: Hold torsemide and tramadol.  Check chest x-ray.  Continue to monitor closely.  Pursue disposition.     Assessment/Plan:   Generalized weakness/near syncope - Awaiting placement  -Continue TED hose bilaterally 04/21/2023: Hold torsemide and tramadol.   Suspected UTI: -Urine culture did not grow any organisms.     Acute on chronic HFrEF  -Patient noted to have presented with a presyncopal episode. -Patient's Coreg, Entresto, Lasix initially held on admission.  -Patient seen in consultation by cardiology and patient started on Toprol-XL instead of Coreg today,  -Cardiology recommended holding Entresto at discharge and patient started on losartan 12.5 mg daily to complete her heart failure regimen.  -No SGLT2 inhibitor due to recurrent UTI.  -She did receive 1 dose of torsemide 20 mg daily as per cardiology recommendation however it was discontinued due to rising BUN/creatinine. -Will restart torsemide  20 mg daily today -Follow-up BMP in a.m. 04/21/2023: Hold torsemide.  Repeat chest x-ray today.   PAF  -Heart rate controlled - Eliquis 2.5 mg PO bid  - Toprol XL 12.5 mg PO daily    Hypothyroidism  - Synthroid 50 mcg PO daily    CKD 3b -Will start torsemide today -Follow BMP in a.m.    CAD  - Lipitor 80 mg PO at bedtime    Osteoarthritis -Pain controlled - Tylenol PRN  - Voltaren gel 4 times daily  - Tramadol 25 mg PO q6 hr PRN  9.  Hyponatremia -Sodium improved to 133  Medications     apixaban  2.5 mg Oral BID   atorvastatin  80 mg Oral QHS   diclofenac Sodium  2 g Topical QID   feeding supplement  237 mL Oral BID BM   fluticasone  2 spray Each Nare Daily   levothyroxine  50 mcg Oral QAC breakfast   loratadine  10 mg Oral Daily   losartan  12.5 mg Oral Daily   metoprolol succinate  12.5 mg Oral Daily   NIFEdipine  30 mg Oral Daily     Data Reviewed:   CBG:  No results for input(s): "GLUCAP" in the last 168 hours.  SpO2: 100 %    Vitals:   04/20/23 2309 04/21/23 0346 04/21/23 0500 04/21/23 0745  BP: (!) 146/74 (!) 154/57  (!) 180/69  Pulse: 62 (!) 59  61  Resp: 16 16  18   Temp: 98.4 F (36.9 C) 97.6 F (36.4 C)  98 F (36.7 C)  TempSrc: Oral Oral  Oral  SpO2: 99% 100%  100%  Weight:   56.2 kg   Height:  Data Reviewed:  Basic Metabolic Panel: Recent Labs  Lab 04/15/23 0542 04/16/23 0600 04/17/23 0555 04/20/23 0658 04/21/23 0744  NA 131* 129* 130* 132* 133*  K 4.2 4.0 4.2 4.5 4.7  CL 96* 95* 99 103 101  CO2 23 24 21* 24 24  GLUCOSE 81 89 92 81 89  BUN 22 25* 20 21 29*  CREATININE 1.01* 1.15* 0.98 1.14* 1.35*  CALCIUM 10.2 10.1 10.4* 10.4* 10.6*  MG  --  2.0  --   --   --     CBC: Recent Labs  Lab 04/15/23 0542 04/16/23 0600 04/17/23 0555 04/18/23 0620 04/19/23 0708  WBC 4.2 4.2 4.2 3.4* 3.9*  HGB 11.1* 11.1* 11.4* 10.9* 10.4*  HCT 33.0* 33.4* 34.1* 32.7* 32.0*  MCV 88.9 88.4 88.6 88.1 90.7  PLT 174 175 185  187 178    LFT Recent Labs  Lab 04/17/23 0555 04/20/23 0658  AST 28 26  ALT 24 26  ALKPHOS 51 54  BILITOT 1.4* 1.3*  PROT 5.9* 5.7*  ALBUMIN 3.5 3.2*     Antibiotics: Anti-infectives (From admission, onward)    Start     Dose/Rate Route Frequency Ordered Stop   04/09/23 0915  cefTRIAXone (ROCEPHIN) 2 g in sodium chloride 0.9 % 100 mL IVPB  Status:  Discontinued        2 g 200 mL/hr over 30 Minutes Intravenous Every 24 hours 04/09/23 0909 04/09/23 0933   04/08/23 2245  fosfomycin (MONUROL) packet 3 g        3 g Oral  Once 04/08/23 2232 04/09/23 0035        DVT prophylaxis: SCDs  Code Status: DNR  Family Communication: No family at bedside   CONSULTS    Subjective   No new complaints.   Objective    Physical Examination: General condition: Not in any distress.  Awake and alert. HEENT: Patient is pale. Neck: Supple. CVS: S1-S2. Abdomen: Soft and nontender. Neuro: Awake and alert.  Very friendly.  Moves all extremities. Extremities: No leg edema.  Status is: Inpatient:     [name]   [facility] If 7PM-7AM, please contact night-coverage at [email or web address], Office  [phone]   04/21/2023, 9:00 AM  LOS: 0 days

## 2023-04-21 NOTE — TOC Progression Note (Signed)
 Transition of Care [facility]) - Progression Note    Patient Details  Name: Stephanie Atkinson MRN: [id number] Date of Birth: 06-Apr-1924  Transition of Care [facility]) CM/SW Contact  Baldemar Lenis, [place] Phone Number: 04/21/2023, 1:24 PM  Clinical Narrative:   CSW requested CMA to contact [facility] regarding patient's appeal of SNF denial. [employer] will need an appointment of representative form sent in. Form found, will complete and fax in to [facility] so they can continue to proceed with appeal request. CSW spoke with daughter, [name], to provide update, and she indicated understanding. CSW to follow.    Expected Discharge Plan: Skilled Nursing Facility Barriers to Discharge: Insurance Authorization  Expected Discharge Plan and Services   Discharge Planning Services: CM Consult   Living arrangements for the past 2 months: Apartment                                       Social Determinants of Health (SDOH) Interventions SDOH Screenings   Food Insecurity: No Food Insecurity (04/09/2023)  Housing: Low Risk  (04/09/2023)  Transportation Needs: No Transportation Needs (04/09/2023)  Utilities: Not At Risk (04/09/2023)  Financial Resource Strain: Low Risk  (03/04/2023)  Social Connections: Socially Isolated (04/09/2023)  Tobacco Use: High Risk (04/09/2023)    Readmission Risk Interventions    03/11/2023   10:35 AM  Readmission Risk Prevention Plan  Transportation Screening Complete  PCP or Specialist Appt within 5-7 Days Complete  Home Care Screening Complete  Medication Review (RN CM) Complete

## 2023-04-21 NOTE — Plan of Care (Signed)
  Problem: Activity: Goal: Risk for activity intolerance will decrease Outcome: Progressing   Problem: Nutrition: Goal: Adequate nutrition will be maintained Outcome: Progressing   Problem: Coping: Goal: Level of anxiety will decrease Outcome: Progressing   Problem: Elimination: Goal: Will not experience complications related to bowel motility Outcome: Progressing   Problem: Elimination: Goal: Will not experience complications related to urinary retention Outcome: Progressing   Problem: Pain Managment: Goal: General experience of comfort will improve and/or be controlled Outcome: Progressing   Problem: Safety: Goal: Ability to remain free from injury will improve Outcome: Progressing   Problem: Safety: Goal: Ability to remain free from injury will improve Outcome: Progressing   Problem: Skin Integrity: Goal: Risk for impaired skin integrity will decrease Outcome: Progressing

## 2023-04-21 NOTE — Plan of Care (Signed)
 Alert and oriented. Daughter came to bedside to visit. Still awaiting discharge disposition.   Problem: Education: Goal: Knowledge of General Education information will improve Description: Including pain rating scale, medication(s)/side effects and non-pharmacologic comfort measures Outcome: Progressing   Problem: Health Behavior/Discharge Planning: Goal: Ability to manage health-related needs will improve Outcome: Progressing   Problem: Clinical Measurements: Goal: Ability to maintain clinical measurements within normal limits will improve Outcome: Progressing Goal: Will remain free from infection Outcome: Progressing Goal: Diagnostic test results will improve Outcome: Progressing

## 2023-04-22 DIAGNOSIS — R531 Weakness: Secondary | ICD-10-CM | POA: Diagnosis not present

## 2023-04-22 MED ORDER — HYDROCODONE-ACETAMINOPHEN 5-325 MG PO TABS
1.0000 | ORAL_TABLET | Freq: Four times a day (QID) | ORAL | Status: DC | PRN
  Administered 2023-04-22 – 2023-04-24 (×7): 1 via ORAL
  Filled 2023-04-22 (×7): qty 1

## 2023-04-22 MED ORDER — OXYCODONE-ACETAMINOPHEN 5-325 MG PO TABS: 1.0000 | ORAL_TABLET | Freq: Three times a day (TID) | ORAL | Status: DC | PRN

## 2023-04-22 NOTE — Progress Notes (Signed)
 Occupational Therapy Treatment Patient Details Name: Stephanie Atkinson MRN: [id number] DOB: 15-Jan-1925 Today's Date: 04/22/2023   History of present illness Pt is a 89 y.o. female recently admitted from 04/06/2023 until 04/08/2023 for acute on chronic HFrEF, who presents on the day of discharge with complaints of generalized weakness 45 minutes after she arrived at her home. Possible overdiuresis, presumptive UTI, euvolemic on exam. PMH: HTN, HLD, CAD s/p CABG, paroxysmal atrial fibrillation, hypothyroidism, CKD stage 3B, MI, syncope, chronic HFrEF, R THA   OT comments  Pt met her acute OT goals; goals updated to reflect great progress. On arrival pt was eager to complete ADLs. Overall she mobilized within the room and bathroom with RW and mod I. Pt tolerated toileting, hygiene, grooming at the sink and LB bathing at the sink with mod I. No safety concern, LOB or SOB noted. She tolerated >15 minutes of OOB activity without a sitting rest break. OT to continue to follow acutely to facilitate progress towards established goals. Pt will continue to benefit from skilled inpatient follow up therapy, <3 hours/day per family request. Attempted to call pt's daughter, [name], to update her on the pt's progress however there was no answer.        If plan is discharge home, recommend the following:  Assistance with cooking/housework;Assist for transportation   Equipment Recommendations  None recommended by OT       Precautions / Restrictions Precautions Precautions: Fall (low) Restrictions Weight Bearing Restrictions Per Provider Order: No       Mobility Bed Mobility               General bed mobility comments: Pt OOB in recliner on arrival    Transfers Overall transfer level: Needs assistance Equipment used: Rolling walker (2 wheels) Transfers: Sit to/from Stand Sit to Stand: Modified independent (Device/Increase time)           General transfer comment: from recliner and toilet      Balance Overall balance assessment: Needs assistance Sitting-balance support: No upper extremity supported, Feet supported Sitting balance-Leahy Scale: Good     Standing balance support: No upper extremity supported Standing balance-Leahy Scale: Good Standing balance comment: ADLs at the sink                           ADL either performed or assessed with clinical judgement   ADL Overall ADL's : Needs assistance/impaired     Grooming: Modified independent;Standing       Lower Body Bathing: Modified independent;Sit to/from stand Lower Body Bathing Details (indicate cue type and reason): bathed LB while standing at the sink, no safety concerns, LOB or SOB noted.         Toilet Transfer: Modified Independent;Ambulation;Rolling walker (2 wheels);Regular Toilet   Toileting- Clothing Manipulation and Hygiene: Modified independent;Sitting/lateral lean       Functional mobility during ADLs: Modified independent;Rolling walker (2 wheels) General ADL Comments: Pt demonstrated great safety awareness and self-monitoring. She tolerated >15 minutes of standing functional tasks without a rest break, LOB, safety concern or increase in SOB. RW utilized with Electronics engineer.    Extremity/Trunk Assessment Upper Extremity Assessment Upper Extremity Assessment: Overall WFL for tasks assessed   Lower Extremity Assessment Lower Extremity Assessment: Defer to PT evaluation        Vision   Vision Assessment?: No apparent visual deficits   Perception Perception Perception: Within Functional Limits   Praxis Praxis Praxis: [facility]   Communication Communication Communication:  No apparent difficulties   Cognition Arousal: Alert Behavior During Therapy: WFL for tasks assessed/performed Cognition: No apparent impairments             OT - Cognition Comments: WFL, great awareness of RW management                 Following commands: Intact                  General Comments VSS, no SOB    Pertinent Vitals/ Pain       Pain Assessment Pain Assessment: Faces Faces Pain Scale: No hurt   Frequency  Min 1X/week        Progress Toward Goals  OT Goals(current goals can now be found in the care plan section)  Progress towards OT goals: Progressing toward goals  Acute Rehab OT Goals Patient Stated Goal: to go home OT Goal Formulation: With patient/family Time For Goal Achievement: 05/06/23 Potential to Achieve Goals: Good ADL Goals Additional ADL Goal #1: Pt will indep complete IADL medication management task Additional ADL Goal #2: Pt will complete all BADLs with mod I and LRAD   AM-PAC OT "6 Clicks" Daily Activity     Outcome Measure   Help from another person eating meals?: None Help from another person taking care of personal grooming?: None Help from another person toileting, which includes using toliet, bedpan, or urinal?: None Help from another person bathing (including washing, rinsing, drying)?: None Help from another person to put on and taking off regular upper body clothing?: None Help from another person to put on and taking off regular lower body clothing?: None 6 Click Score: 24    End of Session Equipment Utilized During Treatment: Rolling walker (2 wheels)  OT Visit Diagnosis: Unsteadiness on feet (R26.81);History of falling (Z91.81)   Activity Tolerance Patient tolerated treatment well   Patient Left in chair   Nurse Communication Mobility status        Time: 9562-1308 OT Time Calculation (min): 26 min  Charges: OT General Charges $OT Visit: 1 Visit OT Treatments $Self Care/Home Management : 23-37 mins  Derenda Mis, OTR/L Acute Rehabilitation Services Office [phone] Secure Chat Communication Preferred   Donia Pounds 04/22/2023, 9:55 AM

## 2023-04-22 NOTE — Progress Notes (Signed)
 PT Cancellation Note  Patient Details Name: Stephanie Atkinson MRN: [id number] DOB: 1924/07/23   Cancelled Treatment:    Reason Eval/Treat Not Completed: (P) Patient declined, no reason specified, pt declined stating she walked earlier and requesting this PTA to come back tomorrow.   Lenora Boys. PTA Acute Rehabilitation Services Office: [phone]    Catalina Antigua 04/22/2023, 3:30 PM

## 2023-04-22 NOTE — TOC Progression Note (Signed)
 Transition of Care [facility]) - Progression Note    Patient Details  Name: Stephanie Atkinson MRN: [id number] Date of Birth: [birth year]  Transition of Care [facility]) CM/SW Contact  Kermit Balo, RN Phone Number: 04/22/2023, 10:09 AM  Clinical Narrative:     CM called [employer] and they have received all needed information. CM has asked that the decision be escalated to critical. [employer] to update CM on decision 24-48 hours.   Expected Discharge Plan: Skilled Nursing Facility Barriers to Discharge: Insurance Authorization  Expected Discharge Plan and Services   Discharge Planning Services: CM Consult   Living arrangements for the past 2 months: Apartment                                       Social Determinants of Health (SDOH) Interventions SDOH Screenings   Food Insecurity: No Food Insecurity (04/09/2023)  Housing: Low Risk  (04/09/2023)  Transportation Needs: No Transportation Needs (04/09/2023)  Utilities: Not At Risk (04/09/2023)  Financial Resource Strain: Low Risk  (03/04/2023)  Social Connections: Socially Isolated (04/09/2023)  Tobacco Use: High Risk (04/09/2023)    Readmission Risk Interventions    03/11/2023   10:35 AM  Readmission Risk Prevention Plan  Transportation Screening Complete  PCP or Specialist Appt within 5-7 Days Complete  Home Care Screening Complete  Medication Review (RN CM) Complete

## 2023-04-22 NOTE — Progress Notes (Signed)
 Civil engineer, contracting Liaison Note   Ms. Shamikia Linskey is currently being followed by [facility]. [facility] will also be providing SNHH/PT/OT services upon discharge. Please reorder these services if appropriate at discharge.    Please do not hesitate to call with any questions or concerns.   Henderson Newcomer, LPN [facility] [phone]

## 2023-04-22 NOTE — Progress Notes (Signed)
 [facility]  PROGRESS NOTE  Stephanie Atkinson LKG:[id number] DOB: 1924-10-27 DOA: 04/08/2023 PCP: Linus Galas, NP   Brief narrative:    Patient is a 89 year old female, past medical history significant for chronic HF R EF, CKD 3B, CAD status post CABG in 2006, hypertension, hyperlipidemia, hypothyroidism, paroxysmal A-fib on Eliquis.  Patient was recently admitted from 04/06/2023-04/08/2023 for acute on chronic HFrEF.  Patient represented on the day of discharge with complaints of generalized weakness (about 45 minutes after discharge). Daughter described episode which seemed more presyncope nature. Patient was subsequently brought to the ED. On presentation to the ED, patient was noted to have soft blood pressures, afebrile, and no leukocytosis. Antihypertensive medications were held.  As per prior documentation, patient was noted to have gone into acute on chronic CHF that required IV Lasix.  IV Lasix was subsequently transitioned to oral torsemide. Patient awaiting SNF placement.   04/21/2023: Hold torsemide and tramadol.  Check chest x-ray.  Continue to monitor closely.  Pursue disposition. 04/22/2023: Patient seen alongside patient's daughter.  Patient is awaiting disposition.  Continue to hold tramadol and torsemide.  Continue to assess volume status closely.  Pursue SNF.     Assessment/Plan:   Generalized weakness/near syncope - Awaiting placement  -Continue TED hose bilaterally 04/22/2023: Continue GI hold torsemide and tramadol.  Start hydrocodone as needed for pain.   Suspected UTI: -Urine culture did not grow any organisms.     Acute on chronic HFrEF  -Patient noted to have presented with a presyncopal episode. -Patient's Coreg, Entresto, Lasix initially held on admission.  -Patient seen in consultation by cardiology and patient started on Toprol-XL instead of Coreg today,  -Cardiology recommended holding Entresto at discharge and patient started on losartan 12.5 mg daily to complete  her heart failure regimen.  -No SGLT2 inhibitor due to recurrent UTI.  -She did receive 1 dose of torsemide 20 mg daily as per cardiology recommendation however it was discontinued due to rising BUN/creatinine. -Will restart torsemide 20 mg daily today -Follow-up BMP in a.m. 04/22/2023: Continue to hold torsemide.     PAF  -Heart rate controlled - Eliquis 2.5 mg PO bid  - Toprol XL 12.5 mg PO daily    Hypothyroidism  - Synthroid 50 mcg PO daily    CKD 3b -Will start torsemide today -Follow BMP in a.m.    CAD  - Lipitor 80 mg PO at bedtime    Osteoarthritis -Pain controlled - Tylenol PRN  - Voltaren gel 4 times daily  - Tramadol 25 mg PO q6 hr PRN  9.  Hyponatremia -Sodium improved to 133  Medications     apixaban  2.5 mg Oral BID   atorvastatin  80 mg Oral QHS   diclofenac Sodium  2 g Topical QID   feeding supplement  237 mL Oral BID BM   fluticasone  2 spray Each Nare Daily   levothyroxine  50 mcg Oral QAC breakfast   loratadine  10 mg Oral Daily   losartan  12.5 mg Oral Daily   metoprolol succinate  12.5 mg Oral Daily   NIFEdipine  30 mg Oral Daily     Data Reviewed:   CBG:  No results for input(s): "GLUCAP" in the last 168 hours.  SpO2: 100 %    Vitals:   04/22/23 0342 04/22/23 0842 04/22/23 1200 04/22/23 1537  BP: (!) 128/54 138/66 (!) 147/48 (!) 142/53  Pulse: (!) 55 (!) 58 (!) 59 (!) 59  Resp: 16 18 20  16  Temp: 98.9 F (37.2 C) 98.2 F (36.8 C) 97.8 F (36.6 C) 98.6 F (37 C)  TempSrc:  Oral Oral Oral  SpO2: 98% 98% 98% 100%  Weight:      Height:          Data Reviewed:  Basic Metabolic Panel: Recent Labs  Lab 04/16/23 0600 04/17/23 0555 04/20/23 0658 04/21/23 0744  NA 129* 130* 132* 133*  K 4.0 4.2 4.5 4.7  CL 95* 99 103 101  CO2 24 21* 24 24  GLUCOSE 89 92 81 89  BUN 25* 20 21 29*  CREATININE 1.15* 0.98 1.14* 1.35*  CALCIUM 10.1 10.4* 10.4* 10.6*  MG 2.0  --   --   --     CBC: Recent Labs  Lab 04/16/23 0600  04/17/23 0555 04/18/23 0620 04/19/23 0708  WBC 4.2 4.2 3.4* 3.9*  HGB 11.1* 11.4* 10.9* 10.4*  HCT 33.4* 34.1* 32.7* 32.0*  MCV 88.4 88.6 88.1 90.7  PLT 175 185 187 178    LFT Recent Labs  Lab 04/17/23 0555 04/20/23 0658  AST 28 26  ALT 24 26  ALKPHOS 51 54  BILITOT 1.4* 1.3*  PROT 5.9* 5.7*  ALBUMIN 3.5 3.2*     Antibiotics: Anti-infectives (From admission, onward)    Start     Dose/Rate Route Frequency Ordered Stop   04/09/23 0915  cefTRIAXone (ROCEPHIN) 2 g in sodium chloride 0.9 % 100 mL IVPB  Status:  Discontinued        2 g 200 mL/hr over 30 Minutes Intravenous Every 24 hours 04/09/23 0909 04/09/23 0933   04/08/23 2245  fosfomycin (MONUROL) packet 3 g        3 g Oral  Once 04/08/23 2232 04/09/23 0035        DVT prophylaxis: SCDs  Code Status: DNR  Family Communication: No family at bedside   CONSULTS    Subjective   No new complaints.   Objective    Physical Examination: General condition: Not in any distress.  Awake and alert. HEENT: Patient is pale. Neck: Supple. CVS: S1-S2. Abdomen: Soft and nontender. Neuro: Awake and alert.  Very friendly.  Moves all extremities. Extremities: No leg edema.  Status is: Inpatient:     [name]   [facility] If 7PM-7AM, please contact night-coverage at [email or web address], Office  [phone]   04/22/2023, 6:47 PM  LOS: 0 days

## 2023-04-23 DIAGNOSIS — R531 Weakness: Secondary | ICD-10-CM | POA: Diagnosis not present

## 2023-04-23 LAB — RENAL FUNCTION PANEL
Albumin: 3.2 g/dL — ABNORMAL LOW (ref 3.5–5.0)
Anion gap: 7 (ref 5–15)
BUN: 21 mg/dL (ref 8–23)
CO2: 21 mmol/L — ABNORMAL LOW (ref 22–32)
Calcium: 10.5 mg/dL — ABNORMAL HIGH (ref 8.9–10.3)
Chloride: 106 mmol/L (ref 98–111)
Creatinine, Ser: 0.98 mg/dL (ref 0.44–1.00)
GFR, Estimated: 53 mL/min — ABNORMAL LOW (ref >60)
Glucose, Bld: 85 mg/dL (ref 70–99)
Phosphorus: 2.6 mg/dL (ref 2.5–4.6)
Potassium: 4.1 mmol/L (ref 3.5–5.1)
Sodium: 134 mmol/L — ABNORMAL LOW (ref 135–145)

## 2023-04-23 LAB — MAGNESIUM: Magnesium: 2.2 mg/dL (ref 1.7–2.4)

## 2023-04-23 MED ORDER — LOSARTAN POTASSIUM 25 MG PO TABS
25.0000 mg | ORAL_TABLET | Freq: Every day | ORAL | Status: DC
  Administered 2023-04-24: 25 mg via ORAL
  Filled 2023-04-23: qty 1

## 2023-04-23 NOTE — Progress Notes (Signed)
 Physical Therapy Treatment Patient Details Name: Stephanie Atkinson MRN: [id number] DOB: 1924/08/18 Today's Date: 04/23/2023   History of Present Illness Pt is a 89 y.o. female recently admitted from 04/06/2023 until 04/08/2023 for acute on chronic HFrEF, who presents on the day of discharge with complaints of generalized weakness 45 minutes after she arrived at her home. Possible overdiuresis, presumptive UTI, euvolemic on exam. PMH: HTN, HLD, CAD s/p CABG, paroxysmal atrial fibrillation, hypothyroidism, CKD stage 3B, MI, syncope, chronic HFrEF, R THA    PT Comments  Pt resting in bed on arrival and agreeable to session with great progress towards acute goals. Pt mobilizing throughout session at grossly mod I to supervision level for safety with rollator for support. Pt needing light assist for set up to donn robe in standing without UE support. Pt demonstrating increased activity tolerance this session, tolerating standing ADLs at sink and progressing gait distance significantly without need for rest break. No LOB throughout session and pt with improved safety awareness throughout session. Pt continues to benefit from skilled PT services to progress toward functional mobility goals.     If plan is discharge home, recommend the following: Assist for transportation;Assistance with cooking/housework;A lot of help with walking and/or transfers;Help with stairs or ramp for entrance   Can travel by private vehicle     Yes  Equipment Recommendations  None recommended by PT    Recommendations for Other Services       Precautions / Restrictions Precautions Precautions: Fall (low) Restrictions Weight Bearing Restrictions Per Provider Order: No     Mobility  Bed Mobility Overal bed mobility: Modified Independent             General bed mobility comments: HOB elevated    Transfers Overall transfer level: Needs assistance Equipment used: None, Rollator (4 wheels) Transfers: Sit to/from  Stand Sit to Stand: Modified independent (Device/Increase time)           General transfer comment: from EOB    Ambulation/Gait Ambulation/Gait assistance: Supervision Gait Distance (Feet): 400 Feet Assistive device: Rollator (4 wheels) Gait Pattern/deviations: Step-through pattern, Decreased stride length, Trunk flexed Gait velocity: decr     General Gait Details: supervision for safety   Stairs             Wheelchair Mobility     Tilt Bed    Modified Rankin (Stroke Patients Only)       Balance Overall balance assessment: Needs assistance Sitting-balance support: No upper extremity supported, Feet supported Sitting balance-Leahy Scale: Good     Standing balance support: No upper extremity supported Standing balance-Leahy Scale: Good Standing balance comment: ADLs at the sink, donning robe without UE support                            Communication Communication Communication: No apparent difficulties  Cognition Arousal: Alert Behavior During Therapy: WFL for tasks assessed/performed                             Following commands: Intact      Cueing Cueing Techniques: Verbal cues  Exercises      General Comments General comments (skin integrity, edema, etc.): VSS on RA      Pertinent Vitals/Pain Pain Assessment Pain Assessment: Faces Faces Pain Scale: Hurts a little bit Pain Location: LLE Pain Descriptors / Indicators: Aching, Sore Pain Intervention(s): Monitored during session, Limited activity within patient's tolerance  Home Living                          Prior Function            PT Goals (current goals can now be found in the care plan section) Acute Rehab PT Goals Patient Stated Goal: to not fall PT Goal Formulation: With patient/family Time For Goal Achievement: 04/24/23 Progress towards PT goals: Progressing toward goals    Frequency    Min 1X/week      PT Plan       Co-evaluation              AM-PAC PT "6 Clicks" Mobility   Outcome Measure  Help needed turning from your back to your side while in a flat bed without using bedrails?: None Help needed moving from lying on your back to sitting on the side of a flat bed without using bedrails?: None Help needed moving to and from a bed to a chair (including a wheelchair)?: None Help needed standing up from a chair using your arms (e.g., wheelchair or bedside chair)?: A Little Help needed to walk in hospital room?: A Little Help needed climbing 3-5 steps with a railing? : A Little 6 Click Score: 21    End of Session   Activity Tolerance: Patient tolerated treatment well Patient left: with call bell/phone within reach;in bed;with bed alarm set Nurse Communication: Mobility status PT Visit Diagnosis: Other abnormalities of gait and mobility (R26.89);History of falling (Z91.81)     Time: 1610-9604 PT Time Calculation (min) (ACUTE ONLY): 18 min  Charges:    $Gait Training: 8-22 mins PT General Charges $$ ACUTE PT VISIT: 1 Visit                     Deanna Wiater R. PTA Acute Rehabilitation Services Office: [phone]   Catalina Antigua 04/23/2023, 10:32 AM

## 2023-04-23 NOTE — TOC Progression Note (Addendum)
 Transition of Care [facility]) - Progression Note    Patient Details  Name: Stephanie Atkinson MRN: [id number] Date of Birth: 1924-05-27  [facility]) CM/SW Contact  Kermit Balo, RN Phone Number: 04/23/2023, 11:18 AM  Clinical Narrative:     CM has faxed [employer] updated information per their request: [phone]. CM has verified [facility] received the faxed information.  Awaiting determination on appeal. Daughter updated.  TOC following.  1352: Have received auth from [facility]: [id number]  dates: 2/13-04/28/2023 [facility] have a bed until next week. CM has gotten a bed at [facility] and daughter has accepted. She can d/c to [facility].   Expected Discharge Plan: Skilled Nursing Facility Barriers to Discharge: Insurance Authorization  Expected Discharge Plan and Services   Discharge Planning Services: CM Consult   Living arrangements for the past 2 months: Apartment                                       Social Determinants of Health (SDOH) Interventions SDOH Screenings   Food Insecurity: No Food Insecurity (04/09/2023)  Housing: Low Risk  (04/09/2023)  Transportation Needs: No Transportation Needs (04/09/2023)  Utilities: Not At Risk (04/09/2023)  Financial Resource Strain: Low Risk  (03/04/2023)  Social Connections: Socially Isolated (04/09/2023)  Tobacco Use: High Risk (04/09/2023)    Readmission Risk Interventions    03/11/2023   10:35 AM  Readmission Risk Prevention Plan  Transportation Screening Complete  PCP or Specialist Appt within 5-7 Days Complete  Home Care Screening Complete  Medication Review (RN CM) Complete

## 2023-04-23 NOTE — Progress Notes (Signed)
 PROGRESS NOTE    Stephanie Atkinson  ZOX:[id number] DOB: 24-Jun-1924 DOA: 04/08/2023 PCP: Linus Galas, NP     Brief Narrative:  This 89 year old female, with past medical history significant for chronic HFrEF, CKD 3B, CAD status post CABG in 2006, hypertension, hyperlipidemia, hypothyroidism, paroxysmal A-fib on Eliquis.  Patient was recently admitted from 04/06/2023-04/08/2023 for acute on chronic HFrEF.  Patient represented on the day of discharge with complaints of generalized weakness (about 45 minutes after discharge). Daughter described episode which seemed more presyncope nature. Patient was subsequently brought to the ED. On presentation to the ED, patient was noted to have soft blood pressures, afebrile, and no leukocytosis. Antihypertensive medications were held.  As per prior documentation, patient was noted to have gone into acute on chronic CHF that required IV Lasix.  IV Lasix was subsequently transitioned to oral torsemide. Patient awaiting SNF placement.    Patient is awaiting disposition.     Assessment & Plan:   Principal Problem:   Generalized weakness Active Problems:   Acute on chronic systolic CHF (congestive heart failure) (HCC)   Near syncope   Symptomatic bradycardia s/p PPM    CKD stage 3b, GFR 30-44 ml/min (HCC)   Acquired hypothyroidism   Essential hypertension   Dyslipidemia   CAD (coronary artery disease) of artery bypass graft   Dehydration   Paroxysmal atrial fibrillation (HCC)   HLD (hyperlipidemia)   Chronic diastolic heart failure (HCC)   UTI (urinary tract infection)   Acute systolic heart failure (HCC)   Hyponatremia   Acute pulmonary edema (HCC)   Generalized weakness / Near syncope: Patient is now medically clear, awaiting placement. Continue TED hose bilaterally Continue to  hold torsemide and tramadol.   Continue hydrocodone as needed for pain.   Suspected UTI: Urine culture did not grow any organisms.     Acute on chronic HFrEF:  Patient  presented with a presyncopal episode. Patient's Coreg, Entresto, Lasix initially held on admission.  Patient seen in consultation by cardiology and patient started on Toprol-XL instead of Coreg ,  Cardiology recommended holding Entresto at discharge and patient started on losartan 12.5 mg daily to complete her heart failure regimen.  No SGLT2 inhibitor due to recurrent UTI.  She did receive 1 dose of torsemide 20 mg daily as per cardiology recommendation however it was discontinued due to rising BUN/creatinine. Resume torsemide 20 mg daily.  Paroxysmal A-fib: Heart rate controlled. Continue Eliquis 2.5 mg PO bid . Continue Toprol XL 12.5 mg PO daily.    Hypothyroidism: Continue Synthroid 50 mcg PO daily .   CKD 3b: Serum creatinine at baseline, resume torsemide.     CAD: Continue Lipitor 80 mg PO at bedtime    Osteoarthritis Pain controlled Continue Tylenol PRN  Voltaren gel 4 times daily  Tramadol 25 mg PO q6 hr PRN   Hyponatremia: Sodium improved to 133   DVT prophylaxis: SCDs Code Status: DNR Family Communication: No family at bed side Disposition Plan:    Status is: Observation The patient remains OBS appropriate and will d/c before 2 midnights.  Admitted for generalized weakness PT and OT evaluation  Consultants:  None  Procedures:None  Antimicrobials:  Anti-infectives (From admission, onward)    Start     Dose/Rate Route Frequency Ordered Stop   04/09/23 0915  cefTRIAXone (ROCEPHIN) 2 g in sodium chloride 0.9 % 100 mL IVPB  Status:  Discontinued        2 g 200 mL/hr over 30 Minutes Intravenous Every 24 hours  04/09/23 0909 04/09/23 0933   04/08/23 2245  fosfomycin (MONUROL) packet 3 g        3 g Oral  Once 04/08/23 2232 04/09/23 0035      Subjective: Patient was seen and examined at bedside.  Overnight events noted.   She was sitting comfortably on the bed, watching television.  Objective: Vitals:   04/23/23 0022 04/23/23 0347 04/23/23 0904  04/23/23 1221  BP: 127/61 (!) 153/60 136/63 (!) 119/48  Pulse: 60 69 (!) 59 (!) 59  Resp: 17 17 20 20   Temp: 97.8 F (36.6 C) 97.8 F (36.6 C) 98.8 F (37.1 C) 97.9 F (36.6 C)  TempSrc:   Oral Oral  SpO2: 100% 99% 98% 99%  Weight:      Height:        Intake/Output Summary (Last 24 hours) at 04/23/2023 1341 Last data filed at 04/23/2023 0900 Gross per 24 hour  Intake 960 ml  Output --  Net 960 ml   Filed Weights   04/19/23 0500 04/20/23 0454 04/21/23 0500  Weight: 56.6 kg 57.6 kg 56.2 kg    Examination:  General exam: Appears calm and comfortable, deconditioned, not in any acute distress. Respiratory system: Clear to auscultation. Respiratory effort normal.  RR 16 Cardiovascular system: S1 & S2 heard, RRR. No JVD, murmurs, rubs, gallops or clicks.  Gastrointestinal system: Abdomen is non distended, soft and non tender.  Normal bowel sounds heard. Central nervous system: Alert and oriented x 3. No focal neurological deficits. Extremities: No edema, no cyanosis, no clubbing Skin: No rashes, lesions or ulcers Psychiatry: Judgement and insight appear normal. Mood & affect appropriate.     Data Reviewed: I have personally reviewed following labs and imaging studies  CBC: Recent Labs  Lab 04/17/23 0555 04/18/23 0620 04/19/23 0708  WBC 4.2 3.4* 3.9*  HGB 11.4* 10.9* 10.4*  HCT 34.1* 32.7* 32.0*  MCV 88.6 88.1 90.7  PLT 185 187 178   Basic Metabolic Panel: Recent Labs  Lab 04/17/23 0555 04/20/23 0658 04/21/23 0744 04/23/23 0616  NA 130* 132* 133* 134*  K 4.2 4.5 4.7 4.1  CL 99 103 101 106  CO2 21* 24 24 21*  GLUCOSE 92 81 89 85  BUN 20 21 29* 21  CREATININE 0.98 1.14* 1.35* 0.98  CALCIUM 10.4* 10.4* 10.6* 10.5*  MG  --   --   --  2.2  PHOS  --   --   --  2.6   GFR: Estimated Creatinine Clearance: 26.6 mL/min (by C-G formula based on SCr of 0.98 mg/dL). Liver Function Tests: Recent Labs  Lab 04/17/23 0555 04/20/23 0658 04/23/23 0616  AST 28 26  --    ALT 24 26  --   ALKPHOS 51 54  --   BILITOT 1.4* 1.3*  --   PROT 5.9* 5.7*  --   ALBUMIN 3.5 3.2* 3.2*   No results for input(s): "LIPASE", "AMYLASE" in the last 168 hours. No results for input(s): "AMMONIA" in the last 168 hours. Coagulation Profile: No results for input(s): "INR", "PROTIME" in the last 168 hours. Cardiac Enzymes: No results for input(s): "CKTOTAL", "CKMB", "CKMBINDEX", "TROPONINI" in the last 168 hours. BNP (last 3 results) No results for input(s): "PROBNP" in the last 8760 hours. HbA1C: No results for input(s): "HGBA1C" in the last 72 hours. CBG: No results for input(s): "GLUCAP" in the last 168 hours. Lipid Profile: No results for input(s): "CHOL", "HDL", "LDLCALC", "TRIG", "CHOLHDL", "LDLDIRECT" in the last 72 hours. Thyroid Function Tests:  No results for input(s): "TSH", "T4TOTAL", "FREET4", "T3FREE", "THYROIDAB" in the last 72 hours. Anemia Panel: No results for input(s): "VITAMINB12", "FOLATE", "FERRITIN", "TIBC", "IRON", "RETICCTPCT" in the last 72 hours. Sepsis Labs: No results for input(s): "PROCALCITON", "LATICACIDVEN" in the last 168 hours.  No results found for this or any previous visit (from the past 240 hours).   Radiology Studies: No results found.  Scheduled Meds:  apixaban  2.5 mg Oral BID   atorvastatin  80 mg Oral QHS   diclofenac Sodium  2 g Topical QID   feeding supplement  237 mL Oral BID BM   fluticasone  2 spray Each Nare Daily   levothyroxine  50 mcg Oral QAC breakfast   loratadine  10 mg Oral Daily   [START ON 04/24/2023] losartan  25 mg Oral Daily   metoprolol succinate  12.5 mg Oral Daily   Continuous Infusions:   LOS: 0 days    Time spent: 50 MINS    Willeen Niece, MD [facility]   If 7PM-7AM, please contact night-coverage

## 2023-04-23 NOTE — Plan of Care (Signed)

## 2023-04-24 ENCOUNTER — Other Ambulatory Visit (HOSPITAL_COMMUNITY): Payer: Self-pay

## 2023-04-24 DIAGNOSIS — D649 Anemia, unspecified: Secondary | ICD-10-CM | POA: Diagnosis not present

## 2023-04-24 DIAGNOSIS — I130 Hypertensive heart and chronic kidney disease with heart failure and stage 1 through stage 4 chronic kidney disease, or unspecified chronic kidney disease: Secondary | ICD-10-CM | POA: Diagnosis not present

## 2023-04-24 DIAGNOSIS — I959 Hypotension, unspecified: Secondary | ICD-10-CM | POA: Diagnosis not present

## 2023-04-24 DIAGNOSIS — I2510 Atherosclerotic heart disease of native coronary artery without angina pectoris: Secondary | ICD-10-CM | POA: Diagnosis not present

## 2023-04-24 DIAGNOSIS — I5022 Chronic systolic (congestive) heart failure: Secondary | ICD-10-CM | POA: Diagnosis not present

## 2023-04-24 DIAGNOSIS — Z7401 Bed confinement status: Secondary | ICD-10-CM | POA: Diagnosis not present

## 2023-04-24 DIAGNOSIS — E43 Unspecified severe protein-calorie malnutrition: Secondary | ICD-10-CM | POA: Insufficient documentation

## 2023-04-24 DIAGNOSIS — I5023 Acute on chronic systolic (congestive) heart failure: Secondary | ICD-10-CM | POA: Diagnosis not present

## 2023-04-24 DIAGNOSIS — I10 Essential (primary) hypertension: Secondary | ICD-10-CM | POA: Diagnosis not present

## 2023-04-24 DIAGNOSIS — R4182 Altered mental status, unspecified: Secondary | ICD-10-CM | POA: Diagnosis not present

## 2023-04-24 DIAGNOSIS — E785 Hyperlipidemia, unspecified: Secondary | ICD-10-CM | POA: Diagnosis not present

## 2023-04-24 DIAGNOSIS — E871 Hypo-osmolality and hyponatremia: Secondary | ICD-10-CM | POA: Diagnosis not present

## 2023-04-24 DIAGNOSIS — Z950 Presence of cardiac pacemaker: Secondary | ICD-10-CM | POA: Diagnosis not present

## 2023-04-24 DIAGNOSIS — N1832 Chronic kidney disease, stage 3b: Secondary | ICD-10-CM | POA: Diagnosis not present

## 2023-04-24 DIAGNOSIS — E039 Hypothyroidism, unspecified: Secondary | ICD-10-CM | POA: Diagnosis not present

## 2023-04-24 DIAGNOSIS — J810 Acute pulmonary edema: Secondary | ICD-10-CM | POA: Diagnosis not present

## 2023-04-24 DIAGNOSIS — R531 Weakness: Secondary | ICD-10-CM | POA: Diagnosis not present

## 2023-04-24 DIAGNOSIS — E034 Atrophy of thyroid (acquired): Secondary | ICD-10-CM | POA: Diagnosis not present

## 2023-04-24 DIAGNOSIS — R55 Syncope and collapse: Secondary | ICD-10-CM | POA: Diagnosis not present

## 2023-04-24 LAB — CBC
HCT: 33.8 % — ABNORMAL LOW (ref 36.0–46.0)
Hemoglobin: 10.9 g/dL — ABNORMAL LOW (ref 12.0–15.0)
MCH: 29.5 pg (ref 26.0–34.0)
MCHC: 32.2 g/dL (ref 30.0–36.0)
MCV: 91.4 fL (ref 80.0–100.0)
Platelets: 200 10*3/uL (ref 150–400)
RBC: 3.7 MIL/uL — ABNORMAL LOW (ref 3.87–5.11)
RDW: 14 % (ref 11.5–15.5)
WBC: 3.7 10*3/uL — ABNORMAL LOW (ref 4.0–10.5)
nRBC: 0 % (ref 0.0–0.2)

## 2023-04-24 MED ORDER — METOPROLOL SUCCINATE ER 25 MG PO TB24
12.5000 mg | ORAL_TABLET | Freq: Every day | ORAL | 1 refills | Status: AC
Start: 2023-04-25 — End: 2023-12-21

## 2023-04-24 MED ORDER — APIXABAN 2.5 MG PO TABS
2.5000 mg | ORAL_TABLET | Freq: Two times a day (BID) | ORAL | 1 refills | Status: AC
  Filled 2023-04-24: qty 180, 90d supply, fill #0

## 2023-04-24 MED ORDER — LOSARTAN POTASSIUM 25 MG PO TABS: 25.0000 mg | ORAL_TABLET | Freq: Every day | ORAL | 0 refills | Status: DC

## 2023-04-24 MED ORDER — TORSEMIDE 20 MG PO TABS: 20.0000 mg | ORAL_TABLET | Freq: Every day | ORAL | 0 refills | Status: AC

## 2023-04-24 MED ORDER — LORATADINE 10 MG PO TABS: 10.0000 mg | ORAL_TABLET | Freq: Every day | ORAL | 0 refills | Status: AC

## 2023-04-24 MED ORDER — FLUTICASONE PROPIONATE 50 MCG/ACT NA SUSP: 2.0000 | Freq: Every day | NASAL | 2 refills | Status: AC

## 2023-04-24 MED ORDER — HYDROCODONE-ACETAMINOPHEN 5-325 MG PO TABS: 1.0000 | ORAL_TABLET | Freq: Four times a day (QID) | ORAL | 0 refills | Status: AC | PRN

## 2023-04-24 NOTE — Discharge Summary (Addendum)
 Physician Discharge Summary  Stephanie Atkinson:[id number] DOB: 01/24/1925 DOA: 04/08/2023  PCP: Linus Galas, NP  Admit date: 04/08/2023  Discharge date: 04/24/2023  Admitted From: Home  Disposition:  [facility]  Recommendations for Outpatient Follow-up:  Follow up with PCP in 1-2 weeks. Please obtain BMP/CBC in one week. Advised to take losartan and metoprolol as per cardiology. Advised to take torsemide 20 mg daily.  Home Health:None Equipment/Devices:None  Discharge Condition: Stable CODE STATUS:DNR Diet recommendation: Heart Healthy   Brief [facility] Course: This 89 year old female, with past medical history significant for chronic HFrEF, CKD 3B, CAD status post CABG in 2006, hypertension, hyperlipidemia, hypothyroidism, paroxysmal A-fib on Eliquis.  Patient was recently admitted from 04/06/2023 - 04/08/2023 for acute on chronic HFrEF.  Patient represented in the ED on the day of discharge with complaints of generalized weakness (about 45 minutes after discharge). Daughter described episode which seemed more presyncope nature. Patient was subsequently brought to the ED. On presentation to the ED, patient was noted to have soft blood pressures, afebrile, and no leukocytosis. Antihypertensive medications were held.  As per prior documentation, patient was noted to have gone into acute on chronic CHF that required IV Lasix.  IV Lasix was subsequently transitioned to oral torsemide.  Patient is now medically clear and patient being discharged to skilled nursing facility for rehab.  Discharge Diagnoses:  Principal Problem:   Generalized weakness Active Problems:   Acute on chronic systolic CHF (congestive heart failure) (HCC)   Near syncope   Symptomatic bradycardia s/p PPM    CKD stage 3b, GFR 30-44 ml/min (HCC)   Acquired hypothyroidism   Essential hypertension   Dyslipidemia   CAD (coronary artery disease) of artery bypass graft   Dehydration   Paroxysmal atrial  fibrillation (HCC)   HLD (hyperlipidemia)   Chronic diastolic heart failure (HCC)   UTI (urinary tract infection)   Acute systolic heart failure (HCC)   Hyponatremia   Acute pulmonary edema (HCC)   Protein-calorie malnutrition, severe  Generalized weakness / Near syncope: Patient is now medically clear, awaiting placement. Continue TED hose bilaterally Continued to  hold torsemide and tramadol.   Continue hydrocodone as needed for pain.   Suspected UTI: Urine culture did not grow any organisms.     Acute on chronic HFrEF:  Patient presented with a presyncopal episode. Patient's Coreg, Entresto, Lasix initially held on admission.  Patient seen in consultation by cardiology and patient started on Toprol-XL instead of Coreg ,  Cardiology recommended holding Entresto at discharge and patient started on losartan 12.5 mg daily to complete her heart failure regimen.  No SGLT2 inhibitor due to recurrent UTI.  She did receive 1 dose of torsemide 20 mg daily as per cardiology recommendation however it was discontinued due to rising BUN/creatinine. Resume torsemide 20 mg daily.   Paroxysmal A-fib: Heart rate controlled. Continue Eliquis 2.5 mg PO bid . Continue Toprol XL 12.5 mg PO daily.    Hypothyroidism: Continue Synthroid 50 mcg PO daily .   CKD 3b: Serum creatinine at baseline, resume torsemide.   CAD: Continue Lipitor 80 mg PO at bedtime    Osteoarthritis Pain controlled Continue Tylenol PRN  Voltaren gel 4 times daily  Tramadol 25 mg PO q6 hr PRN   Hyponatremia: Sodium improved to 133.    Discharge Instructions  Discharge Instructions     Call MD for:  difficulty breathing, headache or visual disturbances   Complete by: As directed    Call MD for:  persistant dizziness or light-headedness   Complete by: As directed    Call MD for:  persistant nausea and vomiting   Complete by: As directed    Diet - low sodium heart healthy   Complete by: As directed    Diet  Carb Modified   Complete by: As directed    Discharge instructions   Complete by: As directed    Advised to follow-up with primary care physician in 1 week. Advised to take losartan and metoprolol as per cardiology.   Increase activity slowly   Complete by: As directed       Allergies as of 04/24/2023       Reactions   Penicillins Hives   Sulfa Antibiotics Hives, Swelling   Gabapentin Other (See Comments)   Confusion   Lyrica [pregabalin] Other (See Comments)   Confusion        Medication List     STOP taking these medications    carvedilol 3.125 MG tablet Commonly known as: COREG   Entresto 24-26 MG Generic drug: sacubitril-valsartan   furosemide 20 MG tablet Commonly known as: LASIX   spironolactone 25 MG tablet Commonly known as: ALDACTONE       TAKE these medications    acetaminophen 500 MG tablet Commonly known as: TYLENOL Take 500 mg by mouth See admin instructions. Take 500 mg in am, 1000 mg at 2 pm, 1000 mg 6 pm and 500 mg in pm by mouth.   apixaban 2.5 MG Tabs tablet Commonly known as: Eliquis Take 1 tablet (2.5 mg total) by mouth 2 (two) times daily.   fluticasone 50 MCG/ACT nasal spray Commonly known as: FLONASE Place 2 sprays into both nostrils daily. Start taking on: April 25, 2023   HYDROcodone-acetaminophen 5-325 MG tablet Commonly known as: NORCO/VICODIN Take 1 tablet by mouth every 6 (six) hours as needed for up to 3 days for moderate pain (pain score 4-6) or severe pain (pain score 7-10). What changed:  when to take this reasons to take this additional instructions   levothyroxine 50 MCG tablet Commonly known as: SYNTHROID Take 50 mcg by mouth daily before breakfast.   lidocaine 5 % Commonly known as: LIDODERM Place 1 patch onto the skin daily as needed (Pain).   Lipitor 80 MG tablet Generic drug: atorvastatin Take 80 mg by mouth at bedtime.   loratadine 10 MG tablet Commonly known as: CLARITIN Take 1 tablet (10 mg  total) by mouth daily. Start taking on: April 25, 2023   losartan 25 MG tablet Commonly known as: COZAAR Take 1 tablet (25 mg total) by mouth daily. Start taking on: April 25, 2023   metoprolol succinate 25 MG 24 hr tablet Commonly known as: TOPROL-XL Take 0.5 tablets (12.5 mg total) by mouth daily. Start taking on: April 25, 2023   SYSTANE OP Place 1 drop into both eyes daily as needed (dry eye).   torsemide 20 MG tablet Commonly known as: DEMADEX Take 1 tablet (20 mg total) by mouth daily.   VOLTAREN EX Apply 1 Dose topically daily as needed (Pain).               Durable Medical Equipment  (From admission, onward)           Start     Ordered   04/14/23 1205  For home use only DME 4 wheeled rolling walker with seat  Once       Question:  Patient needs a walker to treat with the following condition  Answer:  Debility   04/14/23 1204            Contact information for follow-up providers     Linus Galas, NP Follow up in 1 week(s).   Contact information: [address] [place] [place] [address] [phone]              Contact information for after-discharge care     Destination     [facility] .   Service: Skilled Nursing Contact information: [address] [facility] [address] Address [place] [address] [phone]                    Allergies  Allergen Reactions   Penicillins Hives   Sulfa Antibiotics Hives and Swelling   Gabapentin Other (See Comments)    Confusion   Lyrica [Pregabalin] Other (See Comments)    Confusion    Consultations: Cardiology   Procedures/Studies: DG CHEST PORT 1 VIEW Result Date: 04/21/2023 CLINICAL DATA:  Pulmonary edema. EXAM: PORTABLE CHEST 1 VIEW COMPARISON:  Chest radiograph dated 04/14/2023. FINDINGS: No focal consolidation, pleural effusion, pneumothorax. Mild cardiomegaly. Median sternotomy wires and CABG vascular clips. Atherosclerotic  calcification of the aorta. Left pectoral pacemaker device. No acute osseous pathology. IMPRESSION: 1. No active disease. 2. Mild cardiomegaly. Electronically Signed   By: Elgie Collard M.D.   On: 04/21/2023 13:23   DG CHEST PORT 1 VIEW Result Date: 04/14/2023 CLINICAL DATA:  Shortness of breath. EXAM: PORTABLE CHEST 1 VIEW COMPARISON:  04/05/2018 FINDINGS: Left-sided pacemaker in place. Stable cardiomegaly post CABG. Small pleural effusions are new. Increased interstitial thickening typical of pulmonary edema, new. No confluent airspace disease. No pneumothorax. IMPRESSION: Cardiomegaly with pulmonary edema and small pleural effusions. Electronically Signed   By: Narda Rutherford M.D.   On: 04/14/2023 15:06   CT Head Wo Contrast Result Date: 04/08/2023 CLINICAL DATA:  Headache, new onset (Age >= 51y) EXAM: CT HEAD WITHOUT CONTRAST TECHNIQUE: Contiguous axial images were obtained from the base of the skull through the vertex without intravenous contrast. RADIATION DOSE REDUCTION: This exam was performed according to the departmental dose-optimization program which includes automated exposure control, adjustment of the mA and/or kV according to patient size and/or use of iterative reconstruction technique. COMPARISON:  CT head 03/08/2023. FINDINGS: Motion limited study.  Within this limitation: Brain: No evidence of acute infarction, hemorrhage, hydrocephalus, extra-axial collection or mass lesion/mass effect. Remote right cerebellar infarct. Remote left thalamic lacunar infarct. Patchy white matter hypodensities are nonspecific but compatible with chronic microvascular ischemic disease. Vascular: No hyperdense vessel.  Calcific atherosclerosis. Skull: No acute fracture. Sinuses/Orbits: Clear sinuses.  No acute orbital findings. Other: No mastoid effusions. IMPRESSION: 1. No evidence of acute intracranial abnormality on motion limited assessment. Stable at CT. 2. Remote infarcts and chronic microvascular  ischemic disease. Electronically Signed   By: Feliberto Harts M.D.   On: 04/08/2023 21:06   CT ABDOMEN PELVIS WO CONTRAST Result Date: 04/08/2023 CLINICAL DATA:  Weakness, abdominal pain, discharge from hospital earlier today EXAM: CT ABDOMEN AND PELVIS WITHOUT CONTRAST TECHNIQUE: Multidetector CT imaging of the abdomen and pelvis was performed following the standard protocol without IV contrast. RADIATION DOSE REDUCTION: This exam was performed according to the departmental dose-optimization program which includes automated exposure control, adjustment of the mA and/or kV according to patient size and/or use of iterative reconstruction technique. COMPARISON:  03/08/2023 FINDINGS: Lower chest: The heart is enlarged without pericardial effusion. Stable pacemaker. No acute pleural or parenchymal lung disease. Hepatobiliary:  Cholecystectomy. Unremarkable unenhanced appearance of the liver. Pancreas: Unremarkable unenhanced appearance. Spleen: Unremarkable unenhanced appearance. Adrenals/Urinary Tract: No urinary tract calculi or obstructive uropathy within either kidney. Stable bilateral renal cysts do not require specific imaging follow-up. The adrenals and bladder are unremarkable. Stomach/Bowel: No bowel obstruction or ileus. Normal appendix right lower quadrant. Diverticulosis of the descending and sigmoid colon without evidence of acute diverticulitis. No bowel wall thickening or inflammatory change. Vascular/Lymphatic: Aortic atherosclerosis. No enlarged abdominal or pelvic lymph nodes. Reproductive: Uterus and bilateral adnexa are unremarkable. Other: No free fluid or free intraperitoneal gas. No abdominal wall hernia. Musculoskeletal: Right hip arthroplasty. Diffuse osteopenia. No acute or destructive bony abnormalities. Reconstructed images demonstrate no additional findings. IMPRESSION: 1. No acute intra-abdominal or intrapelvic process. 2. Distal colonic diverticulosis without diverticulitis. 3.  Cardiomegaly. 4.  Aortic Atherosclerosis (ICD10-I70.0). Electronically Signed   By: Sharlet Salina M.D.   On: 04/08/2023 21:06   DG Chest Portable 1 View Result Date: 04/06/2023 CLINICAL DATA:  Dyspnea, bilateral lower extremity swelling EXAM: PORTABLE CHEST 1 VIEW COMPARISON:  03/08/2023 FINDINGS: Single frontal view of the chest demonstrates postsurgical changes from CABG. Single lead pacer unchanged. Cardiac silhouette remains enlarged. No airspace disease, effusion, or pneumothorax. No acute bony abnormalities. IMPRESSION: 1. Stable enlarged cardiac silhouette. 2. No acute intrathoracic process. Electronically Signed   By: Sharlet Salina M.D.   On: 04/06/2023 18:04      Subjective: Patient was seen and examined at bedside.  Overnight events noted.   Patient seems much improved and wants to be discharged. Patient is being discharged to skilled nursing facility for rehab  Discharge Exam: Vitals:   04/24/23 0433 04/24/23 0840  BP: (!) 121/53 134/60  Pulse: 63 (!) 59  Resp: 18 16  Temp: 97.8 F (36.6 C) 98.1 F (36.7 C)  SpO2: 93% 100%   Vitals:   04/23/23 2027 04/24/23 0013 04/24/23 0433 04/24/23 0840  BP: 115/60 (!) 116/50 (!) 121/53 134/60  Pulse: (!) 59 (!) 59 63 (!) 59  Resp: 18 18 18 16   Temp: 98 F (36.7 C) 98 F (36.7 C) 97.8 F (36.6 C) 98.1 F (36.7 C)  TempSrc: Axillary   Oral  SpO2: 98% 98% 93% 100%  Weight:      Height:        General: Pt is alert, awake, not in acute distress Cardiovascular: RRR, S1/S2 +, no rubs, no gallops Respiratory: CTA bilaterally, no wheezing, no rhonchi Abdominal: Soft, NT, ND, bowel sounds + Extremities: no edema, no cyanosis    The results of significant diagnostics from this hospitalization (including imaging, microbiology, ancillary and laboratory) are listed below for reference.     Microbiology: No results found for this or any previous visit (from the past 240 hours).   Labs: BNP (last 3 results) Recent Labs     01/08/23 0527 01/16/23 1019 04/06/23 1744  BNP 273.7* 499.1* 339.2*   Basic Metabolic Panel: Recent Labs  Lab 04/20/23 0658 04/21/23 0744 04/23/23 0616  NA 132* 133* 134*  K 4.5 4.7 4.1  CL 103 101 106  CO2 24 24 21*  GLUCOSE 81 89 85  BUN 21 29* 21  CREATININE 1.14* 1.35* 0.98  CALCIUM 10.4* 10.6* 10.5*  MG  --   --  2.2  PHOS  --   --  2.6   Liver Function Tests: Recent Labs  Lab 04/20/23 0658 04/23/23 0616  AST 26  --   ALT 26  --   ALKPHOS 54  --  BILITOT 1.3*  --   PROT 5.7*  --   ALBUMIN 3.2* 3.2*   No results for input(s): "LIPASE", "AMYLASE" in the last 168 hours. No results for input(s): "AMMONIA" in the last 168 hours. CBC: Recent Labs  Lab 04/18/23 0620 04/19/23 0708 04/24/23 0651  WBC 3.4* 3.9* 3.7*  HGB 10.9* 10.4* 10.9*  HCT 32.7* 32.0* 33.8*  MCV 88.1 90.7 91.4  PLT 187 178 200   Cardiac Enzymes: No results for input(s): "CKTOTAL", "CKMB", "CKMBINDEX", "TROPONINI" in the last 168 hours. BNP: Invalid input(s): "POCBNP" CBG: No results for input(s): "GLUCAP" in the last 168 hours. D-Dimer No results for input(s): "DDIMER" in the last 72 hours. Hgb A1c No results for input(s): "HGBA1C" in the last 72 hours. Lipid Profile No results for input(s): "CHOL", "HDL", "LDLCALC", "TRIG", "CHOLHDL", "LDLDIRECT" in the last 72 hours. Thyroid function studies No results for input(s): "TSH", "T4TOTAL", "T3FREE", "THYROIDAB" in the last 72 hours.  Invalid input(s): "FREET3" Anemia work up No results for input(s): "VITAMINB12", "FOLATE", "FERRITIN", "TIBC", "IRON", "RETICCTPCT" in the last 72 hours. Urinalysis    Component Value Date/Time   COLORURINE YELLOW 04/09/2023 0727   APPEARANCEUR CLEAR 04/09/2023 0727   LABSPEC 1.008 04/09/2023 0727   PHURINE 6.0 04/09/2023 0727   GLUCOSEU NEGATIVE 04/09/2023 0727   HGBUR NEGATIVE 04/09/2023 0727   BILIRUBINUR NEGATIVE 04/09/2023 0727   KETONESUR NEGATIVE 04/09/2023 0727   PROTEINUR NEGATIVE  04/09/2023 0727   UROBILINOGEN 1.0 10/14/2012 0922   NITRITE NEGATIVE 04/09/2023 0727   LEUKOCYTESUR MODERATE (A) 04/09/2023 0727   Sepsis Labs Recent Labs  Lab 04/18/23 0620 04/19/23 0708 04/24/23 0651  WBC 3.4* 3.9* 3.7*   Microbiology No results found for this or any previous visit (from the past 240 hours).   Time coordinating discharge: Over 30 minutes  SIGNED:   Willeen Niece, MD  [facility] 04/24/2023, 11:50 AM Pager   If 7PM-7AM, please contact night-coverage

## 2023-04-24 NOTE — Progress Notes (Signed)
 Initial Nutrition Assessment  DOCUMENTATION CODES:   Severe malnutrition in context of chronic illness  INTERVENTION:  Continue Ensure Enlive po BID, each supplement provides 350 kcal and 20 grams of protein.  Magic cup TID with meals, each supplement provides 290 kcal and 9 grams of protein  Liberalize diet to regular diet to encourage adequate po intake  NUTRITION DIAGNOSIS:   Severe Malnutrition related to chronic illness as evidenced by severe muscle depletion, severe fat depletion.  GOAL:   Patient will meet greater than or equal to 90% of their needs  MONITOR:   PO intake, Weight trends, Supplement acceptance  REASON FOR ASSESSMENT:   Malnutrition Screening Tool   ASSESSMENT:   Pt with recent admission for acute on chronic systolic heart failure, d/c on 2/9 and readmitted same day for generalized weakness. Pt with PMH of HFrEF, CKD 3b, CAD s/p CABG 2006, HTN, HLD, hypothyroidism, paroxysmal A-fib.   Pt now medically clear; awaiting SNF placement.  Pt consuming average of 69% of meals on heart healthy diet in last 4 days. Pt's breakfast tray arrived during visit and pt requested a new tray since she did not order the food on this one. Per [facility] records, pt is on order with assist but received the tray automatically this AM. RD ordered new breakfast tray with items pt requested. Pt reports not liking the food here and thinks being able to order a wider variety of food off the menu will help her intake. RD will liberalize diet to regular. 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 Pt reports eating fine at home and doing the cooking herself. She reports her appetite has decreased but believes it to be from age. She reports eating "whatever she wants" and tries to eat healthy but indulges in anything she is craving. Pt reports eating lots lots of greens and beans. Pt consumes 2 vanilla Ensure's a day at home and reports loving the taste.    Pt reports not weighing herself at home but does think she has lost some weight recently given that her clothes are fitting looser. She reports being 145 lbs last time she checked. Per chart review pt was closest to reported weight on 10/2022 where she was 143 lbs. Pt now weighs 123 lbs.  Per chart review, pt's weight has been trending down since 10/2022 but difficult to determine what is weight loss versus fluid.  Pt reports living alone and getting around the house fine with a walker. She reports having a daughter that comes to visit her frequently.  Meds: Lipitor, Ensure Enlive, Imodium PRN, magnesium hydroxide PRN, Miralax PRN  Labs: Sodium low, Calcium high (10.5)   NUTRITION - FOCUSED PHYSICAL EXAM:  Flowsheet Row Most Recent Value  Orbital Region Mild depletion  Upper Arm Region Severe depletion  Thoracic and Lumbar Region Mild depletion  Buccal Region Severe depletion  Temple Region Moderate depletion  Clavicle Bone Region Moderate depletion  Clavicle and Acromion Bone Region Severe depletion  Scapular Bone Region Severe depletion  Dorsal Hand Unable to assess  [difficult to assess w/ swelling from arthirits]  Patellar Region Severe depletion  Anterior Thigh Region Severe depletion  Posterior Calf Region Severe depletion  Edema (RD Assessment) None  Hair Reviewed  Eyes Reviewed  Mouth Reviewed  Skin Reviewed  Nails Reviewed       Diet Order:   Diet Order             Diet regular Room service  appropriate? Yes with Assist; Fluid consistency: Thin  Diet effective now           Diet - low sodium heart healthy           Diet Carb Modified                   EDUCATION NEEDS:   No education needs have been identified at this time  Skin:  Skin Assessment: Reviewed RN Assessment  Last BM:  2/25  Height:   Ht Readings from Last 1 Encounters:  04/09/23 5\' 2"  (1.575 m)    Weight:   Wt Readings from Last 1 Encounters:  04/21/23 56.2 kg    BMI:   Body mass index is 22.66 kg/m.  Estimated Nutritional Needs:   Kcal:  1400-1600  Protein:  70-85 g  Fluid:  >1.5 L    Maceo Pro, MS Dietetic Intern

## 2023-04-24 NOTE — Progress Notes (Signed)
 Attempted to call report, no answer. Left voicemail with callback number

## 2023-04-24 NOTE — TOC Transition Note (Signed)
 Transition of Care [facility]) - Discharge Note   Patient Details  Name: Stephanie Atkinson MRN: [id number] Date of Birth: Jun 30, 1924  [facility] CM/SW Contact:  Kermit Balo, RN Phone Number: 04/24/2023, 12:07 PM   Clinical Narrative:     Pt is discharging to [facility]. She will transport via [facility]. [facility] has confirmed bed.   Room: 609B Number for report: [phone]   Final next level of care: Skilled Nursing Facility Barriers to Discharge: No Barriers Identified   Patient Goals and CMS Choice   CMS Medicare.gov Compare Post Acute Care list provided to:: Patient Represenative (must comment) Choice offered to / list presented to : Adult Children      Discharge Placement              Patient chooses bed at: [facility] Patient to be transferred to facility by: [facility] Name of family member notified: Joy--daughter Patient and family notified of of transfer: 04/24/23  Discharge Plan and Services Additional resources added to the After Visit Summary for     Discharge Planning Services: CM Consult                                 Social Drivers of Health (SDOH) Interventions SDOH Screenings   Food Insecurity: No Food Insecurity (04/09/2023)  Housing: Low Risk  (04/09/2023)  Transportation Needs: No Transportation Needs (04/09/2023)  Utilities: Not At Risk (04/09/2023)  Financial Resource Strain: Low Risk  (03/04/2023)  Social Connections: Socially Isolated (04/09/2023)  Tobacco Use: High Risk (04/09/2023)     Readmission Risk Interventions    03/11/2023   10:35 AM  Readmission Risk Prevention Plan  Transportation Screening Complete  PCP or Specialist Appt within 5-7 Days Complete  Home Care Screening Complete  Medication Review (RN CM) Complete

## 2023-04-25 NOTE — Progress Notes (Deleted)
 Cardiology Clinic Note   Patient Name: Stephanie Atkinson Date of Encounter: 04/25/2023  Primary Care Provider:  Linus Galas, NP Primary Cardiologist:  Nanetta Batty, MD  Patient Profile    Stephanie Atkinson 89 year old female presents to the clinic today for follow-up evaluation of her hypertension and coronary artery disease.  Past Medical History    Past Medical History:  Diagnosis Date   Chronic kidney disease, stage 3 (HCC)    Coronary artery disease    History of UTI    Hx of CABG 2006   LIMA-LAD, free RIMA-PDA, Radial-OM1-OM2   Hyperlipidemia    Hypertension    Hypothyroid    MI, old        PAF (paroxysmal atrial fibrillation) (HCC)    was on amio, d/c'd due to prolonged PR interval, rate control   Presence of permanent cardiac pacemaker 07/16/2017   Presence of stent in right coronary artery 08/2010   RIMA-PDA occluded, DES stent placed   Syncope 08/2010   bradycardic, beta blocker decreased, also felt to be dehydrated   Past Surgical History:  Procedure Laterality Date   CARDIAC CATHETERIZATION  12/10/2010   dr. Onalee Hua harding, revealing a atretic bypass to her right with patent distal RCA stent   CAROTID STENT  2012   CHOLECYSTECTOMY     CORONARY ARTERY BYPASS GRAFT  2006   LIMA-LAD, free RIMA-PDA, Radial-OM1-OM2   DOPPLER ECHOCARDIOGRAPHY  09/27/2010   mild asymmetric left ventricular hypertrophy, left ventricular systolic function is low normal, ejection fraction = 50-55%, the LA is moderate dilated, the RA is mildly dilated, no significant valvular disease   INSERT / REPLACE / REMOVE PACEMAKER  07/16/2017   LEFT HEART CATHETERIZATION WITH CORONARY/GRAFT ANGIOGRAM N/A 10/12/2012   Procedure: LEFT HEART CATHETERIZATION WITH Isabel Caprice;  Surgeon: Marykay Lex, MD;  Location: [facility] CATH LAB;  Service: Cardiovascular;  Laterality: N/A;   LOOP RECORDER IMPLANT N/A 10/15/2012   Procedure: LOOP RECORDER IMPLANT;  Surgeon: Thurmon Fair, MD;  Location: [facility]  CATH LAB;  Service: Cardiovascular;  Laterality: N/A;   LOOP RECORDER REMOVAL  07/16/2017   LOOP RECORDER REMOVAL N/A 07/16/2017   Procedure: LOOP RECORDER REMOVAL;  Surgeon: Thurmon Fair, MD;  Location: [facility] INVASIVE CV LAB;  Service: Cardiovascular;  Laterality: N/A;   NM MYOVIEW LTD  11/29/2008   post stress left ventricle is normal in size, post stress ejection fraction is 67% global left ventricular systolic function is normal, normal myocardial perfusion study, abnormal myocardial perfusion study, low risk scan   PACEMAKER IMPLANT N/A 07/16/2017   Procedure: PACEMAKER IMPLANT;  Surgeon: Thurmon Fair, MD;  Location: [facility] INVASIVE CV LAB;  Service: Cardiovascular;  Laterality: N/A;   THYROIDECTOMY     TOTAL HIP ARTHROPLASTY Right     Allergies  Allergies  Allergen Reactions   Penicillins Hives   Sulfa Antibiotics Hives and Swelling   Gabapentin Other (See Comments)    Confusion   Lyrica [Pregabalin] Other (See Comments)    Confusion    History of Present Illness     Stephanie Atkinson is a PMH of hypertension, AVB (beta-blocker and amiodarone stopped), permanent atrial fibrillation, coronary artery disease, atrial fibrillation with slow ventricular response, chronic diastolic CHF, de Quervain's tenosynovitis, and chronic kidney disease stage III.  She is status post CABG x4 3/06.  She underwent cardiac catheterization with PCI and DES to her PDA 7/12.  She underwent follow-up cardiac catheterization 10/12 and again 8/14.  She was admitted 10/13/2012 with syncope.  She received a loop recorder 10/14/2012.  She was noted to be mildly dehydrated and had started new nitrate therapy.  She had complaints of fatigue and was noted to have slow ventricular response.  She was not on any chronotropic medications.  Due to her increased dyspnea on exertion and slow heart rate she underwent permanent transvenous pacemaker implantation by Dr. Royann Shivers 07/16/2017.  Her symptoms clinically improved.   She  was admitted with community-acquired pneumonia 4/21 for 5 days and was also noted to have diastolic heart failure.  She was treated with diuresis and IV antibiotics.  She followed up with Dr. Allyson Sabal on 07/16/2019 and did not chest pain shortness of breath.   She was seen virtually 11/22/19 in follow-up and stated she felt well.  She stated that she did have some osteoarthritis in her right foot and knee.  She indicated that her right knee and foot did swell.  She monitored her blood pressure which she stated was up and down.  It was 113/80.  She continued to live independently, do all of her ADLs, laundry, and housework.  She stated that until COVID-19 she was walking several days per week.  She continued to walk but not as religiously due to the virus.  I planned follow-up with myself or Dr. Allyson Sabal in 6 months and provide her with the salty 6 diet sheet.   She was seen by Dr. Royann Shivers on 05/22/2020.  She was doing well at that time.  She reported that she had tripped on a curb and almost fallen.  She managed to catch her self.  She denied any bleeding issues.  She remained active.  Her pacemaker documented 1-1/2 hours of movement daily.  She denied lower extremity swelling, orthopnea, PND, exertional dyspnea and chest discomfort.  Her pacemaker interrogation showed normal device function.  At that time she was not pacemaker dependent.  Her underlying rhythm was atrial fibrillation with ventricular rate 45-50 bpm.   She presented to the clinic 06/27/2020 for follow up evaluation and stated  she felt well and continued to stay very physically active.  She reported that she even would exercise when she was sitting.  She reported that she had some ongoing right back pain.  She reported that she  had this for several years and had a back injury several years ago.  She reported some occasional tightness across her right lower back along with burning pain that shot down to her right buttocks.  She otherwise had no cardiac  complaints.  We reviewed her emergency department note.  I recommended that she do some light stretching and agility type exercises for her low back, Voltaren/diclofenac topical cream and I will have her follow-up in 6 months.  She was seen in follow-up by Dr. Allyson Sabal 09/12/2020.  During that time she continued to do well.  She denied chest pain or shortness of breath.  She reported wearing lower extremity compression stockings.  She was noted to have mild peripheral edema.  Her only major complaint was right knee pain.  She presented to the clinic 05/21/21 for follow-up evaluation stated she felt well.  She did have some concerns about her medication.  Nebivolol and valsartan were still on her medication list, as well as spironolactone.  We reviewed her blood pressures at home.  She reported that her blood pressures at home were usually in the 130-140 range.  In the office it was 140/64.  We reviewed her lab work . She remained very physically active  walking and staying busy.  I  resumed her valsartan at 80 mg and order a BMP in 1 week.   Follow-up in 6 months was planned.   She continue to follow-up regularly with cardiology.  She was admitted to the hospital 04/06/2023 and discharged on 04/24/2023.  She reported shortness of breath.  She was diagnosed with acute on chronic systolic CHF.  She received diuresis.  Cardiology was consulted to reevaluate due to worsening shortness of breath and concerns for reaccumulation of fluid.  Echocardiogram showed an EF of 35-40%.  Her volume status and lung exam showed much improved function she was noted to have orthostatic hypotension.  Lower extremity compression stockings were ordered.  Her metoprolol 12.5, spironolactone 12.5 were continued and her losartan 12.5 mg daily was added to her medication regimen.  It was felt that holding her Entresto until discharge would help with her symptoms.  She was not felt to be a good candidate for SGLT2 inhibitor due to recurrent  UTIs.  She presents to the clinic today for follow-up evaluation and states***.   Today she denies chest pain, shortness of breath, lower extremity edema, fatigue, palpitations, melena, hematuria, hemoptysis, diaphoresis, weakness, presyncope, syncope, orthopnea, and PND.  Home Medications    Prior to Admission medications   Medication Sig Start Date End Date Taking? Authorizing Provider  acetaminophen (TYLENOL) 500 MG tablet Take 1 tablet (500 mg total) by mouth every 6 (six) hours as needed for moderate pain. 06/27/19   Briant Cedar, MD  amLODipine (NORVASC) 10 MG tablet TAKE 1 TABLET BY MOUTH EVERY DAY 03/27/21   Runell Gess, MD  atorvastatin (LIPITOR) 80 MG tablet Take 80 mg by mouth at bedtime.     [provider]  benzonatate (TESSALON) 100 MG capsule Take 1 capsule by mouth every 8 (eight) hours for cough. 11/06/20   Mardella Layman, MD  cephALEXin (KEFLEX) 500 MG capsule Take 1 capsule (500 mg total) by mouth 3 (three) times daily. 12/01/20   Cathren Laine, MD  cholecalciferol (VITAMIN D3) 25 MCG (1000 UT) tablet Take 1,000 Units by mouth daily.    [provider]  cyclobenzaprine (FLEXERIL) 10 MG tablet 1/2 - 1 tablet 05/30/16   [provider]  diphenhydrAMINE (BENADRYL) 25 MG tablet Take 25 mg by mouth every 6 (six) hours as needed for allergies.    [provider]  ELIQUIS 5 MG TABS tablet TAKE 1 TABLET BY MOUTH TWICE A DAY 01/08/21   Runell Gess, MD  furosemide (LASIX) 20 MG tablet TAKE 1 TABLET BY MOUTH EVERY DAY 02/12/21   Runell Gess, MD  hydroxypropyl methylcellulose / hypromellose (ISOPTO TEARS / GONIOVISC) 2.5 % ophthalmic solution Place 1 drop into both eyes 3 (three) times daily as needed for dry eyes.    [provider]  hydrOXYzine (ATARAX/VISTARIL) 25 MG tablet Take 25 mg by mouth 2 (two) times daily as needed.    [provider]  levothyroxine (SYNTHROID, LEVOTHROID) 50 MCG tablet Take 50 mcg by  mouth daily before breakfast.     [provider]  nebivolol (BYSTOLIC) 5 MG tablet Take 5 mg by mouth daily.    [provider]  nitrofurantoin, macrocrystal-monohydrate, (MACROBID) 100 MG capsule 1 capsule 11/03/20   [provider]  nitroGLYCERIN (NITROSTAT) 0.4 MG SL tablet Place 1 tablet (0.4 mg total) under the tongue every 5 (five) minutes as needed for chest pain. 05/07/21   Runell Gess, MD  spironolactone (ALDACTONE) 25 MG  tablet Take 1 tablet (25 mg total) by mouth daily. 01/29/21   Runell Gess, MD  traMADol (ULTRAM) 50 MG tablet Take 50 mg by mouth every 6 (six) hours as needed (for pain).     [provider]  traZODone (DESYREL) 100 MG tablet TAKE 1 TABLET BY MOUTH EVERY DAY AT BEDTIME FOR 30 DAYS    [provider]  valsartan (DIOVAN) 160 MG tablet Take 160 mg by mouth daily. 03/06/18   [provider]    Family History    Family History  Problem Relation Age of Onset   CAD Mother    CAD Maternal Grandmother    She indicated that her mother is deceased. She indicated that her father is deceased. She indicated that the status of her maternal grandmother is unknown.  Social History    Social History   Socioeconomic History   Marital status: Single    Spouse name: Not on file   Number of children: Not on file   Years of education: Not on file   Highest education level: Not on file  Occupational History   Not on file  Tobacco Use   Smoking status: Never   Smokeless tobacco: Current    Types: Snuff   Tobacco comments:    03/08/2023 patient still use Snuff  Vaping Use   Vaping status: Never Used  Substance and Sexual Activity   Alcohol use: No   Drug use: No   Sexual activity: Not on file  Other Topics Concern   Not on file  Social History Narrative   Not on file   Social Drivers of Health   Financial Resource Strain: Low Risk  (03/04/2023)   Overall Financial Resource Strain (CARDIA)    Difficulty  of Paying Living Expenses: Not very hard  Food Insecurity: No Food Insecurity (04/09/2023)   Hunger Vital Sign    Worried About Running Out of Food in the Last Year: Never true    Ran Out of Food in the Last Year: Never true  Transportation Needs: No Transportation Needs (04/09/2023)   PRAPARE - Administrator, Civil Service (Medical): No    Lack of Transportation (Non-Medical): No  Physical Activity: Not on file  Stress: Not on file  Social Connections: Socially Isolated (04/09/2023)   Social Connection and Isolation Panel [NHANES]    Frequency of Communication with Friends and Family: Twice a week    Frequency of Social Gatherings with Friends and Family: Once a week    Attends Religious Services: Never    Database administrator or Organizations: No    Attends Banker Meetings: Never    Marital Status: Widowed  Intimate Partner Violence: Not At Risk (04/09/2023)   Humiliation, Afraid, Rape, and Kick questionnaire    Fear of Current or Ex-Partner: No    Emotionally Abused: No    Physically Abused: No    Sexually Abused: No     Review of Systems    General:  No chills, fever, night sweats or weight changes.  Cardiovascular:  No chest pain, dyspnea on exertion, edema, orthopnea, palpitations, paroxysmal nocturnal dyspnea. Dermatological: No rash, lesions/masses Respiratory: No cough, dyspnea Urologic: No hematuria, dysuria Abdominal:   No nausea, vomiting, diarrhea, bright red blood per rectum, melena, or hematemesis Neurologic:  No visual changes, wkns, changes in mental status. All other systems reviewed and are otherwise negative except as noted above.  Physical Exam    VS:  There were no vitals  taken for this visit. , BMI There is no height or weight on file to calculate BMI. GEN: Well nourished, well developed, in no acute distress. HEENT: normal. Neck: Supple, no JVD, carotid bruits, or masses. Cardiac: RRR, no murmurs, rubs, or gallops. No  clubbing, cyanosis, edema.  Radials/DP/PT 2+ and equal bilaterally.  Respiratory:  Respirations regular and unlabored, clear to auscultation bilaterally. GI: Soft, nontender, nondistended, BS + x 4. MS: no deformity or atrophy. Skin: warm and dry, no rash. Neuro:  Strength and sensation are intact. Psych: Normal affect.  Accessory Clinical Findings    Recent Labs: 04/06/2023: B Natriuretic Peptide 339.2 04/07/2023: TSH 1.211 04/20/2023: ALT 26 04/23/2023: BUN 21; Creatinine, Ser 0.98; Magnesium 2.2; Potassium 4.1; Sodium 134 04/24/2023: Hemoglobin 10.9; Platelets 200   Recent Lipid Panel    Component Value Date/Time   CHOL 127 11/25/2016 1005   TRIG 117 11/25/2016 1005   HDL 46 11/25/2016 1005   CHOLHDL 2.8 11/25/2016 1005   CHOLHDL 2.6 01/05/2015 1118   VLDL 18 01/05/2015 1118   LDLCALC 58 11/25/2016 1005    ECG personally reviewed by me today-none today.  Echocardiogram 06/26/2019 IMPRESSIONS     1. Septal dyssynergy due to ventricular paciing; biatrial enlargement;  mild MR and TR.   2. Left ventricular ejection fraction, by estimation, is 50 to 55%. The  left ventricle has low normal function. The left ventricle has no regional  wall motion abnormalities. Left ventricular diastolic parameters are  indeterminate. Elevated left atrial  pressure.   3. Right ventricular systolic function is normal. The right ventricular  size is normal. There is mildly elevated pulmonary artery systolic  pressure.   4. Left atrial size was severely dilated.   5. Right atrial size was moderately dilated.   6. The mitral valve is normal in structure. Mild mitral valve  regurgitation. No evidence of mitral stenosis.   7. The aortic valve is tricuspid. Aortic valve regurgitation is not  visualized. Mild aortic valve sclerosis is present, with no evidence of  aortic valve stenosis.   8. The inferior vena cava is normal in size with greater than 50%  respiratory variability, suggesting right  atrial pressure of 3 mmHg.  Echocardiogram 01/09/2023  IMPRESSIONS     1. Abnormal (paradoxical) septal motion, consistent with RV pacemaker.      . Left ventricular ejection fraction, by estimation, is 35 to 40%. The  left ventricle has moderately decreased function. The left ventricle  demonstrates global hypokinesis. There is mild left ventricular  hypertrophy. Left ventricular diastolic  parameters are indeterminate.   2. Right ventricular systolic function is normal. The right ventricular  size is normal. There is normal pulmonary artery systolic pressure.   3. Left atrial size was severely dilated.   4. Right atrial size was severely dilated.   5. The mitral valve is degenerative. Trivial mitral valve regurgitation.  No evidence of mitral stenosis.   6. The aortic valve is tricuspid. Aortic valve regurgitation is trivial.  No aortic stenosis is present.   7. Pulmonic valve regurgitation is moderate.   FINDINGS   Left Ventricle: Abnormal (paradoxical) septal motion, consistent with RV  pacemaker.  Left ventricular ejection fraction, by estimation, is 35 to 40%. The left  ventricle has moderately decreased function. The left ventricle  demonstrates global hypokinesis. The left ventricular internal cavity size  was normal in size. There is mild left  ventricular hypertrophy. Left ventricular diastolic function could not be  evaluated due to atrial fibrillation. Left  ventricular diastolic  parameters are indeterminate. Indeterminate filling pressures.   Right Ventricle: The right ventricular size is normal. No increase in  right ventricular wall thickness. Right ventricular systolic function is  normal. There is normal pulmonary artery systolic pressure. The tricuspid  regurgitant velocity is 2.29 m/s, and   with an assumed right atrial pressure of 3 mmHg, the estimated right  ventricular systolic pressure is 24.0 mmHg.   Left Atrium: Left atrial size was severely dilated.    Right Atrium: Right atrial size was severely dilated.   Pericardium: Trivial pericardial effusion is present.   Mitral Valve: The mitral valve is degenerative in appearance. Trivial  mitral valve regurgitation. No evidence of mitral valve stenosis.   Tricuspid Valve: The tricuspid valve is normal in structure. Tricuspid  valve regurgitation is mild . No evidence of tricuspid stenosis.   Aortic Valve: The aortic valve is tricuspid. Aortic valve regurgitation is  trivial. No aortic stenosis is present.   Pulmonic Valve: The pulmonic valve was normal in structure. Pulmonic valve  regurgitation is moderate. No evidence of pulmonic stenosis.   Aorta: The aortic root and ascending aorta are structurally normal, with  no evidence of dilitation.   IAS/Shunts: No atrial level shunt detected by color flow Doppler.   Assessment & Plan   1.  Acute on chronic systolic CHF, ischemic cardiomyopathy-euvolemic.  Weight today***.  Tolerating torsemide well.  Slowly increasing physical activity posthospitalization. Daily weights-contact office with weight increase of 2 to 3 pounds overnight or 5 pounds in 1 week. Elevate lower extremities when not active Lower extremity support stockings Continue torsemide, metoprolol, spironolactone, losartan.  Not a candidate for SGLT2 inhibitor due to history of recurrent UTI. Fluid restriction-48 ounces or less daily  Orthostatic hypotension-BP today 140***/64.  Similar control at home.   Continue metoprolol, spironolactone, losartan Continue to hold Entresto at this time. Heart healthy diet  Coronary artery disease-denies anginal symptoms.    Status post CABG x4 3/06.  Underwent repeat cardiac catheterization 8/14 which showed unchanged coronary anatomy. Continue atorvastatin, metoprolol Heart healthy low-sodium  Increase physical activity as tolerated   Atrial fibrillation, complete heart block-heart rate today 7***4.  Denies lightheadedness,  presyncope or syncope.  Denies palpitations.  Underwent pacemaker implantation by Dr. Royann Shivers 5/19. Continue Eliquis Continue to slowly increase physical activity   Hyperlipidemia-LDL 5***1 on 01/12/20 Continue atorvastatin Heart healthy low-sodium diet-salty 6 given Increase physical activity as tolerated Followed by PCP-request labs.     Disposition: Follow-up with Dr. Allyson Sabal or me in 3-4 months.   Thomasene Ripple. Tirza Senteno NP-C    04/25/2023, 7:32 AM [facility] [address] Office [phone] Fax [phone]  Notice: This dictation was prepared with Dragon dictation along with smaller phrase technology. Any transcriptional errors that result from this process are unintentional and may not be corrected upon review.  I spent 14*** minutes examining this patient, reviewing medications, and using patient centered shared decision making involving her cardiac care.   I spent greater than 20 minutes reviewing her past medical history,  medications, and prior cardiac tests.

## 2023-04-28 ENCOUNTER — Ambulatory Visit: Payer: Medicare PPO | Attending: General Practice | Admitting: General Practice

## 2023-04-28 DIAGNOSIS — E039 Hypothyroidism, unspecified: Secondary | ICD-10-CM | POA: Diagnosis not present

## 2023-04-28 DIAGNOSIS — D649 Anemia, unspecified: Secondary | ICD-10-CM | POA: Diagnosis not present

## 2023-04-28 DIAGNOSIS — I10 Essential (primary) hypertension: Secondary | ICD-10-CM | POA: Diagnosis not present

## 2023-04-28 DIAGNOSIS — E785 Hyperlipidemia, unspecified: Secondary | ICD-10-CM | POA: Diagnosis not present

## 2023-05-08 ENCOUNTER — Ambulatory Visit

## 2023-05-08 DIAGNOSIS — I5022 Chronic systolic (congestive) heart failure: Secondary | ICD-10-CM

## 2023-05-09 ENCOUNTER — Encounter: Payer: Self-pay | Admitting: Cardiovascular Disease

## 2023-05-09 DIAGNOSIS — G894 Chronic pain syndrome: Secondary | ICD-10-CM | POA: Diagnosis not present

## 2023-05-09 DIAGNOSIS — I5022 Chronic systolic (congestive) heart failure: Secondary | ICD-10-CM | POA: Diagnosis not present

## 2023-05-09 DIAGNOSIS — Z950 Presence of cardiac pacemaker: Secondary | ICD-10-CM | POA: Diagnosis not present

## 2023-05-09 DIAGNOSIS — M5432 Sciatica, left side: Secondary | ICD-10-CM | POA: Diagnosis not present

## 2023-05-09 LAB — CUP PACEART REMOTE DEVICE CHECK
Battery Remaining Longevity: 67 mo
Battery Voltage: 2.97 V
Brady Statistic RV Percent Paced: 98.71 %
Date Time Interrogation Session: 20250313132259
Implantable Lead Connection Status: 753985
Implantable Lead Implant Date: 20190522
Implantable Lead Location: 753860
Implantable Lead Model: 5076
Implantable Pulse Generator Implant Date: 20190522
Lead Channel Impedance Value: 323 Ohm
Lead Channel Impedance Value: 437 Ohm
Lead Channel Pacing Threshold Amplitude: 0.625 V
Lead Channel Pacing Threshold Pulse Width: 0.4 ms
Lead Channel Sensing Intrinsic Amplitude: 8.25 mV
Lead Channel Sensing Intrinsic Amplitude: 8.25 mV
Lead Channel Setting Pacing Amplitude: 2.5 V
Lead Channel Setting Pacing Pulse Width: 0.4 ms
Lead Channel Setting Sensing Sensitivity: 1.2 mV
Zone Setting Status: 755011

## 2023-05-29 ENCOUNTER — Other Ambulatory Visit: Payer: Self-pay | Admitting: Internal Medicine

## 2023-06-09 DIAGNOSIS — M5432 Sciatica, left side: Secondary | ICD-10-CM | POA: Diagnosis not present

## 2023-06-09 DIAGNOSIS — I4821 Permanent atrial fibrillation: Secondary | ICD-10-CM | POA: Diagnosis not present

## 2023-06-09 DIAGNOSIS — I878 Other specified disorders of veins: Secondary | ICD-10-CM | POA: Diagnosis not present

## 2023-06-09 DIAGNOSIS — I5022 Chronic systolic (congestive) heart failure: Secondary | ICD-10-CM | POA: Diagnosis not present

## 2023-06-20 DIAGNOSIS — S81811A Laceration without foreign body, right lower leg, initial encounter: Secondary | ICD-10-CM | POA: Diagnosis not present

## 2023-06-20 NOTE — Progress Notes (Signed)
 Remote pacemaker transmission.

## 2023-06-20 NOTE — Addendum Note (Signed)
 Addended by: Lott Rouleau A on: 06/20/2023 01:16 PM   Modules accepted: Orders

## 2023-07-07 DIAGNOSIS — I130 Hypertensive heart and chronic kidney disease with heart failure and stage 1 through stage 4 chronic kidney disease, or unspecified chronic kidney disease: Secondary | ICD-10-CM | POA: Diagnosis not present

## 2023-07-07 DIAGNOSIS — E871 Hypo-osmolality and hyponatremia: Secondary | ICD-10-CM | POA: Diagnosis not present

## 2023-07-07 DIAGNOSIS — D6869 Other thrombophilia: Secondary | ICD-10-CM | POA: Diagnosis not present

## 2023-07-07 DIAGNOSIS — M5432 Sciatica, left side: Secondary | ICD-10-CM | POA: Diagnosis not present

## 2023-07-07 DIAGNOSIS — N1831 Chronic kidney disease, stage 3a: Secondary | ICD-10-CM | POA: Diagnosis not present

## 2023-07-07 DIAGNOSIS — Z7409 Other reduced mobility: Secondary | ICD-10-CM | POA: Diagnosis not present

## 2023-07-07 DIAGNOSIS — I5022 Chronic systolic (congestive) heart failure: Secondary | ICD-10-CM | POA: Diagnosis not present

## 2023-07-10 DIAGNOSIS — I2510 Atherosclerotic heart disease of native coronary artery without angina pectoris: Secondary | ICD-10-CM | POA: Diagnosis not present

## 2023-07-10 DIAGNOSIS — I5022 Chronic systolic (congestive) heart failure: Secondary | ICD-10-CM | POA: Diagnosis not present

## 2023-07-10 DIAGNOSIS — N1831 Chronic kidney disease, stage 3a: Secondary | ICD-10-CM | POA: Diagnosis not present

## 2023-07-10 DIAGNOSIS — M5432 Sciatica, left side: Secondary | ICD-10-CM | POA: Diagnosis not present

## 2023-07-10 DIAGNOSIS — D631 Anemia in chronic kidney disease: Secondary | ICD-10-CM | POA: Diagnosis not present

## 2023-07-10 DIAGNOSIS — I872 Venous insufficiency (chronic) (peripheral): Secondary | ICD-10-CM | POA: Diagnosis not present

## 2023-07-10 DIAGNOSIS — I739 Peripheral vascular disease, unspecified: Secondary | ICD-10-CM | POA: Diagnosis not present

## 2023-07-10 DIAGNOSIS — I4821 Permanent atrial fibrillation: Secondary | ICD-10-CM | POA: Diagnosis not present

## 2023-07-10 DIAGNOSIS — I130 Hypertensive heart and chronic kidney disease with heart failure and stage 1 through stage 4 chronic kidney disease, or unspecified chronic kidney disease: Secondary | ICD-10-CM | POA: Diagnosis not present

## 2023-07-15 DIAGNOSIS — I872 Venous insufficiency (chronic) (peripheral): Secondary | ICD-10-CM | POA: Diagnosis not present

## 2023-07-15 DIAGNOSIS — I130 Hypertensive heart and chronic kidney disease with heart failure and stage 1 through stage 4 chronic kidney disease, or unspecified chronic kidney disease: Secondary | ICD-10-CM | POA: Diagnosis not present

## 2023-07-15 DIAGNOSIS — I5022 Chronic systolic (congestive) heart failure: Secondary | ICD-10-CM | POA: Diagnosis not present

## 2023-07-15 DIAGNOSIS — M5432 Sciatica, left side: Secondary | ICD-10-CM | POA: Diagnosis not present

## 2023-07-15 DIAGNOSIS — I739 Peripheral vascular disease, unspecified: Secondary | ICD-10-CM | POA: Diagnosis not present

## 2023-07-15 DIAGNOSIS — I4821 Permanent atrial fibrillation: Secondary | ICD-10-CM | POA: Diagnosis not present

## 2023-07-15 DIAGNOSIS — I2510 Atherosclerotic heart disease of native coronary artery without angina pectoris: Secondary | ICD-10-CM | POA: Diagnosis not present

## 2023-07-15 DIAGNOSIS — D631 Anemia in chronic kidney disease: Secondary | ICD-10-CM | POA: Diagnosis not present

## 2023-07-15 DIAGNOSIS — N1831 Chronic kidney disease, stage 3a: Secondary | ICD-10-CM | POA: Diagnosis not present

## 2023-07-18 DIAGNOSIS — I4821 Permanent atrial fibrillation: Secondary | ICD-10-CM | POA: Diagnosis not present

## 2023-07-18 DIAGNOSIS — I2510 Atherosclerotic heart disease of native coronary artery without angina pectoris: Secondary | ICD-10-CM | POA: Diagnosis not present

## 2023-07-18 DIAGNOSIS — I739 Peripheral vascular disease, unspecified: Secondary | ICD-10-CM | POA: Diagnosis not present

## 2023-07-18 DIAGNOSIS — I5022 Chronic systolic (congestive) heart failure: Secondary | ICD-10-CM | POA: Diagnosis not present

## 2023-07-18 DIAGNOSIS — N1831 Chronic kidney disease, stage 3a: Secondary | ICD-10-CM | POA: Diagnosis not present

## 2023-07-18 DIAGNOSIS — M5432 Sciatica, left side: Secondary | ICD-10-CM | POA: Diagnosis not present

## 2023-07-18 DIAGNOSIS — I872 Venous insufficiency (chronic) (peripheral): Secondary | ICD-10-CM | POA: Diagnosis not present

## 2023-07-18 DIAGNOSIS — I130 Hypertensive heart and chronic kidney disease with heart failure and stage 1 through stage 4 chronic kidney disease, or unspecified chronic kidney disease: Secondary | ICD-10-CM | POA: Diagnosis not present

## 2023-07-18 DIAGNOSIS — D631 Anemia in chronic kidney disease: Secondary | ICD-10-CM | POA: Diagnosis not present

## 2023-07-23 DIAGNOSIS — I130 Hypertensive heart and chronic kidney disease with heart failure and stage 1 through stage 4 chronic kidney disease, or unspecified chronic kidney disease: Secondary | ICD-10-CM | POA: Diagnosis not present

## 2023-07-23 DIAGNOSIS — I5022 Chronic systolic (congestive) heart failure: Secondary | ICD-10-CM | POA: Diagnosis not present

## 2023-07-23 DIAGNOSIS — I739 Peripheral vascular disease, unspecified: Secondary | ICD-10-CM | POA: Diagnosis not present

## 2023-07-23 DIAGNOSIS — N1831 Chronic kidney disease, stage 3a: Secondary | ICD-10-CM | POA: Diagnosis not present

## 2023-07-23 DIAGNOSIS — I2510 Atherosclerotic heart disease of native coronary artery without angina pectoris: Secondary | ICD-10-CM | POA: Diagnosis not present

## 2023-07-23 DIAGNOSIS — M5432 Sciatica, left side: Secondary | ICD-10-CM | POA: Diagnosis not present

## 2023-07-23 DIAGNOSIS — I4821 Permanent atrial fibrillation: Secondary | ICD-10-CM | POA: Diagnosis not present

## 2023-07-23 DIAGNOSIS — D631 Anemia in chronic kidney disease: Secondary | ICD-10-CM | POA: Diagnosis not present

## 2023-07-23 DIAGNOSIS — I872 Venous insufficiency (chronic) (peripheral): Secondary | ICD-10-CM | POA: Diagnosis not present

## 2023-07-25 DIAGNOSIS — I130 Hypertensive heart and chronic kidney disease with heart failure and stage 1 through stage 4 chronic kidney disease, or unspecified chronic kidney disease: Secondary | ICD-10-CM | POA: Diagnosis not present

## 2023-07-25 DIAGNOSIS — I4821 Permanent atrial fibrillation: Secondary | ICD-10-CM | POA: Diagnosis not present

## 2023-07-25 DIAGNOSIS — I2510 Atherosclerotic heart disease of native coronary artery without angina pectoris: Secondary | ICD-10-CM | POA: Diagnosis not present

## 2023-07-25 DIAGNOSIS — I872 Venous insufficiency (chronic) (peripheral): Secondary | ICD-10-CM | POA: Diagnosis not present

## 2023-07-25 DIAGNOSIS — N1831 Chronic kidney disease, stage 3a: Secondary | ICD-10-CM | POA: Diagnosis not present

## 2023-07-25 DIAGNOSIS — I739 Peripheral vascular disease, unspecified: Secondary | ICD-10-CM | POA: Diagnosis not present

## 2023-07-25 DIAGNOSIS — I5022 Chronic systolic (congestive) heart failure: Secondary | ICD-10-CM | POA: Diagnosis not present

## 2023-07-25 DIAGNOSIS — M5432 Sciatica, left side: Secondary | ICD-10-CM | POA: Diagnosis not present

## 2023-07-25 DIAGNOSIS — D631 Anemia in chronic kidney disease: Secondary | ICD-10-CM | POA: Diagnosis not present

## 2023-07-28 DIAGNOSIS — N1831 Chronic kidney disease, stage 3a: Secondary | ICD-10-CM | POA: Diagnosis not present

## 2023-07-28 DIAGNOSIS — I4821 Permanent atrial fibrillation: Secondary | ICD-10-CM | POA: Diagnosis not present

## 2023-07-28 DIAGNOSIS — I739 Peripheral vascular disease, unspecified: Secondary | ICD-10-CM | POA: Diagnosis not present

## 2023-07-28 DIAGNOSIS — I872 Venous insufficiency (chronic) (peripheral): Secondary | ICD-10-CM | POA: Diagnosis not present

## 2023-07-28 DIAGNOSIS — I2510 Atherosclerotic heart disease of native coronary artery without angina pectoris: Secondary | ICD-10-CM | POA: Diagnosis not present

## 2023-07-28 DIAGNOSIS — I130 Hypertensive heart and chronic kidney disease with heart failure and stage 1 through stage 4 chronic kidney disease, or unspecified chronic kidney disease: Secondary | ICD-10-CM | POA: Diagnosis not present

## 2023-07-28 DIAGNOSIS — D631 Anemia in chronic kidney disease: Secondary | ICD-10-CM | POA: Diagnosis not present

## 2023-07-28 DIAGNOSIS — I5022 Chronic systolic (congestive) heart failure: Secondary | ICD-10-CM | POA: Diagnosis not present

## 2023-07-28 DIAGNOSIS — M5432 Sciatica, left side: Secondary | ICD-10-CM | POA: Diagnosis not present

## 2023-07-31 DIAGNOSIS — I5022 Chronic systolic (congestive) heart failure: Secondary | ICD-10-CM | POA: Diagnosis not present

## 2023-07-31 DIAGNOSIS — Z130 Encounter for screening for diseases of the blood and blood-forming organs and certain disorders involving the immune mechanism: Secondary | ICD-10-CM | POA: Diagnosis not present

## 2023-07-31 DIAGNOSIS — I872 Venous insufficiency (chronic) (peripheral): Secondary | ICD-10-CM | POA: Diagnosis not present

## 2023-07-31 DIAGNOSIS — Z0000 Encounter for general adult medical examination without abnormal findings: Secondary | ICD-10-CM | POA: Diagnosis not present

## 2023-07-31 DIAGNOSIS — I2510 Atherosclerotic heart disease of native coronary artery without angina pectoris: Secondary | ICD-10-CM | POA: Diagnosis not present

## 2023-07-31 DIAGNOSIS — I130 Hypertensive heart and chronic kidney disease with heart failure and stage 1 through stage 4 chronic kidney disease, or unspecified chronic kidney disease: Secondary | ICD-10-CM | POA: Diagnosis not present

## 2023-07-31 DIAGNOSIS — I4821 Permanent atrial fibrillation: Secondary | ICD-10-CM | POA: Diagnosis not present

## 2023-07-31 DIAGNOSIS — I739 Peripheral vascular disease, unspecified: Secondary | ICD-10-CM | POA: Diagnosis not present

## 2023-07-31 DIAGNOSIS — M5432 Sciatica, left side: Secondary | ICD-10-CM | POA: Diagnosis not present

## 2023-07-31 DIAGNOSIS — E7800 Pure hypercholesterolemia, unspecified: Secondary | ICD-10-CM | POA: Diagnosis not present

## 2023-07-31 DIAGNOSIS — I119 Hypertensive heart disease without heart failure: Secondary | ICD-10-CM | POA: Diagnosis not present

## 2023-07-31 DIAGNOSIS — N1831 Chronic kidney disease, stage 3a: Secondary | ICD-10-CM | POA: Diagnosis not present

## 2023-07-31 DIAGNOSIS — I509 Heart failure, unspecified: Secondary | ICD-10-CM | POA: Diagnosis not present

## 2023-07-31 DIAGNOSIS — R799 Abnormal finding of blood chemistry, unspecified: Secondary | ICD-10-CM | POA: Diagnosis not present

## 2023-07-31 DIAGNOSIS — D631 Anemia in chronic kidney disease: Secondary | ICD-10-CM | POA: Diagnosis not present

## 2023-08-06 ENCOUNTER — Encounter: Payer: Self-pay | Admitting: Cardiovascular Disease

## 2023-08-06 DIAGNOSIS — N1831 Chronic kidney disease, stage 3a: Secondary | ICD-10-CM | POA: Diagnosis not present

## 2023-08-06 DIAGNOSIS — M5432 Sciatica, left side: Secondary | ICD-10-CM | POA: Diagnosis not present

## 2023-08-06 DIAGNOSIS — I5022 Chronic systolic (congestive) heart failure: Secondary | ICD-10-CM | POA: Diagnosis not present

## 2023-08-06 DIAGNOSIS — D631 Anemia in chronic kidney disease: Secondary | ICD-10-CM | POA: Diagnosis not present

## 2023-08-06 DIAGNOSIS — I2510 Atherosclerotic heart disease of native coronary artery without angina pectoris: Secondary | ICD-10-CM | POA: Diagnosis not present

## 2023-08-06 DIAGNOSIS — I4821 Permanent atrial fibrillation: Secondary | ICD-10-CM | POA: Diagnosis not present

## 2023-08-06 DIAGNOSIS — I130 Hypertensive heart and chronic kidney disease with heart failure and stage 1 through stage 4 chronic kidney disease, or unspecified chronic kidney disease: Secondary | ICD-10-CM | POA: Diagnosis not present

## 2023-08-06 DIAGNOSIS — I872 Venous insufficiency (chronic) (peripheral): Secondary | ICD-10-CM | POA: Diagnosis not present

## 2023-08-06 DIAGNOSIS — I739 Peripheral vascular disease, unspecified: Secondary | ICD-10-CM | POA: Diagnosis not present

## 2023-08-07 ENCOUNTER — Ambulatory Visit (INDEPENDENT_AMBULATORY_CARE_PROVIDER_SITE_OTHER)

## 2023-08-07 DIAGNOSIS — I5032 Chronic diastolic (congestive) heart failure: Secondary | ICD-10-CM

## 2023-08-07 LAB — CUP PACEART REMOTE DEVICE CHECK
Battery Remaining Longevity: 62 mo
Battery Voltage: 2.96 V
Brady Statistic RV Percent Paced: 98.44 %
Date Time Interrogation Session: 20250612004339
Implantable Lead Connection Status: 753985
Implantable Lead Implant Date: 20190522
Implantable Lead Location: 753860
Implantable Lead Model: 5076
Implantable Pulse Generator Implant Date: 20190522
Lead Channel Impedance Value: 304 Ohm
Lead Channel Impedance Value: 437 Ohm
Lead Channel Pacing Threshold Amplitude: 0.75 V
Lead Channel Pacing Threshold Pulse Width: 0.4 ms
Lead Channel Sensing Intrinsic Amplitude: 8.375 mV
Lead Channel Sensing Intrinsic Amplitude: 8.375 mV
Lead Channel Setting Pacing Amplitude: 2.5 V
Lead Channel Setting Pacing Pulse Width: 0.4 ms
Lead Channel Setting Sensing Sensitivity: 1.2 mV
Zone Setting Status: 755011

## 2023-08-08 DIAGNOSIS — I739 Peripheral vascular disease, unspecified: Secondary | ICD-10-CM | POA: Diagnosis not present

## 2023-08-08 DIAGNOSIS — I5022 Chronic systolic (congestive) heart failure: Secondary | ICD-10-CM | POA: Diagnosis not present

## 2023-08-08 DIAGNOSIS — N1831 Chronic kidney disease, stage 3a: Secondary | ICD-10-CM | POA: Diagnosis not present

## 2023-08-08 DIAGNOSIS — M5432 Sciatica, left side: Secondary | ICD-10-CM | POA: Diagnosis not present

## 2023-08-08 DIAGNOSIS — I130 Hypertensive heart and chronic kidney disease with heart failure and stage 1 through stage 4 chronic kidney disease, or unspecified chronic kidney disease: Secondary | ICD-10-CM | POA: Diagnosis not present

## 2023-08-08 DIAGNOSIS — I872 Venous insufficiency (chronic) (peripheral): Secondary | ICD-10-CM | POA: Diagnosis not present

## 2023-08-08 DIAGNOSIS — I4821 Permanent atrial fibrillation: Secondary | ICD-10-CM | POA: Diagnosis not present

## 2023-08-08 DIAGNOSIS — I2510 Atherosclerotic heart disease of native coronary artery without angina pectoris: Secondary | ICD-10-CM | POA: Diagnosis not present

## 2023-08-08 DIAGNOSIS — D631 Anemia in chronic kidney disease: Secondary | ICD-10-CM | POA: Diagnosis not present

## 2023-08-11 DIAGNOSIS — Z23 Encounter for immunization: Secondary | ICD-10-CM | POA: Diagnosis not present

## 2023-08-11 DIAGNOSIS — R54 Age-related physical debility: Secondary | ICD-10-CM | POA: Diagnosis not present

## 2023-08-11 DIAGNOSIS — R748 Abnormal levels of other serum enzymes: Secondary | ICD-10-CM | POA: Diagnosis not present

## 2023-08-11 DIAGNOSIS — Z7189 Other specified counseling: Secondary | ICD-10-CM | POA: Diagnosis not present

## 2023-08-11 DIAGNOSIS — I130 Hypertensive heart and chronic kidney disease with heart failure and stage 1 through stage 4 chronic kidney disease, or unspecified chronic kidney disease: Secondary | ICD-10-CM | POA: Diagnosis not present

## 2023-08-11 DIAGNOSIS — Z7409 Other reduced mobility: Secondary | ICD-10-CM | POA: Diagnosis not present

## 2023-08-11 DIAGNOSIS — N1832 Chronic kidney disease, stage 3b: Secondary | ICD-10-CM | POA: Diagnosis not present

## 2023-08-14 DIAGNOSIS — I5022 Chronic systolic (congestive) heart failure: Secondary | ICD-10-CM | POA: Diagnosis not present

## 2023-08-14 DIAGNOSIS — I872 Venous insufficiency (chronic) (peripheral): Secondary | ICD-10-CM | POA: Diagnosis not present

## 2023-08-14 DIAGNOSIS — I739 Peripheral vascular disease, unspecified: Secondary | ICD-10-CM | POA: Diagnosis not present

## 2023-08-14 DIAGNOSIS — I2510 Atherosclerotic heart disease of native coronary artery without angina pectoris: Secondary | ICD-10-CM | POA: Diagnosis not present

## 2023-08-14 DIAGNOSIS — I130 Hypertensive heart and chronic kidney disease with heart failure and stage 1 through stage 4 chronic kidney disease, or unspecified chronic kidney disease: Secondary | ICD-10-CM | POA: Diagnosis not present

## 2023-08-14 DIAGNOSIS — I4821 Permanent atrial fibrillation: Secondary | ICD-10-CM | POA: Diagnosis not present

## 2023-08-14 DIAGNOSIS — M5432 Sciatica, left side: Secondary | ICD-10-CM | POA: Diagnosis not present

## 2023-08-14 DIAGNOSIS — D631 Anemia in chronic kidney disease: Secondary | ICD-10-CM | POA: Diagnosis not present

## 2023-08-14 DIAGNOSIS — N1831 Chronic kidney disease, stage 3a: Secondary | ICD-10-CM | POA: Diagnosis not present

## 2023-08-15 DIAGNOSIS — I2510 Atherosclerotic heart disease of native coronary artery without angina pectoris: Secondary | ICD-10-CM | POA: Diagnosis not present

## 2023-08-15 DIAGNOSIS — I739 Peripheral vascular disease, unspecified: Secondary | ICD-10-CM | POA: Diagnosis not present

## 2023-08-15 DIAGNOSIS — M5432 Sciatica, left side: Secondary | ICD-10-CM | POA: Diagnosis not present

## 2023-08-15 DIAGNOSIS — D631 Anemia in chronic kidney disease: Secondary | ICD-10-CM | POA: Diagnosis not present

## 2023-08-15 DIAGNOSIS — I872 Venous insufficiency (chronic) (peripheral): Secondary | ICD-10-CM | POA: Diagnosis not present

## 2023-08-15 DIAGNOSIS — I130 Hypertensive heart and chronic kidney disease with heart failure and stage 1 through stage 4 chronic kidney disease, or unspecified chronic kidney disease: Secondary | ICD-10-CM | POA: Diagnosis not present

## 2023-08-15 DIAGNOSIS — I5022 Chronic systolic (congestive) heart failure: Secondary | ICD-10-CM | POA: Diagnosis not present

## 2023-08-15 DIAGNOSIS — I4821 Permanent atrial fibrillation: Secondary | ICD-10-CM | POA: Diagnosis not present

## 2023-08-15 DIAGNOSIS — N1831 Chronic kidney disease, stage 3a: Secondary | ICD-10-CM | POA: Diagnosis not present

## 2023-08-20 ENCOUNTER — Ambulatory Visit: Payer: Self-pay | Admitting: Cardiovascular Disease

## 2023-08-22 DIAGNOSIS — I130 Hypertensive heart and chronic kidney disease with heart failure and stage 1 through stage 4 chronic kidney disease, or unspecified chronic kidney disease: Secondary | ICD-10-CM | POA: Diagnosis not present

## 2023-08-22 DIAGNOSIS — D631 Anemia in chronic kidney disease: Secondary | ICD-10-CM | POA: Diagnosis not present

## 2023-08-22 DIAGNOSIS — N1831 Chronic kidney disease, stage 3a: Secondary | ICD-10-CM | POA: Diagnosis not present

## 2023-08-22 DIAGNOSIS — I2510 Atherosclerotic heart disease of native coronary artery without angina pectoris: Secondary | ICD-10-CM | POA: Diagnosis not present

## 2023-08-22 DIAGNOSIS — I739 Peripheral vascular disease, unspecified: Secondary | ICD-10-CM | POA: Diagnosis not present

## 2023-08-22 DIAGNOSIS — M5432 Sciatica, left side: Secondary | ICD-10-CM | POA: Diagnosis not present

## 2023-08-22 DIAGNOSIS — I872 Venous insufficiency (chronic) (peripheral): Secondary | ICD-10-CM | POA: Diagnosis not present

## 2023-08-22 DIAGNOSIS — I5022 Chronic systolic (congestive) heart failure: Secondary | ICD-10-CM | POA: Diagnosis not present

## 2023-08-22 DIAGNOSIS — I4821 Permanent atrial fibrillation: Secondary | ICD-10-CM | POA: Diagnosis not present

## 2023-08-28 DIAGNOSIS — I872 Venous insufficiency (chronic) (peripheral): Secondary | ICD-10-CM | POA: Diagnosis not present

## 2023-08-28 DIAGNOSIS — N1831 Chronic kidney disease, stage 3a: Secondary | ICD-10-CM | POA: Diagnosis not present

## 2023-08-28 DIAGNOSIS — I5022 Chronic systolic (congestive) heart failure: Secondary | ICD-10-CM | POA: Diagnosis not present

## 2023-08-28 DIAGNOSIS — I2510 Atherosclerotic heart disease of native coronary artery without angina pectoris: Secondary | ICD-10-CM | POA: Diagnosis not present

## 2023-08-28 DIAGNOSIS — I4821 Permanent atrial fibrillation: Secondary | ICD-10-CM | POA: Diagnosis not present

## 2023-08-28 DIAGNOSIS — I130 Hypertensive heart and chronic kidney disease with heart failure and stage 1 through stage 4 chronic kidney disease, or unspecified chronic kidney disease: Secondary | ICD-10-CM | POA: Diagnosis not present

## 2023-08-28 DIAGNOSIS — M5432 Sciatica, left side: Secondary | ICD-10-CM | POA: Diagnosis not present

## 2023-08-28 DIAGNOSIS — I739 Peripheral vascular disease, unspecified: Secondary | ICD-10-CM | POA: Diagnosis not present

## 2023-08-28 DIAGNOSIS — D631 Anemia in chronic kidney disease: Secondary | ICD-10-CM | POA: Diagnosis not present

## 2023-09-05 DIAGNOSIS — D631 Anemia in chronic kidney disease: Secondary | ICD-10-CM | POA: Diagnosis not present

## 2023-09-05 DIAGNOSIS — M5432 Sciatica, left side: Secondary | ICD-10-CM | POA: Diagnosis not present

## 2023-09-05 DIAGNOSIS — I4821 Permanent atrial fibrillation: Secondary | ICD-10-CM | POA: Diagnosis not present

## 2023-09-05 DIAGNOSIS — I2510 Atherosclerotic heart disease of native coronary artery without angina pectoris: Secondary | ICD-10-CM | POA: Diagnosis not present

## 2023-09-05 DIAGNOSIS — I5022 Chronic systolic (congestive) heart failure: Secondary | ICD-10-CM | POA: Diagnosis not present

## 2023-09-05 DIAGNOSIS — N1831 Chronic kidney disease, stage 3a: Secondary | ICD-10-CM | POA: Diagnosis not present

## 2023-09-05 DIAGNOSIS — I739 Peripheral vascular disease, unspecified: Secondary | ICD-10-CM | POA: Diagnosis not present

## 2023-09-05 DIAGNOSIS — I130 Hypertensive heart and chronic kidney disease with heart failure and stage 1 through stage 4 chronic kidney disease, or unspecified chronic kidney disease: Secondary | ICD-10-CM | POA: Diagnosis not present

## 2023-09-05 DIAGNOSIS — I872 Venous insufficiency (chronic) (peripheral): Secondary | ICD-10-CM | POA: Diagnosis not present

## 2023-09-28 ENCOUNTER — Emergency Department (HOSPITAL_COMMUNITY)
Admission: EM | Admit: 2023-09-28 | Discharge: 2023-09-28 | Disposition: A | Discharge status: Home / Self Care | Attending: Student in an Organized Health Care Education/Training Program | Admitting: Student in an Organized Health Care Education/Training Program

## 2023-09-28 DIAGNOSIS — N390 Urinary tract infection, site not specified: Secondary | ICD-10-CM | POA: Diagnosis present

## 2023-09-28 DIAGNOSIS — Z7901 Long term (current) use of anticoagulants: Secondary | ICD-10-CM | POA: Insufficient documentation

## 2023-09-28 DIAGNOSIS — I509 Heart failure, unspecified: Secondary | ICD-10-CM | POA: Insufficient documentation

## 2023-09-28 DIAGNOSIS — Z87440 Personal history of urinary (tract) infections: Secondary | ICD-10-CM | POA: Diagnosis not present

## 2023-09-28 DIAGNOSIS — R531 Weakness: Secondary | ICD-10-CM | POA: Insufficient documentation

## 2023-09-28 DIAGNOSIS — M545 Low back pain, unspecified: Secondary | ICD-10-CM | POA: Diagnosis present

## 2023-09-28 DIAGNOSIS — R7989 Other specified abnormal findings of blood chemistry: Secondary | ICD-10-CM | POA: Diagnosis not present

## 2023-09-28 DIAGNOSIS — R3989 Other symptoms and signs involving the genitourinary system: Secondary | ICD-10-CM | POA: Diagnosis not present

## 2023-09-28 DIAGNOSIS — Z711 Person with feared health complaint in whom no diagnosis is made: Secondary | ICD-10-CM

## 2023-09-28 LAB — COMPREHENSIVE METABOLIC PANEL WITH GFR
ALT: 26 U/L (ref 0–44)
AST: 37 U/L (ref 15–41)
Albumin: 3.6 g/dL (ref 3.5–5.0)
Alkaline Phosphatase: 105 U/L (ref 38–126)
Anion gap: 12 (ref 5–15)
BUN: 21 mg/dL (ref 8–23)
CO2: 21 mmol/L — ABNORMAL LOW (ref 22–32)
Calcium: 10.4 mg/dL — ABNORMAL HIGH (ref 8.9–10.3)
Chloride: 109 mmol/L (ref 98–111)
Creatinine, Ser: 1.16 mg/dL — ABNORMAL HIGH (ref 0.44–1.00)
GFR, Estimated: 43 mL/min — ABNORMAL LOW (ref >60)
Glucose, Bld: 103 mg/dL — ABNORMAL HIGH (ref 70–99)
Potassium: 3.5 mmol/L (ref 3.5–5.1)
Sodium: 142 mmol/L (ref 135–145)
Total Bilirubin: 0.8 mg/dL (ref 0.0–1.2)
Total Protein: 6.8 g/dL (ref 6.5–8.1)

## 2023-09-28 LAB — CBC WITH DIFFERENTIAL/PLATELET
Abs Immature Granulocytes: 0.01 K/uL (ref 0.00–0.07)
Basophils Absolute: 0 K/uL (ref 0.0–0.1)
Basophils Relative: 0 %
Eosinophils Absolute: 0 K/uL (ref 0.0–0.5)
Eosinophils Relative: 1 %
HCT: 34.9 % — ABNORMAL LOW (ref 36.0–46.0)
Hemoglobin: 11.5 g/dL — ABNORMAL LOW (ref 12.0–15.0)
Immature Granulocytes: 0 %
Lymphocytes Relative: 30 %
Lymphs Abs: 1.1 K/uL (ref 0.7–4.0)
MCH: 30.8 pg (ref 26.0–34.0)
MCHC: 33 g/dL (ref 30.0–36.0)
MCV: 93.6 fL (ref 80.0–100.0)
Monocytes Absolute: 0.3 K/uL (ref 0.1–1.0)
Monocytes Relative: 9 %
Neutro Abs: 2.2 K/uL (ref 1.7–7.7)
Neutrophils Relative %: 60 %
Platelets: 181 K/uL (ref 150–400)
RBC: 3.73 MIL/uL — ABNORMAL LOW (ref 3.87–5.11)
RDW: 15.1 % (ref 11.5–15.5)
WBC: 3.6 K/uL — ABNORMAL LOW (ref 4.0–10.5)
nRBC: 0 % (ref 0.0–0.2)

## 2023-09-28 LAB — URINALYSIS, ROUTINE W REFLEX MICROSCOPIC
Bacteria, UA: NONE SEEN
Bilirubin Urine: NEGATIVE
Glucose, UA: NEGATIVE mg/dL
Hgb urine dipstick: NEGATIVE
Ketones, ur: NEGATIVE mg/dL
Nitrite: NEGATIVE
Protein, ur: NEGATIVE mg/dL
Specific Gravity, Urine: 1.004 — ABNORMAL LOW (ref 1.005–1.030)
pH: 7 (ref 5.0–8.0)

## 2023-09-28 LAB — BRAIN NATRIURETIC PEPTIDE: B Natriuretic Peptide: 268.8 pg/mL — ABNORMAL HIGH (ref 0.0–100.0)

## 2023-09-28 MED ORDER — LACTATED RINGERS IV BOLUS
500.0000 mL | Freq: Once | INTRAVENOUS | Status: AC
  Administered 2023-09-28: 500 mL via INTRAVENOUS

## 2023-09-28 MED ORDER — MORPHINE SULFATE (PF) 2 MG/ML IV SOLN
2.0000 mg | Freq: Once | INTRAVENOUS | Status: AC
  Administered 2023-09-28: 2 mg via INTRAVENOUS
  Filled 2023-09-28: qty 1

## 2023-09-28 NOTE — Discharge Instructions (Signed)
 You were evaluated in the emergency department today for urinary issue.  Based on your evaluation, there is no evidence of a serious or life-threatening condition at this time.  However, we recommend that you continue closely monitoring your symptoms and arrange close follow-up with your primary care provider for reevaluation.  Your symptoms may improve on their own, but we recommend follow-up or return to the emergency department if they continue/worsen.  Follow-up: - Follow-up with your primary care provider or a specialist if your symptoms persist, worsen, or you have additional concerns.   - We typically recommend follow-up with your PCP within the next 1-3 days, unless directed otherwise by your medical provider. - If a follow-up appointment has already been scheduled, we recommend you continue to keep that appointment to discuss your recent ED visit  Return to the emergency department if you experience: - Worsening or new symptoms - New fever or fever that persists - Difficulty breathing, chest pain, severe pain, or weakness. - Signs of infection at any wound site (increased redness, warmth, discharge) - You have any other concerns or unexpected changes  Medications: - Pain relief: Use over-the-counter pain relievers like ibuprofen or Tylenol  as directed on the package.  Talk to your medical provider if you have certain medical conditions like kidney disease or liver disease to make sure these medications are safe for you to use. - Continue to take all your medications as directed.  Contact your primary care physician (or seek medical care from a medical provider) if you have any side effects or questions  Other instructions: - Drink plenty of fluids to stay hydrated (we recommend water or fluids that contain electrolytes including Gatorade/Powerade/Pedialyte) - Continue to rest and care for yourself at home - Avoid strenuous activity if feeling unwell - If applicable: consider applying  ice packs or a heating pad to the affected area of injury for 20 minutes at a time, 4-5 times a day, for the next 24-48 hours.   Once again, we highly encourage you to contact your primary care provider or return to the emergency department if you have any further concerns or questions.

## 2023-09-28 NOTE — ED Provider Notes (Signed)
  EMERGENCY DEPARTMENT AT [facility] Provider Note   CSN: [id number] Arrival date & time: 09/28/23  1727     Patient presents with: Urinary Tract Infection   Stephanie Atkinson is a 89 y.o. female.   89 year old female brought to the emergency department by her daughter due to urinary concerns.  They report that the patient has had dark and foul smelling urine for the last few days.  She has also noticed increased weakness and mild low back discomfort.  She denies any pain with urination but reports increased pressure when urinating.  Daughter reports a history of frequent UTIs.  Last UTI was in December of last year.  Patient lives alone and walks around with a walker.  She has been functioning normally at home.  They deny any known fevers.   Urinary Tract Infection      Prior to Admission medications   Medication Sig Start Date End Date Taking? Authorizing Provider  acetaminophen  (TYLENOL ) 500 MG tablet Take 500 mg by mouth See admin instructions. Take 500 mg in am, 1000 mg at 2 pm, 1000 mg 6 pm and 500 mg in pm by mouth.    [provider]  apixaban  (ELIQUIS ) 2.5 MG TABS tablet Take 1 tablet (2.5 mg total) by mouth 2 (two) times daily. 04/24/23   Leotis Bogus, MD  atorvastatin  (LIPITOR ) 80 MG tablet Take 80 mg by mouth at bedtime.     [provider]  Diclofenac  Sodium (VOLTAREN  EX) Apply 1 Dose topically daily as needed (Pain).    [provider]  fluticasone  (FLONASE ) 50 MCG/ACT nasal spray Place 2 sprays into both nostrils daily. 04/25/23 05/25/23  Leotis Bogus, MD  levothyroxine  (SYNTHROID , LEVOTHROID) 50 MCG tablet Take 50 mcg by mouth daily before breakfast.     [provider]  lidocaine  (LIDODERM ) 5 % Place 1 patch onto the skin daily as needed (Pain). 02/25/23   [provider]  loratadine  (CLARITIN ) 10 MG tablet Take 1 tablet (10 mg total) by mouth daily. 04/25/23 05/25/23  Leotis Bogus, MD  losartan   (COZAAR ) 25 MG tablet Take 1 tablet (25 mg total) by mouth daily. 04/25/23 05/25/23  Leotis Bogus, MD  metoprolol  succinate (TOPROL -XL) 25 MG 24 hr tablet Take 0.5 tablets (12.5 mg total) by mouth daily. 04/25/23 06/24/23  Leotis Bogus, MD  Polyethyl Glycol-Propyl Glycol (SYSTANE OP) Place 1 drop into both eyes daily as needed (dry eye).    [provider]  torsemide  (DEMADEX ) 20 MG tablet Take 1 tablet (20 mg total) by mouth daily. 04/24/23 05/24/23  Leotis Bogus, MD    Allergies: Penicillins, Sulfa antibiotics, Gabapentin , and Lyrica [pregabalin]    Review of Systems  All other systems reviewed and are negative.   Updated Vital Signs BP (!) 166/55   Pulse 62   Temp 99 F (37.2 C) (Oral)   Resp 20   SpO2 100%   Physical Exam Vitals reviewed.  Constitutional:      Appearance: She is not toxic-appearing.  HENT:     Head: Normocephalic.     Nose: Nose normal.     Mouth/Throat:     Mouth: Mucous membranes are moist.  Eyes:     Conjunctiva/sclera: Conjunctivae normal.  Cardiovascular:     Rate and Rhythm: Normal rate.  Pulmonary:     Effort: Pulmonary effort is normal.  Abdominal:     General: Abdomen is flat. There is no distension.  Musculoskeletal:     Cervical back: Neck supple.  Right lower leg: No edema.     Left lower leg: No edema.  Skin:    General: Skin is warm.  Neurological:     Mental Status: She is alert. Mental status is at baseline.     (all labs ordered are listed, but only abnormal results are displayed) Labs Reviewed  URINALYSIS, ROUTINE W REFLEX MICROSCOPIC - Abnormal; Notable for the following components:      Result Value   Color, Urine COLORLESS (*)    Specific Gravity, Urine 1.004 (*)    Leukocytes,Ua TRACE (*)    All other components within normal limits  COMPREHENSIVE METABOLIC PANEL WITH GFR - Abnormal; Notable for the following components:   CO2 21 (*)    Glucose, Bld 103 (*)    Creatinine, Ser 1.16 (*)    Calcium  10.4  (*)    GFR, Estimated 43 (*)    All other components within normal limits  CBC WITH DIFFERENTIAL/PLATELET - Abnormal; Notable for the following components:   WBC 3.6 (*)    RBC 3.73 (*)    Hemoglobin 11.5 (*)    HCT 34.9 (*)    All other components within normal limits  BRAIN NATRIURETIC PEPTIDE - Abnormal; Notable for the following components:   B Natriuretic Peptide 268.8 (*)    All other components within normal limits  CULTURE, BLOOD (ROUTINE X 2)  CULTURE, BLOOD (ROUTINE X 2)  URINE CULTURE    EKG: None  Radiology: No results found.   Procedures   Medications Ordered in the ED  morphine  (PF) 2 MG/ML injection 2 mg (2 mg Intravenous Given 09/28/23 1930)  lactated ringers  bolus 500 mL (0 mLs Intravenous Stopped 09/28/23 2006)                                    Medical Decision Making 89 year old female presenting to the emergency department for evaluation of her urinary symptoms.  She is well-appearing and answering questions appropriately.  Vitals are stable here in the emergency department.  She reports increased pressure and daughter at bedside admits to a history of UTIs.  Daughter also reports that the patient has a history of CHF and is concerned about fluid overload with the increased pressure sensation.  She is denying any increased leg swelling, increased abdominal distention, weight gain, or shortness of breath. Patient does admit to increased weakness but is still ambulatory and functional at home.  We have given her some pain medication for her low back discomfort while we get her urinalysis and blood work performed. Patient's urinalysis did not show evidence of a UTI.  She had a slight increase in her creatinine but IV fluids were given.  The smell in her urine could be secondary to dehydration.  She does have slightly elevated BNP - this is lower than her prior.  White blood cell count is within normal limits.  Patient stable for discharge at this time.  All  questions were answered.  Amount and/or Complexity of Data Reviewed Labs: ordered.  Risk Prescription drug management.     Final diagnoses:  Concern about urinary tract disease without diagnosis    ED Discharge Orders     None          Stephanie Kepner, DO 09/28/23 2052

## 2023-09-28 NOTE — ED Triage Notes (Signed)
 According to [facility]: aproximately over the last 3 days urine has been dark, cloudy, and foul smelling. Pt stats she has increased ffrequency and it feels heavy. Pt also reports no radiating back pain. Pt lives at home, is able to ambulate self. Pt remains Aox4.  Pt reports she has a dnr but lost it and would like a new one. Pt says daughter is healthcare power of attorney without paper work.   Vitals: 152/74 Hr 88 RR 15 Spo2 99%  Cbg 140 Temp 97.65F

## 2023-09-29 LAB — URINE CULTURE: Culture: <10000 — AB

## 2023-09-29 NOTE — Progress Notes (Signed)
 [facility] Liaison Note  Stephanie Atkinson currently receives [facility] Based Primary Care as well as Outpatient Palliative Care services.  Please contact our liaisons for any questions.    Eleanor Nail, LPN [facility] Liaison [phone]

## 2023-09-30 NOTE — Progress Notes (Signed)
 Remote pacemaker transmission.

## 2023-10-03 LAB — CULTURE, BLOOD (ROUTINE X 2): Culture: NO GROWTH

## 2023-11-06 ENCOUNTER — Ambulatory Visit

## 2023-11-06 DIAGNOSIS — I5032 Chronic diastolic (congestive) heart failure: Secondary | ICD-10-CM | POA: Diagnosis not present

## 2023-11-06 LAB — CUP PACEART REMOTE DEVICE CHECK
Battery Remaining Longevity: 56 mo
Battery Voltage: 2.96 V
Brady Statistic RV Percent Paced: 99.29 %
Date Time Interrogation Session: 20250911004548
Implantable Lead Connection Status: 753985
Implantable Lead Implant Date: 20190522
Implantable Lead Location: 753860
Implantable Lead Model: 5076
Implantable Pulse Generator Implant Date: 20190522
Lead Channel Impedance Value: 304 Ohm
Lead Channel Impedance Value: 418 Ohm
Lead Channel Pacing Threshold Amplitude: 0.75 V
Lead Channel Pacing Threshold Pulse Width: 0.4 ms
Lead Channel Sensing Intrinsic Amplitude: 8.75 mV
Lead Channel Sensing Intrinsic Amplitude: 8.75 mV
Lead Channel Setting Pacing Amplitude: 2.5 V
Lead Channel Setting Pacing Pulse Width: 0.4 ms
Lead Channel Setting Sensing Sensitivity: 1.2 mV
Zone Setting Status: 755011

## 2023-11-09 ENCOUNTER — Ambulatory Visit: Payer: Self-pay | Admitting: Cardiovascular Disease

## 2023-11-14 NOTE — Progress Notes (Signed)
 Remote PPM Transmission

## 2023-12-20 ENCOUNTER — Emergency Department (HOSPITAL_COMMUNITY)

## 2023-12-20 ENCOUNTER — Inpatient Hospital Stay (HOSPITAL_COMMUNITY)
Admission: EM | Admit: 2023-12-20 | Discharge: 2023-12-24 | DRG: 481 | Disposition: A | Discharge status: Skilled Nursing Facility | Attending: Internal Medicine | Admitting: Internal Medicine

## 2023-12-20 ENCOUNTER — Other Ambulatory Visit: Payer: Self-pay

## 2023-12-20 ENCOUNTER — Encounter (HOSPITAL_COMMUNITY): Payer: Self-pay | Admitting: Emergency Medicine

## 2023-12-20 DIAGNOSIS — Z880 Allergy status to penicillin: Secondary | ICD-10-CM

## 2023-12-20 DIAGNOSIS — D62 Acute posthemorrhagic anemia: Secondary | ICD-10-CM | POA: Diagnosis present

## 2023-12-20 DIAGNOSIS — Z955 Presence of coronary angioplasty implant and graft: Secondary | ICD-10-CM | POA: Diagnosis not present

## 2023-12-20 DIAGNOSIS — M25552 Pain in left hip: Secondary | ICD-10-CM | POA: Diagnosis present

## 2023-12-20 DIAGNOSIS — M25512 Pain in left shoulder: Secondary | ICD-10-CM | POA: Diagnosis present

## 2023-12-20 DIAGNOSIS — Z79890 Hormone replacement therapy (postmenopausal): Secondary | ICD-10-CM

## 2023-12-20 DIAGNOSIS — I480 Paroxysmal atrial fibrillation: Secondary | ICD-10-CM | POA: Diagnosis present

## 2023-12-20 DIAGNOSIS — Z66 Do not resuscitate: Secondary | ICD-10-CM | POA: Diagnosis present

## 2023-12-20 DIAGNOSIS — N1832 Chronic kidney disease, stage 3b: Secondary | ICD-10-CM | POA: Diagnosis present

## 2023-12-20 DIAGNOSIS — E890 Postprocedural hypothyroidism: Secondary | ICD-10-CM | POA: Diagnosis present

## 2023-12-20 DIAGNOSIS — Z882 Allergy status to sulfonamides status: Secondary | ICD-10-CM | POA: Diagnosis not present

## 2023-12-20 DIAGNOSIS — Z96641 Presence of right artificial hip joint: Secondary | ICD-10-CM | POA: Diagnosis present

## 2023-12-20 DIAGNOSIS — Z888 Allergy status to other drugs, medicaments and biological substances status: Secondary | ICD-10-CM

## 2023-12-20 DIAGNOSIS — D631 Anemia in chronic kidney disease: Secondary | ICD-10-CM | POA: Diagnosis present

## 2023-12-20 DIAGNOSIS — I130 Hypertensive heart and chronic kidney disease with heart failure and stage 1 through stage 4 chronic kidney disease, or unspecified chronic kidney disease: Secondary | ICD-10-CM | POA: Diagnosis present

## 2023-12-20 DIAGNOSIS — I2510 Atherosclerotic heart disease of native coronary artery without angina pectoris: Secondary | ICD-10-CM | POA: Diagnosis present

## 2023-12-20 DIAGNOSIS — W1811XA Fall from or off toilet without subsequent striking against object, initial encounter: Secondary | ICD-10-CM | POA: Diagnosis present

## 2023-12-20 DIAGNOSIS — Z87891 Personal history of nicotine dependence: Secondary | ICD-10-CM

## 2023-12-20 DIAGNOSIS — E785 Hyperlipidemia, unspecified: Secondary | ICD-10-CM | POA: Diagnosis present

## 2023-12-20 DIAGNOSIS — Y92009 Unspecified place in unspecified non-institutional (private) residence as the place of occurrence of the external cause: Principal | ICD-10-CM

## 2023-12-20 DIAGNOSIS — Z1152 Encounter for screening for COVID-19: Secondary | ICD-10-CM

## 2023-12-20 DIAGNOSIS — Z951 Presence of aortocoronary bypass graft: Secondary | ICD-10-CM

## 2023-12-20 DIAGNOSIS — Y92002 Bathroom of unspecified non-institutional (private) residence single-family (private) house as the place of occurrence of the external cause: Secondary | ICD-10-CM

## 2023-12-20 DIAGNOSIS — M1990 Unspecified osteoarthritis, unspecified site: Secondary | ICD-10-CM | POA: Diagnosis present

## 2023-12-20 DIAGNOSIS — G8929 Other chronic pain: Secondary | ICD-10-CM | POA: Diagnosis present

## 2023-12-20 DIAGNOSIS — E039 Hypothyroidism, unspecified: Secondary | ICD-10-CM | POA: Diagnosis not present

## 2023-12-20 DIAGNOSIS — S72142A Displaced intertrochanteric fracture of left femur, initial encounter for closed fracture: Principal | ICD-10-CM | POA: Diagnosis present

## 2023-12-20 DIAGNOSIS — Z8249 Family history of ischemic heart disease and other diseases of the circulatory system: Secondary | ICD-10-CM | POA: Diagnosis not present

## 2023-12-20 DIAGNOSIS — S72002A Fracture of unspecified part of neck of left femur, initial encounter for closed fracture: Secondary | ICD-10-CM

## 2023-12-20 DIAGNOSIS — I5022 Chronic systolic (congestive) heart failure: Secondary | ICD-10-CM | POA: Diagnosis present

## 2023-12-20 DIAGNOSIS — W19XXXA Unspecified fall, initial encounter: Principal | ICD-10-CM

## 2023-12-20 DIAGNOSIS — Z950 Presence of cardiac pacemaker: Secondary | ICD-10-CM

## 2023-12-20 DIAGNOSIS — Z79899 Other long term (current) drug therapy: Secondary | ICD-10-CM

## 2023-12-20 DIAGNOSIS — Z7901 Long term (current) use of anticoagulants: Secondary | ICD-10-CM | POA: Diagnosis not present

## 2023-12-20 DIAGNOSIS — I252 Old myocardial infarction: Secondary | ICD-10-CM

## 2023-12-20 DIAGNOSIS — I10 Essential (primary) hypertension: Secondary | ICD-10-CM | POA: Diagnosis not present

## 2023-12-20 LAB — URINALYSIS, W/ REFLEX TO CULTURE (INFECTION SUSPECTED)
Bacteria, UA: NONE SEEN
Bilirubin Urine: NEGATIVE
Glucose, UA: NEGATIVE mg/dL
Hgb urine dipstick: NEGATIVE
Ketones, ur: NEGATIVE mg/dL
Nitrite: POSITIVE — AB
Protein, ur: NEGATIVE mg/dL
Specific Gravity, Urine: 1.006 (ref 1.005–1.030)
pH: 7 (ref 5.0–8.0)

## 2023-12-20 LAB — COMPREHENSIVE METABOLIC PANEL WITH GFR
ALT: 33 U/L (ref 0–44)
AST: 50 U/L — ABNORMAL HIGH (ref 15–41)
Albumin: 3.4 g/dL — ABNORMAL LOW (ref 3.5–5.0)
Alkaline Phosphatase: 99 U/L (ref 38–126)
Anion gap: 11 (ref 5–15)
BUN: 25 mg/dL — ABNORMAL HIGH (ref 8–23)
CO2: 24 mmol/L (ref 22–32)
Calcium: 9.7 mg/dL (ref 8.9–10.3)
Chloride: 99 mmol/L (ref 98–111)
Creatinine, Ser: 1.35 mg/dL — ABNORMAL HIGH (ref 0.44–1.00)
GFR, Estimated: 36 mL/min — ABNORMAL LOW (ref >60)
Glucose, Bld: 101 mg/dL — ABNORMAL HIGH (ref 70–99)
Potassium: 4 mmol/L (ref 3.5–5.1)
Sodium: 134 mmol/L — ABNORMAL LOW (ref 135–145)
Total Bilirubin: 1.8 mg/dL — ABNORMAL HIGH (ref 0.0–1.2)
Total Protein: 6.7 g/dL (ref 6.5–8.1)

## 2023-12-20 LAB — I-STAT CG4 LACTIC ACID, ED: Lactic Acid, Venous: 1.3 mmol/L (ref 0.5–1.9)

## 2023-12-20 LAB — CBC
HCT: 34.6 % — ABNORMAL LOW (ref 36.0–46.0)
Hemoglobin: 11 g/dL — ABNORMAL LOW (ref 12.0–15.0)
MCH: 28.5 pg (ref 26.0–34.0)
MCHC: 31.8 g/dL (ref 30.0–36.0)
MCV: 89.6 fL (ref 80.0–100.0)
Platelets: 159 K/uL (ref 150–400)
RBC: 3.86 MIL/uL — ABNORMAL LOW (ref 3.87–5.11)
RDW: 14.2 % (ref 11.5–15.5)
WBC: 7.5 K/uL (ref 4.0–10.5)
nRBC: 0 % (ref 0.0–0.2)

## 2023-12-20 LAB — I-STAT CHEM 8, ED
BUN: 26 mg/dL — ABNORMAL HIGH (ref 8–23)
Calcium, Ion: 1.26 mmol/L (ref 1.15–1.40)
Chloride: 101 mmol/L (ref 98–111)
Creatinine, Ser: 1.4 mg/dL — ABNORMAL HIGH (ref 0.44–1.00)
Glucose, Bld: 99 mg/dL (ref 70–99)
HCT: 35 % — ABNORMAL LOW (ref 36.0–46.0)
Hemoglobin: 11.9 g/dL — ABNORMAL LOW (ref 12.0–15.0)
Potassium: 4.1 mmol/L (ref 3.5–5.1)
Sodium: 137 mmol/L (ref 135–145)
TCO2: 24 mmol/L (ref 22–32)

## 2023-12-20 LAB — SAMPLE TO BLOOD BANK

## 2023-12-20 LAB — PROTIME-INR
INR: 1.5 — ABNORMAL HIGH (ref 0.8–1.2)
Prothrombin Time: 18.7 s — ABNORMAL HIGH (ref 11.4–15.2)

## 2023-12-20 MED ORDER — HYDROMORPHONE HCL 1 MG/ML IJ SOLN: 0.5000 mg | INTRAMUSCULAR | Status: DC | PRN

## 2023-12-20 MED ORDER — METHOCARBAMOL 500 MG PO TABS: 500.0000 mg | ORAL_TABLET | Freq: Three times a day (TID) | ORAL | Status: DC | PRN

## 2023-12-20 MED ORDER — ACETAMINOPHEN 500 MG PO TABS
1000.0000 mg | ORAL_TABLET | Freq: Four times a day (QID) | ORAL | Status: DC | PRN
  Administered 2023-12-21: 1000 mg via ORAL
  Filled 2023-12-20: qty 2

## 2023-12-20 MED ORDER — FENTANYL CITRATE (PF) 50 MCG/ML IJ SOSY
25.0000 ug | PREFILLED_SYRINGE | Freq: Once | INTRAMUSCULAR | Status: AC
  Administered 2023-12-20: 25 ug via INTRAVENOUS
  Filled 2023-12-20: qty 1

## 2023-12-20 MED ORDER — OXYCODONE HCL 5 MG PO TABS
2.5000 mg | ORAL_TABLET | ORAL | Status: DC | PRN
  Filled 2023-12-20: qty 1

## 2023-12-20 MED ORDER — OXYCODONE HCL 5 MG PO TABS
5.0000 mg | ORAL_TABLET | ORAL | Status: DC | PRN
  Administered 2023-12-21: 5 mg via ORAL
  Filled 2023-12-20 (×2): qty 1

## 2023-12-20 NOTE — ED Triage Notes (Signed)
 BIB [facility] from home where she lives alone. Pt had a fall from toilet, pt fell forward and landed on lt hip/knee. Pt denies LOC after she hit her head and states that she can recall the event. EMS reports shortening of left hip but denies rotation. Pt does take eliquis . En route pt received 125 mcg fentanyl .   BP 160/60 HR 75 Spo2 94% RA 24 g RFA

## 2023-12-20 NOTE — ED Provider Notes (Signed)
 [place] EMERGENCY DEPARTMENT AT [facility] Provider Note   CSN: [id number] Arrival date & time: 12/20/23  2012     History  Chief Complaint  Patient presents with   Fall on Blood Thinners    Stephanie Atkinson is a 89 y.o. female with PMH as listed below who presents as a level 2 fall on thinners.  She presents with with her niece who provides additional history.  She was brought in by EMS where she lives alone at home.  She had a fall earlier today and landed on her left hip and knee.  Patient endorses a mild head trauma but denies any loss of consciousness and states that she can recall the entire event.  She immediately began yelling for help and her neighbors came over.  She does take a blood thinner.  She had recently been diagnosed with a UTI and was taking nitrofurantoin . She denies neck or back pain, only pain in the left hip that is 0/10 without movement but 10/10 with any movement. Denies numbness/tingling. Received 125 mcg IV fentanyl  en route w/ EMS.   Past Medical History:  Diagnosis Date   Chronic kidney disease, stage 3 (HCC)    Coronary artery disease    History of UTI    Hx of CABG 2006   LIMA-LAD, free RIMA-PDA, Radial-OM1-OM2   Hyperlipidemia    Hypertension    Hypothyroid    MI, old        PAF (paroxysmal atrial fibrillation) (HCC)    was on amio, d/c'd due to prolonged PR interval, rate control   Presence of permanent cardiac pacemaker 07/16/2017   Presence of stent in right coronary artery 08/2010   RIMA-PDA occluded, DES stent placed   Syncope 08/2010   bradycardic, beta blocker decreased, also felt to be dehydrated       Home Medications Prior to Admission medications   Medication Sig Start Date End Date Taking? Authorizing Provider  acetaminophen  (TYLENOL ) 500 MG tablet Take 500 mg by mouth See admin instructions. Take 500 mg in am, 1000 mg at 2 pm, 1000 mg 6 pm and 500 mg in pm by mouth.    [provider]  apixaban  (ELIQUIS )  2.5 MG TABS tablet Take 1 tablet (2.5 mg total) by mouth 2 (two) times daily. 04/24/23   Leotis Bogus, MD  atorvastatin  (LIPITOR ) 80 MG tablet Take 80 mg by mouth at bedtime.     [provider]  Diclofenac  Sodium (VOLTAREN  EX) Apply 1 Dose topically daily as needed (Pain).    [provider]  fluticasone  (FLONASE ) 50 MCG/ACT nasal spray Place 2 sprays into both nostrils daily. 04/25/23 05/25/23  Leotis Bogus, MD  levothyroxine  (SYNTHROID , LEVOTHROID) 50 MCG tablet Take 50 mcg by mouth daily before breakfast.     [provider]  lidocaine  (LIDODERM ) 5 % Place 1 patch onto the skin daily as needed (Pain). 02/25/23   [provider]  loratadine  (CLARITIN ) 10 MG tablet Take 1 tablet (10 mg total) by mouth daily. 04/25/23 05/25/23  Leotis Bogus, MD  losartan  (COZAAR ) 25 MG tablet Take 1 tablet (25 mg total) by mouth daily. 04/25/23 05/25/23  Leotis Bogus, MD  metoprolol  succinate (TOPROL -XL) 25 MG 24 hr tablet Take 0.5 tablets (12.5 mg total) by mouth daily. 04/25/23 06/24/23  Leotis Bogus, MD  Polyethyl Glycol-Propyl Glycol (SYSTANE OP) Place 1 drop into both eyes daily as needed (dry eye).    [provider]  torsemide  (DEMADEX ) 20 MG tablet  Take 1 tablet (20 mg total) by mouth daily. 04/24/23 05/24/23  Leotis Bogus, MD      Allergies    Penicillins, Sulfa antibiotics, Gabapentin , and Lyrica [pregabalin]    Review of Systems   Review of Systems A 10 point review of systems was performed and is negative unless otherwise reported in HPI.  Physical Exam Updated Vital Signs BP (!) 156/68 (BP Location: Left Arm)   Pulse 65   Temp 99.2 F (37.3 C) (Oral)   Resp 19   Ht 5' 2 (1.575 m)   Wt 57.6 kg   SpO2 94%   BMI 23.23 kg/m  Physical Exam General: Normal appearing elderly female, lying in bed.  HEENT: NCAT, EOMI, PERRLA, Sclera anicteric, dry mucous membranes, trachea midline.  No head or facial trauma noted. Cardiology: RRR, no  murmurs/rubs/gallops.  No chest wall tenderness to palpation or crepitus noted. Resp: Normal respiratory rate and effort. CTAB, no wheezes, rhonchi, crackles.  Abd: Soft, non-tender, non-distended. No rebound tenderness or guarding.  GU: Deferred. MSK: Shortened left lower extremity with intact distal pulses and sensation.  Severe tenderness to palpation on the left hip.  Pelvis stable nontender.  Soft compartments.  No other deformities noted of any extremities.   Skin: warm, dry.  Back: No midline CT or L-spine tenderness to palpation deformities or step-offs Neuro: A&Ox4, CNs II-XII grossly intact. MAEs. Sensation grossly intact.  Psych: Normal mood and affect.   ED Results / Procedures / Treatments   Labs (all labs ordered are listed, but only abnormal results are displayed) Labs Reviewed  COMPREHENSIVE METABOLIC PANEL WITH GFR - Abnormal; Notable for the following components:      Result Value   Sodium 134 (*)    Glucose, Bld 101 (*)    BUN 25 (*)    Creatinine, Ser 1.35 (*)    Albumin 3.4 (*)    AST 50 (*)    Total Bilirubin 1.8 (*)    GFR, Estimated 36 (*)    All other components within normal limits  CBC - Abnormal; Notable for the following components:   RBC 3.86 (*)    Hemoglobin 11.0 (*)    HCT 34.6 (*)    All other components within normal limits  PROTIME-INR - Abnormal; Notable for the following components:   Prothrombin Time 18.7 (*)    INR 1.5 (*)    All other components within normal limits  I-STAT CHEM 8, ED - Abnormal; Notable for the following components:   BUN 26 (*)    Creatinine, Ser 1.40 (*)    Hemoglobin 11.9 (*)    HCT 35.0 (*)    All other components within normal limits  RESP PANEL BY RT-PCR (RSV, FLU A&B, COVID)  RVPGX2  URINALYSIS, W/ REFLEX TO CULTURE (INFECTION SUSPECTED)  I-STAT CG4 LACTIC ACID, ED  SAMPLE TO BLOOD BANK    EKG None  Radiology CT Cervical Spine Wo Contrast Result Date: 12/20/2023 EXAM: CT CERVICAL SPINE WITHOUT  CONTRAST 12/20/2023 09:28:00 PM TECHNIQUE: CT of the cervical spine was performed without the administration of intravenous contrast. Multiplanar reformatted images are provided for review. Automated exposure control, iterative reconstruction, and/or weight based adjustment of the mA/kV was utilized to reduce the radiation dose to as low as reasonably achievable. COMPARISON: None available. CLINICAL HISTORY: Neck trauma (Age >= 65y). Chief complaints; Fall on Blood Thinners; CT Cervical Spine Wo Contrast; Neck trauma (Age >= 65y) FINDINGS: BONES AND ALIGNMENT: No acute fracture or traumatic malalignment. DEGENERATIVE CHANGES: No  significant degenerative changes. SOFT TISSUES: No prevertebral soft tissue swelling. IMPRESSION: 1. No acute abnormality of the cervical spine. Electronically signed by: Pinkie Pebbles MD 12/20/2023 09:36 PM EDT RP Workstation: [id number]   CT Head Wo Contrast Result Date: 12/20/2023 EXAM: CT HEAD WITHOUT CONTRAST 12/20/2023 09:11:00 PM TECHNIQUE: CT of the head was performed without the administration of intravenous contrast. Automated exposure control, iterative reconstruction, and/or weight based adjustment of the mA/kV was utilized to reduce the radiation dose to as low as reasonably achievable. COMPARISON: 04/08/2023 CLINICAL HISTORY: Head trauma, minor (Age >= 65y). Chief complaints; Fall on Blood Thinners; CT Head Wo Contrast; Head trauma, Minor (Age >= 65y). FINDINGS: BRAIN AND VENTRICLES: No acute hemorrhage. No evidence of acute infarct. Small remote lacunar infarct in left thalamus. Periventricular and subcortical white matter hypoattenuation, likely sequela of chronic small vessel ischemic disease. Mild parenchymal volume loss. Remote infarct in right cerebellar hemisphere. No hydrocephalus. No extra-axial collection. No mass effect or midline shift. Intracranial atherosclerosis. ORBITS: Bilateral lens implants. SINUSES: No acute abnormality. SOFT TISSUES AND SKULL: No  acute soft tissue abnormality. No skull fracture. IMPRESSION: 1. No acute intracranial abnormality. Electronically signed by: Pinkie Pebbles MD 12/20/2023 09:34 PM EDT RP Workstation: [id number]   DG Pelvis Portable Result Date: 12/20/2023 EXAM: 1 Or 2 View(S) Xray Of The Pelvis 12/20/2023 08:52:44 Pm COMPARISON: None Available. CLINICAL HISTORY: Trauma. Pt Had A Fall From Toilet, Pt [employer] And Landed On Lt Hip/Knee FINDINGS: BONES AND JOINTS: Acute Left Hip Intertrochanteric Fracture And Avulsion Of The Lesser Trochanter. Right Total Hip Arthroplasty. Moderate-To-Severe Degenerative Changes Of The Left Hip. SOFT TISSUES: Surgical Clips In Left Groin Noted. Vascular Calcifications. IMPRESSION: 1. Acute left hip intertrochanteric fracture and avulsion of the lesser trochanter. 2. Right total hip arthroplasty. 3. Moderate-to-severe degenerative changes of the left hip. Electronically signed by: Dorethia Molt MD 12/20/2023 09:16 PM EDT RP Workstation: [id number]   DG Knee Left Port Result Date: 12/20/2023 EXAM: 2 VIEW(S) XRAY OF THE  Knee 12/20/2023 08:52:44 PM COMPARISON: None available. CLINICAL HISTORY: Blunt Trauma. Pt had a fall from toilet, pt fell forward and landed on lt hip/knee FINDINGS: BONES AND JOINTS: Mild tricompartmental degenerative changes. No acute fracture. No focal osseous lesion. No joint dislocation. SOFT TISSUES: Vascular calcifications. The soft tissues are unremarkable. IMPRESSION: 1. No acute fracture or dislocation. 2. Mild tricompartmental degenerative changes. Electronically signed by: Dorethia Molt MD 12/20/2023 09:15 PM EDT RP Workstation: [id number]   DG Chest Port 1 View Result Date: 12/20/2023 EXAM: 1 VIEW XRAY OF THE CHEST 12/20/2023 08:54:45 PM COMPARISON: 04/21/2023 CLINICAL HISTORY: Trauma. Pt had a fall from toilet, pt fell forward and landed on lt hip/knee FINDINGS: LUNGS AND PLEURA: Trace interstitial pulmonary edema suspected. No pneumothorax. No pleural  effusion. HEART AND MEDIASTINUM: Left pacemaker stable in place. Cardiomegaly, unchanged. Status post coronary artery bypass grafting. BONES AND SOFT TISSUES: Atherosclerotic aortic calcifications. Osseous structures are age appropriate. IMPRESSION: 1. No acute process. 2. Cardiomegaly, unchanged. 3. Trace interstitial pulmonary edema suspected. No pneumothorax or pleural effusion. Electronically signed by: Dorethia Molt MD 12/20/2023 09:14 PM EDT RP Workstation: [id number]   DG FEMUR PORT 1V LEFT Result Date: 12/20/2023 EXAM: 2 Views Xray Of The Left Femur 12/20/2023 08:51:49 Pm COMPARISON: None Available. CLINICAL HISTORY: Blunt Trauma. Pt Had A Fall From Toilet, Pt [employer] And Landed On Lt Hip/Knee FINDINGS: BONES AND JOINTS: Acute Intertrochanteric Fracture. Avulsion Of The Lesser Trochanter. Moderate Degenerative Changes Of Left Hip. Mild Degenerative Changes Of Knee. SOFT TISSUES: Surgical Clips  Overlying Left Hip. Partially Visualized Right Hip Arthroplasty. Vascular Calcifications. IMPRESSION: 1. Acute intertrochanteric fracture and avulsion of the lesser trochanter. 2. Moderate degenerative changes of left hip and mild degenerative changes of knee. Electronically signed by: Dorethia Molt MD 12/20/2023 09:11 PM EDT RP Workstation: [id number]    Procedures Procedures    Medications Ordered in ED Medications  acetaminophen  (TYLENOL ) tablet 1,000 mg (has no administration in time range)  oxyCODONE  (Oxy IR/ROXICODONE ) immediate release tablet 2.5 mg (has no administration in time range)    Or  oxyCODONE  (Oxy IR/ROXICODONE ) immediate release tablet 5 mg (has no administration in time range)  HYDROmorphone  (DILAUDID ) injection 0.5 mg (has no administration in time range)  methocarbamol  (ROBAXIN ) tablet 500 mg (has no administration in time range)  fentaNYL  (SUBLIMAZE ) injection 25 mcg (25 mcg Intravenous Given 12/20/23 2154)    ED Course/ Medical Decision Making/ A&P                           Medical Decision Making Amount and/or Complexity of Data Reviewed Labs: ordered. Decision-making details documented in ED Course. Radiology: ordered. Decision-making details documented in ED Course.  Risk Prescription drug management. Decision regarding hospitalization.    This patient presents to the ED for concern of L hip pain/fall, this involves an extensive number of treatment options, and is a complaint that carries with it a high risk of complications and morbidity.  I considered the following differential and admission for this acute, potentially life threatening condition.   MDM:    DDX for trauma includes but is not limited to:  -Head Injury such as skull fx or ICH - CTH neg Vertebral injury - CT c-spine neg -Fractures  For patient's hip pain and fall, consider hip dislocation vs fracture or femur fracture. Neurovascularly intact at this time with good distal pulses. Will obtain XRs to evaluate.  Fall was mechanical and patient reported no chest pain, lightheadedness, palpitations or shortness of breath prior to the fall.  Patient has no pain elsewhere. No signs of compartment syndrome or open wound.  She was also recently treated for UTI with nitrofurantoin  her family notes through the phone that she had improved actually, will recheck her urine today.  She has a low-grade fever 99.2 F but no leukocytosis, tachycardia, tachypnea or other signs of sepsis.   Clinical Course as of 12/20/23 2314  Sat Dec 20, 2023  2136 INR(!): 1.5 [HN]  2136 Lactic Acid, Venous: 1.3 [HN]  2136 WBC: 7.5 No leukocytosis [HN]  2136 Hemoglobin(!): 11.0 Near her baseline [HN]  2136 CT Head Wo Contrast 1. No acute intracranial abnormality. [HN]  2136 DG Pelvis Portable 1. Acute left hip intertrochanteric fracture and avulsion of the lesser trochanter. 2. Right total hip arthroplasty. 3. Moderate-to-severe degenerative changes of the left hip.   [HN]  2136 DG Knee Left Port 1. No  acute fracture or dislocation. 2. Mild tricompartmental degenerative changes.   [HN]  2136 DG Knee Left Port 1. No acute process. 2. Cardiomegaly, unchanged. 3. Trace interstitial pulmonary edema suspected. No pneumothorax or pleural effusion.   [HN]  2137 Consulted orthopedic surgery. [HN]  2156 CT Cervical Spine Wo Contrast 1. No acute abnormality of the cervical spine. [HN]  2156 Discussed with Dr. Sharl who recommends admission to the hospitalist, n.p.o. at midnight, and will try to get her repaired tomorrow if not Monday. [HN]    Clinical Course User Index [HN] Franklyn Sid SAILOR, MD  Labs: I Ordered, and personally interpreted labs.  The pertinent results include: Those listed above  Imaging Studies ordered: I ordered imaging studies including CT head, CT C-spine, chest x-ray, left hip x-ray I independently visualized and interpreted imaging. I agree with the radiologist interpretation  Additional history obtained from chart review, family at bedside and over the phone, EMS.    Cardiac Monitoring: The patient was maintained on a cardiac monitor.  I personally viewed and interpreted the cardiac monitored which showed an underlying rhythm of: Normal sinus rhythm  Reevaluation: After the interventions noted above, I reevaluated the patient and found that they have :improved  Social Determinants of Health: Lives independently  Disposition:  admit to medicine w/ ortho following  Co morbidities that complicate the patient evaluation  Past Medical History:  Diagnosis Date   Chronic kidney disease, stage 3 (HCC)    Coronary artery disease    History of UTI    Hx of CABG 2006   LIMA-LAD, free RIMA-PDA, Radial-OM1-OM2   Hyperlipidemia    Hypertension    Hypothyroid    MI, old        PAF (paroxysmal atrial fibrillation) (HCC)    was on amio, d/c'd due to prolonged PR interval, rate control   Presence of permanent cardiac pacemaker 07/16/2017   Presence of stent in  right coronary artery 08/2010   RIMA-PDA occluded, DES stent placed   Syncope 08/2010   bradycardic, beta blocker decreased, also felt to be dehydrated     Medicines Meds ordered this encounter  Medications   fentaNYL  (SUBLIMAZE ) injection 25 mcg   acetaminophen  (TYLENOL ) tablet 1,000 mg   OR Linked Order Group    oxyCODONE  (Oxy IR/ROXICODONE ) immediate release tablet 2.5 mg     Refill:  0    oxyCODONE  (Oxy IR/ROXICODONE ) immediate release tablet 5 mg     Refill:  0   HYDROmorphone  (DILAUDID ) injection 0.5 mg   methocarbamol  (ROBAXIN ) tablet 500 mg    I have reviewed the patients home medicines and have made adjustments as needed  Problem List / ED Course: Problem List Items Addressed This Visit   None Visit Diagnoses       Fall in home, initial encounter    -  Primary     Closed left hip fracture, initial encounter [facility])                       This note was created using dictation software, which may contain spelling or grammatical errors.    Franklyn Sid SAILOR, MD 12/20/23 [phone]

## 2023-12-20 NOTE — ED Notes (Signed)
C-collar removed per Dr. Jearld Fenton

## 2023-12-20 NOTE — ED Notes (Signed)
 POA/Daughter [name] Room [phone] would like an update immediately

## 2023-12-20 NOTE — Progress Notes (Signed)
 Xrays reviewed and case dw EDP.  Pt on DOAC, and took today's dose.  Will benefit from IMN for left hip fx.  Pnding OR availability and allowing anticoagulant to washout, most likely surgery would be Monday.  Consult note to come tomorrow.

## 2023-12-20 NOTE — Progress Notes (Signed)
 Orthopedic Tech Progress Note Patient Details:  Stephanie Atkinson [birth year] [id number]  Patient ID: Stephanie Atkinson Cart, female   DOB: Apr 23, 1924, 89 y.o.   MRN: [id number] I attended trauma page. Chandra Dorn PARAS 12/20/2023, 8:35 PM

## 2023-12-20 NOTE — Progress Notes (Signed)
 Chaplain responds to level 2 trauma and provides compassionate presence as medical team cares for pt. No family or loved ones present.

## 2023-12-21 ENCOUNTER — Encounter (HOSPITAL_COMMUNITY): Payer: Self-pay | Admitting: Internal Medicine

## 2023-12-21 ENCOUNTER — Inpatient Hospital Stay (HOSPITAL_COMMUNITY)

## 2023-12-21 ENCOUNTER — Inpatient Hospital Stay (HOSPITAL_COMMUNITY): Admitting: Anesthesiology

## 2023-12-21 ENCOUNTER — Encounter (HOSPITAL_COMMUNITY)
Admission: EM | Disposition: A | Discharge status: Skilled Nursing Facility | Payer: Self-pay | Source: Home / Self Care | Attending: Internal Medicine

## 2023-12-21 DIAGNOSIS — S72142A Displaced intertrochanteric fracture of left femur, initial encounter for closed fracture: Secondary | ICD-10-CM | POA: Diagnosis not present

## 2023-12-21 DIAGNOSIS — I10 Essential (primary) hypertension: Secondary | ICD-10-CM

## 2023-12-21 DIAGNOSIS — S72002A Fracture of unspecified part of neck of left femur, initial encounter for closed fracture: Secondary | ICD-10-CM

## 2023-12-21 DIAGNOSIS — E039 Hypothyroidism, unspecified: Secondary | ICD-10-CM

## 2023-12-21 DIAGNOSIS — I2510 Atherosclerotic heart disease of native coronary artery without angina pectoris: Secondary | ICD-10-CM

## 2023-12-21 HISTORY — PX: INTRAMEDULLARY (IM) NAIL INTERTROCHANTERIC: SHX5875

## 2023-12-21 LAB — BASIC METABOLIC PANEL WITH GFR
Anion gap: 10 (ref 5–15)
BUN: 24 mg/dL — ABNORMAL HIGH (ref 8–23)
CO2: 25 mmol/L (ref 22–32)
Calcium: 9.8 mg/dL (ref 8.9–10.3)
Chloride: 103 mmol/L (ref 98–111)
Creatinine, Ser: 1.18 mg/dL — ABNORMAL HIGH (ref 0.44–1.00)
GFR, Estimated: 42 mL/min — ABNORMAL LOW (ref >60)
Glucose, Bld: 100 mg/dL — ABNORMAL HIGH (ref 70–99)
Potassium: 3.9 mmol/L (ref 3.5–5.1)
Sodium: 138 mmol/L (ref 135–145)

## 2023-12-21 LAB — RESP PANEL BY RT-PCR (RSV, FLU A&B, COVID)  RVPGX2
Influenza A by PCR: NEGATIVE
Influenza B by PCR: NEGATIVE
Resp Syncytial Virus by PCR: NEGATIVE
SARS Coronavirus 2 by RT PCR: NEGATIVE

## 2023-12-21 LAB — CBC
HCT: 30.5 % — ABNORMAL LOW (ref 36.0–46.0)
Hemoglobin: 9.7 g/dL — ABNORMAL LOW (ref 12.0–15.0)
MCH: 27.7 pg (ref 26.0–34.0)
MCHC: 31.8 g/dL (ref 30.0–36.0)
MCV: 87.1 fL (ref 80.0–100.0)
Platelets: 149 K/uL — ABNORMAL LOW (ref 150–400)
RBC: 3.5 MIL/uL — ABNORMAL LOW (ref 3.87–5.11)
RDW: 13.6 % (ref 11.5–15.5)
WBC: 6.5 K/uL (ref 4.0–10.5)
nRBC: 0 % (ref 0.0–0.2)

## 2023-12-21 LAB — TYPE AND SCREEN
ABO/RH(D): B POS
Antibody Screen: NEGATIVE

## 2023-12-21 LAB — MAGNESIUM: Magnesium: 2.2 mg/dL (ref 1.7–2.4)

## 2023-12-21 LAB — PHOSPHORUS: Phosphorus: 3 mg/dL (ref 2.5–4.6)

## 2023-12-21 SURGERY — FIXATION, FRACTURE, INTERTROCHANTERIC, WITH INTRAMEDULLARY ROD
Anesthesia: General | Laterality: Left

## 2023-12-21 MED ORDER — ONDANSETRON HCL 4 MG/2ML IJ SOLN
INTRAMUSCULAR | Status: DC | PRN
  Administered 2023-12-21: 4 mg via INTRAVENOUS

## 2023-12-21 MED ORDER — LACTATED RINGERS IV SOLN: INTRAVENOUS | Status: DC | PRN

## 2023-12-21 MED ORDER — LEVOTHYROXINE SODIUM 50 MCG PO TABS
50.0000 ug | ORAL_TABLET | Freq: Every day | ORAL | Status: DC
  Administered 2023-12-21 – 2023-12-24 (×4): 50 ug via ORAL
  Filled 2023-12-21: qty 1
  Filled 2023-12-21: qty 2
  Filled 2023-12-21 (×2): qty 1

## 2023-12-21 MED ORDER — METHOCARBAMOL 1000 MG/10ML IJ SOLN: 500.0000 mg | Freq: Four times a day (QID) | INTRAMUSCULAR | Status: DC | PRN

## 2023-12-21 MED ORDER — METOCLOPRAMIDE HCL 5 MG/ML IJ SOLN: 5.0000 mg | Freq: Three times a day (TID) | INTRAMUSCULAR | Status: DC | PRN

## 2023-12-21 MED ORDER — ACETAMINOPHEN 10 MG/ML IV SOLN
INTRAVENOUS | Status: AC
  Filled 2023-12-21: qty 100

## 2023-12-21 MED ORDER — METOCLOPRAMIDE HCL 5 MG PO TABS: 5.0000 mg | ORAL_TABLET | Freq: Three times a day (TID) | ORAL | Status: DC | PRN

## 2023-12-21 MED ORDER — CHLORHEXIDINE GLUCONATE 0.12 % MT SOLN
15.0000 mL | Freq: Once | OROMUCOSAL | Status: AC
  Administered 2023-12-21: 15 mL via OROMUCOSAL

## 2023-12-21 MED ORDER — FENTANYL CITRATE (PF) 100 MCG/2ML IJ SOLN
25.0000 ug | INTRAMUSCULAR | Status: DC | PRN
  Administered 2023-12-21 (×3): 50 ug via INTRAVENOUS

## 2023-12-21 MED ORDER — PROPOFOL 10 MG/ML IV BOLUS
INTRAVENOUS | Status: AC
Start: 2023-12-21 — End: 2023-12-21
  Filled 2023-12-21: qty 20

## 2023-12-21 MED ORDER — POLYETHYLENE GLYCOL 3350 17 G PO PACK
17.0000 g | PACK | Freq: Two times a day (BID) | ORAL | Status: DC
  Administered 2023-12-21 – 2023-12-24 (×6): 17 g via ORAL
  Filled 2023-12-21 (×6): qty 1

## 2023-12-21 MED ORDER — LACTATED RINGERS IV SOLN
INTRAVENOUS | Status: DC
Start: 2023-12-21 — End: 2023-12-21

## 2023-12-21 MED ORDER — CEFAZOLIN SODIUM-DEXTROSE 2-4 GM/100ML-% IV SOLN
INTRAVENOUS | Status: AC
  Filled 2023-12-21: qty 100

## 2023-12-21 MED ORDER — SUGAMMADEX SODIUM 200 MG/2ML IV SOLN
INTRAVENOUS | Status: DC | PRN
  Administered 2023-12-21: 150 mg via INTRAVENOUS

## 2023-12-21 MED ORDER — DEXAMETHASONE SODIUM PHOSPHATE 4 MG/ML IJ SOLN
INTRAMUSCULAR | Status: DC | PRN
  Administered 2023-12-21: 5 mg via INTRAVENOUS

## 2023-12-21 MED ORDER — DIPHENHYDRAMINE HCL 12.5 MG/5ML PO ELIX: 12.5000 mg | ORAL_SOLUTION | ORAL | Status: DC | PRN

## 2023-12-21 MED ORDER — ORAL CARE MOUTH RINSE: 15.0000 mL | Freq: Once | OROMUCOSAL | Status: AC

## 2023-12-21 MED ORDER — ONDANSETRON HCL 4 MG PO TABS: 4.0000 mg | ORAL_TABLET | Freq: Four times a day (QID) | ORAL | Status: DC | PRN

## 2023-12-21 MED ORDER — PHENOL 1.4 % MT LIQD
1.0000 | OROMUCOSAL | Status: DC | PRN
Start: 2023-12-21 — End: 2023-12-24

## 2023-12-21 MED ORDER — PROPOFOL 10 MG/ML IV BOLUS
INTRAVENOUS | Status: DC | PRN
  Administered 2023-12-21: 60 mg via INTRAVENOUS
  Administered 2023-12-21: 20 mg via INTRAVENOUS

## 2023-12-21 MED ORDER — MENTHOL 3 MG MT LOZG: 1.0000 | LOZENGE | OROMUCOSAL | Status: DC | PRN

## 2023-12-21 MED ORDER — ACETAMINOPHEN 10 MG/ML IV SOLN
INTRAVENOUS | Status: DC | PRN
Start: 2023-12-21 — End: 2023-12-21
  Administered 2023-12-21: 800 mg via INTRAVENOUS

## 2023-12-21 MED ORDER — CEFAZOLIN SODIUM-DEXTROSE 2-4 GM/100ML-% IV SOLN
2.0000 g | INTRAVENOUS | Status: AC
  Administered 2023-12-21: 2 g via INTRAVENOUS

## 2023-12-21 MED ORDER — METOPROLOL SUCCINATE ER 25 MG PO TB24
12.5000 mg | ORAL_TABLET | Freq: Every day | ORAL | Status: DC
  Administered 2023-12-22 – 2023-12-24 (×3): 12.5 mg via ORAL
  Filled 2023-12-21 (×4): qty 1

## 2023-12-21 MED ORDER — LIDOCAINE HCL (CARDIAC) PF 100 MG/5ML IV SOSY
PREFILLED_SYRINGE | INTRAVENOUS | Status: DC | PRN
  Administered 2023-12-21: 50 mg via INTRATRACHEAL

## 2023-12-21 MED ORDER — FENTANYL CITRATE (PF) 100 MCG/2ML IJ SOLN
INTRAMUSCULAR | Status: AC
  Filled 2023-12-21: qty 2

## 2023-12-21 MED ORDER — CHLORHEXIDINE GLUCONATE 0.12 % MT SOLN
OROMUCOSAL | Status: AC
  Filled 2023-12-21: qty 15

## 2023-12-21 MED ORDER — ALUM & MAG HYDROXIDE-SIMETH 200-200-20 MG/5ML PO SUSP: 30.0000 mL | ORAL | Status: DC | PRN

## 2023-12-21 MED ORDER — TRANEXAMIC ACID-NACL 1000-0.7 MG/100ML-% IV SOLN
1000.0000 mg | Freq: Once | INTRAVENOUS | Status: AC
  Administered 2023-12-21: 1000 mg via INTRAVENOUS
  Filled 2023-12-21: qty 100

## 2023-12-21 MED ORDER — METHOCARBAMOL 500 MG PO TABS
500.0000 mg | ORAL_TABLET | Freq: Four times a day (QID) | ORAL | Status: DC | PRN
  Administered 2023-12-21 – 2023-12-24 (×5): 500 mg via ORAL
  Filled 2023-12-21 (×5): qty 1

## 2023-12-21 MED ORDER — FENTANYL CITRATE (PF) 250 MCG/5ML IJ SOLN
INTRAMUSCULAR | Status: AC
  Filled 2023-12-21: qty 5

## 2023-12-21 MED ORDER — ACETAMINOPHEN 500 MG PO TABS
1000.0000 mg | ORAL_TABLET | Freq: Three times a day (TID) | ORAL | Status: AC
  Administered 2023-12-22 (×2): 1000 mg via ORAL
  Filled 2023-12-21 (×3): qty 2

## 2023-12-21 MED ORDER — TORSEMIDE 20 MG PO TABS
20.0000 mg | ORAL_TABLET | Freq: Every day | ORAL | Status: DC
  Filled 2023-12-21: qty 1

## 2023-12-21 MED ORDER — ROCURONIUM BROMIDE 100 MG/10ML IV SOLN
INTRAVENOUS | Status: DC | PRN
  Administered 2023-12-21: 40 mg via INTRAVENOUS

## 2023-12-21 MED ORDER — DEXAMETHASONE SOD PHOSPHATE PF 10 MG/ML IJ SOLN: 8.0000 mg | Freq: Once | INTRAMUSCULAR | Status: DC

## 2023-12-21 MED ORDER — SENNA 8.6 MG PO TABS
2.0000 | ORAL_TABLET | Freq: Every day | ORAL | Status: DC
  Administered 2023-12-21 – 2023-12-23 (×3): 17.2 mg via ORAL
  Filled 2023-12-21 (×3): qty 2

## 2023-12-21 MED ORDER — OXYCODONE HCL 5 MG PO TABS
10.0000 mg | ORAL_TABLET | ORAL | Status: DC | PRN
  Administered 2023-12-21 – 2023-12-22 (×3): 10 mg via ORAL
  Filled 2023-12-21 (×3): qty 2

## 2023-12-21 MED ORDER — FENTANYL CITRATE (PF) 100 MCG/2ML IJ SOLN
INTRAMUSCULAR | Status: DC | PRN
  Administered 2023-12-21: 25 ug via INTRAVENOUS
  Administered 2023-12-21: 50 ug via INTRAVENOUS
  Administered 2023-12-21 (×2): 25 ug via INTRAVENOUS

## 2023-12-21 MED ORDER — CEFAZOLIN SODIUM-DEXTROSE 2-4 GM/100ML-% IV SOLN
2.0000 g | Freq: Four times a day (QID) | INTRAVENOUS | Status: AC
  Administered 2023-12-21 – 2023-12-22 (×2): 2 g via INTRAVENOUS
  Filled 2023-12-21 (×2): qty 100

## 2023-12-21 MED ORDER — DEXAMETHASONE SOD PHOSPHATE PF 10 MG/ML IJ SOLN
10.0000 mg | Freq: Once | INTRAMUSCULAR | Status: AC
  Administered 2023-12-22: 10 mg via INTRAVENOUS

## 2023-12-21 MED ORDER — ONDANSETRON HCL 4 MG/2ML IJ SOLN: 4.0000 mg | Freq: Four times a day (QID) | INTRAMUSCULAR | Status: DC | PRN

## 2023-12-21 MED ORDER — TRANEXAMIC ACID-NACL 1000-0.7 MG/100ML-% IV SOLN
INTRAVENOUS | Status: AC
  Filled 2023-12-21: qty 100

## 2023-12-21 MED ORDER — ASPIRIN 81 MG PO CHEW
81.0000 mg | CHEWABLE_TABLET | Freq: Two times a day (BID) | ORAL | Status: DC
  Administered 2023-12-21 – 2023-12-22 (×2): 81 mg via ORAL
  Filled 2023-12-21 (×2): qty 1

## 2023-12-21 MED ORDER — TRANEXAMIC ACID-NACL 1000-0.7 MG/100ML-% IV SOLN
1000.0000 mg | INTRAVENOUS | Status: AC
  Administered 2023-12-21: 1000 mg via INTRAVENOUS

## 2023-12-21 MED ORDER — SODIUM CHLORIDE 0.9% FLUSH
3.0000 mL | Freq: Two times a day (BID) | INTRAVENOUS | Status: DC
Start: 2023-12-21 — End: 2023-12-21

## 2023-12-21 MED ORDER — ATORVASTATIN CALCIUM 80 MG PO TABS
80.0000 mg | ORAL_TABLET | Freq: Every day | ORAL | Status: DC
  Administered 2023-12-21 – 2023-12-23 (×4): 80 mg via ORAL
  Filled 2023-12-21: qty 1
  Filled 2023-12-21: qty 2
  Filled 2023-12-21 (×2): qty 1

## 2023-12-21 MED ORDER — BISACODYL 10 MG RE SUPP: 10.0000 mg | Freq: Every day | RECTAL | Status: DC | PRN

## 2023-12-21 MED ORDER — 0.9 % SODIUM CHLORIDE (POUR BTL) OPTIME
TOPICAL | Status: DC | PRN
  Administered 2023-12-21: 1000 mL

## 2023-12-21 MED ORDER — HYDROMORPHONE HCL 1 MG/ML IJ SOLN: 0.5000 mg | INTRAMUSCULAR | Status: DC | PRN

## 2023-12-21 MED ORDER — DROPERIDOL 2.5 MG/ML IJ SOLN: 0.6250 mg | Freq: Once | INTRAMUSCULAR | Status: DC | PRN

## 2023-12-21 MED ORDER — SODIUM CHLORIDE 0.9 % IV SOLN: INTRAVENOUS | Status: DC

## 2023-12-21 MED ORDER — OXYCODONE HCL 5 MG PO TABS
5.0000 mg | ORAL_TABLET | ORAL | Status: DC | PRN
  Administered 2023-12-23 – 2023-12-24 (×4): 5 mg via ORAL
  Filled 2023-12-21 (×4): qty 1

## 2023-12-21 MED ORDER — POVIDONE-IODINE 10 % EX SWAB
2.0000 | Freq: Once | CUTANEOUS | Status: AC
  Administered 2023-12-21: 2 via TOPICAL

## 2023-12-21 MED ORDER — PHENYLEPHRINE HCL (PRESSORS) 10 MG/ML IV SOLN
INTRAVENOUS | Status: DC | PRN
  Administered 2023-12-21: 40 ug via INTRAVENOUS
  Administered 2023-12-21: 80 ug via INTRAVENOUS

## 2023-12-21 SURGICAL SUPPLY — 38 items
ALCOHOL 70% 16 OZ (MISCELLANEOUS) ×2 IMPLANT
BAG COUNTER SPONGE SURGICOUNT (BAG) ×2 IMPLANT
BIT DRILL CANN 16 HIP (BIT) IMPLANT
BIT DRILL CANN STP 6/9 HIP (BIT) IMPLANT
BIT DRILL LONG 4.2 (BIT) IMPLANT
BIT DRILL TAPERED 10 (BIT) IMPLANT
BNDG COHESIVE 6X5 TAN ST LF (GAUZE/BANDAGES/DRESSINGS) ×4 IMPLANT
CANISTER SUCTION 3000ML PPV (SUCTIONS) ×2 IMPLANT
COVER PERINEAL POST (MISCELLANEOUS) ×2 IMPLANT
COVER SURGICAL LIGHT HANDLE (MISCELLANEOUS) ×2 IMPLANT
DRAPE C-ARM 42X72 X-RAY (DRAPES) ×2 IMPLANT
DRAPE HALF SHEET 40X57 (DRAPES) IMPLANT
DRAPE INCISE IOBAN 66X45 STRL (DRAPES) ×2 IMPLANT
DRAPE STERI IOBAN 125X83 (DRAPES) ×2 IMPLANT
DRESSING MEPILEX FLEX 4X4 (GAUZE/BANDAGES/DRESSINGS) IMPLANT
DRSG ADAPTIC 3X8 NADH LF (GAUZE/BANDAGES/DRESSINGS) ×2 IMPLANT
DURAPREP 26ML APPLICATOR (WOUND CARE) ×2 IMPLANT
ELECT CAUTERY BLADE 6.4 (BLADE) ×2 IMPLANT
ELECTRODE REM PT RTRN 9FT ADLT (ELECTROSURGICAL) ×2 IMPLANT
GAUZE SPONGE 4X4 12PLY STRL LF (GAUZE/BANDAGES/DRESSINGS) ×2 IMPLANT
GLOVE BIO SURGEON STRL SZ7.5 (GLOVE) ×4 IMPLANT
GLOVE BIOGEL PI IND STRL 8 (GLOVE) ×4 IMPLANT
GOWN STRL REUS W/ TWL LRG LVL3 (GOWN DISPOSABLE) ×2 IMPLANT
GOWN STRL REUS W/ TWL XL LVL3 (GOWN DISPOSABLE) ×4 IMPLANT
GUIDEWIRE 3.2X400 (WIRE) IMPLANT
IMPL DEG TI CANN 11MM/130 (Orthopedic Implant) IMPLANT
KIT BASIN OR (CUSTOM PROCEDURE TRAY) ×2 IMPLANT
KIT TURNOVER KIT B (KITS) ×2 IMPLANT
PACK GENERAL/GYN (CUSTOM PROCEDURE TRAY) ×2 IMPLANT
PAD ARMBOARD POSITIONER FOAM (MISCELLANEOUS) ×4 IMPLANT
SCREW LOCK IM NL 5X38 STL (Screw) IMPLANT
SCREW TFNA 105MM STERILE (Screw) IMPLANT
SOLN 0.9% NACL POUR BTL 1000ML (IV SOLUTION) ×2 IMPLANT
SOLN STERILE WATER BTL 1000 ML (IV SOLUTION) ×2 IMPLANT
STAPLER SKIN PROX 35W (STAPLE) ×2 IMPLANT
SUT MON AB 2-0 CT1 36 (SUTURE) ×2 IMPLANT
TOWEL GREEN STERILE (TOWEL DISPOSABLE) ×2 IMPLANT
TOWEL GREEN STERILE FF (TOWEL DISPOSABLE) ×2 IMPLANT

## 2023-12-21 NOTE — ED Notes (Signed)
 Spoke with pt daughter, [name], who is also POA. [name] and this RN spoke about the fentanyl  being admin to her mother. [name] wanted to express her concern about the need for fentanyl  at this time. This RN spoke of the use of fentanyl  in a possible femur fx and that it is standard for that medication (or other IV pain meds) to be given in this instance. [name] was informed that fentanyl  will not be administered for pain all the time and that the pt would most likely be ordered other PO or IV medications throughout her admission, if they admit her. [name] verbalized understanding and gave consent for fentanyl  to be administered at this time.

## 2023-12-21 NOTE — Anesthesia Preprocedure Evaluation (Addendum)
 Anesthesia Evaluation  Patient identified by MRN, date of birth, ID band Patient awake    Reviewed: Allergy & Precautions, NPO status , Patient's Chart, lab work & pertinent test results  Airway Mallampati: III  TM Distance: >3 FB Neck ROM: Limited    Dental  (+) Dental Advisory Given, Teeth Intact   Pulmonary pneumonia   Pulmonary exam normal breath sounds clear to auscultation       Cardiovascular hypertension, Pt. on medications + CAD, + Past MI, + CABG and +CHF  Normal cardiovascular exam+ dysrhythmias Atrial Fibrillation + pacemaker  Rhythm:Regular Rate:Normal  Echo 12/2022 1. Abnormal (paradoxical) septal motion, consistent with RV pacemaker.  Left ventricular ejection fraction, by estimation, is 35 to 40%. The  left ventricle has moderately decreased function. The left ventricle  demonstrates global hypokinesis. There is mild left ventricular  hypertrophy. Left ventricular diastolic parameters are indeterminate.   2. Right ventricular systolic function is normal. The right ventricular  size is normal. There is normal pulmonary artery systolic pressure.   3. Left atrial size was severely dilated.   4. Right atrial size was severely dilated.   5. The mitral valve is degenerative. Trivial mitral valve regurgitation.  No evidence of mitral stenosis.   6. The aortic valve is tricuspid. Aortic valve regurgitation is trivial.  No aortic stenosis is present.   7. Pulmonic valve regurgitation is moderate.     Neuro/Psych negative neurological ROS     GI/Hepatic negative GI ROS, Neg liver ROS,,,  Endo/Other  Hypothyroidism    Renal/GU Renal disease     Musculoskeletal  (+) Arthritis ,    Abdominal   Peds  Hematology  (+) Blood dyscrasia, anemia   Anesthesia Other Findings   Reproductive/Obstetrics                              Anesthesia Physical Anesthesia Plan  ASA: 4  Anesthesia  Plan: General   Post-op Pain Management: Ofirmev  IV (intra-op)*   Induction: Intravenous  PONV Risk Score and Plan: 4 or greater and Ondansetron , Dexamethasone  and Treatment may vary due to age or medical condition  Airway Management Planned: Oral ETT  Additional Equipment: Arterial line  Intra-op Plan:   Post-operative Plan: Extubation in OR  Informed Consent: I have reviewed the patients History and Physical, chart, labs and discussed the procedure including the risks, benefits and alternatives for the proposed anesthesia with the patient or authorized representative who has indicated his/her understanding and acceptance.     Dental advisory given  Plan Discussed with: CRNA  Anesthesia Plan Comments:          Anesthesia Quick Evaluation

## 2023-12-21 NOTE — Progress Notes (Addendum)
 PROGRESS NOTE  Stephanie Atkinson FMW:[id number] DOB: 29-Mar-1924 DOA: 12/20/2023 PCP: [facility]  HPI/Recap of past 24 hours: Stephanie Atkinson is a 89 y.o. female with hx of CAD s/p CABG '06, HFrEF, Afib on AC, CKD3b, HTN, HLD, Hypothyroidism, who presented after a ground level fall. Reports she was on the toilet and felt that her right leg was becoming heavy, and when she stood up she fell onto her left side. Heard a crack in her left hip + sudden pain. Also c/o pain in the left shoulder and knee. She has no headache, or vision changes, speech changes, numbness or weakness in her extremities. Thinks RLE sensation may have been from being on the toilet too long. She lives alone in [place] and is independent will all ADLs and most IADLs, has someone come help with her laundry. She remains active and goes on walks, does chores around the house, up and down stairs without issue. No exertional chest pain. She takes eliquis  BID with last dose 10/25 in the AM.    12/21/2023: The patient was seen and examined at her bedside in the ER.  Her pain was controlled at the time of his visit.  Discussed with Dr. Teresa from orthopedic surgery, plan for surgical repair of displaced left intertrochanteric femur fracture today.  Assessment/Plan: Principal Problem:   Closed intertrochanteric fracture of hip, left, initial encounter (HCC)  Left femoral intertrochanteric fracture Ground-level fall -DVT prophylaxis to be readdressed postop; SCDs -Tele monitoring periop -PT/ OT eval postop ordered   -Pain mgmt: Tylenol  prn for mild, oxycodone  2.5/5 mg p.o. every 4 hours prn for moderate/severe, Dilaudid  0.5 mg IV every 4 hours prn for breakthrough   Preoperative risk stratification / optimization  Hx CAD, CABG. HfrEF, no severe valvular disease, Afib. She has good activity tolerance for her age. RCRI is 2, 5% risk of MACE, may underestimate her risk given advanced age and comorbidities.  - EKG preop - Do not feel  additional cardiac testing or intervention would alter management.  - Hold home torsemide  and losartan  preoperatively, continue beta-blocker.    Chronic medical problems: CAD s/p CABG: Holding DOAC, continue statin HFrEF: ? Trace interstitial edema, continue home Torsemide . Hold losartan  preop.  Continue beta-blocker per below A-fib on AC: Hold home Eliquis . Continue home Metoprolol  daily CKD 3B: Baseline creatinine near 1-1.1.  Slightly up at 1.3.  Holding diuretic.  Oral hydration, trend creatinine Hypertension: See HFrEF/A-fib.  Vital signs are stable. Hyperlipidemia: Continue home atorvastatin .   Hypothyroidism: Continue home levothyroxine . Chronic pain: Normally on tramadol , see acute pain management above.   Body mass index is 23.23 kg/m.    Time: 35 minutes.  Code Status: DNR.  Family Communication: None at bedside.  Disposition Plan: Likely will DC to SNF rehab.   Consultants: Orthopedic surgery.  Procedures: Plan for left femoral intertrochanteric fracture, surgical repair.  Antimicrobials: None.  DVT prophylaxis: SCDs.  Status is: Inpatient Inpatient status.  The patient requires at least 2 midnights for further evaluation and treatment of present condition.    Objective: Vitals:   12/21/23 0815 12/21/23 1120 12/21/23 1307 12/21/23 1413  BP: (!) 125/56 (!) 126/50 129/64   Pulse: 61 (!) 59 61   Resp: 19 18 18    Temp:   98.6 F (37 C)   TempSrc:      SpO2: 100% 100% 100%   Weight:    57.6 kg  Height:    5' 2.01 (1.575 m)   No intake or output data  in the 24 hours ending 12/21/23 1501 Filed Weights   12/20/23 2029 12/21/23 1413  Weight: 57.6 kg 57.6 kg    Exam:  General: 89 y.o. year-old female well developed well nourished in no acute distress.  Alert and oriented x3. Cardiovascular: Regular rate and rhythm with no rubs or gallops.  No thyromegaly or JVD noted.   Respiratory: Clear to auscultation with no wheezes or rales. Good inspiratory  effort. Abdomen: Soft nontender nondistended with normal bowel sounds x4 quadrants. Musculoskeletal: No lower extremity edema. 2/4 pulses in all 4 extremities. Skin: No ulcerative lesions noted or rashes, Psychiatry: Mood is appropriate for condition and setting   Data Reviewed: CBC: Recent Labs  Lab 12/20/23 2034 12/20/23 2038 12/21/23 0601  WBC 7.5  --  6.5  HGB 11.0* 11.9* 9.7*  HCT 34.6* 35.0* 30.5*  MCV 89.6  --  87.1  PLT 159  --  149*   Basic Metabolic Panel: Recent Labs  Lab 12/20/23 2034 12/20/23 2038 12/21/23 0601  NA 134* 137 138  K 4.0 4.1 3.9  CL 99 101 103  CO2 24  --  25  GLUCOSE 101* 99 100*  BUN 25* 26* 24*  CREATININE 1.35* 1.40* 1.18*  CALCIUM  9.7  --  9.8  MG  --   --  2.2  PHOS  --   --  3.0   GFR: Estimated Creatinine Clearance: 21.6 mL/min (A) (by C-G formula based on SCr of 1.18 mg/dL (H)). Liver Function Tests: Recent Labs  Lab 12/20/23 2034  AST 50*  ALT 33  ALKPHOS 99  BILITOT 1.8*  PROT 6.7  ALBUMIN 3.4*   No results for input(s): LIPASE, AMYLASE in the last 168 hours. No results for input(s): AMMONIA in the last 168 hours. Coagulation Profile: Recent Labs  Lab 12/20/23 2034  INR 1.5*   Cardiac Enzymes: No results for input(s): CKTOTAL, CKMB, CKMBINDEX, TROPONINI in the last 168 hours. BNP (last 3 results) No results for input(s): PROBNP in the last 8760 hours. HbA1C: No results for input(s): HGBA1C in the last 72 hours. CBG: No results for input(s): GLUCAP in the last 168 hours. Lipid Profile: No results for input(s): CHOL, HDL, LDLCALC, TRIG, CHOLHDL, LDLDIRECT in the last 72 hours. Thyroid  Function Tests: No results for input(s): TSH, T4TOTAL, FREET4, T3FREE, THYROIDAB in the last 72 hours. Anemia Panel: No results for input(s): VITAMINB12, FOLATE, FERRITIN, TIBC, IRON, RETICCTPCT in the last 72 hours. Urine analysis:    Component Value Date/Time   COLORURINE  AMBER (A) 12/20/2023 2239   APPEARANCEUR CLEAR 12/20/2023 2239   LABSPEC 1.006 12/20/2023 2239   PHURINE 7.0 12/20/2023 2239   GLUCOSEU NEGATIVE 12/20/2023 2239   HGBUR NEGATIVE 12/20/2023 2239   BILIRUBINUR NEGATIVE 12/20/2023 2239   KETONESUR NEGATIVE 12/20/2023 2239   PROTEINUR NEGATIVE 12/20/2023 2239   UROBILINOGEN 1.0 10/14/2012 0922   NITRITE POSITIVE (A) 12/20/2023 2239   LEUKOCYTESUR TRACE (A) 12/20/2023 2239   Sepsis Labs: @LABRCNTIP (procalcitonin:4,lacticidven:4)  ) Recent Results (from the past 240 hours)  Resp panel by RT-PCR (RSV, Flu A&B, Covid) Anterior Nasal Swab     Status: None   Collection Time: 12/21/23  6:01 AM   Specimen: Anterior Nasal Swab  Result Value Ref Range Status   SARS Coronavirus 2 by RT PCR NEGATIVE NEGATIVE Final   Influenza A by PCR NEGATIVE NEGATIVE Final   Influenza B by PCR NEGATIVE NEGATIVE Final    Comment: (NOTE) The Xpert Xpress SARS-CoV-2/FLU/RSV plus assay is intended as an aid in  the diagnosis of influenza from Nasopharyngeal swab specimens and should not be used as a sole basis for treatment. Nasal washings and aspirates are unacceptable for Xpert Xpress SARS-CoV-2/FLU/RSV testing.  Fact Sheet for Patients: bloggercourse.com  Fact Sheet for Healthcare Providers: seriousbroker.it  This test is not yet approved or cleared by the [place]  FDA and has been authorized for detection and/or diagnosis of SARS-CoV-2 by FDA under an Emergency Use Authorization (EUA). This EUA will remain in effect (meaning this test can be used) for the duration of the COVID-19 declaration under Section 564(b)(1) of the Act, 21 U.S.C. section 360bbb-3(b)(1), unless the authorization is terminated or revoked.     Resp Syncytial Virus by PCR NEGATIVE NEGATIVE Final    Comment: (NOTE) Fact Sheet for Patients: bloggercourse.com  Fact Sheet for Healthcare  Providers: seriousbroker.it  This test is not yet approved or cleared by the [place]  FDA and has been authorized for detection and/or diagnosis of SARS-CoV-2 by FDA under an Emergency Use Authorization (EUA). This EUA will remain in effect (meaning this test can be used) for the duration of the COVID-19 declaration under Section 564(b)(1) of the Act, 21 U.S.C. section 360bbb-3(b)(1), unless the authorization is terminated or revoked.  Performed at [facility], [address], [place], [place] [address]       Studies: DG Shoulder Left Port Result Date: 12/21/2023 EXAM: 1 View Xray Of The Left Shoulder 12/21/2023 02:20:44 AM COMPARISON: None available. CLINICAL HISTORY: Left shoulder pain. Rt shoulder pain post fall on thinner FINDINGS: BONES AND JOINTS: Advanced glenohumeral degenerative arthritis with remodeling of the glenoid fossa. Moderate acromioclavicular degenerative arthritis. SOFT TISSUES: Coronary artery bypass grafting in the left icd incidentally noted. No abnormal calcifications. Visualized lung is unremarkable. IMPRESSION: 1. Advanced glenohumeral degenerative arthritis with remodeling of the glenoid fossa. 2. Moderate acromioclavicular degenerative arthritis. Electronically signed by: Dorethia Molt MD 12/21/2023 02:31 AM EDT RP Workstation: [id number]   CT Cervical Spine Wo Contrast Result Date: 12/20/2023 EXAM: CT CERVICAL SPINE WITHOUT CONTRAST 12/20/2023 09:28:00 PM TECHNIQUE: CT of the cervical spine was performed without the administration of intravenous contrast. Multiplanar reformatted images are provided for review. Automated exposure control, iterative reconstruction, and/or weight based adjustment of the mA/kV was utilized to reduce the radiation dose to as low as reasonably achievable. COMPARISON: None available. CLINICAL HISTORY: Neck trauma (Age >= 65y). Chief complaints; Fall on Blood Thinners; CT Cervical Spine Wo Contrast;  Neck trauma (Age >= 65y) FINDINGS: BONES AND ALIGNMENT: No acute fracture or traumatic malalignment. DEGENERATIVE CHANGES: No significant degenerative changes. SOFT TISSUES: No prevertebral soft tissue swelling. IMPRESSION: 1. No acute abnormality of the cervical spine. Electronically signed by: Pinkie Pebbles MD 12/20/2023 09:36 PM EDT RP Workstation: [id number]   CT Head Wo Contrast Result Date: 12/20/2023 EXAM: CT HEAD WITHOUT CONTRAST 12/20/2023 09:11:00 PM TECHNIQUE: CT of the head was performed without the administration of intravenous contrast. Automated exposure control, iterative reconstruction, and/or weight based adjustment of the mA/kV was utilized to reduce the radiation dose to as low as reasonably achievable. COMPARISON: 04/08/2023 CLINICAL HISTORY: Head trauma, minor (Age >= 65y). Chief complaints; Fall on Blood Thinners; CT Head Wo Contrast; Head trauma, Minor (Age >= 65y). FINDINGS: BRAIN AND VENTRICLES: No acute hemorrhage. No evidence of acute infarct. Small remote lacunar infarct in left thalamus. Periventricular and subcortical white matter hypoattenuation, likely sequela of chronic small vessel ischemic disease. Mild parenchymal volume loss. Remote infarct in right cerebellar hemisphere. No hydrocephalus. No extra-axial collection. No mass effect or  midline shift. Intracranial atherosclerosis. ORBITS: Bilateral lens implants. SINUSES: No acute abnormality. SOFT TISSUES AND SKULL: No acute soft tissue abnormality. No skull fracture. IMPRESSION: 1. No acute intracranial abnormality. Electronically signed by: Pinkie Pebbles MD 12/20/2023 09:34 PM EDT RP Workstation: [id number]   DG Pelvis Portable Result Date: 12/20/2023 EXAM: 1 Or 2 View(S) Xray Of The Pelvis 12/20/2023 08:52:44 Pm COMPARISON: None Available. CLINICAL HISTORY: Trauma. Pt Had A Fall From Toilet, Pt [employer] And Landed On Lt Hip/Knee FINDINGS: BONES AND JOINTS: Acute Left Hip Intertrochanteric Fracture And Avulsion  Of The Lesser Trochanter. Right Total Hip Arthroplasty. Moderate-To-Severe Degenerative Changes Of The Left Hip. SOFT TISSUES: Surgical Clips In Left Groin Noted. Vascular Calcifications. IMPRESSION: 1. Acute left hip intertrochanteric fracture and avulsion of the lesser trochanter. 2. Right total hip arthroplasty. 3. Moderate-to-severe degenerative changes of the left hip. Electronically signed by: Dorethia Molt MD 12/20/2023 09:16 PM EDT RP Workstation: [id number]   DG Knee Left Port Result Date: 12/20/2023 EXAM: 2 VIEW(S) XRAY OF THE  Knee 12/20/2023 08:52:44 PM COMPARISON: None available. CLINICAL HISTORY: Blunt Trauma. Pt had a fall from toilet, pt fell forward and landed on lt hip/knee FINDINGS: BONES AND JOINTS: Mild tricompartmental degenerative changes. No acute fracture. No focal osseous lesion. No joint dislocation. SOFT TISSUES: Vascular calcifications. The soft tissues are unremarkable. IMPRESSION: 1. No acute fracture or dislocation. 2. Mild tricompartmental degenerative changes. Electronically signed by: Dorethia Molt MD 12/20/2023 09:15 PM EDT RP Workstation: [id number]   DG Chest Port 1 View Result Date: 12/20/2023 EXAM: 1 VIEW XRAY OF THE CHEST 12/20/2023 08:54:45 PM COMPARISON: 04/21/2023 CLINICAL HISTORY: Trauma. Pt had a fall from toilet, pt fell forward and landed on lt hip/knee FINDINGS: LUNGS AND PLEURA: Trace interstitial pulmonary edema suspected. No pneumothorax. No pleural effusion. HEART AND MEDIASTINUM: Left pacemaker stable in place. Cardiomegaly, unchanged. Status post coronary artery bypass grafting. BONES AND SOFT TISSUES: Atherosclerotic aortic calcifications. Osseous structures are age appropriate. IMPRESSION: 1. No acute process. 2. Cardiomegaly, unchanged. 3. Trace interstitial pulmonary edema suspected. No pneumothorax or pleural effusion. Electronically signed by: Dorethia Molt MD 12/20/2023 09:14 PM EDT RP Workstation: [id number]   DG FEMUR PORT 1V LEFT Result  Date: 12/20/2023 EXAM: 2 Views Xray Of The Left Femur 12/20/2023 08:51:49 Pm COMPARISON: None Available. CLINICAL HISTORY: Blunt Trauma. Pt Had A Fall From Toilet, Pt [employer] And Landed On Lt Hip/Knee FINDINGS: BONES AND JOINTS: Acute Intertrochanteric Fracture. Avulsion Of The Lesser Trochanter. Moderate Degenerative Changes Of Left Hip. Mild Degenerative Changes Of Knee. SOFT TISSUES: Surgical Clips Overlying Left Hip. Partially Visualized Right Hip Arthroplasty. Vascular Calcifications. IMPRESSION: 1. Acute intertrochanteric fracture and avulsion of the lesser trochanter. 2. Moderate degenerative changes of left hip and mild degenerative changes of knee. Electronically signed by: Dorethia Molt MD 12/20/2023 09:11 PM EDT RP Workstation: [id number]    Scheduled Meds:  [MAR Hold] atorvastatin   80 mg Oral QHS   dexamethasone  (DECADRON ) injection  8 mg Intravenous Once   [MAR Hold] levothyroxine   50 mcg Oral Q0600   [MAR Hold] metoprolol  succinate  12.5 mg Oral Daily   [MAR Hold] sodium chloride  flush  3 mL Intravenous Q12H    Continuous Infusions:  [START ON 12/22/2023]  ceFAZolin (ANCEF) IV     lactated ringers  10 mL/hr at 12/21/23 1420   tranexamic acid       LOS: 1 day     Terry LOISE Hurst, MD [facility] Pager [phone]  If 7PM-7AM, please contact night-coverage [email or web address] Password [email or web address] 12/21/2023, 3:01 PM

## 2023-12-21 NOTE — Op Note (Signed)
 Date of Surgery: 12/21/2023  INDICATIONS: Stephanie Atkinson is a 89 y.o.-year-old female who sustained a left hip fracture. The patient is indicated to undergo operative fixation of her left intertrochanteric femur fracture for early mobilization, stabilization of the fracture and pain control. The risks and benefits of the procedure discussed with the patient prior to the procedure and all questions were answered; consent was obtained.   PREOPERATIVE DIAGNOSIS: left intertrochanteric femur fracture   POSTOPERATIVE DIAGNOSIS: Same   PROCEDURE: Treatment of intertrochanteric fracture with intramedullary implant. CPT 72754   SURGEON: Rankin Pizza, M.D.   Assistants: Rosina Calin, PA  INDICATIONS FOR SURGICAL ASSISTANTS: During the operation, the services of a qualified surgical assistant were medically necessary and indicated to provide adequate surgical exposure and necessary to maintain the limb in the proper position during the procedure to carry out the operation safely and effectively.  With the qualified assistant being present, we would have extended up the operative procedure and made the procedure more technically difficult to perform.   ANESTHESIA: general   IV FLUIDS AND URINE: See anesthesia record   ESTIMATED BLOOD LOSS: 20 cc  IMPLANTS:   Synthes 11 x 170 130 deg TFNA Cephalomedullary screw: 110 mm Distal interlocking screw: 38 mm  DRAINS: None.   COMPLICATIONS: None.   DESCRIPTION OF PROCEDURE: The patient was brought to the operating room and placed supine on the operating table. The patient's leg had been signed prior to the procedure. The patient had the anesthesia placed by the anesthesiologist. The prep verification and incision time-outs were performed to confirm that this was the correct patient, site, side and location. The patient had an SCD on the opposite lower extremity. The patient did receive antibiotics prior to the incision and was re-dosed during the  procedure as needed at indicated intervals. The patient was positioned on the hana table with the table in traction and internal rotation to reduce the hip. The contralateral leg was placed in a scissor position and all bony prominences were well-padded. The patient had the lower extremity prepped and draped in the standard surgical fashion. The incision was made 4 finger breadths superior to the greater trochanter. A guide pin was inserted into the tip of the greater trochanter under fluoroscopic guidance. An opening reamer was used to gain access to the femoral canal. The nail length was measured and inserted down the femoral canal to its proper depth. The appropriate version of insertion for the lag screw was found under fluoroscopy. A pin was inserted up the femoral neck through the jig. The length of the lag screw was then measured. The lag screw was inserted as near to center-center in the head as possible.  The compression screw was used to compress across the fracture. Compression was visualized on serial xrays.   We next turned our attention to the distal interlocking screw.  This was placed through the drill guide of the nail inserter.  A small incision was made overlying the lateral thigh at the screw site, and a tonsil was used to disect down to bone.  A drill pass was made through the jig and across the nail through both cortices.  This was measured, and the appropriate screw was placed under hand power and found to have good bite.    The wound was copiously irrigated with saline and the subcutaneous layer closed with 0 vicryl for the deep tissues,  2.0 vicryl and the skin was reapproximated with staples. The wounds were cleaned and dried  a final time and a sterile dressing was placed. The hip was taken through a range of motion at the end of the case under fluoroscopic imaging to visualize the approach-withdraw phenomenon and confirm implant length in the head. The patient was then awakened from  anesthesia and taken to the recovery room in stable condition. All counts were correct at the end of the case.   POSTOPERATIVE PLAN: The patient will be weight bearing as tolerated and will return in 2 weeks for staple removal and the patient will receive DVT prophylaxis based on other medications, activity level, and risk ratio of bleeding to thrombosis.     Rankin Pizza, MD [facility] 5:04 PM

## 2023-12-21 NOTE — Transfer of Care (Signed)
 Immediate Anesthesia Transfer of Care Note  Patient: Stephanie Atkinson  Procedure(s) Performed: FIXATION, FRACTURE, INTERTROCHANTERIC, WITH INTRAMEDULLARY ROD (Left)  Patient Location: PACU  Anesthesia Type:General  Level of Consciousness: awake and alert   Airway & Oxygen Therapy: Patient Spontanous Breathing and Patient connected to nasal cannula oxygen  Post-op Assessment: Report given to RN and Post -op Vital signs reviewed and stable  Post vital signs: Reviewed and stable  Last Vitals:  Vitals Value Taken Time  BP 170/69 12/21/23 17:00  Temp 36.5 C 12/21/23 16:50  Pulse 60 12/21/23 17:07  Resp 17 12/21/23 17:07  SpO2 100 % 12/21/23 17:07  Vitals shown include unfiled device data.  Last Pain:  Vitals:   12/21/23 1650  TempSrc:   PainSc: 8       Patients Stated Pain Goal: 3 (12/21/23 1650)  Complications: No notable events documented.

## 2023-12-21 NOTE — H&P (Signed)
 History and Physical    Stephanie Atkinson FMW:[id number] DOB: October 06, 1924 DOA: 12/20/2023  PCP: [facility], [facility]   Patient coming from: Home   Chief Complaint:  Chief Complaint  Patient presents with   Fall on Blood Thinners    HPI:  Stephanie Atkinson is a 89 y.o. female with hx of CAD s/p CABG '06, HFrEF, Afib on AC, CKD3b, HTN, HLD, Hypothyroidism, who presented after a ground level fall. Reports she was on the toilet and felt that her right leg was becoming heavy, and when she stood up she fell onto her left side. Heard a crack in her left hip + sudden pain. Also c/o pain in the left shoulder and knee. She has no headache, or vision changes, speech changes, numbness or weakness in her extremities. Thinks RLE sensation may have been from being on the toilet too long. She lives alone in [place] and is independent will all ADLs and most IADLs, has someone come help with her laundry. She remains active and goes on walks, does chores around the house, up and down stairs without issue. No exertional chest pain. She takes eliquis  BID with last dose 10/25 in the AM.     Review of Systems:  ROS complete and negative except as marked above   Allergies  Allergen Reactions   Penicillins Hives   Sulfa Antibiotics Hives and Swelling   Gabapentin  Other (See Comments)    Confusion   Lyrica [Pregabalin] Other (See Comments)    Confusion    Prior to Admission medications   Medication Sig Start Date End Date Taking? Authorizing Provider  acetaminophen  (TYLENOL ) 500 MG tablet Take 500 mg by mouth See admin instructions. Take 500 mg in am, 1000 mg at 2 pm, 1000 mg 6 pm and 500 mg in pm by mouth.    [provider]  apixaban  (ELIQUIS ) 2.5 MG TABS tablet Take 1 tablet (2.5 mg total) by mouth 2 (two) times daily. 04/24/23   Leotis Bogus, MD  atorvastatin  (LIPITOR ) 80 MG tablet Take 80 mg by mouth at bedtime.     [provider]  Diclofenac  Sodium (VOLTAREN  EX) Apply 1 Dose topically  daily as needed (Pain).    [provider]  fluticasone  (FLONASE ) 50 MCG/ACT nasal spray Place 2 sprays into both nostrils daily. 04/25/23 05/25/23  Leotis Bogus, MD  levothyroxine  (SYNTHROID , LEVOTHROID) 50 MCG tablet Take 50 mcg by mouth daily before breakfast.     [provider]  lidocaine  (LIDODERM ) 5 % Place 1 patch onto the skin daily as needed (Pain). 02/25/23   [provider]  loratadine  (CLARITIN ) 10 MG tablet Take 1 tablet (10 mg total) by mouth daily. 04/25/23 05/25/23  Leotis Bogus, MD  losartan  (COZAAR ) 25 MG tablet Take 1 tablet (25 mg total) by mouth daily. 04/25/23 05/25/23  Leotis Bogus, MD  metoprolol  succinate (TOPROL -XL) 25 MG 24 hr tablet Take 0.5 tablets (12.5 mg total) by mouth daily. 04/25/23 06/24/23  Leotis Bogus, MD  Polyethyl Glycol-Propyl Glycol (SYSTANE OP) Place 1 drop into both eyes daily as needed (dry eye).    [provider]  torsemide  (DEMADEX ) 20 MG tablet Take 1 tablet (20 mg total) by mouth daily. 04/24/23 05/24/23  Leotis Bogus, MD    Past Medical History:  Diagnosis Date   Chronic kidney disease, stage 3 (HCC)    Coronary artery disease    History of UTI    Hx of CABG 2006   LIMA-LAD, free RIMA-PDA, Radial-OM1-OM2   Hyperlipidemia  Hypertension    Hypothyroid    MI, old        PAF (paroxysmal atrial fibrillation) (HCC)    was on amio, d/c'd due to prolonged PR interval, rate control   Presence of permanent cardiac pacemaker 07/16/2017   Presence of stent in right coronary artery 08/2010   RIMA-PDA occluded, DES stent placed   Syncope 08/2010   bradycardic, beta blocker decreased, also felt to be dehydrated    Past Surgical History:  Procedure Laterality Date   CARDIAC CATHETERIZATION  12/10/2010   dr. alm harding, revealing a atretic bypass to her right with patent distal RCA stent   CAROTID STENT  2012   CHOLECYSTECTOMY     CORONARY ARTERY BYPASS GRAFT  2006   LIMA-LAD, free RIMA-PDA,  Radial-OM1-OM2   DOPPLER ECHOCARDIOGRAPHY  09/27/2010   mild asymmetric left ventricular hypertrophy, left ventricular systolic function is low normal, ejection fraction = 50-55%, the LA is moderate dilated, the RA is mildly dilated, no significant valvular disease   INSERT / REPLACE / REMOVE PACEMAKER  07/16/2017   LEFT HEART CATHETERIZATION WITH CORONARY/GRAFT ANGIOGRAM N/A 10/12/2012   Procedure: LEFT HEART CATHETERIZATION WITH EL BILE;  Surgeon: Alm LELON Clay, MD;  Location: [facility] CATH LAB;  Service: Cardiovascular;  Laterality: N/A;   LOOP RECORDER IMPLANT N/A 10/15/2012   Procedure: LOOP RECORDER IMPLANT;  Surgeon: Jerel Balding, MD;  Location: [facility] CATH LAB;  Service: Cardiovascular;  Laterality: N/A;   LOOP RECORDER REMOVAL  07/16/2017   LOOP RECORDER REMOVAL N/A 07/16/2017   Procedure: LOOP RECORDER REMOVAL;  Surgeon: Balding Jerel, MD;  Location: [facility] INVASIVE CV LAB;  Service: Cardiovascular;  Laterality: N/A;   NM MYOVIEW  LTD  11/29/2008   post stress left ventricle is normal in size, post stress ejection fraction is 67% global left ventricular systolic function is normal, normal myocardial perfusion study, abnormal myocardial perfusion study, low risk scan   PACEMAKER IMPLANT N/A 07/16/2017   Procedure: PACEMAKER IMPLANT;  Surgeon: Balding Jerel, MD;  Location: [facility] INVASIVE CV LAB;  Service: Cardiovascular;  Laterality: N/A;   THYROIDECTOMY     TOTAL HIP ARTHROPLASTY Right      reports that she has never smoked. Her smokeless tobacco use includes snuff. She reports that she does not drink alcohol  and does not use drugs.  Family History  Problem Relation Age of Onset   CAD Mother    CAD Maternal Grandmother      Physical Exam: Vitals:   12/20/23 2029 12/20/23 2200 12/20/23 2300 12/21/23 0000  BP:  (!) 141/53 (!) 140/48 (!) 135/49  Pulse:  60 (!) 59 60  Resp:  (!) 22 (!) 24 18  Temp:      TempSrc:      SpO2:  100% 100% 100%  Weight: 57.6 kg     Height:  5' 2 (1.575 m)       Gen: Awake, alert, appears younger than stated age  CV: Regular, normal S1, S2, no murmurs  Resp: Normal WOB, CTAB  Abd: Flat, normoactive, nontender MSK: Symmetric, no edema. Distally NV intact.  Skin: No rashes or lesions to exposed skin  Neuro: Alert and interactive  Psych: euthymic, appropriate    Data review:   Labs reviewed, notable for:   T. bili 1.8 AST 50, other LFT within normal Creatinine 1.35 (b/l 1-1.1) Hemoglobin 11 Lactate 1.3 INR 1.5 UA negative for infection  Micro:  Results for orders placed or performed during the hospital encounter of 09/28/23  Culture, blood (routine  x 2)     Status: None   Collection Time: 09/28/23  5:57 PM   Specimen: BLOOD  Result Value Ref Range Status   Specimen Description BLOOD BLOOD LEFT FOREARM  Final   Special Requests   Final    AEROBIC BOTTLE ONLY Blood Culture results may not be optimal due to an inadequate volume of blood received in culture bottles   Culture   Final    NO GROWTH 5 DAYS Performed at [facility], [address], [place], [place] [address]    Report Status 10/03/2023 FINAL  Final  Urine Culture     Status: Abnormal   Collection Time: 09/28/23 10:06 PM   Specimen: Urine, Clean Catch  Result Value Ref Range Status   Specimen Description URINE, CLEAN CATCH  Final   Special Requests NONE  Final   Culture (A)  Final    <10,000 COLONIES/mL INSIGNIFICANT GROWTH Performed at [facility], [address] [place], [place] [address]    Report Status 09/29/2023 FINAL  Final    Imaging reviewed:  CT Cervical Spine Wo Contrast Result Date: 12/20/2023 EXAM: CT CERVICAL SPINE WITHOUT CONTRAST 12/20/2023 09:28:00 PM TECHNIQUE: CT of the cervical spine was performed without the administration of intravenous contrast. Multiplanar reformatted images are provided for review. Automated exposure control, iterative reconstruction, and/or weight based adjustment of the mA/kV was  utilized to reduce the radiation dose to as low as reasonably achievable. COMPARISON: None available. CLINICAL HISTORY: Neck trauma (Age >= 65y). Chief complaints; Fall on Blood Thinners; CT Cervical Spine Wo Contrast; Neck trauma (Age >= 65y) FINDINGS: BONES AND ALIGNMENT: No acute fracture or traumatic malalignment. DEGENERATIVE CHANGES: No significant degenerative changes. SOFT TISSUES: No prevertebral soft tissue swelling. IMPRESSION: 1. No acute abnormality of the cervical spine. Electronically signed by: Pinkie Pebbles MD 12/20/2023 09:36 PM EDT RP Workstation: [id number]   CT Head Wo Contrast Result Date: 12/20/2023 EXAM: CT HEAD WITHOUT CONTRAST 12/20/2023 09:11:00 PM TECHNIQUE: CT of the head was performed without the administration of intravenous contrast. Automated exposure control, iterative reconstruction, and/or weight based adjustment of the mA/kV was utilized to reduce the radiation dose to as low as reasonably achievable. COMPARISON: 04/08/2023 CLINICAL HISTORY: Head trauma, minor (Age >= 65y). Chief complaints; Fall on Blood Thinners; CT Head Wo Contrast; Head trauma, Minor (Age >= 65y). FINDINGS: BRAIN AND VENTRICLES: No acute hemorrhage. No evidence of acute infarct. Small remote lacunar infarct in left thalamus. Periventricular and subcortical white matter hypoattenuation, likely sequela of chronic small vessel ischemic disease. Mild parenchymal volume loss. Remote infarct in right cerebellar hemisphere. No hydrocephalus. No extra-axial collection. No mass effect or midline shift. Intracranial atherosclerosis. ORBITS: Bilateral lens implants. SINUSES: No acute abnormality. SOFT TISSUES AND SKULL: No acute soft tissue abnormality. No skull fracture. IMPRESSION: 1. No acute intracranial abnormality. Electronically signed by: Pinkie Pebbles MD 12/20/2023 09:34 PM EDT RP Workstation: [id number]   DG Pelvis Portable Result Date: 12/20/2023 EXAM: 1 Or 2 View(S) Xray Of The Pelvis  12/20/2023 08:52:44 Pm COMPARISON: None Available. CLINICAL HISTORY: Trauma. Pt Had A Fall From Toilet, Pt [employer] And Landed On Lt Hip/Knee FINDINGS: BONES AND JOINTS: Acute Left Hip Intertrochanteric Fracture And Avulsion Of The Lesser Trochanter. Right Total Hip Arthroplasty. Moderate-To-Severe Degenerative Changes Of The Left Hip. SOFT TISSUES: Surgical Clips In Left Groin Noted. Vascular Calcifications. IMPRESSION: 1. Acute left hip intertrochanteric fracture and avulsion of the lesser trochanter. 2. Right total hip arthroplasty. 3. Moderate-to-severe degenerative changes of the left  hip. Electronically signed by: Dorethia Molt MD 12/20/2023 09:16 PM EDT RP Workstation: [id number]   DG Knee Left Port Result Date: 12/20/2023 EXAM: 2 VIEW(S) XRAY OF THE  Knee 12/20/2023 08:52:44 PM COMPARISON: None available. CLINICAL HISTORY: Blunt Trauma. Pt had a fall from toilet, pt fell forward and landed on lt hip/knee FINDINGS: BONES AND JOINTS: Mild tricompartmental degenerative changes. No acute fracture. No focal osseous lesion. No joint dislocation. SOFT TISSUES: Vascular calcifications. The soft tissues are unremarkable. IMPRESSION: 1. No acute fracture or dislocation. 2. Mild tricompartmental degenerative changes. Electronically signed by: Dorethia Molt MD 12/20/2023 09:15 PM EDT RP Workstation: [id number]   DG Chest Port 1 View Result Date: 12/20/2023 EXAM: 1 VIEW XRAY OF THE CHEST 12/20/2023 08:54:45 PM COMPARISON: 04/21/2023 CLINICAL HISTORY: Trauma. Pt had a fall from toilet, pt fell forward and landed on lt hip/knee FINDINGS: LUNGS AND PLEURA: Trace interstitial pulmonary edema suspected. No pneumothorax. No pleural effusion. HEART AND MEDIASTINUM: Left pacemaker stable in place. Cardiomegaly, unchanged. Status post coronary artery bypass grafting. BONES AND SOFT TISSUES: Atherosclerotic aortic calcifications. Osseous structures are age appropriate. IMPRESSION: 1. No acute process. 2. Cardiomegaly,  unchanged. 3. Trace interstitial pulmonary edema suspected. No pneumothorax or pleural effusion. Electronically signed by: Dorethia Molt MD 12/20/2023 09:14 PM EDT RP Workstation: [id number]   DG FEMUR PORT 1V LEFT Result Date: 12/20/2023 EXAM: 2 Views Xray Of The Left Femur 12/20/2023 08:51:49 Pm COMPARISON: None Available. CLINICAL HISTORY: Blunt Trauma. Pt Had A Fall From Toilet, Pt [employer] And Landed On Lt Hip/Knee FINDINGS: BONES AND JOINTS: Acute Intertrochanteric Fracture. Avulsion Of The Lesser Trochanter. Moderate Degenerative Changes Of Left Hip. Mild Degenerative Changes Of Knee. SOFT TISSUES: Surgical Clips Overlying Left Hip. Partially Visualized Right Hip Arthroplasty. Vascular Calcifications. IMPRESSION: 1. Acute intertrochanteric fracture and avulsion of the lesser trochanter. 2. Moderate degenerative changes of left hip and mild degenerative changes of knee. Electronically signed by: Dorethia Molt MD 12/20/2023 09:11 PM EDT RP Workstation: [id number]    EKG: Pending  ED Course:  Orthopedics Dr. Sharl consulted, plan for anticoagulation washout and possible OR on Monday, to consult in the morning.    Assessment/Plan:  89 y.o. female with hx CAD s/p CABG '06, HFrEF, Afib on AC, CKD3b, HTN, HLD, Hypothyroidism, who presented after a ground level fall complicated by left hip intertrochanteric fracture.    Left femoral intertrochanteric fracture  Ground-level fall -Orthopedic surgery consulted, plan for anticoagulation washout and possible OR on Monday, to consult in the morning.  -regular diet for now, NPO after MN Sunday  -Anticoagulant holding Eliquis    -DVT prophylaxis to be readdressed postop; SCD for now  -Tele monitoring periop -PT/ OT eval postop ordered   -Pain mgmt: Tylenol  prn for mild, oxycodone  2.5/5 mg p.o. every 4 hours prn for moderate/severe, Dilaudid  0.5 mg IV every 4 hours prn for breakthrough  Preoperative risk stratification / optimization  Hx CAD,  CABG. HfrEF, no severe valvular disease, Afib. She has good activity tolerance for her age. RCRI is 2, 5% risk of MACE, may underestimate her risk given advanced age and comorbidities.  - EKG preop - Do not feel additional cardiac testing or intervention would alter management.  - Hold home torsemide  and losartan  preoperatively, continue beta-blocker.   Chronic medical problems: CAD s/p CABG: Holding DOAC, continue statin HFrEF: ? Trace interstitial edema, continue home Torsemide . Hold losartan  preop.  Continue beta-blocker per below A-fib on AC: Hold home Eliquis . Continue home Metoprolol  daily CKD 3B: Baseline creatinine near 1-1.1.  Slightly  up at 1.3.  Holding diuretic.  Oral hydration, trend creatinine Hypertension: See HFrEF/A-fib Hyperlipidemia: Continue home Atorvastatin  Hypothyroidism: Continue home levothyroxine  Chronic pain: Normally on tramadol , see acute pain management above.  Body mass index is 23.23 kg/m.    DVT prophylaxis:  SCDs Code Status:  DNR/DNI(Do NOT Intubate); confirmed with patient. Has DDNR on file  Diet:  Diet Orders (From admission, onward)     Start     Ordered   12/21/23 0001  Diet NPO time specified Except for: Sips with Meds  Diet effective midnight       Question:  Except for  Answer:  Stephanie Atkinson with Meds   12/20/23 2217           Family Communication:  None   Consults:  Orthopedics   Admission status:   Inpatient, Med-Surg  Severity of Illness: The appropriate patient status for this patient is INPATIENT. Inpatient status is judged to be reasonable and necessary in order to provide the required intensity of service to ensure the patient's safety. The patient's presenting symptoms, physical exam findings, and initial radiographic and laboratory data in the context of their chronic comorbidities is felt to place them at high risk for further clinical deterioration. Furthermore, it is not anticipated that the patient will be medically stable for  discharge from the hospital within 2 midnights of admission.   * I certify that at the point of admission it is my clinical judgment that the patient will require inpatient hospital care spanning beyond 2 midnights from the point of admission due to high intensity of service, high risk for further deterioration and high frequency of surveillance required.*   Dorn Jacobsen, MD [facility]  How to contact the [facility] Attending or Consulting provider 7A - 7P or covering provider during after hours 7P -7A, for this patient.  Check the care team in [facility] and look for a) attending/consulting [facility] provider listed and b) the [facility] team listed Log into [email or web address] and use [facility]'s universal password to access. If you do not have the password, please contact the hospital operator. Locate the [facility] provider you are looking for under [facility] and page to a number that you can be directly reached. If you still have difficulty reaching the provider, please page the [facility] (Director on Call) for the Hospitalists listed on amion for assistance.  12/21/2023, 1:35 AM

## 2023-12-21 NOTE — Anesthesia Postprocedure Evaluation (Signed)
 Anesthesia Post Note  Patient: Stephanie Atkinson  Procedure(s) Performed: FIXATION, FRACTURE, INTERTROCHANTERIC, WITH INTRAMEDULLARY ROD (Left)     Patient location during evaluation: PACU Anesthesia Type: General Level of consciousness: sedated and patient cooperative Pain management: pain level controlled Vital Signs Assessment: post-procedure vital signs reviewed and stable Respiratory status: spontaneous breathing Cardiovascular status: stable Anesthetic complications: no   No notable events documented.  Last Vitals:  Vitals:   12/21/23 1821 12/21/23 1950  BP: (!) 155/53 (!) 129/51  Pulse: 60 64  Resp: 16 18  Temp: (!) 36.3 C 36.8 C  SpO2: 97% 92%    Last Pain:  Vitals:   12/21/23 1950  TempSrc: Oral  PainSc:                  Norleen Pope

## 2023-12-21 NOTE — Discharge Instructions (Signed)
 INSTRUCTIONS AFTER SURGERY  Remove items at home which could result in a fall. This includes throw rugs or furniture in walking pathways ICE to the affected joint every three hours while awake for 30 minutes at a time, for at least the first 3-5 days, and then as needed for pain and swelling.  Continue to use ice for pain and swelling. You may notice swelling that will progress down to the foot and ankle.  This is normal after surgery.  Elevate your leg when you are not up walking on it.   Continue to use the breathing machine you got in the hospital (incentive spirometer) which will help keep your temperature down.  It is common for your temperature to cycle up and down following surgery, especially at night when you are not up moving around and exerting yourself.  The breathing machine keeps your lungs expanded and your temperature down.   DIET:  As you were doing prior to hospitalization, we recommend a well-balanced diet.  DRESSING / WOUND CARE / SHOWERING  You may shower 3 days after surgery, but keep the wounds dry during showering.  You may use an occlusive plastic wrap (Press'n Seal for example), NO SOAKING/SUBMERGING IN THE BATHTUB.  If the bandage gets wet, change with a clean dry gauze.  If the incision gets wet, pat the wound dry with a clean towel.  ACTIVITY  Increase activity slowly as tolerated, but follow the weight bearing instructions below.   No driving for 6 weeks or until further direction given by your physician.  You cannot drive while taking narcotics.  No lifting or carrying greater than 10 lbs. until further directed by your surgeon. Avoid periods of inactivity such as sitting longer than an hour when not asleep. This helps prevent blood clots.  You may return to work once you are authorized by your doctor.     WEIGHT BEARING   Weight bearing as tolerated with assist device (walker, cane, etc) as directed, use it as long as suggested by your surgeon or therapist,  typically at least 4-6 weeks.  CONSTIPATION  Constipation is defined medically as fewer than three stools per week and severe constipation as less than one stool per week.  Even if you have a regular bowel pattern at home, your normal regimen is likely to be disrupted due to multiple reasons following surgery.  Combination of anesthesia, postoperative narcotics, change in appetite and fluid intake all can affect your bowels.   YOU MUST use at least one of the following options; they are listed in order of increasing strength to get the job done.  They are all available over the counter, and you may need to use some, POSSIBLY even all of these options:    Drink plenty of fluids (prune juice may be helpful) and high fiber foods Colace 100 mg by mouth twice a day  Senokot for constipation as directed and as needed Dulcolax (bisacodyl), take with full glass of water  Miralax (polyethylene glycol) once or twice a day as needed.  If you have tried all these things and are unable to have a bowel movement in the first 3-4 days after surgery call either your surgeon or your primary doctor.    If you experience loose stools or diarrhea, hold the medications until you stool forms back up.  If your symptoms do not get better within 1 week or if they get worse, check with your doctor.  If you experience "the worst abdominal pain ever"  or develop nausea or vomiting, please contact the office immediately for further recommendations for treatment.   ITCHING:  If you experience itching with your medications, try taking only a single pain pill, or even half a pain pill at a time.  You can also use Benadryl over the counter for itching or also to help with sleep.   TED HOSE STOCKINGS:  Use stockings on both legs until for at least 2 weeks or as directed by physician office. They may be removed at night for sleeping.  MEDICATIONS:  See your medication summary on the "After Visit Summary" that nursing will review  with you.  You may have some home medications which will be placed on hold until you complete the course of blood thinner medication.  It is important for you to complete the blood thinner medication as prescribed.  PRECAUTIONS:  If you experience chest pain or shortness of breath - call 911 immediately for transfer to the hospital emergency department.   If you develop a fever greater that 101 F, purulent drainage from wound, increased redness or drainage from wound, foul odor from the wound/dressing, or calf pain - CONTACT YOUR SURGEON.                                                   FOLLOW-UP APPOINTMENTS:  If you do not already have a post-op appointment, please call the office for an appointment to be seen by your surgeon.  Guidelines for how soon to be seen are listed in your "After Visit Summary", but are typically between 1-4 weeks after surgery.  POST-OPERATIVE OPIOID TAPER INSTRUCTIONS: It is important to wean off of your opioid medication as soon as possible. If you do not need pain medication after your surgery it is ok to stop day one. Opioids include: Codeine, Hydrocodone(Norco, Vicodin), Oxycodone(Percocet, oxycontin) and hydromorphone amongst others.  Long term and even short term use of opiods can cause: Increased pain response Dependence Constipation Depression Respiratory depression And more.  Withdrawal symptoms can include Flu like symptoms Nausea, vomiting And more Techniques to manage these symptoms Hydrate well Eat regular healthy meals Stay active Use relaxation techniques(deep breathing, meditating, yoga) Do Not substitute Alcohol to help with tapering If you have been on opioids for less than two weeks and do not have pain than it is ok to stop all together.  Plan to wean off of opioids This plan should start within one week post op of your joint replacement. Maintain the same interval or time between taking each dose and first decrease the dose.  Cut the  total daily intake of opioids by one tablet each day Next start to increase the time between doses. The last dose that should be eliminated is the evening dose.   MAKE SURE YOU:  Understand these instructions.  Get help right away if you are not doing well or get worse.    Thank you for letting us be a part of your medical care team.  It is a privilege we respect greatly.  We hope these instructions will help you stay on track for a fast and full recovery!

## 2023-12-21 NOTE — Anesthesia Procedure Notes (Addendum)
 Arterial Line Insertion Start/End10/26/2025 2:50 PM, 12/21/2023 1:14 PM Performed by: Darlyn Rush, MD, anesthesiologist  Patient location: Pre-op. Preanesthetic checklist: patient identified, IV checked, site marked, risks and benefits discussed, surgical consent, monitors and equipment checked, pre-op evaluation, timeout performed and anesthesia consent Lidocaine  1% used for infiltration Right, radial was placed Catheter size: 20 G Hand hygiene performed , maximum sterile barriers used  and Seldinger technique used  Attempts: 4 Procedure performed using ultrasound to evaluate access site. Ultrasound Notes:relevant anatomy identified, ultrasound used to visualize needle entry, vessel patent under ultrasound and image(s) printed for medical record. Following insertion, dressing applied and Biopatch. Post procedure assessment: normal and unchanged  Post procedure complications: hemorrhage, local hematoma, second provider assisted and unsuccessful attempts. Patient tolerated the procedure well with no immediate complications.

## 2023-12-21 NOTE — Consult Note (Addendum)
 ORTHOPAEDIC CONSULTATION  REQUESTING PHYSICIAN: Shona Terry SAILOR, DO  PCP:  [facility]  Chief Complaint: Fall   HPI: Stephanie Atkinson is a 89 y.o. female with hx of CAD s/p CABG '06, HFrEF, Afib on AC, CKD3b, HTN, HLD, Hypothyroidism, who presented after a ground level fall. Reports she was on the toilet and felt that her right leg was becoming heavy, and when she stood up she fell onto her left side. Heard a crack in her left hip + sudden pain. Also c/o pain in the left shoulder and knee. She has no headache, or vision changes, speech changes, numbness or weakness in her extremities. Thinks RLE sensation may have been from being on the toilet too long. She lives alone in [place] and is independent will all ADLs and most IADLs, has someone come help with her laundry. She remains active and goes on walks, does chores around the house, up and down stairs without issue. No exertional chest pain. She takes eliquis  BID with last dose 10/25 in the AM.     This morning in the ER she was sleeping comfortably when I walked up to her stretcher. She awoke to her name. She describes her fall to me as indicated above. She lives alone at her home, but has several grandchildren that live near by. She typically walks without assistive devices at home, but has a walker that she uses when out of the house. She has a history of right total hip replacement by Dr. Barbarann, which she tells me was for OA rather than fracture.   Past Medical History:  Diagnosis Date   Chronic kidney disease, stage 3 (HCC)    Coronary artery disease    History of UTI    Hx of CABG 2006   LIMA-LAD, free RIMA-PDA, Radial-OM1-OM2   Hyperlipidemia    Hypertension    Hypothyroid    MI, old        PAF (paroxysmal atrial fibrillation) (HCC)    was on amio, d/c'd due to prolonged PR interval, rate control   Presence of permanent cardiac pacemaker 07/16/2017   Presence of stent in right coronary artery 08/2010   RIMA-PDA  occluded, DES stent placed   Syncope 08/2010   bradycardic, beta blocker decreased, also felt to be dehydrated   Past Surgical History:  Procedure Laterality Date   CARDIAC CATHETERIZATION  12/10/2010   dr. alm harding, revealing a atretic bypass to her right with patent distal RCA stent   CAROTID STENT  2012   CHOLECYSTECTOMY     CORONARY ARTERY BYPASS GRAFT  2006   LIMA-LAD, free RIMA-PDA, Radial-OM1-OM2   DOPPLER ECHOCARDIOGRAPHY  09/27/2010   mild asymmetric left ventricular hypertrophy, left ventricular systolic function is low normal, ejection fraction = 50-55%, the LA is moderate dilated, the RA is mildly dilated, no significant valvular disease   INSERT / REPLACE / REMOVE PACEMAKER  07/16/2017   LEFT HEART CATHETERIZATION WITH CORONARY/GRAFT ANGIOGRAM N/A 10/12/2012   Procedure: LEFT HEART CATHETERIZATION WITH EL BILE;  Surgeon: Alm LELON Clay, MD;  Location: [facility] CATH LAB;  Service: Cardiovascular;  Laterality: N/A;   LOOP RECORDER IMPLANT N/A 10/15/2012   Procedure: LOOP RECORDER IMPLANT;  Surgeon: Jerel Balding, MD;  Location: [facility] CATH LAB;  Service: Cardiovascular;  Laterality: N/A;   LOOP RECORDER REMOVAL  07/16/2017   LOOP RECORDER REMOVAL N/A 07/16/2017   Procedure: LOOP RECORDER REMOVAL;  Surgeon: Balding Jerel, MD;  Location: [facility] INVASIVE CV LAB;  Service: Cardiovascular;  Laterality:  N/A;   NM MYOVIEW  LTD  11/29/2008   post stress left ventricle is normal in size, post stress ejection fraction is 67% global left ventricular systolic function is normal, normal myocardial perfusion study, abnormal myocardial perfusion study, low risk scan   PACEMAKER IMPLANT N/A 07/16/2017   Procedure: PACEMAKER IMPLANT;  Surgeon: Francyne Headland, MD;  Location: [facility] INVASIVE CV LAB;  Service: Cardiovascular;  Laterality: N/A;   THYROIDECTOMY     TOTAL HIP ARTHROPLASTY Right    Social History   Socioeconomic History   Marital status: Single    Spouse name: Not on file    Number of children: Not on file   Years of education: Not on file   Highest education level: Not on file  Occupational History   Not on file  Tobacco Use   Smoking status: Never   Smokeless tobacco: Current    Types: Snuff   Tobacco comments:    03/08/2023 patient still use Snuff  Vaping Use   Vaping status: Never Used  Substance and Sexual Activity   Alcohol  use: No   Drug use: No   Sexual activity: Not on file  Other Topics Concern   Not on file  Social History Narrative   Not on file   Social Drivers of Health   Financial Resource Strain: Low Risk  (03/04/2023)   Overall Financial Resource Strain (CARDIA)    Difficulty of Paying Living Expenses: Not very hard  Food Insecurity: No Food Insecurity (04/09/2023)   Hunger Vital Sign    Worried About Running Out of Food in the Last Year: Never true    Ran Out of Food in the Last Year: Never true  Transportation Needs: No Transportation Needs (04/09/2023)   PRAPARE - Administrator, Civil Service (Medical): No    Lack of Transportation (Non-Medical): No  Physical Activity: Not on file  Stress: Not on file  Social Connections: Socially Isolated (04/09/2023)   Social Connection and Isolation Panel    Frequency of Communication with Friends and Family: Twice a week    Frequency of Social Gatherings with Friends and Family: Once a week    Attends Religious Services: Never    Database Administrator or Organizations: No    Attends Banker Meetings: Never    Marital Status: Widowed   Family History  Problem Relation Age of Onset   CAD Mother    CAD Maternal Grandmother    Allergies  Allergen Reactions   Penicillins Hives   Sulfa Antibiotics Hives and Swelling   Gabapentin  Other (See Comments)    Confusion   Lyrica [Pregabalin] Other (See Comments)    Confusion   Prior to Admission medications   Medication Sig Start Date End Date Taking? Authorizing Provider  acetaminophen  (TYLENOL ) 325 MG  tablet Take 650 mg by mouth in the morning and at bedtime.   Yes [provider]  apixaban  (ELIQUIS ) 2.5 MG TABS tablet Take 1 tablet (2.5 mg total) by mouth 2 (two) times daily. 04/24/23  Yes Leotis Bogus, MD  fluticasone  (FLONASE ) 50 MCG/ACT nasal spray Place 2 sprays into both nostrils daily. Patient taking differently: Place 2 sprays into both nostrils daily as needed for allergies. 04/25/23 12/21/23 Yes Leotis Bogus, MD  levothyroxine  (SYNTHROID , LEVOTHROID) 50 MCG tablet Take 50 mcg by mouth daily before breakfast.    Yes [provider]  lidocaine  (LIDODERM ) 5 % Place 1 patch onto the skin daily as needed (Pain). 02/25/23  Yes [provider]  loratadine  (CLARITIN ) 10 MG tablet Take 1 tablet (10 mg total) by mouth daily. 04/25/23 12/21/23 Yes Leotis Bogus, MD  losartan  (COZAAR ) 25 MG tablet Take 1 tablet (25 mg total) by mouth daily. 04/25/23 12/21/23 Yes Leotis Bogus, MD  Menthol , Topical Analgesic, (BIOFREEZE EX) Apply 1 Application topically daily as needed (pain).   Yes [provider]  metoprolol  succinate (TOPROL -XL) 25 MG 24 hr tablet Take 0.5 tablets (12.5 mg total) by mouth daily. 04/25/23 12/21/23 Yes Leotis Bogus, MD  nitrofurantoin , macrocrystal-monohydrate, (MACROBID ) 100 MG capsule TAKE 1 CAPSULE BY MOUTH EVERY 12 HOURS FOR 5 DAYS 12/17/23  Yes [provider]  Polyethyl Glycol-Propyl Glycol (SYSTANE OP) Place 1 drop into both eyes daily as needed (dry eye).   Yes [provider]  potassium chloride  (MICRO-K ) 10 MEQ CR capsule Take 10 mEq by mouth daily.   Yes [provider]  torsemide  (DEMADEX ) 20 MG tablet Take 1 tablet (20 mg total) by mouth daily. 04/24/23 12/21/23 Yes Leotis Bogus, MD  traMADol  (ULTRAM ) 50 MG tablet Take 50 mg by mouth 2 (two) times daily. 06/06/23  Yes [provider]  atorvastatin  (LIPITOR ) 80 MG tablet Take 80 mg by mouth at bedtime.  Patient not taking: Reported on  12/21/2023    [provider]  PYRIDIUM 100 MG tablet Take 1 tablet 3 times a day by oral route for 2 days. Patient not taking: Reported on 12/21/2023 12/17/23   [provider]   DG Shoulder Left Port Result Date: 12/21/2023 EXAM: 1 View Xray Of The Left Shoulder 12/21/2023 02:20:44 AM COMPARISON: None available. CLINICAL HISTORY: Left shoulder pain. Rt shoulder pain post fall on thinner FINDINGS: BONES AND JOINTS: Advanced glenohumeral degenerative arthritis with remodeling of the glenoid fossa. Moderate acromioclavicular degenerative arthritis. SOFT TISSUES: Coronary artery bypass grafting in the left icd incidentally noted. No abnormal calcifications. Visualized lung is unremarkable. IMPRESSION: 1. Advanced glenohumeral degenerative arthritis with remodeling of the glenoid fossa. 2. Moderate acromioclavicular degenerative arthritis. Electronically signed by: Dorethia Molt MD 12/21/2023 02:31 AM EDT RP Workstation: [id number]   CT Cervical Spine Wo Contrast Result Date: 12/20/2023 EXAM: CT CERVICAL SPINE WITHOUT CONTRAST 12/20/2023 09:28:00 PM TECHNIQUE: CT of the cervical spine was performed without the administration of intravenous contrast. Multiplanar reformatted images are provided for review. Automated exposure control, iterative reconstruction, and/or weight based adjustment of the mA/kV was utilized to reduce the radiation dose to as low as reasonably achievable. COMPARISON: None available. CLINICAL HISTORY: Neck trauma (Age >= 65y). Chief complaints; Fall on Blood Thinners; CT Cervical Spine Wo Contrast; Neck trauma (Age >= 65y) FINDINGS: BONES AND ALIGNMENT: No acute fracture or traumatic malalignment. DEGENERATIVE CHANGES: No significant degenerative changes. SOFT TISSUES: No prevertebral soft tissue swelling. IMPRESSION: 1. No acute abnormality of the cervical spine. Electronically signed by: Pinkie Pebbles MD 12/20/2023 09:36 PM EDT RP Workstation: [id number]   CT Head  Wo Contrast Result Date: 12/20/2023 EXAM: CT HEAD WITHOUT CONTRAST 12/20/2023 09:11:00 PM TECHNIQUE: CT of the head was performed without the administration of intravenous contrast. Automated exposure control, iterative reconstruction, and/or weight based adjustment of the mA/kV was utilized to reduce the radiation dose to as low as reasonably achievable. COMPARISON: 04/08/2023 CLINICAL HISTORY: Head trauma, minor (Age >= 65y). Chief complaints; Fall on Blood Thinners; CT Head Wo Contrast; Head trauma, Minor (Age >= 65y). FINDINGS: BRAIN AND VENTRICLES: No acute hemorrhage. No evidence of acute infarct. Small remote lacunar infarct in left thalamus. Periventricular and subcortical white matter hypoattenuation, likely sequela  of chronic small vessel ischemic disease. Mild parenchymal volume loss. Remote infarct in right cerebellar hemisphere. No hydrocephalus. No extra-axial collection. No mass effect or midline shift. Intracranial atherosclerosis. ORBITS: Bilateral lens implants. SINUSES: No acute abnormality. SOFT TISSUES AND SKULL: No acute soft tissue abnormality. No skull fracture. IMPRESSION: 1. No acute intracranial abnormality. Electronically signed by: Pinkie Pebbles MD 12/20/2023 09:34 PM EDT RP Workstation: [id number]   DG Pelvis Portable Result Date: 12/20/2023 EXAM: 1 Or 2 View(S) Xray Of The Pelvis 12/20/2023 08:52:44 Pm COMPARISON: None Available. CLINICAL HISTORY: Trauma. Pt Had A Fall From Toilet, Pt [employer] And Landed On Lt Hip/Knee FINDINGS: BONES AND JOINTS: Acute Left Hip Intertrochanteric Fracture And Avulsion Of The Lesser Trochanter. Right Total Hip Arthroplasty. Moderate-To-Severe Degenerative Changes Of The Left Hip. SOFT TISSUES: Surgical Clips In Left Groin Noted. Vascular Calcifications. IMPRESSION: 1. Acute left hip intertrochanteric fracture and avulsion of the lesser trochanter. 2. Right total hip arthroplasty. 3. Moderate-to-severe degenerative changes of the left hip.  Electronically signed by: Dorethia Molt MD 12/20/2023 09:16 PM EDT RP Workstation: [id number]   DG Knee Left Port Result Date: 12/20/2023 EXAM: 2 VIEW(S) XRAY OF THE  Knee 12/20/2023 08:52:44 PM COMPARISON: None available. CLINICAL HISTORY: Blunt Trauma. Pt had a fall from toilet, pt fell forward and landed on lt hip/knee FINDINGS: BONES AND JOINTS: Mild tricompartmental degenerative changes. No acute fracture. No focal osseous lesion. No joint dislocation. SOFT TISSUES: Vascular calcifications. The soft tissues are unremarkable. IMPRESSION: 1. No acute fracture or dislocation. 2. Mild tricompartmental degenerative changes. Electronically signed by: Dorethia Molt MD 12/20/2023 09:15 PM EDT RP Workstation: [id number]   DG Chest Port 1 View Result Date: 12/20/2023 EXAM: 1 VIEW XRAY OF THE CHEST 12/20/2023 08:54:45 PM COMPARISON: 04/21/2023 CLINICAL HISTORY: Trauma. Pt had a fall from toilet, pt fell forward and landed on lt hip/knee FINDINGS: LUNGS AND PLEURA: Trace interstitial pulmonary edema suspected. No pneumothorax. No pleural effusion. HEART AND MEDIASTINUM: Left pacemaker stable in place. Cardiomegaly, unchanged. Status post coronary artery bypass grafting. BONES AND SOFT TISSUES: Atherosclerotic aortic calcifications. Osseous structures are age appropriate. IMPRESSION: 1. No acute process. 2. Cardiomegaly, unchanged. 3. Trace interstitial pulmonary edema suspected. No pneumothorax or pleural effusion. Electronically signed by: Dorethia Molt MD 12/20/2023 09:14 PM EDT RP Workstation: [id number]   DG FEMUR PORT 1V LEFT Result Date: 12/20/2023 EXAM: 2 Views Xray Of The Left Femur 12/20/2023 08:51:49 Pm COMPARISON: None Available. CLINICAL HISTORY: Blunt Trauma. Pt Had A Fall From Toilet, Pt [employer] And Landed On Lt Hip/Knee FINDINGS: BONES AND JOINTS: Acute Intertrochanteric Fracture. Avulsion Of The Lesser Trochanter. Moderate Degenerative Changes Of Left Hip. Mild Degenerative Changes Of Knee.  SOFT TISSUES: Surgical Clips Overlying Left Hip. Partially Visualized Right Hip Arthroplasty. Vascular Calcifications. IMPRESSION: 1. Acute intertrochanteric fracture and avulsion of the lesser trochanter. 2. Moderate degenerative changes of left hip and mild degenerative changes of knee. Electronically signed by: Dorethia Molt MD 12/20/2023 09:11 PM EDT RP Workstation: [id number]    Positive ROS: All other systems have been reviewed and were otherwise negative with the exception of those mentioned in the HPI and as above.  Physical Exam: General: Alert, no acute distress. Oriented and good historian.   MUSCULOSKELETAL:  Left LE:  Skin intact ROM deferred due to known fracture Intact dorsiflexion bilaterally, moving toes without difficulty Sensation intact bilateral feet  Assessment: Left intertrochanteric femur fracture Chronic Eliquis  use ABLA associated with her fracture - hgb 11.9 > 9.7  Plan:  I discussed with Stephanie Atkinson her diagnosis  of left intertroch femur fracture as well as plans for surgical fixation with IM nail  We will continue to hold Eliquis   New plan for OR today with Dr. Teresa for IM nail  NPO now NWB LLE for now  Will likely require SNF placement post op unless one of her grandchildren is able to care for her as she lives alone at home   Stephanie Atkinson, [place] Cell [phone]   12/21/2023 9:05 AM

## 2023-12-21 NOTE — Anesthesia Procedure Notes (Addendum)
 Procedure Name: Intubation Date/Time: 12/21/2023 3:34 PM  Performed by: Celia Alan HERO, CRNAPre-anesthesia Checklist: Emergency Drugs available, Suction available, Patient being monitored, Timeout performed and Patient identified Patient Re-evaluated:Patient Re-evaluated prior to induction Oxygen Delivery Method: Circle system utilized Preoxygenation: Pre-oxygenation with 100% oxygen Induction Type: IV induction Ventilation: Mask ventilation without difficulty Laryngoscope Size: Glidescope and 3 Grade View: Grade I Tube type: Oral Tube size: 7.0 mm Number of attempts: 1 Airway Equipment and Method: Stylet and Video-laryngoscopy Placement Confirmation: ETT inserted through vocal cords under direct vision, positive ETCO2 and breath sounds checked- equal and bilateral Secured at: 21 cm Tube secured with: Tape Dental Injury: Teeth and Oropharynx as per pre-operative assessment  Comments: Glidescope intubation due to stiff neck and suboptimal positioning in hospital bed for intubation.

## 2023-12-22 DIAGNOSIS — S72142A Displaced intertrochanteric fracture of left femur, initial encounter for closed fracture: Secondary | ICD-10-CM | POA: Diagnosis not present

## 2023-12-22 LAB — BASIC METABOLIC PANEL WITH GFR
Anion gap: 10 (ref 5–15)
BUN: 28 mg/dL — ABNORMAL HIGH (ref 8–23)
CO2: 23 mmol/L (ref 22–32)
Calcium: 9.4 mg/dL (ref 8.9–10.3)
Chloride: 103 mmol/L (ref 98–111)
Creatinine, Ser: 1.37 mg/dL — ABNORMAL HIGH (ref 0.44–1.00)
GFR, Estimated: 35 mL/min — ABNORMAL LOW (ref >60)
Glucose, Bld: 137 mg/dL — ABNORMAL HIGH (ref 70–99)
Potassium: 4.2 mmol/L (ref 3.5–5.1)
Sodium: 136 mmol/L (ref 135–145)

## 2023-12-22 LAB — CBC
HCT: 26.8 % — ABNORMAL LOW (ref 36.0–46.0)
Hemoglobin: 8.5 g/dL — ABNORMAL LOW (ref 12.0–15.0)
MCH: 27.7 pg (ref 26.0–34.0)
MCHC: 31.7 g/dL (ref 30.0–36.0)
MCV: 87.3 fL (ref 80.0–100.0)
Platelets: 138 K/uL — ABNORMAL LOW (ref 150–400)
RBC: 3.07 MIL/uL — ABNORMAL LOW (ref 3.87–5.11)
RDW: 13.4 % (ref 11.5–15.5)
WBC: 5.8 K/uL (ref 4.0–10.5)
nRBC: 0 % (ref 0.0–0.2)

## 2023-12-22 LAB — MAGNESIUM: Magnesium: 2.2 mg/dL (ref 1.7–2.4)

## 2023-12-22 LAB — PHOSPHORUS: Phosphorus: 2.9 mg/dL (ref 2.5–4.6)

## 2023-12-22 MED ORDER — APIXABAN 2.5 MG PO TABS
2.5000 mg | ORAL_TABLET | Freq: Two times a day (BID) | ORAL | Status: DC
  Administered 2023-12-22 – 2023-12-24 (×5): 2.5 mg via ORAL
  Filled 2023-12-22 (×5): qty 1

## 2023-12-22 NOTE — Progress Notes (Signed)
 PROGRESS NOTE [name]  FMW:[id number] DOB: 1924/07/03 DOA: 12/20/2023 PCP: [facility]  Brief Narrative/Hospital Course: Stephanie Atkinson is a 89 y.o. female with PMH of of CAD s/p CABG '06, HFrEF, Afib on AC, CKD3b, HTN, HLD, Hypothyroidism, who presented after a ground level fall when she stood up she fell onto her left side heard a crack in her left hip + sudden pain. Also c/o pain in the left shoulder and knee.  She lives alone in ILF and is independent will all ADLs and most IADLs, has someone come help with her laundry. She remains active and goes on walks, does chores around the house, up and down stairs without issue. No exertional chest pain. She takes eliquis  BID In the ED found to have left femoral intertrochanteric fracture orthopedic was consulted and patient was admitted Labs showed creatinine of 1.4, hemoglobin at 11 g UA unremarkable COVID-negative RSV negative  Subjective: Seen and examined today Pain is controlled tolerating diet no new complaints. Awaiting for PT OT evaluation Overnight on room air, afebrile, VSS, Labs reviewed creatinine slightly up 1.3 from 1.1 hemoglobin down to 8.5 from 9.7  Assessment and plan:  Left femoral intertrochanteric fracture Ground-level fall: Preoperative assessment done on admission, holding torsemide  losartan , on home beta-blocker Therapy has been following underwent FIXATION, FRACTURE, INTERTROCHANTERIC, WITH INTRAMEDULLARY ROD (Left). Continue postop multimodal pain management DVT prophylaxis Home Eliquis  and PT OT as per orthopedics  Possible ABLA in the setting of hip fracture and surgery Anemia chronic renal disease: Hemoglobin downtrending.  Although baseline around 12 g in February, transfuse if less than 7 g Recent Labs    04/07/23 0434 04/08/23 1801 09/28/23 1752 12/20/23 2034 12/20/23 2038 12/21/23 0601 12/22/23 0600  HGB 12.1   < > 11.5* 11.0* 11.9* 9.7* 8.5*  MCV 89.0   < > 93.6 89.6  --  87.1 87.3   VITAMINB12 227  --   --   --   --   --   --   FOLATE 12.0  --   --   --   --   --   --   FERRITIN 131  --   --   --   --   --   --   TIBC 300  --   --   --   --   --   --   IRON 109  --   --   --   --   --   --    < > = values in this interval not displayed.    CAD s/p CABG: Home Eliquis  resumed continue statin  HFrEF: ? Trace interstitial edema, diuretics held for AKI. Held losartan  preop.Continue beta-blocker per below  A-fib on AC: Cont  home Eliquis   Metoprolol  daily  CKD 3B: Baseline creatinine near 1-1.1.  Slightly up at 1.3.Holding diuretic.  Oral hydration, trend creatinine Recent Labs    04/15/23 0542 04/16/23 0600 04/17/23 0555 04/20/23 0658 04/21/23 0744 04/23/23 0616 09/28/23 1752 12/20/23 2034 12/20/23 2038 12/21/23 0601 12/22/23 0600  BUN 22 25* 20 21 29* 21 21 25* 26* 24* 28*  CREATININE 1.01* 1.15* 0.98 1.14* 1.35* 0.98 1.16* 1.35* 1.40* 1.18* 1.37*  CO2 23 24 21* 24 24 21* 21* 24  --  25 23  K 4.2 4.0 4.2 4.5 4.7 4.1 3.5 4.0 4.1 3.9 4.2    Hypertension: See HFrEF/A-fib.  Vital signs are stable.  Hyperlipidemia: Continue home atorvastatin .    Hypothyroidism: Continue home levothyroxine .  Chronic  pain: Normally on tramadol , see acute pain management above   Mobility: PT Orders: Active  PT Follow up Rec:    DVT prophylaxis: SCDs Start: 12/21/23 1749 Place TED hose Start: 12/21/23 1749 SCDs Start: 12/21/23 0150 Code Status:   Code Status: Limited: Do not attempt resuscitation (DNR) -DNR-LIMITED -Do Not Intubate/DNI  Family Communication: plan of care discussed with patient at bedside. Patient status is: Remains hospitalized because of severity of illness Level of care: Med-Surg   Dispo: The patient is from: ILF            Anticipated disposition: Likely SNF Objective: Vitals last 24 hrs: Vitals:   12/21/23 1950 12/22/23 0000 12/22/23 0507 12/22/23 0838  BP: (!) 129/51 (!) 130/52 (!) 163/66 135/71  Pulse: 64 (!) 59 69 (!) 58  Resp: 18  16 19 16   Temp: 98.2 F (36.8 C) 97.8 F (36.6 C) 97.7 F (36.5 C) 98.4 F (36.9 C)  TempSrc: Oral Oral Oral   SpO2: 92% 95% 100% 98%  Weight:      Height:        Physical Examination: General exam: alert awake, oriented, older than stated age HEENT:Oral mucosa moist, Ear/Nose WNL grossly Respiratory system: Bilaterally clear BS,no use of accessory muscle Cardiovascular system: S1 & S2 +, No JVD. Gastrointestinal system: Abdomen soft,NT,ND, BS+ Nervous System: Alert, awake, moving all extremities,and following commands. Extremities: extremities warm, leg edema NEG, LLE w/ surgical site clean dry intact Skin: Warm, no rashes MSK: Normal muscle bulk,tone, power   Medications reviewed:  Scheduled Meds:  acetaminophen   1,000 mg Oral Q8H   aspirin   81 mg Oral BID   atorvastatin   80 mg Oral QHS   levothyroxine   50 mcg Oral Q0600   metoprolol  succinate  12.5 mg Oral Daily   polyethylene glycol  17 g Oral BID   senna  2 tablet Oral QHS   Continuous Infusions:  sodium chloride  75 mL/hr at 12/21/23 1840   Diet: Diet Order             Diet regular Room service appropriate? Yes; Fluid consistency: Thin  Diet effective now                    Data Reviewed: I have personally reviewed following labs and imaging studies ( see epic result tab) CBC: Recent Labs  Lab 12/20/23 2034 12/20/23 2038 12/21/23 0601 12/22/23 0600  WBC 7.5  --  6.5 5.8  HGB 11.0* 11.9* 9.7* 8.5*  HCT 34.6* 35.0* 30.5* 26.8*  MCV 89.6  --  87.1 87.3  PLT 159  --  149* 138*   CMP: Recent Labs  Lab 12/20/23 2034 12/20/23 2038 12/21/23 0601 12/22/23 0600  NA 134* 137 138 136  K 4.0 4.1 3.9 4.2  CL 99 101 103 103  CO2 24  --  25 23  GLUCOSE 101* 99 100* 137*  BUN 25* 26* 24* 28*  CREATININE 1.35* 1.40* 1.18* 1.37*  CALCIUM  9.7  --  9.8 9.4  MG  --   --  2.2 2.2  PHOS  --   --  3.0 2.9   GFR: Estimated Creatinine Clearance: 18.6 mL/min (A) (by C-G formula based on SCr of 1.37 mg/dL  (H)). Recent Labs  Lab 12/20/23 2034  AST 50*  ALT 33  ALKPHOS 99  BILITOT 1.8*  PROT 6.7  ALBUMIN 3.4*   No results for input(s): LIPASE, AMYLASE in the last 168 hours. No results for input(s): AMMONIA in the last  168 hours. Coagulation Profile:  Recent Labs  Lab 12/20/23 2034  INR 1.5*   Unresulted Labs (From admission, onward)     Start     Ordered   12/23/23 0500  Basic metabolic panel with GFR  Daily,   R      12/22/23 0925   12/22/23 0500  CBC  Daily,   R      12/21/23 1748           Antimicrobials/Microbiology: Anti-infectives (From admission, onward)    Start     Dose/Rate Route Frequency Ordered Stop   12/22/23 0600  ceFAZolin (ANCEF) IVPB 2g/100 mL premix        2 g 200 mL/hr over 30 Minutes Intravenous On call to O.R. 12/21/23 1353 12/21/23 1606   12/21/23 2200  ceFAZolin (ANCEF) IVPB 2g/100 mL premix        2 g 200 mL/hr over 30 Minutes Intravenous Every 6 hours 12/21/23 1748 12/22/23 0448         Component Value Date/Time   SDES URINE, CLEAN CATCH 09/28/2023 2206   SPECREQUEST NONE 09/28/2023 2206   CULT (A) 09/28/2023 2206    <10,000 COLONIES/mL INSIGNIFICANT GROWTH Performed at [facility], [address], [place], [place] [address]    REPTSTATUS 09/29/2023 FINAL 09/28/2023 2206    Procedures: Procedure(s) (LRB): FIXATION, FRACTURE, INTERTROCHANTERIC, WITH INTRAMEDULLARY ROD (Left)   Mennie LAMY, MD [facility] 12/22/2023, 1:53 PM

## 2023-12-22 NOTE — Plan of Care (Signed)
  Problem: Health Behavior/Discharge Planning: Goal: Ability to manage health-related needs will improve Outcome: Progressing   Problem: Clinical Measurements: Goal: Will remain free from infection Outcome: Progressing   Problem: Activity: Goal: Risk for activity intolerance will decrease Outcome: Progressing   Problem: Coping: Goal: Level of anxiety will decrease Outcome: Progressing   

## 2023-12-22 NOTE — Hospital Course (Addendum)
 Stephanie Atkinson is a 89 y.o. female with PMH of of CAD s/p CABG '06, HFrEF, Afib on AC, CKD3b, HTN, HLD, Hypothyroidism, who presented after a ground level fall when she stood up she fell onto her left side heard a crack in her left hip + sudden pain. Also c/o pain in the left shoulder and knee.  She lives alone in ILF and is independent will all ADLs and most IADLs, has someone come help with her laundry. She remains active and goes on walks, does chores around the house, up and down stairs without issue. No exertional chest pain. She takes eliquis  BID In the ED found to have left femoral intertrochanteric fracture orthopedic was consulted and patient was admitted Labs showed creatinine of 1.4, hemoglobin at 11 g UA unremarkable COVID-negative RSV negative Postoperatively remains stable, awaiting for SNF placement  Subjective: Seen and examined No new complaints Resting well oN RA Eager to go to snf today Overnight afebrile BP stable, labs this morning creatinine about the same 1.2  Discharge Diagnoses:  Left femoral intertrochanteric fracture Ground-level fall: Preoperative assessment done on admission, holding torsemide  losartan , on home beta-blocker Therapy has been following underwent FIXATION, FRACTURE, INTERTROCHANTERIC, WITH INTRAMEDULLARY ROD (Left). Continue postop multimodal pain management DVT prophylaxis Home Eliquis  and PT OT as per orthopedics.   Possible ABLA in the setting of hip fracture and surgery Anemia chronic renal disease: Hemoglobin downtrending slowly but no evidence of active bleeding.Baseline around 12 g in February, transfuse if less than 7 g. Advised to check hb in 5-7 days Recent Labs    04/07/23 0434 04/08/23 1801 12/20/23 2034 12/20/23 2038 12/21/23 0601 12/22/23 0600 12/23/23 0518  HGB 12.1   < > 11.0* 11.9* 9.7* 8.5* 8.2*  MCV 89.0   < > 89.6  --  87.1 87.3 86.2  VITAMINB12 227  --   --   --   --   --   --   FOLATE 12.0  --   --   --   --   --   --    FERRITIN 131  --   --   --   --   --   --   TIBC 300  --   --   --   --   --   --   IRON 109  --   --   --   --   --   --    < > = values in this interval not displayed.    CAD s/p CABG: Cont Eliquis  resumed continue statin  HFrEF: ?Trace interstitial edema, diuretics held for AKI. Held losartan  preop.Continue beta-blocker per below  A-fib on AC: Cont  home Eliquis   Metoprolol  daily  CKD 3B: Baseline creatinine near 1-1.1.  Overall holding stable at 1.2-1.3 resume diuretics upon discharge Recent Labs    04/17/23 0555 04/20/23 0658 04/21/23 0744 04/23/23 0616 09/28/23 1752 12/20/23 2034 12/20/23 2038 12/21/23 0601 12/22/23 0600 12/23/23 0518 12/24/23 0406  BUN 20 21 29* 21 21 25* 26* 24* 28* 36* 40*  CREATININE 0.98 1.14* 1.35* 0.98 1.16* 1.35* 1.40* 1.18* 1.37* 1.28* 1.21*  CO2 21* 24 24 21* 21* 24  --  25 23 23 24   K 4.2 4.5 4.7 4.1 3.5 4.0 4.1 3.9 4.2 4.8 4.3    Hypertension: See HFrEF/A-fib.  Vital signs are stable.  Hyperlipidemia: Continue home atorvastatin .    Hypothyroidism: Continue home levothyroxine .  Chronic pain: Normally on tramadol , see acute pain management above  Mobility: PT Orders: Active PT Follow up Rec: Skilled Nursing-Short Term Rehab (<3 Hours/Day)12/23/2023 1231    DVT prophylaxis: apixaban  (ELIQUIS ) tablet 2.5 mg Start: 12/22/23 1015 SCDs Start: 12/21/23 1749 Place TED hose Start: 12/21/23 1749 SCDs Start: 12/21/23 0150 Code Status:   Code Status: Limited: Do not attempt resuscitation (DNR) -DNR-LIMITED -Do Not Intubate/DNI  Family Communication: plan of care discussed with patient at bedside. Patient status is: Remains hospitalized because of severity of illness Level of care: Med-Surg   Dispo: The patient is from: ILF            Anticipated disposition: SNF  today  Objective: Vitals last 24 hrs: Vitals:   12/23/23 0807 12/23/23 1946 12/24/23 0403 12/24/23 0750  BP: (!) 153/57 138/70 (!) 142/54 (!) 143/64  Pulse: 63 62  61   Resp:    17  Temp: 98.2 F (36.8 C) 98.2 F (36.8 C) 98.4 F (36.9 C) 97.6 F (36.4 C)  TempSrc:  Oral Oral   SpO2: 98% 97% 100% 100%  Weight:      Height:        Physical Examination: General exam: alert awake, oriented, pleasant HEENT:Oral mucosa moist, Ear/Nose WNL grossly Respiratory system: Bilaterally clear BS,no use of accessory muscle Cardiovascular system: S1 & S2 +, No JVD. Gastrointestinal system: Abdomen soft,NT,ND, BS+ Nervous System: Alert, awake, moving all extremities,and following commands. Extremities: extremities warm, leg edema NEG, LLE hip surgical site C/D/I w/ dressing Skin: Warm, no rashes MSK: Normal muscle bulk,tone, power

## 2023-12-22 NOTE — Progress Notes (Signed)
   Subjective:  89 y.o. female now POD 1 from left cephalomedullary femoral nail insertion. Patient reports that her hip feels much better. Her pain is controlled with her oral regimen.  Otherwise, no acute events overnight. No numbness or tingling to the extremity.  Objective:   VITALS:   Vitals:   12/21/23 1821 12/21/23 1950 12/22/23 0000 12/22/23 0507  BP: (!) 155/53 (!) 129/51 (!) 130/52 (!) 163/66  Pulse: 60 64 (!) 59 69  Resp: 16 18 16 19   Temp: (!) 97.4 F (36.3 C) 98.2 F (36.8 C) 97.8 F (36.6 C) 97.7 F (36.5 C)  TempSrc: Oral Oral Oral Oral  SpO2: 97% 92% 95% 100%  Weight:      Height:        Physical Exam: General: Alert, no acute distress Cardiovascular: No pedal edema Respiratory: No cyanosis, no use of accessory musculature GI: No organomegaly, abdomen is soft and non-tender Skin: No lesions in the area of chief complaint Neurologic: Sensation intact distally Psychiatric: Patient is competent for consent with normal mood and affect Lymphatic: No axillary or cervical lymphadenopathy  MUSCULOSKELETAL: Left lower extremity - Dressing clean/dry/intact - TTP peri incisionally - Leg lengths equal - Motor intact to left lower extremity - Sensation intact to light touch sural, saphenous, tibial, deep and superficial peroneal nerve distributions - 2+ DP pulse  Lab Results  Component Value Date   WBC 5.8 12/22/2023   HGB 8.5 (L) 12/22/2023   HCT 26.8 (L) 12/22/2023   MCV 87.3 12/22/2023   PLT 138 (L) 12/22/2023   BMET    Component Value Date/Time   NA 136 12/22/2023 0600   NA 142 01/27/2023 1244   K 4.2 12/22/2023 0600   CL 103 12/22/2023 0600   CO2 23 12/22/2023 0600   GLUCOSE 137 (H) 12/22/2023 0600   BUN 28 (H) 12/22/2023 0600   BUN 12 01/27/2023 1244   CREATININE 1.37 (H) 12/22/2023 0600   CALCIUM  9.4 12/22/2023 0600   EGFR 45 (L) 01/27/2023 1244   GFRNONAA 35 (L) 12/22/2023 0600     Assessment/Plan: 89 y.o. female  now 1 Day Post-Op from  Principal Problem:   Closed intertrochanteric fracture of hip, left, initial encounter (HCC) .  Patient is doing well.  She may get up with physical therapy today and restart her eliquis . Will continue to trend CBC to monitor hgb.  Transfusion goals if necessary, hgb > 7  Weight bearing: WBAT LLE Pain control: hydrocodone  PT/OT DVT ppx: ok to restart eliquis  Bowel regimen: docusate and senna Dispo: Pending PT   Rankin LELON Pizza 12/22/2023, 7:38 AM

## 2023-12-22 NOTE — TOC CAGE-AID Note (Signed)
 Transition of Care [facility]) - CAGE-AID Screening   Patient Details  Name: Stephanie Atkinson MRN: [id number] Date of Birth: 1924/08/16  Transition of [facility]) CM/SW Contact:    LEBRON ROCKIE ORN, RN Phone Number: [phone] 12/22/2023, 8:19 AM   Clinical Narrative: Pt denies drug or alcohol  use.  Screening complete.    CAGE-AID Screening:    Have You Ever Felt You Ought to Cut Down on Your Drinking or Drug Use?: No Have People Annoyed You By Critizing Your Drinking Or Drug Use?: No Have You Felt Bad Or Guilty About Your Drinking Or Drug Use?: No Have You Ever Had a Drink or Used Drugs First Thing In The Morning to Steady Your Nerves or to Get Rid of a Hangover?: No CAGE-AID Score: 0  Substance Abuse Education Offered: No

## 2023-12-23 DIAGNOSIS — S72142A Displaced intertrochanteric fracture of left femur, initial encounter for closed fracture: Secondary | ICD-10-CM | POA: Diagnosis not present

## 2023-12-23 LAB — BASIC METABOLIC PANEL WITH GFR
Anion gap: 9 (ref 5–15)
BUN: 36 mg/dL — ABNORMAL HIGH (ref 8–23)
CO2: 23 mmol/L (ref 22–32)
Calcium: 9.6 mg/dL (ref 8.9–10.3)
Chloride: 106 mmol/L (ref 98–111)
Creatinine, Ser: 1.28 mg/dL — ABNORMAL HIGH (ref 0.44–1.00)
GFR, Estimated: 38 mL/min — ABNORMAL LOW (ref >60)
Glucose, Bld: 112 mg/dL — ABNORMAL HIGH (ref 70–99)
Potassium: 4.8 mmol/L (ref 3.5–5.1)
Sodium: 138 mmol/L (ref 135–145)

## 2023-12-23 LAB — CBC
HCT: 24.9 % — ABNORMAL LOW (ref 36.0–46.0)
Hemoglobin: 8.2 g/dL — ABNORMAL LOW (ref 12.0–15.0)
MCH: 28.4 pg (ref 26.0–34.0)
MCHC: 32.9 g/dL (ref 30.0–36.0)
MCV: 86.2 fL (ref 80.0–100.0)
Platelets: 163 K/uL (ref 150–400)
RBC: 2.89 MIL/uL — ABNORMAL LOW (ref 3.87–5.11)
RDW: 13.3 % (ref 11.5–15.5)
WBC: 6.6 K/uL (ref 4.0–10.5)
nRBC: 0 % (ref 0.0–0.2)

## 2023-12-23 MED ORDER — METHOCARBAMOL 500 MG PO TABS: 500.0000 mg | ORAL_TABLET | Freq: Four times a day (QID) | ORAL | Status: AC | PRN

## 2023-12-23 MED ORDER — ACETAMINOPHEN 325 MG PO TABS
650.0000 mg | ORAL_TABLET | Freq: Four times a day (QID) | ORAL | Status: DC | PRN
  Administered 2023-12-23: 650 mg via ORAL
  Filled 2023-12-23: qty 2

## 2023-12-23 MED ORDER — OXYCODONE HCL 5 MG PO TABS: 5.0000 mg | ORAL_TABLET | Freq: Four times a day (QID) | ORAL | 0 refills | Status: AC | PRN

## 2023-12-23 NOTE — Progress Notes (Signed)
 PROGRESS NOTE Stephanie Atkinson  FMW:[id number] DOB: 01/25/1925 DOA: 12/20/2023 PCP: [facility]  Brief Narrative/Hospital Course: Stephanie Atkinson is a 89 y.o. female with PMH of of CAD s/p CABG '06, HFrEF, Afib on AC, CKD3b, HTN, HLD, Hypothyroidism, who presented after a ground level fall when she stood up she fell onto her left side heard a crack in her left hip + sudden pain. Also c/o pain in the left shoulder and knee.  She lives alone in ILF and is independent will all ADLs and most IADLs, has someone come help with her laundry. She remains active and goes on walks, does chores around the house, up and down stairs without issue. No exertional chest pain. She takes eliquis  BID In the ED found to have left femoral intertrochanteric fracture orthopedic was consulted and patient was admitted Labs showed creatinine of 1.4, hemoglobin at 11 g UA unremarkable COVID-negative RSV negative  Subjective: Seen and examined Overnight afebrile BP stable renal function 1.2 hemoglobin 8.2 from 8.5 Resting comfortably pain is controlled awaiting for PT  Assessment and plan:  Left femoral intertrochanteric fracture Ground-level fall: Preoperative assessment done on admission, holding torsemide  losartan , on home beta-blocker Therapy has been following underwent FIXATION, FRACTURE, INTERTROCHANTERIC, WITH INTRAMEDULLARY ROD (Left). Continue postop multimodal pain management DVT prophylaxis Home Eliquis  and PT OT as per orthopedics  Possible ABLA in the setting of hip fracture and surgery Anemia chronic renal disease: Hemoglobin downtrending slowly but no evidence of active bleeding.Baseline around 12 g in February, transfuse if less than 7 g Recent Labs    04/07/23 0434 04/08/23 1801 12/20/23 2034 12/20/23 2038 12/21/23 0601 12/22/23 0600 12/23/23 0518  HGB 12.1   < > 11.0* 11.9* 9.7* 8.5* 8.2*  MCV 89.0   < > 89.6  --  87.1 87.3 86.2  VITAMINB12 227  --   --   --   --   --   --   FOLATE  12.0  --   --   --   --   --   --   FERRITIN 131  --   --   --   --   --   --   TIBC 300  --   --   --   --   --   --   IRON 109  --   --   --   --   --   --    < > = values in this interval not displayed.    CAD s/p CABG: Cont Eliquis  resumed continue statin  HFrEF: ?Trace interstitial edema, diuretics held for AKI. Held losartan  preop.Continue beta-blocker per below  A-fib on AC: Cont  home Eliquis   Metoprolol  daily  CKD 3B: Baseline creatinine near 1-1.1.  Overall holding stable at 1.2-1.3 resume diuretics upon discharge Recent Labs    04/16/23 0600 04/17/23 0555 04/20/23 0658 04/21/23 0744 04/23/23 0616 09/28/23 1752 12/20/23 2034 12/20/23 2038 12/21/23 0601 12/22/23 0600 12/23/23 0518  BUN 25* 20 21 29* 21 21 25* 26* 24* 28* 36*  CREATININE 1.15* 0.98 1.14* 1.35* 0.98 1.16* 1.35* 1.40* 1.18* 1.37* 1.28*  CO2 24 21* 24 24 21* 21* 24  --  25 23 23   K 4.0 4.2 4.5 4.7 4.1 3.5 4.0 4.1 3.9 4.2 4.8    Hypertension: See HFrEF/A-fib.  Vital signs are stable.  Hyperlipidemia: Continue home atorvastatin .    Hypothyroidism: Continue home levothyroxine .  Chronic pain: Normally on tramadol , see acute pain management above   Mobility: PT  Orders: Active  PT Follow up Rec:     DVT prophylaxis: apixaban  (ELIQUIS ) tablet 2.5 mg Start: 12/22/23 1015 SCDs Start: 12/21/23 1749 Place TED hose Start: 12/21/23 1749 SCDs Start: 12/21/23 0150 Code Status:   Code Status: Limited: Do not attempt resuscitation (DNR) -DNR-LIMITED -Do Not Intubate/DNI  Family Communication: plan of care discussed with patient at bedside. Patient status is: Remains hospitalized because of severity of illness Level of care: Med-Surg   Dispo: The patient is from: ILF            Anticipated disposition: Likely SNF once available pending PT OT input  Objective: Vitals last 24 hrs: Vitals:   12/22/23 0838 12/22/23 1432 12/22/23 1900 12/23/23 0807  BP: 135/71 (!) 124/50 (!) 156/55 (!) 153/57   Pulse: (!) 58 60 71 63  Resp: 16 15 18    Temp: 98.4 F (36.9 C) 97.9 F (36.6 C) 98 F (36.7 C) 98.2 F (36.8 C)  TempSrc: Oral Oral Oral   SpO2: 98% 94% 99% 98%  Weight:      Height:        Physical Examination: General exam: alert awake, oriented, HEENT:Oral mucosa moist, Ear/Nose WNL grossly Respiratory system: Bilaterally clear BS,no use of accessory muscle Cardiovascular system: S1 & S2 +, No JVD. Gastrointestinal system: Abdomen soft,NT,ND, BS+ Nervous System: Alert, awake, moving all extremities,and following commands. Extremities: extremities warm, leg edema NEG, LLE w/ surgical site C/D/I Skin: Warm, no rashes MSK: Normal muscle bulk,tone, power   Medications reviewed:  Scheduled Meds:  apixaban   2.5 mg Oral BID   atorvastatin   80 mg Oral QHS   levothyroxine   50 mcg Oral Q0600   metoprolol  succinate  12.5 mg Oral Daily   polyethylene glycol  17 g Oral BID   senna  2 tablet Oral QHS   Continuous Infusions:  sodium chloride  75 mL/hr at 12/21/23 1840   Diet: Diet Order             Diet regular Room service appropriate? Yes; Fluid consistency: Thin  Diet effective now                    Data Reviewed: I have personally reviewed following labs and imaging studies ( see epic result tab) CBC: Recent Labs  Lab 12/20/23 2034 12/20/23 2038 12/21/23 0601 12/22/23 0600 12/23/23 0518  WBC 7.5  --  6.5 5.8 6.6  HGB 11.0* 11.9* 9.7* 8.5* 8.2*  HCT 34.6* 35.0* 30.5* 26.8* 24.9*  MCV 89.6  --  87.1 87.3 86.2  PLT 159  --  149* 138* 163   CMP: Recent Labs  Lab 12/20/23 2034 12/20/23 2038 12/21/23 0601 12/22/23 0600 12/23/23 0518  NA 134* 137 138 136 138  K 4.0 4.1 3.9 4.2 4.8  CL 99 101 103 103 106  CO2 24  --  25 23 23   GLUCOSE 101* 99 100* 137* 112*  BUN 25* 26* 24* 28* 36*  CREATININE 1.35* 1.40* 1.18* 1.37* 1.28*  CALCIUM  9.7  --  9.8 9.4 9.6  MG  --   --  2.2 2.2  --   PHOS  --   --  3.0 2.9  --    GFR: Estimated Creatinine Clearance:  19.9 mL/min (A) (by C-G formula based on SCr of 1.28 mg/dL (H)). Recent Labs  Lab 12/20/23 2034  AST 50*  ALT 33  ALKPHOS 99  BILITOT 1.8*  PROT 6.7  ALBUMIN 3.4*   No results for input(s): LIPASE, AMYLASE in the  last 168 hours. No results for input(s): AMMONIA in the last 168 hours. Coagulation Profile:  Recent Labs  Lab 12/20/23 2034  INR 1.5*   Unresulted Labs (From admission, onward)     Start     Ordered   12/23/23 0500  Basic metabolic panel with GFR  Daily,   R      12/22/23 0925           Antimicrobials/Microbiology: Anti-infectives (From admission, onward)    Start     Dose/Rate Route Frequency Ordered Stop   12/22/23 0600  ceFAZolin (ANCEF) IVPB 2g/100 mL premix        2 g 200 mL/hr over 30 Minutes Intravenous On call to O.R. 12/21/23 1353 12/21/23 1606   12/21/23 2200  ceFAZolin (ANCEF) IVPB 2g/100 mL premix        2 g 200 mL/hr over 30 Minutes Intravenous Every 6 hours 12/21/23 1748 12/22/23 0448         Component Value Date/Time   SDES URINE, CLEAN CATCH 09/28/2023 2206   SPECREQUEST NONE 09/28/2023 2206   CULT (A) 09/28/2023 2206    <10,000 COLONIES/mL INSIGNIFICANT GROWTH Performed at [facility], [address] [place], [place] [address]    REPTSTATUS 09/29/2023 FINAL 09/28/2023 2206    Procedures: Procedure(s) (LRB): FIXATION, FRACTURE, INTERTROCHANTERIC, WITH INTRAMEDULLARY ROD (Left)   Mennie LAMY, MD [facility] 12/23/2023, 10:24 AM

## 2023-12-23 NOTE — Evaluation (Signed)
 Physical Therapy Evaluation Patient Details Name: KAMORIA LUCIEN MRN: [id number] DOB: 02-14-1925 Today's Date: 12/23/2023  History of Present Illness  Pt is a 89 y.o. female admitted on 12/20/23 with ground level fall onto L side sustaining L femoral intertrochanteric fx, underwent placement of IM rod 10/26. PMH: HTN, HLD, CAD s/p CABG, paroxysmal atrial fibrillation, hypothyroidism, CKD stage 3B, MI, syncope, chronic HFrEF  Clinical Impression  Pt admitted with above diagnosis. Pt from home in an apt where she lives alone and does light cooking and cleaning. Daughter brings her groceries and she has someone that helps with laundry and vacuuming. Pt has had other falls. Daughter reports this time she was turning and LLE went out under her and she fell. Pt tolerating LLE discomfort very well on eval and was able to stand and pivot to recliner with RW and mod A +2. Is having difficulty stepping RLE due to pain with wt shifting to L but expect her to progress well with this. Patient will benefit from continued inpatient follow up therapy, <3 hours/day.  Pt currently with functional limitations due to the deficits listed below (see PT Problem List). Pt will benefit from acute skilled PT to increase their independence and safety with mobility to allow discharge.           If plan is discharge home, recommend the following: A lot of help with walking and/or transfers;A lot of help with bathing/dressing/bathroom;Assistance with cooking/housework;Assist for transportation   Can travel by private vehicle   No    Equipment Recommendations Rollator (4 wheels) (pt's rollator is broken)  Recommendations for Other Services  OT consult    Functional Status Assessment Patient has had a recent decline in their functional status and demonstrates the ability to make significant improvements in function in a reasonable and predictable amount of time.     Precautions / Restrictions Precautions Precautions:  Fall Restrictions Weight Bearing Restrictions Per Provider Order: Yes LLE Weight Bearing Per Provider Order: Weight bearing as tolerated      Mobility  Bed Mobility Overal bed mobility: Needs Assistance Bed Mobility: Supine to Sit     Supine to sit: Mod assist     General bed mobility comments: mod A for LE's off EOB and Mod HHA to elevate trunk to sitting. Pt able to strighten hips independently once EOB    Transfers Overall transfer level: Needs assistance Equipment used: Rolling walker (2 wheels) Transfers: Sit to/from Stand, Bed to chair/wheelchair/BSC Sit to Stand: Min assist, +2 physical assistance   Step pivot transfers: +2 physical assistance, Mod assist       General transfer comment: min A to steady during power up. Pt able to step LLE but had great difficulty stepping RLE due to pain with wt shifting to L    Ambulation/Gait       Gait Pattern/deviations: Step-to pattern Gait velocity: decreased Gait velocity interpretation: <1.31 ft/sec, indicative of household ambulator   General Gait Details: minor buckling L knee when stepping R foot. Mod A for support and vc's for pushing down through RW though pt with arthritis B hands  Stairs            Wheelchair Mobility     Tilt Bed    Modified Rankin (Stroke Patients Only)       Balance Overall balance assessment: Needs assistance, History of Falls Sitting-balance support: Feet supported, Bilateral upper extremity supported Sitting balance-Leahy Scale: Fair     Standing balance support: Bilateral upper extremity supported, During  functional activity, Reliant on assistive device for balance Standing balance-Leahy Scale: Poor                               Pertinent Vitals/Pain Pain Assessment Pain Assessment: Faces Faces Pain Scale: Hurts even more Pain Location: L hip Pain Descriptors / Indicators: Aching, Sore, Operative site guarding Pain Intervention(s): Limited activity  within patient's tolerance, Monitored during session    Home Living Family/patient expects to be discharged to:: Private residence Living Arrangements: Alone Available Help at Discharge: Family;Available PRN/intermittently Type of Home: Apartment Home Access: Elevator       Home Layout: One level Home Equipment: Grab bars - tub/shower;Grab bars - toilet;Shower seat;Cane - single point;Rollator (4 wheels);Cane - Programmer, Applications (2 wheels);Adaptive equipment Additional Comments: walk in shower, normal height toilet; dtr lives in [place] and calls pt every day and comes to see her 2-3 times a week    Prior Function Prior Level of Function : Needs assist             Mobility Comments: rollator for mobility, has had several falls ADLs Comments: daughter brings groceries, no longer drives, does cook and do light cleaning, assist for washing clothes     Extremity/Trunk Assessment   Upper Extremity Assessment Upper Extremity Assessment: Defer to OT evaluation    Lower Extremity Assessment Lower Extremity Assessment: LLE deficits/detail LLE Deficits / Details: hip flex 2/5, ankle WFL, knee ext 3-/5, able to tolerate 90 deg assisted knee flexion LLE Sensation: WNL LLE Coordination: decreased gross motor    Cervical / Trunk Assessment Cervical / Trunk Assessment: Normal  Communication   Communication Communication: No apparent difficulties    Cognition Arousal: Alert Behavior During Therapy: WFL for tasks assessed/performed                           PT - Cognition Comments: occasionally needs questions repeated but very quick for 89 yo ! Following commands: Intact       Cueing Cueing Techniques: Verbal cues     General Comments General comments (skin integrity, edema, etc.): daughter present for education    Exercises General Exercises - Lower Extremity Ankle Circles/Pumps: AROM, 10 reps, Both, Seated Quad Sets: AROM, Both, 10 reps, Seated    Assessment/Plan    PT Assessment Patient needs continued PT services  PT Problem List Decreased strength;Decreased activity tolerance;Decreased range of motion;Decreased balance;Decreased mobility;Pain       PT Treatment Interventions DME instruction;Gait training;Functional mobility training;Therapeutic activities;Therapeutic exercise;Balance training;Patient/family education;Neuromuscular re-education    PT Goals (Current goals can be found in the Care Plan section)  Acute Rehab PT Goals Patient Stated Goal: return home PT Goal Formulation: With patient Time For Goal Achievement: 01/06/24 Potential to Achieve Goals: Good    Frequency Min 2X/week     Co-evaluation               AM-PAC PT 6 Clicks Mobility  Outcome Measure Help needed turning from your back to your side while in a flat bed without using bedrails?: A Little Help needed moving from lying on your back to sitting on the side of a flat bed without using bedrails?: A Lot Help needed moving to and from a bed to a chair (including a wheelchair)?: A Lot Help needed standing up from a chair using your arms (e.g., wheelchair or bedside chair)?: Total Help needed to walk in hospital room?:  Total Help needed climbing 3-5 steps with a railing? : Total 6 Click Score: 10    End of Session Equipment Utilized During Treatment: Gait belt Activity Tolerance: Patient tolerated treatment well Patient left: in chair;with call bell/phone within reach;with chair alarm set;with family/visitor present Nurse Communication: Mobility status PT Visit Diagnosis: History of falling (Z91.81);Pain;Difficulty in walking, not elsewhere classified (R26.2);Unsteadiness on feet (R26.81) Pain - Right/Left: Left Pain - part of body: Hip    Time: 8965-8890 PT Time Calculation (min) (ACUTE ONLY): 35 min   Charges:   PT Evaluation $PT Eval Moderate Complexity: 1 Mod PT Treatments $Therapeutic Activity: 8-22 mins PT General  Charges $$ ACUTE PT VISIT: 1 Visit         Richerd Lipoma, PT  Acute Rehab Services Secure chat preferred Office [phone]   Richerd CROME Marsalis Beaulieu 12/23/2023, 1:29 PM

## 2023-12-23 NOTE — Progress Notes (Signed)
   Subjective:  89 y.o. female now POD 2 from left cephalomedullary nail insertion. Patient reports that she is feeling well and continues to work with PT  Otherwise, no acute events overnight. No numbness or tingling to the extremity.  Objective:   VITALS:   Vitals:   12/22/23 0838 12/22/23 1432 12/22/23 1900 12/23/23 0807  BP: 135/71 (!) 124/50 (!) 156/55 (!) 153/57  Pulse: (!) 58 60 71 63  Resp: 16 15 18    Temp: 98.4 F (36.9 C) 97.9 F (36.6 C) 98 F (36.7 C) 98.2 F (36.8 C)  TempSrc: Oral Oral Oral   SpO2: 98% 94% 99% 98%  Weight:      Height:        Physical Exam: General: Alert, no acute distress Cardiovascular: No pedal edema Respiratory: No cyanosis, no use of accessory musculature GI: No organomegaly, abdomen is soft and non-tender Skin: No lesions in the area of chief complaint Neurologic: Sensation intact distally Psychiatric: Patient is competent for consent with normal mood and affect Lymphatic: No axillary or cervical lymphadenopathy  MUSCULOSKELETAL: left lower extremity - Dressing c/d/i - TTP peri incisionally - Motor intact to lower extremity - Leg lengths equal - Sensation intact to light touch sural, saphenous, tibial, superficial and deep peroneal nerve distributions - 2+ DP pulse  Lab Results  Component Value Date   WBC 6.6 12/23/2023   HGB 8.2 (L) 12/23/2023   HCT 24.9 (L) 12/23/2023   MCV 86.2 12/23/2023   PLT 163 12/23/2023   BMET    Component Value Date/Time   NA 138 12/23/2023 0518   NA 142 01/27/2023 1244   K 4.8 12/23/2023 0518   CL 106 12/23/2023 0518   CO2 23 12/23/2023 0518   GLUCOSE 112 (H) 12/23/2023 0518   BUN 36 (H) 12/23/2023 0518   BUN 12 01/27/2023 1244   CREATININE 1.28 (H) 12/23/2023 0518   CALCIUM  9.6 12/23/2023 0518   EGFR 45 (L) 01/27/2023 1244   GFRNONAA 38 (L) 12/23/2023 0518     Assessment/Plan: 89 y.o. female  now 2 Days Post-Op from Principal Problem:   Closed intertrochanteric fracture of hip,  left, initial encounter (HCC) .  Patient is doing well.  She will require further PT for gait training and strengthening  Weight bearing: as tolerated left lower extremity Pain control: oxy PT/OT DVT ppx: eliquis  Bowel regimen: docusate Dispo: pending placement at [facility]   [place] ORN Kayren Holck 12/23/2023, 5:02 PM

## 2023-12-23 NOTE — TOC Initial Note (Signed)
 Transition of Care [facility]) - Initial/Assessment Note    Patient Details  Name: Stephanie Atkinson MRN: [id number] Date of Birth: 1924/03/21  Transition of Care [facility]) CM/SW Contact:    Bridget Cordella Simmonds, LCSW Phone Number: 12/23/2023, 3:33 PM  Clinical Narrative:      CSW met with pt regarding PT recommendation for SNF.  Pt in agreement with SNF, requesting [facility].  Pt from home alone, has cleaning person come once per week, no other services.  Permission given to speak with daughter  [name].  Permission given to send out referral in the hub.  CSW spoke with daughter [name] by phone, she is also in agreement with plan for SNF at [facility] if bed is available.  Referral sent out in hub for SNF, CSW reached out to [facility] to review.          Expected Discharge Plan: Skilled Nursing Facility Barriers to Discharge: Continued Medical Work up, SNF Pending bed offer   Patient Goals and CMS Choice Patient states their goals for this hospitalization and ongoing recovery are:: be like I was   Choice offered to / list presented to : Patient, Adult Children (pt requesting [facility], daughter [name] in agreement)      Expected Discharge Plan and Services In-house Referral: Clinical Social Work   Post Acute Care Choice: Skilled Nursing Facility Living arrangements for the past 2 months: Apartment                                      Prior Living Arrangements/Services Living arrangements for the past 2 months: Apartment Lives with:: Self Patient language and need for interpreter reviewed:: Yes Do you feel safe going back to the place where you live?: Yes      Need for Family Participation in Patient Care: Yes (Comment) Care giver support system in place?: Yes (comment) Current home services: Homehealth aide (once per week for cleaning) Criminal Activity/Legal Involvement Pertinent to Current Situation/Hospitalization: No - Comment as needed  Activities of Daily Living       Permission Sought/Granted Permission sought to share information with : Family Supports Permission granted to share information with : Yes, Verbal Permission Granted  Share Information with NAME: daughter [name]  Permission granted to share info w AGENCY: SNF        Emotional Assessment Appearance:: Appears stated age Attitude/Demeanor/Rapport: Engaged Affect (typically observed): Appropriate, Pleasant Orientation: : Oriented to Self, Oriented to Place, Oriented to  Time, Oriented to Situation      Admission diagnosis:  Closed left hip fracture, initial encounter (HCC) [S72.002A] Fall in home, initial encounter [W19.XXXA, Y92.009] Closed intertrochanteric fracture of hip, left, initial encounter [facility]) [S72.142A] Patient Active Problem List   Diagnosis Date Noted   Closed intertrochanteric fracture of hip, left, initial encounter (HCC) 12/20/2023   Protein-calorie malnutrition, severe 04/24/2023   Acute pulmonary edema (HCC) 04/14/2023   Generalized weakness 04/08/2023   Hyponatremia 04/07/2023   Acute anemia 04/07/2023   Acute systolic heart failure (HCC) 04/06/2023   AMS (altered mental status) 03/08/2023   Hypotension 03/08/2023   Sepsis (HCC) 03/08/2023   UTI (urinary tract infection) 03/08/2023   CKD stage 3b, GFR 30-44 ml/min (HCC) 01/09/2023   Near syncope 01/08/2023   Elevated troponin 01/08/2023   Hypercalcemia 01/08/2023   Acquired hypothyroidism 01/08/2023   Low back pain 06/14/2022   CHF (congestive heart failure) (HCC) 12/11/2021   Acute on chronic systolic  CHF (congestive heart failure) (HCC) 12/09/2021   Venous stasis dermatitis of both lower extremities 01/06/2020   Unilateral primary osteoarthritis, right knee 01/06/2020   Community acquired bacterial pneumonia 06/23/2019   AKI (acute kidney injury) 06/23/2019   Synovitis of right knee 03/02/2019   Chronic diastolic heart failure (HCC) 09/14/2018   Pacemaker 07/16/2017   Paroxysmal atrial  fibrillation (HCC) 06/27/2017   HLD (hyperlipidemia) 06/27/2017   Symptomatic bradycardia s/p PPM  06/27/2017   Chronic pain of right knee 05/17/2016   De Quervain's tenosynovitis 05/17/2016   CAD (coronary artery disease) of artery bypass graft 02/13/2015   Encounter for loop recorder check 02/01/2014   Accelerated junctional rhythm 05/04/2013   Dehydration 10/14/2012   Syncope and collapse 10/13/2012   History of first degree atrioventricular block 10/11/2012   Long term (current) use of anticoagulants 10/06/2012   AVB - beta blocker and Amiodarone stopped 07/10/2012   Essential hypertension    Hx of CABG X 4 3/06-     Dyslipidemia    RCA DES placed 7/12- patent 8/14    Permanent atrial fibrillation (HCC)    Acute kidney injury superimposed on chronic kidney disease    PCP:  Financial Risk Analyst, [facility] Pharm[facility]:   [facility] [phone] [address], [place] - [address] [place] [address] Phone: [phone] Fax: [phone]  [facility] [address] [place] [address] Phone: [phone] Fax: [phone]     Social Drivers of Health (SDOH) Social History: SDOH Screenings   Food Insecurity: No Food Insecurity (12/23/2023)  Housing: Low Risk  (12/23/2023)  Transportation Needs: No Transportation Needs (12/23/2023)  Utilities: Not At Risk (12/23/2023)  Financial Resource Strain: Low Risk  (03/04/2023)  Social Connections: Socially Isolated (12/23/2023)  Tobacco Use: High Risk (12/21/2023)   SDOH Interventions:     Readmission Risk Interventions    03/11/2023   10:35 AM  Readmission Risk Prevention Plan  Transportation Screening Complete  PCP or Specialist Appt within 5-7 Days Complete  Home Care Screening Complete  Medication Review (RN CM) Complete

## 2023-12-23 NOTE — NC FL2 (Signed)
 [place]  MEDICAID FL2 LEVEL OF CARE FORM     IDENTIFICATION  Patient Name: Stephanie Atkinson Birthdate: 1924/07/04 Sex: female Admission Date (Current Location): 12/20/2023  [facility] and [place] Number:  Producer, Television/film/video and Address:  [facility], [address], [place], [place] [address]      Provider Number: [id number]  Attending Physician Name and Address:  Christobal Guadalajara, MD  Relative Name and Phone Number:  Rashid,Judy Daughter [phone]    Current Level of Care: Hospital Recommended Level of Care: Skilled Nursing Facility Prior Approval Number:    Date Approved/Denied:   PASRR Number: [id number]  Discharge Plan: SNF    Current Diagnoses: Patient Active Problem List   Diagnosis Date Noted   Closed intertrochanteric fracture of hip, left, initial encounter (HCC) 12/20/2023   Protein-calorie malnutrition, severe 04/24/2023   Acute pulmonary edema (HCC) 04/14/2023   Generalized weakness 04/08/2023   Hyponatremia 04/07/2023   Acute anemia 04/07/2023   Acute systolic heart failure (HCC) 04/06/2023   AMS (altered mental status) 03/08/2023   Hypotension 03/08/2023   Sepsis (HCC) 03/08/2023   UTI (urinary tract infection) 03/08/2023   CKD stage 3b, GFR 30-44 ml/min (HCC) 01/09/2023   Near syncope 01/08/2023   Elevated troponin 01/08/2023   Hypercalcemia 01/08/2023   Acquired hypothyroidism 01/08/2023   Low back pain 06/14/2022   CHF (congestive heart failure) (HCC) 12/11/2021   Acute on chronic systolic CHF (congestive heart failure) (HCC) 12/09/2021   Venous stasis dermatitis of both lower extremities 01/06/2020   Unilateral primary osteoarthritis, right knee 01/06/2020   Community acquired bacterial pneumonia 06/23/2019   AKI (acute kidney injury) 06/23/2019   Synovitis of right knee 03/02/2019   Chronic diastolic heart failure (HCC) 09/14/2018   Pacemaker 07/16/2017   Paroxysmal atrial fibrillation (HCC) 06/27/2017   HLD  (hyperlipidemia) 06/27/2017   Symptomatic bradycardia s/p PPM  06/27/2017   Chronic pain of right knee 05/17/2016   De Quervain's tenosynovitis 05/17/2016   CAD (coronary artery disease) of artery bypass graft 02/13/2015   Encounter for loop recorder check 02/01/2014   Accelerated junctional rhythm 05/04/2013   Dehydration 10/14/2012   Syncope and collapse 10/13/2012   History of first degree atrioventricular block 10/11/2012   Long term (current) use of anticoagulants 10/06/2012   AVB - beta blocker and Amiodarone stopped 07/10/2012   Essential hypertension    Hx of CABG X 4 3/06-     Dyslipidemia    RCA DES placed 7/12- patent 8/14    Permanent atrial fibrillation (HCC)    Acute kidney injury superimposed on chronic kidney disease     Orientation RESPIRATION BLADDER Height & Weight     Self, Time, Situation, Place  Normal Incontinent Weight: 127 lb (57.6 kg) Height:  5' 2.01 (157.5 cm)  BEHAVIORAL SYMPTOMS/MOOD NEUROLOGICAL BOWEL NUTRITION STATUS        Diet (see discharge summary)  AMBULATORY STATUS COMMUNICATION OF NEEDS Skin   Extensive Assist Verbally Surgical wounds                       Personal Care Assistance Level of Assistance  Bathing, Feeding, Dressing Bathing Assistance: Limited assistance Feeding assistance: Limited assistance Dressing Assistance: Limited assistance     Functional Limitations Info  Sight, Hearing, Speech Sight Info: Adequate Hearing Info: Adequate Speech Info: Adequate    SPECIAL CARE FACTORS FREQUENCY  PT (By licensed PT), OT (By licensed OT)     PT Frequency: 5x week OT  Frequency: 5x week            Contractures Contractures Info: Not present    Additional Factors Info  Code Status, Allergies Code Status Info: DNR Allergies Info: Penicillins, Sulfa Antibiotics, Gabapentin , Lyrica (Pregabalin)           Current Medications (12/23/2023):  This is the current hospital active medication list Current  Facility-Administered Medications  Medication Dose Route Frequency Provider Last Rate Last Admin   acetaminophen  (TYLENOL ) tablet 650 mg  650 mg Oral Q6H PRN Kc, Mennie, MD   650 mg at 12/23/23 1126   alum & mag hydroxide-simeth (MAALOX/MYLANTA) 200-200-20 MG/5ML suspension 30 mL  30 mL Oral Q4H PRN Patti Rosina SAUNDERS, PA-C       apixaban  (ELIQUIS ) tablet 2.5 mg  2.5 mg Oral BID Kc, Ramesh, MD   2.5 mg at 12/23/23 1052   atorvastatin  (LIPITOR ) tablet 80 mg  80 mg Oral QHS Patti Rosina SAUNDERS, PA-C   80 mg at 12/22/23 2118   bisacodyl  (DULCOLAX) suppository 10 mg  10 mg Rectal Daily PRN Patti Rosina SAUNDERS, PA-C       diphenhydrAMINE  (BENADRYL ) 12.5 MG/5ML elixir 12.5-25 mg  12.5-25 mg Oral Q4H PRN Patti Rosina SAUNDERS, PA-C       HYDROmorphone  (DILAUDID ) injection 0.5-1 mg  0.5-1 mg Intravenous Q4H PRN Patti Rosina SAUNDERS, PA-C       levothyroxine  (SYNTHROID ) tablet 50 mcg  50 mcg Oral Q0600 Patti Rosina SAUNDERS, PA-C   50 mcg at 12/23/23 9350   menthol  (CEPACOL) lozenge 3 mg  1 lozenge Oral PRN Patti Rosina SAUNDERS, PA-C       Or   phenol (CHLORASEPTIC) mouth spray 1 spray  1 spray Mouth/Throat PRN Patti Rosina SAUNDERS, PA-C       methocarbamol  (ROBAXIN ) tablet 500 mg  500 mg Oral Q6H PRN Patti Rosina R, PA-C   500 mg at 12/22/23 1642   Or   methocarbamol  (ROBAXIN ) injection 500 mg  500 mg Intravenous Q6H PRN Patti Rosina SAUNDERS, PA-C       metoCLOPramide (REGLAN) tablet 5-10 mg  5-10 mg Oral Q8H PRN Patti Rosina SAUNDERS, PA-C       Or   metoCLOPramide (REGLAN) injection 5-10 mg  5-10 mg Intravenous Q8H PRN Patti Rosina SAUNDERS, PA-C       metoprolol  succinate (TOPROL -XL) 24 hr tablet 12.5 mg  12.5 mg Oral Daily Patti Rosina SAUNDERS, PA-C   12.5 mg at 12/23/23 1052   ondansetron  (ZOFRAN ) tablet 4 mg  4 mg Oral Q6H PRN Patti Rosina SAUNDERS, PA-C       Or   ondansetron  (ZOFRAN ) injection 4 mg  4 mg Intravenous Q6H PRN Patti Rosina SAUNDERS, PA-C       oxyCODONE  (Oxy IR/ROXICODONE ) immediate release tablet 10 mg  10 mg Oral Q4H  PRN Patti Rosina R, PA-C   10 mg at 12/22/23 2118   oxyCODONE  (Oxy IR/ROXICODONE ) immediate release tablet 5 mg  5 mg Oral Q4H PRN Patti Rosina R, PA-C   5 mg at 12/23/23 1514   polyethylene glycol (MIRALAX  / GLYCOLAX ) packet 17 g  17 g Oral BID Patti Rosina SAUNDERS, PA-C   17 g at 12/23/23 1052   senna (SENOKOT) tablet 17.2 mg  2 tablet Oral QHS Patti Rosina SAUNDERS, PA-C   17.2 mg at 12/22/23 2118     Discharge Medications: Please see discharge summary for a list of discharge medications.  Relevant Imaging Results:  Relevant Lab Results:   Additional Information SS#:  [id number]  Bridget Cordella Simmonds, LCSW

## 2023-12-24 ENCOUNTER — Encounter (HOSPITAL_COMMUNITY): Payer: Self-pay

## 2023-12-24 DIAGNOSIS — S72142A Displaced intertrochanteric fracture of left femur, initial encounter for closed fracture: Secondary | ICD-10-CM | POA: Diagnosis not present

## 2023-12-24 LAB — BASIC METABOLIC PANEL WITH GFR
Anion gap: 7 (ref 5–15)
BUN: 40 mg/dL — ABNORMAL HIGH (ref 8–23)
CO2: 24 mmol/L (ref 22–32)
Calcium: 9.7 mg/dL (ref 8.9–10.3)
Chloride: 106 mmol/L (ref 98–111)
Creatinine, Ser: 1.21 mg/dL — ABNORMAL HIGH (ref 0.44–1.00)
GFR, Estimated: 41 mL/min — ABNORMAL LOW (ref >60)
Glucose, Bld: 88 mg/dL (ref 70–99)
Potassium: 4.3 mmol/L (ref 3.5–5.1)
Sodium: 137 mmol/L (ref 135–145)

## 2023-12-24 NOTE — TOC Transition Note (Signed)
 Transition of Care [facility]) - Discharge Note   Patient Details  Name: Stephanie Atkinson MRN: [id number] Date of Birth: 20-Nov-1924  Transition of Care [facility]) CM/SW Contact:  Bridget Cordella Simmonds, LCSW Phone Number: 12/24/2023, 11:15 AM   Clinical Narrative:   Pt discharging to [facility], room 603B.  RN call report to [phone].  PTAR called [phone].     Final next level of care: Skilled Nursing Facility Barriers to Discharge: Barriers Resolved   Patient Goals and CMS Choice Patient states their goals for this hospitalization and ongoing recovery are:: be like I was   Choice offered to / list presented to : Patient, Adult Children (pt requesting [facility], daughter [name] in agreement)      Discharge Placement              Patient chooses bed at: [facility] Patient to be transferred to facility by: PTAR Name of family member notified: daughter [name] in room Patient and family notified of of transfer: 12/24/23  Discharge Plan and Services Additional resources added to the After Visit Summary for   In-house Referral: Clinical Social Work   Post Acute Care Choice: Skilled Nursing Facility                               Social Drivers of Health (SDOH) Interventions SDOH Screenings   Food Insecurity: No Food Insecurity (12/23/2023)  Housing: Low Risk  (12/23/2023)  Transportation Needs: No Transportation Needs (12/23/2023)  Utilities: Not At Risk (12/23/2023)  Financial Resource Strain: Low Risk  (03/04/2023)  Social Connections: Socially Isolated (12/23/2023)  Tobacco Use: High Risk (12/21/2023)     Readmission Risk Interventions    03/11/2023   10:35 AM  Readmission Risk Prevention Plan  Transportation Screening Complete  PCP or Specialist Appt within 5-7 Days Complete  Home Care Screening Complete  Medication Review (RN CM) Complete

## 2023-12-24 NOTE — TOC Progression Note (Signed)
 Transition of Care [facility]) - Progression Note    Patient Details  Name: KYNZI LEVAY MRN: [id number] Date of Birth: Sep 29, 1924  Transition [facility]) CM/SW Contact  Bridget Cordella Simmonds, LCSW Phone Number: 12/24/2023, 10:12 AM  Clinical Narrative:   [facility] does offer bed, can receive pt today.  SNF auth request submitted in [place] and approved: [id number], 3 days: 10/29-10/31.  MD informed.    Expected Discharge Plan: Skilled Nursing Facility Barriers to Discharge: Continued Medical Work up, SNF Pending bed offer               Expected Discharge Plan and Services In-house Referral: Clinical Social Work   Post Acute Care Choice: Skilled Nursing Facility Living arrangements for the past 2 months: Apartment                                       Social Drivers of Health (SDOH) Interventions SDOH Screenings   Food Insecurity: No Food Insecurity (12/23/2023)  Housing: Low Risk  (12/23/2023)  Transportation Needs: No Transportation Needs (12/23/2023)  Utilities: Not At Risk (12/23/2023)  Financial Resource Strain: Low Risk  (03/04/2023)  Social Connections: Socially Isolated (12/23/2023)  Tobacco Use: High Risk (12/21/2023)    Readmission Risk Interventions    03/11/2023   10:35 AM  Readmission Risk Prevention Plan  Transportation Screening Complete  PCP or Specialist Appt within 5-7 Days Complete  Home Care Screening Complete  Medication Review (RN CM) Complete

## 2023-12-24 NOTE — Discharge Summary (Signed)
 Physician Discharge Summary  Stephanie Atkinson FMW:[id number] DOB: 1924-09-19 DOA: 12/20/2023  PCP: [facility]  Admit date: 12/20/2023 Discharge date: 12/24/2023 Recommendations for Outpatient Follow-up:  Follow up with PCP in 1 weeks-call for appointment Please obtain BMP/CBC in one week to monitor hemoglobin.  Discharge Dispo: SNF Discharge Condition: Stable Code Status:   Code Status: Limited: Do not attempt resuscitation (DNR) -DNR-LIMITED -Do Not Intubate/DNI  Diet recommendation:  Diet Order             Diet - low sodium heart healthy           Diet regular Room service appropriate? Yes; Fluid consistency: Thin  Diet effective now                    Brief/Interim Summary: Stephanie Atkinson is a 89 y.o. female with PMH of of CAD s/p CABG '06, HFrEF, Afib on AC, CKD3b, HTN, HLD, Hypothyroidism, who presented after a ground level fall when she stood up she fell onto her left side heard a crack in her left hip + sudden pain. Also c/o pain in the left shoulder and knee.  She lives alone in ILF and is independent will all ADLs and most IADLs, has someone come help with her laundry. She remains active and goes on walks, does chores around the house, up and down stairs without issue. No exertional chest pain. She takes eliquis  BID In the ED found to have left femoral intertrochanteric fracture orthopedic was consulted and patient was admitted Labs showed creatinine of 1.4, hemoglobin at 11 g UA unremarkable COVID-negative RSV negative Postoperatively remains stable, awaiting for SNF placement  Subjective: Seen and examined No new complaints Resting well oN RA Eager to go to snf today Overnight afebrile BP stable, labs this morning creatinine about the same 1.2  Discharge Diagnoses:  Left femoral intertrochanteric fracture Ground-level fall: Preoperative assessment done on admission, holding torsemide  losartan , on home beta-blocker Therapy has been following underwent  FIXATION, FRACTURE, INTERTROCHANTERIC, WITH INTRAMEDULLARY ROD (Left). Continue postop multimodal pain management DVT prophylaxis Home Eliquis  and PT OT as per orthopedics.   Possible ABLA in the setting of hip fracture and surgery Anemia chronic renal disease: Hemoglobin downtrending slowly but no evidence of active bleeding.Baseline around 12 g in February, transfuse if less than 7 g. Advised to check hb in 5-7 days Recent Labs    04/07/23 0434 04/08/23 1801 12/20/23 2034 12/20/23 2038 12/21/23 0601 12/22/23 0600 12/23/23 0518  HGB 12.1   < > 11.0* 11.9* 9.7* 8.5* 8.2*  MCV 89.0   < > 89.6  --  87.1 87.3 86.2  VITAMINB12 227  --   --   --   --   --   --   FOLATE 12.0  --   --   --   --   --   --   FERRITIN 131  --   --   --   --   --   --   TIBC 300  --   --   --   --   --   --   IRON 109  --   --   --   --   --   --    < > = values in this interval not displayed.    CAD s/p CABG: Cont Eliquis  resumed continue statin  HFrEF: ?Trace interstitial edema, diuretics held for AKI. Held losartan  preop.Continue beta-blocker per below  A-fib on AC: Cont  home Eliquis   Metoprolol  daily  CKD 3B: Baseline creatinine near 1-1.1.  Overall holding stable at 1.2-1.3 resume diuretics upon discharge Recent Labs    04/17/23 0555 04/20/23 0658 04/21/23 0744 04/23/23 0616 09/28/23 1752 12/20/23 2034 12/20/23 2038 12/21/23 0601 12/22/23 0600 12/23/23 0518 12/24/23 0406  BUN 20 21 29* 21 21 25* 26* 24* 28* 36* 40*  CREATININE 0.98 1.14* 1.35* 0.98 1.16* 1.35* 1.40* 1.18* 1.37* 1.28* 1.21*  CO2 21* 24 24 21* 21* 24  --  25 23 23 24   K 4.2 4.5 4.7 4.1 3.5 4.0 4.1 3.9 4.2 4.8 4.3    Hypertension: See HFrEF/A-fib.  Vital signs are stable.  Hyperlipidemia: Continue home atorvastatin .    Hypothyroidism: Continue home levothyroxine .  Chronic pain: Normally on tramadol , see acute pain management above   Mobility: PT Orders: Active PT Follow up Rec: Skilled Nursing-Short Term  Rehab (<3 Hours/Day)12/23/2023 1231    DVT prophylaxis: apixaban  (ELIQUIS ) tablet 2.5 mg Start: 12/22/23 1015 SCDs Start: 12/21/23 1749 Place TED hose Start: 12/21/23 1749 SCDs Start: 12/21/23 0150 Code Status:   Code Status: Limited: Do not attempt resuscitation (DNR) -DNR-LIMITED -Do Not Intubate/DNI  Family Communication: plan of care discussed with patient at bedside. Patient status is: Remains hospitalized because of severity of illness Level of care: Med-Surg   Dispo: The patient is from: ILF            Anticipated disposition: SNF  today  Objective: Vitals last 24 hrs: Vitals:   12/23/23 0807 12/23/23 1946 12/24/23 0403 12/24/23 0750  BP: (!) 153/57 138/70 (!) 142/54 (!) 143/64  Pulse: 63 62 61   Resp:    17  Temp: 98.2 F (36.8 C) 98.2 F (36.8 C) 98.4 F (36.9 C) 97.6 F (36.4 C)  TempSrc:  Oral Oral   SpO2: 98% 97% 100% 100%  Weight:      Height:        Physical Examination: General exam: alert awake, oriented, pleasant HEENT:Oral mucosa moist, Ear/Nose WNL grossly Respiratory system: Bilaterally clear BS,no use of accessory muscle Cardiovascular system: S1 & S2 +, No JVD. Gastrointestinal system: Abdomen soft,NT,ND, BS+ Nervous System: Alert, awake, moving all extremities,and following commands. Extremities: extremities warm, leg edema NEG, LLE hip surgical site C/D/I w/ dressing Skin: Warm, no rashes MSK: Normal muscle bulk,tone, power   Procedure(s) (LRB): FIXATION, FRACTURE, INTERTROCHANTERIC, WITH INTRAMEDULLARY ROD (Left)  Consultation: See note.  Discharge Instructions  Discharge Instructions     Diet - low sodium heart healthy   Complete by: As directed    Discharge instructions   Complete by: As directed    Check CBC in 1 week continue to follow-up with orthopedics for postop care  Please call call MD or return to ER for similar or worsening recurring problem that brought you to hospital or if any fever,nausea/vomiting,abdominal pain,  uncontrolled pain, chest pain,  shortness of breath or any other alarming symptoms.  Please follow-up your doctor as instructed in a week time and call the office for appointment.  Please avoid alcohol , smoking, or any other illicit substance and maintain healthy habits including taking your regular medications as prescribed.  You were cared for by a hospitalist during your hospital stay. If you have any questions about your discharge medications or the care you received while you were in the hospital after you are discharged, you can call the unit and ask to speak with the hospitalist on call if the hospitalist that took care of you is not available.  Once you are  discharged, your primary care physician will handle any further medical issues. Please note that NO REFILLS for any discharge medications will be authorized once you are discharged, as it is imperative that you return to your primary care physician (or establish a relationship with a primary care physician if you do not have one) for your aftercare needs so that they can reassess your need for medications and monitor your lab values   Discharge wound care:   Complete by: As directed    Reinforce dressing prn   Increase activity slowly   Complete by: As directed       Allergies as of 12/24/2023       Reactions   Penicillins Hives   Sulfa Antibiotics Hives, Swelling   Gabapentin  Other (See Comments)   Confusion   Lyrica [pregabalin] Other (See Comments)   Confusion        Medication List     STOP taking these medications    losartan  25 MG tablet Commonly known as: COZAAR    traMADol  50 MG tablet Commonly known as: ULTRAM        TAKE these medications    acetaminophen  325 MG tablet Commonly known as: TYLENOL  Take 650 mg by mouth in the morning and at bedtime.   apixaban  2.5 MG Tabs tablet Commonly known as: Eliquis  Take 1 tablet (2.5 mg total) by mouth 2 (two) times daily.   BIOFREEZE EX Apply 1  Application topically daily as needed (pain).   fluticasone  50 MCG/ACT nasal spray Commonly known as: FLONASE  Place 2 sprays into both nostrils daily. What changed:  when to take this reasons to take this   levothyroxine  50 MCG tablet Commonly known as: SYNTHROID  Take 50 mcg by mouth daily before breakfast.   lidocaine  5 % Commonly known as: LIDODERM  Place 1 patch onto the skin daily as needed (Pain).   Lipitor  80 MG tablet Generic drug: atorvastatin  Take 80 mg by mouth at bedtime.   loratadine  10 MG tablet Commonly known as: CLARITIN  Take 1 tablet (10 mg total) by mouth daily.   methocarbamol  500 MG tablet Commonly known as: ROBAXIN  Take 1 tablet (500 mg total) by mouth every 6 (six) hours as needed for muscle spasms.   metoprolol  succinate 25 MG 24 hr tablet Commonly known as: TOPROL -XL Take 0.5 tablets (12.5 mg total) by mouth daily.   nitrofurantoin  (macrocrystal-monohydrate) 100 MG capsule Commonly known as: MACROBID  TAKE 1 CAPSULE BY MOUTH EVERY 12 HOURS FOR 5 DAYS   oxyCODONE  5 MG immediate release tablet Commonly known as: Oxy IR/ROXICODONE  Take 1 tablet (5 mg total) by mouth every 6 (six) hours as needed for up to 4 doses for moderate pain (pain score 4-6) (pain score 4-6).   potassium chloride  10 MEQ CR capsule Commonly known as: MICRO-K  Take 10 mEq by mouth daily.   Pyridium 100 MG tablet Generic drug: phenazopyridine Take 1 tablet 3 times a day by oral route for 2 days.   SYSTANE OP Place 1 drop into both eyes daily as needed (dry eye).   torsemide  20 MG tablet Commonly known as: DEMADEX  Take 1 tablet (20 mg total) by mouth daily.               Discharge Care Instructions  (From admission, onward)           Start     Ordered   12/23/23 0000  Discharge wound care:       Comments: Reinforce dressing prn   12/23/23 1024  Contact information for follow-up providers     Teresa Rankin ORN, MD. Schedule an appointment as  soon as possible for a visit in 2 week(s).   Specialty: Orthopedic Surgery Contact information: [address]  [place] [address] [phone]         [facility] Follow up in 1 week(s).   Contact information: [address] [place] [address] [phone]              Contact information for after-discharge care     Destination     [facility] .   Service: Skilled Nursing Contact information: [address] [place]  [address] [phone]                    Allergies  Allergen Reactions   Penicillins Hives   Sulfa Antibiotics Hives and Swelling   Gabapentin  Other (See Comments)    Confusion   Lyrica [Pregabalin] Other (See Comments)    Confusion    The results of significant diagnostics from this hospitalization (including imaging, microbiology, ancillary and laboratory) are listed below for reference.    Microbiology: Recent Results (from the past 240 hours)  Resp panel by RT-PCR (RSV, Flu A&B, Covid) Anterior Nasal Swab     Status: None   Collection Time: 12/21/23  6:01 AM   Specimen: Anterior Nasal Swab  Result Value Ref Range Status   SARS Coronavirus 2 by RT PCR NEGATIVE NEGATIVE Final   Influenza A by PCR NEGATIVE NEGATIVE Final   Influenza B by PCR NEGATIVE NEGATIVE Final    Comment: (NOTE) The Xpert Xpress SARS-CoV-2/FLU/RSV plus assay is intended as an aid in the diagnosis of influenza from Nasopharyngeal swab specimens and should not be used as a sole basis for treatment. Nasal washings and aspirates are unacceptable for Xpert Xpress SARS-CoV-2/FLU/RSV testing.  Fact Sheet for Patients: bloggercourse.com  Fact Sheet for Healthcare Providers: seriousbroker.it  This test is not yet approved or cleared by the [place]  FDA and has been authorized for detection and/or diagnosis of SARS-CoV-2 by FDA under an Emergency Use  Authorization (EUA). This EUA will remain in effect (meaning this test can be used) for the duration of the COVID-19 declaration under Section 564(b)(1) of the Act, 21 U.S.C. section 360bbb-3(b)(1), unless the authorization is terminated or revoked.     Resp Syncytial Virus by PCR NEGATIVE NEGATIVE Final    Comment: (NOTE) Fact Sheet for Patients: bloggercourse.com  Fact Sheet for Healthcare Providers: seriousbroker.it  This test is not yet approved or cleared by the [place]  FDA and has been authorized for detection and/or diagnosis of SARS-CoV-2 by FDA under an Emergency Use Authorization (EUA). This EUA will remain in effect (meaning this test can be used) for the duration of the COVID-19 declaration under Section 564(b)(1) of the Act, 21 U.S.C. section 360bbb-3(b)(1), unless the authorization is terminated or revoked.  Performed at [facility], [address], [place], [place] [address]     Procedures/Studies: DG HIP UNILAT WITH PELVIS 2-3 VIEWS LEFT Result Date: 12/21/2023 EXAM: 7 SPOT INTRAOPERATIVE VIEW(S) XRAY OF THE LEFT HIP 12/21/2023 04:35:00 PM COMPARISON: None available. CLINICAL HISTORY: FINDINGS: BONES AND JOINTS: Intramedullary nail fixation of intertrochanteric femoral fracture is demonstrated. SOFT TISSUES: The soft tissues are unremarkable. IMPRESSION: 1. Intramedullary nail fixation of left intertrochanteric femoral fracture, appropriate intraoperative appearance. Electronically signed by: Norleen Boxer MD 12/21/2023 05:09 PM EDT RP Workstation: [id number]   DG C-Arm 1-60 Min-No Report Result Date: 12/21/2023 Fluoroscopy  was utilized by the requesting physician.  No radiographic interpretation.   DG C-Arm 1-60 Min-No Report Result Date: 12/21/2023 Fluoroscopy was utilized by the requesting physician.  No radiographic interpretation.   DG Shoulder Left Port Result Date: 12/21/2023 EXAM: 1 View  Xray Of The Left Shoulder 12/21/2023 02:20:44 AM COMPARISON: None available. CLINICAL HISTORY: Left shoulder pain. Rt shoulder pain post fall on thinner FINDINGS: BONES AND JOINTS: Advanced glenohumeral degenerative arthritis with remodeling of the glenoid fossa. Moderate acromioclavicular degenerative arthritis. SOFT TISSUES: Coronary artery bypass grafting in the left icd incidentally noted. No abnormal calcifications. Visualized lung is unremarkable. IMPRESSION: 1. Advanced glenohumeral degenerative arthritis with remodeling of the glenoid fossa. 2. Moderate acromioclavicular degenerative arthritis. Electronically signed by: Dorethia Molt MD 12/21/2023 02:31 AM EDT RP Workstation: [id number]   CT Cervical Spine Wo Contrast Result Date: 12/20/2023 EXAM: CT CERVICAL SPINE WITHOUT CONTRAST 12/20/2023 09:28:00 PM TECHNIQUE: CT of the cervical spine was performed without the administration of intravenous contrast. Multiplanar reformatted images are provided for review. Automated exposure control, iterative reconstruction, and/or weight based adjustment of the mA/kV was utilized to reduce the radiation dose to as low as reasonably achievable. COMPARISON: None available. CLINICAL HISTORY: Neck trauma (Age >= 65y). Chief complaints; Fall on Blood Thinners; CT Cervical Spine Wo Contrast; Neck trauma (Age >= 65y) FINDINGS: BONES AND ALIGNMENT: No acute fracture or traumatic malalignment. DEGENERATIVE CHANGES: No significant degenerative changes. SOFT TISSUES: No prevertebral soft tissue swelling. IMPRESSION: 1. No acute abnormality of the cervical spine. Electronically signed by: Pinkie Pebbles MD 12/20/2023 09:36 PM EDT RP Workstation: [id number]   CT Head Wo Contrast Result Date: 12/20/2023 EXAM: CT HEAD WITHOUT CONTRAST 12/20/2023 09:11:00 PM TECHNIQUE: CT of the head was performed without the administration of intravenous contrast. Automated exposure control, iterative reconstruction, and/or weight based  adjustment of the mA/kV was utilized to reduce the radiation dose to as low as reasonably achievable. COMPARISON: 04/08/2023 CLINICAL HISTORY: Head trauma, minor (Age >= 65y). Chief complaints; Fall on Blood Thinners; CT Head Wo Contrast; Head trauma, Minor (Age >= 65y). FINDINGS: BRAIN AND VENTRICLES: No acute hemorrhage. No evidence of acute infarct. Small remote lacunar infarct in left thalamus. Periventricular and subcortical white matter hypoattenuation, likely sequela of chronic small vessel ischemic disease. Mild parenchymal volume loss. Remote infarct in right cerebellar hemisphere. No hydrocephalus. No extra-axial collection. No mass effect or midline shift. Intracranial atherosclerosis. ORBITS: Bilateral lens implants. SINUSES: No acute abnormality. SOFT TISSUES AND SKULL: No acute soft tissue abnormality. No skull fracture. IMPRESSION: 1. No acute intracranial abnormality. Electronically signed by: Pinkie Pebbles MD 12/20/2023 09:34 PM EDT RP Workstation: [id number]   DG Pelvis Portable Result Date: 12/20/2023 EXAM: 1 Or 2 View(S) Xray Of The Pelvis 12/20/2023 08:52:44 Pm COMPARISON: None Available. CLINICAL HISTORY: Trauma. Pt Had A Fall From Toilet, Pt [employer] And Landed On Lt Hip/Knee FINDINGS: BONES AND JOINTS: Acute Left Hip Intertrochanteric Fracture And Avulsion Of The Lesser Trochanter. Right Total Hip Arthroplasty. Moderate-To-Severe Degenerative Changes Of The Left Hip. SOFT TISSUES: Surgical Clips In Left Groin Noted. Vascular Calcifications. IMPRESSION: 1. Acute left hip intertrochanteric fracture and avulsion of the lesser trochanter. 2. Right total hip arthroplasty. 3. Moderate-to-severe degenerative changes of the left hip. Electronically signed by: Dorethia Molt MD 12/20/2023 09:16 PM EDT RP Workstation: [id number]   DG Knee Left Port Result Date: 12/20/2023 EXAM: 2 VIEW(S) XRAY OF THE  Knee 12/20/2023 08:52:44 PM COMPARISON: None available. CLINICAL HISTORY: Blunt Trauma.  Pt had a fall from toilet, pt fell forward and  landed on lt hip/knee FINDINGS: BONES AND JOINTS: Mild tricompartmental degenerative changes. No acute fracture. No focal osseous lesion. No joint dislocation. SOFT TISSUES: Vascular calcifications. The soft tissues are unremarkable. IMPRESSION: 1. No acute fracture or dislocation. 2. Mild tricompartmental degenerative changes. Electronically signed by: Dorethia Molt MD 12/20/2023 09:15 PM EDT RP Workstation: [id number]   DG Chest Port 1 View Result Date: 12/20/2023 EXAM: 1 VIEW XRAY OF THE CHEST 12/20/2023 08:54:45 PM COMPARISON: 04/21/2023 CLINICAL HISTORY: Trauma. Pt had a fall from toilet, pt fell forward and landed on lt hip/knee FINDINGS: LUNGS AND PLEURA: Trace interstitial pulmonary edema suspected. No pneumothorax. No pleural effusion. HEART AND MEDIASTINUM: Left pacemaker stable in place. Cardiomegaly, unchanged. Status post coronary artery bypass grafting. BONES AND SOFT TISSUES: Atherosclerotic aortic calcifications. Osseous structures are age appropriate. IMPRESSION: 1. No acute process. 2. Cardiomegaly, unchanged. 3. Trace interstitial pulmonary edema suspected. No pneumothorax or pleural effusion. Electronically signed by: Dorethia Molt MD 12/20/2023 09:14 PM EDT RP Workstation: [id number]   DG FEMUR PORT 1V LEFT Result Date: 12/20/2023 EXAM: 2 Views Xray Of The Left Femur 12/20/2023 08:51:49 Pm COMPARISON: None Available. CLINICAL HISTORY: Blunt Trauma. Pt Had A Fall From Toilet, Pt [employer] And Landed On Lt Hip/Knee FINDINGS: BONES AND JOINTS: Acute Intertrochanteric Fracture. Avulsion Of The Lesser Trochanter. Moderate Degenerative Changes Of Left Hip. Mild Degenerative Changes Of Knee. SOFT TISSUES: Surgical Clips Overlying Left Hip. Partially Visualized Right Hip Arthroplasty. Vascular Calcifications. IMPRESSION: 1. Acute intertrochanteric fracture and avulsion of the lesser trochanter. 2. Moderate degenerative changes of left hip and  mild degenerative changes of knee. Electronically signed by: Dorethia Molt MD 12/20/2023 09:11 PM EDT RP Workstation: [id number]    Labs: BNP (last 3 results) Recent Labs    01/16/23 1019 04/06/23 1744 09/28/23 1752  BNP 499.1* 339.2* 268.8*   Basic Metabolic Panel: Recent Labs  Lab 12/20/23 2034 12/20/23 2038 12/21/23 0601 12/22/23 0600 12/23/23 0518 12/24/23 0406  NA 134* 137 138 136 138 137  K 4.0 4.1 3.9 4.2 4.8 4.3  CL 99 101 103 103 106 106  CO2 24  --  25 23 23 24   GLUCOSE 101* 99 100* 137* 112* 88  BUN 25* 26* 24* 28* 36* 40*  CREATININE 1.35* 1.40* 1.18* 1.37* 1.28* 1.21*  CALCIUM  9.7  --  9.8 9.4 9.6 9.7  MG  --   --  2.2 2.2  --   --   PHOS  --   --  3.0 2.9  --   --    Liver Function Tests: Recent Labs  Lab 12/20/23 2034  AST 50*  ALT 33  ALKPHOS 99  BILITOT 1.8*  PROT 6.7  ALBUMIN 3.4*   No results for input(s): LIPASE, AMYLASE in the last 168 hours. No results for input(s): AMMONIA in the last 168 hours. CBC: Recent Labs  Lab 12/20/23 2034 12/20/23 2038 12/21/23 0601 12/22/23 0600 12/23/23 0518  WBC 7.5  --  6.5 5.8 6.6  HGB 11.0* 11.9* 9.7* 8.5* 8.2*  HCT 34.6* 35.0* 30.5* 26.8* 24.9*  MCV 89.6  --  87.1 87.3 86.2  PLT 159  --  149* 138* 163   CBG: No results for input(s): GLUCAP in the last 168 hours. Hgb A1c No results for input(s): HGBA1C in the last 72 hours. Anemia work up No results for input(s): VITAMINB12, FOLATE, FERRITIN, TIBC, IRON, RETICCTPCT in the last 72 hours. Cardiac Enzymes: No results for input(s): CKTOTAL, CKMB, CKMBINDEX, TROPONINI in the last 168 hours. BNP: Invalid input(s):  POCBNP D-Dimer No results for input(s): DDIMER in the last 72 hours. Lipid Profile No results for input(s): CHOL, HDL, LDLCALC, TRIG, CHOLHDL, LDLDIRECT in the last 72 hours. Thyroid  function studies No results for input(s): TSH, T4TOTAL, T3FREE, THYROIDAB in the last 72  hours.  Invalid input(s): FREET3 Urinalysis    Component Value Date/Time   COLORURINE AMBER (A) 12/20/2023 2239   APPEARANCEUR CLEAR 12/20/2023 2239   LABSPEC 1.006 12/20/2023 2239   PHURINE 7.0 12/20/2023 2239   GLUCOSEU NEGATIVE 12/20/2023 2239   HGBUR NEGATIVE 12/20/2023 2239   BILIRUBINUR NEGATIVE 12/20/2023 2239   KETONESUR NEGATIVE 12/20/2023 2239   PROTEINUR NEGATIVE 12/20/2023 2239   UROBILINOGEN 1.0 10/14/2012 0922   NITRITE POSITIVE (A) 12/20/2023 2239   LEUKOCYTESUR TRACE (A) 12/20/2023 2239   Sepsis Labs Recent Labs  Lab 12/20/23 2034 12/21/23 0601 12/22/23 0600 12/23/23 0518  WBC 7.5 6.5 5.8 6.6   Microbiology Recent Results (from the past 240 hours)  Resp panel by RT-PCR (RSV, Flu A&B, Covid) Anterior Nasal Swab     Status: None   Collection Time: 12/21/23  6:01 AM   Specimen: Anterior Nasal Swab  Result Value Ref Range Status   SARS Coronavirus 2 by RT PCR NEGATIVE NEGATIVE Final   Influenza A by PCR NEGATIVE NEGATIVE Final   Influenza B by PCR NEGATIVE NEGATIVE Final    Comment: (NOTE) The Xpert Xpress SARS-CoV-2/FLU/RSV plus assay is intended as an aid in the diagnosis of influenza from Nasopharyngeal swab specimens and should not be used as a sole basis for treatment. Nasal washings and aspirates are unacceptable for Xpert Xpress SARS-CoV-2/FLU/RSV testing.  Fact Sheet for Patients: bloggercourse.com  Fact Sheet for Healthcare Providers: seriousbroker.it  This test is not yet approved or cleared by the [place]  FDA and has been authorized for detection and/or diagnosis of SARS-CoV-2 by FDA under an Emergency Use Authorization (EUA). This EUA will remain in effect (meaning this test can be used) for the duration of the COVID-19 declaration under Section 564(b)(1) of the Act, 21 U.S.C. section 360bbb-3(b)(1), unless the authorization is terminated or revoked.     Resp Syncytial Virus by  PCR NEGATIVE NEGATIVE Final    Comment: (NOTE) Fact Sheet for Patients: bloggercourse.com  Fact Sheet for Healthcare Providers: seriousbroker.it  This test is not yet approved or cleared by the [place]  FDA and has been authorized for detection and/or diagnosis of SARS-CoV-2 by FDA under an Emergency Use Authorization (EUA). This EUA will remain in effect (meaning this test can be used) for the duration of the COVID-19 declaration under Section 564(b)(1) of the Act, 21 U.S.C. section 360bbb-3(b)(1), unless the authorization is terminated or revoked.  Performed at [facility], [address], [place], [place] [address]      Time coordinating discharge: 35  minutes  SIGNED: Mennie LAMY, MD  [facility] 12/24/2023, 10:48 AM  If 7PM-7AM, please contact night-coverage [email or web address]

## 2023-12-24 NOTE — Evaluation (Signed)
 Occupational Therapy Evaluation Patient Details Name: Stephanie Atkinson MRN: [id number] DOB: 05-18-1924 Today's Date: 12/24/2023   History of Present Illness   Pt is a 89 y.o. female admitted on 12/20/23 with ground level fall onto L side sustaining L femoral intertrochanteric fx, underwent placement of IM rod 10/26. PMH: HTN, HLD, CAD s/p CABG, paroxysmal atrial fibrillation, hypothyroidism, CKD stage 3B, MI, syncope, chronic HFrEF     Clinical Impressions PTA Pt was independent with ADLs and required assistance for IADLs such as grocery shopping, cleaning, and laundry. Pt was Mod I for functional mobility with rollator but reports history of falls. Pt currently requires up to Max A for ADL engagement and Mod A for functional transfers. Pt is primarily limited by LLE pain, generalized weakness, decreased activity tolerance, and unsteadiness on feet. OT to follow Pt acutely to facilitate progress towards goals and maximize functional abilities. Patient will benefit from continued inpatient follow up therapy, <3 hours/day      If plan is discharge home, recommend the following:   A lot of help with walking and/or transfers;A lot of help with bathing/dressing/bathroom;Assistance with cooking/housework;Assist for transportation;Help with stairs or ramp for entrance     Functional Status Assessment   Patient has had a recent decline in their functional status and demonstrates the ability to make significant improvements in function in a reasonable and predictable amount of time.     Equipment Recommendations   BSC/3in1     Recommendations for Other Services         Precautions/Restrictions   Precautions Precautions: Fall Recall of Precautions/Restrictions: Intact Restrictions Weight Bearing Restrictions Per Provider Order: Yes LLE Weight Bearing Per Provider Order: Weight bearing as tolerated     Mobility Bed Mobility Overal bed mobility: Needs Assistance Bed Mobility:  Supine to Sit, Sit to Supine     Supine to sit: Mod assist Sit to supine: Mod assist   General bed mobility comments: Mod A to manage BLE off EOB and HHA to elevate trunk to sitting. Pt able to straighten hips independently with increased time.    Transfers Overall transfer level: Needs assistance Equipment used: Rolling walker (2 wheels) Transfers: Sit to/from Stand Sit to Stand: Mod assist           General transfer comment: Mod A for controlled rise from bed with verbal cues for proepr hand placement on RW and anterior lean to facilitate stand. Pt instructed to take side steps to the R towards HOB. Pt able to shuffle L foot to the R but unable to progress steps on R foot while bearing weight on LLE.      Balance Overall balance assessment: Needs assistance, History of Falls Sitting-balance support: No upper extremity supported, Feet supported Sitting balance-Leahy Scale: Fair     Standing balance support: Bilateral upper extremity supported, During functional activity, Reliant on assistive device for balance Standing balance-Leahy Scale: Poor Standing balance comment: dependent on RW and external support                           ADL either performed or assessed with clinical judgement   ADL Overall ADL's : Needs assistance/impaired Eating/Feeding: Independent   Grooming: Set up;Sitting   Upper Body Bathing: Minimal assistance;Sitting   Lower Body Bathing: Maximal assistance;Sitting/lateral leans   Upper Body Dressing : Set up;Sitting   Lower Body Dressing: Maximal assistance;Sitting/lateral leans   Toilet Transfer: Moderate assistance;Stand-pivot;BSC/3in1   Toileting- Clothing Manipulation and Hygiene: Total  assistance Toileting - Clothing Manipulation Details (indicate cue type and reason): purewick             Vision Patient Visual Report: No change from baseline Vision Assessment?: No apparent visual deficits     Perception          Praxis         Pertinent Vitals/Pain Pain Assessment Pain Assessment: No/denies pain     Extremity/Trunk Assessment Upper Extremity Assessment Upper Extremity Assessment: Generalized weakness;RUE deficits/detail;LUE deficits/detail RUE Deficits / Details: arhtritic deficits observed at shoulders and hands RUE Sensation: WNL RUE Coordination: decreased fine motor;decreased gross motor LUE Deficits / Details: arhtritic deficits observed at shoulders and hands LUE Sensation: WNL LUE Coordination: decreased gross motor;decreased fine motor   Lower Extremity Assessment Lower Extremity Assessment: Defer to PT evaluation   Cervical / Trunk Assessment Cervical / Trunk Assessment: Normal   Communication Communication Communication: No apparent difficulties   Cognition Arousal: Alert Behavior During Therapy: WFL for tasks assessed/performed Cognition: No apparent impairments                               Following commands: Intact       Cueing  General Comments   Cueing Techniques: Verbal cues;Visual cues  Pt pleasant and eager to engage in therapy this morning. Fear of falling evident but easily encouraged.   Exercises     Shoulder Instructions      Home Living Family/patient expects to be discharged to:: Private residence Living Arrangements: Alone Available Help at Discharge: Family;Available PRN/intermittently Type of Home: Apartment Home Access: Elevator     Home Layout: One level     Bathroom Shower/Tub: Producer, Television/film/video: Handicapped height     Home Equipment: Grab bars - tub/shower;Grab bars - toilet;Shower seat;Cane - single point;Rollator (4 wheels);Cane - Programmer, Applications (2 wheels);Adaptive equipment Adaptive Equipment: Reacher;Long-handled shoe horn        Prior Functioning/Environment Prior Level of Function : Needs assist             Mobility Comments: rollator for mobility, has had several falls. States  she does not feel safe using rollator at the moment ADLs Comments: daughter brings groceries, no longer drives, does cook and do light cleaning, assist for washing clothes on Wednesdays for 3 hours    OT Problem List: Decreased strength;Decreased activity tolerance;Impaired balance (sitting and/or standing);Decreased knowledge of use of DME or AE;Impaired UE functional use;Pain   OT Treatment/Interventions: Self-care/ADL training;Therapeutic exercise;DME and/or AE instruction;Therapeutic activities;Balance training;Patient/family education      OT Goals(Current goals can be found in the care plan section)   Acute Rehab OT Goals Patient Stated Goal: to get better OT Goal Formulation: With patient Time For Goal Achievement: 01/07/24 Potential to Achieve Goals: Good ADL Goals Pt Will Perform Lower Body Bathing: with contact guard assist;with adaptive equipment;sitting/lateral leans Pt Will Perform Lower Body Dressing: with min assist;with adaptive equipment;sitting/lateral leans Pt Will Transfer to Toilet: with contact guard assist;bedside commode Pt Will Perform Toileting - Clothing Manipulation and hygiene: with min assist;sitting/lateral leans   OT Frequency:  Min 2X/week    Co-evaluation              AM-PAC OT 6 Clicks Daily Activity     Outcome Measure Help from another person eating meals?: None Help from another person taking care of personal grooming?: A Little Help from another person toileting, which includes using toliet, bedpan,  or urinal?: Total Help from another person bathing (including washing, rinsing, drying)?: A Lot Help from another person to put on and taking off regular upper body clothing?: A Little Help from another person to put on and taking off regular lower body clothing?: A Lot 6 Click Score: 15   End of Session Equipment Utilized During Treatment: Gait belt;Rolling walker (2 wheels)  Activity Tolerance: Patient tolerated treatment  well Patient left: in bed;with call bell/phone within reach;with bed alarm set  OT Visit Diagnosis: Unsteadiness on feet (R26.81);Muscle weakness (generalized) (M62.81);History of falling (Z91.81);Pain Pain - Right/Left: Left Pain - part of body: Leg                Time: 9171-9143 OT Time Calculation (min): 28 min Charges:  OT General Charges $OT Visit: 1 Visit OT Evaluation $OT Eval Moderate Complexity: 1 Mod OT Treatments $Therapeutic Activity: 8-22 mins  Maurilio CROME, OTR/L.  [facility]  Office: [phone]   Maurilio PARAS Cleva Camero 12/24/2023, 10:04 AM

## 2023-12-24 NOTE — Progress Notes (Signed)
 Stephanie Atkinson Age to be D/C'd Skilled nursing facility per MD order.  Discussed with the patient and all questions fully answered.  IV catheter discontinued intact. Site without signs and symptoms of complications. Dressing and pressure applied.  An After Visit Summary was printed and given to [facility] for receiving facility. Signed DNR form placed in packet.  D/c education completed with patient/family including follow up instructions, medication list, d/c activities limitations if indicated, with other d/c instructions as indicated by MD - patient able to verbalize understanding, all questions fully answered.   Attempted to give report to receiving nurse at [facility], she was unable to receive report. Phone number given for call back.   Patient instructed to return to ED, call 911, or call MD for any changes in condition.   Patient escorted via stretcher, and D/C to [facility] via non emergency ambulance.  Stephanie Atkinson 12/24/2023 11:46 AM

## 2023-12-24 NOTE — Care Management Important Message (Signed)
 Important Message  Patient Details  Name: CASHAY MANGANELLI MRN: [id number] Date of Birth: Dec 19, 1924   Important Message Given:  Yes - Medicare IM     Jon Cruel 12/24/2023, 9:30 AM

## 2023-12-25 DIAGNOSIS — I2510 Atherosclerotic heart disease of native coronary artery without angina pectoris: Secondary | ICD-10-CM | POA: Diagnosis not present

## 2023-12-25 DIAGNOSIS — D649 Anemia, unspecified: Secondary | ICD-10-CM | POA: Diagnosis not present

## 2023-12-25 DIAGNOSIS — I4891 Unspecified atrial fibrillation: Secondary | ICD-10-CM | POA: Diagnosis not present

## 2023-12-25 DIAGNOSIS — N183 Chronic kidney disease, stage 3 unspecified: Secondary | ICD-10-CM | POA: Diagnosis not present

## 2023-12-25 DIAGNOSIS — R638 Other symptoms and signs concerning food and fluid intake: Secondary | ICD-10-CM | POA: Diagnosis not present

## 2023-12-29 ENCOUNTER — Encounter: Payer: Self-pay | Admitting: Radiology

## 2024-01-05 DIAGNOSIS — N183 Chronic kidney disease, stage 3 unspecified: Secondary | ICD-10-CM | POA: Diagnosis not present

## 2024-01-05 DIAGNOSIS — I2510 Atherosclerotic heart disease of native coronary artery without angina pectoris: Secondary | ICD-10-CM | POA: Diagnosis not present

## 2024-01-05 DIAGNOSIS — I10 Essential (primary) hypertension: Secondary | ICD-10-CM | POA: Diagnosis not present

## 2024-01-14 DIAGNOSIS — N183 Chronic kidney disease, stage 3 unspecified: Secondary | ICD-10-CM | POA: Diagnosis not present

## 2024-01-14 DIAGNOSIS — I480 Paroxysmal atrial fibrillation: Secondary | ICD-10-CM | POA: Diagnosis not present

## 2024-01-14 DIAGNOSIS — E039 Hypothyroidism, unspecified: Secondary | ICD-10-CM | POA: Diagnosis not present

## 2024-01-14 DIAGNOSIS — I4891 Unspecified atrial fibrillation: Secondary | ICD-10-CM | POA: Diagnosis not present

## 2024-02-05 ENCOUNTER — Encounter

## 2024-02-10 ENCOUNTER — Telehealth: Payer: Self-pay | Admitting: Cardiovascular Disease

## 2024-02-10 NOTE — Telephone Encounter (Signed)
 Spoke with facility. They are requiring copies of patients EP medical records in order to provide her care for her PPM and to set her up with new EP providers in the area.  She lives in [place].   Will forward to our medical records department to follow up with patient/daughter/facility on what is needed.

## 2024-02-10 NOTE — Telephone Encounter (Signed)
 Pt daughter states [facility] requesting information on remote pacer checks. Please advise. Phone for facility [phone] .

## 2024-03-31 ENCOUNTER — Telehealth: Payer: Self-pay | Admitting: Cardiovascular Disease

## 2024-03-31 DIAGNOSIS — I5032 Chronic diastolic (congestive) heart failure: Secondary | ICD-10-CM

## 2024-03-31 DIAGNOSIS — I4821 Permanent atrial fibrillation: Secondary | ICD-10-CM

## 2024-03-31 DIAGNOSIS — Z950 Presence of cardiac pacemaker: Secondary | ICD-10-CM

## 2024-03-31 NOTE — Telephone Encounter (Signed)
 Spoke with patient's daughter [name], confirmed mailing address:  [address] [place] [address]    Referral placed in outgoing mail.  [name] also reports in the move to the ALF, they have misplaced the monitor for patient's PPM. She states she has already notified our device clinic. I will forward this message to them to be sure they are aware.  [name] states she will follow up with new cardiologist once established and will inquire about a new monitor at that time.

## 2024-03-31 NOTE — Telephone Encounter (Signed)
 Pt daughter would like a c/b regarding referral for Gen[facility]Card in [place], [place] due to pt now residing at an ALF in that city. Please advise

## 2024-03-31 NOTE — Telephone Encounter (Signed)
 Sent ambulatory referral to [facility] to Dr. Cordelia Rous to establish cardiology care.  Left VM for [name] Room advising of referral and requesting Pt's monitor be plugged in by Pt's bedside that we may continue to monitor remotely until care is transferred.  Await further needs.

## 2024-03-31 NOTE — Telephone Encounter (Signed)
 Please put in the referral to [facility]. I am afraid I do not know any of the Cardiologists in that area. When asking for an appointment, she should point out that she has a pacemaker and would preferably be seen by someone who also follows pacemakers. [place] and [place] are further away, but definitely have plenty of Cardiologists with pacemaker expertise. While she waits for her appointment we should continue to monitor her pacemaker downloads. She missed the December download, probably because she moved, but if she just plugs in her transmitter next to her bedside in her new location, we should be able to get a transmission.

## 2024-03-31 NOTE — Telephone Encounter (Signed)
 Daughter called back to say the monitor has been misplace do to patient moving. Also, would like a copy of the referral to be mail to her. Please advise

## 2024-05-06 ENCOUNTER — Ambulatory Visit

## 2024-08-05 ENCOUNTER — Ambulatory Visit

## 2024-11-04 ENCOUNTER — Ambulatory Visit

## 2025-02-03 ENCOUNTER — Ambulatory Visit

## 2025-05-05 ENCOUNTER — Ambulatory Visit
